# Patient Record
Sex: Female | Born: 1935
Health system: Southern US, Community
[De-identification: ages and names within clinical notes are randomized; demographics above are authoritative.]

## PROBLEM LIST (undated history)

## (undated) DIAGNOSIS — I499 Cardiac arrhythmia, unspecified: Secondary | ICD-10-CM

## (undated) DIAGNOSIS — R142 Eructation: Secondary | ICD-10-CM

## (undated) DIAGNOSIS — M810 Age-related osteoporosis without current pathological fracture: Secondary | ICD-10-CM

## (undated) DIAGNOSIS — J441 Chronic obstructive pulmonary disease with (acute) exacerbation: Secondary | ICD-10-CM

## (undated) DIAGNOSIS — N259 Disorder resulting from impaired renal tubular function, unspecified: Secondary | ICD-10-CM

## (undated) DIAGNOSIS — H409 Unspecified glaucoma: Secondary | ICD-10-CM

## (undated) DIAGNOSIS — Z8711 Personal history of peptic ulcer disease: Secondary | ICD-10-CM

## (undated) DIAGNOSIS — I10 Essential (primary) hypertension: Secondary | ICD-10-CM

## (undated) DIAGNOSIS — J4489 Other specified chronic obstructive pulmonary disease: Secondary | ICD-10-CM

## (undated) DIAGNOSIS — R0600 Dyspnea, unspecified: Secondary | ICD-10-CM

## (undated) DIAGNOSIS — F17200 Nicotine dependence, unspecified, uncomplicated: Secondary | ICD-10-CM

## (undated) DIAGNOSIS — E119 Type 2 diabetes mellitus without complications: Secondary | ICD-10-CM

## (undated) DIAGNOSIS — R0902 Hypoxemia: Secondary | ICD-10-CM

## (undated) DIAGNOSIS — J449 Chronic obstructive pulmonary disease, unspecified: Secondary | ICD-10-CM

## (undated) DIAGNOSIS — H3530 Unspecified macular degeneration: Secondary | ICD-10-CM

## (undated) HISTORY — DX: Chronic obstructive pulmonary disease with (acute) exacerbation: J44.1

## (undated) HISTORY — PX: ELBOW FRACTURE SURGERY: SHX616

## (undated) HISTORY — DX: Other specified chronic obstructive pulmonary disease: J44.89

## (undated) HISTORY — DX: Unspecified macular degeneration: H35.30

## (undated) HISTORY — DX: Unspecified glaucoma: H40.9

## (undated) HISTORY — DX: Type 2 diabetes mellitus without complications: E11.9

## (undated) HISTORY — DX: Essential (primary) hypertension: I10

## (undated) HISTORY — DX: Nicotine dependence, unspecified, uncomplicated: F17.200

## (undated) HISTORY — DX: Personal history of peptic ulcer disease: Z87.11

## (undated) HISTORY — DX: Chronic obstructive pulmonary disease, unspecified: J44.9

## (undated) HISTORY — DX: Age-related osteoporosis without current pathological fracture: M81.0

## (undated) HISTORY — PX: CATARACT EXTRACTION W/ INTRAOCULAR LENS  IMPLANT, BILATERAL: SHX1307

## (undated) HISTORY — DX: Disorder resulting from impaired renal tubular function, unspecified: N25.9

## (undated) SURGERY — LOWER EXTREMITY ANGIOGRAPHY
Anesthesia: Moderate Sedation | Laterality: Left

---

## 2004-06-13 ENCOUNTER — Encounter: Payer: Self-pay | Admitting: Family Medicine

## 2004-06-13 LAB — CONVERTED CEMR LAB: Pap Smear: NORMAL

## 2006-03-12 ENCOUNTER — Ambulatory Visit: Payer: Self-pay | Admitting: Family Medicine

## 2006-03-28 ENCOUNTER — Ambulatory Visit: Payer: Self-pay | Admitting: Family Medicine

## 2006-06-30 ENCOUNTER — Emergency Department (HOSPITAL_COMMUNITY): Admission: EM | Admit: 2006-06-30 | Discharge: 2006-07-01 | Payer: Self-pay | Admitting: Emergency Medicine

## 2006-11-21 ENCOUNTER — Ambulatory Visit: Payer: Self-pay | Admitting: Family Medicine

## 2006-12-04 ENCOUNTER — Ambulatory Visit: Payer: Self-pay | Admitting: Family Medicine

## 2006-12-04 LAB — CONVERTED CEMR LAB
ALT: 30 units/L (ref 0–40)
AST: 27 units/L (ref 0–37)
Albumin: 4.3 g/dL (ref 3.5–5.2)
Alkaline Phosphatase: 50 units/L (ref 39–117)
BUN: 28 mg/dL — ABNORMAL HIGH (ref 6–23)
Bilirubin, Direct: 0.1 mg/dL (ref 0.0–0.3)
CO2: 29 meq/L (ref 19–32)
Calcium: 9.7 mg/dL (ref 8.4–10.5)
Chloride: 107 meq/L (ref 96–112)
Cholesterol: 218 mg/dL (ref 0–200)
Creatinine, Ser: 1.3 mg/dL — ABNORMAL HIGH (ref 0.4–1.2)
Direct LDL: 75.3 mg/dL
GFR calc Af Amer: 52 mL/min
GFR calc non Af Amer: 43 mL/min
Glucose, Bld: 128 mg/dL — ABNORMAL HIGH (ref 70–99)
HDL: 107.1 mg/dL (ref >39.0)
Phosphorus: 4.9 mg/dL — ABNORMAL HIGH (ref 2.3–4.6)
Potassium: 4.7 meq/L (ref 3.5–5.1)
Sodium: 142 meq/L (ref 135–145)
Total Bilirubin: 0.6 mg/dL (ref 0.3–1.2)
Total CHOL/HDL Ratio: 2
Total Protein: 6.6 g/dL (ref 6.0–8.3)
Triglycerides: 54 mg/dL (ref 0–149)
VLDL: 11 mg/dL (ref 0–40)

## 2006-12-10 ENCOUNTER — Encounter: Payer: Self-pay | Admitting: Family Medicine

## 2006-12-10 DIAGNOSIS — H409 Unspecified glaucoma: Secondary | ICD-10-CM | POA: Insufficient documentation

## 2006-12-10 DIAGNOSIS — J432 Centrilobular emphysema: Secondary | ICD-10-CM

## 2006-12-10 DIAGNOSIS — M810 Age-related osteoporosis without current pathological fracture: Secondary | ICD-10-CM

## 2006-12-10 DIAGNOSIS — H3530 Unspecified macular degeneration: Secondary | ICD-10-CM | POA: Insufficient documentation

## 2006-12-10 DIAGNOSIS — I10 Essential (primary) hypertension: Secondary | ICD-10-CM | POA: Insufficient documentation

## 2006-12-11 ENCOUNTER — Ambulatory Visit: Payer: Self-pay | Admitting: Family Medicine

## 2006-12-11 DIAGNOSIS — J441 Chronic obstructive pulmonary disease with (acute) exacerbation: Secondary | ICD-10-CM

## 2006-12-11 DIAGNOSIS — N259 Disorder resulting from impaired renal tubular function, unspecified: Secondary | ICD-10-CM | POA: Insufficient documentation

## 2006-12-11 DIAGNOSIS — R7303 Prediabetes: Secondary | ICD-10-CM | POA: Insufficient documentation

## 2006-12-12 LAB — CONVERTED CEMR LAB
Creatinine,U: 25.1 mg/dL
Hgb A1c MFr Bld: 6 % (ref 4.6–6.0)
Microalb Creat Ratio: 8 mg/g (ref 0.0–30.0)
Microalb, Ur: 0.2 mg/dL (ref 0.0–1.9)

## 2006-12-18 ENCOUNTER — Telehealth: Payer: Self-pay | Admitting: Family Medicine

## 2007-03-24 ENCOUNTER — Ambulatory Visit: Payer: Self-pay | Admitting: Family Medicine

## 2007-03-25 LAB — CONVERTED CEMR LAB
BUN: 22 mg/dL (ref 6–23)
CO2: 28 meq/L (ref 19–32)
Calcium: 9.4 mg/dL (ref 8.4–10.5)
Chloride: 103 meq/L (ref 96–112)
Creatinine, Ser: 1.1 mg/dL (ref 0.4–1.2)
GFR calc Af Amer: 63 mL/min
GFR calc non Af Amer: 52 mL/min
Glucose, Bld: 121 mg/dL — ABNORMAL HIGH (ref 70–99)
Hgb A1c MFr Bld: 5.7 % (ref 4.6–6.0)
Potassium: 5 meq/L (ref 3.5–5.1)
Sodium: 140 meq/L (ref 135–145)

## 2007-03-27 ENCOUNTER — Encounter (INDEPENDENT_AMBULATORY_CARE_PROVIDER_SITE_OTHER): Payer: Self-pay | Admitting: *Deleted

## 2007-04-01 ENCOUNTER — Ambulatory Visit: Payer: Self-pay | Admitting: Family Medicine

## 2007-05-20 ENCOUNTER — Telehealth: Payer: Self-pay | Admitting: Family Medicine

## 2007-05-27 ENCOUNTER — Ambulatory Visit: Payer: Self-pay | Admitting: Family Medicine

## 2007-06-20 ENCOUNTER — Telehealth: Payer: Self-pay | Admitting: Family Medicine

## 2007-06-23 ENCOUNTER — Ambulatory Visit: Payer: Self-pay | Admitting: Family Medicine

## 2007-06-25 ENCOUNTER — Ambulatory Visit: Payer: Self-pay | Admitting: Family Medicine

## 2007-09-24 ENCOUNTER — Telehealth: Payer: Self-pay | Admitting: Family Medicine

## 2007-09-25 ENCOUNTER — Ambulatory Visit: Payer: Self-pay | Admitting: Family Medicine

## 2007-09-25 LAB — CONVERTED CEMR LAB: Hgb A1c MFr Bld: 5.9 % (ref 4.6–6.0)

## 2007-09-26 ENCOUNTER — Telehealth: Payer: Self-pay | Admitting: Family Medicine

## 2007-09-30 ENCOUNTER — Ambulatory Visit: Payer: Self-pay | Admitting: Family Medicine

## 2007-12-15 ENCOUNTER — Telehealth (INDEPENDENT_AMBULATORY_CARE_PROVIDER_SITE_OTHER): Payer: Self-pay | Admitting: *Deleted

## 2008-01-01 ENCOUNTER — Encounter: Payer: Self-pay | Admitting: Family Medicine

## 2008-01-07 ENCOUNTER — Ambulatory Visit: Payer: Self-pay | Admitting: Family Medicine

## 2008-01-07 ENCOUNTER — Telehealth (INDEPENDENT_AMBULATORY_CARE_PROVIDER_SITE_OTHER): Payer: Self-pay | Admitting: *Deleted

## 2008-01-07 DIAGNOSIS — B029 Zoster without complications: Secondary | ICD-10-CM | POA: Insufficient documentation

## 2008-03-24 ENCOUNTER — Ambulatory Visit: Payer: Self-pay | Admitting: Family Medicine

## 2008-03-24 DIAGNOSIS — E7800 Pure hypercholesterolemia, unspecified: Secondary | ICD-10-CM | POA: Insufficient documentation

## 2008-03-24 LAB — CONVERTED CEMR LAB
ALT: 41 units/L — ABNORMAL HIGH (ref 0–35)
AST: 31 units/L (ref 0–37)
Albumin: 4.2 g/dL (ref 3.5–5.2)
Alkaline Phosphatase: 48 units/L (ref 39–117)
BUN: 20 mg/dL (ref 6–23)
Bilirubin, Direct: 0.1 mg/dL (ref 0.0–0.3)
CO2: 28 meq/L (ref 19–32)
Calcium: 9.5 mg/dL (ref 8.4–10.5)
Chloride: 105 meq/L (ref 96–112)
Cholesterol: 226 mg/dL (ref 0–200)
Creatinine, Ser: 0.9 mg/dL (ref 0.4–1.2)
Creatinine,U: 164 mg/dL
Direct LDL: 77.9 mg/dL
GFR calc Af Amer: 79 mL/min
GFR calc non Af Amer: 66 mL/min
Glucose, Bld: 113 mg/dL — ABNORMAL HIGH (ref 70–99)
HDL: 122.3 mg/dL (ref >39.0)
Hgb A1c MFr Bld: 6.1 % — ABNORMAL HIGH (ref 4.6–6.0)
Microalb Creat Ratio: 6.7 mg/g (ref 0.0–30.0)
Microalb, Ur: 1.1 mg/dL (ref 0.0–1.9)
Potassium: 4.7 meq/L (ref 3.5–5.1)
Sodium: 141 meq/L (ref 135–145)
Total Bilirubin: 1 mg/dL (ref 0.3–1.2)
Total CHOL/HDL Ratio: 1.8
Total Protein: 6.2 g/dL (ref 6.0–8.3)
Triglycerides: 31 mg/dL (ref 0–149)
VLDL: 6 mg/dL (ref 0–40)

## 2008-03-30 ENCOUNTER — Ambulatory Visit: Payer: Self-pay | Admitting: Family Medicine

## 2008-04-05 ENCOUNTER — Telehealth: Payer: Self-pay | Admitting: Family Medicine

## 2008-04-06 ENCOUNTER — Telehealth: Payer: Self-pay | Admitting: Family Medicine

## 2008-04-08 ENCOUNTER — Ambulatory Visit: Payer: Self-pay | Admitting: Family Medicine

## 2008-04-08 LAB — CONVERTED CEMR LAB
OCCULT 1: NEGATIVE
OCCULT 2: NEGATIVE
OCCULT 3: NEGATIVE

## 2008-04-15 ENCOUNTER — Encounter: Payer: Self-pay | Admitting: Family Medicine

## 2008-04-28 ENCOUNTER — Encounter: Payer: Self-pay | Admitting: Family Medicine

## 2008-05-11 ENCOUNTER — Encounter (INDEPENDENT_AMBULATORY_CARE_PROVIDER_SITE_OTHER): Payer: Self-pay | Admitting: *Deleted

## 2008-08-17 ENCOUNTER — Telehealth: Payer: Self-pay | Admitting: Family Medicine

## 2008-08-23 ENCOUNTER — Telehealth: Payer: Self-pay | Admitting: Family Medicine

## 2008-09-14 ENCOUNTER — Encounter (INDEPENDENT_AMBULATORY_CARE_PROVIDER_SITE_OTHER): Payer: Self-pay | Admitting: *Deleted

## 2008-09-29 ENCOUNTER — Ambulatory Visit: Payer: Self-pay | Admitting: Family Medicine

## 2008-09-30 LAB — CONVERTED CEMR LAB
ALT: 29 units/L (ref 0–35)
AST: 27 units/L (ref 0–37)
Albumin: 4.4 g/dL (ref 3.5–5.2)
Alkaline Phosphatase: 53 units/L (ref 39–117)
BUN: 23 mg/dL (ref 6–23)
Bilirubin, Direct: 0.1 mg/dL (ref 0.0–0.3)
CO2: 29 meq/L (ref 19–32)
Calcium: 10.1 mg/dL (ref 8.4–10.5)
Chloride: 103 meq/L (ref 96–112)
Cholesterol: 248 mg/dL (ref 0–200)
Creatinine, Ser: 0.9 mg/dL (ref 0.4–1.2)
Direct LDL: 74.7 mg/dL
GFR calc Af Amer: 79 mL/min
GFR calc non Af Amer: 65 mL/min
Glucose, Bld: 98 mg/dL (ref 70–99)
HDL: 139.1 mg/dL (ref >39.0)
Hgb A1c MFr Bld: 6 % (ref 4.6–6.0)
Potassium: 4.8 meq/L (ref 3.5–5.1)
Sodium: 142 meq/L (ref 135–145)
Total Bilirubin: 1.1 mg/dL (ref 0.3–1.2)
Total Protein: 7 g/dL (ref 6.0–8.3)
Triglycerides: 39 mg/dL (ref 0–149)
VLDL: 8 mg/dL (ref 0–40)

## 2008-10-04 ENCOUNTER — Ambulatory Visit: Payer: Self-pay | Admitting: Family Medicine

## 2008-10-07 ENCOUNTER — Telehealth: Payer: Self-pay | Admitting: Family Medicine

## 2008-10-13 ENCOUNTER — Telehealth: Payer: Self-pay | Admitting: Family Medicine

## 2008-11-29 ENCOUNTER — Telehealth: Payer: Self-pay | Admitting: Family Medicine

## 2008-12-21 ENCOUNTER — Telehealth: Payer: Self-pay | Admitting: Family Medicine

## 2009-01-05 ENCOUNTER — Encounter: Payer: Self-pay | Admitting: Family Medicine

## 2009-03-02 ENCOUNTER — Emergency Department: Payer: Self-pay | Admitting: Emergency Medicine

## 2009-03-04 ENCOUNTER — Ambulatory Visit: Payer: Self-pay

## 2009-03-04 ENCOUNTER — Encounter: Payer: Self-pay | Admitting: Family Medicine

## 2009-03-09 ENCOUNTER — Ambulatory Visit: Payer: Self-pay | Admitting: General Practice

## 2009-04-15 ENCOUNTER — Ambulatory Visit: Payer: Self-pay | Admitting: Family Medicine

## 2009-04-18 LAB — CONVERTED CEMR LAB
ALT: 26 units/L (ref 0–35)
AST: 30 units/L (ref 0–37)
Albumin: 4.4 g/dL (ref 3.5–5.2)
Alkaline Phosphatase: 53 units/L (ref 39–117)
BUN: 23 mg/dL (ref 6–23)
Bilirubin, Direct: 0 mg/dL (ref 0.0–0.3)
CO2: 30 meq/L (ref 19–32)
Calcium: 9.7 mg/dL (ref 8.4–10.5)
Chloride: 105 meq/L (ref 96–112)
Cholesterol: 227 mg/dL — ABNORMAL HIGH (ref 0–200)
Creatinine, Ser: 1 mg/dL (ref 0.4–1.2)
Creatinine,U: 91.5 mg/dL
Direct LDL: 75.2 mg/dL
GFR calc non Af Amer: 57.78 mL/min (ref >60)
Glucose, Bld: 110 mg/dL — ABNORMAL HIGH (ref 70–99)
HDL: 126.9 mg/dL (ref >39.00)
Hgb A1c MFr Bld: 6 % (ref 4.6–6.5)
Microalb Creat Ratio: 1.1 mg/g (ref 0.0–30.0)
Microalb, Ur: 0.1 mg/dL (ref 0.0–1.9)
Potassium: 5.4 meq/L — ABNORMAL HIGH (ref 3.5–5.1)
Sodium: 141 meq/L (ref 135–145)
Total Bilirubin: 1 mg/dL (ref 0.3–1.2)
Total CHOL/HDL Ratio: 2
Total Protein: 6.6 g/dL (ref 6.0–8.3)
Triglycerides: 38 mg/dL (ref 0.0–149.0)
VLDL: 7.6 mg/dL (ref 0.0–40.0)

## 2009-05-04 ENCOUNTER — Ambulatory Visit: Payer: Self-pay | Admitting: Family Medicine

## 2009-05-04 DIAGNOSIS — E875 Hyperkalemia: Secondary | ICD-10-CM | POA: Insufficient documentation

## 2009-05-04 LAB — CONVERTED CEMR LAB
Cholesterol, target level: 200 mg/dL
HDL goal, serum: 40 mg/dL
LDL Goal: 100 mg/dL

## 2009-05-05 LAB — CONVERTED CEMR LAB
BUN: 22 mg/dL (ref 6–23)
CO2: 31 meq/L (ref 19–32)
Calcium: 10.1 mg/dL (ref 8.4–10.5)
Chloride: 105 meq/L (ref 96–112)
Creatinine, Ser: 0.9 mg/dL (ref 0.4–1.2)
GFR calc non Af Amer: 65.25 mL/min (ref >60)
Glucose, Bld: 91 mg/dL (ref 70–99)
Potassium: 4.6 meq/L (ref 3.5–5.1)
Sodium: 142 meq/L (ref 135–145)

## 2009-05-20 ENCOUNTER — Ambulatory Visit: Payer: Self-pay | Admitting: Family Medicine

## 2009-05-24 LAB — FECAL OCCULT BLOOD, GUAIAC: Fecal Occult Blood: NEGATIVE

## 2009-05-24 LAB — CONVERTED CEMR LAB: Fecal Occult Bld: NEGATIVE

## 2009-08-27 ENCOUNTER — Ambulatory Visit: Payer: Self-pay | Admitting: Internal Medicine

## 2009-08-27 DIAGNOSIS — L02419 Cutaneous abscess of limb, unspecified: Secondary | ICD-10-CM

## 2009-08-27 DIAGNOSIS — L03119 Cellulitis of unspecified part of limb: Secondary | ICD-10-CM

## 2009-11-07 ENCOUNTER — Ambulatory Visit: Payer: Self-pay

## 2009-11-10 ENCOUNTER — Ambulatory Visit: Payer: Self-pay | Admitting: Family Medicine

## 2009-11-11 ENCOUNTER — Telehealth: Payer: Self-pay | Admitting: Family Medicine

## 2010-02-02 ENCOUNTER — Ambulatory Visit: Payer: Self-pay | Admitting: Ophthalmology

## 2010-02-07 ENCOUNTER — Ambulatory Visit: Payer: Self-pay | Admitting: Ophthalmology

## 2010-04-18 ENCOUNTER — Telehealth: Payer: Self-pay | Admitting: Family Medicine

## 2010-04-21 ENCOUNTER — Ambulatory Visit: Payer: Self-pay | Admitting: Family Medicine

## 2010-04-22 LAB — CONVERTED CEMR LAB
ALT: 36 units/L — ABNORMAL HIGH (ref 0–35)
AST: 34 units/L (ref 0–37)
BUN: 20 mg/dL (ref 6–23)
CO2: 24 meq/L (ref 19–32)
Chloride: 106 meq/L (ref 96–112)
Direct LDL: 84.3 mg/dL
Glucose, Bld: 106 mg/dL — ABNORMAL HIGH (ref 70–99)
Hgb A1c MFr Bld: 6.1 % (ref 4.6–6.5)
Potassium: 4.5 meq/L (ref 3.5–5.1)
Total Bilirubin: 0.9 mg/dL (ref 0.3–1.2)
VLDL: 8.2 mg/dL (ref 0.0–40.0)

## 2010-04-26 ENCOUNTER — Ambulatory Visit: Payer: Self-pay | Admitting: Family Medicine

## 2010-04-26 DIAGNOSIS — R809 Proteinuria, unspecified: Secondary | ICD-10-CM | POA: Insufficient documentation

## 2010-04-28 ENCOUNTER — Telehealth: Payer: Self-pay | Admitting: Family Medicine

## 2010-05-03 ENCOUNTER — Ambulatory Visit: Payer: Self-pay | Admitting: Family Medicine

## 2010-05-04 ENCOUNTER — Ambulatory Visit: Payer: Self-pay | Admitting: Family Medicine

## 2010-05-04 LAB — CONVERTED CEMR LAB
BUN: 23 mg/dL (ref 6–23)
CO2: 28 meq/L (ref 19–32)
Chloride: 104 meq/L (ref 96–112)
Chloride: 105 meq/L (ref 96–112)
Creatinine, Ser: 1 mg/dL (ref 0.4–1.2)
GFR calc non Af Amer: 63.44 mL/min (ref >60)
Glucose, Bld: 97 mg/dL (ref 70–99)
Potassium: 4.9 meq/L (ref 3.5–5.1)

## 2010-06-23 ENCOUNTER — Telehealth: Payer: Self-pay | Admitting: Family Medicine

## 2010-06-23 ENCOUNTER — Ambulatory Visit: Payer: Self-pay | Admitting: Internal Medicine

## 2010-06-29 ENCOUNTER — Telehealth: Payer: Self-pay | Admitting: Family Medicine

## 2010-08-18 ENCOUNTER — Encounter: Payer: Self-pay | Admitting: Family Medicine

## 2010-08-18 ENCOUNTER — Inpatient Hospital Stay (HOSPITAL_COMMUNITY)
Admission: EM | Admit: 2010-08-18 | Discharge: 2010-08-19 | Payer: Self-pay | Source: Home / Self Care | Attending: Internal Medicine | Admitting: Internal Medicine

## 2010-08-18 ENCOUNTER — Ambulatory Visit
Admission: RE | Admit: 2010-08-18 | Discharge: 2010-08-18 | Payer: Self-pay | Source: Home / Self Care | Attending: Family Medicine | Admitting: Family Medicine

## 2010-08-18 LAB — APTT: aPTT: 30 seconds (ref 24–37)

## 2010-08-18 LAB — POCT CARDIAC MARKERS
CKMB, poc: <1 ng/mL — ABNORMAL LOW (ref 1.0–8.0)
Myoglobin, poc: 183 ng/mL (ref 12–200)
Troponin i, poc: 0.28 ng/mL (ref 0.00–0.09)

## 2010-08-18 LAB — BASIC METABOLIC PANEL
BUN: 25 mg/dL — ABNORMAL HIGH (ref 6–23)
CO2: 21 mEq/L (ref 19–32)
Calcium: 9.2 mg/dL (ref 8.4–10.5)
Chloride: 103 mEq/L (ref 96–112)
Creatinine, Ser: 1.8 mg/dL — ABNORMAL HIGH (ref 0.4–1.2)
GFR calc Af Amer: 33 mL/min — ABNORMAL LOW (ref >60)
GFR calc non Af Amer: 28 mL/min — ABNORMAL LOW (ref >60)
Glucose, Bld: 176 mg/dL — ABNORMAL HIGH (ref 70–99)
Potassium: 4 mEq/L (ref 3.5–5.1)
Sodium: 137 mEq/L (ref 135–145)

## 2010-08-18 LAB — TROPONIN I: Troponin I: 0.03 ng/mL (ref 0.00–0.06)

## 2010-08-18 LAB — CK TOTAL AND CKMB (NOT AT ARMC)
CK, MB: 1.3 ng/mL (ref 0.3–4.0)
Relative Index: INVALID (ref 0.0–2.5)
Total CK: 38 U/L (ref 7–177)

## 2010-08-18 LAB — MAGNESIUM: Magnesium: 1.9 mg/dL (ref 1.5–2.5)

## 2010-08-18 LAB — PROTIME-INR
INR: 1.16 (ref 0.00–1.49)
Prothrombin Time: 15 seconds (ref 11.6–15.2)

## 2010-08-18 LAB — BRAIN NATRIURETIC PEPTIDE: Pro B Natriuretic peptide (BNP): 69 pg/mL (ref 0.0–100.0)

## 2010-08-25 ENCOUNTER — Ambulatory Visit: Admit: 2010-08-25 | Payer: Self-pay | Admitting: Family Medicine

## 2010-08-25 ENCOUNTER — Ambulatory Visit
Admission: RE | Admit: 2010-08-25 | Discharge: 2010-08-25 | Payer: Self-pay | Source: Home / Self Care | Attending: Family Medicine | Admitting: Family Medicine

## 2010-08-25 DIAGNOSIS — J189 Pneumonia, unspecified organism: Secondary | ICD-10-CM | POA: Insufficient documentation

## 2010-08-25 DIAGNOSIS — J159 Unspecified bacterial pneumonia: Secondary | ICD-10-CM | POA: Insufficient documentation

## 2010-08-28 ENCOUNTER — Telehealth: Payer: Self-pay | Admitting: Family Medicine

## 2010-08-28 LAB — BASIC METABOLIC PANEL
BUN: 22 mg/dL (ref 6–23)
CO2: 21 mEq/L (ref 19–32)
Calcium: 8.7 mg/dL (ref 8.4–10.5)
Chloride: 108 mEq/L (ref 96–112)
Creatinine, Ser: 1.29 mg/dL — ABNORMAL HIGH (ref 0.4–1.2)
GFR calc Af Amer: 49 mL/min — ABNORMAL LOW (ref >60)
GFR calc non Af Amer: 40 mL/min — ABNORMAL LOW (ref >60)
Glucose, Bld: 189 mg/dL — ABNORMAL HIGH (ref 70–99)
Potassium: 4 mEq/L (ref 3.5–5.1)
Sodium: 140 mEq/L (ref 135–145)

## 2010-08-28 LAB — CBC
HCT: 37.7 % (ref 36.0–46.0)
Hemoglobin: 12.7 g/dL (ref 12.0–15.0)
MCH: 30.8 pg (ref 26.0–34.0)
MCHC: 33.7 g/dL (ref 30.0–36.0)
MCV: 91.5 fL (ref 78.0–100.0)
Platelets: 166 10*3/uL (ref 150–400)
RBC: 4.12 MIL/uL (ref 3.87–5.11)
RDW: 13.4 % (ref 11.5–15.5)
WBC: 5.2 10*3/uL (ref 4.0–10.5)

## 2010-08-28 LAB — LIPID PANEL
Cholesterol: 174 mg/dL (ref 0–200)
HDL: 122 mg/dL (ref >39)
LDL Cholesterol: 43 mg/dL (ref 0–99)
Total CHOL/HDL Ratio: 1.4 RATIO
Triglycerides: 47 mg/dL (ref <150)
VLDL: 9 mg/dL (ref 0–40)

## 2010-08-28 LAB — DIFFERENTIAL
Basophils Absolute: 0 10*3/uL (ref 0.0–0.1)
Basophils Relative: 0 % (ref 0–1)
Eosinophils Absolute: 0 10*3/uL (ref 0.0–0.7)
Eosinophils Relative: 0 % (ref 0–5)
Lymphocytes Relative: 15 % (ref 12–46)
Lymphs Abs: 0.8 10*3/uL (ref 0.7–4.0)
Monocytes Absolute: 0.3 10*3/uL (ref 0.1–1.0)
Monocytes Relative: 6 % (ref 3–12)
Neutro Abs: 4.1 10*3/uL (ref 1.7–7.7)
Neutrophils Relative %: 79 % — ABNORMAL HIGH (ref 43–77)

## 2010-08-28 LAB — CARDIAC PANEL(CRET KIN+CKTOT+MB+TROPI)
CK, MB: 2 ng/mL (ref 0.3–4.0)
CK, MB: 2.3 ng/mL (ref 0.3–4.0)
Relative Index: INVALID (ref 0.0–2.5)
Relative Index: INVALID (ref 0.0–2.5)
Total CK: 36 U/L (ref 7–177)
Total CK: 32 U/L (ref 7–177)
Troponin I: 0.02 ng/mL (ref 0.00–0.06)
Troponin I: 0.04 ng/mL (ref 0.00–0.06)

## 2010-08-28 LAB — URINE CULTURE
Colony Count: 8000
Culture  Setup Time: 201201061742

## 2010-08-28 LAB — URINALYSIS, ROUTINE W REFLEX MICROSCOPIC
Hgb urine dipstick: NEGATIVE
Ketones, ur: NEGATIVE mg/dL
Nitrite: POSITIVE — AB
Protein, ur: NEGATIVE mg/dL
Specific Gravity, Urine: 1.021 (ref 1.005–1.030)
Urine Glucose, Fasting: NEGATIVE mg/dL
Urobilinogen, UA: 0.2 mg/dL (ref 0.0–1.0)
pH: 5 (ref 5.0–8.0)

## 2010-08-28 LAB — URINE MICROSCOPIC-ADD ON

## 2010-09-01 ENCOUNTER — Ambulatory Visit: Admit: 2010-09-01 | Payer: Self-pay | Admitting: Family Medicine

## 2010-09-02 NOTE — Consult Note (Signed)
NAMETALONDA, ARTIST                ACCOUNT NO.:  [id number]  MEDICAL RECORD NO.:  [id number]          PATIENT TYPE:  INP  LOCATION:  4739                         FACILITY:  [facility]  PHYSICIAN:  Luis Abed, MD, FACCDATE OF BIRTH:  21-Mar-1934  DATE OF CONSULTATION:  08/18/2010 DATE OF DISCHARGE:                                CONSULTATION   PRIMARY CARE PHYSICIAN:  Kerby Nora, MD  PRIMARY CARDIOLOGIST:  None.  CHIEF COMPLAINT:  Shortness of breath.  HISTORY OF PRESENT ILLNESS:  Misty Ferguson is a 77 year old female with no history of coronary artery disease.  She reports a recent increase in dyspnea on exertion.  She has not had shortness of breath until the last couple of days.  She has noted an upper respiratory infection with sore throat, nasal congestion, weakness, and a cough with light yellow sputum.  She has not been wheezing.  Last p.m., her shortness of breath became worse and she felt like she was wheezing, so she used a nebulizer with slight improvement.  Today, she went to her primary care physician's office and was initially noted to have low O2 saturation. The patient states that she and her husband were still in the office, waiting for home O2 to be set up when she developed presyncope.  She felt extremely weak.  She was rechecked and her blood pressure was low and her O2 saturation was lower as well.  EMS was called and she was treated with oxygen and IV fluids.  In the office, her O2 saturation was 82% on room air.  Currently, she feels much better after IV fluids and on oxygen.  She has no history of chest pain.  PAST MEDICAL HISTORY: 1. Borderline diabetes. 2. Mild chronic renal insufficiency, stage III. 3. COPD. 4. Remote history of tobacco use. 5. Hypertension. 6. Osteoporosis. 7. Remote history of peptic ulcer disease. 8. Macular degeneration and glaucoma.  SURGICAL HISTORY:  She is status post cataract surgery as well as elbow repair after an injury  and tonsillectomy as a child.  ALLERGIES:  No known drug allergies.  CURRENT MEDICATIONS: 1. Fosamax weekly. 2. Spiriva daily. 3. Advair 250/50 b.i.d. 4. Fish oil 1000 mg a day. 5. Ocuvite 50+ daily. 6. Aspirin 81 mg a day. 7. Calcium plus D b.i.d. 8. Albuterol nebulizers p.r.n. 9. Ocuvite eye drops 6-8 times a day. 10.Preservation two tablets daily. 11.Losartan 50 mg a day.  SOCIAL HISTORY:  She lives in [place] with her husband.  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  She has a greater than 50-pack-year history of tobacco use, but quit approximately 6 years ago. She drinks two vodka drinks a day.  She denies drug use.  FAMILY HISTORY:  Not available because she was adopted and has never gotten information on her biological parents.  REVIEW OF SYSTEMS:  Essentially described above.  She has occasional arthralgias.  She has not had fevers, chills or sweats.  She has been coughing, but not wheezing a great deal more than usual and this is controlled by her normal medications.  She has been weak, but has  not had numbness.  She has occasional arthralgias and joint pains.  She denies melena and is not having ongoing reflux symptoms.  Full 14-point review of systems is otherwise negative except as stated in the HPI.  PHYSICAL EXAMINATION:  VITAL SIGNS:  Temperature 98.0, blood pressure 110/47, pulse 92, respiratory rate 20, O2 saturation 95% on 2 liters. GENERAL:  She is a slender, well-developed white female and in no acute distress on O2. HEENT:  Normal. NECK:  There is no lymphadenopathy, thyromegaly, bruit or JVD noted. CV:  Her heart is regular in rate and rhythm with an S1-S2, and no significant murmur, rub or gallop is noted.  Distal pulses are intact in all four extremities. LUNGS:  She has rales bilaterally, right greater than left especially in the bases. SKIN:  She has some chronic changes, but no rashes or acute lesions are noted. ABDOMEN:   Soft and nontender with active bowel sounds. EXTREMITIES:  There is no cyanosis, clubbing or edema noted. MUSCULOSKELETAL:  There is no joint deformity or effusions and no spine or CVA tenderness. NEUROLOGIC:  She is alert and oriented with cranial nerves II through XII grossly intact.  Chest x-ray is pending.  LABORATORY VALUES:  BNP is 69.  Sodium 137, potassium 4.0, chloride 103, CO2 21, BUN 25, creatinine 1.8, glucose 176, magnesium 1.9.  Point-of- care markers show myoglobin 183, MB less than 1.0, troponin-I 0.28.  CK- MB then performed showed a CK of 38 with an MB of 1.3 and a troponin of 0.03.  INR 1.16.  PTT 30.  ABG and CBC with diff is pending.  EKG, is sinus rhythm with a right bundle-branch block.  There was concern for acute ST abnormalities with this, but upon further review of an EKG obtained from June 2011, the right bundle branch block is not an acute change and the ST segments also have no significant changes noted.  IMPRESSION: 1. Misty Ferguson was seen today by Dr. Myrtis Ser, the patient evaluated and the     data reviewed.  She has a right bundle-branch block of unknown     duration, but it has been present for at least 6 months. 2. Chronic obstructive pulmonary disease. 3. Elevated point-of-care markers with a regular troponin within     normal limits.  We feel that her symptoms are not secondary to an acute myocardial infarction, but she has significant cardiac risk factors.  PLAN: 1. Follow her rhythm carefully. 2. Get an old EKG which has been done and shows a right bundle-branch     block. 3. Continue to cycle enzymes.  We will also check an echocardiogram     with further evaluation and treatment depending on the results of     the above testing.   The ER physician has been advised of all of     this and is in agreement with the plan of care.  We will continue     to follow her closely.     Theodore Demark,  PA-C   ______________________________ Luis Abed, MD, [facility]    RB/MEDQ  D:  08/18/2010  T:  08/19/2010  Job:  [id number]  Electronically Signed by Theodore Demark PA-C on 09/01/2010 06:51:30 AM Electronically Signed by Willa Rough MD FACC on 09/02/2010 09:10:20 AM

## 2010-09-05 ENCOUNTER — Ambulatory Visit
Admission: RE | Admit: 2010-09-05 | Discharge: 2010-09-05 | Payer: Self-pay | Source: Home / Self Care | Attending: Family Medicine | Admitting: Family Medicine

## 2010-09-11 ENCOUNTER — Encounter: Payer: Self-pay | Admitting: Family Medicine

## 2010-09-12 NOTE — Assessment & Plan Note (Signed)
Summary: acute copd excerbation   Vital Signs:  Patient profile:   77 year old female Weight:      109.75 pounds O2 Sat:      92 % on Room air Temp:     98.2 degrees F oral Pulse rate:   104 / minute Pulse rhythm:   regular BP sitting:   122 / 82  (left arm) Cuff size:   regular  Vitals Entered By: Selena Batten Dance CMA (AAMA) (June 23, 2010 2:05 PM)  O2 Flow:  Room air CC: COPD exacerbation?   History of Present Illness: CC: COPD exac?  2d h/o increased cough, increased sputum production, clear sputum, increased dyspnea.  No fevers/chills.  No CP/tightness, abd pain, n/v, rashes, myalgias.  h/o COPD x 14 years, on advair and spiriva.  does have alb neb but doesn't notice much improvement.  Last exac was about 1 year ago.  wishes had oxygen at home just in case.  had o/n pulse ox which showed lowest sat drop to 84%, so would qualify for home O2.  No smokers at home.  Current Medications (verified): 1)  Fosamax 70 Mg Tabs (Alendronate Sodium) .... Take 1 Tablet By Mouth Once A Week 2)  Spiriva Handihaler 18 Mcg Caps (Tiotropium Bromide Monohydrate) .... Inhale 1 Capsule By Mouth Once A Day 3)  Advair Diskus 250-50 Mcg/dose  Misc (Fluticasone-Salmeterol) .Marland Kitchen.. 1 Puff Two Times A Day 4)  Fish Oil Concentrate 1000 Mg  Caps (Omega-3 Fatty Acids) .... Take 1 Tablet By Mouth Once A Day 5)  Ocuvite Adult 50+   Caps (Multiple Vitamins-Minerals) .... Take 1 Tablet By Mouth Once A Day 6)  Aspirin 81 Mg  Tabs (Aspirin) .... Take 1 Tablet By Mouth Once A Day 7)  Calcium 600/vitamin D 600-400 Mg-Unit  Tabs (Calcium Carbonate-Vitamin D) .Marland Kitchen.. 1 Tab By Mouth Two Times A Day 8)  Steroid Eye Drops For Shingles 9)  Albuterol Sulfate 1.25 Mg/50ml Nebu (Albuterol Sulfate) .Marland Kitchen.. 1 Treatment Every 6 Hours As Needed Wheeze 10)  Ocuvite Eye Drops .Marland Kitchen.. 6-8 Times Daily 11)  Presurvision .... 2 Tablets Daily 12)  Losartan Potassium 50 Mg Tabs (Losartan Potassium) .... Take 1 Tablet By Mouth Once A  Day  Allergies (verified): No Known Drug Allergies  Past History:  Past Medical History: Last updated: 06/23/2007 Current Problems:  RENAL INSUFFICIENCY, MILD (ICD-588.9) DM, UNCOMPLICATED, TYPE II (ICD-250.00) CHRONIC OBSTRUCTIVE PULMONARY DISEASE, ACUTE EXACERBATION (ICD-491.21) TOBACCO ABUSE REMOTE (45 PYRHX) (ICD-305.1) GLAUCOMA (ICD-365.9) MACULAR DEGENERATION (ICD-362.50) PUD, HX OF IN 1970'S RESOLVED (ICD-V12.71) HYPERTENSION (ICD-401.9) COPD (ICD-496) OSTEOPOROSIS (ICD-733.00)    Past Surgical History: Last updated: 12/10/2006 08/06 PFT'S - SEVERE COPD 01/06 CARDIOLYTE 2002 DEXA - OSTEOPOROSIS  Social History: Last updated: 12/10/2006 Marital Status: Married X 38 YEARS, PREV. 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 No apparent alopecia or balding.  no sinus tenderness Eyes:  PERRLA, EOMI Ears:  TMs clear Nose:  nares clear Mouth:  MMM, no pharyngeal erythema Neck:  no carotid bruit or thyromegaly no cervical or supraclavicular lymphadenopathy  Lungs:  No increased WOB, sounds diminished, no rales, rhonchi, scattered minimal exp wheezes, some tight  air movement Heart:  Normal rate and regular rhythm. S1 and S2 normal without gallop, murmur, click, rub or other extra sounds. Pulses:  2+ rad pulses Skin:  Intact without suspicious lesions or rashes   Impression & Recommendations:  Problem # 1:  CHRONIC OBSTRUCTIVE PULMONARY DISEASE, ACUTE EXACERBATION (ICD-491.21) mild early.  + increased SOB and sputum.  treat with steroids and abx.  red flags to return discussed.  reviewed pulse ox overnight, low of 84%.  would  qualify for nightly O2 use prn.  will send flag to Aram Beecham to resend request for home O2 to home health.  Complete Medication List: 1)  Fosamax 70 Mg Tabs (Alendronate sodium) .... Take 1 tablet by mouth once a week 2)  Spiriva Handihaler 18 Mcg Caps (Tiotropium bromide monohydrate) .... Inhale 1 capsule by mouth once a day 3)  Advair Diskus 250-50 Mcg/dose Misc (Fluticasone-salmeterol) .Marland Kitchen.. 1 puff two times a day 4)  Fish Oil Concentrate 1000 Mg Caps (Omega-3 fatty acids) .... Take 1 tablet by mouth once a day 5)  Ocuvite Adult 50+ Caps (Multiple vitamins-minerals) .... Take 1 tablet by mouth once a day 6)  Aspirin 81 Mg Tabs (Aspirin) .... Take 1 tablet by mouth once a day 7)  Calcium 600/vitamin D 600-400 Mg-unit Tabs (Calcium carbonate-vitamin d) .Marland Kitchen.. 1 tab by mouth two times a day 8)  Steroid Eye Drops For Shingles  9)  Albuterol Sulfate 1.25 Mg/6ml Nebu (Albuterol sulfate) .Marland Kitchen.. 1 treatment every 6 hours as needed wheeze 10)  Ocuvite Eye Drops  .Marland Kitchen.. 6-8 times daily 11)  Presurvision  .... 2 tablets daily 12)  Losartan Potassium 50 Mg Tabs (Losartan potassium) .... Take 1 tablet by mouth once a day 13)  Doxycycline Hyclate 100 Mg Caps (Doxycycline hyclate) .... Take one by mouth two times a day x 10 days 14)  Prednisone 20 Mg Tabs (Prednisone) .... 2 pills daily x 10 days  Patient Instructions: 1)  This sounds like an early COPD flare.  treat with antibiotics and steroids for 10 days.  Use albuterol neb every 6-8 hours for next 1-2 days. 2)  Push fluids and rest this weekend.  3)  If you start having worsening breathing or fevers, please go to urgent care for evaluation. 4)  Good to meet you today, call clinic with questions. Prescriptions: PREDNISONE 20 MG TABS (PREDNISONE) 2 pills daily x 10 days  #20 x 0   Entered and Authorized by:   Eustaquio Boyden  MD   Signed by:   Eustaquio Boyden  MD on 06/23/2010   Method used:   [id number]       [address]       [place], [place]  [address]       Ph: [phone]       Fax: [phone]   RxID:   [id number] DOXYCYCLINE HYCLATE 100 MG CAPS (DOXYCYCLINE HYCLATE) take one by mouth two times a day x 10 days  #20 x 0   Entered and Authorized by:   Eustaquio Boyden  MD   Signed by:   Eustaquio Boyden  MD on 06/23/2010   Method used:   [facility]        [id number]       [address]       [place], [place]  [address]       Ph: [phone]  Fax: [phone]   RxID:   [id number]    Orders Added: 1)  Est. Patient Level III [30865]    Current Allergies (reviewed today): No known allergies

## 2010-09-12 NOTE — Progress Notes (Signed)
Summary: O2 referral..  Phone Note Other Incoming   Caller: [facility]  Summary of Call: Per [facility].Marland KitchenMarland KitchenO2 will not be approved based on the oxygen levels of 92 % and 89 %..Needs to be below 88%. Says need to order a pulse oxiemtry to get qualifed stats   Sp w/ pt says she  does not want to have any type of test. Says she has problems w. her breathing.Marland KitchenMarland KitchenAram Beecham  Initial call taken by: Daine Gip,  April 28, 2010 3:36 PM  Follow-up for Phone Call        Please let pt know what they want to do is have her wear a pulse ox (like in office )overnight while sleeping to detminine if dropping low...should not tax her at all.. I would recommend.  Follow-up by: Kerby Nora MD,  April 28, 2010 4:06 PM  Additional Follow-up for Phone Call Additional follow up Details #1::        Patient would like to wear  the pulse ox at night Additional Follow-up by: Benny Lennert CMA Duncan Dull),  April 28, 2010 4:11 PM    Additional Follow-up for Phone Call Additional follow up Details #2::    referral complete and in cynthia's hands now Follow-up by: Benny Lennert CMA Duncan Dull),  May 01, 2010 1:05 PM

## 2010-09-12 NOTE — Progress Notes (Signed)
Summary: cough,congestion   Phone Note Call from Patient Call back at [facility] Phone [phone]   Caller: Patient Call For: Misty Nora MD Summary of Call: Patient says that she has congestion, sore throat, a little heaviness in her lungs. She says that with her having COPD she doesn't want it to get any worse. She is asking if you could call her in a zpak and prednisone. I told her that she would need an office visit, I offered her an appt w/ Dr. Reece Agar since your schedule is full, but she wanted me to ask if you couldl just call it in since you know her history. . [facility] [address] Patient also states that she never heard anything back from when she wore the pulse ox during the night. Please advise.  Initial call taken by: Melody Comas,  June 23, 2010 9:03 AM  Follow-up for Phone Call        She likely doies have COPD exacerbation, but we want to make sure she is not worse than she thinks given age and severity of illness. Fo her own safety, I think she needs to have an evaluation with Dr. Reece Agar.  Follow-up by: Misty Nora MD,  June 23, 2010 9:23 AM  Additional Follow-up for Phone Call Additional follow up Details #1::        Appt made with dr Reece Agar at 2 pm today Additional Follow-up by: Benny Lennert CMA Duncan Dull),  June 23, 2010 9:39 AM

## 2010-09-12 NOTE — Progress Notes (Signed)
Summary: Request lab work  Phone Note Call from Patient Call back at [facility] Phone [phone]   Caller: Patient Call For: Kerby Nora MD Summary of Call: Patient is scheduled for an appointment with you on 04/26/10 and she would like to have lab work prior to the visit. Plese advise.  Initial call taken by: Sydell Axon LPN,  April 18, 2010 2:12 PM  Follow-up for Phone Call        Dx 250.00, microalbumin, A1C, CMET, lipids Follow-up by: Kerby Nora MD,  April 18, 2010 2:28 PM  Additional Follow-up for Phone Call Additional follow up Details #1::        Lab appt scheduled for friday.  Additional Follow-up by: Melody Comas,  April 18, 2010 5:27 PM

## 2010-09-12 NOTE — Procedures (Signed)
Summary: Oximetry Report/[facility]  Oximetry Report/[facility]   Imported By: Maryln Gottron 05/12/2010 11:17:23  _____________________________________________________________________  External Attachment:    Type:   Image     Comment:   External Document

## 2010-09-12 NOTE — Assessment & Plan Note (Signed)
Summary: ? BRONCHITIS   Vital Signs:  Patient profile:   77 year old female Height:      61.75 inches Weight:      109.13 pounds BMI:     20.19 O2 Sat:      95 % on Room air Temp:     97.6 degrees F oral Pulse rate:   100 / minute Pulse rhythm:   regular Resp:     16 per minute BP sitting:   122 / 70  (left arm) Cuff size:   regular  Vitals Entered By: Delilah Shan CMA Duncan Dull) (November 10, 2009 2:32 PM)  O2 Flow:  Room air CC: ? bronchitis   History of Present Illness: 77 yo with h/o COPD here with 4 days of worsening productive cough, wheezing, shortness of breath. No fevers or chills. No chest pain.  PMH reviewed- has had multiple COPD exacerbations requiring prednisone tapers and ER visits.  Current Medications (verified): 1)  Fosamax 70 Mg Tabs (Alendronate Sodium) .... Take 1 Tablet By Mouth Once A Week 2)  Spiriva Handihaler 18 Mcg Caps (Tiotropium Bromide Monohydrate) .... Inhale 1 Capsule By Mouth Once A Day 3)  Nifedipine 60 Mg Tb24 (Nifedipine) .... Take 1 Tablet By Mouth Once A Day 4)  Advair Diskus 250-50 Mcg/dose  Misc (Fluticasone-Salmeterol) .Marland Kitchen.. 1 Puff Two Times A Day 5)  Fish Oil Concentrate 1000 Mg  Caps (Omega-3 Fatty Acids) .... Take 1 Tablet By Mouth Once A Day 6)  Ocuvite Adult 50+   Caps (Multiple Vitamins-Minerals) .... Take 1 Tablet By Mouth Once A Day 7)  Aspirin 81 Mg  Tabs (Aspirin) .... Take 1 Tablet By Mouth Once A Day 8)  Calcium 600/vitamin D 600-400 Mg-Unit  Tabs (Calcium Carbonate-Vitamin D) .Marland Kitchen.. 1 Tab By Mouth Two Times A Day 9)  Steroid Eye Drops For Shingles 10)  Albuterol Sulfate 1.25 Mg/62ml Nebu (Albuterol Sulfate) .Marland Kitchen.. 1 Treatment Every 6 Hours As Needed Wheeze 11)  Levaquin 500 Mg Tabs (Levofloxacin) .... Take 1 Tab By Mouth Daily X 7 Days 12)  Prednisone 20 Mg Tabs (Prednisone) .... 3 Tabs By Mouth Daily X 3 Days, 2 Tabs By Mouth X 3 Days, 1 Tab By Mouth X 3 Days,  1/2 By Mouth X 2 Days  Allergies (verified): No Known Drug  Allergies  Review of Systems      See HPI General:  Denies chills and fever. CV:  Denies chest pain or discomfort. Resp:  Complains of shortness of breath, sputum productive, and wheezing.  Physical Exam  General:  alert.  NAD Tachycardic, no increased WOB Mouth:  MMM Lungs:  No increased WOB, sounds diminished, no rales, rhonchi, scattered exp wheezes Heart:  Normal rate and regular rhythm. S1 and S2 normal without gallop, murmur, click, rub or other extra sounds. Psych:  Oriented X3, memory intact for recent and remote, and normally interactive.     Impression & Recommendations:  Problem # 1:  CHRONIC OBSTRUCTIVE PULMONARY DISEASE, ACUTE EXACERBATION (ICD-491.21) Assessment New Given complex medical history and tachycardia, will go ahead and start broad spectrum abx and prednsione taper.  Advised to call tomorrow with update of condition.  Complete Medication List: 1)  Fosamax 70 Mg Tabs (Alendronate sodium) .... Take 1 tablet by mouth once a week 2)  Spiriva Handihaler 18 Mcg Caps (Tiotropium bromide monohydrate) .... Inhale 1 capsule by mouth once a day 3)  Nifedipine 60 Mg Tb24 (Nifedipine) .... Take 1 tablet by mouth once a day 4)  Advair  Diskus 250-50 Mcg/dose Misc (Fluticasone-salmeterol) .Marland Kitchen.. 1 puff two times a day 5)  Fish Oil Concentrate 1000 Mg Caps (Omega-3 fatty acids) .... Take 1 tablet by mouth once a day 6)  Ocuvite Adult 50+ Caps (Multiple vitamins-minerals) .... Take 1 tablet by mouth once a day 7)  Aspirin 81 Mg Tabs (Aspirin) .... Take 1 tablet by mouth once a day 8)  Calcium 600/vitamin D 600-400 Mg-unit Tabs (Calcium carbonate-vitamin d) .Marland Kitchen.. 1 tab by mouth two times a day 9)  Steroid Eye Drops For Shingles  10)  Albuterol Sulfate 1.25 Mg/58ml Nebu (Albuterol sulfate) .Marland Kitchen.. 1 treatment every 6 hours as needed wheeze 11)  Levaquin 500 Mg Tabs (Levofloxacin) .... Take 1 tab by mouth daily x 7 days 12)  Prednisone 20 Mg Tabs (Prednisone) .... 3 tabs by mouth  daily x 3 days, 2 tabs by mouth x 3 days, 1 tab by mouth x 3 days,  1/2 by mouth x 2 days Prescriptions: LEVAQUIN 500 MG TABS (LEVOFLOXACIN) Take 1 tab by mouth daily x 7 days  #7 x 0   Entered and Authorized by:   Ruthe Mannan MD   Signed by:   Ruthe Mannan MD on 11/10/2009   Method used:   [facility]        [id number]       [address]       [place], [place]  [address]       Ph: [phone] or [phone]       Fax: [phone]   RxID:   [id number] PREDNISONE 20 MG TABS (PREDNISONE) 3 tabs by mouth daily x 3 days, 2 tabs by mouth x 3 days, 1 tab by mouth x 3 days,  1/2 by mouth x 2 days  #20 x 0   Entered and Authorized by:   Ruthe Mannan MD   Signed by:   Ruthe Mannan MD on 11/10/2009   Method used:   [facility]        [facility] [address] [facility]       [address]       [place], [place]  [address]       Ph: [phone] or [phone]       Fax: [phone]   RxID:   [id number] LEVAQUIN 500 MG TABS (LEVOFLOXACIN) Take 1 tab by mouth daily x 7 days  #7 x 0   Entered and Authorized by:   Ruthe Mannan MD   Signed by:   Ruthe Mannan MD on 11/10/2009   Method used:   Electronically to        [id number] (retail)       [address] [place], [place]  [address]       Ph: [phone]       Fax: [phone]   RxID:   [id number]   Current Allergies (reviewed today): No known allergies

## 2010-09-12 NOTE — Progress Notes (Signed)
Summary: requests cough medicine  Phone Note Call from Patient   Caller: Patient Call For: Kerby Nora MD Summary of Call: Pt was seen yesterday for bronchitis, she is still coughing- had a rough night last night- and is asking if cough [facility] be [facility]. Initial call taken by: Lowella Petties CMA,  November 11, 2009 4:30 PM  Follow-up for Phone Call        [facility]  Called patient at home got no answer or machine.  Follow-up by: Linde Gillis CMA Duncan Dull),  November 11, 2009 5:26 PM    New/Updated Medications: HYDROCODONE-HOMATROPINE 5-1.5 MG/5ML SYRP (HYDROCODONE-HOMATROPINE) 1 tsp by mouth at bedtime as needed cough Prescriptions: HYDROCODONE-HOMATROPINE 5-1.5 MG/5ML SYRP (HYDROCODONE-HOMATROPINE) 1 tsp by mouth at bedtime as needed cough  #8 oz x 0   Entered and Authorized by:   Kerby Nora MD   Signed by:   Kerby Nora MD on 11/11/2009   Method used:   Telephoned to ...       [facility] [id number]* (retail)       [address] [place], [place]  [address]       Ph: [phone]       Fax: [phone]   RxID:   [id number]

## 2010-09-12 NOTE — Assessment & Plan Note (Signed)
Summary: Misty Ferguson/DIABETIC/INFECTION...AS.   Vital Signs:  Patient profile:   77 year old female Weight:      112 pounds O2 Sat:      92 % on Room air Temp:     97.0 degrees F oral Pulse rate:   70 / minute BP sitting:   126 / 74  (left arm)  Vitals Entered By: Doristine Devoid (August 27, 2009 12:01 PM)  O2 Flow:  Room air CC: R lower leg wound Comments -hit on edge of stove 3 wks ago, now redness and oozing -some swellen around area    History of Present Illness: about 3 weeks ago, she ran into open oven door has had open area since then using peroxide and topical antibiotic  noted increasing swelling around the ankle foot swollen this AM also some pain but not bad  No fever  Hasn't felt sick  Allergies: No Known Drug Allergies  Past History:  Past medical, surgical, family and social histories (including risk factors) reviewed for relevance to current acute and chronic problems.  Past Medical History: Reviewed history from 06/23/2007 and no changes required. Current Problems:  RENAL INSUFFICIENCY, MILD (ICD-588.9) DM, UNCOMPLICATED, TYPE II (ICD-250.00) CHRONIC OBSTRUCTIVE PULMONARY DISEASE, ACUTE EXACERBATION (ICD-491.21) TOBACCO ABUSE REMOTE (45 PYRHX) (ICD-305.1) GLAUCOMA (ICD-365.9) MACULAR DEGENERATION (ICD-362.50) PUD, HX OF IN 1970'S RESOLVED (ICD-V12.71) HYPERTENSION (ICD-401.9) COPD (ICD-496) OSTEOPOROSIS (ICD-733.00)    Past Surgical History: Reviewed history from 12/10/2006 and no changes required. 08/06 PFT'S - SEVERE COPD 01/06 CARDIOLYTE 2002 DEXA - OSTEOPOROSIS  Family History: Reviewed history from 12/11/2006 and no changes required. ADOPTED  Social History: Reviewed history from 12/10/2006 and no changes required. Marital Status: Married X 38 YEARS, PREV. 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  NAD Extremities:  mild edema in foot and ankle on right Skin:  ulcer on anterior lower calf on right green, non healing eschar 3x2 cm size  ~1cm surrounding erythema with mild warmth no sig tenderness   Impression & Recommendations:  Problem # 1:  CELLULITIS, RIGHT LEG (ICD-682.6) Assessment New  with ulcer high suspicion for MRSA  will treat with silvadene and clindamycin  early follow up if not improving  Her updated medication list for this problem includes:    Clindamycin Hcl 300 Mg Caps (Clindamycin hcl) .Marland Kitchen... 1 by mouth three times a day for skin infection  Complete Medication List: 1)  Fosamax 70 Mg Tabs (Alendronate sodium) .... Take 1 tablet by mouth once a week 2)  Spiriva Handihaler 18 Mcg Caps (Tiotropium bromide monohydrate) .... Inhale 1 capsule by mouth once a day 3)  Nifedipine 60 Mg Tb24 (Nifedipine) .... Take 1 tablet by mouth once a day 4)  Advair Diskus 250-50 Mcg/dose Misc (Fluticasone-salmeterol) .Marland Kitchen.. 1 puff two times a day 5)  Fish Oil Concentrate 1000 Mg Caps (Omega-3 fatty acids) .... Take 1 tablet by mouth once a day 6)  Ocuvite Adult 50+ Caps (Multiple vitamins-minerals) .... Take 1 tablet by mouth once a day 7)  Aspirin 81 Mg Tabs (Aspirin) .... Take 1 tablet by mouth once a day 8)  Calcium 600/vitamin D 600-400 Mg-unit Tabs (Calcium carbonate-vitamin d) .Marland KitchenMarland KitchenMarland Kitchen 1  tab by mouth two times a day 9)  Steroid Eye Drops For Shingles  10)  Albuterol Sulfate 1.25 Mg/1ml Nebu (Albuterol sulfate) .Marland Kitchen.. 1 treatment every 6 hours as needed wheeze 11)  Silver Sulfadiazine 1 % Crea (Silver sulfadiazine) .... Apply to leg ulcer daily till healed 12)  Clindamycin Hcl 300 Mg Caps (Clindamycin hcl) .Marland Kitchen.. 1 by mouth three times a day for skin infection  Patient Instructions: 1)  Please  schedule a follow-up appointment as needed .  Prescriptions: CLINDAMYCIN HCL 300 MG CAPS (CLINDAMYCIN HCL) 1 by mouth three times a day for skin infection  #30 x 0   Entered and Authorized by:   Cindee Salt MD   Signed by:   Cindee Salt MD on 08/27/2009   Method used:   [facility] to        [facility] [id number] (retail)       [address]       [place], [place]  [address]       Ph: [phone] or [phone]       Fax: [phone]   RxID:   [id number] SILVER SULFADIAZINE 1 % CREA (SILVER SULFADIAZINE) apply to leg ulcer daily till healed  #111ml x 0   Entered and Authorized by:   Cindee Salt MD   Signed by:   Cindee Salt MD on 08/27/2009   Method used:   [facility]        [facility] (retail)       [address]       [place], [place]  [address]       Ph: [phone] or [phone]       Fax: [phone]   RxID:   [id number]

## 2010-09-12 NOTE — Assessment & Plan Note (Signed)
Summary: F/U,FLU SHOT/CLE   Vital Signs:  Patient profile:   77 year old female Height:      61.75 inches Weight:      106.0 pounds BMI:     19.62 O2 Sat:      92 % on Room air Temp:     97.6 degrees F oral Pulse rate:   96 / minute Pulse rhythm:   regular BP sitting:   110 / 70  (left arm) Cuff size:   regular  Vitals Entered By: Benny Lennert CMA Duncan Dull) (April 26, 2010 11:16 AM)  O2 Flow:  Room air   History of Present Illness: Chief complaint follow up and flu shot  HTN, well controlled..but new microalbumin  High cholesterol..LDL at goal <100 on fish oil and diet  DM, well controlled  COPD mildly progressive on Spiriva and Advair. Over summer in hot weather..increase SOB. Notes increase in SOB when active...requests O2 portable tank. Now can walk only 3 miles a day..but can only do 1 mile at a time.  Had eye surgery this past summer. Shingles in left eye in past 2 years ago...damaged cornea.  Dyspepsia History:      The patient has a prior history of documented ulcer disease.    Problems Prior to Update: 1)  Cellulitis, Right Leg  (ICD-682.6) 2)  Hyperkalemia  (ICD-276.7) 3)  Need Prophylactic Vaccination&inoculation Flu  (ICD-V04.81) 4)  Special Screening Malig Neoplasms Other Sites  (ICD-V76.49) 5)  Other Screening Mammogram  (ICD-V76.12) 6)  Hypercholesterolemia  (ICD-272.0) 7)  Herpes Zoster  (ICD-053.9) 8)  Renal Insufficiency, Mild  (ICD-588.9) 9)  Dm, Uncomplicated, Type II  (ICD-250.00) 10)  Chronic Obstructive Pulmonary Disease, Acute Exacerbation  (ICD-491.21) 11)  Tobacco Abuse Remote (45 PYRHX)  (ICD-305.1) 12)  Glaucoma  (ICD-365.9) 13)  Macular Degeneration  (ICD-362.50) 14)  Pud, Hx of in 1970's Resolved  (ICD-V12.71) 15)  Hypertension  (ICD-401.9) 16)  COPD  (ICD-496) 17)  Osteoporosis  (ICD-733.00)  Current Medications (verified): 1)  Fosamax 70 Mg Tabs (Alendronate Sodium) .... Take 1 Tablet By Mouth Once A Week 2)  Spiriva  Handihaler 18 Mcg Caps (Tiotropium Bromide Monohydrate) .... Inhale 1 Capsule By Mouth Once A Day 3)  Nifedipine 60 Mg Tb24 (Nifedipine) .... Take 1 Tablet By Mouth Once A Day 4)  Advair Diskus 250-50 Mcg/dose  Misc (Fluticasone-Salmeterol) .Marland Kitchen.. 1 Puff Two Times A Day 5)  Fish Oil Concentrate 1000 Mg  Caps (Omega-3 Fatty Acids) .... Take 1 Tablet By Mouth Once A Day 6)  Ocuvite Adult 50+   Caps (Multiple Vitamins-Minerals) .... Take 1 Tablet By Mouth Once A Day 7)  Aspirin 81 Mg  Tabs (Aspirin) .... Take 1 Tablet By Mouth Once A Day 8)  Calcium 600/vitamin D 600-400 Mg-Unit  Tabs (Calcium Carbonate-Vitamin D) .Marland Kitchen.. 1 Tab By Mouth Two Times A Day 9)  Steroid Eye Drops For Shingles 10)  Albuterol Sulfate 1.25 Mg/72ml Nebu (Albuterol Sulfate) .Marland Kitchen.. 1 Treatment Every 6 Hours As Needed Wheeze 11)  Ocuvite Eye Drops .Marland Kitchen.. 6-8 Times Daily 12)  Presurvision .... 2 Tablets Daily  Allergies (verified): No Known Drug Allergies  Past History:  Past medical, surgical, family and social histories (including risk factors) reviewed, and no changes noted (except as noted below).  Past Medical History: Reviewed history from 06/23/2007 and no changes required. Current Problems:  RENAL INSUFFICIENCY, MILD (ICD-588.9) DM, UNCOMPLICATED, TYPE II (ICD-250.00) CHRONIC OBSTRUCTIVE PULMONARY DISEASE, ACUTE EXACERBATION (ICD-491.21) TOBACCO ABUSE REMOTE (45 PYRHX) (ICD-305.1) GLAUCOMA (ICD-365.9) MACULAR  DEGENERATION (ICD-362.50) PUD, HX OF IN 1970'S RESOLVED (ICD-V12.71) HYPERTENSION (ICD-401.9) COPD (ICD-496) OSTEOPOROSIS (ICD-733.00)    Past Surgical History: Reviewed history from 12/10/2006 and no changes required. 08/06 PFT'S - SEVERE COPD 01/06 CARDIOLYTE 2002 DEXA - OSTEOPOROSIS  Family History: Reviewed history from 12/11/2006 and no changes required. ADOPTED  Social History: Reviewed history from 12/10/2006 and no changes required. Marital Status: Married X 38 YEARS, PREV. 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 EOMI. Perrla. Funduscopic exam benign, without hemorrhages, exudates or papilledema. Vision grossly normal. Ears:  External ear exam shows no significant lesions or deformities.  Otoscopic examination reveals clear canals, tympanic membranes are intact bilaterally without bulging, retraction, inflammation or discharge. Hearing is grossly normal bilaterally. Nose:  External nasal examination shows no deformity or inflammation. Nasal mucosa are pink and moist without lesions or exudates. Mouth:  Oral mucosa and oropharynx without lesions or exudates.  Teeth in good repair. Neck:  no carotid bruit or thyromegaly no cervical or supraclavicular lymphadenopathy  Chest Wall:  No deformities, masses, or tenderness noted. Breasts:  No mass, nodules, thickening, tenderness, bulging, retraction, inflamation, nipple discharge or skin changes noted.   Lungs:  No increased WOB, sounds diminished, no rales, rhonchi, scattered exp wheezes Heart:  Normal rate and regular rhythm. S1 and S2 normal without gallop, murmur, click, rub or other extra sounds. Abdomen:  Bowel sounds positive,abdomen soft and non-tender without masses, organomegaly or hernias noted. Genitalia:  not indicated Pulses:  R and L posterior tibial pulses are full and equal bilaterally  Extremities:  mild edema in foot and ankle on right Neurologic:  No cranial nerve deficits noted. Station and gait are normal.Sensory, motor and coordinative functions appear intact. Skin:  Intact without suspicious lesions or rashes Psych:  Oriented X3, memory intact for recent and remote, and normally interactive.     Impression &  Recommendations:  Problem # 1:  DM, UNCOMPLICATED, TYPE II (ICD-250.00) Well controlled with diet. Encouraged exercise, weight maintanance, healthy eating habits.  Her updated medication list for this problem includes:    Aspirin 81 Mg Tabs (Aspirin) .Marland Kitchen... Take 1 tablet by mouth once a day    Losartan Potassium 50 Mg Tabs (Losartan potassium) .Marland Kitchen... Take 1 tablet by mouth once a day  Problem # 2:  MICROALBUMINURIA (ICD-791.0) Change nifedipine to ACEI to protect the kidney's   Problem # 3:  HYPERCHOLESTEROLEMIA (ICD-272.0) Well controlled. Continue current medication.  Labs Reviewed: SGOT: 34 (04/21/2010)   SGPT: 36 (04/21/2010)  Lipid Goals: Chol Goal: 200 (05/04/2009)   HDL Goal: 40 (05/04/2009)   LDL Goal: 100 (05/04/2009)   TG Goal: 150 (05/04/2009)  Prior 10 Yr Risk Heart Disease: Not enough information (12/11/2006)   HDL:118.30 (04/21/2010), 126.90 (04/15/2009)  LDL:DEL (09/29/2008), DEL (03/24/2008)  Chol:236 (04/21/2010), 227 (04/15/2009)  Trig:41.0 (04/21/2010), 38.0 (04/15/2009)  Problem # 4:  HYPERTENSION (ICD-401.9)  Change meds as in # 2 follow closely.  The following medications were removed from the medication list:    Nifedipine 60 Mg Tb24 (Nifedipine) .Marland Kitchen... Take 1 tablet by mouth once a day Her updated medication list for this problem includes:    Losartan Potassium 50 Mg Tabs (Losartan potassium) .Marland Kitchen... Take 1 tablet by  mouth once a day  BP today: 110/70 Prior BP: 122/70 (11/10/2009)  Prior 10 Yr Risk Heart Disease: Not enough information (12/11/2006)  Labs Reviewed: K+: 4.5 (04/21/2010) Creat: : 0.8 (04/21/2010)   Chol: 236 (04/21/2010)   HDL: 118.30 (04/21/2010)   LDL: DEL (09/29/2008)   TG: 41.0 (04/21/2010)  Problem # 5:  COPD (ICD-496) Chronic progressive... SHe feels like frequent low O2s....we will look into whether she meets qualifications for home O2 portable.  Her updated medication list for this problem includes:    Spiriva Handihaler 18 Mcg Caps  (Tiotropium bromide monohydrate) ..... Inhale 1 capsule by mouth once a day    Advair Diskus 250-50 Mcg/dose Misc (Fluticasone-salmeterol) .Marland Kitchen... 1 puff two times a day    Albuterol Sulfate 1.25 Mg/17ml Nebu (Albuterol sulfate) .Marland Kitchen... 1 treatment every 6 hours as needed wheeze  Problem # 6:  Preventive Health Care (ICD-V70.0) Assessment: Comment Only Reviewed preventive care protocols, scheduled due services, and updated immunizations.    Complete Medication List: 1)  Fosamax 70 Mg Tabs (Alendronate sodium) .... Take 1 tablet by mouth once a week 2)  Spiriva Handihaler 18 Mcg Caps (Tiotropium bromide monohydrate) .... Inhale 1 capsule by mouth once a day 3)  Advair Diskus 250-50 Mcg/dose Misc (Fluticasone-salmeterol) .Marland Kitchen.. 1 puff two times a day 4)  Fish Oil Concentrate 1000 Mg Caps (Omega-3 fatty acids) .... Take 1 tablet by mouth once a day 5)  Ocuvite Adult 50+ Caps (Multiple vitamins-minerals) .... Take 1 tablet by mouth once a day 6)  Aspirin 81 Mg Tabs (Aspirin) .... Take 1 tablet by mouth once a day 7)  Calcium 600/vitamin D 600-400 Mg-unit Tabs (Calcium carbonate-vitamin d) .Marland Kitchen.. 1 tab by mouth two times a day 8)  Steroid Eye Drops For Shingles  9)  Albuterol Sulfate 1.25 Mg/64ml Nebu (Albuterol sulfate) .Marland Kitchen.. 1 treatment every 6 hours as needed wheeze 10)  Ocuvite Eye Drops  .Marland Kitchen.. 6-8 times daily 11)  Presurvision  .... 2 tablets daily 12)  Losartan Potassium 50 Mg Tabs (Losartan potassium) .... Take 1 tablet by mouth once a day  Other Orders: Admin 1st Vaccine ([address]) Flu Vaccine 60yrs + (60454)   Patient Instructions: 1)  Call for nurse visit to get shingle vaccine in next 2 weeks.  2)  Stop nifedipine. 3)  Start losartan 50 mg daily. 4)   Follow BP at home. Call  if greater than 140/90. 5)  Return for BMET in 7-10 days..Dx 401.1 6)  Will look into home O2. 7)  Schedule CPX when able. Prescriptions: LOSARTAN POTASSIUM 50 MG TABS (LOSARTAN POTASSIUM) Take 1 tablet by mouth once a  day  #30 x 11   Entered and Authorized by:   Kerby Nora MD   Signed by:   Kerby Nora MD on 04/26/2010   Method used:   [facility]        [facility] [id number]       [address]       [place], [place]  [address]       Ph: [phone] or [phone]       Fax: [phone]   RxID:   [id number]   Current Allergies (reviewed today): No known allergies               Flu Vaccine Consent Questions     Do you have a history of severe allergic reactions to this vaccine? no    Any prior history of allergic reactions to egg and/or gelatin? no  Do you have a sensitivity to the preservative Thimersol? no    Do you have a past hisIMlbflu tory of Guillan-Barre Syndrome? no    Do you currently have an acute febrile illness? no    Have you ever had a severe reaction to latex? no    Vaccine information given and explained to patient? yes    Are you currently pregnant? no    Lot Number:[id number]   Exp Date:02/10/2011   Site Given  Left Deltoid Herpes Zoster Next Due:  Refused Last Hemoccult Result: normal (05/24/2009 11:48:28 AM) Hemoccult Next Due:  Refused Last Mammogram:  normal (04/22/2008 8:07:46 AM) Mammogram Next Due:  Refused Last Bone Density:  abnormal, osteoporosis (04/28/2008 1:08:25 PM) Bone Density Next Due: Refused

## 2010-09-12 NOTE — Progress Notes (Signed)
Summary: Oxygen requirement  requirement for O2 is at least 5 min overnight of desats <89%.  pt only had 3 min of desat. Eustaquio Boyden  MD  June 29, 2010 5:03 PM   ---- Converted from flag ---- ---- 06/29/2010 4:37 PM, Daine Gip wrote:   ---- 06/29/2010 4:37 PM, Daine Gip wrote: Based on the Medicare Requirements Oxygen Education. Per Dr. Sharen Hones, pt does not qualify for oxgyen...[facility] 06-29-2010  ---- 06/23/2010 2:33 PM, Eustaquio Boyden  MD wrote: can you resend request for home O2 to [facility] again?  (see A/P of today's visit)  would qualify based on o/n sleep study.  thanks! find me if questions. ------------------------------

## 2010-09-14 NOTE — Assessment & Plan Note (Signed)
Summary: zostavax/hmw  Nurse Visit   Allergies: No Known Drug Allergies  Immunizations Administered:  Zostavax # 1:    Vaccine Type: Zostavax    Site: left deltoid    Mfr: Merck    Dose: 0.5 ml    Route: IM    Given by: Benny Lennert CMA (AAMA)    Exp. Date: 03/31/2011    Lot #: [id number]    VIS given: 05/25/05 given September 05, 2010.  Orders Added: 1)  Zoster (Shingles) Vaccine Live [90736] 2)  Admin 1st Vaccine [phone]

## 2010-09-14 NOTE — Assessment & Plan Note (Signed)
Summary: Follow-up per Dr. Reece Agar   Vital Signs:  Patient profile:   77 year old female Height:      61.75 inches Weight:      113.75 pounds BMI:     21.05 O2 Sat:      90 % on Room air Temp:     98.7 degrees F oral Pulse rate:   72 / minute Pulse rhythm:   regular Resp:     18 per minute BP sitting:   130 / 80  (left arm) Cuff size:   regular  Vitals Entered By: Linde Gillis CMA Duncan Dull) (August 25, 2010 8:19 AM)  O2 Flow:  Room air CC: follow up per Dr. Sharen Hones   History of Present Illness:  77 year old lady with COPD exacerbation, hypoxia.Misty Ferguson recently admitted to hospital on 1/[birth year] yo 1/7.  Found to have community aquired PNA and mild renal insufficiency.  Treated with levaquin and prednisone taper as well as nebs.  She continues advair and spiriva.   Today she states she is improving significantly.  No fever, conitnue cough, but improved. Nasal congestion.  Using nebs three times a day.. but forgets a lot. No chest pain. On oxygen daily.... this is helping her a lot.  Renal insufficiency returned to nml at D/C... likely due to dehydration.Misty Ferguson wporking on increasing water intake.   Problems Prior to Update: 1)  Sinusitis, Acute  (ICD-461.9) 2)  Microalbuminuria  (ICD-791.0) 3)  Cellulitis, Right Leg  (ICD-682.6) 4)  Hyperkalemia  (ICD-276.7) 5)  Need Prophylactic Vaccination&inoculation Flu  (ICD-V04.81) 6)  Special Screening Malig Neoplasms Other Sites  (ICD-V76.49) 7)  Other Screening Mammogram  (ICD-V76.12) 8)  Hypercholesterolemia  (ICD-272.0) 9)  Herpes Zoster  (ICD-053.9) 10)  Renal Insufficiency, Mild  (ICD-588.9) 11)  Dm, Uncomplicated, Type II  (ICD-250.00) 12)  Chronic Obstructive Pulmonary Disease, Acute Exacerbation  (ICD-491.21) 13)  Tobacco Abuse Remote (45 PYRHX)  (ICD-305.1) 14)  Glaucoma  (ICD-365.9) 15)  Macular Degeneration  (ICD-362.50) 16)  Pud, Hx of in 1970's Resolved  (ICD-V12.71) 17)  Hypertension  (ICD-401.9) 18)  COPD  (ICD-496) 19)   Osteoporosis  (ICD-733.00)  Current Medications (verified): 1)  Fosamax 70 Mg Tabs (Alendronate Sodium) .... Take 1 Tablet By Mouth Once A Week 2)  Spiriva Handihaler 18 Mcg Caps (Tiotropium Bromide Monohydrate) .... Inhale 1 Capsule By Mouth Once A Day 3)  Advair Diskus 250-50 Mcg/dose  Misc (Fluticasone-Salmeterol) .Misty Ferguson.. 1 Puff Two Times A Day 4)  Fish Oil Concentrate 1000 Mg  Caps (Omega-3 Fatty Acids) .... Take 1 Tablet By Mouth Once A Day 5)  Ocuvite Adult 50+   Caps (Multiple Vitamins-Minerals) .... Take 1 Tablet By Mouth Once A Day 6)  Aspirin 81 Mg  Tabs (Aspirin) .... Take 1 Tablet By Mouth Once A Day 7)  Calcium 600/vitamin D 600-400 Mg-Unit  Tabs (Calcium Carbonate-Vitamin D) .Misty Ferguson.. 1 Tab By Mouth Two Times A Day 8)  Albuterol Sulfate 1.25 Mg/77ml Nebu (Albuterol Sulfate) .Misty Ferguson.. 1 Treatment Every 6 Hours As Needed Wheeze 9)  Ocuvite Eye Drops .Misty Ferguson.. 6-8 Times Daily 10)  Presurvision .... 2 Tablets Daily 11)  Losartan Potassium 50 Mg Tabs (Losartan Potassium) .... Take 1 Tablet By Mouth Once A Day 12)  Levofloxacin 500 Mg Tabs (Levofloxacin) .... Take One Daily For 10 Days  Allergies (verified): No Known Drug Allergies  Past History:  Past medical, surgical, family and social histories (including risk factors) reviewed, and no changes noted (except as noted below).  Past Medical History:  Reviewed history from 06/23/2007 and no changes required. Current Problems:  RENAL INSUFFICIENCY, MILD (ICD-588.9) DM, UNCOMPLICATED, TYPE II (ICD-250.00) CHRONIC OBSTRUCTIVE PULMONARY DISEASE, ACUTE EXACERBATION (ICD-491.21) TOBACCO ABUSE REMOTE (45 PYRHX) (ICD-305.1) GLAUCOMA (ICD-365.9) MACULAR DEGENERATION (ICD-362.50) PUD, HX OF IN 1970'S RESOLVED (ICD-V12.71) HYPERTENSION (ICD-401.9) COPD (ICD-496) OSTEOPOROSIS (ICD-733.00)    Past Surgical History: Reviewed history from 12/10/2006 and no changes required. 08/06 PFT'S - SEVERE COPD 01/06 CARDIOLYTE 2002 DEXA - OSTEOPOROSIS  Family  History: Reviewed history from 12/11/2006 and no changes required. ADOPTED  Social History: Reviewed history from 12/10/2006 and no changes required. Marital Status: Married X 38 YEARS, PREV. 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 CV:  Denies chest pain or discomfort. Resp:  Denies shortness of breath. GI:  Denies abdominal pain. GU:  Denies dysuria.  Physical Exam  General:  thin appearing female in NAD Eyes:  No corneal or conjunctival inflammation noted. EOMI. Perrla. Funduscopic exam benign, without hemorrhages, exudates or papilledema. Vision grossly normal. Ears:  External ear exam shows no significant lesions or deformities.  Otoscopic examination reveals clear canals, tympanic membranes are intact bilaterally without bulging, retraction, inflammation or discharge. Hearing is grossly normal bilaterally. Mouth:  Oral mucosa and oropharynx without lesions or exudates.  Teeth in good repair. Neck:  no carotid bruit or thyromegaly no cervical or supraclavicular lymphadenopathy  Lungs:  decreased breath sounds.  improved air movement from last OV notes...rhonchi in B bases, mild Heart:  Normal rate and regular rhythm. S1 and S2 normal without gallop, murmur, click, rub or other extra sounds. Pulses:  2+ rad pulses Extremities:  + clubbing.  no cyanosis or edema Skin:  Intact without suspicious lesions or rashes   Impression & Recommendations:  Problem # 1:  CHRONIC OBSTRUCTIVE PULMONARY DISEASE, ACUTE EXACERBATION (ICD-491.21) Assessment Improved Continue o2 as needed. Complete antibiotics and prednsione taper. Follow up after antibiotics completed to make sure she is doing well.   Problem # 2:  BACTERIAL PNEUMONIA  (ICD-482.9) Assessment: Improved  Her updated medication list for this problem includes:    Levofloxacin 500 Mg Tabs (Levofloxacin) .Misty Ferguson... Take one daily for 10 days  Complete Medication List: 1)  Fosamax 70 Mg Tabs (Alendronate sodium) .... Take 1 tablet by mouth once a week 2)  Spiriva Handihaler 18 Mcg Caps (Tiotropium bromide monohydrate) .... Inhale 1 capsule by mouth once a day 3)  Advair Diskus 250-50 Mcg/dose Misc (Fluticasone-salmeterol) .Misty Ferguson.. 1 puff two times a day 4)  Fish Oil Concentrate 1000 Mg Caps (Omega-3 fatty acids) .... Take 1 tablet by mouth once a day 5)  Ocuvite Adult 50+ Caps (Multiple vitamins-minerals) .... Take 1 tablet by mouth once a day 6)  Aspirin 81 Mg Tabs (Aspirin) .... Take 1 tablet by mouth once a day 7)  Calcium 600/vitamin D 600-400 Mg-unit Tabs (Calcium carbonate-vitamin d) .Misty Ferguson.. 1 tab by mouth two times a day 8)  Albuterol Sulfate 1.25 Mg/53ml Nebu (Albuterol sulfate) .Misty Ferguson.. 1 treatment every 6 hours as needed wheeze 9)  Ocuvite Eye Drops  .Misty Ferguson.. 6-8 times daily 10)  Presurvision  .... 2 tablets daily 11)  Losartan Potassium 50 Mg Tabs (Losartan potassium) .... Take 1 tablet by mouth once a day 12)  Levofloxacin 500 Mg Tabs (Levofloxacin) .... Take one daily for  10 days  Patient Instructions: 1)  Follow up in 1 week... after antibiotics are done. Continue Oxygen as needed.    Orders Added: 1)  Est. Patient Level III [57322]    Current Allergies (reviewed today): No known allergies

## 2010-09-14 NOTE — Progress Notes (Signed)
Summary: shingles vaccine  Phone Note Call from Patient Message from:  Fax from Pharmacy on August 28, 2010 2:38 PM  Caller: Patient Call For: Kerby Nora MD Summary of Call: Patient would like to come in for shingles vaccine on friday. Already checked with insurance. Is it okay to schedule this? Initial call taken by: Melody Comas,  August 28, 2010 2:39 PM  Follow-up for Phone Call        yes, as long as no fever and lungs still improving.  Follow-up by: Kerby Nora MD,  August 28, 2010 3:03 PM  Additional Follow-up for Phone Call Additional follow up Details #1::        patient advised and appt made.Consuello Masse CMA   Additional Follow-up by: Benny Lennert CMA Duncan Dull),  August 28, 2010 3:10 PM

## 2010-09-14 NOTE — Assessment & Plan Note (Signed)
Summary: PAIN IN STOMACH/CONGESTION/COUGH/RBH   Vital Signs:  Patient profile:   77 year old female Weight:      110.25 pounds O2 Sat:      87 % on Room air Temp:     97.5 degrees F oral Pulse rate:   98 / minute Pulse rhythm:   regular BP sitting:   106 / 64  (left arm) Cuff size:   regular  Vitals Entered By: Selena Batten Dance CMA (AAMA) (August 18, 2010 10:21 AM)  O2 Flow:  Room air  Serial Vital Signs/Assessments:  Comments: 11:00 AM O2 Sat on room air at rest =88% O2 Sat on room air with exhertion =82% O2 Sat on 2 LPM of O2 with exhertion =90% O2 Sat on 2 LPM of O2 at rest =95% By: Selena Batten Dance CMA (AAMA)   CC: Cough, congestion, fatigue, shortness of breath with exhertion   History of Present Illness: CC: cough/congestion  Frustrated because feels needs O2 at home, had 24 hour pulse ox done and didn't qualify.  Today O2 sat 87%.  Feels doesn't need O2 at night, but during day when she's more active.  3d h/o cough, congestion, sore in ribs and back.  OTC med delsym has slowed cough down.  + rattly cough.  Mainly nasal congestion.  + chills.  + myalgias/arthralgias in shoulder and down back, + L rib pain with cough.  No fever documented, no abd pain, n/v/d, rashes.    + husband sick like patient but pt worse.  Quit smoking 6 1/2 years ago.  + history of COPD on spiriva and advair daily.  + increased cough.  feels knows body, doesn't think in lungs.  thinks more sinus congestion issue  seen november with dx COPD exac, treated with doxy and prednisone, states neither helped.   Current Medications (verified): 1)  Fosamax 70 Mg Tabs (Alendronate Sodium) .... Take 1 Tablet By Mouth Once A Week 2)  Spiriva Handihaler 18 Mcg Caps (Tiotropium Bromide Monohydrate) .... Inhale 1 Capsule By Mouth Once A Day 3)  Advair Diskus 250-50 Mcg/dose  Misc (Fluticasone-Salmeterol) .Marland Kitchen.. 1 Puff Two Times A Day 4)  Fish Oil Concentrate 1000 Mg  Caps (Omega-3 Fatty Acids) .... Take 1 Tablet By  Mouth Once A Day 5)  Ocuvite Adult 50+   Caps (Multiple Vitamins-Minerals) .... Take 1 Tablet By Mouth Once A Day 6)  Aspirin 81 Mg  Tabs (Aspirin) .... Take 1 Tablet By Mouth Once A Day 7)  Calcium 600/vitamin D 600-400 Mg-Unit  Tabs (Calcium Carbonate-Vitamin D) .Marland Kitchen.. 1 Tab By Mouth Two Times A Day 8)  Albuterol Sulfate 1.25 Mg/18ml Nebu (Albuterol Sulfate) .Marland Kitchen.. 1 Treatment Every 6 Hours As Needed Wheeze 9)  Ocuvite Eye Drops .Marland Kitchen.. 6-8 Times Daily 10)  Presurvision .... 2 Tablets Daily 11)  Losartan Potassium 50 Mg Tabs (Losartan Potassium) .... Take 1 Tablet By Mouth Once A Day  Allergies (verified): No Known Drug Allergies  Past History:  Past Medical History: Last updated: 06/23/2007 Current Problems:  RENAL INSUFFICIENCY, MILD (ICD-588.9) DM, UNCOMPLICATED, TYPE II (ICD-250.00) CHRONIC OBSTRUCTIVE PULMONARY DISEASE, ACUTE EXACERBATION (ICD-491.21) TOBACCO ABUSE REMOTE (45 PYRHX) (ICD-305.1) GLAUCOMA (ICD-365.9) MACULAR DEGENERATION (ICD-362.50) PUD, HX OF IN 1970'S RESOLVED (ICD-V12.71) HYPERTENSION (ICD-401.9) COPD (ICD-496) OSTEOPOROSIS (ICD-733.00)    Past Surgical History: Last updated: 12/10/2006 08/06 PFT'S - SEVERE COPD 01/06 CARDIOLYTE 2002 DEXA - OSTEOPOROSIS  Social History: Last updated: 12/10/2006 Marital Status: Married X 38 YEARS, PREV. 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 No apparent alopecia or balding.  maxillary sinus tenderness Eyes:  PERRLA, EOMI Ears:  TMs clear Nose:  nares clear Mouth:  MMM, no pharyngeal erythema Neck:  no carotid bruit or thyromegaly no cervical or supraclavicular lymphadenopathy   Lungs:  decreased breath sounds.  tight throughout, not wheezy. Heart:  Normal rate and regular rhythm. S1 and S2 normal without gallop, murmur, click, rub or other extra sounds. Msk:  No deformity or scoliosis noted of thoracic or lumbar spine.   Pulses:  2+ rad pulses Extremities:  + clubbing.  no cyanosis or edema Skin:  Intact without suspicious lesions or rashes   Impression & Recommendations:  Problem # 1:  SINUSITIS, ACUTE (ICD-461.9) in COPDer.  pt refuses CXR, says knows her body and feels just sinus issue.    The following medications were removed from the medication list:    Doxycycline Hyclate 100 Mg Caps (Doxycycline hyclate) .Marland Kitchen... Take one by mouth two times a day x 10 days Her updated medication list for this problem includes:    Levofloxacin 500 Mg Tabs (Levofloxacin) .Marland Kitchen... Take one daily for 10 days  Problem # 2:  COPD (ICD-496) set up with home health for O2, qualifies.  Her updated medication list for this problem includes:    Spiriva Handihaler 18 Mcg Caps (Tiotropium bromide monohydrate) ..... Inhale 1 capsule by mouth once a day    Advair Diskus 250-50 Mcg/dose Misc (Fluticasone-salmeterol) .Marland Kitchen... 1 puff two times a day    Albuterol Sulfate 1.25 Mg/57ml Nebu (Albuterol sulfate) .Marland Kitchen... 1 treatment every 6 hours as needed wheeze  Orders: Home Health Referral (Home Health)  Complete Medication List: 1)  Fosamax 70 Mg Tabs (Alendronate sodium) .... Take 1 tablet by mouth once a week 2)  Spiriva Handihaler 18 Mcg Caps (Tiotropium bromide monohydrate) .... Inhale 1 capsule by mouth once a day 3)  Advair Diskus 250-50 Mcg/dose Misc (Fluticasone-salmeterol) .Marland Kitchen.. 1 puff two times a day 4)  Fish Oil Concentrate 1000 Mg Caps (Omega-3 fatty acids) .... Take 1 tablet by mouth once a day 5)  Ocuvite Adult 50+ Caps (Multiple vitamins-minerals) .... Take 1 tablet by mouth once a day 6)  Aspirin 81 Mg Tabs (Aspirin) .... Take 1 tablet by mouth once a day 7)  Calcium  600/vitamin D 600-400 Mg-unit Tabs (Calcium carbonate-vitamin d) .Marland Kitchen.. 1 tab by mouth two times a day 8)  Albuterol Sulfate 1.25 Mg/46ml Nebu (Albuterol sulfate) .Marland Kitchen.. 1 treatment every 6 hours as needed wheeze 9)  Ocuvite Eye Drops  .Marland Kitchen.. 6-8 times daily 10)  Presurvision  .... 2 tablets daily 11)  Losartan Potassium 50 Mg Tabs (Losartan potassium) .... Take 1 tablet by mouth once a day 12)  Levofloxacin 500 Mg Tabs (Levofloxacin) .... Take one daily for 10 days  Patient Instructions: 1)  take levaquin for sinuses. 2)  return next week with Dr. Ermalene Searing for recheck and to see how oxygen is doing and COPD is doing on oxygen. 3)  we will set you up with oxygen for home use. Prescriptions: LEVOFLOXACIN 500 MG TABS (LEVOFLOXACIN) take one daily for 10 days  #10 x 0   Entered and  Authorized by:   Eustaquio Boyden  MD   Signed by:   Eustaquio Boyden  MD on 08/18/2010   Method used:   Electronically to        [id number] (retail)       [address]       [place], [place]  [address]       Ph: [phone]       Fax: [phone]   RxID:   [id number]    Orders Added: 1)  Home Health Referral [Home Health] 2)  Est. Patient Level III [96789]    Current Allergies (reviewed today): No known allergies  ON WAY OUT TODAY, PT FELT FAINT, FEELING DIAPHORETIC AND NAUSEATED, TROUBLE WITH VISION AS WELL.  FEELING DIZZY.  FAINT PULSE.  NO CHEST PAIN/TIGHTNESS OR NAUSEA.  O2 SAT STAYING 80S ON 2L .  STATES I'M JUST NOT FEELING WELL.  EKG - RBBB, no old to compare.  BP dropped to 90s/60s supine.  continues to feel lightheaded. To ER for eval, cardiac and neuro r/o needed.  no fevers. hypoxic, hypotensive, new EKG changes.  ER for eval.  called [facility]. Eustaquio Boyden  MD  August 18, 2010 12:30 PM

## 2010-09-15 ENCOUNTER — Encounter: Payer: Self-pay | Admitting: Family Medicine

## 2010-09-28 NOTE — Letter (Signed)
Summary: Medical Alert Certificate/[employer]  Medical Alert Certificate/[employer]   Imported By: Maryln Gottron 09/21/2010 14:44:11  _____________________________________________________________________  External Attachment:    Type:   Image     Comment:   External Document

## 2010-09-28 NOTE — Miscellaneous (Signed)
Summary: [facility]   Imported By: Kassie Mends 09/20/2010 10:12:12  _____________________________________________________________________  External Attachment:    Type:   Image     Comment:   External Document

## 2010-12-14 ENCOUNTER — Other Ambulatory Visit: Payer: Self-pay | Admitting: Family Medicine

## 2010-12-29 NOTE — Assessment & Plan Note (Signed)
Misty Ferguson                             [facility] OFFICE NOTE   Misty Ferguson, Misty Ferguson                         MRN:          [id number]  DATE:03/12/2006                            DOB:          July 24, 1934    NEW PATIENT H&P:   CHIEF COMPLAINT:  A 77 year old white female here to establish a new doctor.   HISTORY OF PRESENT ILLNESS:  Misty Ferguson recently moved from [place], [place]  after her husband transferred three months ago.  She is here to establish a  new doctor.  She denies any current problems.  She does state that since she  has been less active since being in [place] that she feels her COPD  has worsened slightly.  She finds that she gets short of breath more  frequently.  She states that she is unable to ride bikes and play tennis as  well as she used to, mainly because of the hills in [place] and the  fact that there is no proximity of tennis courts.  She feels that getting  out of shape is what has made her have slight worsening in her breathing  function.  She uses albuterol once a day as well as Spiriva once a day.   PAST MEDICAL HISTORY:  1.  Osteoporosis.  2.  COPD.  3.  Hypertension.  4.  Peptic ulcer disease in 1970s, now resolved.   HOSPITALIZATIONS/SURGERIES/PROCEDURES:  In August, 2006, pulmonary function  tests:  FEV1/FVC ratio 50% predicted, interpreted as severe obstructive lung  defect, minimal improvement with bronchodilator.   MEDICATIONS:  1.  Nifedipine ER/XL 60 mg daily.  2.  Albuterol 0.83 mg/ml 1 vial q.i.d.  3.  Fosamax 70 mg weekly.  4.  Spiriva 18 mcg hand-haler 1 inhalation daily.   HEALTH MAINTENANCE:  She has not had a mammogram or a Pap smear in the last  year and a half.  She has never had a colonoscopy and is unsure as to when  her last cholesterol panel was.  She also is not sure whether she had a  Pneumovax after age 58.  She does get early influenza vaccines.   REVIEW OF SYSTEMS:   No headaches.  She wear glasses and sees an eye doctor  for macular degeneration and glaucoma.  No chest pain.  No cough.  Occasional shortness of breath with exertion.  No nausea, vomiting,  diarrhea, or constipation.  No GI bleeding.  No nocturia or increased  urinary frequency.  No myalgias.  No arthralgias.   FAMILY HISTORY:  She is unsure of any of her family history because she was  adopted, and her birth mother, who she found, refused to be involved with  her.   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  She has been  married for 38 years and had been previously married once.  She has three  children, two daughters and one son.  Her daughters do have some health  issues, including lupus, fibromyalgia, chronic fatigue, and Raynaud's.  As  far as  exercise, [name] walks frequently.  She used to play tennis and  bike ride a lot more but has been unable to get this set up yet in [place].  Diet:  She loves salt but eats lots of vegetables.  Substance  abuse:  She has a remote tobacco abuse history but significant for 45-pack-  years.  She drinks alcohol daily, sometimes 0 drinks, sometimes up to two  per day, liquor or wine.  She denies IV drug use.   PHYSICAL EXAMINATION:  VITAL SIGNS:  Weight 113, blood pressure 102/62,  pulse 72, temperature 97.5.  Height 61-3/4 inches, putting her BMI around  20.  GENERAL:  A thin-appearing elderly female in no apparent distress.  HEENT:  PERRLA.  Extraocular muscles intact.  Nares patent.  Tympanic  membranes clear.  NECK:  No lymphadenopathy.  No thyromegaly.  CARDIOVASCULAR:  Regular rate and rhythm.  No murmurs, rubs or gallops.  Normal PMI.  Pulses 2+, pedal and radial.  PULMONARY:  Clear to auscultation bilaterally.  No wheezes, rales, or  rhonchi.  No cyanosis.  Mild decrease in air movement through lungs.  Positive forced expiratory wheeze.  ABDOMEN:  Soft and nontender.  Normoactive bowel sounds.  No   hepatosplenomegaly.  MUSCULOSKELETAL:  Strength 5/5 in the upper and lower extremities.  Reflexes  are 2+.  Sensation intact.  NEUROLOGIC:  Cranial nerves II-XII grossly intact.  Alert and oriented x3.   ASSESSMENT/PLAN:  Misty Ferguson is a 77 year old female with fairly severe  chronic obstructive pulmonary disease.  She was instructed to continue  Spiriva and albuterol.  Her hypertension is currently well controlled with  nifedipine, although we discuss encouraging a low salt diet and increasing  exercise back to where she used to be.  In addition, she has osteoporosis  and takes Fosamax 70 mg weekly.  I also recommended a multivitamin, calcium,  and vitamin D 600 mg twice daily.  We will obtain her records from [place],  and she will return in 3-4 weeks to review them. At that point in time, we  can get her caught up in preventative care, such as colonoscopy, Pneumovax,  tetanus, mammogram, and possibly pulmonary function tests if needed, and a  cholesterol panel.  As far as Pap smear, she is nearing the age where she  does not need to continue these.  She would like to perform one more in  three years and then stop.                                   Kerby Nora, MD   AB/MedQ  DD:  03/12/2006  DT:  03/12/2006  Job #:  [id number]

## 2011-01-12 ENCOUNTER — Ambulatory Visit (INDEPENDENT_AMBULATORY_CARE_PROVIDER_SITE_OTHER): Payer: BC Managed Care – PPO | Admitting: Family Medicine

## 2011-01-12 ENCOUNTER — Encounter: Payer: Self-pay | Admitting: Family Medicine

## 2011-01-12 VITALS — BP 140/86 | HR 88 | Temp 97.8°F | Ht 61.0 in | Wt 110.0 lb

## 2011-01-12 DIAGNOSIS — J441 Chronic obstructive pulmonary disease with (acute) exacerbation: Secondary | ICD-10-CM

## 2011-01-12 LAB — HM MAMMOGRAPHY

## 2011-01-12 LAB — HM DIABETES FOOT EXAM

## 2011-01-12 MED ORDER — PREDNISONE 20 MG PO TABS: ORAL_TABLET | ORAL | Status: DC

## 2011-01-12 MED ORDER — LEVOFLOXACIN 500 MG PO TABS: 500.0000 mg | ORAL_TABLET | Freq: Every day | ORAL | Status: AC

## 2011-01-12 NOTE — Assessment & Plan Note (Signed)
New. Will give 5 day course of Levaquin, prednisone taper. The patient indicates understanding of these issues and agrees with the plan.

## 2011-01-12 NOTE — Progress Notes (Signed)
77 year old female, pt of Dr. Ermalene Searing,  with COPD.  H/o hospitalization for COPD exacerbation, hypoxia and PNA earlier this year.    Past week, increased wheezing, cough, congestion. Afebrile.   She continues advair and spiriva.  Using nebs three times a day. No chest pain.  On oxygen daily.  Went to UC on Monday, given Zpack does not feel any better.     The PMH, PSH, Social History, Family History, Medications, and allergies have been reviewed in [facility], and have been updated if relevant.  Review of Systems  General: Denies fatigue and fever.  CV: Denies chest pain or discomfort.  Resp: Denies shortness of breath.  GI: Denies abdominal pain.  GU: Denies dysuria.   Physical Exam  BP 140/86  Pulse 88  Temp(Src) 97.8 F (36.6 C) (Oral)  Ht 5\' 1"  (1.549 m)  Wt 110 lb (49.896 kg)  BMI 20.78 kg/m2 General: thin appearing female in NAD  Eyes: No corneal or conjunctival inflammation noted. EOMI. Perrla. Funduscopic exam benign, without hemorrhages, exudates or papilledema. Vision grossly normal.  Ears: External ear exam shows no significant lesions or deformities. Otoscopic examination reveals clear canals, tympanic membranes are intact bilaterally without bulging, retraction, inflammation or discharge. Hearing is grossly normal bilaterally.  Mouth: Oral mucosa and oropharynx without lesions or exudates. Teeth in good repair.  Neck: no carotid bruit or thyromegaly no cervical or supraclavicular lymphadenopathy  Lungs: decreased breath sounds. Diffuse exp wheezes, basilar rhonchi Heart: Normal rate and regular rhythm. S1 and S2 normal without gallop, murmur, click, rub or other extra sounds.  Pulses: 2+ rad pulses  Extremities: + clubbing. no cyanosis or edema  Skin: Intact without suspicious lesions or rashes

## 2011-03-15 ENCOUNTER — Other Ambulatory Visit: Payer: Self-pay | Admitting: Family Medicine

## 2011-04-16 ENCOUNTER — Other Ambulatory Visit: Payer: Self-pay | Admitting: Family Medicine

## 2011-05-31 ENCOUNTER — Other Ambulatory Visit: Payer: Self-pay | Admitting: Family Medicine

## 2011-06-14 ENCOUNTER — Telehealth: Payer: Self-pay | Admitting: Family Medicine

## 2011-06-14 ENCOUNTER — Other Ambulatory Visit (INDEPENDENT_AMBULATORY_CARE_PROVIDER_SITE_OTHER): Payer: BC Managed Care – PPO

## 2011-06-14 DIAGNOSIS — E7800 Pure hypercholesterolemia, unspecified: Secondary | ICD-10-CM

## 2011-06-14 DIAGNOSIS — N259 Disorder resulting from impaired renal tubular function, unspecified: Secondary | ICD-10-CM

## 2011-06-14 DIAGNOSIS — I10 Essential (primary) hypertension: Secondary | ICD-10-CM

## 2011-06-14 DIAGNOSIS — E875 Hyperkalemia: Secondary | ICD-10-CM

## 2011-06-14 DIAGNOSIS — M810 Age-related osteoporosis without current pathological fracture: Secondary | ICD-10-CM

## 2011-06-14 LAB — COMPREHENSIVE METABOLIC PANEL
Albumin: 4.8 g/dL (ref 3.5–5.2)
BUN: 21 mg/dL (ref 6–23)
CO2: 26 mEq/L (ref 19–32)
Calcium: 10.1 mg/dL (ref 8.4–10.5)
GFR: 57.44 mL/min — ABNORMAL LOW (ref >60.00)
Glucose, Bld: 107 mg/dL — ABNORMAL HIGH (ref 70–99)
Potassium: 4.2 mEq/L (ref 3.5–5.1)
Sodium: 140 mEq/L (ref 135–145)
Total Protein: 7.3 g/dL (ref 6.0–8.3)

## 2011-06-14 NOTE — Telephone Encounter (Signed)
Message copied by Excell Seltzer on Thu Jun 14, 2011  1:04 PM ------      Message from: Alvina Chou      Created: Thu Jun 14, 2011  9:57 AM       Patient is scheduled for CPX labs, please order future labs, Thanks , Terri            Patient already came in this am.

## 2011-06-15 LAB — LIPID PANEL: Cholesterol: 254 mg/dL — ABNORMAL HIGH (ref 0–200)

## 2011-06-16 ENCOUNTER — Other Ambulatory Visit: Payer: Self-pay | Admitting: Family Medicine

## 2011-06-21 ENCOUNTER — Encounter: Payer: BC Managed Care – PPO | Admitting: Family Medicine

## 2011-06-28 ENCOUNTER — Ambulatory Visit (INDEPENDENT_AMBULATORY_CARE_PROVIDER_SITE_OTHER): Payer: BC Managed Care – PPO | Admitting: Family Medicine

## 2011-06-28 ENCOUNTER — Encounter: Payer: Self-pay | Admitting: Family Medicine

## 2011-06-28 DIAGNOSIS — Z23 Encounter for immunization: Secondary | ICD-10-CM

## 2011-06-28 DIAGNOSIS — I10 Essential (primary) hypertension: Secondary | ICD-10-CM

## 2011-06-28 DIAGNOSIS — R809 Proteinuria, unspecified: Secondary | ICD-10-CM

## 2011-06-28 DIAGNOSIS — E875 Hyperkalemia: Secondary | ICD-10-CM

## 2011-06-28 DIAGNOSIS — J449 Chronic obstructive pulmonary disease, unspecified: Secondary | ICD-10-CM

## 2011-06-28 DIAGNOSIS — M810 Age-related osteoporosis without current pathological fracture: Secondary | ICD-10-CM

## 2011-06-28 DIAGNOSIS — E7800 Pure hypercholesterolemia, unspecified: Secondary | ICD-10-CM

## 2011-06-28 DIAGNOSIS — E119 Type 2 diabetes mellitus without complications: Secondary | ICD-10-CM

## 2011-06-28 DIAGNOSIS — Z0000 Encounter for general adult medical examination without abnormal findings: Secondary | ICD-10-CM

## 2011-06-28 MED ORDER — ALBUTEROL SULFATE 1.25 MG/3ML IN NEBU: 1.0000 | INHALATION_SOLUTION | Freq: Four times a day (QID) | RESPIRATORY_TRACT | Status: DC | PRN

## 2011-06-28 MED ORDER — AZITHROMYCIN 250 MG PO TABS: ORAL_TABLET | ORAL | Status: AC

## 2011-06-28 NOTE — Assessment & Plan Note (Signed)
Well controlled on no med. Total is high given very high HDL.

## 2011-06-28 NOTE — Assessment & Plan Note (Signed)
On ARB 

## 2011-06-28 NOTE — Patient Instructions (Addendum)
Work on low [employer] as able. Continue work on daily exercise. Stop fosamax we will recheck in 1-2 years. Likely viral infection, but if not turning corner in next 4-5 days.Lenox Ahr prescription for antibiotics.

## 2011-06-28 NOTE — Assessment & Plan Note (Signed)
HAs been on fosamax for many years... Will stop and recheck DXA in 1-2 years.

## 2011-06-28 NOTE — Progress Notes (Signed)
Subjective:    Patient ID: Misty Ferguson, female    DOB: 1933/10/20, 77 y.o.   MRN: [id number]  HPI I have personally reviewed the Medicare Annual Wellness questionnaire and have noted 1. The patient's medical and social history 2. Their use of alcohol, tobacco or illicit drugs 3. Their current medications and supplements 4. The patient's functional ability including ADL's, fall risks, home safety risks and hearing or visual             impairment. 5. Diet and physical activities 6. Evidence for depression or mood disorders  The patients weight, height, BMI and visual acuity have been recorded in the chart I have made referrals, counseling and provided education to the patient based review of the above and I have provided the pt with a written personalized care plan for preventive services.   Diabetes: Well controlled with diet and lifestyle. Lab Results  Component Value Date   HGBA1C 6.1 04/21/2010  Using medications without difficulties: On no meds. Hypoglycemic episodes: ? Hyperglycemic episodes:? Feet problems:None Blood Sugars averaging:not  checking. eye exam within last year: yes  Hypertension:   At goal on losartan  Using medication without problems or lightheadedness:  None Chest pain with exertion:None Edema:None Short of breath:None Average home BPs:not checking Other issues:  Elevated Cholesterol: LDL at goal <100 on fish oil 1000 mg BID. Using medications without problems: On no rx medication. Diet compliance: Good Exercise:Good, Walking 1-2 a day.  COPD, severe: on advair, spiriva  Last hospitalization 08/2010... Since then doing well Using oxygen as need 2-3 times a week. No need for rescue inhalers in past 6 months. Has access to albuterol in [place] available.  IN last 1-2 weeks.. New cough, clear sputum, different for her. No increase wheeze or SOB.  Seeing accupuncturist for dry eyes.   Review of Systems  Constitutional: Negative for fever, fatigue  and unexpected weight change.  HENT: Negative for ear pain, congestion, sore throat, sneezing, trouble swallowing and sinus pressure.   Eyes: Negative for pain and itching.  Respiratory: Positive for cough and shortness of breath.   Cardiovascular: Negative for chest pain, palpitations and leg swelling.  Gastrointestinal: Negative for nausea, abdominal pain, diarrhea, constipation and blood in stool.  Genitourinary: Negative for dysuria, hematuria, vaginal discharge, difficulty urinating and menstrual problem.  Skin: Negative for rash.  Neurological: Negative for syncope, weakness, light-headedness, numbness and headaches.  Psychiatric/Behavioral: Negative for confusion and dysphoric mood. The patient is not nervous/anxious.        Objective:   Physical Exam  Constitutional: Vital signs are normal. She appears well-developed and well-nourished. She is cooperative.  Non-toxic appearance. She does not appear ill. No distress.  HENT:  Head: Normocephalic.  Right Ear: Hearing, tympanic membrane, external ear and ear canal normal.  Left Ear: Hearing, tympanic membrane, external ear and ear canal normal.  Nose: Nose normal.  Eyes: Conjunctivae, EOM and lids are normal. Pupils are equal, round, and reactive to light. No foreign bodies found.  Neck: Trachea normal and normal range of motion. Neck supple. Carotid bruit is not present. No mass and no thyromegaly present.  Cardiovascular: Normal rate, regular rhythm, S1 normal, S2 normal, normal heart sounds and intact distal pulses.  Exam reveals no gallop.   No murmur heard. Pulmonary/Chest: Effort normal. No respiratory distress. She has decreased breath sounds in the right upper field, the right middle field, the right lower field, the left upper field, the left middle field and the left lower field. She has  no wheezes. She has no rhonchi. She has no rales.       Stable exam   Abdominal: Soft. Normal appearance and bowel sounds are normal. She  exhibits no distension, no fluid wave, no abdominal bruit and no mass. There is no hepatosplenomegaly. There is no tenderness. There is no rebound, no guarding and no CVA tenderness. No hernia.  Genitourinary: Pelvic exam was performed with patient prone.       PAP/ DVE not indicated Pt refuses breast exam.  Lymphadenopathy:    She has no cervical adenopathy.    She has no axillary adenopathy.  Neurological: She is alert. She has normal strength. No cranial nerve deficit or sensory deficit.  Skin: Skin is warm, dry and intact. No rash noted.  Psychiatric: Her speech is normal and behavior is normal. Judgment normal. Her mood appears not anxious. Cognition and memory are normal. She does not exhibit a depressed mood.          Assessment & Plan:  Annual Medicare Wellness: The patient's preventative maintenance and recommended screening tests for an annual wellness exam were reviewed in full today. Brought up to date unless services declined.  Counselled on the importance of diet, exercise, and its role in overall health and mortality. The patient's FH and SH was reviewed, including their home life, tobacco status, and drug and alcohol status.   Vaccines:Has gotten flu, Uptodate with Td, shingles, due for PNA q 5 years (this year due).. Given. Mammogram: last in 2009, not interested in continuing to follow. DEXA: on fosamax (many years about 10 years) last checked 2009... Will stop fosamax, then recehck DXA in 1-2 years. Colon: not interested in continuing to follow. Discussed the risks and benefits of cancer screening... Pt voices understanding an chooses to not proceed with prevention as noted above. No indication for pap/DVE.

## 2011-06-28 NOTE — Assessment & Plan Note (Signed)
Well controlled with lifestyle. Encouraged exercise, weight maintanance, healthy eating habits.  

## 2011-06-28 NOTE — Assessment & Plan Note (Signed)
Well controlled. Continue current medication.  

## 2011-08-13 ENCOUNTER — Encounter: Payer: Self-pay | Admitting: Family Medicine

## 2011-08-13 ENCOUNTER — Ambulatory Visit (INDEPENDENT_AMBULATORY_CARE_PROVIDER_SITE_OTHER): Payer: BC Managed Care – PPO | Admitting: Family Medicine

## 2011-08-13 ENCOUNTER — Ambulatory Visit (INDEPENDENT_AMBULATORY_CARE_PROVIDER_SITE_OTHER)
Admission: RE | Admit: 2011-08-13 | Discharge: 2011-08-13 | Disposition: A | Discharge status: Home / Self Care | Payer: BC Managed Care – PPO | Source: Ambulatory Visit | Attending: Family Medicine | Admitting: Family Medicine

## 2011-08-13 VITALS — BP 110/80 | HR 101 | Temp 98.1°F | Wt 111.8 lb

## 2011-08-13 DIAGNOSIS — J189 Pneumonia, unspecified organism: Secondary | ICD-10-CM

## 2011-08-13 DIAGNOSIS — R0602 Shortness of breath: Secondary | ICD-10-CM

## 2011-08-13 DIAGNOSIS — J4489 Other specified chronic obstructive pulmonary disease: Secondary | ICD-10-CM

## 2011-08-13 DIAGNOSIS — J449 Chronic obstructive pulmonary disease, unspecified: Secondary | ICD-10-CM

## 2011-08-13 DIAGNOSIS — R0902 Hypoxemia: Secondary | ICD-10-CM

## 2011-08-13 MED ORDER — LEVOFLOXACIN 500 MG PO TABS: 500.0000 mg | ORAL_TABLET | Freq: Every day | ORAL | Status: AC

## 2011-08-13 NOTE — Assessment & Plan Note (Signed)
No clear exacerbation with wheeze.. Will hold off on prednisone course for now. If wheezing begins, pt to call. Continue current COPD med regimen. Albuterol prn.

## 2011-08-13 NOTE — Patient Instructions (Signed)
Wear oxygen continuously. Start levofloxacin daily immediately.Misty Ferguson to pharmacy. Follow up on Friday, add if needed. Go to ER if increase in shortness of breath, wheezing, or oxygen not staying above 85 % on 2 Liters of oxygen. Call if not turning corner in next 24-48 hours.

## 2011-08-13 NOTE — Progress Notes (Signed)
  Subjective:    Patient ID: Misty Ferguson, female    DOB: [birth year], 77 y.o.   MRN: [id number]  HPI  77 year old female with history of severe COPD on intermittant oxygen.  On advair, spiriva, using   Noted 6 weeks ago felt hot and clammy lasted until going to sleep. 1 week ago oxygen started fluctuating high and low. Could not walk without shortness of breath.   No fever, no cough. No sputum change. No myalgia, no sore thraot , no swollen glands.   Few days of back ache, resolved.  She has been wearing oxygen continuously... o2 dropped to 83 off oxygen on way into office today.  Able to talk in complete sentences today.    Review of Systems  Constitutional: Positive for fatigue. Negative for fever.  HENT: Negative for ear pain.   Eyes: Negative for pain.  Respiratory: Positive for shortness of breath.   Cardiovascular: Negative for chest pain, palpitations and leg swelling.       Objective:   Physical Exam  Constitutional: She appears well-developed and well-nourished. No distress.       On oxygen 2 L, placed by nurse when O2 sat low  HENT:  Head: Normocephalic and atraumatic.  Right Ear: External ear normal.  Left Ear: External ear normal.  Nose: Nose normal.  Mouth/Throat: Oropharynx is clear and moist. No oropharyngeal exudate.  Eyes: Conjunctivae and EOM are normal. Pupils are equal, round, and reactive to light.  Neck: Normal range of motion. Neck supple. No thyromegaly present.  Cardiovascular: Normal rate, regular rhythm, normal heart sounds and intact distal pulses.  Exam reveals no gallop and no friction rub.   No murmur heard. Pulmonary/Chest: She has decreased breath sounds in the left middle field and the left lower field. She has no wheezes. She has no rhonchi. She has no rales.       Decreased breath sounds throughout lungs but more so in left base  Neurological: She is alert. No sensory deficit. She exhibits normal muscle tone.  Skin: She is not  diaphoretic.          Assessment & Plan:

## 2011-08-13 NOTE — Assessment & Plan Note (Signed)
Likely cause of decreased breath sounds in left , shortness of breath and hypoxia. Start levaquin for 10 days ASAP.  Continuous oxygen.  Discussed possible need for hospitalization given pt age and hypoxia.. Pt declined but will go to ER if not improving or increase in SOB, hypoxia. Close follow up in 3 days.

## 2011-08-14 LAB — HM DIABETES EYE EXAM

## 2011-08-17 ENCOUNTER — Ambulatory Visit (INDEPENDENT_AMBULATORY_CARE_PROVIDER_SITE_OTHER): Payer: BC Managed Care – PPO | Admitting: Family Medicine

## 2011-08-17 DIAGNOSIS — J449 Chronic obstructive pulmonary disease, unspecified: Secondary | ICD-10-CM

## 2011-08-17 DIAGNOSIS — R0902 Hypoxemia: Secondary | ICD-10-CM

## 2011-08-17 DIAGNOSIS — J189 Pneumonia, unspecified organism: Secondary | ICD-10-CM

## 2011-08-17 NOTE — Assessment & Plan Note (Signed)
Improved on day 4/10 of levaquin. Complete.

## 2011-08-17 NOTE — Patient Instructions (Signed)
Continue antibiotics, complete. Use oxygen as needed. Follow up if not continuing to improve. Call if portable oxygen tank needed.

## 2011-08-17 NOTE — Progress Notes (Signed)
  Subjective:    Patient ID: Misty Ferguson, female    DOB: 02-08-1934, 78 y.o.   MRN: [id number]  HPI  CAP, hypoxia in setting of severe COPD: Started on levaquin 4 days ago. No SE.  Today off Oxygen in office.. At 91% on RA.  No cough, no fever. She has not been doing to much recently.. So not too much SOB.      Review of Systems  Constitutional: Negative for fever and fatigue.  HENT: Negative for ear pain.   Eyes: Negative for pain.  Respiratory: Positive for shortness of breath. Negative for chest tightness.   Cardiovascular: Negative for chest pain, palpitations and leg swelling.  Gastrointestinal: Negative for abdominal pain.  Genitourinary: Negative for dysuria.       Objective:   Physical Exam  Constitutional: She appears well-developed and well-nourished.  HENT:  Head: Normocephalic and atraumatic.  Right Ear: External ear normal.  Left Ear: External ear normal.  Mouth/Throat: Oropharynx is clear and moist.  Eyes: Conjunctivae and EOM are normal. Pupils are equal, round, and reactive to light. Right eye exhibits no discharge. Left eye exhibits no discharge.  Neck: Normal range of motion. Neck supple.  Cardiovascular: Normal rate, regular rhythm, normal heart sounds and intact distal pulses.  Exam reveals no gallop and no friction rub.   No murmur heard. Pulmonary/Chest: No respiratory distress. She has decreased breath sounds. She has no wheezes. She has no rhonchi. She has no rales.       Decreased breaths sounds B but improved form last OV          Assessment & Plan:

## 2011-08-17 NOTE — Assessment & Plan Note (Signed)
Improved, use oxygen as needed.

## 2011-09-10 ENCOUNTER — Other Ambulatory Visit: Payer: Self-pay | Admitting: Family Medicine

## 2011-09-24 ENCOUNTER — Encounter: Payer: Self-pay | Admitting: Family Medicine

## 2011-09-24 ENCOUNTER — Ambulatory Visit (INDEPENDENT_AMBULATORY_CARE_PROVIDER_SITE_OTHER): Payer: BC Managed Care – PPO | Admitting: Family Medicine

## 2011-09-24 DIAGNOSIS — J441 Chronic obstructive pulmonary disease with (acute) exacerbation: Secondary | ICD-10-CM

## 2011-09-24 DIAGNOSIS — J209 Acute bronchitis, unspecified: Secondary | ICD-10-CM

## 2011-09-24 MED ORDER — PREDNISONE 20 MG PO TABS: ORAL_TABLET | ORAL | Status: DC

## 2011-09-24 MED ORDER — AZITHROMYCIN 250 MG PO TABS: ORAL_TABLET | ORAL | Status: AC

## 2011-09-24 NOTE — Progress Notes (Signed)
  Patient Name: Misty Ferguson Date of Birth: 1934/02/17 Age: 78 y.o. Medical Record Number: [id number] Gender: female Date of Encounter: 09/24/2011  History of Present Illness:  Misty Ferguson is a 78 y.o. very pleasant female patient who presents with the following:  Has had COPD for 15 + years, started on Saturday. Usual first symptoms are with breathing. Able to walk, and when getting sick. At home she will run in the 90's. In Jan was on ABX.  Severe COPD, history of hospitalization last year. She does have baseline use Advair as well as Spiriva. She also has iced at home, but only takes this when she is hypoxic. Currently she is 89% on room air. She has been having some increased shortness of breath, some coughing productive of sputum as well as some runny nose over the last few days.  Past Medical History, Surgical History, Social History, Family History, Problem List, Medications, and Allergies have been reviewed and updated if relevant.  Review of Systems: ROS: GEN: Acute illness details above GI: Tolerating PO intake GU: maintaining adequate hydration and urination Pulm: No SOB Interactive and getting along well at home.  Otherwise, ROS is as per the HPI.   Physical Examination: Filed Vitals:   09/24/11 1231  BP: 120/80  Pulse: 84  Temp: 98.7 F (37.1 C)  TempSrc: Oral  Weight: 111 lb 8 oz (50.576 kg)  SpO2: 89%    There is no height on file to calculate BMI.   GEN: A and O x 3. WDWN. NAD.    ENT: Nose clear, ext NML.  No LAD.  No JVD.  TM's clear. Oropharynx clear.  PULM: Normal WOB, no distress. Decreased lung sounds overall. No crackles, wheezes, rhonchi. CV: RRR, no M/G/R, No rubs, No JVD.   EXT: warm and well-perfused, No c/c/e. PSYCH: Pleasant and conversant.   Assessment and Plan: 1. COPD exacerbation  predniSONE (DELTASONE) 20 MG tablet, azithromycin (ZITHROMAX Z-PAK) 250 MG tablet  2. Acute bronchitis  predniSONE (DELTASONE) 20 MG tablet, azithromycin  (ZITHROMAX Z-PAK) 250 MG tablet    Acute COPD flare. Wear oxygen. Prednisone. Z-Pak.

## 2011-10-03 ENCOUNTER — Encounter: Payer: Self-pay | Admitting: Family Medicine

## 2011-10-03 ENCOUNTER — Ambulatory Visit (INDEPENDENT_AMBULATORY_CARE_PROVIDER_SITE_OTHER)
Admission: RE | Admit: 2011-10-03 | Discharge: 2011-10-03 | Disposition: A | Discharge status: Home / Self Care | Payer: BC Managed Care – PPO | Source: Ambulatory Visit | Attending: Family Medicine | Admitting: Family Medicine

## 2011-10-03 ENCOUNTER — Ambulatory Visit (INDEPENDENT_AMBULATORY_CARE_PROVIDER_SITE_OTHER): Payer: BC Managed Care – PPO | Admitting: Family Medicine

## 2011-10-03 VITALS — BP 120/78 | HR 95 | Temp 97.6°F | Ht 61.0 in | Wt 111.4 lb

## 2011-10-03 DIAGNOSIS — B999 Unspecified infectious disease: Secondary | ICD-10-CM

## 2011-10-03 DIAGNOSIS — R05 Cough: Secondary | ICD-10-CM

## 2011-10-03 DIAGNOSIS — J441 Chronic obstructive pulmonary disease with (acute) exacerbation: Secondary | ICD-10-CM

## 2011-10-03 DIAGNOSIS — R059 Cough, unspecified: Secondary | ICD-10-CM

## 2011-10-03 DIAGNOSIS — J189 Pneumonia, unspecified organism: Secondary | ICD-10-CM

## 2011-10-03 MED ORDER — LEVOFLOXACIN 500 MG PO TABS: 500.0000 mg | ORAL_TABLET | Freq: Every day | ORAL | Status: AC

## 2011-10-03 NOTE — Progress Notes (Signed)
  Patient Name: Misty Ferguson Date of Birth: Jul 01, 1934 Medical Record Number: [id number] Gender: female Date of Encounter: 10/03/2011  History of Present Illness:  Misty Ferguson is a 78 y.o. very pleasant female patient who presents with the following:  Pain in the left side of her chest and phlegm and nasty cough. Feels pretty lousy - feels like she might have PNA. H/o pna a few month ago. Req LVQ.  Pulse oximetry on room air is 91% Using 02 at home  rx with zpak and prednisone last week No improvement   Patient Active Problem List  Diagnoses  . HERPES ZOSTER  . DM, UNCOMPLICATED, TYPE II  . HYPERCHOLESTEROLEMIA  . MACULAR DEGENERATION  . GLAUCOMA  . HYPERTENSION  . COPD  . OSTEOPOROSIS  . MICROALBUMINURIA  . CAP (community acquired pneumonia)  . Hypoxia   Past Medical History  Diagnosis Date  . Unspecified disorder resulting from impaired renal function   . Type II or unspecified type diabetes mellitus without mention of complication, not stated as uncontrolled   . Obstructive chronic bronchitis with exacerbation   . Tobacco use disorder   . Unspecified glaucoma   . Macular degeneration (senile) of retina, unspecified   . Personal history of peptic ulcer disease   . Unspecified essential hypertension   . Chronic airway obstruction, not elsewhere classified   . Osteoporosis, unspecified    No past surgical history on file. History  Substance Use Topics  . Smoking status: Never Smoker   . Smokeless tobacco: Not on file  . Alcohol Use: Yes   No family history on file. No Known Allergies  Medication list has been reviewed and updated.  Review of Systems: ROS: GEN: Acute illness details above GI: Tolerating PO intake GU: maintaining adequate hydration and urination Pulm: SOB with minimal exertion Interactive and getting along well at home.  Otherwise, ROS is as per the HPI.   Physical Examination: Filed Vitals:   10/03/11 0934  BP: 120/78  Pulse: 95    Temp: 97.6 F (36.4 C)  TempSrc: Oral  Height: 5\' 1"  (1.549 m)  Weight: 111 lb 6.4 oz (50.531 kg)  SpO2: 91%    Body mass index is 21.05 kg/(m^2).   GEN: A and O x 3. WDWN. NAD.    ENT: Nose clear, ext NML.  No LAD.  No JVD.  TM's clear. Oropharynx clear.  PULM: Normal WOB, no distress. Rare L lower lung crackles, decreased BS diffusely CV: RRR, no M/G/R, No rubs, No JVD.   EXT: warm and well-perfused, No c/c/e. PSYCH: Pleasant and conversant.   Assessment and Plan: 1. Cough  DG Chest 2 View, levofloxacin (LEVAQUIN) 500 MG tablet  2. Pneumonia due to infectious agent  levofloxacin (LEVAQUIN) 500 MG tablet  3. COPD exacerbation      LVQ, albuterol  Chest XR: appears to have small ? Infiltrate L upper lobe

## 2011-11-12 ENCOUNTER — Other Ambulatory Visit: Payer: Self-pay | Admitting: *Deleted

## 2011-11-12 MED ORDER — FLUTICASONE-SALMETEROL 250-50 MCG/DOSE IN AEPB: 1.0000 | INHALATION_SPRAY | Freq: Two times a day (BID) | RESPIRATORY_TRACT | Status: DC

## 2012-01-10 ENCOUNTER — Encounter: Payer: Self-pay | Admitting: Family Medicine

## 2012-01-10 ENCOUNTER — Ambulatory Visit (INDEPENDENT_AMBULATORY_CARE_PROVIDER_SITE_OTHER): Payer: BC Managed Care – PPO | Admitting: Family Medicine

## 2012-01-10 VITALS — BP 142/84 | HR 96 | Temp 97.8°F | Wt 111.2 lb

## 2012-01-10 DIAGNOSIS — S7010XA Contusion of unspecified thigh, initial encounter: Secondary | ICD-10-CM | POA: Insufficient documentation

## 2012-01-10 NOTE — Patient Instructions (Signed)
You have a quadriceps hematoma. Continue icing leg and keep elevated as much as able. We will wrap your leg with ace bandage today. Continue ibuprofen 2-3 times daily for next few days. Do exercises provided today as able, follow up with Dr. Patsy Lager for recheck in 1-2 weeks.  Quadriceps Contusion  Quadriceps contusion is a bruise of the large muscle in the front of your thigh. It is necessary to follow your caregiver's directions when this muscle is bruised.  CAUSES It comes from an injury, such as getting hit while playing football. DIAGNOSIS  Often the diagnosis can be made by examination. HOME CARE INSTRUCTIONS   While awake, apply ice to the sore area for 15 to 20 minutes 3 to 4 times per day for the first 2 days. Put the ice in a plastic bag. Place a towel between the bag of ice and your skin.   Keep your knee elevated. Lie down with a pillow under your knee when lying flat on your back.   If a compression bandage such as an ace wrap was applied, use it until you are seen for a follow-up examination. You may remove it for sleeping, showers, and baths. If the wrap seems to be too tight and is uncomfortable or your toes or foot is getting cold or blue, remove the wrap and reapply it more loosely. If this is done make sure your thigh is not getting larger in size.   For activity, walk or move around as the pain allows, or as instructed. Resume full activities as suggested by your caregiver. This is often safest when the strength of the injured leg is up to about 90% of your good leg.   Only take over-the-counter or prescription medicines for pain, discomfort, or fever as directed by your caregiver. Do not take aspirin for the first few days as the bleeding into the thigh muscle may be increased. Only use these if your caregiver has not given medications that interfere.  SEEK MEDICAL CARE IF:   You have an increase in bruising, swelling or pain.   You notice coldness or blueness of your  toes or foot.   You do not get pain relief with medications.   You have increasing pain in the area and seem to be getting worse rather than better.   You notice your thigh getting larger in size.  Document Released: 04/24/2001 Document Revised: 07/19/2011 Document Reviewed: 05/15/2011 [facility] Patient Information 2012 [place], [place].

## 2012-01-10 NOTE — Assessment & Plan Note (Signed)
Left sided.  Doubt significant muscle/tendon tear. Recommended elevation of leg, ice, and OTC NSAID (pt states tolerates well). Provided with stretching exercises from [facility] pt advisor. Wrapped distal thigh on left with ace wrap. If not improving as expected, asked her to f/u with Dr. Patsy Lager for further evaluation.

## 2012-01-10 NOTE — Progress Notes (Signed)
  Subjective:    Patient ID: Misty Ferguson, female    DOB: [birth year], 78 y.o.   MRN: [id number]  HPI CC: ?hematoma  H/o DM, COPD.  Takes aspirin daily.  On Sunday hit left lateral thigh distally with desk coming out of church.  No trouble with flexing/extending knee.  Has been using ice and 400mg  ibuprofen bid which has only mildly helped.  Very tender.  Worse pain with prolonged sitting.    Several years ago with similar injury, took long time to heal.  Trouble walking 2/2 pain more than 200 feet, notes COPD also causing difficulty with ambulation.  Requests temporary handicap placard, filled out  Review of Systems Per HPI    Objective:   Physical Exam  Nursing note and vitals reviewed. Constitutional: She appears well-developed and well-nourished. No distress.  Musculoskeletal:       Left knee unable to fully extend at knee. Swelling and mild erythema distal lateral thigh, tender to palpation Knee joint nontender to palpation, no crepitus. No knee effusion or swelling present. Right knee WNL.  Skin: Skin is warm and dry.       Easy bruising, thin skin       Assessment & Plan:

## 2012-01-23 ENCOUNTER — Ambulatory Visit (INDEPENDENT_AMBULATORY_CARE_PROVIDER_SITE_OTHER): Payer: Medicare Other | Admitting: Pulmonary Disease

## 2012-01-23 ENCOUNTER — Encounter: Payer: Self-pay | Admitting: Pulmonary Disease

## 2012-01-23 VITALS — BP 110/66 | HR 82 | Temp 97.8°F | Ht 61.5 in | Wt 110.1 lb

## 2012-01-23 DIAGNOSIS — J449 Chronic obstructive pulmonary disease, unspecified: Secondary | ICD-10-CM

## 2012-01-23 DIAGNOSIS — I10 Essential (primary) hypertension: Secondary | ICD-10-CM

## 2012-01-23 MED ORDER — AZITHROMYCIN 250 MG PO TABS: 250.0000 mg | ORAL_TABLET | Freq: Every day | ORAL | Status: DC

## 2012-01-23 NOTE — Assessment & Plan Note (Signed)
COPD: GOLD Grade B Combined recommendations from the [employer]	[place], [employer], Designer, television/film set, European Respiratory Society (Qaseem A et al, Ann Intern Med. 2011;155(3):179) recommends tobacco cessation, pulmonary rehab (for symptomatic patients with an FEV1 < 50% predicted), supplemental oxygen (for patients with SaO2 <88% or paO2 <55), and appropriate bronchodilator therapy.  In regards to long acting bronchodilators, they recommend monotherapy (FEV1 60-80% with symptoms weak evidence, FEV1 with symptoms <60% strong evidence), or combination therapy (FEV1 <60% with symptoms, strong recommendation, moderate evidence).  One should also provide patients with annual immunizations and consider therapy for prevention of COPD exacerbations (ie. roflumilast or azithromycin) when appopriate.  -O2 therapy: 2 L with exertion, order overnight oximetry test -Immunizations: Up to date -Tobacco use: Quit many years ago -Exercise: Exercises daily (40 minutes daily) I encouraged that she start an upper body weight routine in addition to her cardio routine; no real reason to send her to pulmonary rehab -Bronchodilator therapy: continue spiriva and advair -Exacerbation prevention: Annual exacerbations, discussed starting roflumilast today; she doesn't want this because of cost; I explained to her that an alternative option is azithromycin daily which will require EKG's to monitor QTc prolongation and CMET's twice a year

## 2012-01-23 NOTE — Patient Instructions (Signed)
We will start you on azithromycin 250mg  po daliy We will have you come back one month from now for an EKG and blood work Take your spiriva and advair as you are doing Call [facility] to see if you could get Perforomist and Pulmicort through them for less money than your Advair We will set up an overnight oximetry test for you We will order portable oxygen for you from [facility] We will see you back in three months

## 2012-01-23 NOTE — Progress Notes (Signed)
Subjective:    Patient ID: Misty Ferguson, female    DOB: Jun 25, 1934, 78 y.o.   MRN: [id number]  HPI  Misty Ferguson is a pleasant female with COPD due to a 45 pack year smoking history who comes to our clinic to establish pulmonary care for the same.  She developed COPD 15 years ago and was diagnosed by hier PCP.  She has been evaluated by Dr. Mayo Ao for the same 5 years ago.  Her most significant symptom over the years has been shortness of breath on exertion which has been gradually increasing over the years.  Despite this she is able to walk 2 miles daily without difficulty.  She states taht she gets short of breath with fast walking or heavy exertion.  She typically has bronchitis or a flare of COPD annually, and has developed pneumonia several times.  Her last round of steroids was in January 2012.  She has been hospitalized for pneumonia.  She does not have a chronic cough.  She uses oxygen when exerting herself.  She is compliant with her inhalers.     Past Medical History  Diagnosis Date  . Unspecified disorder resulting from impaired renal function   . Type II or unspecified type diabetes mellitus without mention of complication, not stated as uncontrolled   . Obstructive chronic bronchitis with exacerbation   . Tobacco use disorder   . Unspecified glaucoma   . Macular degeneration (senile) of retina, unspecified   . Personal history of peptic ulcer disease   . Unspecified essential hypertension   . Chronic airway obstruction, not elsewhere classified   . Osteoporosis, unspecified      No family history on file.   History   Social History  . Marital Status: Married    Spouse Name: N/A    Number of Children: 3  . Years of Education: N/A   Occupational History  . RETIRED    Social History Main Topics  . Smoking status: Former Smoker -- 1.3 packs/day for 45 years    Types: Cigarettes    Quit date: 08/14/2003  . Smokeless tobacco: Never Used  . Alcohol Use: Yes   Occasional  . Drug Use: No  . Sexually Active: Not on file   Other Topics Concern  . Not on file   Social History Narrative   Tobacco abuse x 45 yearsHas living will, HCPOA: Bradley Ferris.     No Known Allergies   Outpatient Prescriptions Prior to Visit  Medication Sig Dispense Refill  . albuterol (ACCUNEB) 1.25 MG/3ML nebulizer solution Take 3 mLs (1.25 mg total) by nebulization every 6 (six) hours as needed.  75 mL  11  . aspirin 81 MG tablet Take 81 mg by mouth daily.       . Calcium Carbonate-Vitamin D (CALCIUM 600/VITAMIN D) 600-400 MG-UNIT per tablet Take 1 tablet by mouth 2 (two) times daily.        . Fluticasone-Salmeterol (ADVAIR DISKUS) 250-50 MCG/DOSE AEPB Inhale 1 puff into the lungs 2 (two) times daily.  60 each  2  . losartan (COZAAR) 50 MG tablet TAKE 1 TABLET BY MOUTH ONCE A DAY  30 tablet  10  . Multiple Vitamins-Minerals (OCUVITE ADULT 50+ PO) Take 1 tablet by mouth daily.        . Omega-3 Fatty Acids (GNP FISH OIL CONCENTRATE) 1000 MG CAPS Take 1 capsule by mouth daily.        Marland Kitchen SPIRIVA HANDIHALER 18 MCG inhalation capsule INHALE THE CONTENTS OF 1  CAPSULE EVERY DAY  90 each  2      Review of Systems  Constitutional: Negative for fever, chills and unexpected weight change.  HENT: Negative for ear pain, nosebleeds, congestion, sore throat, rhinorrhea, sneezing, trouble swallowing, dental problem, voice change, postnasal drip and sinus pressure.   Eyes: Negative for visual disturbance.  Respiratory: Positive for shortness of breath. Negative for cough and choking.   Cardiovascular: Negative for chest pain and leg swelling.  Gastrointestinal: Negative for vomiting, abdominal pain and diarrhea.  Genitourinary: Negative for difficulty urinating.  Musculoskeletal: Positive for arthralgias.  Skin: Negative for rash.  Neurological: Negative for tremors, syncope and headaches.  Hematological: Bruises/bleeds easily.       Objective:   Physical Exam  Filed Vitals:    01/23/12 1613  BP: 110/66  Pulse: 82  Temp: 97.8 F (36.6 C)  TempSrc: Oral  Height: 5' 1.5" (1.562 m)  Weight: 49.95 kg (110 lb 1.9 oz)  SpO2: 95%   Gen: well appearing, no acute distress HEENT: NCAT, PERRL, EOMi, OP clear, neck supple without masses PULM: Poor air movement bilaterally, no wheeze CV: RRR, slight systolic murmur, no JVD AB: BS+, soft, nontender, no hsm Ext: warm, no edema, no clubbing, no cyanosis Derm: no rash or skin breakdown Neuro: A&Ox4, CN II-XII intact, strength 5/5 in all 4 extremities  01/2012 Spirometry: Ratio 36% FEV1 0.85L (46% pred)  01/2012 CXR (My read) hyperinflated lungs, patchy airspace disease in the LUL     Assessment & Plan:   COPD COPD: GOLD Grade B Combined recommendations from the [employer], [employer] of [employer], Designer, television/film set, European Respiratory Society (Qaseem A et al, Ann Intern Med. 2011;155(3):179) recommends tobacco cessation, pulmonary rehab (for symptomatic patients with an FEV1 < 50% predicted), supplemental oxygen (for patients with SaO2 <88% or paO2 <55), and appropriate bronchodilator therapy.  In regards to long acting bronchodilators, they recommend monotherapy (FEV1 60-80% with symptoms weak evidence, FEV1 with symptoms <60% strong evidence), or combination therapy (FEV1 <60% with symptoms, strong recommendation, moderate evidence).  One should also provide patients with annual immunizations and consider therapy for prevention of COPD exacerbations (ie. roflumilast or azithromycin) when appopriate.  -O2 therapy: 2 L with exertion, order overnight oximetry test -Immunizations: Up to date -Tobacco use: Quit many years ago -Exercise: Exercises daily (40 minutes daily) I encouraged that she start an upper body weight routine in addition to her cardio routine; no real reason to send her to pulmonary rehab -Bronchodilator therapy: continue spiriva and advair -Exacerbation  prevention: Annual exacerbations, discussed starting roflumilast today; she doesn't want this because of cost; I explained to her that an alternative option is azithromycin daily which will require EKG's to monitor QTc prolongation and CMET's twice a year    Updated Medication List Outpatient Encounter Prescriptions as of 01/23/2012  Medication Sig Dispense Refill  . albuterol (ACCUNEB) 1.25 MG/3ML nebulizer solution Take 3 mLs (1.25 mg total) by nebulization every 6 (six) hours as needed.  75 mL  11  . aspirin 81 MG tablet Take 81 mg by mouth daily.       . Calcium Carbonate-Vitamin D (CALCIUM 600/VITAMIN D) 600-400 MG-UNIT per tablet Take 1 tablet by mouth 2 (two) times daily.        . Fluticasone-Salmeterol (ADVAIR DISKUS) 250-50 MCG/DOSE AEPB Inhale 1 puff into the lungs 2 (two) times daily.  60 each  2  . losartan (COZAAR) 50 MG tablet TAKE 1 TABLET BY MOUTH ONCE A DAY  30 tablet  10  . Multiple Vitamins-Minerals (OCUVITE ADULT 50+ PO) Take 1 tablet by mouth daily.        . Omega-3 Fatty Acids (GNP FISH OIL CONCENTRATE) 1000 MG CAPS Take 1 capsule by mouth daily.        Marland Kitchen SPIRIVA HANDIHALER 18 MCG inhalation capsule INHALE THE CONTENTS OF 1 CAPSULE EVERY DAY  90 each  2  . azithromycin (ZITHROMAX) 250 MG tablet Take 1 tablet (250 mg total) by mouth daily.  30 each  1

## 2012-01-24 ENCOUNTER — Telehealth: Payer: Self-pay | Admitting: Pulmonary Disease

## 2012-01-24 ENCOUNTER — Telehealth: Payer: Self-pay | Admitting: *Deleted

## 2012-01-24 DIAGNOSIS — J189 Pneumonia, unspecified organism: Secondary | ICD-10-CM

## 2012-01-24 NOTE — Telephone Encounter (Signed)
LMTCB- she can come in on 02-20-12 at [facility] clinic for this. 1:30 pm time slot preffered. I also need to inform her that she needs cxr done. See other phone note for details. Will await call back.

## 2012-01-24 NOTE — Telephone Encounter (Signed)
Message copied by Christen Butter on Thu Jan 24, 2012  1:05 PM ------      Message from: Veto Kemps B      Created: Thu Jan 24, 2012  9:13 AM       L,            I forgot to order a CXR for Ms. Oguin.  She had an abnormal CXR from pneumonia in 09/2011 and she needs a repeat to make sure the abnormalities cleared up with antibiotics.            Can you order it and let her know?            Thanks,      B

## 2012-01-24 NOTE — Telephone Encounter (Signed)
LMTCB for pt to see if she can come to [facility] office to have cxr.

## 2012-01-24 NOTE — Telephone Encounter (Signed)
Spoke with pt and explained the need for f/u cxr to be sure that PNA is gone. She is okay with having this done, so scheduled her to have cxr done at [facility] office at 9 am for Monday 01/28/12.

## 2012-01-24 NOTE — Telephone Encounter (Signed)
Spoke with Misty Ferguson-explained again to her as I did at ov yesterday that her visit for labs and ekg will not include her having to see MD unless of course her ekg is abnormal. She verbalized understanding and the appt was scheduled for 02-20-12 at 1:30.

## 2012-01-24 NOTE — Telephone Encounter (Signed)
1:30 time slot on 02/20/12 is  filled all ready-did not know if pt needed to appear on BQ's schedule or not.  Pt stated she thought she would only be seeing Verlon Au, not the doc.  Also, pt is confused about CXR.  Pt stated that she has not had a CXR done recently for BQ.  Pt stated we can check her records from [facility] if CXR is necessary.  Pt stated she will be leaving again shortly.  Antionette Fairy

## 2012-01-28 ENCOUNTER — Ambulatory Visit (INDEPENDENT_AMBULATORY_CARE_PROVIDER_SITE_OTHER)
Admission: RE | Admit: 2012-01-28 | Discharge: 2012-01-28 | Disposition: A | Discharge status: Home / Self Care | Payer: Medicare Other | Source: Ambulatory Visit | Attending: Pulmonary Disease | Admitting: Pulmonary Disease

## 2012-01-28 DIAGNOSIS — J189 Pneumonia, unspecified organism: Secondary | ICD-10-CM

## 2012-01-29 NOTE — Progress Notes (Signed)
I called her this evening to discuss the issue.  Questions addressed.

## 2012-02-15 ENCOUNTER — Other Ambulatory Visit: Payer: Self-pay | Admitting: Family Medicine

## 2012-02-20 ENCOUNTER — Encounter: Payer: Self-pay | Admitting: Pulmonary Disease

## 2012-02-20 ENCOUNTER — Ambulatory Visit (INDEPENDENT_AMBULATORY_CARE_PROVIDER_SITE_OTHER): Payer: Medicare Other | Admitting: Pulmonary Disease

## 2012-02-20 VITALS — BP 130/72 | HR 93 | Temp 97.8°F | Ht 61.0 in | Wt 111.0 lb

## 2012-02-20 DIAGNOSIS — Z5181 Encounter for therapeutic drug level monitoring: Secondary | ICD-10-CM

## 2012-02-20 NOTE — Progress Notes (Deleted)
  Subjective:    Patient ID: Misty Ferguson, female    DOB: 1934-01-12, 78 y.o.   MRN: [id number]  Synopsis: Misty Ferguson first saw the [facility] in the spring of 2013 for GOLD Grade B COPD after 45 years of smoking.  She exercises 40 minutes daily.  She typically has flares of COPD once a year.  HPI    Past Medical History  Diagnosis Date  . Unspecified disorder resulting from impaired renal function   . Type II or unspecified type diabetes mellitus without mention of complication, not stated as uncontrolled   . Obstructive chronic bronchitis with exacerbation   . Tobacco use disorder   . Unspecified glaucoma   . Macular degeneration (senile) of retina, unspecified   . Personal history of peptic ulcer disease   . Unspecified essential hypertension   . Chronic airway obstruction, not elsewhere classified   . Osteoporosis, unspecified       Review of Systems  Constitutional: Negative for fever, chills and unexpected weight change.  HENT: Negative for congestion, rhinorrhea, sneezing, postnasal drip and sinus pressure.   Respiratory: Positive for shortness of breath. Negative for cough and choking.   Cardiovascular: Negative for chest pain and leg swelling.       Objective:   Physical Exam   There were no vitals filed for this visit. Gen: well appearing, no acute distress HEENT: NCAT, PERRL, EOMi, OP clear, neck supple without masses PULM: Poor air movement bilaterally, no wheeze CV: RRR, slight systolic murmur, no JVD AB: BS+, soft, nontender, no hsm Ext: warm, no edema, no clubbing, no cyanosis  01/2012 Spirometry: Ratio 36% FEV1 0.85L (46% pred)  01/2012 CXR (My read) hyperinflated lungs, patchy airspace disease in the LUL     Assessment & Plan:   No problem-specific assessment & plan notes found for this encounter.   Updated Medication List Outpatient Encounter Prescriptions as of 02/20/2012  Medication Sig Dispense Refill  . albuterol (ACCUNEB) 1.25  MG/3ML nebulizer solution Take 3 mLs (1.25 mg total) by nebulization every 6 (six) hours as needed.  75 mL  11  . aspirin 81 MG tablet Take 81 mg by mouth daily.       Marland Kitchen azithromycin (ZITHROMAX) 250 MG tablet Take 1 tablet (250 mg total) by mouth daily.  30 each  1  . Calcium Carbonate-Vitamin D (CALCIUM 600/VITAMIN D) 600-400 MG-UNIT per tablet Take 1 tablet by mouth 2 (two) times daily.        . Fluticasone-Salmeterol (ADVAIR DISKUS) 250-50 MCG/DOSE AEPB Inhale 1 puff into the lungs 2 (two) times daily.  60 each  2  . losartan (COZAAR) 50 MG tablet TAKE 1 TABLET BY MOUTH ONCE A DAY  30 tablet  10  . Multiple Vitamins-Minerals (OCUVITE ADULT 50+ PO) Take 1 tablet by mouth daily.        . Omega-3 Fatty Acids (GNP FISH OIL CONCENTRATE) 1000 MG CAPS Take 1 capsule by mouth daily.        Marland Kitchen SPIRIVA HANDIHALER 18 MCG inhalation capsule INHALE CONTENTS OF ONE CAPSULE EVERY DAY  90 each  3

## 2012-02-21 LAB — COMPREHENSIVE METABOLIC PANEL
ALT: 22 U/L (ref 0–35)
Albumin: 4.5 g/dL (ref 3.5–5.2)
CO2: 23 mEq/L (ref 19–32)
Calcium: 9.7 mg/dL (ref 8.4–10.5)
Chloride: 105 mEq/L (ref 96–112)
Glucose, Bld: 80 mg/dL (ref 70–99)
Potassium: 5.3 mEq/L (ref 3.5–5.3)
Sodium: 141 mEq/L (ref 135–145)
Total Bilirubin: 0.5 mg/dL (ref 0.3–1.2)
Total Protein: 6.4 g/dL (ref 6.0–8.3)

## 2012-02-21 NOTE — Progress Notes (Signed)
Quick Note:  Spoke with pt's spouse and notified of results per BQ and he verbalized understanding. States will inform the pt. ______

## 2012-02-21 NOTE — Progress Notes (Signed)
Nurse only visit for drug monitoring

## 2012-03-02 ENCOUNTER — Other Ambulatory Visit: Payer: Self-pay | Admitting: Family Medicine

## 2012-03-13 ENCOUNTER — Other Ambulatory Visit: Payer: Self-pay | Admitting: Pulmonary Disease

## 2012-03-13 ENCOUNTER — Other Ambulatory Visit: Payer: Self-pay | Admitting: Family Medicine

## 2012-03-13 MED ORDER — AZITHROMYCIN 250 MG PO TABS: 250.0000 mg | ORAL_TABLET | Freq: Every day | ORAL | Status: DC

## 2012-03-19 ENCOUNTER — Telehealth: Payer: Self-pay | Admitting: Pulmonary Disease

## 2012-03-19 NOTE — Telephone Encounter (Signed)
Will route msg to Les to follow up on when form received.

## 2012-03-19 NOTE — Telephone Encounter (Signed)
Called, spoke with pt who states she would like to get portable o2 through [employer].  States she called them today and was advised they would send over info for Dr. Kendrick Fries.  She would like him to be on the look out of this.  Will forward msg to him so he is aware.

## 2012-03-19 NOTE — Telephone Encounter (Signed)
noted 

## 2012-03-28 ENCOUNTER — Telehealth: Payer: Self-pay | Admitting: Pulmonary Disease

## 2012-03-28 NOTE — Telephone Encounter (Signed)
Spoke with the pt and she states [facility] told her they need proof of qualifying sats for medicare.  I called [facility] to verify info needed and they need exertional sats. Flowsheet printed and faxed to Attn: Kristie at [phone]. Nothing further needed. Pt is aware. Carron Curie, CMA

## 2012-04-21 ENCOUNTER — Telehealth: Payer: Self-pay | Admitting: Pulmonary Disease

## 2012-04-21 MED ORDER — AZITHROMYCIN 250 MG PO TABS: 250.0000 mg | ORAL_TABLET | Freq: Every day | ORAL | Status: DC

## 2012-04-21 NOTE — Telephone Encounter (Signed)
I spoke with pt and is aware rx was sent. Nothing further was needed 

## 2012-05-12 ENCOUNTER — Ambulatory Visit (INDEPENDENT_AMBULATORY_CARE_PROVIDER_SITE_OTHER): Payer: Medicare Other

## 2012-05-12 DIAGNOSIS — Z23 Encounter for immunization: Secondary | ICD-10-CM

## 2012-06-05 ENCOUNTER — Ambulatory Visit (INDEPENDENT_AMBULATORY_CARE_PROVIDER_SITE_OTHER): Payer: Medicare Other | Admitting: Family Medicine

## 2012-06-05 ENCOUNTER — Encounter: Payer: Self-pay | Admitting: Family Medicine

## 2012-06-05 VITALS — BP 122/78 | HR 88 | Temp 98.1°F | Ht 61.0 in | Wt 113.0 lb

## 2012-06-05 DIAGNOSIS — Z0000 Encounter for general adult medical examination without abnormal findings: Secondary | ICD-10-CM

## 2012-06-05 DIAGNOSIS — E119 Type 2 diabetes mellitus without complications: Secondary | ICD-10-CM

## 2012-06-05 DIAGNOSIS — E7800 Pure hypercholesterolemia, unspecified: Secondary | ICD-10-CM

## 2012-06-05 DIAGNOSIS — I10 Essential (primary) hypertension: Secondary | ICD-10-CM

## 2012-06-05 DIAGNOSIS — Z1212 Encounter for screening for malignant neoplasm of rectum: Secondary | ICD-10-CM

## 2012-06-05 LAB — HM DIABETES FOOT EXAM

## 2012-06-05 LAB — HEMOGLOBIN A1C: Hgb A1c MFr Bld: 5.9 % (ref 4.6–6.5)

## 2012-06-05 NOTE — Progress Notes (Signed)
I have personally reviewed the Medicare Annual Wellness questionnaire and have noted 1. The patient's medical and social history 2. Their use of alcohol, tobacco or illicit drugs 3. Their current medications and supplements 4. The patient's functional ability including ADL's, fall risks, home safety risks and hearing or visual             impairment. 5. Diet and physical activities 6. Evidence for depression or mood disorders The patients weight, height, BMI and visual acuity have been recorded in the chart I have made referrals, counseling and provided education to the patient based review of the above and I have provided the pt with a written personalized care plan for preventive services.   Diabetes: Due for re-eval.   Lab Results  Component Value Date   HGBA1C 6.1 04/21/2010   Using medications without difficulties: On no meds.  Hypoglycemic episodes: ?  Hyperglycemic episodes:?  Feet problems:None  Blood Sugars averaging:not checking.  eye exam within last year: yes   Hypertension: At goal on losartan  Using medication without problems or lightheadedness: None  Chest pain with exertion:None  Edema:None  Short of breath: stable Average home BPs:not checking  Other issues:   Elevated Cholesterol: Due for check Lab Results  Component Value Date   CHOL 254* 06/14/2011   HDL 157.40 06/14/2011   LDLCALC  Value: 43        Total Cholesterol/HDL:CHD Risk Coronary Heart Disease Risk Table                     Men   Women  1/2 Average Risk   3.4   3.3  Average Risk       5.0   4.4  2 X Average Risk   9.6   7.1  3 X Average Risk  23.4   11.0        Use the calculated Patient Ratio above and the CHD Risk Table to determine the patient's CHD Risk.        ATP III CLASSIFICATION (LDL):  <100     mg/dL   Optimal  161-096  mg/dL   Near or Above                    Optimal  130-159  mg/dL   Borderline  045-409  mg/dL   High  >811     mg/dL   Very High 04/13/4781   LDLDIRECT 75.0 06/14/2011   TRIG  55.0 06/14/2011   CHOLHDL 2 06/14/2011   Using medications without problems: On no rx medication.  Diet compliance: Good  Exercise:Good, Walking 1-2 a day.   COPD, emphesema, severe: on advair, spiriva  Followed by Dr. Kendrick Fries ,last saw in 01/2012 Last hospitalization 08/2010... Since then doing well  Using oxygen as need 2-3 times a week, using at night. No need for rescue inhalers in past 6 months. Has access to albuterol in nebs available.   Left leg pain  (upper thigh)following hematoma 4 years ago. Hit same area on leg few months ago. Had been doing exercises, ice etc. She reports the area remains sensitive. No swelling, no redness.  Review of Systems  Constitutional: Negative for fever, fatigue and unexpected weight change.  HENT: Negative for ear pain, congestion, sore throat, sneezing, trouble swallowing and sinus pressure.  Eyes: Negative for pain and itching.  Respiratory: Positive for cough and shortness of breath.  Cardiovascular: Negative for chest pain, palpitations and leg swelling.  Gastrointestinal: Negative for nausea,  abdominal pain, diarrhea, constipation and blood in stool.  Genitourinary: Negative for dysuria, hematuria, vaginal discharge, difficulty urinating and menstrual problem.  Skin: Negative for rash.  Neurological: Negative for syncope, weakness, light-headedness, numbness and headaches.  Psychiatric/Behavioral: Negative for confusion and dysphoric mood. The patient is not nervous/anxious.    Objective:   Physical Exam  Constitutional: Vital signs are normal. She appears well-developed and well-nourished. She is cooperative. Non-toxic appearance. She does not appear ill. No distress.  HENT:  Head: Normocephalic.  Right Ear: Hearing, tympanic membrane, external ear and ear canal normal.  Left Ear: Hearing, tympanic membrane, external ear and ear canal normal.  Nose: Nose normal.  Eyes: Conjunctivae, EOM and lids are normal. Pupils are equal, round, and  reactive to light. No foreign bodies found.  Neck: Trachea normal and normal range of motion. Neck supple. Carotid bruit is not present. No mass and no thyromegaly present.  Cardiovascular: Normal rate, regular rhythm, S1 normal, S2 normal, normal heart sounds and intact distal pulses. Exam reveals no gallop.  No murmur heard.  Pulmonary/Chest: Effort normal. No respiratory distress. She has decreased breath sounds in the right upper field, the right middle field, the right lower field, the left upper field, the left middle field and the left lower field. She has no wheezes. She has no rhonchi. She has no rales.  Stable exam  Abdominal: Soft. Normal appearance and bowel sounds are normal. She exhibits no distension, no fluid wave, no abdominal bruit and no mass. There is no hepatosplenomegaly. There is no tenderness. There is no rebound, no guarding and no CVA tenderness. No hernia.  Genitourinary: Nml breast exam no masses, no axillary lymphadenopathy  PAP/ DVE not indicated Diffuse atrophy in left quad laterally, tenderness  To palpation. Lymphadenopathy:  She has no cervical adenopathy.  She has no axillary adenopathy.  Neurological: She is alert. She has normal strength. No cranial nerve deficit or sensory deficit.  Skin: Skin is warm, dry and intact. No rash noted.  Psychiatric: Her speech is normal and behavior is normal. Judgment normal. Her mood appears not anxious. Cognition and memory are normal. She does not exhibit a depressed mood.    Assessment & Plan:   Annual Medicare Wellness: The patient's preventative maintenance and recommended screening tests for an annual wellness exam were reviewed in full today.  Brought up to date unless services declined.  Counselled on the importance of diet, exercise, and its role in overall health and mortality.  The patient's FH and SH was reviewed, including their home life, tobacco status, and drug and alcohol status.   Vaccines:Has gotten flu,  Uptodate with Td, shingles,PNA  Mammogram: last in 2009, not interested in continuing to follow.  DEXA: on fosamax (many years about 10 years) last checked 2009... Stopped fosamax , then recheck DXA in 2014. Colon:  iFOB yearly. Discussed the risks and benefits of cancer screening... Pt voices understanding an chooses to not proceed with prevention as noted above.  No indication for pap/DVE.

## 2012-06-05 NOTE — Patient Instructions (Addendum)
Stop at lab on your way out.  Continue work on regular exercise as tolerated and healthy diet.

## 2012-06-05 NOTE — Progress Notes (Deleted)
  Subjective:    Patient ID: Misty Ferguson, female    DOB: 1934-03-06, 78 y.o.   MRN: [id number]  HPI I have personally reviewed the Medicare Annual Wellness questionnaire and have noted 1. The patient's medical and social history 2. Their use of alcohol, tobacco or illicit drugs 3. Their current medications and supplements 4. The patient's functional ability including ADL's, fall risks, home safety risks and hearing or visual             impairment. 5. Diet and physical activities 6. Evidence for depression or mood disorders The patients weight, height, BMI and visual acuity have been recorded in the chart I have made referrals, counseling and provided education to the patient based review of the above and I have provided the pt with a written personalized care plan for preventive services.    Review of Systems     Objective:   Physical Exam        Assessment & Plan:  The patient's preventative maintenance and recommended screening tests for an annual wellness exam were reviewed in full today. Brought up to date unless services declined.  Counselled on the importance of diet, exercise, and its role in overall health and mortality. The patient's FH and SH was reviewed, including their home life, tobacco status, and drug and alcohol status.

## 2012-06-05 NOTE — Addendum Note (Signed)
Addended by: Alvina Chou on: 06/05/2012 12:22 PM   Modules accepted: Orders

## 2012-06-06 LAB — COMPREHENSIVE METABOLIC PANEL
Albumin: 4.2 g/dL (ref 3.5–5.2)
Alkaline Phosphatase: 36 U/L — ABNORMAL LOW (ref 39–117)
BUN: 18 mg/dL (ref 6–23)
CO2: 26 mEq/L (ref 19–32)
GFR: 54.76 mL/min — ABNORMAL LOW (ref >60.00)
Glucose, Bld: 96 mg/dL (ref 70–99)
Potassium: 4.2 mEq/L (ref 3.5–5.1)
Sodium: 140 mEq/L (ref 135–145)
Total Protein: 6.9 g/dL (ref 6.0–8.3)

## 2012-06-06 LAB — LIPID PANEL
HDL: 148.6 mg/dL (ref >39.00)
Triglycerides: 48 mg/dL (ref 0.0–149.0)

## 2012-06-09 LAB — LDL CHOLESTEROL, DIRECT: Direct LDL: 75.5 mg/dL

## 2012-06-10 ENCOUNTER — Encounter: Payer: Self-pay | Admitting: Family Medicine

## 2012-06-15 ENCOUNTER — Other Ambulatory Visit: Payer: Self-pay | Admitting: Family Medicine

## 2012-07-03 ENCOUNTER — Encounter: Payer: Self-pay | Admitting: Family Medicine

## 2012-07-03 ENCOUNTER — Telehealth: Payer: Self-pay

## 2012-07-03 ENCOUNTER — Ambulatory Visit (INDEPENDENT_AMBULATORY_CARE_PROVIDER_SITE_OTHER): Payer: Medicare Other | Admitting: Family Medicine

## 2012-07-03 VITALS — BP 130/80 | HR 83 | Temp 97.4°F | Ht 61.0 in | Wt 112.0 lb

## 2012-07-03 DIAGNOSIS — J441 Chronic obstructive pulmonary disease with (acute) exacerbation: Secondary | ICD-10-CM | POA: Insufficient documentation

## 2012-07-03 MED ORDER — PREDNISONE 20 MG PO TABS: ORAL_TABLET | ORAL | Status: DC

## 2012-07-03 MED ORDER — MOXIFLOXACIN HCL 400 MG PO TABS: 400.0000 mg | ORAL_TABLET | Freq: Every day | ORAL | Status: DC

## 2012-07-03 MED ORDER — AZITHROMYCIN 250 MG PO TABS: ORAL_TABLET | ORAL | Status: DC

## 2012-07-03 NOTE — Assessment & Plan Note (Signed)
Sputum change so will cover with an antibiotics. Mucinex DM . Hold prednione given minimal wheeze at this point but if not improving start.

## 2012-07-03 NOTE — Telephone Encounter (Signed)
Patient advised.  Rx sent to pharmacy.  Patient says that the Azithromax was clearly on her chart and she is unhappy with it having not been noticed.

## 2012-07-03 NOTE — Patient Instructions (Addendum)
Use albuterol as needed for wheeze or chest tightness. Mucinex DM twice daily ( no decongestant) Start antibiotics.  Hold prednsione taper.. No need to start unless  SOB, chest tightness and wheezing increases or if needing albuterol more than every 4 hours.

## 2012-07-03 NOTE — Telephone Encounter (Signed)
[facility] [address] left v/m; Dr Ermalene Searing prescribed Azithromycin today; Dr Kendrick Fries has pt on azithromycin 250 mg daily for several months at least pt on med since 02/2012. Wynona Canes wanted to know if Dr Ermalene Searing wanted pt on different antibiotic?Please advise.

## 2012-07-03 NOTE — Progress Notes (Signed)
  Subjective:    Patient ID: Misty Ferguson, female    DOB: 10-30-1933, 78 y.o.   MRN: [id number]  HPI 78 year old female with moderate severe COPD, former smoker on bedtime oxygen presents with  2-3 days of dry cough, sneeze.  Now today nasal congestion, headache. Now today more wet cough, sputum change.  Some increase in SOB and wheeze. No fever.  She is concerned that if she does not do anything she will get worse.   Not needing albuterol at this point. But forgot abpout it.  On Siriva and Advair for control.   Review of Systems  Constitutional: Positive for fatigue. Negative for fever.  HENT: Negative for ear pain.   Eyes: Negative for pain.  Respiratory: Positive for shortness of breath and wheezing. Negative for chest tightness.   Cardiovascular: Negative for chest pain, palpitations and leg swelling.  Gastrointestinal: Negative for abdominal pain.  Genitourinary: Negative for dysuria.       Objective:   Physical Exam  Constitutional: Vital signs are normal. She appears well-developed and well-nourished. She is cooperative.  Non-toxic appearance. She does not appear ill. No distress.  HENT:  Head: Normocephalic.  Right Ear: Hearing, tympanic membrane, external ear and ear canal normal. Tympanic membrane is not erythematous, not retracted and not bulging.  Left Ear: Hearing, tympanic membrane, external ear and ear canal normal. Tympanic membrane is not erythematous, not retracted and not bulging.  Nose: Mucosal edema and rhinorrhea present. Right sinus exhibits no maxillary sinus tenderness and no frontal sinus tenderness. Left sinus exhibits no maxillary sinus tenderness and no frontal sinus tenderness.  Mouth/Throat: Uvula is midline, oropharynx is clear and moist and mucous membranes are normal.  Eyes: Conjunctivae normal, EOM and lids are normal. Pupils are equal, round, and reactive to light. No foreign bodies found.  Neck: Trachea normal and normal range of motion. Neck  supple. Carotid bruit is not present. No mass and no thyromegaly present.  Cardiovascular: Normal rate, regular rhythm, S1 normal, S2 normal, normal heart sounds, intact distal pulses and normal pulses.  Exam reveals no gallop and no friction rub.   No murmur heard. Pulmonary/Chest: Effort normal. Not tachypneic. No respiratory distress. She has decreased breath sounds. She has no wheezes. She has no rhonchi. She has no rales.       No celar wheeze on exam today  Neurological: She is alert.  Skin: Skin is warm, dry and intact. No rash noted.  Psychiatric: Her speech is normal and behavior is normal. Judgment normal. Her mood appears not anxious. Cognition and memory are normal. She does not exhibit a depressed mood.          Assessment & Plan:

## 2012-07-03 NOTE — Telephone Encounter (Signed)
I was not aware she is already on antibiotics. Lets do 5 day course of avelox.  Please let pot and pharmacy know.

## 2012-07-04 ENCOUNTER — Encounter: Payer: Self-pay | Admitting: Family Medicine

## 2012-07-17 DIAGNOSIS — R002 Palpitations: Secondary | ICD-10-CM

## 2012-07-23 ENCOUNTER — Ambulatory Visit: Payer: Self-pay | Admitting: Ophthalmology

## 2012-07-30 ENCOUNTER — Telehealth: Payer: Self-pay

## 2012-07-30 ENCOUNTER — Telehealth: Payer: Self-pay | Admitting: Pulmonary Disease

## 2012-07-30 DIAGNOSIS — J449 Chronic obstructive pulmonary disease, unspecified: Secondary | ICD-10-CM

## 2012-07-30 MED ORDER — AZITHROMYCIN 250 MG PO TABS: ORAL_TABLET | ORAL | Status: DC

## 2012-07-30 NOTE — Telephone Encounter (Signed)
I spoke with spouse and he is requesting we [facility] [place] for oxygen concentrator. i advised will send order. Phone # [phone] Please advise PCC's thanks.

## 2012-07-30 NOTE — Telephone Encounter (Signed)
pts husband left v/m; [facility] may contact Dr Ermalene Searing about getting prescription for oxygen usage. Pts husband said pt does want to use this company and any questions call pts husband at home #.

## 2012-07-30 NOTE — Telephone Encounter (Signed)
i spoke with pt and is aware i have sent RX into the pharmacy. Nothing further was needed

## 2012-07-30 NOTE — Telephone Encounter (Signed)
Noted. Let me know what else I need to do.

## 2012-07-31 NOTE — Telephone Encounter (Signed)
Order faxed and received conformation Misty Ferguson

## 2012-08-01 ENCOUNTER — Telehealth: Payer: Self-pay | Admitting: Pulmonary Disease

## 2012-08-01 ENCOUNTER — Ambulatory Visit: Payer: Self-pay | Admitting: Ophthalmology

## 2012-08-01 DIAGNOSIS — J449 Chronic obstructive pulmonary disease, unspecified: Secondary | ICD-10-CM

## 2012-08-01 NOTE — Telephone Encounter (Signed)
i spoke with spouse and he needs order to [facility] to cancel there services. Order has been sent. Nothing further was needed

## 2012-08-25 ENCOUNTER — Encounter: Payer: Self-pay | Admitting: Pulmonary Disease

## 2012-08-25 ENCOUNTER — Ambulatory Visit (INDEPENDENT_AMBULATORY_CARE_PROVIDER_SITE_OTHER): Payer: Medicare Other | Admitting: Pulmonary Disease

## 2012-08-25 VITALS — BP 130/80 | HR 74 | Temp 97.7°F | Ht 61.0 in | Wt 114.8 lb

## 2012-08-25 DIAGNOSIS — Z5181 Encounter for therapeutic drug level monitoring: Secondary | ICD-10-CM

## 2012-08-25 DIAGNOSIS — J9610 Chronic respiratory failure, unspecified whether with hypoxia or hypercapnia: Secondary | ICD-10-CM

## 2012-08-25 DIAGNOSIS — J9621 Acute and chronic respiratory failure with hypoxia: Secondary | ICD-10-CM | POA: Insufficient documentation

## 2012-08-25 DIAGNOSIS — J449 Chronic obstructive pulmonary disease, unspecified: Secondary | ICD-10-CM

## 2012-08-25 DIAGNOSIS — J9611 Chronic respiratory failure with hypoxia: Secondary | ICD-10-CM

## 2012-08-25 NOTE — Progress Notes (Deleted)
Subjective:    Patient ID: Misty Ferguson, female    DOB: [birth year], 79 y.o.   MRN: [id number]  HPI   Misty Ferguson is a pleasant female with COPD due to a 45 pack year smoking history who comes to our clinic to establish pulmonary care for the same.  She developed COPD 15 years ago and was diagnosed by hier PCP.  She has been evaluated by Dr. Mayo Ao for the same 5 years ago.  Her most significant symptom over the years has been shortness of breath on exertion which has been gradually increasing over the years.  Despite this she is able to walk 2 miles daily without difficulty.  She states taht she gets short of breath with fast walking or heavy exertion.  She typically has bronchitis or a flare of COPD annually, and has developed pneumonia several times.  Her last round of steroids was in January 2012.  She has been hospitalized for pneumonia.  She does not have a chronic cough.  She uses oxygen when exerting herself.  She is compliant with her inhalers.     Past Medical History  Diagnosis Date  . Unspecified disorder resulting from impaired renal function   . Type II or unspecified type diabetes mellitus without mention of complication, not stated as uncontrolled   . Obstructive chronic bronchitis with exacerbation   . Tobacco use disorder   . Unspecified glaucoma(365.9)   . Macular degeneration (senile) of retina, unspecified   . Personal history of peptic ulcer disease   . Unspecified essential hypertension   . Chronic airway obstruction, not elsewhere classified   . Osteoporosis, unspecified      No family history on file.   History   Social History  . Marital Status: Married    Spouse Name: N/A    Number of Children: 3  . Years of Education: N/A   Occupational History  . RETIRED    Social History Main Topics  . Smoking status: Former Smoker -- 1.3 packs/day for 45 years    Types: Cigarettes    Quit date: 08/14/2003  . Smokeless tobacco: Never Used  . Alcohol Use: Yes    Comment: Occasional  . Drug Use: No  . Sexually Active: Not on file   Other Topics Concern  . Not on file   Social History Narrative   Tobacco abuse x 45 yearsHas living will, HCPOA: Bradley Ferris.  Full code. (Reviewed 2013)     No Known Allergies   Outpatient Prescriptions Prior to Visit  Medication Sig Dispense Refill  . ADVAIR DISKUS 250-50 MCG/DOSE AEPB INHALE ONE DOSE BY MOUTH TWICE DAILY  60 each  5  . albuterol (ACCUNEB) 1.25 MG/3ML nebulizer solution Take 3 mLs (1.25 mg total) by nebulization every 6 (six) hours as needed.  75 mL  11  . aspirin 81 MG tablet Take 81 mg by mouth every other day.       Marland Kitchen azithromycin (ZITHROMAX) 250 MG tablet Once a day  30 tablet  0  . Calcium Carbonate-Vitamin D (CALCIUM 600/VITAMIN D) 600-400 MG-UNIT per tablet Take 1 tablet by mouth 2 (two) times daily.        Marland Kitchen losartan (COZAAR) 50 MG tablet TAKE ONE TABLET BY MOUTH EVERY DAY  30 tablet  6  . Multiple Vitamins-Minerals (PRESERVISION AREDS 2 PO) Take by mouth 2 (two) times daily.      . Omega-3 Fatty Acids (GNP FISH OIL CONCENTRATE) 1000 MG CAPS Take 1 capsule by mouth daily.        Marland Kitchen  SPIRIVA HANDIHALER 18 MCG inhalation capsule INHALE CONTENTS OF ONE CAPSULE EVERY DAY  90 each  3  . [DISCONTINUED] moxifloxacin (AVELOX) 400 MG tablet Take 1 tablet (400 mg total) by mouth daily.  5 tablet  0  . [DISCONTINUED] moxifloxacin (AVELOX) 400 MG tablet Take 1 tablet (400 mg total) by mouth daily.  5 tablet  0  . [DISCONTINUED] predniSONE (DELTASONE) 20 MG tablet 3 tabs by mouth daily x 3 days, then 2 tabs by mouth daily x 2 days then 1 tab by mouth daily x 2 days  15 tablet  0  Last reviewed on 08/25/2012  8:59 AM by Christen Butter, CMA    Review of Systems  Constitutional: Negative for fever, chills and unexpected weight change.  HENT: Negative for ear pain, nosebleeds, congestion, sore throat, rhinorrhea, sneezing, trouble swallowing, dental problem, voice change, postnasal drip and sinus pressure.    Eyes: Negative for visual disturbance.  Respiratory: Positive for shortness of breath. Negative for cough and choking.   Cardiovascular: Negative for chest pain and leg swelling.  Gastrointestinal: Negative for vomiting, abdominal pain and diarrhea.  Genitourinary: Negative for difficulty urinating.  Musculoskeletal: Positive for arthralgias.  Skin: Negative for rash.  Neurological: Negative for tremors, syncope and headaches.  Hematological: Bruises/bleeds easily.       Objective:   Physical Exam   Filed Vitals:   08/25/12 0901  BP: 130/80  Pulse: 74  Temp: 97.7 F (36.5 C)  TempSrc: Oral  Height: 5\' 1"  (1.549 m)  Weight: 114 lb 12.8 oz (52.073 kg)  SpO2: 96%   Gen: well appearing, no acute distress HEENT: NCAT, PERRL, EOMi, OP clear, neck supple without masses PULM: Poor air movement bilaterally, no wheeze CV: RRR, slight systolic murmur, no JVD AB: BS+, soft, nontender, no hsm Ext: warm, no edema, no clubbing, no cyanosis Derm: no rash or skin breakdown Neuro: A&Ox4, CN II-XII intact, strength 5/5 in all 4 extremities  01/2012 Spirometry: Ratio 36% FEV1 0.85L (46% pred)  01/2012 CXR (My read) hyperinflated lungs, patchy airspace disease in the LUL     Assessment & Plan:   No problem-specific assessment & plan notes found for this encounter.   Updated Medication List Outpatient Encounter Prescriptions as of 08/25/2012  Medication Sig Dispense Refill  . ADVAIR DISKUS 250-50 MCG/DOSE AEPB INHALE ONE DOSE BY MOUTH TWICE DAILY  60 each  5  . albuterol (ACCUNEB) 1.25 MG/3ML nebulizer solution Take 3 mLs (1.25 mg total) by nebulization every 6 (six) hours as needed.  75 mL  11  . aspirin 81 MG tablet Take 81 mg by mouth every other day.       Marland Kitchen azithromycin (ZITHROMAX) 250 MG tablet Once a day  30 tablet  0  . Calcium Carbonate-Vitamin D (CALCIUM 600/VITAMIN D) 600-400 MG-UNIT per tablet Take 1 tablet by mouth 2 (two) times daily.        Marland Kitchen losartan (COZAAR) 50 MG  tablet TAKE ONE TABLET BY MOUTH EVERY DAY  30 tablet  6  . Multiple Vitamins-Minerals (PRESERVISION AREDS 2 PO) Take by mouth 2 (two) times daily.      . Omega-3 Fatty Acids (GNP FISH OIL CONCENTRATE) 1000 MG CAPS Take 1 capsule by mouth daily.        . prednisoLONE acetate (PRED FORTE) 1 % ophthalmic suspension Place 1 drop into the right eye 2 (two) times daily.      Marland Kitchen SPIRIVA HANDIHALER 18 MCG inhalation capsule INHALE CONTENTS OF ONE CAPSULE EVERY  DAY  90 each  3  . [DISCONTINUED] moxifloxacin (AVELOX) 400 MG tablet Take 1 tablet (400 mg total) by mouth daily.  5 tablet  0  . [DISCONTINUED] moxifloxacin (AVELOX) 400 MG tablet Take 1 tablet (400 mg total) by mouth daily.  5 tablet  0  . [DISCONTINUED] predniSONE (DELTASONE) 20 MG tablet 3 tabs by mouth daily x 3 days, then 2 tabs by mouth daily x 2 days then 1 tab by mouth daily x 2 days  15 tablet  0

## 2012-08-25 NOTE — Progress Notes (Signed)
Subjective:    Patient ID: Misty Ferguson, female    DOB: 05/25/1934, 79 y.o.   MRN: [id number]  Synopsis: Misty Ferguson was first seen by the [facility] pulmonary clinic in the summer of 2013. She has COPD. Simple spirometry performed in clinic showed an FEV1 of 45% predicted with clear obstruction. She smoked 1-1/3 pack of cigarettes daily through 2005. She is a frequent exacerbation phenotype. She uses 2 L of oxygen continuously with exertion.  HPI  08/25/2012 ROV -- This is been a very stable interval for [name]. She notes that she is continuing to use the Advair and the Spiriva regularly. She continues to use her oxygen on a regular basis. She states that she has not had bronchitis this year like she typically has by this point and the year. She continues to take the azithromycin every day. She is working out either in her garage or at her local gym. She says that she can walk at least 2 miles daily.  Past Medical History  Diagnosis Date  . Unspecified disorder resulting from impaired renal function   . Type II or unspecified type diabetes mellitus without mention of complication, not stated as uncontrolled   . Obstructive chronic bronchitis with exacerbation   . Tobacco use disorder   . Unspecified glaucoma(365.9)   . Macular degeneration (senile) of retina, unspecified   . Personal history of peptic ulcer disease   . Unspecified essential hypertension   . Chronic airway obstruction, not elsewhere classified   . Osteoporosis, unspecified     Review of Systems     Objective:   Physical Exam  Filed Vitals:   08/25/12 0901  BP: 130/80  Pulse: 74  Temp: 97.7 F (36.5 C)  TempSrc: Oral  Height: 5\' 1"  (1.549 m)  Weight: 114 lb 12.8 oz (52.073 kg)  SpO2: 96%   Gen: well appearing, no acute distress HEENT: NCAT, PERRL, EOMi,  PULM: very diminished breath sounds, no wheezing CV: RRR, no mgr, no JVD AB: BS+, soft, nontender, no hsm Ext: warm, no edema, no clubbing, no  cyanosis       Assessment & Plan:   COPD Ms. Mol has severe COPD with frequent exacerbations. Since starting the azithromycin she says that she has not had the typical severe bronchitis flares that she used to have years ago. She has several questions today about the chronic azithromycin use and is concerned about reports in the media regarding cardiac arrhythmias. She is not interested in using the Roflumilast due to cost. In general this is been a very stable interval for her.  At this point her lab work and her EKGs have all suggested no adverse effect from the azithromycin. I am reassured by the fact that there are several exceedingly large observational studies which is shown no increase risk of cardiac abnormalities in patients with azithromycin use.  Plan: -I spent a significant amount of time reassuring her and providing her with literature regarding the appropriate use of azithromycin for patients with frequent exacerbations from COPD. I also provided her with information regarding the safety of azithromycin.-Flu shot is up-to-date -Continue Advair and Spiriva -Continue azithromycin    Chronic hypoxemic respiratory failure Continue 2 L of oxygen with exertion.    Updated Medication List Outpatient Encounter Prescriptions as of 08/25/2012  Medication Sig Dispense Refill  . ADVAIR DISKUS 250-50 MCG/DOSE AEPB INHALE ONE DOSE BY MOUTH TWICE DAILY  60 each  5  . albuterol (ACCUNEB) 1.25 MG/3ML nebulizer solution Take 3 mLs (  1.25 mg total) by nebulization every 6 (six) hours as needed.  75 mL  11  . aspirin 81 MG tablet Take 81 mg by mouth every other day.       Marland Kitchen azithromycin (ZITHROMAX) 250 MG tablet Once a day  30 tablet  0  . Calcium Carbonate-Vitamin D (CALCIUM 600/VITAMIN D) 600-400 MG-UNIT per tablet Take 1 tablet by mouth 2 (two) times daily.        Marland Kitchen losartan (COZAAR) 50 MG tablet TAKE ONE TABLET BY MOUTH EVERY DAY  30 tablet  6  . Multiple Vitamins-Minerals  (PRESERVISION AREDS 2 PO) Take by mouth 2 (two) times daily.      . Omega-3 Fatty Acids (GNP FISH OIL CONCENTRATE) 1000 MG CAPS Take 1 capsule by mouth daily.        . prednisoLONE acetate (PRED FORTE) 1 % ophthalmic suspension Place 1 drop into the right eye 2 (two) times daily.      Marland Kitchen SPIRIVA HANDIHALER 18 MCG inhalation capsule INHALE CONTENTS OF ONE CAPSULE EVERY DAY  90 each  3  . [DISCONTINUED] moxifloxacin (AVELOX) 400 MG tablet Take 1 tablet (400 mg total) by mouth daily.  5 tablet  0  . [DISCONTINUED] moxifloxacin (AVELOX) 400 MG tablet Take 1 tablet (400 mg total) by mouth daily.  5 tablet  0  . [DISCONTINUED] predniSONE (DELTASONE) 20 MG tablet 3 tabs by mouth daily x 3 days, then 2 tabs by mouth daily x 2 days then 1 tab by mouth daily x 2 days  15 tablet  0

## 2012-08-25 NOTE — Assessment & Plan Note (Signed)
Ms. Rickett has severe COPD with frequent exacerbations. Since starting the azithromycin she says that she has not had the typical severe bronchitis flares that she used to have years ago. She has several questions today about the chronic azithromycin use and is concerned about reports in the media regarding cardiac arrhythmias. She is not interested in using the Roflumilast due to cost. In general this is been a very stable interval for her.  At this point her lab work and her EKGs have all suggested no adverse effect from the azithromycin. I am reassured by the fact that there are several exceedingly large observational studies which is shown no increase risk of cardiac abnormalities in patients with azithromycin use.  Plan: -I spent a significant amount of time reassuring her and providing her with literature regarding the appropriate use of azithromycin for patients with frequent exacerbations from COPD. I also provided her with information regarding the safety of azithromycin.-Flu shot is up-to-date -Continue Advair and Spiriva -Continue azithromycin

## 2012-08-25 NOTE — Patient Instructions (Signed)
Keep taking the azithromycin as you are doing Keep taking the Spiriva and Advair as you are doing Use the oxygen with exertion and when you sleep at 2L We will see you back in 3 months or sooner if needed

## 2012-08-25 NOTE — Assessment & Plan Note (Signed)
Continue 2 L of oxygen with exertion.

## 2012-08-26 ENCOUNTER — Other Ambulatory Visit: Payer: Medicare Other

## 2012-08-26 ENCOUNTER — Telehealth: Payer: Self-pay | Admitting: Pulmonary Disease

## 2012-08-26 LAB — COMPREHENSIVE METABOLIC PANEL
AST: 28 U/L (ref 0–37)
Alkaline Phosphatase: 40 U/L (ref 39–117)
BUN: 28 mg/dL — ABNORMAL HIGH (ref 6–23)
Creat: 1.12 mg/dL — ABNORMAL HIGH (ref 0.50–1.10)
Glucose, Bld: 92 mg/dL (ref 70–99)

## 2012-08-26 NOTE — Telephone Encounter (Signed)
Result Note     L,      Please let her know that her labs were OK      B   ---  I spoke with patient about results and she verbalized understanding and had no questions

## 2012-09-02 ENCOUNTER — Encounter: Payer: Medicare Other | Admitting: Family Medicine

## 2012-09-15 ENCOUNTER — Telehealth: Payer: Self-pay | Admitting: Pulmonary Disease

## 2012-09-15 MED ORDER — AZITHROMYCIN 250 MG PO TABS: ORAL_TABLET | ORAL | Status: DC

## 2012-09-15 NOTE — Telephone Encounter (Signed)
Called, spoke with pt. Requesting azithromycin rx to be filed.  States she is taking this qd.  Per last OV with BQ, pt is to cont with azithromycin.  Rx sent to [facility].  Pt aware and voiced no further questions or concerns at this time.

## 2012-10-06 ENCOUNTER — Other Ambulatory Visit: Payer: Self-pay | Admitting: Family Medicine

## 2012-10-15 ENCOUNTER — Telehealth: Payer: Self-pay | Admitting: Pulmonary Disease

## 2012-10-15 ENCOUNTER — Telehealth: Payer: Self-pay | Admitting: Family Medicine

## 2012-10-15 MED ORDER — AZITHROMYCIN 250 MG PO TABS: ORAL_TABLET | ORAL | Status: DC

## 2012-10-15 NOTE — Telephone Encounter (Signed)
She needs oxygen at 2L continously during flight. She is medically fit to travel. Let me know if we need to write a letter stating this.

## 2012-10-15 NOTE — Telephone Encounter (Signed)
Dr. Kendrick Fries, the pt needs letter to fly with o2 She is currently just using o2 with exertion and at hs Will this change on the plane?  Please advise thanks!

## 2012-10-15 NOTE — Telephone Encounter (Signed)
Pt spouse came in today   Stating they were going to [place] in the next month and needs letter stating she need 02 and that she can carry this on the plane.  She will be taking her concentrator Please advise pt when this is ready

## 2012-10-15 NOTE — Telephone Encounter (Signed)
Refill has been sent to the pharmacy for the pt. Per BQ last ov note the pt was to stay on this medication nothing further is needed

## 2012-10-16 ENCOUNTER — Encounter: Payer: Self-pay | Admitting: *Deleted

## 2012-10-16 NOTE — Telephone Encounter (Deleted)
Patient

## 2012-10-16 NOTE — Telephone Encounter (Signed)
Spoke with pt She states that she does need a letter mailed to her I created the letter and mailed to her home address which I have verified with her Nothing further needed per pt

## 2012-10-16 NOTE — Telephone Encounter (Signed)
LMTCB for pt 

## 2012-10-16 NOTE — Telephone Encounter (Signed)
Patient returning call.

## 2012-11-02 ENCOUNTER — Other Ambulatory Visit: Payer: Self-pay | Admitting: Family Medicine

## 2012-11-05 ENCOUNTER — Encounter: Payer: Self-pay | Admitting: Family Medicine

## 2012-11-05 ENCOUNTER — Ambulatory Visit (INDEPENDENT_AMBULATORY_CARE_PROVIDER_SITE_OTHER): Payer: Medicare Other | Admitting: Family Medicine

## 2012-11-05 VITALS — BP 120/64 | HR 90 | Temp 97.4°F | Ht 61.0 in | Wt 112.0 lb

## 2012-11-05 DIAGNOSIS — J441 Chronic obstructive pulmonary disease with (acute) exacerbation: Secondary | ICD-10-CM

## 2012-11-05 DIAGNOSIS — J449 Chronic obstructive pulmonary disease, unspecified: Secondary | ICD-10-CM

## 2012-11-05 NOTE — Patient Instructions (Addendum)
Take Guaifenesin (400mg), take 11/2 tabs by mouth AM and NOON. Get GUAIFENESIN by  [facility] to [facility] or [facility] and getting MUCOUS RELIEF EXPECTORANT/CONGESTION. DO NOT GET MUCINEX (Timed Release Guaifenesin)  

## 2012-11-05 NOTE — Progress Notes (Signed)
Nature conservation officer at [facility] [address] [place] [address] Phone: [phone] Fax: [phone]  Date:  11/05/2012   Name:  Misty Ferguson   DOB:  Aug 23, 1933   MRN:  [id number] Gender: female Age: 79 y.o.  Primary Physician:  Kerby Nora, MD  Evaluating MD: Hannah Beat, MD   Chief Complaint: Cough   History of Present Illness:  Misty Ferguson is a 79 y.o. pleasant patient who presents with the following:  Coughing fits and mucous fits. Travelling to [facility]. Over the last week, she has had worsening cough and sputum production, but she is at 97% o2 right now on room air. AF, and she is on daily azithromycin for COPD regimen.   No n/v/d, no rashes, sore throat, or other sx.  Wears o2 only at night. And prn.  Patient Active Problem List  Diagnosis  . HERPES ZOSTER  . DM, UNCOMPLICATED, TYPE II  . HYPERCHOLESTEROLEMIA  . MACULAR DEGENERATION  . GLAUCOMA  . HYPERTENSION  . COPD  . OSTEOPOROSIS  . MICROALBUMINURIA  . CAP (community acquired pneumonia)  . Hypoxia  . Quadriceps contusion  . COPD exacerbation  . Chronic hypoxemic respiratory failure    Past Medical History  Diagnosis Date  . Unspecified disorder resulting from impaired renal function   . Type II or unspecified type diabetes mellitus without mention of complication, not stated as uncontrolled   . Obstructive chronic bronchitis with exacerbation   . Tobacco use disorder   . Unspecified glaucoma(365.9)   . Macular degeneration (senile) of retina, unspecified   . Personal history of peptic ulcer disease   . Unspecified essential hypertension   . Chronic airway obstruction, not elsewhere classified   . Osteoporosis, unspecified     No past surgical history on file.  History   Social History  . Marital Status: Married    Spouse Name: N/A    Number of Children: 3  . Years of Education: N/A   Occupational History  . RETIRED    Social History Main Topics  . Smoking status:  Former Smoker -- 1.30 packs/day for 45 years    Types: Cigarettes    Quit date: 08/14/2003  . Smokeless tobacco: Never Used  . Alcohol Use: Yes     Comment: Occasional  . Drug Use: No  . Sexually Active: Not on file   Other Topics Concern  . Not on file   Social History Narrative   Tobacco abuse x 45 years   Has living will, HCPOA: Bradley Ferris.  Full code. (Reviewed 2013)    No family history on file.  No Known Allergies  Medication list has been reviewed and updated.  Outpatient Prescriptions Prior to Visit  Medication Sig Dispense Refill  . ADVAIR DISKUS 250-50 MCG/DOSE AEPB INHALE ONE DOSE BY MOUTH TWICE DAILY  60 each  5  . albuterol (ACCUNEB) 1.25 MG/3ML nebulizer solution Take 3 mLs (1.25 mg total) by nebulization every 6 (six) hours as needed.  75 mL  11  . aspirin 81 MG tablet Take 81 mg by mouth every other day.       Marland Kitchen azithromycin (ZITHROMAX) 250 MG tablet Once a day  30 tablet  0  . Calcium Carbonate-Vitamin D (CALCIUM 600/VITAMIN D) 600-400 MG-UNIT per tablet Take 1 tablet by mouth 2 (two) times daily.        . Multiple Vitamins-Minerals (PRESERVISION AREDS 2 PO) Take by mouth 2 (two) times daily.      . Omega-3  Fatty Acids (GNP FISH OIL CONCENTRATE) 1000 MG CAPS Take 1 capsule by mouth daily.        . prednisoLONE acetate (PRED FORTE) 1 % ophthalmic suspension Place 1 drop into the right eye 2 (two) times daily.      Marland Kitchen SPIRIVA HANDIHALER 18 MCG inhalation capsule INHALE CONTENTS OF ONE CAPSULE EVERY DAY  90 each  3  . losartan (COZAAR) 50 MG tablet TAKE ONE TABLET BY MOUTH ONCE DAILY  30 tablet  0   No facility-administered medications prior to visit.    Review of Systems:  ROS: GEN: Acute illness details above GI: Tolerating PO intake GU: maintaining adequate hydration and urination Pulm: sob with exertion. Interactive and getting along well at home.  Otherwise, ROS is as per the HPI.   Physical Examination: BP 120/64  Pulse 90  Temp(Src) 97.4 F  (36.3 C) (Oral)  Ht 5\' 1"  (1.549 m)  Wt 112 lb (50.803 kg)  BMI 21.17 kg/m2  SpO2 97%  Ideal Body Weight: Weight in (lb) to have BMI = 25: 132   GEN: A and O x 3. WDWN. NAD.    ENT: Nose clear, ext NML.  No LAD.  No JVD.  TM's clear. Oropharynx clear.  PULM: Normal WOB, no distress. Distant BS, rare wheeze, but no crackles. CV: RRR, no M/G/R, No rubs, No JVD.   EXT: warm and well-perfused, No c/c/e. PSYCH: Pleasant and conversant.   Assessment and Plan:  COPD exacerbation  COPD  Suspect COPD exacerbation. Albuterol prn. Patient does not think unstable enough for steroids.  Add high dose guaif Rare use of sudafed o2 if needed  Orders Today:  No orders of the defined types were placed in this encounter.    Updated Medication List: (Includes new medications, updates to list, dose adjustments) No orders of the defined types were placed in this encounter.    Medications Discontinued: There are no discontinued medications.    Signed, Elpidio Galea. Keiri Solano, MD 11/05/2012 12:02 PM

## 2012-11-17 ENCOUNTER — Telehealth: Payer: Self-pay | Admitting: Pulmonary Disease

## 2012-11-17 MED ORDER — AZITHROMYCIN 250 MG PO TABS: ORAL_TABLET | ORAL | Status: DC

## 2012-11-17 NOTE — Telephone Encounter (Signed)
Spoke with patient, patient states she is on azithromycin daily (OV note 08/25/12) Patient states she has been having diff filling rx month to month-- seems to be lack of communication between our office and [facility] Patient is requesting for abx to have more refills when sent to pharmacy. I have sent in rx for 30 day supply--- #30 0 refills Dr. Kendrick Fries, may we add more refills next time rx is sent in to patients pharmacy?   Last OV:08/25/12  Patient Instructions    Keep taking the azithromycin as you are doing  Keep taking the Spiriva and Advair as you are doing  Use the oxygen with exertion and when you sleep at 2L  We will see you back in 3 months or sooner if needed

## 2012-11-19 NOTE — Telephone Encounter (Signed)
Yes, this is a chronic med for her.  She needs it daily, so OK to give with 4 refills or even a 90 day supply.

## 2012-11-20 MED ORDER — AZITHROMYCIN 250 MG PO TABS: ORAL_TABLET | ORAL | Status: DC

## 2012-11-20 NOTE — Telephone Encounter (Signed)
Spoke with the pt and she wants 90 day supply, rx sent. Carron Curie, CMA

## 2012-11-20 NOTE — Telephone Encounter (Signed)
[id number] to see if pt wants [place] to send a 90 day supply? Carron Curie, CMA

## 2012-11-26 ENCOUNTER — Other Ambulatory Visit: Payer: Self-pay | Admitting: Family Medicine

## 2012-12-23 ENCOUNTER — Ambulatory Visit (INDEPENDENT_AMBULATORY_CARE_PROVIDER_SITE_OTHER): Payer: Medicare Other | Admitting: Pulmonary Disease

## 2012-12-23 VITALS — BP 114/72 | HR 81 | Temp 98.1°F | Ht 61.0 in | Wt 111.0 lb

## 2012-12-23 DIAGNOSIS — Z5181 Encounter for therapeutic drug level monitoring: Secondary | ICD-10-CM

## 2012-12-23 DIAGNOSIS — J9610 Chronic respiratory failure, unspecified whether with hypoxia or hypercapnia: Secondary | ICD-10-CM

## 2012-12-23 DIAGNOSIS — J449 Chronic obstructive pulmonary disease, unspecified: Secondary | ICD-10-CM

## 2012-12-23 DIAGNOSIS — J309 Allergic rhinitis, unspecified: Secondary | ICD-10-CM

## 2012-12-23 DIAGNOSIS — J9611 Chronic respiratory failure with hypoxia: Secondary | ICD-10-CM

## 2012-12-23 LAB — COMPREHENSIVE METABOLIC PANEL
Alkaline Phosphatase: 39 U/L (ref 39–117)
BUN: 30 mg/dL — ABNORMAL HIGH (ref 6–23)
CO2: 26 mEq/L (ref 19–32)
Creat: 1.26 mg/dL — ABNORMAL HIGH (ref 0.50–1.10)
Glucose, Bld: 91 mg/dL (ref 70–99)
Sodium: 138 mEq/L (ref 135–145)
Total Bilirubin: 0.5 mg/dL (ref 0.3–1.2)
Total Protein: 6.7 g/dL (ref 6.0–8.3)

## 2012-12-23 NOTE — Assessment & Plan Note (Signed)
Continue 2L with exertion 

## 2012-12-23 NOTE — Assessment & Plan Note (Signed)
Start OTC zyrtec daily.  If her symptoms are too bad she can start Nasacort OTC 2 puffs each nostril daily.

## 2012-12-23 NOTE — Progress Notes (Signed)
Subjective:    Patient ID: Misty Ferguson, female    DOB: 01-09-1934, 79 y.o.   MRN: [id number]  Synopsis: Cola Gane was first seen by the [facility] pulmonary clinic in the summer of 2013. She has COPD. Simple spirometry performed in clinic showed an FEV1 of 45% predicted with clear obstruction. She smoked 1-1/3 pack of cigarettes daily through 2005. She is a frequent exacerbation phenotype. She uses 2 L of oxygen continuously with exertion.  HPI   08/25/2012 ROV -- This is been a very stable interval for Misty Ferguson. She notes that she is continuing to use the Advair and the Spiriva regularly. She continues to use her oxygen on a regular basis. She states that she has not had bronchitis this year like she typically has by this point and the year. She continues to take the azithromycin every day. She is working out either in her garage or at her local gym. She says that she can walk at least 2 miles daily.  12/23/12 ROV -- Jenayah has been doing great since her last visit. She survived the winter without having a flare of COPD. She has not had any trouble hearing since being on the azithromycin. For the first time in her life she's noticed allergy symptoms this spring and that for the last several weeks she has had increased runny nose, itchy eyes, and scratchy throat. Her shortness of breath is doing fine. She is frustrated with the cost of Advair and Spiriva. She is asking about one of the newer combined long acting bronchodilator and inhaled corticosteroid agents. She continues to use oxygen with exertion.  Past Medical History  Diagnosis Date  . Unspecified disorder resulting from impaired renal function   . Type II or unspecified type diabetes mellitus without mention of complication, not stated as uncontrolled   . Obstructive chronic bronchitis with exacerbation   . Tobacco use disorder   . Unspecified glaucoma(365.9)   . Macular degeneration (senile) of retina, unspecified   . Personal  history of peptic ulcer disease   . Unspecified essential hypertension   . Chronic airway obstruction, not elsewhere classified   . Osteoporosis, unspecified     Review of Systems  Constitutional: Negative for fever, chills and fatigue.  HENT: Negative for hearing loss, congestion, rhinorrhea and postnasal drip.   Respiratory: Positive for shortness of breath. Negative for cough and wheezing.   Cardiovascular: Negative for chest pain, palpitations and leg swelling.       Objective:   Physical Exam   Filed Vitals:   12/23/12 1405  BP: 114/72  Pulse: 81  Temp: 98.1 F (36.7 C)  TempSrc: Oral  Height: 5\' 1"  (1.549 m)  Weight: 111 lb (50.349 kg)  SpO2: 93%   room air  Gen: well appearing, no acute distress HEENT: NCAT, PERRL, EOMi,  PULM: Poor air flow but clear to auscultation, no wheezing CV: RRR, no mgr, no JVD AB: BS+, soft, nontender, no hsm Ext: warm, no edema, no clubbing, no cyanosis       Assessment & Plan:   COPD This has been a stable interval for Misty Ferguson.  She did not have any exacerbations last year while taking azithromycin, which I consider a victory.  She is frustrated by the cost of her Advair and Spiriva.  Plan: -change Advair to [facility] for on month with samples (she wants to try daily dosing) -apply for [employer] reimbursement for spiriva -continue Azithromycin -CMET today for Azithromycin monitoring -f/u with [place] in 3 months  Chronic hypoxemic respiratory failure Continue 2 L with exertion  Allergic rhinitis Start OTC zyrtec daily.  If her symptoms are too bad she can start Nasacort OTC 2 puffs each nostril daily.    Updated Medication List Outpatient Encounter Prescriptions as of 12/23/2012  Medication Sig Dispense Refill  . ADVAIR DISKUS 250-50 MCG/DOSE AEPB INHALE ONE DOSE BY MOUTH TWICE DAILY  60 each  5  . albuterol (ACCUNEB) 1.25 MG/3ML nebulizer solution Take 3 mLs (1.25 mg total) by nebulization every 6 (six) hours as needed.  75 mL   11  . aspirin 81 MG tablet Take 81 mg by mouth every other day.       Marland Kitchen azithromycin (ZITHROMAX) 250 MG tablet Once a day  90 tablet  1  . Calcium Carbonate-Vitamin D (CALCIUM 600/VITAMIN D) 600-400 MG-UNIT per tablet Take 1 tablet by mouth 2 (two) times daily.        Marland Kitchen losartan (COZAAR) 50 MG tablet TAKE ONE TABLET BY MOUTH ONCE DAILY  30 tablet  0  . Multiple Vitamins-Minerals (PRESERVISION AREDS 2 PO) Take by mouth 2 (two) times daily.      . Omega-3 Fatty Acids (GNP FISH OIL CONCENTRATE) 1000 MG CAPS Take 1 capsule by mouth daily.        . prednisoLONE acetate (PRED FORTE) 1 % ophthalmic suspension Place 1 drop into the right eye 2 (two) times daily.      Marland Kitchen SPIRIVA HANDIHALER 18 MCG inhalation capsule INHALE CONTENTS OF ONE CAPSULE EVERY DAY  90 each  3   No facility-administered encounter medications on file as of 12/23/2012.

## 2012-12-23 NOTE — Patient Instructions (Signed)
Stop the Advair and try Breo one puff daily for a month If you think you are doing well on it, we will send a prescription for it  Keep taking your other medications as you are doing  You can use Nasacort OTC 2 sprays each nostril daily for worsening allergy symptoms  We will see you back in 3 months or sooner if needed

## 2012-12-23 NOTE — Assessment & Plan Note (Signed)
This has been a stable interval for Misty Ferguson.  She did not have any exacerbations last year while taking azithromycin, which I consider a victory.  She is frustrated by the cost of her Advair and Spiriva.  Plan: -change Advair to [facility] for on month with samples (she wants to try daily dosing) -apply for [employer] reimbursement for spiriva -continue Azithromycin -CMET today for Azithromycin monitoring -f/u with [place] in 3 months

## 2012-12-29 ENCOUNTER — Telehealth: Payer: Self-pay | Admitting: Pulmonary Disease

## 2012-12-29 NOTE — Telephone Encounter (Signed)
Called ans spoke with the pt She states that she felt breo made her feel fatigued and more SOB She states that she stopped taking med 2 days ago and feels better She wants to go back to advair  Please advise thanks!

## 2012-12-30 NOTE — Telephone Encounter (Signed)
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

## 2012-12-30 NOTE — Telephone Encounter (Signed)
Called spoke with patient Advised BQ okayed for her to resume the advair Pt verbalized her understanding and stated that she does not need an rx at this time Advised pt to call when she does need a refill and we would be happy to take care of this for her Nothing further needed; will sign off

## 2012-12-31 ENCOUNTER — Other Ambulatory Visit: Payer: Self-pay | Admitting: Family Medicine

## 2013-02-02 ENCOUNTER — Other Ambulatory Visit: Payer: Self-pay | Admitting: Family Medicine

## 2013-02-25 ENCOUNTER — Other Ambulatory Visit: Payer: Self-pay | Admitting: Family Medicine

## 2013-03-02 ENCOUNTER — Other Ambulatory Visit: Payer: Self-pay | Admitting: Family Medicine

## 2013-03-24 ENCOUNTER — Ambulatory Visit (INDEPENDENT_AMBULATORY_CARE_PROVIDER_SITE_OTHER): Payer: Medicare Other | Admitting: Pulmonary Disease

## 2013-03-24 ENCOUNTER — Encounter: Payer: Self-pay | Admitting: Pulmonary Disease

## 2013-03-24 VITALS — BP 140/80 | HR 85 | Temp 97.7°F | Ht 61.0 in | Wt 112.8 lb

## 2013-03-24 DIAGNOSIS — J449 Chronic obstructive pulmonary disease, unspecified: Secondary | ICD-10-CM

## 2013-03-24 DIAGNOSIS — Z5181 Encounter for therapeutic drug level monitoring: Secondary | ICD-10-CM

## 2013-03-24 NOTE — Patient Instructions (Signed)
Keep taking your medicines and using your oxygen as you are doing We will see you back in 6 months

## 2013-03-24 NOTE — Assessment & Plan Note (Signed)
This has been a stable interval for [facility] as she continues to exercise and has not had another flare since starting the daily azithromycin.  In terms of drug monitoring, her recent LFT's were normal and her EKG today shows no change in her QTc (447).  Plan: -continue regular exercise -continue O2 QHS and with exercise -continue spiriva, advair, azithromycin -f/u 6 months  -I have asked that she have LFT's with her next PCP visit/blood work in December 2014

## 2013-03-24 NOTE — Progress Notes (Signed)
Subjective:    Patient ID: Misty Ferguson, female    DOB: 01/29/1934, 79 y.o.   MRN: [id number]  Synopsis: Misty Ferguson was first seen by the [facility] pulmonary clinic in the summer of 2013. She has COPD. Simple spirometry performed in clinic showed an FEV1 of 45% predicted with clear obstruction. She smoked 1-1/3 pack of cigarettes daily through 2005. She is a frequent exacerbation phenotype. She uses 2 L of oxygen continuously with exertion.  HPI   08/25/2012 ROV -- This is been a very stable interval for Misty Ferguson. She notes that she is continuing to use the Advair and the Spiriva regularly. She continues to use her oxygen on a regular basis. She states that she has not had bronchitis this year like she typically has by this point and the year. She continues to take the azithromycin every day. She is working out either in her garage or at her local gym. She says that she can walk at least 2 miles daily.  12/23/12 ROV -- Misty Ferguson has been doing great since her last visit. She survived the winter without having a flare of COPD. She has not had any trouble hearing since being on the azithromycin. For the first time in her life she's noticed allergy symptoms this spring and that for the last several weeks she has had increased runny nose, itchy eyes, and scratchy throat. Her shortness of breath is doing fine. She is frustrated with the cost of Advair and Spiriva. She is asking about one of the newer combined long acting bronchodilator and inhaled corticosteroid agents. She continues to use oxygen with exertion.  03/24/13 ROV >  Misty Ferguson has been doing quite well since the last visit. She remains frustrated by the cost of her medicines. She has minimal cough. She continues to take the azithromycin daily. She's also using the Spiriva and Advair daily. She continues to exercise every day. She uses 2 L of oxygen with exertion on the treadmill. This week she actually walked a fair amount without oxygen and  only noticed that her oxygen saturation dropped to 85% for about 30 seconds at a time. She continues to sleep with the oxygen.   Past Medical History  Diagnosis Date  . Unspecified disorder resulting from impaired renal function   . Type II or unspecified type diabetes mellitus without mention of complication, not stated as uncontrolled   . Obstructive chronic bronchitis with exacerbation   . Tobacco use disorder   . Unspecified glaucoma(365.9)   . Macular degeneration (senile) of retina, unspecified   . Personal history of peptic ulcer disease   . Unspecified essential hypertension   . Chronic airway obstruction, not elsewhere classified   . Osteoporosis, unspecified     Review of Systems  Constitutional: Negative for fever, chills and fatigue.  HENT: Negative for hearing loss, congestion, rhinorrhea and postnasal drip.   Respiratory: Positive for shortness of breath. Negative for cough and wheezing.   Cardiovascular: Negative for chest pain, palpitations and leg swelling.       Objective:   Physical Exam   Filed Vitals:   03/24/13 1146  BP: 140/80  Pulse: 85  Temp: 97.7 F (36.5 C)  TempSrc: Oral  Height: 5\' 1"  (1.549 m)  Weight: 112 lb 12.8 oz (51.166 kg)  SpO2: 93%   room air  Gen: well appearing, no acute distress HEENT: NCAT, EOMi,  PULM: Poor air flow but clear to auscultation, insp wheezing in lower lobes bilaterally CV: RRR, no mgr, no JVD  AB: BS+, soft, nontender, no hsm Ext: warm, no edema, no clubbing, no cyanosis  03/24/2013 EKG normal sinus rhythm, interventricular conduction delay, QTC 447     Assessment & Plan:   COPD This has been a stable interval for Misty Ferguson as she continues to exercise and has not had another flare since starting the daily azithromycin.  In terms of drug monitoring, her recent LFT's were normal and her EKG today shows no change in her QTc (447).  Plan: -continue regular exercise -continue O2 QHS and with  exercise -continue spiriva, advair, azithromycin -f/u 6 months  -I have asked that she have LFT's with her next PCP visit/blood work in December 2014    Updated Medication List Outpatient Encounter Prescriptions as of 03/24/2013  Medication Sig Dispense Refill  . ADVAIR DISKUS 250-50 MCG/DOSE AEPB INHALE ONE DOSE BY MOUTH TWICE DAILY  60 each  0  . albuterol (ACCUNEB) 1.25 MG/3ML nebulizer solution Take 3 mLs (1.25 mg total) by nebulization every 6 (six) hours as needed.  75 mL  11  . aspirin 81 MG tablet Take 81 mg by mouth every other day.       Marland Kitchen azithromycin (ZITHROMAX) 250 MG tablet Once a day  90 tablet  1  . Calcium Carbonate-Vitamin D (CALCIUM 600/VITAMIN D) 600-400 MG-UNIT per tablet Take 1 tablet by mouth 2 (two) times daily.        Marland Kitchen losartan (COZAAR) 50 MG tablet TAKE ONE TABLET BY MOUTH ONCE DAILY  30 tablet  1  . Multiple Vitamins-Minerals (PRESERVISION AREDS 2 PO) Take by mouth 2 (two) times daily.      Marland Kitchen SPIRIVA HANDIHALER 18 MCG inhalation capsule INHALE CONTENTS OF ONE CAPSULE EVERY DAY  90 capsule  0  . [DISCONTINUED] Omega-3 Fatty Acids (GNP FISH OIL CONCENTRATE) 1000 MG CAPS Take 1 capsule by mouth daily.        . [DISCONTINUED] prednisoLONE acetate (PRED FORTE) 1 % ophthalmic suspension Place 1 drop into the right eye 2 (two) times daily.       No facility-administered encounter medications on file as of 03/24/2013.

## 2013-04-23 ENCOUNTER — Other Ambulatory Visit: Payer: Self-pay | Admitting: Family Medicine

## 2013-05-03 ENCOUNTER — Other Ambulatory Visit: Payer: Self-pay | Admitting: Family Medicine

## 2013-05-06 ENCOUNTER — Ambulatory Visit (INDEPENDENT_AMBULATORY_CARE_PROVIDER_SITE_OTHER): Payer: Medicare Other

## 2013-05-06 DIAGNOSIS — Z23 Encounter for immunization: Secondary | ICD-10-CM

## 2013-06-14 ENCOUNTER — Other Ambulatory Visit: Payer: Self-pay | Admitting: Family Medicine

## 2013-06-15 ENCOUNTER — Telehealth: Payer: Self-pay | Admitting: Emergency Medicine

## 2013-06-15 MED ORDER — AZITHROMYCIN 250 MG PO TABS: ORAL_TABLET | ORAL | Status: DC

## 2013-06-15 NOTE — Telephone Encounter (Signed)
I spoke with pt. I advised her will send in RX for her. Nothing further needed

## 2013-06-18 ENCOUNTER — Other Ambulatory Visit: Payer: Self-pay | Admitting: Pulmonary Disease

## 2013-06-18 MED ORDER — AZITHROMYCIN 250 MG PO TABS: ORAL_TABLET | ORAL | Status: DC

## 2013-06-30 ENCOUNTER — Other Ambulatory Visit: Payer: Self-pay | Admitting: *Deleted

## 2013-06-30 MED ORDER — AZITHROMYCIN 250 MG PO TABS: ORAL_TABLET | ORAL | Status: DC

## 2013-07-07 ENCOUNTER — Encounter: Payer: Self-pay | Admitting: Family Medicine

## 2013-07-07 ENCOUNTER — Ambulatory Visit (INDEPENDENT_AMBULATORY_CARE_PROVIDER_SITE_OTHER): Payer: Medicare Other | Admitting: Family Medicine

## 2013-07-07 VITALS — BP 148/78 | HR 96 | Temp 98.1°F | Wt 112.5 lb

## 2013-07-07 DIAGNOSIS — J441 Chronic obstructive pulmonary disease with (acute) exacerbation: Secondary | ICD-10-CM

## 2013-07-07 MED ORDER — PREDNISONE 20 MG PO TABS: ORAL_TABLET | ORAL | Status: DC

## 2013-07-07 MED ORDER — MOXIFLOXACIN HCL 400 MG PO TABS: 400.0000 mg | ORAL_TABLET | Freq: Every day | ORAL | Status: DC

## 2013-07-07 NOTE — Progress Notes (Signed)
Pre-visit discussion using our clinic review tool. No additional management support is needed unless otherwise documented below in the visit note.  H/o COPD on zmax suppression per pulmonary.  Had been feeling well until Sunday night.  Yesterday she was more fatigued, coughing more today.  Chest feels tight and heavy.  Has O2 for qhs use and prn during the day, ie with exercise.  She has needed O2 more than normal the last day or so.  No fevers known. More cough, more sputum recently.  Has some rhinorrhea.  Had a flu shot.  This course is typical for her with a flare of COPD.  H/o PNA noted.    Meds, vitals, and allergies reviewed.   ROS: See HPI.  Otherwise, noncontributory.  GEN: nad, alert and oriented HEENT: mucous membranes moist, tm w/o erythema, nasal exam w/o erythema, clear discharge noted,  OP with cobblestoning NECK: supple w/o LA CV: rrr.   PULM: ctab but global dec in BS.  No wheeze heard on exam EXT: no edema SKIN: no acute rash

## 2013-07-07 NOTE — Assessment & Plan Note (Signed)
She had a pulse ox at home of 85% yesterday.  Given her hx, would treat.  pred with GI caution, quinolone now and hold zmax, d/w pt.  She agrees.  Continue other meds, O2 prn.  To ER if worse.  She agrees.  Nontoxic now.  Okay for outpatient f/u.

## 2013-07-07 NOTE — Patient Instructions (Signed)
Stop the zithromax for now.  Change to avelox.  Take prednisone with food.  Restart the zithromax three days after you have finished the avelox.  Continue your inhalers and oxygen as you normally would.  If you get profoundly short of breath, then go to the ER.  Take care.

## 2013-07-10 ENCOUNTER — Telehealth: Payer: Self-pay | Admitting: Family Medicine

## 2013-07-10 NOTE — Telephone Encounter (Signed)
Message copied by Joaquim Nam on Fri Jul 10, 2013  9:44 AM ------      Message from: Annamarie Major      Created: Fri Jul 10, 2013  9:12 AM       Patient says she is not any worse but not a great deal better but she knows that it always takes time because of her COPD.      ----- Message -----         From: Joaquim Nam, MD         Sent: 07/10/2013   8:57 AM           To: Annamarie Major, CMA            Please call pt and get an update.  If she is not worse, then I would likely continue as is and give this a little more time.  If clearly worse, then we need to know about it.  Thanks.              Clelia Croft             ------

## 2013-07-10 NOTE — Telephone Encounter (Signed)
Noted, thanks!

## 2013-07-16 MED ORDER — LEVOFLOXACIN 750 MG PO TABS: 750.0000 mg | ORAL_TABLET | Freq: Every day | ORAL | Status: DC

## 2013-07-16 NOTE — Telephone Encounter (Addendum)
Pt left v/m pt was seen 07/07/13; pt has COPD for 17 years and is no better than when seen; pt thinks she needs stronger med. Pt wants note sent to Dr Ermalene Searing. Presently pt S/T, runny nose, chest congestion, prod cough with white phlegm, has wheezing and SOB upon exertion. No fever.[facility] [address] Pt request cb.

## 2013-07-16 NOTE — Telephone Encounter (Signed)
I t appears that she has responded better to levaquin in the past. I will send ain a 10 day course of this. Stop avelox if not already completed.

## 2013-07-16 NOTE — Addendum Note (Signed)
Addended by: Kerby Nora E on: 07/16/2013 01:27 PM   Modules accepted: Orders, Medications

## 2013-07-16 NOTE — Telephone Encounter (Signed)
Misty Ferguson notified that  Levaquin has been sent to her pharmacy.  She is to take one tablet daily for 7 days per Dr Ermalene Searing.  To call our office if no improvement after this round of antibiotics.  Patient is very Adult nurse.

## 2013-08-10 ENCOUNTER — Ambulatory Visit (INDEPENDENT_AMBULATORY_CARE_PROVIDER_SITE_OTHER): Payer: Medicare Other | Admitting: Family Medicine

## 2013-08-10 ENCOUNTER — Encounter: Payer: Self-pay | Admitting: Family Medicine

## 2013-08-10 VITALS — BP 150/90 | HR 106 | Temp 97.9°F | Ht 61.0 in | Wt 113.5 lb

## 2013-08-10 DIAGNOSIS — J441 Chronic obstructive pulmonary disease with (acute) exacerbation: Secondary | ICD-10-CM

## 2013-08-10 MED ORDER — ALBUTEROL SULFATE 1.25 MG/3ML IN NEBU: 1.0000 | INHALATION_SOLUTION | Freq: Four times a day (QID) | RESPIRATORY_TRACT | Status: DC | PRN

## 2013-08-10 MED ORDER — LEVOFLOXACIN 750 MG PO TABS: 750.0000 mg | ORAL_TABLET | Freq: Every day | ORAL | Status: DC

## 2013-08-10 NOTE — Progress Notes (Signed)
Pre-visit discussion using our clinic review tool. No additional management support is needed unless otherwise documented below in the visit note.  

## 2013-08-10 NOTE — Patient Instructions (Addendum)
Hold azithromycin while on levaquin. Complete course of levaquin. Start back albuterol nebs as needed.  Rest, fluids. Call if shortness of breath not improving in next 48-72 hours.  If severe SOB go to ER.

## 2013-08-10 NOTE — Addendum Note (Signed)
Addended by: Damita Lack on: 08/10/2013 01:09 PM   Modules accepted: Orders

## 2013-08-10 NOTE — Addendum Note (Signed)
Addended by: Damita Lack on: 08/10/2013 02:03 PM   Modules accepted: Orders

## 2013-08-10 NOTE — Assessment & Plan Note (Signed)
Change in sputum.. Treat with antibiotics ( pt states only levaquin works for her) Use albuterol, no clear indication for prednisone at this point.. She will call if not turning the corner.

## 2013-08-10 NOTE — Progress Notes (Signed)
   Subjective:    Patient ID: Misty Ferguson, female    DOB: 12-31-1933, 79 y.o.   MRN: [id number]  HPI  79 year old female followed by pulmonary for COPD, mod severe on nighttime oxygen and chronic azithromycin suppression presents with  cough   Seen on 11/28 by Dr. Para March for COPD exacerbation... Treated with avelox. She did not improve. We then changed her to Levaquin.  She had almost complete resolution of symptoms for 1 week.  Chest tightness, myalgia, sore throat in last 4-5 days. Increase in mucus and cough in last 2-3 days. She has had a light yellow change in mucus production. She has been using mucinex which has helped slightly. Has not been using nebulizers (forgot about it) Continues on advair and spiriva. No fever.   She has noted increased SOB, she has needed to be on oxygen during the day as well.       Review of Systems  Constitutional: Negative for fever and fatigue.  HENT: Negative for ear pain.   Eyes: Negative for pain.  Respiratory: Negative for chest tightness and shortness of breath.   Cardiovascular: Negative for chest pain, palpitations and leg swelling.  Gastrointestinal: Negative for abdominal pain.  Genitourinary: Negative for dysuria.       Objective:   Physical Exam  Constitutional: Vital signs are normal. She appears well-developed and well-nourished. She is cooperative.  Non-toxic appearance. She does not appear ill. No distress.  HENT:  Head: Normocephalic.  Right Ear: Hearing, tympanic membrane, external ear and ear canal normal. Tympanic membrane is not erythematous, not retracted and not bulging.  Left Ear: Hearing, tympanic membrane, external ear and ear canal normal. Tympanic membrane is not erythematous, not retracted and not bulging.  Nose: Mucosal edema and rhinorrhea present. Right sinus exhibits no maxillary sinus tenderness and no frontal sinus tenderness. Left sinus exhibits no maxillary sinus tenderness and no frontal sinus  tenderness.  Mouth/Throat: Uvula is midline, oropharynx is clear and moist and mucous membranes are normal.  Eyes: Conjunctivae, EOM and lids are normal. Pupils are equal, round, and reactive to light. Lids are everted and swept, no foreign bodies found.  Neck: Trachea normal and normal range of motion. Neck supple. Carotid bruit is not present. No mass and no thyromegaly present.  Cardiovascular: Normal rate, regular rhythm, S1 normal, S2 normal, normal heart sounds, intact distal pulses and normal pulses.  Exam reveals no gallop and no friction rub.   No murmur heard. Pulmonary/Chest: Effort normal. Not tachypneic. No respiratory distress. She has decreased breath sounds. She has no wheezes. She has no rhonchi. She has no rales.  Minimal wheeze  Neurological: She is alert.  Skin: Skin is warm, dry and intact. No rash noted.  Psychiatric: Her speech is normal and behavior is normal. Judgment normal. Her mood appears not anxious. Cognition and memory are normal. She does not exhibit a depressed mood.          Assessment & Plan:

## 2013-09-08 ENCOUNTER — Telehealth: Payer: Self-pay | Admitting: Family Medicine

## 2013-09-08 ENCOUNTER — Other Ambulatory Visit (INDEPENDENT_AMBULATORY_CARE_PROVIDER_SITE_OTHER): Payer: Medicare Other

## 2013-09-08 DIAGNOSIS — E119 Type 2 diabetes mellitus without complications: Secondary | ICD-10-CM

## 2013-09-08 DIAGNOSIS — E7800 Pure hypercholesterolemia, unspecified: Secondary | ICD-10-CM

## 2013-09-08 DIAGNOSIS — I10 Essential (primary) hypertension: Secondary | ICD-10-CM

## 2013-09-08 DIAGNOSIS — M810 Age-related osteoporosis without current pathological fracture: Secondary | ICD-10-CM

## 2013-09-08 LAB — COMPREHENSIVE METABOLIC PANEL
ALT: 32 U/L (ref 0–35)
AST: 30 U/L (ref 0–37)
Albumin: 4.2 g/dL (ref 3.5–5.2)
Alkaline Phosphatase: 69 U/L (ref 39–117)
BUN: 20 mg/dL (ref 6–23)
CALCIUM: 9.8 mg/dL (ref 8.4–10.5)
CHLORIDE: 105 meq/L (ref 96–112)
CO2: 27 meq/L (ref 19–32)
Creatinine, Ser: 1 mg/dL (ref 0.4–1.2)
GFR: 56.45 mL/min — ABNORMAL LOW (ref >60.00)
GLUCOSE: 102 mg/dL — AB (ref 70–99)
POTASSIUM: 4.4 meq/L (ref 3.5–5.1)
Sodium: 141 mEq/L (ref 135–145)
Total Bilirubin: 0.8 mg/dL (ref 0.3–1.2)
Total Protein: 7.1 g/dL (ref 6.0–8.3)

## 2013-09-08 LAB — LIPID PANEL
CHOLESTEROL: 261 mg/dL — AB (ref 0–200)
HDL: 129.3 mg/dL (ref >39.00)
TRIGLYCERIDES: 56 mg/dL (ref 0.0–149.0)
Total CHOL/HDL Ratio: 2
VLDL: 11.2 mg/dL (ref 0.0–40.0)

## 2013-09-08 LAB — LDL CHOLESTEROL, DIRECT: Direct LDL: 99.1 mg/dL

## 2013-09-08 LAB — HEMOGLOBIN A1C: Hgb A1c MFr Bld: 5.9 % (ref 4.6–6.5)

## 2013-09-08 NOTE — Telephone Encounter (Signed)
Message copied by [name] on Tue Sep 08, 2013  7:39 AM ------      Message from: Ellamae Sia      Created: Mon Aug 31, 2013 12:37 PM      Regarding: Lab orders for Tuesday, 1.27.15       Patient is scheduled for CPX labs, please order future labs, Thanks , Terri       ------

## 2013-09-09 ENCOUNTER — Telehealth: Payer: Self-pay | Admitting: Family Medicine

## 2013-09-09 LAB — VITAMIN D 25 HYDROXY (VIT D DEFICIENCY, FRACTURES): Vit D, 25-Hydroxy: 27 ng/mL — ABNORMAL LOW (ref 30–89)

## 2013-09-09 NOTE — Telephone Encounter (Signed)
Relevant patient education assigned to patient using Emmi. ° °

## 2013-09-10 ENCOUNTER — Telehealth: Payer: Self-pay

## 2013-09-10 NOTE — Telephone Encounter (Signed)
Relevant patient education assigned to patient using Emmi. ° °

## 2013-09-15 ENCOUNTER — Encounter: Payer: Medicare Other | Admitting: Family Medicine

## 2013-09-15 ENCOUNTER — Ambulatory Visit (INDEPENDENT_AMBULATORY_CARE_PROVIDER_SITE_OTHER): Payer: Medicare Other | Admitting: Pulmonary Disease

## 2013-09-15 ENCOUNTER — Encounter: Payer: Self-pay | Admitting: Pulmonary Disease

## 2013-09-15 VITALS — BP 114/70 | HR 77 | Temp 97.3°F | Ht 61.0 in | Wt 112.0 lb

## 2013-09-15 DIAGNOSIS — Z5181 Encounter for therapeutic drug level monitoring: Secondary | ICD-10-CM

## 2013-09-15 DIAGNOSIS — J4489 Other specified chronic obstructive pulmonary disease: Secondary | ICD-10-CM

## 2013-09-15 DIAGNOSIS — R0902 Hypoxemia: Secondary | ICD-10-CM

## 2013-09-15 DIAGNOSIS — J449 Chronic obstructive pulmonary disease, unspecified: Secondary | ICD-10-CM

## 2013-09-15 NOTE — Patient Instructions (Signed)
Keep taking your medicines as you are doing Stay active Use your oxygen regularly We will see you back in 3 months or sooner if needed

## 2013-09-15 NOTE — Assessment & Plan Note (Signed)
This has been a rough go of it for [facility] since she had two bad flares in the last few months.  I explained to her that it will take a while to recover from these and she may never regain the degree of lung function she enjoyed before the flares.    However, she is resilient and I think she will do well.  At this point her therapy is maximized with daily azithro, spiriva, and Advair.  We discussed switching to roflumilast but she is not interested in that and I'm not convinced it would improve things.  I explained to her today that the NAC she is using is not FDA approved for COPD so I can't recommend that she continue this.  Plan: -EKG today to monitor QTc on azithro -continue spiriva, advair, azithro daily -continue O2 as written -continue regular exercise

## 2013-09-15 NOTE — Assessment & Plan Note (Signed)
Continue 2 L O2 via [place] continuously

## 2013-09-15 NOTE — Progress Notes (Signed)
Subjective:    Patient ID: Abriana Saltos, female    DOB: 12-11-1933, 80 y.o.   MRN: [id number]  Synopsis: Kaylee Trivett was first seen by the [facility] pulmonary clinic in the summer of 2013. She has COPD. Simple spirometry performed in clinic showed an FEV1 of 45% predicted with clear obstruction. She smoked 1-1/3 pack of cigarettes daily through 2005. She is a frequent exacerbation phenotype. She uses 2 L of oxygen continuously with exertion.  HPI   08/25/2012 ROV -- This is been a very stable interval for Oneal. She notes that she is continuing to use the Advair and the Spiriva regularly. She continues to use her oxygen on a regular basis. She states that she has not had bronchitis this year like she typically has by this point and the year. She continues to take the azithromycin every day. She is working out either in her garage or at her local gym. She says that she can walk at least 2 miles daily.  12/23/12 ROV -- Caral has been doing great since her last visit. She survived the winter without having a flare of COPD. She has not had any trouble hearing since being on the azithromycin. For the first time in her life she's noticed allergy symptoms this spring and that for the last several weeks she has had increased runny nose, itchy eyes, and scratchy throat. Her shortness of breath is doing fine. She is frustrated with the cost of Advair and Spiriva. She is asking about one of the newer combined long acting bronchodilator and inhaled corticosteroid agents. She continues to use oxygen with exertion.  03/24/13 ROV >  Ailish has been doing quite well since the last visit. She remains frustrated by the cost of her medicines. She has minimal cough. She continues to take the azithromycin daily. She's also using the Spiriva and Advair daily. She continues to exercise every day. She uses 2 L of oxygen with exertion on the treadmill. This week she actually walked a fair amount without oxygen and  only noticed that her oxygen saturation dropped to 85% for about 30 seconds at a time. She continues to sleep with the oxygen.  09/15/2013 ROV >> Doll has been doing OK since the last visit but she has had two episodes of a nasty viral illness a couple of times since the last visit.  She has been on O2 continuously since then because she feels like she lost some lung function since then.  She really struggled with shortness of breath around the time of the procedure.  The first episode was around Thanksgiving and then another a month later.  She continues to take the azithromycin daily as well as the Spiriva and Advair.    Past Medical History  Diagnosis Date  . Unspecified disorder resulting from impaired renal function   . Type II or unspecified type diabetes mellitus without mention of complication, not stated as uncontrolled   . Obstructive chronic bronchitis with exacerbation   . Tobacco use disorder   . Unspecified glaucoma   . Macular degeneration (senile) of retina, unspecified   . Personal history of peptic ulcer disease   . Unspecified essential hypertension   . Chronic airway obstruction, not elsewhere classified   . Osteoporosis, unspecified     Review of Systems  Constitutional: Negative for fever, chills and fatigue.  HENT: Negative for congestion, hearing loss, postnasal drip and rhinorrhea.   Respiratory: Positive for shortness of breath. Negative for cough and wheezing.  Cardiovascular: Negative for chest pain, palpitations and leg swelling.       Objective:   Physical Exam   Filed Vitals:   09/15/13 0902  BP: 114/70  Pulse: 77  Temp: 97.3 F (36.3 C)  TempSrc: Oral  Height: 5\' 1"  (1.549 m)  Weight: 112 lb (50.803 kg)  SpO2: 100%   room air  Gen: well appearing, no acute distress HEENT: NCAT, EOMi,  PULM: Poor air flow but clear to auscultation, insp wheezing in lower lobes bilaterally CV: RRR, no mgr, no JVD AB: BS+, soft, nontender, no hsm Ext: warm,  no edema, no clubbing, no cyanosis  03/24/2013 EKG normal sinus rhythm, interventricular conduction delay, QTC 447     Assessment & Plan:   COPD This has been a rough go of it for Clydia since she had two bad flares in the last few months.  I explained to her that it will take a while to recover from these and she may never regain the degree of lung function she enjoyed before the flares.    However, she is resilient and I think she will do well.  At this point her therapy is maximized with daily azithro, spiriva, and Advair.  We discussed switching to roflumilast but she is not interested in that and I'm not convinced it would improve things.  I explained to her today that the NAC she is using is not FDA approved for COPD so I can't recommend that she continue this.  Plan: -EKG today to monitor QTc on azithro -continue spiriva, advair, azithro daily -continue O2 as written -continue regular exercise  Hypoxia Continue 2 L O2 via [place] continuously    Updated Medication List Outpatient Encounter Prescriptions as of 09/15/2013  Medication Sig  . Acetylcysteine (NAC 600 PO) Take 600 mg by mouth daily.  Marland Kitchen ADVAIR DISKUS 250-50 MCG/DOSE AEPB INHALE ONE DOSE BY MOUTH TWICE DAILY  . albuterol (ACCUNEB) 1.25 MG/3ML nebulizer solution Take 3 mLs (1.25 mg total) by nebulization every 6 (six) hours as needed.  Marland Kitchen aspirin 81 MG tablet Take 81 mg by mouth every other day.   Marland Kitchen azithromycin (ZITHROMAX) 250 MG tablet Take 250 mg by mouth daily.  . Calcium Carbonate-Vitamin D (CALCIUM 600/VITAMIN D) 600-400 MG-UNIT per tablet Take 1 tablet by mouth 2 (two) times daily.    Marland Kitchen losartan (COZAAR) 50 MG tablet TAKE ONE TABLET BY MOUTH ONCE DAILY  . SPIRIVA HANDIHALER 18 MCG inhalation capsule INHALE ONE DOSE USING HANDIHALER ONCE DAILY  . [DISCONTINUED] levofloxacin (LEVAQUIN) 750 MG tablet Take 1 tablet (750 mg total) by mouth daily.

## 2013-09-18 ENCOUNTER — Ambulatory Visit (INDEPENDENT_AMBULATORY_CARE_PROVIDER_SITE_OTHER): Payer: Medicare Other | Admitting: Family Medicine

## 2013-09-18 ENCOUNTER — Encounter: Payer: Self-pay | Admitting: Family Medicine

## 2013-09-18 VITALS — BP 110/80 | HR 73 | Temp 97.4°F | Ht 61.25 in | Wt 111.8 lb

## 2013-09-18 DIAGNOSIS — E119 Type 2 diabetes mellitus without complications: Secondary | ICD-10-CM

## 2013-09-18 DIAGNOSIS — Z0000 Encounter for general adult medical examination without abnormal findings: Secondary | ICD-10-CM

## 2013-09-18 DIAGNOSIS — E7800 Pure hypercholesterolemia, unspecified: Secondary | ICD-10-CM

## 2013-09-18 DIAGNOSIS — I10 Essential (primary) hypertension: Secondary | ICD-10-CM

## 2013-09-18 DIAGNOSIS — J449 Chronic obstructive pulmonary disease, unspecified: Secondary | ICD-10-CM

## 2013-09-18 MED ORDER — VITAMIN D (ERGOCALCIFEROL) 1.25 MG (50000 UNIT) PO CAPS: 50000.0000 [IU] | ORAL_CAPSULE | ORAL | Status: DC

## 2013-09-18 NOTE — Patient Instructions (Addendum)
Start vit D supplement weekly for 12 weeks then continue regular ca and vit D.  kee[p working on healthy eating and work on increasing exercise as able.

## 2013-09-18 NOTE — Assessment & Plan Note (Signed)
Well controlleld on no med. 

## 2013-09-18 NOTE — Progress Notes (Signed)
I have personally reviewed the Medicare Annual Wellness questionnaire and have noted  1. The patient's medical and social history  2. Their use of alcohol, tobacco or illicit drugs  3. Their current medications and supplements  4. The patient's functional ability including ADL's, fall risks, home safety risks and hearing or visual  impairment.  5. Diet and physical activities  6. Evidence for depression or mood disorders  The patients weight, height, BMI and visual acuity have been recorded in the chart  I have made referrals, counseling and provided education to the patient based review of the above and I have provided the pt with a written personalized care plan for preventive services.    Reviewed pulmonary MD note. She is on continuous oxygen now for severe COPD. Several exacerbations in last year. On chronic azithromycin.  Diabetes:  Well controlled. Lab Results  Component Value Date   HGBA1C 5.9 09/08/2013  Using medications without difficulties: On no meds.  Hypoglycemic episodes: ?  Hyperglycemic episodes:?  Feet problems:None  Blood Sugars averaging:not checking.  eye exam within last year: yes   Hypertension: At goal on losartan  BP Readings from Last 3 Encounters:  09/18/13 110/80  09/15/13 114/70  08/10/13 150/90  Using medication without problems or lightheadedness: None Occ HR elevations  At home noted on pulse ox 123-134. Usually SOB at that time. Chest pain with exertion:None  Edema:None  Short of breath: stable  Average home BPs: not checking  Other issues:   Hyperpigmentation on B anterior calf and forearms. Has been present for 3 years.  Elevated Cholesterol: Lab Results  Component Value Date   CHOL 261* 09/08/2013   HDL 129.30 09/08/2013   LDLCALC  Value: 43        Total Cholesterol/HDL:CHD Risk Coronary Heart Disease Risk Table                     Men   Women  1/2 Average Risk   3.4   3.3  Average Risk       5.0   4.4  2 X Average Risk   9.6   7.1  3 X  Average Risk  23.4   11.0        Use the calculated Patient Ratio above and the CHD Risk Table to determine the patient's CHD Risk.        ATP III CLASSIFICATION (LDL):  <100     mg/dL   Optimal  100-129  mg/dL   Near or Above                    Optimal  130-159  mg/dL   Borderline  160-189  mg/dL   High  >190     mg/dL   Very High 08/19/2010   LDLDIRECT 99.1 09/08/2013   TRIG 56.0 09/08/2013   CHOLHDL 2 09/08/2013  Using medications without problems: On no rx medication.  Diet compliance: Good  Exercise:Good, On treadmill few times a week. .  Review of Systems  Constitutional: Negative for fever, fatigue and unexpected weight change.  HENT: Negative for ear pain, congestion, sore throat, sneezing, trouble swallowing and sinus pressure.  Eyes: Negative for pain and itching.  Respiratory: Stable  cough and shortness of breath.  Cardiovascular: Negative for chest pain, palpitations and leg swelling.  Gastrointestinal: Negative for nausea, abdominal pain, diarrhea, constipation and blood in stool.  Genitourinary: Negative for dysuria, hematuria, vaginal discharge, difficulty urinating and menstrual problem.  Skin: Negative for  rash.  Neurological: Negative for syncope, weakness, light-headedness, numbness and headaches.  Psychiatric/Behavioral: Negative for confusion and dysphoric mood. The patient is not nervous/anxious.  Objective:   Physical Exam  Constitutional: Vital signs are normal. She appears well-developed and well-nourished. She is cooperative. Non-toxic appearance. She does not appear ill. No distress.  HENT:  Head: Normocephalic.  Right Ear: Hearing, tympanic membrane, external ear and ear canal normal.  Left Ear: Hearing, tympanic membrane, external ear and ear canal normal.  Nose: Nose normal.  Eyes: Conjunctivae, EOM and lids are normal. Pupils are equal, round, and reactive to light. No foreign bodies found.  Neck: Trachea normal and normal range of motion. Neck supple. Carotid  bruit is not present. No mass and no thyromegaly present.  Cardiovascular: Normal rate, regular rhythm, S1 normal, S2 normal, normal heart sounds and intact distal pulses. Exam reveals no gallop.  No murmur heard.  Pulmonary/Chest: Effort normal. No respiratory distress. She has decreased breath sounds in the right upper field, the right middle field, the right lower field, the left upper field, the left middle field and the left lower field. She has no wheezes. She has no rhonchi. She has no rales.  Stable exam  Abdominal: Soft. Normal appearance and bowel sounds are normal. She exhibits no distension, no fluid wave, no abdominal bruit and no mass. There is no hepatosplenomegaly. There is no tenderness. There is no rebound, no guarding and no CVA tenderness. No hernia.  Genitourinary: Nml breast exam no masses, no axillary lymphadenopathy  PAP/ DVE not indicated  Diffuse atrophy in left quad laterally, tenderness To palpation. Lymphadenopathy:  She has no cervical adenopathy.  She has no axillary adenopathy.  Neurological: She is alert. She has normal strength. No cranial nerve deficit or sensory deficit.  Skin: Skin is warm, dry and intact. No rash noted.  Hyperpigmentation of B anterior callve, nml pulses Bilaterall, hair growth on legs. Psychiatric: Her speech is normal and behavior is normal. Judgment normal. Her mood appears not anxious. Cognition and memory are normal. She does not exhibit a depressed mood.  Assessment & Plan:   Annual Medicare Wellness: The patient's preventative maintenance and recommended screening tests for an annual wellness exam were reviewed in full today.  Brought up to date unless services declined.  Counselled on the importance of diet, exercise, and its role in overall health and mortality.  The patient's FH and SH was reviewed, including their home life, tobacco status, and drug and alcohol status.   Vaccines:Has gotten flu, Uptodate with Td, shingles,PNA   Mammogram: last in 2009, not interested in continuing to follow.  DEXA: on fosamax (many years about 10 years) last checked 2009... Stopped fosamax ,   She is not interested in further DEXA.  Low vit D: repleted. Colon: Not interested in colon cancer screening. Discussed the risks and benefits of cancer screening... Pt voices understanding an chooses to not proceed with prevention as noted above.  No indication for pap/DVE.

## 2013-09-18 NOTE — Assessment & Plan Note (Signed)
Well controlled. Continue current medication.  

## 2013-09-18 NOTE — Assessment & Plan Note (Signed)
Well controlled on no med. 

## 2013-09-18 NOTE — Progress Notes (Signed)
Pre-visit discussion using our clinic review tool. No additional management support is needed unless otherwise documented below in the visit note.  

## 2013-10-23 ENCOUNTER — Other Ambulatory Visit: Payer: Self-pay | Admitting: Family Medicine

## 2013-11-07 ENCOUNTER — Other Ambulatory Visit: Payer: Self-pay | Admitting: Family Medicine

## 2014-01-13 ENCOUNTER — Telehealth: Payer: Self-pay | Admitting: Family Medicine

## 2014-01-13 NOTE — Telephone Encounter (Signed)
Pt had dentist appt today and has white lesion on side on tongue that dentist wants her to get checked out. She wants to know if this is something you could look at and take care of or would it be best for her to see and oral surgeon? She would rather come see you bc she does not have dental insurance. Please advise!

## 2014-01-14 NOTE — Telephone Encounter (Signed)
Mrs. Vigorito notified as instructed by telephone.  She would prefer to start with Dr. [name] taking a look at her tongue.  Appointment scheduled for 01/26/2014 @ 9:30am.

## 2014-01-14 NOTE — Telephone Encounter (Signed)
I can take a look at it, but I cannot biopsy this area.  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

## 2014-01-14 NOTE — Telephone Encounter (Signed)
Left message for patient to return my call.

## 2014-01-19 LAB — HM DIABETES EYE EXAM

## 2014-01-26 ENCOUNTER — Ambulatory Visit: Payer: Medicare Other | Admitting: Family Medicine

## 2014-02-22 ENCOUNTER — Other Ambulatory Visit: Payer: Self-pay | Admitting: Family Medicine

## 2014-04-20 ENCOUNTER — Other Ambulatory Visit: Payer: Self-pay | Admitting: Family Medicine

## 2014-04-29 ENCOUNTER — Other Ambulatory Visit: Payer: Self-pay | Admitting: Family Medicine

## 2014-05-06 ENCOUNTER — Ambulatory Visit (INDEPENDENT_AMBULATORY_CARE_PROVIDER_SITE_OTHER): Payer: Medicare Other

## 2014-05-06 DIAGNOSIS — Z23 Encounter for immunization: Secondary | ICD-10-CM

## 2014-07-22 ENCOUNTER — Telehealth: Payer: Self-pay | Admitting: Family Medicine

## 2014-07-22 ENCOUNTER — Telehealth: Payer: Self-pay | Admitting: Pulmonary Disease

## 2014-07-22 MED ORDER — LOSARTAN POTASSIUM 50 MG PO TABS: 50.0000 mg | ORAL_TABLET | Freq: Every day | ORAL | Status: DC

## 2014-07-22 MED ORDER — FLUTICASONE-SALMETEROL 250-50 MCG/DOSE IN AEPB: 1.0000 | INHALATION_SPRAY | Freq: Two times a day (BID) | RESPIRATORY_TRACT | Status: DC

## 2014-07-22 MED ORDER — TIOTROPIUM BROMIDE MONOHYDRATE 18 MCG IN CAPS: ORAL_CAPSULE | RESPIRATORY_TRACT | Status: DC

## 2014-07-22 NOTE — Addendum Note (Signed)
Addended by: Carter Kitten on: 07/22/2014 06:01 PM   Modules accepted: Orders

## 2014-07-22 NOTE — Telephone Encounter (Signed)
Noted  

## 2014-07-22 NOTE — Telephone Encounter (Signed)
Pt called stating [facility] will be faxing a form for dr [name] to sign so they can get there rx from mail order

## 2014-07-22 NOTE — Telephone Encounter (Signed)
[id number] x1 

## 2014-07-23 NOTE — Telephone Encounter (Signed)
I spoke with the pt and she states they are switching to [facility] and they have faxed over a form that needs to be signed by Dr. Lake Bells so they can get their medications. Is this form in BQ look-at? Ziebach Bing, CMA

## 2014-07-27 NOTE — Telephone Encounter (Signed)
Caryl Pina, have you received this form? Thanks.

## 2014-07-27 NOTE — Telephone Encounter (Signed)
I have not received any forms on this patient.  

## 2014-07-28 MED ORDER — AZITHROMYCIN 250 MG PO TABS: 250.0000 mg | ORAL_TABLET | Freq: Every day | ORAL | Status: DC

## 2014-07-28 NOTE — Telephone Encounter (Signed)
I spoke with the pt and advised we have not received any forms. I asked if she has a # for [facility] that she has been speaking with. She gave me [phone]. I spoke with the pharmacy and was advised that the pt is requesting a refill on azithromycin. Per OV notes she takes this daily and is prescribed by Dr. Lake Bells. All [facility] needs is an rx for this to place on file to hold until Jan when her plan takes effect. Refill sent. Pt is aware. Slaton Bing, CMA

## 2014-09-14 ENCOUNTER — Encounter: Payer: Self-pay | Admitting: Family Medicine

## 2014-09-14 ENCOUNTER — Other Ambulatory Visit (INDEPENDENT_AMBULATORY_CARE_PROVIDER_SITE_OTHER): Payer: Commercial Managed Care - HMO

## 2014-09-14 ENCOUNTER — Telehealth: Payer: Self-pay | Admitting: Family Medicine

## 2014-09-14 DIAGNOSIS — E119 Type 2 diabetes mellitus without complications: Secondary | ICD-10-CM | POA: Diagnosis not present

## 2014-09-14 DIAGNOSIS — E7800 Pure hypercholesterolemia, unspecified: Secondary | ICD-10-CM

## 2014-09-14 DIAGNOSIS — M810 Age-related osteoporosis without current pathological fracture: Secondary | ICD-10-CM

## 2014-09-14 DIAGNOSIS — R809 Proteinuria, unspecified: Secondary | ICD-10-CM

## 2014-09-14 LAB — LIPID PANEL
Cholesterol: 229 mg/dL — ABNORMAL HIGH (ref 0–200)
HDL: 121.1 mg/dL (ref >39.00)
LDL CALC: 93 mg/dL (ref 0–99)
NonHDL: 107.9
Total CHOL/HDL Ratio: 2
Triglycerides: 73 mg/dL (ref 0.0–149.0)
VLDL: 14.6 mg/dL (ref 0.0–40.0)

## 2014-09-14 LAB — MICROALBUMIN / CREATININE URINE RATIO
CREATININE, U: 26.3 mg/dL
Microalb Creat Ratio: 2.7 mg/g (ref 0.0–30.0)
Microalb, Ur: <0.7 mg/dL (ref 0.0–1.9)

## 2014-09-14 LAB — COMPREHENSIVE METABOLIC PANEL
ALT: 25 U/L (ref 0–35)
AST: 25 U/L (ref 0–37)
Albumin: 4.6 g/dL (ref 3.5–5.2)
Alkaline Phosphatase: 38 U/L — ABNORMAL LOW (ref 39–117)
BILIRUBIN TOTAL: 0.5 mg/dL (ref 0.2–1.2)
BUN: 26 mg/dL — AB (ref 6–23)
CO2: 29 meq/L (ref 19–32)
Calcium: 10.1 mg/dL (ref 8.4–10.5)
Chloride: 105 mEq/L (ref 96–112)
Creatinine, Ser: 0.99 mg/dL (ref 0.40–1.20)
GFR: 57.61 mL/min — AB (ref >60.00)
Glucose, Bld: 105 mg/dL — ABNORMAL HIGH (ref 70–99)
Potassium: 4.8 mEq/L (ref 3.5–5.1)
Sodium: 140 mEq/L (ref 135–145)
Total Protein: 6.6 g/dL (ref 6.0–8.3)

## 2014-09-14 LAB — HEMOGLOBIN A1C: Hgb A1c MFr Bld: 6 % (ref 4.6–6.5)

## 2014-09-14 LAB — VITAMIN D 25 HYDROXY (VIT D DEFICIENCY, FRACTURES): VITD: 25.8 ng/mL — ABNORMAL LOW (ref 30.00–100.00)

## 2014-09-14 NOTE — Telephone Encounter (Signed)
-----   Message from Ellamae Sia sent at 09/09/2014  2:30 PM EST ----- Regarding: Lab orders for Tuesday, 2.2.16 Patient is scheduled for CPX labs, please order future labs, Thanks , Karna Christmas

## 2014-09-14 NOTE — Addendum Note (Signed)
Addended by: Ellamae Sia on: 09/14/2014 09:08 AM   Modules accepted: Orders

## 2014-09-21 ENCOUNTER — Ambulatory Visit (INDEPENDENT_AMBULATORY_CARE_PROVIDER_SITE_OTHER): Payer: Commercial Managed Care - HMO | Admitting: Family Medicine

## 2014-09-21 ENCOUNTER — Encounter: Payer: Self-pay | Admitting: Family Medicine

## 2014-09-21 VITALS — BP 118/76 | HR 85 | Temp 97.3°F | Ht 61.0 in | Wt 110.5 lb

## 2014-09-21 DIAGNOSIS — Z23 Encounter for immunization: Secondary | ICD-10-CM | POA: Diagnosis not present

## 2014-09-21 DIAGNOSIS — Z7189 Other specified counseling: Secondary | ICD-10-CM | POA: Insufficient documentation

## 2014-09-21 DIAGNOSIS — Z0000 Encounter for general adult medical examination without abnormal findings: Secondary | ICD-10-CM

## 2014-09-21 LAB — HM DIABETES FOOT EXAM
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
## 2014-09-21 NOTE — Progress Notes (Signed)
Pre visit review using our clinic review tool, if applicable. No additional management support is needed unless otherwise documented below in the visit note. 

## 2014-09-21 NOTE — Patient Instructions (Signed)
Vit D 3 400 IU twice daily. Keep up great work on healthy eating.

## 2014-11-30 NOTE — Op Note (Signed)
PATIENT NAME:  Misty Ferguson, Misty Ferguson MR#:  [id number] DATE OF BIRTH:  Dec 23, 1933  DATE OF PROCEDURE:  08/01/2012  LOCATION:  [place]  PREOPERATIVE DIAGNOSIS: Visually significant cataract of the right eye.   POSTOPERATIVE DIAGNOSIS: Visually significant cataract of the right eye.   OPERATIVE PROCEDURE: Cataract extraction by phacoemulsification with implant of intraocular lens to right eye.   SURGEON: Birder Robson, MD.   ANESTHESIA:  1. Managed anesthesia care.  2. Topical tetracaine drops followed by 2% Xylocaine jelly applied in the preoperative holding area.   COMPLICATIONS: None.   TECHNIQUE: Stop and chop.   DESCRIPTION OF PROCEDURE: The patient was examined and consented in the preoperative holding area where the aforementioned topical anesthesia was applied to the right eye and then brought back to the Operating Room where the right eye was prepped and draped in the usual sterile ophthalmic fashion and a lid speculum was placed. A paracentesis was created with the side port blade and the anterior chamber was filled with viscoelastic. A near clear corneal incision was performed with the steel keratome. A continuous curvilinear capsulorrhexis was performed with a cystotome followed by the capsulorrhexis forceps. Hydrodissection and hydrodelineation were carried out with BSS on a blunt cannula. The lens was removed in a stop and chop technique and the remaining cortical material was removed with the irrigation-aspiration handpiece. The capsular bag was inflated with viscoelastic and the Tecnis ZCB00 22.5-diopter lens, serial number [id number] was placed in the capsular bag without complication. The remaining viscoelastic was removed from the eye with the irrigation-aspiration handpiece. The wounds were hydrated. The anterior chamber was flushed with Miostat and the eye was inflated to physiologic pressure. The wounds were found to be water tight. The eye was dressed with Vigamox  and Combigan. The patient was given protective glasses to wear throughout the day and a shield with which to sleep tonight. The patient was also given drops with which to begin a drop regimen today and will follow-up with me in one day.    ____________________________ Livingston Diones. Kery Batzel, MD wlp:jm D: 08/01/2012 10:42:31 ET T: 08/01/2012 15:33:17 ET JOB#: [id number]  cc: Edwin Cherian L. Yaslene Lindamood, MD, <Dictator> Livingston Diones Crystian Frith MD ELECTRONICALLY SIGNED 08/12/2012 13:42

## 2014-12-03 ENCOUNTER — Encounter: Payer: Self-pay | Admitting: Family Medicine

## 2014-12-03 ENCOUNTER — Telehealth: Payer: Self-pay | Admitting: Family Medicine

## 2014-12-03 DIAGNOSIS — J42 Unspecified chronic bronchitis: Secondary | ICD-10-CM

## 2014-12-03 NOTE — Telephone Encounter (Signed)
Order entered

## 2014-12-23 ENCOUNTER — Ambulatory Visit (INDEPENDENT_AMBULATORY_CARE_PROVIDER_SITE_OTHER): Payer: Commercial Managed Care - HMO | Admitting: Pulmonary Disease

## 2014-12-23 ENCOUNTER — Encounter: Payer: Self-pay | Admitting: Pulmonary Disease

## 2014-12-23 VITALS — BP 116/68 | HR 70 | Temp 97.4°F | Ht 61.0 in | Wt 113.0 lb

## 2014-12-23 DIAGNOSIS — J432 Centrilobular emphysema: Secondary | ICD-10-CM

## 2014-12-23 NOTE — Patient Instructions (Signed)
Try taking the Stiolto instead of ADvair and Spiriva, let us know how you are doing with it and we can switch to this if you feel OK Keep taking the azithromycin as you are doing WE will see you back in 6 months or sooner if needed

## 2014-12-23 NOTE — Progress Notes (Signed)
Subjective:    Patient ID: Misty Ferguson, female    DOB: 1933-12-18, 81 y.o.   MRN: [id number]  Synopsis: [name] was first seen by the [facility] pulmonary clinic in the summer of 2013. She has COPD. Simple spirometry performed in clinic showed an FEV1 of 45% predicted with clear obstruction. She smoked 1-1/3 pack of cigarettes daily through 2005. She is a frequent exacerbation phenotype. She uses 2 L of oxygen continuously with exertion.  HPI  Chief Complaint  Patient presents with  . Follow-up    Pt states breathing unchanged; she walks on her treadmill daily, she use 02 at night and on as needed basis. Pt has cough on high pollen days. Pt denies chest tightness but wheezing if she over exerts.   [name] has been doing great. She continues to walk 2 miles daily.  She thinks she could go more, but she gets bored.  She uses oxygen when she walks and does OK.    No COPD exacerbations since the last visit.  No palpitations or weird symptoms.    She contiues to take the medications as detailed below.  She tried some Breo in the last year and said it didn't help, in fact it made her more short of breath.   Past Medical History  Diagnosis Date  . Unspecified disorder resulting from impaired renal function   . Type II or unspecified type diabetes mellitus without mention of complication, not stated as uncontrolled   . Obstructive chronic bronchitis with exacerbation   . Tobacco use disorder   . Unspecified glaucoma   . Macular degeneration (senile) of retina, unspecified   . Personal history of peptic ulcer disease   . Unspecified essential hypertension   . Chronic airway obstruction, not elsewhere classified   . Osteoporosis, unspecified     Review of Systems  Constitutional: Negative for fever, chills and fatigue.  HENT: Negative for congestion, hearing loss, postnasal drip and rhinorrhea.   Respiratory: Positive for shortness of breath. Negative for cough and  wheezing.   Cardiovascular: Negative for chest pain, palpitations and leg swelling.       Objective:   Physical Exam  Filed Vitals:   12/23/14 1136  BP: 116/68  Pulse: 70  Temp: 97.4 F (36.3 C)  TempSrc: Oral  Height: 5\' 1"  (1.549 m)  Weight: 113 lb (51.256 kg)  SpO2: 90%   room air  Gen: well appearing, no acute distress HEENT: NCAT, EOMi,  PULM: Poor air flow but clear to auscultation, insp wheezing in lower lobes bilaterally CV: RRR, no mgr, no JVD AB: BS+, soft, nontender, no hsm Ext: warm, no edema, no clubbing, no cyanosis  03/24/2013 EKG normal sinus rhythm, interventricular conduction delay, QTC 447  LFT's from 09/2014 reviewed> normal      Assessment & Plan:   COPD (chronic obstructive pulmonary disease) This has been a stable interval for [name]. She has not had an exacerbation of her very severe COPD. However, she has trouble with dry mouth and she is frustrated by the high cost of her medications. It would be reasonable to try Stiolto.    She has done well with daily azithromycin as she has not had a significant exacerbation since starting it over 2 years ago. We should continue this indefinitely. She has not had toxicity.  Plan: Sample of Stiolto given to use instead of Advair and Spiriva We will change her prescription if she finds benefit from this Continue daily azithromycin Check EKG to ensure  no cardiotoxicity from azithromycin Follow-up 6 months or sooner if needed     Updated Medication List Outpatient Encounter Prescriptions as of 12/23/2014  Medication Sig  . albuterol (ACCUNEB) 1.25 MG/3ML nebulizer solution Take 3 mLs (1.25 mg total) by nebulization every 6 (six) hours as needed.  . Ascorbic Acid (VITAMIN C PO) Take 1 tablet by mouth daily.  Marland Kitchen aspirin 81 MG tablet Take 81 mg by mouth every other day.   Marland Kitchen azithromycin (ZITHROMAX) 250 MG tablet Take 250 mg by mouth daily.  . Calcium Carbonate-Vitamin D (CALCIUM 600/VITAMIN D) 600-400  MG-UNIT per tablet Take 1 tablet by mouth 2 (two) times daily.    . Fluticasone-Salmeterol (ADVAIR DISKUS) 250-50 MCG/DOSE AEPB Inhale 1 puff into the lungs 2 (two) times daily.  Marland Kitchen losartan (COZAAR) 50 MG tablet Take 1 tablet (50 mg total) by mouth daily.  . Multiple Vitamins-Minerals (PRESERVISION AREDS) TABS Take 1 tablet by mouth 2 (two) times daily.  . Omega-3 Fatty Acids (FISH OIL) 1000 MG CAPS Take 1 capsule by mouth daily.  Marland Kitchen tiotropium (SPIRIVA HANDIHALER) 18 MCG inhalation capsule INHALE ONE DOSE ONCE DAILY  . [DISCONTINUED] Acetylcysteine (NAC 600 PO) Take 600 mg by mouth daily.   No facility-administered encounter medications on file as of 12/23/2014.

## 2014-12-23 NOTE — Assessment & Plan Note (Addendum)
This has been a stable interval for Misty Ferguson. She has not had an exacerbation of her very severe COPD. However, she has trouble with dry mouth and she is frustrated by the high cost of her medications. It would be reasonable to try Stiolto.    She has done well with daily azithromycin as she has not had a significant exacerbation since starting it over 2 years ago. We should continue this indefinitely. She has not had toxicity.  Plan: Sample of Stiolto given to use instead of Advair and Spiriva We will change her prescription if she finds benefit from this Continue daily azithromycin Check EKG to ensure no cardiotoxicity from azithromycin Follow-up 6 months or sooner if needed

## 2014-12-30 ENCOUNTER — Other Ambulatory Visit: Payer: Self-pay | Admitting: Family Medicine

## 2014-12-31 ENCOUNTER — Other Ambulatory Visit: Payer: Self-pay | Admitting: *Deleted

## 2014-12-31 ENCOUNTER — Telehealth: Payer: Self-pay | Admitting: *Deleted

## 2014-12-31 MED ORDER — TIOTROPIUM BROMIDE-OLODATEROL 2.5-2.5 MCG/ACT IN AERS: 2.0000 | INHALATION_SPRAY | Freq: Every day | RESPIRATORY_TRACT | Status: DC

## 2014-12-31 NOTE — Telephone Encounter (Signed)
Pt was given sample of Stiolto in office last visit. [employer] submitted PA request for medication EOC ID: [id number] Marked expedite request due to medication change. We don't want sample to run out. PA completed and faxed back to [facility].

## 2015-01-02 ENCOUNTER — Encounter: Payer: Self-pay | Admitting: Pulmonary Disease

## 2015-01-03 NOTE — Telephone Encounter (Addendum)
5.22.16 mychart email from patient: Message     Hi Dr. Lake Bells.....checking in, as you asked me to do, after taking the [place] for a week. It seems to be working well. I called [employer] and it isn't on their pharmacy list so they said they would send you a form to fill out, once that is returned to them, they will decide if they will provide it and what the cost will be. I guess I need to know if you've made contact and what the price is before having you turn in a prescription....let me know what I should do, please? Also, what were the results of the EKG, they aren't posted on My Chart....thank you very much....Lavell Luster (Kellianne) [employer] like Stiolto will require a PA Per pt's chart, Davy Pique has initiated this process on 5.20.16 and faxed the completed form back to [facility] - will check with her to see if she's received an approval/denial Unfortunately we will not know the pt's price of this medication until/if the PA is approved and the rx is run through Email sent to pt informing her of the above  Dr Lake Bells: pt is asking for an interpretation of her EKG from the 5.12.16 Sonya: any word on the PA for the [place]?  I did not see anything in triage.  Thanks!

## 2015-01-05 NOTE — Telephone Encounter (Signed)
Per BQ: Message     The EKG is OK    If we have the Stiolto forms we can complete so she can get the medication. Please let her know and offer a sample if we have them    MyChart message sent to patient informing her of her EKG results per BQ and offering her a Stiolto sample.  Pt does live in [place] - asked if she would be able to pick up sample or if she has a friend/family member that would be able to pick this up for her.

## 2015-01-06 ENCOUNTER — Encounter: Payer: Self-pay | Admitting: Pulmonary Disease

## 2015-01-11 NOTE — Telephone Encounter (Signed)
5.26.16 mychart message from pt: Message     Hi...Marland KitchenMarland KitchenI appreciate your help and my husband has to go to [place] on Wednesday morning and would be happy to stop by and pick up a sample.....could you please share your address? Thank you.   Sample left up front for pt Email sent to pt with office address  Nothing further needed; will sign off.

## 2015-01-12 ENCOUNTER — Telehealth: Payer: Self-pay | Admitting: Pulmonary Disease

## 2015-01-12 ENCOUNTER — Encounter: Payer: Self-pay | Admitting: Pulmonary Disease

## 2015-01-12 MED ORDER — TIOTROPIUM BROMIDE-OLODATEROL 2.5-2.5 MCG/ACT IN AERS: 2.0000 | INHALATION_SPRAY | Freq: Every day | RESPIRATORY_TRACT | Status: DC

## 2015-01-12 NOTE — Telephone Encounter (Signed)
I have no paperwork on this patient, unless it is in my box on B side.  I have not checked it since yesterday as I am not in the [place] office.

## 2015-01-12 NOTE — Telephone Encounter (Signed)
Checked [place]'s box and do not see forms there.  Attempted to call August from [employer] back to have them send the forms, left message on voicemail to call back.

## 2015-01-12 NOTE — Telephone Encounter (Signed)
RE:EKG and [employer] [id number]   From  [place], CMA   Sent  01/12/2015 11:06 AM     All is well, my husband picked up the sample in [place] this morning...thank you!!    Nothing further needed Will sign off

## 2015-01-12 NOTE — Telephone Encounter (Signed)
Caryl Pina - have you received any documentation from [employer]?  Please advise.

## 2015-01-12 NOTE — Telephone Encounter (Signed)
RE:EKG and [employer] [id number]   From  [facility]   To  Rinaldo Ratel, CMA   Sent  01/11/2015 6:20 PM     I wrote that we would pick up the sample in [place] on Wednesday....is that okay   Checked with Sonya in [place], luckily they do have sample of Stiolto and will hold it for pt Email sent to pt informing her of the above Asked pt if she still needs the address to that location as she was just seen at that location on 5.12.16 by BQ

## 2015-01-13 NOTE — Telephone Encounter (Signed)
Called and spoke to rep at [place]. Form is to be refaxed to [place] office. Will forward to [place] to look out for form.

## 2015-01-14 NOTE — Telephone Encounter (Signed)
I still have not received these forms as of this morning.

## 2015-01-14 NOTE — Telephone Encounter (Signed)
Did not see forms in faxed documents in triage.  Caryl Pina, have you received forms?

## 2015-01-14 NOTE — Telephone Encounter (Signed)
Stiolto is one of the preferred drugs. No PA was required though a form was received from [facility] for completion. Pt requested a sampled but her medication was shipped to the home on 01/04/15. She says she received meds.  Now patient says after 8 days of using Stiolto she felt slightly winded. She is wanting to use Stiolto, Spiriva and Advair. I told her we would send a message to BQ for suggestion.. She say she doesn't think Stiolto is strong enough by itself though its cheaper. Please advise.

## 2015-01-14 NOTE — Telephone Encounter (Signed)
Attempted to call [employer]. I could not get anyone on the phone. Will need to try back.

## 2015-01-17 NOTE — Telephone Encounter (Signed)
She should be on Stiolto and an inhaled steroid like Pulmicort, QVar, or Asmanex.  This would be cheapest and probably best.  She should not be taking Stiolto with Spiriva or Advair.  Would Rx Stiolto 2 puffs daily and ask her to figure out which inhaled steroid is covered by her insurance so we can prescribe that too.  This combination would be better than just taking Stiolto alone.

## 2015-01-17 NOTE — Telephone Encounter (Signed)
Spoke with [employer]-they will re-fax forms to front fax machine for BQ.

## 2015-01-17 NOTE — Telephone Encounter (Signed)
Pt is not wanting to follow rec's given below per Dr Lake Bells.  Pt states that she has been using all 3 meds - Spiriva, Advair and Stiolto on an alternating regimen.  Pt states that she takes Stiolto, Spiriva and Advair on a rotation - states that she never "mixes all 3" States that she takes Advair and Spiriva x 3 days then stops and takes Stiolto x 2 days then switches back to Advair and Spiriva x 3 days and keeps alternating.  Pt states that this regimen has been working fine and really does not want to stop it. Pt states that it is better on her wallet too because it is not as costly as some of these other medications.  Pt states also that she does not want to be taking a bunch of steroid inhalers because of the long term effects those can have on her organs. Pt states that she has done a lot of Internet research about steroid inhalers and does not like what she has read.  Please advise Dr Lake Bells. Thanks.

## 2015-01-18 NOTE — Telephone Encounter (Signed)
See my phone note from today in Epic

## 2015-01-18 NOTE — Telephone Encounter (Signed)
I had a tediously long and difficult conversation on the phone explaining the nature of her medications, the side effects, treatment efficacy.  Eventually she voiced understanding of why I won't prescribe Stiolto, Spiriva and Advair.    The issue here is really cost, she has several months supply of three long acting inhalers at home: Spiriva, Advair and Stiolto. She wants to try to use them as best as possible without hurting herself.  By the end of our conversation she decided that she is going to use up the rest of the Spiriva and Advair she has on hand (about three months worth) and will then switch to [facility] and an inhaled steroid (we will decide on which one when she sees [place] in clinic next).

## 2015-01-18 NOTE — Telephone Encounter (Signed)
Dr Lake Bells, patient is not scheduled to see you until November 2016 - are you wanting her to come in and see you sooner or continue what she is doing until Nov appt? Patient is not changing her regimen- states that what she is doing is working for her and has not harmed her. Pt states that the way she takes her meds "may not be scientifically proven to work or be safe mixed together but that could change in a few months with the way the FDA works." Patient wants to know what side effects (mild, moderate, severe) could come of her using these meds together - states that she is not interested in changing unless Dr Lake Bells can present her with some firm severe side effects and reasons why it is not scientifically supported.  Pt states that she is not very interested in coming back for an OV earlier than Nov because it costs her so much each visit.   Please advise Dr Lake Bells. Thanks.

## 2015-01-18 NOTE — Telephone Encounter (Signed)
She is asking me to prescribe a medication regimen that is very confusing and not supported by scientific evidence.  Just tell her we will talk about this on her next visit and tell her to bring her internet research sources to the next clinic visit.

## 2015-01-28 ENCOUNTER — Telehealth: Payer: Self-pay | Admitting: *Deleted

## 2015-01-28 NOTE — Telephone Encounter (Signed)
Pt had requested sample of Stiolto. Sampled up front for pick-up on 01/13/15. Called patient and let her know sample ready for p/u. Next day spoke with patient's mail order pharmacy who confirmed delivery of patient meds. Called patient and she says she did receive meds. Sample has been placed by into inventory.

## 2015-03-01 ENCOUNTER — Other Ambulatory Visit: Payer: Self-pay | Admitting: Family Medicine

## 2015-03-03 ENCOUNTER — Other Ambulatory Visit: Payer: Self-pay

## 2015-03-03 MED ORDER — AZITHROMYCIN 250 MG PO TABS
250.0000 mg | ORAL_TABLET | Freq: Every day | ORAL | Status: DC
Start: 2015-03-03 — End: 2015-08-27

## 2015-03-17 ENCOUNTER — Encounter: Payer: Self-pay | Admitting: Family Medicine

## 2015-03-17 DIAGNOSIS — H409 Unspecified glaucoma: Secondary | ICD-10-CM

## 2015-03-31 DIAGNOSIS — S2232XA Fracture of one rib, left side, initial encounter for closed fracture: Secondary | ICD-10-CM | POA: Diagnosis not present

## 2015-04-01 ENCOUNTER — Telehealth: Payer: Self-pay | Admitting: Family Medicine

## 2015-04-01 ENCOUNTER — Encounter: Payer: Self-pay | Admitting: Family Medicine

## 2015-04-01 ENCOUNTER — Ambulatory Visit
Admission: RE | Admit: 2015-04-01 | Discharge: 2015-04-01 | Disposition: A | Discharge status: Home / Self Care | Payer: Commercial Managed Care - HMO | Source: Ambulatory Visit | Attending: Family Medicine | Admitting: Family Medicine

## 2015-04-01 ENCOUNTER — Ambulatory Visit: Payer: Commercial Managed Care - HMO | Admitting: Family Medicine

## 2015-04-01 ENCOUNTER — Ambulatory Visit (INDEPENDENT_AMBULATORY_CARE_PROVIDER_SITE_OTHER): Payer: Commercial Managed Care - HMO | Admitting: Family Medicine

## 2015-04-01 VITALS — BP 142/70 | HR 65 | Temp 97.4°F | Ht 61.0 in | Wt 116.5 lb

## 2015-04-01 DIAGNOSIS — I2510 Atherosclerotic heart disease of native coronary artery without angina pectoris: Secondary | ICD-10-CM | POA: Insufficient documentation

## 2015-04-01 DIAGNOSIS — S2242XA Multiple fractures of ribs, left side, initial encounter for closed fracture: Secondary | ICD-10-CM | POA: Diagnosis not present

## 2015-04-01 DIAGNOSIS — W19XXXA Unspecified fall, initial encounter: Secondary | ICD-10-CM | POA: Insufficient documentation

## 2015-04-01 DIAGNOSIS — K861 Other chronic pancreatitis: Secondary | ICD-10-CM | POA: Insufficient documentation

## 2015-04-01 DIAGNOSIS — J439 Emphysema, unspecified: Secondary | ICD-10-CM | POA: Insufficient documentation

## 2015-04-01 NOTE — Progress Notes (Signed)
   Subjective:    Patient ID: Misty Ferguson, female    DOB: 1934/03/04, 81 y.o.   MRN: [id number]  HPI  81 year old female presents following a fall on 03/31/2015 In [place] resulting in 3 rib fractures in  Left chest wall. She has a history of severe COPD on oxygen at night.  [facility]  On 8/18/ CXR showed fractures of ribs 7-10. No pneumothorax.  Fall occurred after dogs hit her. Heard snap when she fell. Pain left chest wall. No proceeding symptoms, no SOB, no CP, no fever.  Given tramadol and oxycodone/acteominophen for pain.  She has tried percocet for pain.Marland Kitchen Helps some but not much.   She has been doing deep breathing, using incentive spirometry. No change in breathing.  Reviewed notes and X-ray.    Review of Systems  Constitutional: Negative for fever and fatigue.  HENT: Negative for ear pain.   Eyes: Negative for pain.  Respiratory: Negative for chest tightness and shortness of breath.   Cardiovascular: Negative for chest pain, palpitations and leg swelling.  Gastrointestinal: Negative for abdominal pain.  Genitourinary: Negative for dysuria.       Objective:   Physical Exam  Constitutional: Vital signs are normal. She appears well-developed and well-nourished. She is cooperative.  Non-toxic appearance. She does not appear ill. No distress.  HENT:  Head: Normocephalic.  Right Ear: Hearing, tympanic membrane, external ear and ear canal normal. Tympanic membrane is not erythematous, not retracted and not bulging.  Left Ear: Hearing, tympanic membrane, external ear and ear canal normal. Tympanic membrane is not erythematous, not retracted and not bulging.  Nose: No mucosal edema or rhinorrhea. Right sinus exhibits no maxillary sinus tenderness and no frontal sinus tenderness. Left sinus exhibits no maxillary sinus tenderness and no frontal sinus tenderness.  Mouth/Throat: Uvula is midline, oropharynx is clear and moist and mucous membranes are normal.   Eyes: Conjunctivae, EOM and lids are normal. Pupils are equal, round, and reactive to light. Lids are everted and swept, no foreign bodies found.  Neck: Trachea normal and normal range of motion. Neck supple. Carotid bruit is not present. No thyroid mass and no thyromegaly present.  Cardiovascular: Normal rate, regular rhythm, S1 normal, S2 normal, normal heart sounds, intact distal pulses and normal pulses.  Exam reveals no gallop and no friction rub.   No murmur heard. Pulmonary/Chest: Effort normal and breath sounds normal. No tachypnea. No respiratory distress. She has no decreased breath sounds. She has no wheezes. She has no rhonchi. She has no rales. She exhibits tenderness and bony tenderness. She exhibits no laceration.  Contusion in left mid back, ttp over left mid back at rib 8  Abdominal: Soft. Normal appearance and bowel sounds are normal. There is no tenderness.  Neurological: She is alert.  Skin: Skin is warm, dry and intact. No rash noted.  Psychiatric: Her speech is normal and behavior is normal. Judgment and thought content normal. Her mood appears not anxious. Cognition and memory are normal. She does not exhibit a depressed mood.          Assessment & Plan:

## 2015-04-01 NOTE — Telephone Encounter (Signed)
Pt's spouse called and would like to set up an appointment for a CT scan for today for pt who was seen for a fall 03/31/15 at an urgent care in [place].  Pt has fractured ribs.  Currently driving home, they do have the CD of xrays with them.  Scheduled appointment for today @ 4:00 pm.  Dr. [name] would like additional clinical information.  Best number to call spouse is [phone] / lt

## 2015-04-01 NOTE — Telephone Encounter (Signed)
Spoke with Misty Ferguson; Misty Ferguson fell on 03/31/15 against a wooden wall on her lt side at shoulder blade and fx ribs 7-10. Misty Ferguson has back pain in the area where ribs are fx and when takes deep breath experiences more pain in rib area . No shoulder pain and no pain anywhere in body except in back where ribs are fx,no more difficulty breathing than usual;Misty Ferguson has COPD. Misty Ferguson said doctor in [place] told Misty Ferguson might need to get CT scan done. Misty Ferguson is bringing paperwork and CD of xrays with her.Misty Ferguson has appt with Dr [name] 04/01/15 at 2:30 PM.

## 2015-04-01 NOTE — Progress Notes (Signed)
Pre visit review using our clinic review tool, if applicable. No additional management support is needed unless otherwise documented below in the visit note. 

## 2015-04-01 NOTE — Assessment & Plan Note (Signed)
Deep breaths. Incentive spirometry. Pain control with ibuprofen tramadol or percocet. Eval with CT for alignment given multiple fractures.

## 2015-04-01 NOTE — Patient Instructions (Signed)
Deep breaths. Incentive spirometry. Pain control with ibuprofen tramadol or percocet.  Call if  Increase in shortness of breath, fever, new cough. Stop at front desk to set up CT.

## 2015-04-05 ENCOUNTER — Encounter: Payer: Self-pay | Admitting: Family Medicine

## 2015-04-07 ENCOUNTER — Encounter: Payer: Self-pay | Admitting: Family Medicine

## 2015-04-07 MED ORDER — TRAMADOL HCL 50 MG PO TABS: 50.0000 mg | ORAL_TABLET | Freq: Four times a day (QID) | ORAL | Status: DC | PRN

## 2015-04-07 NOTE — Telephone Encounter (Signed)
Tramadol called into [facility] [address]

## 2015-04-08 ENCOUNTER — Encounter: Payer: Self-pay | Admitting: Family Medicine

## 2015-04-08 DIAGNOSIS — K861 Other chronic pancreatitis: Secondary | ICD-10-CM

## 2015-04-08 MED ORDER — TRAMADOL HCL 50 MG PO TABS: 50.0000 mg | ORAL_TABLET | Freq: Four times a day (QID) | ORAL | Status: DC | PRN

## 2015-04-08 NOTE — Telephone Encounter (Signed)
[facility] Prescription for Tramadol that was called in yesterday had not been picked up yet. Tramadol prescription with new directions and quantity called into [facility] [address] as instructed by Dr. [name].

## 2015-04-08 NOTE — Telephone Encounter (Signed)
Please call pharmacy. Change tramadol prescription to 2 tab po every 6 hours for pain  #60 ) RF as long as first prescription has not been picked up by patient. If it has, change quantity to 30. Let pt know this has been done.

## 2015-04-13 ENCOUNTER — Other Ambulatory Visit (INDEPENDENT_AMBULATORY_CARE_PROVIDER_SITE_OTHER): Payer: Commercial Managed Care - HMO

## 2015-04-13 DIAGNOSIS — K861 Other chronic pancreatitis: Secondary | ICD-10-CM

## 2015-04-13 LAB — COMPREHENSIVE METABOLIC PANEL
ALK PHOS: 80 U/L (ref 39–117)
ALT: 31 U/L (ref 0–35)
AST: 29 U/L (ref 0–37)
Albumin: 3.9 g/dL (ref 3.5–5.2)
BUN: 15 mg/dL (ref 6–23)
CHLORIDE: 101 meq/L (ref 96–112)
CO2: 30 meq/L (ref 19–32)
Calcium: 9.5 mg/dL (ref 8.4–10.5)
Creatinine, Ser: 1.07 mg/dL (ref 0.40–1.20)
GFR: 52.59 mL/min — AB (ref >60.00)
GLUCOSE: 96 mg/dL (ref 70–99)
POTASSIUM: 4.3 meq/L (ref 3.5–5.1)
SODIUM: 138 meq/L (ref 135–145)
Total Bilirubin: 0.3 mg/dL (ref 0.2–1.2)
Total Protein: 6.3 g/dL (ref 6.0–8.3)

## 2015-04-13 LAB — LIPASE: Lipase: 8 U/L — ABNORMAL LOW (ref 11.0–59.0)

## 2015-04-13 LAB — AMYLASE: Amylase: 24 U/L — ABNORMAL LOW (ref 27–131)

## 2015-04-15 ENCOUNTER — Other Ambulatory Visit: Payer: Commercial Managed Care - HMO

## 2015-04-15 DIAGNOSIS — H3531 Nonexudative age-related macular degeneration: Secondary | ICD-10-CM | POA: Diagnosis not present

## 2015-04-15 LAB — HM DIABETES EYE EXAM

## 2015-04-21 ENCOUNTER — Ambulatory Visit (INDEPENDENT_AMBULATORY_CARE_PROVIDER_SITE_OTHER): Payer: Commercial Managed Care - HMO

## 2015-04-21 ENCOUNTER — Other Ambulatory Visit: Payer: Self-pay | Admitting: Family Medicine

## 2015-04-21 DIAGNOSIS — Z23 Encounter for immunization: Secondary | ICD-10-CM

## 2015-04-21 MED ORDER — TRAMADOL HCL 50 MG PO TABS: 50.0000 mg | ORAL_TABLET | Freq: Three times a day (TID) | ORAL | Status: DC | PRN

## 2015-04-21 NOTE — Telephone Encounter (Signed)
Called into [facility] [address] 

## 2015-04-21 NOTE — Telephone Encounter (Signed)
Last office visit 04/01/2015.  Last refilled 04/08/2015 for #60 with no refills.  Ok to refill?

## 2015-06-09 ENCOUNTER — Encounter: Payer: Self-pay | Admitting: Family Medicine

## 2015-06-09 DIAGNOSIS — J449 Chronic obstructive pulmonary disease, unspecified: Secondary | ICD-10-CM

## 2015-06-22 ENCOUNTER — Encounter: Payer: Self-pay | Admitting: *Deleted

## 2015-06-23 ENCOUNTER — Encounter: Payer: Self-pay | Admitting: Internal Medicine

## 2015-06-23 ENCOUNTER — Ambulatory Visit (INDEPENDENT_AMBULATORY_CARE_PROVIDER_SITE_OTHER): Payer: Commercial Managed Care - HMO | Admitting: Internal Medicine

## 2015-06-23 VITALS — BP 128/82 | HR 74 | Ht 61.0 in | Wt 111.0 lb

## 2015-06-23 DIAGNOSIS — J439 Emphysema, unspecified: Secondary | ICD-10-CM | POA: Diagnosis not present

## 2015-06-23 MED ORDER — UMECLIDINIUM BROMIDE 62.5 MCG/INH IN AEPB: 1.0000 | INHALATION_SPRAY | Freq: Every day | RESPIRATORY_TRACT | Status: AC

## 2015-06-23 MED ORDER — FLUTICASONE FUROATE-VILANTEROL 100-25 MCG/INH IN AEPB: 1.0000 | INHALATION_SPRAY | Freq: Every day | RESPIRATORY_TRACT | Status: DC

## 2015-06-23 MED ORDER — FLUTICASONE FUROATE-VILANTEROL 100-25 MCG/INH IN AEPB: 1.0000 | INHALATION_SPRAY | Freq: Every day | RESPIRATORY_TRACT | Status: AC

## 2015-06-23 MED ORDER — UMECLIDINIUM BROMIDE 62.5 MCG/INH IN AEPB: 1.0000 | INHALATION_SPRAY | Freq: Every day | RESPIRATORY_TRACT | Status: DC

## 2015-06-23 NOTE — Patient Instructions (Signed)
Chronic Obstructive Pulmonary Disease Chronic obstructive pulmonary disease (COPD) is a common lung condition in which airflow from the lungs is limited. COPD is a general term that can be used to describe many different lung problems that limit airflow, including both chronic bronchitis and emphysema. If you have COPD, your lung function will probably never return to normal, but there are measures you can take to improve lung function and make yourself feel better. CAUSES   Smoking (common).  Exposure to secondhand smoke.  Genetic problems.  Chronic inflammatory lung diseases or recurrent infections. SYMPTOMS  Shortness of breath, especially with physical activity.  Deep, persistent (chronic) cough with a large amount of thick mucus.  Wheezing.  Rapid breaths (tachypnea).  Gray or bluish discoloration (cyanosis) of the skin, especially in your fingers, toes, or lips.  Fatigue.  Weight loss.  Frequent infections or episodes when breathing symptoms become much worse (exacerbations).  Chest tightness. DIAGNOSIS Your health care provider will take a medical history and perform a physical examination to diagnose COPD. Additional tests for COPD may include:  Lung (pulmonary) function tests.  Chest X-ray.  CT scan.  Blood tests. TREATMENT  Treatment for COPD may include:  Inhaler and nebulizer medicines. These help manage the symptoms of COPD and make your breathing more comfortable.  Supplemental oxygen. Supplemental oxygen is only helpful if you have a low oxygen level in your blood.  Exercise and physical activity. These are beneficial for nearly all people with COPD.  Lung surgery or transplant.  Nutrition therapy to gain weight, if you are underweight.  Pulmonary rehabilitation. This may involve working with a team of health care providers and specialists, such as respiratory, occupational, and physical therapists. HOME CARE INSTRUCTIONS  Take all medicines  (inhaled or pills) as directed by your health care provider.  Avoid over-the-counter medicines or cough syrups that dry up your airway (such as antihistamines) and slow down the elimination of secretions unless instructed otherwise by your health care provider.  If you are a smoker, the most important thing that you can do is stop smoking. Continuing to smoke will cause further lung damage and breathing trouble. Ask your health care provider for help with quitting smoking. He or she can direct you to community resources or hospitals that provide support.  Avoid exposure to irritants such as smoke, chemicals, and fumes that aggravate your breathing.  Use oxygen therapy and pulmonary rehabilitation if directed by your health care provider. If you require home oxygen therapy, ask your health care provider whether you should purchase a pulse oximeter to measure your oxygen level at home.  Avoid contact with individuals who have a contagious illness.  Avoid extreme temperature and humidity changes.  Eat healthy foods. Eating smaller, more frequent meals and resting before meals may help you maintain your strength.  Stay active, but balance activity with periods of rest. Exercise and physical activity will help you maintain your ability to do things you want to do.  Preventing infection and hospitalization is very important when you have COPD. Make sure to receive all the vaccines your health care provider recommends, especially the pneumococcal and influenza vaccines. Ask your health care provider whether you need a pneumonia vaccine.  Learn and use relaxation techniques to manage stress.  Learn and use controlled breathing techniques as directed by your health care provider. Controlled breathing techniques include:  Pursed lip breathing. Start by breathing in (inhaling) through your nose for 1 second. Then, purse your lips as if you were   going to whistle and breathe out (exhale) through the  pursed lips for 2 seconds.  Diaphragmatic breathing. Start by putting one hand on your abdomen just above your waist. Inhale slowly through your nose. The hand on your abdomen should move out. Then purse your lips and exhale slowly. You should be able to feel the hand on your abdomen moving in as you exhale.  Learn and use controlled coughing to clear mucus from your lungs. Controlled coughing is a series of short, progressive coughs. The steps of controlled coughing are: 1. Lean your head slightly forward. 2. Breathe in deeply using diaphragmatic breathing. 3. Try to hold your breath for 3 seconds. 4. Keep your mouth slightly open while coughing twice. 5. Spit any mucus out into a tissue. 6. Rest and repeat the steps once or twice as needed. SEEK MEDICAL CARE IF:  You are coughing up more mucus than usual.  There is a change in the color or thickness of your mucus.  Your breathing is more labored than usual.  Your breathing is faster than usual. SEEK IMMEDIATE MEDICAL CARE IF:  You have shortness of breath while you are resting.  You have shortness of breath that prevents you from:  Being able to talk.  Performing your usual physical activities.  You have chest pain lasting longer than 5 minutes.  Your skin color is more cyanotic than usual.  You measure low oxygen saturations for longer than 5 minutes with a pulse oximeter. MAKE SURE YOU:  Understand these instructions.  Will watch your condition.  Will get help right away if you are not doing well or get worse.   This information is not intended to replace advice given to you by your health care provider. Make sure you discuss any questions you have with your health care provider.   Document Released: 05/09/2005 Document Revised: 08/20/2014 Document Reviewed: 03/26/2013 Elsevier Interactive Patient Education 2016 Elsevier Inc.  

## 2015-06-23 NOTE — Addendum Note (Signed)
Addended by: Oscar La R on: 06/23/2015 09:55 AM   Modules accepted: Orders

## 2015-06-23 NOTE — Progress Notes (Signed)
[place] [facility] Consultation     Date: 06/23/2015,   MRN# [id number] Misty Ferguson [birth year] Code Status:  Hosp day:@LENGTHOFSTAYDAYS @ Referring MD: @ATDPROV @     PCP:      AdmissionWeight: 111 lb (50.349 kg)                 CurrentWeight: 111 lb (50.349 kg)  Synopsis: Misty Ferguson was first seen by the [facility] pulmonary clinic in the summer of 2013. She has COPD. Simple spirometry performed in clinic showed an FEV1 of 45% predicted with clear obstruction.  She smoked 2 pack/day x 40 years, quit  17 years ago according to patient. She is a frequent exacerbation phenotype. She uses 2 L of oxygen continuously with exertion and at night On chronic azithromycin therapy: therapy for prevention of COPD exacerbations (ie. roflumilast or azithromycin) when appopriate. FLU SHOT up to date 2016      CC follow up COPD HPI Patient had fall in 03/2015 broke 8 ribs, no PTX,  Ct chest 03/2015 images reveiwed with patient 06/23/2015 Severe emphysema with bronchiectasis with R plueral based nodule NO infection at this time, no acute SOB or Chest pain Uses oxygen accordingly, no wheezing or cough at this time     MEDICATIONS     Current Medication:   Current outpatient prescriptions:  .  ADVAIR DISKUS 250-50 MCG/DOSE AEPB, INHALE 1 PUFF INTO THE LUNGS 2 TIMES DAILY., Disp: 180 each, Rfl: 1 .  albuterol (ACCUNEB) 1.25 MG/3ML nebulizer solution, Take 3 mLs (1.25 mg total) by nebulization every 6 (six) hours as needed., Disp: 75 mL, Rfl: 5 .  Ascorbic Acid (VITAMIN C PO), Take 1 tablet by mouth daily., Disp: , Rfl:  .  aspirin 81 MG tablet, Take 81 mg by mouth every other day. , Disp: , Rfl:  .  azithromycin (ZITHROMAX) 250 MG tablet, Take 1 tablet (250 mg total) by mouth daily., Disp: 90 each, Rfl: 1 .  Calcium Carbonate-Vitamin D (CALCIUM 600/VITAMIN D) 600-400 MG-UNIT per tablet, Take 1 tablet by mouth 2 (two) times daily.  , Disp: , Rfl:  .  losartan  (COZAAR) 50 MG tablet, TAKE 1 TABLET (50 MG TOTAL) BY MOUTH DAILY., Disp: 90 tablet, Rfl: 1 .  Multiple Vitamins-Minerals (PRESERVISION AREDS) TABS, Take 1 tablet by mouth 2 (two) times daily., Disp: , Rfl:  .  Omega-3 Fatty Acids (FISH OIL) 1000 MG CAPS, Take 1 capsule by mouth daily., Disp: , Rfl:      ALLERGIES   Review of patient's allergies indicates no known allergies.     REVIEW OF SYSTEMS   Review of Systems  Constitutional: Negative for fever, chills, weight loss and malaise/fatigue.  Respiratory: Positive for shortness of breath. Negative for cough, hemoptysis and wheezing.   Cardiovascular: Negative for chest pain, palpitations and leg swelling.  Endo/Heme/Allergies: Bruises/bleeds easily.     VS: BP 128/82 mmHg  Pulse 74  Ht 5\' 1"  (1.549 m)  Wt 111 lb (50.349 kg)  BMI 20.98 kg/m2  SpO2 99%     PHYSICAL EXAM   Physical Exam  Constitutional: She is oriented to person, place, and time. No distress.  Eyes: Pupils are equal, round, and reactive to light.  Cardiovascular: Normal rate, regular rhythm and normal heart sounds.   No murmur heard. Pulmonary/Chest: Effort normal and breath sounds normal. No respiratory distress. She has no wheezes. She has no rales.  Neurological: She is alert and oriented to person, place, and time.  Skin: Skin is  warm. She is not diaphoretic.         ASSESSMENT/PLAN   81 yo white female with moderate/severe COPD on chronic oxygen therapy without any acute issues at this time, CT chest shows severe emphysema and Bronchiectasis and RLL pleural based nodule  1.continue inhaled steroids/LABA and AC(change Breo and Incruse) 2.continue oxygen as needed 3.continue azithromycin for COPD exacerbation prevention 4.Exercises daily (40 minutes daily) I encouraged that she start an upper body weight routine in addition to her cardio routine; no real reason to send her to pulmonary rehab  Patient DOES NOT WANT repeat CT chest to assess  interval changes. SHE DOES NOT want to pursue any further diagnostic testing. She states that "She is happy with her life and happy with the [name] and if its her time to go, she will go happily"   I have personally obtained a history, examined the patient, evaluated laboratory and independently reviewed imaging results, formulated the assessment and plan and placed orders.  The Patient requires high complexity decision making for assessment and support, frequent evaluation and titration of therapies, application of advanced monitoring technologies and extensive interpretation of multiple databases.  Patient satisfied with Plan of action and management. All questions answered   Corrin Parker, M.D.  [facility]  Medical Director [place] Director [facility] Cardio-Pulmonary Department

## 2015-07-19 ENCOUNTER — Encounter: Payer: Self-pay | Admitting: Family Medicine

## 2015-07-20 ENCOUNTER — Other Ambulatory Visit: Payer: Self-pay | Admitting: Internal Medicine

## 2015-07-28 ENCOUNTER — Other Ambulatory Visit: Payer: Self-pay | Admitting: Family Medicine

## 2015-08-27 ENCOUNTER — Other Ambulatory Visit: Payer: Self-pay | Admitting: Pulmonary Disease

## 2015-09-19 ENCOUNTER — Telehealth: Payer: Self-pay | Admitting: Family Medicine

## 2015-09-19 DIAGNOSIS — E119 Type 2 diabetes mellitus without complications: Secondary | ICD-10-CM

## 2015-09-19 DIAGNOSIS — M810 Age-related osteoporosis without current pathological fracture: Secondary | ICD-10-CM

## 2015-09-19 DIAGNOSIS — R809 Proteinuria, unspecified: Secondary | ICD-10-CM

## 2015-09-19 DIAGNOSIS — E7800 Pure hypercholesterolemia, unspecified: Secondary | ICD-10-CM

## 2015-09-19 NOTE — Telephone Encounter (Signed)
-----   Message from Ellamae Sia sent at 09/12/2015 10:25 AM EST ----- Regarding: Lab orders for Tuesday, 2.7.17 Patient is scheduled for CPX labs, please order future labs, Thanks , Karna Christmas

## 2015-09-20 ENCOUNTER — Other Ambulatory Visit: Payer: Commercial Managed Care - HMO

## 2015-09-20 ENCOUNTER — Other Ambulatory Visit (INDEPENDENT_AMBULATORY_CARE_PROVIDER_SITE_OTHER): Payer: Commercial Managed Care - HMO

## 2015-09-20 ENCOUNTER — Telehealth: Payer: Self-pay | Admitting: Family Medicine

## 2015-09-20 DIAGNOSIS — E119 Type 2 diabetes mellitus without complications: Secondary | ICD-10-CM

## 2015-09-20 DIAGNOSIS — M810 Age-related osteoporosis without current pathological fracture: Secondary | ICD-10-CM | POA: Diagnosis not present

## 2015-09-20 DIAGNOSIS — E7800 Pure hypercholesterolemia, unspecified: Secondary | ICD-10-CM | POA: Diagnosis not present

## 2015-09-20 LAB — COMPREHENSIVE METABOLIC PANEL
ALBUMIN: 4.5 g/dL (ref 3.5–5.2)
ALT: 25 U/L (ref 0–35)
AST: 26 U/L (ref 0–37)
Alkaline Phosphatase: 49 U/L (ref 39–117)
BUN: 23 mg/dL (ref 6–23)
CHLORIDE: 103 meq/L (ref 96–112)
CO2: 31 meq/L (ref 19–32)
Calcium: 10.1 mg/dL (ref 8.4–10.5)
Creatinine, Ser: 0.89 mg/dL (ref 0.40–1.20)
GFR: 64.98 mL/min (ref >60.00)
GLUCOSE: 117 mg/dL — AB (ref 70–99)
POTASSIUM: 4.3 meq/L (ref 3.5–5.1)
SODIUM: 140 meq/L (ref 135–145)
TOTAL PROTEIN: 6.8 g/dL (ref 6.0–8.3)
Total Bilirubin: 0.6 mg/dL (ref 0.2–1.2)

## 2015-09-20 LAB — LIPID PANEL
Cholesterol: 242 mg/dL — ABNORMAL HIGH (ref 0–200)
HDL: 133 mg/dL (ref >39.00)
LDL Cholesterol: 98 mg/dL (ref 0–99)
NONHDL: 109.48
TRIGLYCERIDES: 56 mg/dL (ref 0.0–149.0)
Total CHOL/HDL Ratio: 2
VLDL: 11.2 mg/dL (ref 0.0–40.0)

## 2015-09-20 LAB — VITAMIN D 25 HYDROXY (VIT D DEFICIENCY, FRACTURES): VITD: 45.95 ng/mL (ref 30.00–100.00)

## 2015-09-20 LAB — HEMOGLOBIN A1C: HEMOGLOBIN A1C: 6 % (ref 4.6–6.5)

## 2015-09-20 NOTE — Telephone Encounter (Signed)
-----   Message from Marchia Bond sent at 09/20/2015  7:39 AM EST ----- Regarding: Cpx labs today, need orders. Thanks! :-) Please order  future cpx labs for pt's upcoming lab appt. Thanks Aniceto Boss

## 2015-09-22 ENCOUNTER — Encounter: Payer: Self-pay | Admitting: Family Medicine

## 2015-09-22 ENCOUNTER — Ambulatory Visit (INDEPENDENT_AMBULATORY_CARE_PROVIDER_SITE_OTHER): Payer: Commercial Managed Care - HMO | Admitting: Family Medicine

## 2015-09-22 VITALS — BP 130/74 | HR 94 | Temp 97.6°F | Ht 61.0 in | Wt 108.8 lb

## 2015-09-22 DIAGNOSIS — J432 Centrilobular emphysema: Secondary | ICD-10-CM | POA: Diagnosis not present

## 2015-09-22 DIAGNOSIS — E7800 Pure hypercholesterolemia, unspecified: Secondary | ICD-10-CM

## 2015-09-22 DIAGNOSIS — Z0000 Encounter for general adult medical examination without abnormal findings: Secondary | ICD-10-CM | POA: Diagnosis not present

## 2015-09-22 DIAGNOSIS — E119 Type 2 diabetes mellitus without complications: Secondary | ICD-10-CM | POA: Diagnosis not present

## 2015-09-22 DIAGNOSIS — I10 Essential (primary) hypertension: Secondary | ICD-10-CM

## 2015-09-22 DIAGNOSIS — R809 Proteinuria, unspecified: Secondary | ICD-10-CM

## 2015-09-22 DIAGNOSIS — Z7189 Other specified counseling: Secondary | ICD-10-CM

## 2015-09-22 DIAGNOSIS — J9611 Chronic respiratory failure with hypoxia: Secondary | ICD-10-CM

## 2015-09-22 LAB — HM DIABETES FOOT EXAM

## 2015-09-22 NOTE — Assessment & Plan Note (Signed)
On 2 L Oxygen

## 2015-09-22 NOTE — Assessment & Plan Note (Signed)
Well controlled. Continue current medication.  

## 2015-09-22 NOTE — Patient Instructions (Signed)
Keep up great work with healthy eating and pulmonary rehab! Try to decrease down to 1 glass of wine a night.

## 2015-09-22 NOTE — Progress Notes (Signed)
Pre visit review using our clinic review tool, if applicable. No additional management support is needed unless otherwise documented below in the visit note. 

## 2015-09-22 NOTE — Assessment & Plan Note (Signed)
Has living will, HCPOA: Rosine Abe.  Full code. She is strongly considering becoming DNR. (Reviewed 2017)

## 2015-09-22 NOTE — Assessment & Plan Note (Signed)
ON losartan.

## 2015-09-22 NOTE — Progress Notes (Signed)
I have personally reviewed the Medicare Annual Wellness questionnaire and have noted 1. The patient's medical and social history 2. Their use of alcohol, tobacco or illicit drugs 3. Their current medications and supplements 4. The patient's functional ability including ADL's, fall risks, home safety risks and hearing or visual             impairment. 5. Diet and physical activities 6. Evidence for depression or mood disorders 7.         Updated provider list Cognitive evaluation was performed and recorded on pt medicare questionnaire form. The patients weight, height, BMI and visual acuity have been recorded in the chart  I have made referrals, counseling and provided education to the patient based review of the above and I have provided the pt with a written personalized care plan for preventive services.   Severe COPD, improved:She is on oxygen at night and with exertion as needed during the day for severe COPD. On chronic azithromycin. Followed  By [facility], Last OV Dr. Mortimer Fries 06/2015 No changes made. No exacerbation in last year.   Fell in 03/2015. Tripped on leash of dogs.  Fractures healed.  Diabetes: Well controlled. Diet. Lab Results  Component Value Date   HGBA1C 6.0 09/20/2015  Using medications without difficulties: On no meds.  Hypoglycemic episodes: ?  Hyperglycemic episodes:?  Feet problems:None  Blood Sugars averaging:not checking.  eye exam within last year: yes   Hypertension: At goal on losartan  BP Readings from Last 3 Encounters:  09/22/15 130/74  06/23/15 128/82  04/01/15 142/70  Using medication without problems or lightheadedness: None Occ HR elevations At home noted on pulse ox 123-134. Usually SOB at that time. Chest pain with exertion:None  Edema:None  Short of breath: stable  Average home BPs: not checking  Other issues:   Elevated Cholesterol: LDL at goal  < 100 on no med. Lab Results  Component Value Date   CHOL 242* 09/20/2015   HDL  133.00 09/20/2015   LDLCALC 98 09/20/2015   LDLDIRECT 99.1 09/08/2013   TRIG 56.0 09/20/2015   CHOLHDL 2 09/20/2015  Using medications without problems: On no rx medication.  Diet compliance: excellent Exercise:Good, On treadmill 6-7 times a week. 2 miles.  Social History /Family History/Past Medical History reviewed and updated if needed.  Review of Systems  Constitutional: Negative for fever, fatigue and unexpected weight change.  HENT: Negative for ear pain, congestion, sore throat, sneezing, trouble swallowing and sinus pressure.  Eyes: Negative for pain and itching.  Respiratory: Stable cough and shortness of breath.  Cardiovascular: Negative for chest pain, palpitations and leg swelling.  Gastrointestinal: Negative for nausea, abdominal pain, diarrhea,  HAS constipation uses stool softner twice daily and blood in stool.  Genitourinary: Negative for dysuria, hematuria, vaginal discharge, difficulty urinating and menstrual problem.  Skin: Negative for rash.  Neurological: Negative for syncope, weakness, light-headedness, numbness and headaches.  Psychiatric/Behavioral: Negative for confusion and dysphoric mood. The patient is not nervous/anxious.  Objective:   Physical Exam  Constitutional: Vital signs are normal. She appears well-developed and well-nourished. She is cooperative. Non-toxic appearance. She does not appear ill. No distress.  HENT:  Head: Normocephalic.  Right Ear: Hearing, tympanic membrane, external ear and ear canal normal.  Left Ear: Hearing, tympanic membrane, external ear and ear canal normal.  Nose: Nose normal.  Eyes: Conjunctivae, EOM and lids are normal. Pupils are equal, round, and reactive to light. No foreign bodies found.  Neck: Trachea normal and normal range of motion.  Neck supple. Carotid bruit is not present. No mass and no thyromegaly present.  Cardiovascular: Normal rate, regular rhythm, S1 normal, S2 normal, normal heart  sounds and intact distal pulses. Exam reveals no gallop.  No murmur heard.  Pulmonary/Chest: Effort normal. No respiratory distress. She has decreased breath sounds in throughout She has no wheezes. She has no rhonchi. She has no rales.  St.able exam  Abdominal: Soft. Normal appearance and bowel sounds are normal. She exhibits no distension, no fluid wave, no abdominal bruit and no mass. There is no hepatosplenomegaly. There is no tenderness. There is no rebound, no guarding and no CVA tenderness. No hernia.  Genitourinary: Nml breast exam no masses, no axillary lymphadenopathy  PAP/ DVE not indicated  Diffuse atrophy in left quad laterally, tenderness To palpation. Lymphadenopathy:  She has no cervical adenopathy.  She has no axillary adenopathy.  Neurological: She is alert. She has normal strength. No cranial nerve deficit or sensory deficit.  Skin: Skin is warm, dry and intact. No rash noted. Hyperpigmentation of B anterior callve, nml pulses Bilaterall, hair growth on legs. Psychiatric: Her speech is normal and behavior is normal. Judgment normal. Her mood appears not anxious. Cognition and memory are normal. She does not exhibit a depressed mood.   MSK: area of swelling in left lower thigh, not at knee.. She calls it her hematoma ( where she was injured multiple times) mildly ttp, no redness, no warmth.  Diabetic foot exam: Normal inspection No skin breakdown No calluses  Normal DP pulses Normal sensation to light touch and monofilament Nails normal  Assessment & Plan:   Annual Medicare Wellness: The patient's preventative maintenance and recommended screening tests for an annual wellness exam were reviewed in full today.  Brought up to date unless services declined.  Counselled on the importance of diet, exercise, and its role in overall health and mortality.  The patient's FH and SH was reviewed, including their home life, tobacco status, and drug and alcohol status.    Vaccines:Uptodate Mammogram: last in 2009, not interested in continuing to follow.  DEXA: on fosamax (many years about 10 years) last checked 2009... Stopped fosamax,  She is not interested in further DEXA. Vit D in nml range Colon: Not interested in colon cancer screening. Discussed the risks and benefits of cancer screening... Pt voices understanding an chooses to not proceed with prevention as noted above.  " I am ready to go if the lord takes me" No indication for pap/DVE.

## 2015-09-22 NOTE — Assessment & Plan Note (Signed)
Stable control. Followed by Pulm.

## 2015-09-22 NOTE — Assessment & Plan Note (Signed)
At goal on no medication. 

## 2015-09-22 NOTE — Assessment & Plan Note (Signed)
Stable control with diet.  

## 2015-09-23 ENCOUNTER — Other Ambulatory Visit: Payer: Self-pay | Admitting: Family Medicine

## 2015-10-12 ENCOUNTER — Encounter: Payer: Self-pay | Admitting: Pulmonary Disease

## 2015-10-12 MED ORDER — TIOTROPIUM BROMIDE-OLODATEROL 2.5-2.5 MCG/ACT IN AERS: 2.0000 | INHALATION_SPRAY | Freq: Every day | RESPIRATORY_TRACT | Status: DC

## 2015-10-12 NOTE — Telephone Encounter (Signed)
Spoke with pt via telephone. Rx has been sent in. Nothing further was needed.

## 2015-11-07 ENCOUNTER — Encounter: Payer: Self-pay | Admitting: Family Medicine

## 2015-11-10 NOTE — Telephone Encounter (Signed)
Marion.. Can you give me a list of a few general surgeons in [place] and [place] for this pt. She is considering having a hematoma removed from her leg.

## 2016-01-10 ENCOUNTER — Ambulatory Visit (INDEPENDENT_AMBULATORY_CARE_PROVIDER_SITE_OTHER): Payer: Commercial Managed Care - HMO | Admitting: Internal Medicine

## 2016-01-10 ENCOUNTER — Encounter: Payer: Self-pay | Admitting: Internal Medicine

## 2016-01-10 VITALS — BP 126/78 | HR 72 | Wt 109.0 lb

## 2016-01-10 DIAGNOSIS — J441 Chronic obstructive pulmonary disease with (acute) exacerbation: Secondary | ICD-10-CM

## 2016-01-10 MED ORDER — PREDNISONE 20 MG PO TABS: ORAL_TABLET | ORAL | Status: DC

## 2016-01-10 MED ORDER — PREDNISONE 20 MG PO TABS: 40.0000 mg | ORAL_TABLET | Freq: Every day | ORAL | Status: DC

## 2016-01-10 NOTE — Progress Notes (Signed)
[place] [facility] Consultation     Date: 01/10/2016,   MRN# [id number] DANEKA MCNEAR 03/04/1934 Code Status:  Hosp day:@LENGTHOFSTAYDAYS @ Referring MD: @ATDPROV @     PCP:      AdmissionWeight: 109 lb (49.442 kg)                 CurrentWeight: 109 lb (49.442 kg)  Synopsis: Calesha Ciraolo was first seen by the [facility] pulmonary clinic in the summer of 2013. She has COPD. Simple spirometry performed in clinic showed an FEV1 of 45% predicted with clear obstruction.  She smoked 2 pack/day x 40 years, quit  17 years ago according to patient. She is a frequent exacerbation phenotype. She uses 2 L of oxygen continuously with exertion and at night On chronic azithromycin therapy: therapy for prevention of COPD exacerbations (ie. roflumilast or azithromycin) when appopriate. FLU SHOT up to date 2016      CC follow up COPD HPI  Patient had fall in 03/2015 broke 8 ribs, no PTX,  severe emphysema with bronchiectasis with R plueral based nodule +cough, chest congestion, intermittent wheezing +sick contact 1 week ago Uses oxygen accordingly, no fevers, no chills     MEDICATIONS     Current Medication:   Current outpatient prescriptions:  .  albuterol (ACCUNEB) 1.25 MG/3ML nebulizer solution, Take 3 mLs (1.25 mg total) by nebulization every 6 (six) hours as needed., Disp: 75 mL, Rfl: 5 .  Ascorbic Acid (VITAMIN C PO), Take 1 tablet by mouth daily., Disp: , Rfl:  .  aspirin 81 MG tablet, Take 81 mg by mouth every other day. , Disp: , Rfl:  .  azithromycin (ZITHROMAX) 250 MG tablet, TAKE 1 TABLET EVERY DAY, Disp: 90 tablet, Rfl: 1 .  Calcium Carbonate-Vitamin D (CALCIUM 600/VITAMIN D) 600-400 MG-UNIT per tablet, Take 1 tablet by mouth 2 (two) times daily.  , Disp: , Rfl:  .  losartan (COZAAR) 50 MG tablet, TAKE 1 TABLET EVERY DAY, Disp: 90 tablet, Rfl: 3 .  Multiple Vitamins-Minerals (PRESERVISION AREDS) TABS, Take 1 tablet by mouth 2 (two) times daily., Disp: ,  Rfl:  .  Omega-3 Fatty Acids (FISH OIL) 1000 MG CAPS, Take 1 capsule by mouth daily., Disp: , Rfl:  .  Tiotropium Bromide-Olodaterol (STIOLTO RESPIMAT) 2.5-2.5 MCG/ACT AERS, Inhale 2 puffs into the lungs daily., Disp: 3 Inhaler, Rfl: 1     ALLERGIES   Review of patient's allergies indicates no known allergies.     REVIEW OF SYSTEMS   Review of Systems  Constitutional: Positive for malaise/fatigue. Negative for fever, chills and weight loss.  HENT: Positive for congestion.   Respiratory: Positive for cough, shortness of breath and wheezing. Negative for hemoptysis.   Cardiovascular: Negative for chest pain, palpitations and leg swelling.  Endo/Heme/Allergies: Does not bruise/bleed easily.  All other systems reviewed and are negative.    VS: BP 126/78 mmHg  Pulse 72  Wt 109 lb (49.442 kg)  SpO2 93%     PHYSICAL EXAM   Physical Exam  Constitutional: She is oriented to person, place, and time. No distress.  Eyes: Pupils are equal, round, and reactive to light.  Cardiovascular: Normal rate, regular rhythm and normal heart sounds.   No murmur heard. Pulmonary/Chest: Effort normal and breath sounds normal. No respiratory distress. She has no wheezes. She has no rales.  Neurological: She is alert and oriented to person, place, and time.  Skin: Skin is warm. She is not diaphoretic.  ASSESSMENT/PLAN   82 yo white female with moderate/severe COPD on chronic oxygen therapy with acute viral bronchitis with acute mild COPD exacerbation previous  CT chest shows severe emphysema and Bronchiectasis and RLL pleural based nodule  1.continue stiolto(AC and LABA) 2.continue oxygen as needed-patient states that pulse therapy is NOT sufficient, wants to continuous flow of oxygen therapy -will refer to oxygen compainies for assistance 3.continue azithromycin for COPD exacerbation prevention 4.Exercises daily (40 minutes daily) 5.will prescribe prednisone 40 mg daily for 7  days, advised to use albuterol neb every 4 hrs as needed  Patient DOES NOT WANT repeat CT chest to assess interval changes. SHE DOES NOT want to pursue any further diagnostic testing. She states that "She is happy with her life and happy with the [name] and if its her time to go, she will go happily"   I have personally obtained a history, examined the patient, evaluated laboratory and independently reviewed imaging results, formulated the assessment and plan and placed orders.  The Patient requires high complexity decision making for assessment and support, frequent evaluation and titration of therapies, application of advanced monitoring technologies and extensive interpretation of multiple databases.  Patient satisfied with Plan of action and management. All questions answered  Follow up 1 month   Lakecia Deschamps Patricia Pesa, M.D.  [facility]  Medical Director [place] Director [facility] Cardio-Pulmonary Department

## 2016-01-10 NOTE — Patient Instructions (Signed)
Chronic Obstructive Pulmonary Disease Chronic obstructive pulmonary disease (COPD) is a common lung condition in which airflow from the lungs is limited. COPD is a general term that can be used to describe many different lung problems that limit airflow, including both chronic bronchitis and emphysema. If you have COPD, your lung function will probably never return to normal, but there are measures you can take to improve lung function and make yourself feel better. CAUSES   Smoking (common).  Exposure to secondhand smoke.  Genetic problems.  Chronic inflammatory lung diseases or recurrent infections. SYMPTOMS  Shortness of breath, especially with physical activity.  Deep, persistent (chronic) cough with a large amount of thick mucus.  Wheezing.  Rapid breaths (tachypnea).  Gray or bluish discoloration (cyanosis) of the skin, especially in your fingers, toes, or lips.  Fatigue.  Weight loss.  Frequent infections or episodes when breathing symptoms become much worse (exacerbations).  Chest tightness. DIAGNOSIS Your health care provider will take a medical history and perform a physical examination to diagnose COPD. Additional tests for COPD may include:  Lung (pulmonary) function tests.  Chest X-ray.  CT scan.  Blood tests. TREATMENT  Treatment for COPD may include:  Inhaler and nebulizer medicines. These help manage the symptoms of COPD and make your breathing more comfortable.  Supplemental oxygen. Supplemental oxygen is only helpful if you have a low oxygen level in your blood.  Exercise and physical activity. These are beneficial for nearly all people with COPD.  Lung surgery or transplant.  Nutrition therapy to gain weight, if you are underweight.  Pulmonary rehabilitation. This may involve working with a team of health care providers and specialists, such as respiratory, occupational, and physical therapists. HOME CARE INSTRUCTIONS  Take all medicines  (inhaled or pills) as directed by your health care provider.  Avoid over-the-counter medicines or cough syrups that dry up your airway (such as antihistamines) and slow down the elimination of secretions unless instructed otherwise by your health care provider.  If you are a smoker, the most important thing that you can do is stop smoking. Continuing to smoke will cause further lung damage and breathing trouble. Ask your health care provider for help with quitting smoking. He or she can direct you to community resources or hospitals that provide support.  Avoid exposure to irritants such as smoke, chemicals, and fumes that aggravate your breathing.  Use oxygen therapy and pulmonary rehabilitation if directed by your health care provider. If you require home oxygen therapy, ask your health care provider whether you should purchase a pulse oximeter to measure your oxygen level at home.  Avoid contact with individuals who have a contagious illness.  Avoid extreme temperature and humidity changes.  Eat healthy foods. Eating smaller, more frequent meals and resting before meals may help you maintain your strength.  Stay active, but balance activity with periods of rest. Exercise and physical activity will help you maintain your ability to do things you want to do.  Preventing infection and hospitalization is very important when you have COPD. Make sure to receive all the vaccines your health care provider recommends, especially the pneumococcal and influenza vaccines. Ask your health care provider whether you need a pneumonia vaccine.  Learn and use relaxation techniques to manage stress.  Learn and use controlled breathing techniques as directed by your health care provider. Controlled breathing techniques include:  Pursed lip breathing. Start by breathing in (inhaling) through your nose for 1 second. Then, purse your lips as if you were   going to whistle and breathe out (exhale) through the  pursed lips for 2 seconds.  Diaphragmatic breathing. Start by putting one hand on your abdomen just above your waist. Inhale slowly through your nose. The hand on your abdomen should move out. Then purse your lips and exhale slowly. You should be able to feel the hand on your abdomen moving in as you exhale.  Learn and use controlled coughing to clear mucus from your lungs. Controlled coughing is a series of short, progressive coughs. The steps of controlled coughing are: 1. Lean your head slightly forward. 2. Breathe in deeply using diaphragmatic breathing. 3. Try to hold your breath for 3 seconds. 4. Keep your mouth slightly open while coughing twice. 5. Spit any mucus out into a tissue. 6. Rest and repeat the steps once or twice as needed. SEEK MEDICAL CARE IF:  You are coughing up more mucus than usual.  There is a change in the color or thickness of your mucus.  Your breathing is more labored than usual.  Your breathing is faster than usual. SEEK IMMEDIATE MEDICAL CARE IF:  You have shortness of breath while you are resting.  You have shortness of breath that prevents you from:  Being able to talk.  Performing your usual physical activities.  You have chest pain lasting longer than 5 minutes.  Your skin color is more cyanotic than usual.  You measure low oxygen saturations for longer than 5 minutes with a pulse oximeter. MAKE SURE YOU:  Understand these instructions.  Will watch your condition.  Will get help right away if you are not doing well or get worse.   This information is not intended to replace advice given to you by your health care provider. Make sure you discuss any questions you have with your health care provider.   Document Released: 05/09/2005 Document Revised: 08/20/2014 Document Reviewed: 03/26/2013 Elsevier Interactive Patient Education 2016 Elsevier Inc.  

## 2016-01-23 ENCOUNTER — Ambulatory Visit: Payer: Commercial Managed Care - HMO

## 2016-02-10 ENCOUNTER — Ambulatory Visit (INDEPENDENT_AMBULATORY_CARE_PROVIDER_SITE_OTHER): Payer: Commercial Managed Care - HMO | Admitting: *Deleted

## 2016-02-10 DIAGNOSIS — J449 Chronic obstructive pulmonary disease, unspecified: Secondary | ICD-10-CM

## 2016-02-10 DIAGNOSIS — R0600 Dyspnea, unspecified: Secondary | ICD-10-CM | POA: Diagnosis not present

## 2016-02-10 NOTE — Progress Notes (Signed)
SMW performed today. 

## 2016-02-18 ENCOUNTER — Other Ambulatory Visit: Payer: Self-pay | Admitting: Pulmonary Disease

## 2016-02-20 ENCOUNTER — Telehealth: Payer: Self-pay | Admitting: Internal Medicine

## 2016-02-20 DIAGNOSIS — R0602 Shortness of breath: Secondary | ICD-10-CM | POA: Diagnosis not present

## 2016-02-20 DIAGNOSIS — J449 Chronic obstructive pulmonary disease, unspecified: Secondary | ICD-10-CM | POA: Diagnosis not present

## 2016-02-20 NOTE — Telephone Encounter (Signed)
Order placed

## 2016-02-20 NOTE — Telephone Encounter (Signed)
Sounds good please order

## 2016-02-20 NOTE — Telephone Encounter (Signed)
Pt called and stated that [facility] delivered o2 system today, however, did not include a humidifier bottle.  Pt is requesting an order to be sent to [facility] for a humidifier bottle for her o2. Will forward to Dr. Mortimer Fries to approve. Rhonda J Cobb

## 2016-02-20 NOTE — Telephone Encounter (Signed)
Forward phone note to [facility] to place referral to [facility]. Rhonda J Cobb

## 2016-04-09 ENCOUNTER — Other Ambulatory Visit: Payer: Self-pay | Admitting: Internal Medicine

## 2016-04-18 ENCOUNTER — Encounter: Payer: Self-pay | Admitting: Family Medicine

## 2016-04-18 NOTE — Telephone Encounter (Signed)
Will forward note to Rosaria Ferries and Ebony Hail to do referral for eye exam. [facility])

## 2016-05-04 ENCOUNTER — Other Ambulatory Visit: Payer: Self-pay | Admitting: Pharmacist

## 2016-05-04 NOTE — Patient Outreach (Signed)
Outreach call to [employer] regarding her request for follow up from the [facility] Medication Adherence Campaign. HIPAA identifiers verified and verbal consent received.   Patient reports that she has been taking her losartan as directed for years. Denies any missed doses or any barriers to taking her medications such as cost or side effects.   Patient reports that she has no medication questions or concerns at this time.  Harlow Asa, PharmD Clinical Pharmacist [facility] [phone]

## 2016-05-08 ENCOUNTER — Encounter: Payer: Self-pay | Admitting: Family Medicine

## 2016-05-08 ENCOUNTER — Encounter: Payer: Self-pay | Admitting: *Deleted

## 2016-05-08 NOTE — Telephone Encounter (Signed)
Called pt. Spoke with pt about all the [employer].  She has such well controlled DM.. I am going to change her Diagnosis to  Prediabetes.  So no q 6 month A1C or appt needed. I had thought she was requesting this. Okay to hold off on Tdap as not covered by her insurance.  Please cancel the appt and labs prior.  Keep appt for her flu shot please. She would prefer this to the flu clinic.  Cc: Meghan as Juluis Rainier

## 2016-05-11 ENCOUNTER — Other Ambulatory Visit: Payer: Commercial Managed Care - HMO

## 2016-05-11 ENCOUNTER — Telehealth: Payer: Self-pay | Admitting: Family Medicine

## 2016-05-11 ENCOUNTER — Ambulatory Visit (INDEPENDENT_AMBULATORY_CARE_PROVIDER_SITE_OTHER): Payer: Commercial Managed Care - HMO

## 2016-05-11 DIAGNOSIS — Z23 Encounter for immunization: Secondary | ICD-10-CM | POA: Diagnosis not present

## 2016-05-15 ENCOUNTER — Ambulatory Visit: Payer: Commercial Managed Care - HMO | Admitting: Family Medicine

## 2016-05-17 ENCOUNTER — Ambulatory Visit: Payer: Commercial Managed Care - HMO | Admitting: Family Medicine

## 2016-05-18 NOTE — Telephone Encounter (Signed)
Noted  

## 2016-06-01 DIAGNOSIS — H353221 Exudative age-related macular degeneration, left eye, with active choroidal neovascularization: Secondary | ICD-10-CM | POA: Diagnosis not present

## 2016-06-01 LAB — HM DIABETES EYE EXAM

## 2016-06-07 DIAGNOSIS — H353222 Exudative age-related macular degeneration, left eye, with inactive choroidal neovascularization: Secondary | ICD-10-CM | POA: Diagnosis not present

## 2016-06-25 ENCOUNTER — Encounter: Payer: Self-pay | Admitting: Internal Medicine

## 2016-06-25 ENCOUNTER — Ambulatory Visit (INDEPENDENT_AMBULATORY_CARE_PROVIDER_SITE_OTHER): Payer: Commercial Managed Care - HMO | Admitting: Internal Medicine

## 2016-06-25 VITALS — BP 138/68 | HR 113 | Wt 112.0 lb

## 2016-06-25 DIAGNOSIS — J9611 Chronic respiratory failure with hypoxia: Secondary | ICD-10-CM | POA: Diagnosis not present

## 2016-06-25 NOTE — Patient Instructions (Signed)
Continue inhalers as prescribed Continue azithromycin-advised to watch for cardiac symptoms  Chronic Obstructive Pulmonary Disease Chronic obstructive pulmonary disease (COPD) is a common lung condition in which airflow from the lungs is limited. COPD is a general term that can be used to describe many different lung problems that limit airflow, including both chronic bronchitis and emphysema. If you have COPD, your lung function will probably never return to normal, but there are measures you can take to improve lung function and make yourself feel better. CAUSES   Smoking (common).  Exposure to secondhand smoke.  Genetic problems.  Chronic inflammatory lung diseases or recurrent infections. SYMPTOMS  Shortness of breath, especially with physical activity.  Deep, persistent (chronic) cough with a large amount of thick mucus.  Wheezing.  Rapid breaths (tachypnea).  Gray or bluish discoloration (cyanosis) of the skin, especially in your fingers, toes, or lips.  Fatigue.  Weight loss.  Frequent infections or episodes when breathing symptoms become much worse (exacerbations).  Chest tightness. DIAGNOSIS Your health care provider will take a medical history and perform a physical examination to diagnose COPD. Additional tests for COPD may include:  Lung (pulmonary) function tests.  Chest X-ray.  CT scan.  Blood tests. TREATMENT  Treatment for COPD may include:  Inhaler and nebulizer medicines. These help manage the symptoms of COPD and make your breathing more comfortable.  Supplemental oxygen. Supplemental oxygen is only helpful if you have a low oxygen level in your blood.  Exercise and physical activity. These are beneficial for nearly all people with COPD.  Lung surgery or transplant.  Nutrition therapy to gain weight, if you are underweight.  Pulmonary rehabilitation. This may involve working with a team of health care providers and specialists, such as  respiratory, occupational, and physical therapists. HOME CARE INSTRUCTIONS  Take all medicines (inhaled or pills) as directed by your health care provider.  Avoid over-the-counter medicines or cough syrups that dry up your airway (such as antihistamines) and slow down the elimination of secretions unless instructed otherwise by your health care provider.  If you are a smoker, the most important thing that you can do is stop smoking. Continuing to smoke will cause further lung damage and breathing trouble. Ask your health care provider for help with quitting smoking. He or she can direct you to community resources or hospitals that provide support.  Avoid exposure to irritants such as smoke, chemicals, and fumes that aggravate your breathing.  Use oxygen therapy and pulmonary rehabilitation if directed by your health care provider. If you require home oxygen therapy, ask your health care provider whether you should purchase a pulse oximeter to measure your oxygen level at home.  Avoid contact with individuals who have a contagious illness.  Avoid extreme temperature and humidity changes.  Eat healthy foods. Eating smaller, more frequent meals and resting before meals may help you maintain your strength.  Stay active, but balance activity with periods of rest. Exercise and physical activity will help you maintain your ability to do things you want to do.  Preventing infection and hospitalization is very important when you have COPD. Make sure to receive all the vaccines your health care provider recommends, especially the pneumococcal and influenza vaccines. Ask your health care provider whether you need a pneumonia vaccine.  Learn and use relaxation techniques to manage stress.  Learn and use controlled breathing techniques as directed by your health care provider. Controlled breathing techniques include:  Pursed lip breathing. Start by breathing in (inhaling) through your  nose for 1  second. Then, purse your lips as if you were going to whistle and breathe out (exhale) through the pursed lips for 2 seconds.  Diaphragmatic breathing. Start by putting one hand on your abdomen just above your waist. Inhale slowly through your nose. The hand on your abdomen should move out. Then purse your lips and exhale slowly. You should be able to feel the hand on your abdomen moving in as you exhale.  Learn and use controlled coughing to clear mucus from your lungs. Controlled coughing is a series of short, progressive coughs. The steps of controlled coughing are: 1. Lean your head slightly forward. 2. Breathe in deeply using diaphragmatic breathing. 3. Try to hold your breath for 3 seconds. 4. Keep your mouth slightly open while coughing twice. 5. Spit any mucus out into a tissue. 6. Rest and repeat the steps once or twice as needed. SEEK MEDICAL CARE IF:  You are coughing up more mucus than usual.  There is a change in the color or thickness of your mucus.  Your breathing is more labored than usual.  Your breathing is faster than usual. SEEK IMMEDIATE MEDICAL CARE IF:  You have shortness of breath while you are resting.  You have shortness of breath that prevents you from:  Being able to talk.  Performing your usual physical activities.  You have chest pain lasting longer than 5 minutes.  Your skin color is more cyanotic than usual.  You measure low oxygen saturations for longer than 5 minutes with a pulse oximeter. MAKE SURE YOU:  Understand these instructions.  Will watch your condition.  Will get help right away if you are not doing well or get worse.   This information is not intended to replace advice given to you by your health care provider. Make sure you discuss any questions you have with your health care provider.   Document Released: 05/09/2005 Document Revised: 08/20/2014 Document Reviewed: 03/26/2013 Elsevier Interactive Patient Education [employer]

## 2016-06-25 NOTE — Progress Notes (Signed)
Misty Ferguson     Date: 06/25/2016,   MRN# [id number] Misty Ferguson [birth year] Code Status:  Hosp day:@LENGTHOFSTAYDAYS @ Referring MD: @ATDPROV @     PCP:      Admission                  Current   Synopsis: Misty Ferguson was first seen by the [facility] pulmonary clinic in the summer of 2013. She has COPD. Simple spirometry performed in clinic showed an FEV1 of 45% predicted with clear obstruction.  She smoked 2 pack/day x 40 years, quit  17 years ago according to patient. She is a frequent exacerbation phenotype. She uses 2 L of oxygen continuously with exertion and at night On chronic azithromycin therapy: therapy for prevention of COPD exacerbations (ie. roflumilast or azithromycin) when appopriate. FLU SHOT up to date 2016      CC follow up COPD HPI  No acute issues, has chronic SOB and DOE Uses oxygen severe emphysema with bronchiectasis with R pleural based nodule Uses oxygen accordingly, no fevers, no chills No signs of infection at this time Different note-RT leg injury-scabbed and bruised No pain, no swelling, mild erythema     MEDICATIONS     Current Medication:   Current Outpatient Prescriptions:  .  albuterol (ACCUNEB) 1.25 MG/3ML nebulizer solution, Take 3 mLs (1.25 mg total) by nebulization every 6 (six) hours as needed., Disp: 75 mL, Rfl: 5 .  Ascorbic Acid (VITAMIN C PO), Take 1 tablet by mouth daily., Disp: , Rfl:  .  aspirin 81 MG tablet, Take 81 mg by mouth every other day. , Disp: , Rfl:  .  azithromycin (ZITHROMAX) 250 MG tablet, TAKE 1 TABLET EVERY DAY, Disp: 90 tablet, Rfl: 1 .  Calcium Carbonate-Vitamin D (CALCIUM 600/VITAMIN D) 600-400 MG-UNIT per tablet, Take 1 tablet by mouth 2 (two) times daily.  , Disp: , Rfl:  .  losartan (COZAAR) 50 MG tablet, TAKE 1 TABLET EVERY DAY, Disp: 90 tablet, Rfl: 3 .  Multiple Vitamins-Minerals (PRESERVISION AREDS) TABS, Take 1 tablet by mouth 2 (two) times daily., Disp:  , Rfl:  .  Omega-3 Fatty Acids (FISH OIL) 1000 MG CAPS, Take 1 capsule by mouth daily., Disp: , Rfl:  .  predniSONE (DELTASONE) 20 MG tablet, Take 2 tablets (40 mg total) by mouth daily with breakfast. For 7 days, Disp: 14 tablet, Rfl: 0 .  STIOLTO RESPIMAT 2.5-2.5 MCG/ACT AERS, INHALE 2 PUFFS INTO THE LUNGS EVERY DAY, Disp: 12 g, Rfl: 1     ALLERGIES   Patient has no known allergies.     REVIEW OF SYSTEMS   Review of Systems  Constitutional: Positive for malaise/fatigue. Negative for chills, fever and weight loss.  HENT: Positive for congestion.   Respiratory: Positive for cough, shortness of breath and wheezing. Negative for hemoptysis.   Cardiovascular: Negative for chest pain, palpitations and leg swelling.  Endo/Heme/Allergies: Does not bruise/bleed easily.  All other systems reviewed and are negative.    BP 138/68 (BP Location: Left Arm, Cuff Size: Normal)   Pulse (!) 113   Wt 112 lb (50.8 kg)   SpO2 92%   BMI 21.16 kg/m    PHYSICAL EXAM   Physical Exam  Constitutional: She is oriented to person, place, and time. No distress.  Eyes: Pupils are equal, round, and reactive to light.  Cardiovascular: Normal rate, regular rhythm and normal heart sounds.   No murmur heard. Pulmonary/Chest: Effort normal and breath sounds normal. No respiratory  distress. She has no wheezes. She has no rales.  Musculoskeletal:  RT leg scabbing from injury, mild swellling and erythema bruising  Neurological: She is alert and oriented to person, place, and time.  Skin: Skin is warm. She is not diaphoretic.         ASSESSMENT/PLAN   82 yo white female with Gold Stage D moderate/severe COPD on chronic oxygen therapy with chronic hypoxic resp failure  previous  CT chest shows severe emphysema and Bronchiectasis and RLL pleural based nodule  1.continue stiolto(AC and LABA) 2.continue oxygen as needed 3.continue azithromycin for COPD exacerbation prevention 4.Exercises daily (40  minutes daily) 5.RT leg injury-use OTC ointment, see PCP if worsens   Patient DOES NOT WANT repeat CT chest to assess interval changes. SHE DOES NOT want to pursue any further diagnostic testing. She states that "She is happy with her life and happy with the [name] and if its her time to go, she will go happily"   I have personally obtained a history, examined the patient, evaluated laboratory and independently reviewed imaging results, formulated the assessment and plan and placed orders.  The Patient requires high complexity decision making for assessment and support, frequent evaluation and titration of therapies, application of advanced monitoring technologies and extensive interpretation of multiple databases.  Patient satisfied with Plan of action and management. All questions answered  Follow up 6 months   Misty Ferguson, M.D.  [facility]  Medical Director [place] Director [facility] Cardio-Pulmonary Department

## 2016-07-04 DIAGNOSIS — H353221 Exudative age-related macular degeneration, left eye, with active choroidal neovascularization: Secondary | ICD-10-CM | POA: Diagnosis not present

## 2016-07-11 ENCOUNTER — Other Ambulatory Visit: Payer: Self-pay | Admitting: Internal Medicine

## 2016-07-24 ENCOUNTER — Encounter: Payer: Self-pay | Admitting: Family Medicine

## 2016-07-24 ENCOUNTER — Ambulatory Visit (INDEPENDENT_AMBULATORY_CARE_PROVIDER_SITE_OTHER): Payer: Commercial Managed Care - HMO | Admitting: Family Medicine

## 2016-07-24 DIAGNOSIS — T148XXA Other injury of unspecified body region, initial encounter: Secondary | ICD-10-CM | POA: Diagnosis not present

## 2016-07-24 DIAGNOSIS — G609 Hereditary and idiopathic neuropathy, unspecified: Secondary | ICD-10-CM | POA: Diagnosis not present

## 2016-07-24 DIAGNOSIS — G629 Polyneuropathy, unspecified: Secondary | ICD-10-CM | POA: Insufficient documentation

## 2016-07-24 MED ORDER — GABAPENTIN 100 MG PO CAPS: 100.0000 mg | ORAL_CAPSULE | Freq: Three times a day (TID) | ORAL | 3 refills | Status: DC

## 2016-07-24 MED ORDER — SILVER SULFADIAZINE 1 % EX CREA: 1.0000 "application " | TOPICAL_CREAM | Freq: Every day | CUTANEOUS | 0 refills | Status: DC

## 2016-07-24 NOTE — Assessment & Plan Note (Addendum)
She does not want to decrease  ETOH or check B12 today.  Will start trial of gabapentin at night. Given daytime symptom she will consider  titrating up to TID for best control.

## 2016-07-24 NOTE — Progress Notes (Signed)
   Subjective:    Patient ID: Misty Ferguson, female    DOB: 07/21/1934, 82 y.o.   MRN: [id number]  HPI    82 year old female presents with new onset   insufficiencyon legs after injury right lower leg with TV tray 9 weeks ago.  She has been treating with Aquaphor, keeping dry. Occ using biotine, hydrogen peroxide.  She has been trying to dry out, keep uncovered.  Followed by Pulm Dr. Mortimer Fries No changes made. 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   She has venous  Insufficiency.    She has burning pain in feet, numb, cold. Wakes her up at night. She is interested in treating peripheral neuropathy.  Drinks 2 glasses daily ETOH.  Prediabetes.  Review of Systems  Constitutional: Negative for fatigue and fever.  HENT: Negative for ear pain.   Eyes: Negative for pain.  Respiratory: Negative for chest tightness and shortness of breath.   Cardiovascular: Negative for chest pain, palpitations and leg swelling.  Gastrointestinal: Negative for abdominal pain.  Genitourinary: Negative for dysuria.       Objective:   Physical Exam  Constitutional: Vital signs are normal. She appears well-developed and well-nourished. She is cooperative.  Non-toxic appearance. She does not appear ill. No distress.  HENT:  Head: Normocephalic.  Right Ear: Hearing, tympanic membrane, external ear and ear canal normal. Tympanic membrane is not erythematous, not retracted and not bulging.  Left Ear: Hearing, tympanic membrane, external ear and ear canal normal. Tympanic membrane is not erythematous, not retracted and not bulging.  Nose: No mucosal edema or rhinorrhea. Right sinus exhibits no maxillary sinus tenderness and no frontal sinus tenderness. Left sinus exhibits no maxillary sinus tenderness and no frontal sinus tenderness.  Mouth/Throat: Uvula is midline, oropharynx is clear and moist and mucous membranes are normal.  Eyes: Conjunctivae, EOM and lids are normal. Pupils are equal, round, and reactive  to light. Lids are everted and swept, no foreign bodies found.  Neck: Trachea normal and normal range of motion. Neck supple. Carotid bruit is not present. No thyroid mass and no thyromegaly present.  Cardiovascular: Normal rate, regular rhythm, S1 normal, S2 normal, normal heart sounds, intact distal pulses and normal pulses.  Exam reveals no gallop and no friction rub.   No murmur heard. Pulmonary/Chest: Effort normal and breath sounds normal. No tachypnea. No respiratory distress. She has no decreased breath sounds. She has no wheezes. She has no rhonchi. She has no rales.  Abdominal: Soft. Normal appearance and bowel sounds are normal. There is no tenderness.  Musculoskeletal:       Lumbar back: Normal. She exhibits normal range of motion, no tenderness and no bony tenderness.  Neurological: She is alert.   Decrease sensation to touch in feet  Skin: Skin is warm, dry and intact.      See description of wounds below  Psychiatric: Her speech is normal and behavior is normal. Judgment and thought content normal. Her mood appears not anxious. Cognition and memory are normal. She does not exhibit a depressed mood.      3 cm x 3 cm lesion on right anterior lower leg ( depth 2-3 mm), 2 cm x 3 cm lesion above larger lesion.     Assessment & Plan:

## 2016-07-24 NOTE — Patient Instructions (Addendum)
Elevated leg when sitting.  Apply cream and  Change bandage daily.  Stop any other topicals to avoid slowing healing.  Call if redness is spreading, fever, pain increasing, smelly discharge.  Start trial of gabapentin.. 100 mg at bedtime.. If tolerate gradually increase weekly by 100 mg to a total of 300 mg per day.

## 2016-07-24 NOTE — Assessment & Plan Note (Signed)
Treat with silvadene cream and bandage. Stop hydrogen peroxide and other treatments that may slow healing.  No clear ongoing wound infection.  Slight swelling.. Elevate foot to decrease.

## 2016-07-24 NOTE — Progress Notes (Signed)
Pre visit review using our clinic review tool, if applicable. No additional management support is needed unless otherwise documented below in the visit note. 

## 2016-08-01 DIAGNOSIS — H353221 Exudative age-related macular degeneration, left eye, with active choroidal neovascularization: Secondary | ICD-10-CM | POA: Diagnosis not present

## 2016-08-09 ENCOUNTER — Encounter: Payer: Self-pay | Admitting: Family Medicine

## 2016-08-13 ENCOUNTER — Other Ambulatory Visit: Payer: Self-pay | Admitting: Internal Medicine

## 2016-09-05 DIAGNOSIS — H353221 Exudative age-related macular degeneration, left eye, with active choroidal neovascularization: Secondary | ICD-10-CM | POA: Diagnosis not present

## 2016-09-21 ENCOUNTER — Telehealth: Payer: Self-pay | Admitting: Family Medicine

## 2016-09-21 DIAGNOSIS — E7800 Pure hypercholesterolemia, unspecified: Secondary | ICD-10-CM

## 2016-09-21 DIAGNOSIS — R7303 Prediabetes: Secondary | ICD-10-CM

## 2016-09-21 DIAGNOSIS — M810 Age-related osteoporosis without current pathological fracture: Secondary | ICD-10-CM

## 2016-09-21 NOTE — Telephone Encounter (Signed)
-----   Message from Ellamae Sia sent at 09/20/2016  5:04 PM EST ----- Regarding: Lab orders for 2.15.18  AWV lab orders, please.

## 2016-09-27 ENCOUNTER — Other Ambulatory Visit (INDEPENDENT_AMBULATORY_CARE_PROVIDER_SITE_OTHER): Payer: Medicare HMO

## 2016-09-27 DIAGNOSIS — R7303 Prediabetes: Secondary | ICD-10-CM

## 2016-09-27 DIAGNOSIS — E7800 Pure hypercholesterolemia, unspecified: Secondary | ICD-10-CM | POA: Diagnosis not present

## 2016-09-27 DIAGNOSIS — M810 Age-related osteoporosis without current pathological fracture: Secondary | ICD-10-CM

## 2016-09-27 LAB — LIPID PANEL
CHOLESTEROL: 282 mg/dL — AB (ref 0–200)
HDL: 165.4 mg/dL (ref >39.00)
LDL CALC: 107 mg/dL — AB (ref 0–99)
NonHDL: 116.54
Total CHOL/HDL Ratio: 2
Triglycerides: 48 mg/dL (ref 0.0–149.0)
VLDL: 9.6 mg/dL (ref 0.0–40.0)

## 2016-09-27 LAB — COMPREHENSIVE METABOLIC PANEL
ALT: 19 U/L (ref 0–35)
AST: 25 U/L (ref 0–37)
Albumin: 4.4 g/dL (ref 3.5–5.2)
Alkaline Phosphatase: 54 U/L (ref 39–117)
BUN: 22 mg/dL (ref 6–23)
CALCIUM: 9.6 mg/dL (ref 8.4–10.5)
CHLORIDE: 106 meq/L (ref 96–112)
CO2: 28 meq/L (ref 19–32)
CREATININE: 1.01 mg/dL (ref 0.40–1.20)
GFR: 56.01 mL/min — ABNORMAL LOW (ref >60.00)
Glucose, Bld: 99 mg/dL (ref 70–99)
Potassium: 4.6 mEq/L (ref 3.5–5.1)
SODIUM: 141 meq/L (ref 135–145)
Total Bilirubin: 0.4 mg/dL (ref 0.2–1.2)
Total Protein: 6.6 g/dL (ref 6.0–8.3)

## 2016-09-27 LAB — HEMOGLOBIN A1C: Hgb A1c MFr Bld: 5.9 % (ref 4.6–6.5)

## 2016-09-27 LAB — VITAMIN D 25 HYDROXY (VIT D DEFICIENCY, FRACTURES): VITD: 35.98 ng/mL (ref 30.00–100.00)

## 2016-09-30 ENCOUNTER — Other Ambulatory Visit: Payer: Self-pay | Admitting: Family Medicine

## 2016-10-04 ENCOUNTER — Ambulatory Visit (INDEPENDENT_AMBULATORY_CARE_PROVIDER_SITE_OTHER): Payer: Medicare HMO

## 2016-10-04 ENCOUNTER — Encounter: Payer: Self-pay | Admitting: Family Medicine

## 2016-10-04 ENCOUNTER — Ambulatory Visit (INDEPENDENT_AMBULATORY_CARE_PROVIDER_SITE_OTHER): Payer: Medicare HMO | Admitting: Family Medicine

## 2016-10-04 VITALS — BP 130/88 | HR 89 | Temp 97.7°F | Ht 61.0 in | Wt 110.2 lb

## 2016-10-04 DIAGNOSIS — M810 Age-related osteoporosis without current pathological fracture: Secondary | ICD-10-CM

## 2016-10-04 DIAGNOSIS — J9611 Chronic respiratory failure with hypoxia: Secondary | ICD-10-CM | POA: Diagnosis not present

## 2016-10-04 DIAGNOSIS — E7800 Pure hypercholesterolemia, unspecified: Secondary | ICD-10-CM | POA: Diagnosis not present

## 2016-10-04 DIAGNOSIS — G609 Hereditary and idiopathic neuropathy, unspecified: Secondary | ICD-10-CM

## 2016-10-04 DIAGNOSIS — R7303 Prediabetes: Secondary | ICD-10-CM | POA: Diagnosis not present

## 2016-10-04 DIAGNOSIS — I10 Essential (primary) hypertension: Secondary | ICD-10-CM | POA: Diagnosis not present

## 2016-10-04 DIAGNOSIS — Z0000 Encounter for general adult medical examination without abnormal findings: Secondary | ICD-10-CM | POA: Diagnosis not present

## 2016-10-04 DIAGNOSIS — Z23 Encounter for immunization: Secondary | ICD-10-CM

## 2016-10-04 DIAGNOSIS — D692 Other nonthrombocytopenic purpura: Secondary | ICD-10-CM | POA: Diagnosis not present

## 2016-10-04 DIAGNOSIS — J432 Centrilobular emphysema: Secondary | ICD-10-CM

## 2016-10-04 NOTE — Progress Notes (Signed)
PCP notes:   Health maintenance:  Bone density - per pt, completed in Oct 2017 Tetanus - addressed Foot exam - will be completed by PCP at next appt  Abnormal screenings:   Hearing - failed Fall risk - hx of single fall without injury  Patient concerns:   Patient is concerned about cardiac health.   Nurse concerns:  None  Next PCP appt:   10/04/16 @ 1400

## 2016-10-04 NOTE — Assessment & Plan Note (Signed)
Followed by Dr. Mortimer Fries for pulmonary. Last OV 06/2016  Reviewed. On daily antibiotics, stiolto and prn and nighttime oxygen.

## 2016-10-04 NOTE — Assessment & Plan Note (Signed)
On 2L [place] with exertion.

## 2016-10-04 NOTE — Patient Instructions (Signed)
Misty Ferguson , Thank you for taking time to come for your Medicare Wellness Visit. I appreciate your ongoing commitment to your health goals. Please review the following plan we discussed and let me know if I can assist you in the future.   These are the goals we discussed: Goals    . Increase physical activity          Starting 10/04/2016, I will continue to exercise at least 40 min daily.        This is a list of the screening recommended for you and due dates:  Health Maintenance  Topic Date Due  . Complete foot exam   10/04/2016*  . Tetanus Vaccine  05/08/2017*  . Hemoglobin A1C  03/27/2017  . Eye exam for diabetics  06/01/2017  . Flu Shot  Completed  . DEXA scan (bone density measurement)  Addressed  . Pneumonia vaccines  Completed  *Topic was postponed. The date shown is not the original due date.   Preventive Care for Adults  A healthy lifestyle and preventive care can promote health and wellness. Preventive health guidelines for adults include the following key practices.  . A routine yearly physical is a good way to check with your health care provider about your health and preventive screening. It is a chance to share any concerns and updates on your health and to receive a thorough exam.  . Visit your dentist for a routine exam and preventive care every 6 months. Brush your teeth twice a day and floss once a day. Good oral hygiene prevents tooth decay and gum disease.  . The frequency of eye exams is based on your age, health, family medical history, use  of contact lenses, and other factors. Follow your health care provider's ecommendations for frequency of eye exams.  . Eat a healthy diet. Foods like vegetables, fruits, whole grains, low-fat dairy products, and lean protein foods contain the nutrients you need without too many calories. Decrease your intake of foods high in solid fats, added sugars, and salt. Eat the right amount of calories for you. Get information about  a proper diet from your health care provider, if necessary.  . Regular physical exercise is one of the most important things you can do for your health. Most adults should get at least 150 minutes of moderate-intensity exercise (any activity that increases your heart rate and causes you to sweat) each week. In addition, most adults need muscle-strengthening exercises on 2 or more days a week.  Silver Sneakers may be a benefit available to you. To determine eligibility, you may visit the website: www.silversneakers.com or contact program at 331-842-9323 Mon-Fri between 8AM-8PM.   . Maintain a healthy weight. The body mass index (BMI) is a screening tool to identify possible weight problems. It provides an estimate of body fat based on height and weight. Your health care provider can find your BMI and can help you achieve or maintain a healthy weight.   For adults 20 years and older: ? A BMI below 18.5 is considered underweight. ? A BMI of 18.5 to 24.9 is normal. ? A BMI of 25 to 29.9 is considered overweight. ? A BMI of 30 and above is considered obese.   . Maintain normal blood lipids and cholesterol levels by exercising and minimizing your intake of saturated fat. Eat a balanced diet with plenty of fruit and vegetables. Blood tests for lipids and cholesterol should begin at age 3 and be repeated every 5 years. If your  lipid or cholesterol levels are high, you are over 50, or you are at high risk for heart disease, you may need your cholesterol levels checked more frequently. Ongoing high lipid and cholesterol levels should be treated with medicines if diet and exercise are not working.  . If you smoke, find out from your health care provider how to quit. If you do not use tobacco, please do not start.  . If you choose to drink alcohol, please do not consume more than 2 drinks per day. One drink is considered to be 12 ounces (355 mL) of beer, 5 ounces (148 mL) of wine, or 1.5 ounces (44 mL) of  liquor.  . If you are 73-87 years old, ask your health care provider if you should take aspirin to prevent strokes.  . Use sunscreen. Apply sunscreen liberally and repeatedly throughout the day. You should seek shade when your shadow is shorter than you. Protect yourself by wearing long sleeves, pants, a wide-brimmed hat, and sunglasses year round, whenever you are outdoors.  . Once a month, do a whole body skin exam, using a mirror to look at the skin on your back. Tell your health care provider of new moles, moles that have irregular borders, moles that are larger than a pencil eraser, or moles that have changed in shape or color.

## 2016-10-04 NOTE — Assessment & Plan Note (Signed)
Well controlled. Continue current medication.  

## 2016-10-04 NOTE — Assessment & Plan Note (Signed)
Stable control on gabapentin.  

## 2016-10-04 NOTE — Progress Notes (Signed)
Subjective:    Patient ID: Misty Ferguson, female    DOB: May 19, 1934, 83 y.o.   MRN: [id number]  HPI  The patient saw Candis Musa, LPN for medicare wellness earlier today. Note reviewed in detail.   COPD with emphysema: Followed by pulmonary ( Dr.  Mortimer Fries) on daily antibiotics, stiolto and prn and nighttime oxygen.  Hypertension:   good control on losartan BP Readings from Last 3 Encounters:  10/04/16 130/88  10/04/16 130/88  07/24/16 (!) 150/82  Using medication without problems or lightheadedness: none Chest pain with exertion: none Edema: none Short of breath: yes Average home BPs: stable at home. occ palpitations. Other issues:   Elevated Cholesterol:  She is high risk for VCVD in next 10 years but is interested in statin. Lab Results  Component Value Date   CHOL 282 (H) 09/27/2016   HDL 165.40 09/27/2016   LDLCALC 107 (H) 09/27/2016   LDLDIRECT 99.1 09/08/2013   TRIG 48.0 09/27/2016   CHOLHDL 2 09/27/2016  Using medications without problems: Muscle aches:  Diet compliance: Good  Exercise: treadmill  Daily. Other complaints:  Prediabetes:  Lab Results  Component Value Date   HGBA1C 5.9 09/27/2016     peripheral neuropathy: stable.  Social History /Family History/Past Medical History reviewed and updated if needed. Blood pressure 130/88, pulse 89, temperature 97.7 F (36.5 C), temperature source Oral, height 5\' 1"  (1.549 m), weight 110 lb 4 oz (50 kg), SpO2 96 %.   Review of Systems  Constitutional: Negative for fatigue and fever.  HENT: Negative for congestion.   Eyes: Negative for pain.  Respiratory: Positive for shortness of breath. Negative for cough.   Cardiovascular: Negative for chest pain, palpitations and leg swelling.  Gastrointestinal: Negative for abdominal pain.  Genitourinary: Negative for dysuria and vaginal bleeding.  Musculoskeletal: Positive for back pain.  Neurological: Negative for syncope, light-headedness and headaches.    Psychiatric/Behavioral: Negative for dysphoric mood.       Objective:   Physical Exam  Constitutional: Vital signs are normal. She appears well-developed and well-nourished. She is cooperative.  Non-toxic appearance. She does not appear ill. No distress.  HENT:  Head: Normocephalic.  Right Ear: Hearing, tympanic membrane, external ear and ear canal normal. Tympanic membrane is not erythematous, not retracted and not bulging.  Left Ear: Hearing, tympanic membrane, external ear and ear canal normal. Tympanic membrane is not erythematous, not retracted and not bulging.  Nose: Nose normal. No mucosal edema or rhinorrhea. Right sinus exhibits no maxillary sinus tenderness and no frontal sinus tenderness. Left sinus exhibits no maxillary sinus tenderness and no frontal sinus tenderness.  Mouth/Throat: Uvula is midline, oropharynx is clear and moist and mucous membranes are normal.  Eyes: Conjunctivae, EOM and lids are normal. Pupils are equal, round, and reactive to light. Lids are everted and swept, no foreign bodies found.  Neck: Trachea normal and normal range of motion. Neck supple. Carotid bruit is not present. No thyroid mass and no thyromegaly present.  Cardiovascular: Normal rate, regular rhythm, S1 normal, S2 normal, normal heart sounds, intact distal pulses and normal pulses.  Exam reveals no gallop and no friction rub.   No murmur heard. Pulmonary/Chest: Effort normal. No tachypnea. No respiratory distress. She has decreased breath sounds. She has no wheezes. She has no rhonchi. She has no rales.  Abdominal: Soft. Normal appearance and bowel sounds are normal. She exhibits no distension, no fluid wave, no abdominal bruit and no mass. There is no hepatosplenomegaly. There is no tenderness.  There is no rebound, no guarding and no CVA tenderness. No hernia.  Musculoskeletal:       Lumbar back: Normal. She exhibits normal range of motion, no tenderness and no bony tenderness.   Lymphadenopathy:    She has no cervical adenopathy.    She has no axillary adenopathy.  Neurological: She is alert. She has normal strength. No cranial nerve deficit or sensory deficit.   Decrease sensation to touch in feet  Skin: Skin is warm, dry and intact. No rash noted.     Senile purpura  Psychiatric: Her speech is normal and behavior is normal. Judgment and thought content normal. Her mood appears not anxious. Cognition and memory are normal. She does not exhibit a depressed mood.          Assessment & Plan:  The patient's preventative maintenance and recommended screening tests for an annual wellness exam were reviewed in full today. Brought up to date unless services declined.  Counselled on the importance of diet, exercise, and its role in overall health and mortality. The patient's FH and SH was reviewed, including their home life, tobacco status, and drug and alcohol status.    The below information was copied from 09/22/2015, but was reviewed again today 10/04/2016 wiuth the patient and she has not changed her mind on any of the issues except as updated below. Vaccines:Uptodate Mammogram: last in 2009, not interested in continuing to follow.  DEXA: on fosamax (many years about 10 years) last checked 2009 t -3.11.Marland Kitchen Stopped fosamax 2012,  05/2016 femoral neck  -2.7  Plan recheck in 2 years.. If worsening will restart fosamax. Colon: Not interested in colon cancer screening. Discussed the risks and benefits of cancer screening... Pt voices understanding an chooses to not proceed with prevention as noted above.  " I am ready to go if the lord takes me" No indication for pap/DVE.

## 2016-10-04 NOTE — Progress Notes (Signed)
Subjective:   Misty Ferguson is a 83 y.o. female who presents for Medicare Annual (Subsequent) preventive examination.  Review of Systems:  N/A Cardiac Risk Factors include: advanced age (>56men, >53 women);diabetes mellitus;dyslipidemia;hypertension     Objective:     Vitals: BP 130/88 (BP Location: Right Arm, Patient Position: Sitting, Cuff Size: Normal)   Pulse 89   Temp 97.7 F (36.5 C) (Oral)   Ht 5\' 1"  (1.549 m) Comment: no shoes  Wt 110 lb 4 oz (50 kg)   SpO2 96% Comment: room air  BMI 20.83 kg/m   Body mass index is 20.83 kg/m.   Tobacco History  Smoking Status  . Former Smoker  . Packs/day: 1.30  . Years: 45.00  . Types: Cigarettes  . Quit date: 08/14/2003  Smokeless Tobacco  . Never Used     Counseling given: No   Past Medical History:  Diagnosis Date  . Chronic airway obstruction, not elsewhere classified   . Macular degeneration (senile) of retina, unspecified   . Obstructive chronic bronchitis with exacerbation ([place])   . Osteoporosis, unspecified   . Personal history of peptic ulcer disease   . Tobacco use disorder   . Type II or unspecified type diabetes mellitus without mention of complication, not stated as uncontrolled   . Unspecified disorder resulting from impaired renal function   . Unspecified essential hypertension   . Unspecified glaucoma(365.9)    History reviewed. No pertinent surgical history. History reviewed. No pertinent family history. History  Sexual Activity  . Sexual activity: Yes    Outpatient Encounter Prescriptions as of 10/04/2016  Medication Sig  . albuterol (ACCUNEB) 1.25 MG/3ML nebulizer solution Take 3 mLs (1.25 mg total) by nebulization every 6 (six) hours as needed.  . Ascorbic Acid (VITAMIN C PO) Take 1 tablet by mouth daily.  Marland Kitchen aspirin 81 MG tablet Take 81 mg by mouth every other day.   Marland Kitchen azithromycin (ZITHROMAX) 250 MG tablet TAKE 1 TABLET EVERY DAY  . Calcium Carbonate-Vitamin D (CALCIUM 600/VITAMIN D)  600-400 MG-UNIT per tablet Take 1 tablet by mouth 2 (two) times daily.    Marland Kitchen gabapentin (NEURONTIN) 100 MG capsule Take 1 capsule (100 mg total) by mouth 3 (three) times daily. Start 100 mg daily and titrate up weekly.  Marland Kitchen losartan (COZAAR) 50 MG tablet TAKE 1 TABLET EVERY DAY  . Multiple Vitamins-Minerals (PRESERVISION AREDS) TABS Take 1 tablet by mouth 2 (two) times daily.  . Omega-3 Fatty Acids (FISH OIL) 1000 MG CAPS Take 1 capsule by mouth daily.  Marland Kitchen STIOLTO RESPIMAT 2.5-2.5 MCG/ACT AERS INHALE 2 PUFFS INTO THE LUNGS EVERY DAY  . [DISCONTINUED] silver sulfADIAZINE (SILVADENE) 1 % cream Apply 1 application topically daily.   No facility-administered encounter medications on file as of 10/04/2016.     Activities of Daily Living In your present state of health, do you have any difficulty performing the following activities: 10/04/2016  Hearing? Y  Vision? Y  Difficulty concentrating or making decisions? Y  Walking or climbing stairs? Y  Dressing or bathing? N  Doing errands, shopping? N  Preparing Food and eating ? N  Using the Toilet? N  In the past six months, have you accidently leaked urine? N  Do you have problems with loss of bowel control? N  Managing your Medications? N  Managing your Finances? N  Housekeeping or managing your Housekeeping? N  Some recent data might be hidden    Patient Care Team: [name], MD as PCP -  General Flora Lipps, MD as Consulting Physician (Pulmonary Disease) Birder Robson, MD as Referring Physician (Ophthalmology)    Assessment:     Hearing Screening   125Hz  250Hz  500Hz  1000Hz  2000Hz  3000Hz  4000Hz  6000Hz  8000Hz   Right ear:   40 40 40  0    Left ear:   40 40 40  0    Vision Screening Comments: Last vision exam in Jan 2018 with Dr.Porfolio   Exercise Activities and Dietary recommendations Current Exercise Habits: Home exercise routine, Type of exercise: treadmill, Time (Minutes): 40, Frequency (Times/Week): 6, Weekly Exercise  (Minutes/Week): 240, Intensity: Mild, Exercise limited by: None identified  Goals    . Increase physical activity          Starting 10/04/2016, I will continue to exercise at least 40 min daily.       Fall Risk Fall Risk  10/04/2016 09/22/2015 09/21/2014 09/18/2013  Falls in the past year? Yes Yes No No  Number falls in past yr: 1 1 - -  Injury with Fall? No Yes - -   Depression Screen PHQ 2/9 Scores 10/04/2016 09/22/2015 09/21/2014 09/18/2013  PHQ - 2 Score 0 0 0 0     Cognitive Function MMSE - Mini Mental State Exam 10/04/2016  Orientation to time 5  Orientation to Place 5  Registration 3  Attention/ Calculation 0  Recall 3  Language- name 2 objects 0  Language- repeat 1  Language- follow 3 step command 3  Language- read & follow direction 0  Write a sentence 0  Copy design 0  Total score 20     PLEASE NOTE: A Mini-Cog screen was completed. Maximum score is 20. A value of 0 denotes this part of Folstein MMSE was not completed or the patient failed this part of the Mini-Cog screening.   Mini-Cog Screening Orientation to Time - Max 5 pts Orientation to Place - Max 5 pts Registration - Max 3 pts Recall - Max 3 pts Language Repeat - Max 1 pts Language Follow 3 Step Command - Max 3 pts     Immunization History  Administered Date(s) Administered  . H1N1 10/04/2008  . Influenza Split 04/21/2011, 05/12/2012  . Influenza Whole 05/27/2007, 05/08/2008, 05/04/2009, 04/26/2010  . Influenza,inj,Quad PF,36+ Mos 05/06/2013, 05/06/2014, 04/21/2015, 05/11/2016  . Pneumococcal Conjugate-13 09/21/2014  . Pneumococcal Polysaccharide-23 03/13/2006, 06/28/2011  . Td 03/13/2006  . Zoster 09/05/2010   Screening Tests Health Maintenance  Topic Date Due  . FOOT EXAM  10/04/2016 (Originally 09/21/2016)  . TETANUS/TDAP  05/08/2017 (Originally 03/13/2016)  . HEMOGLOBIN A1C  03/27/2017  . OPHTHALMOLOGY EXAM  06/01/2017  . INFLUENZA VACCINE  Completed  . DEXA SCAN  Addressed  . PNA vac Low Risk  Adult  Completed      Plan:     I have personally reviewed and addressed the Medicare Annual Wellness questionnaire and have noted the following in the patient's chart:  A. Medical and social history B. Use of alcohol, tobacco or illicit drugs  C. Current medications and supplements D. Functional ability and status E.  Nutritional status F.  Physical activity G. Advance directives H. List of other physicians I.  Hospitalizations, surgeries, and ER visits in previous 12 months J.  [place] to include hearing, vision, cognitive, depression L. Referrals and appointments - none  In addition, I have reviewed and discussed with patient certain preventive protocols, quality metrics, and best practice recommendations. A written personalized care plan for preventive services as well as general preventive health recommendations  were provided to patient.  See attached scanned questionnaire for additional information.   Signed,   Lindell Noe, MHA, BS, LPN Health Coach

## 2016-10-04 NOTE — Addendum Note (Signed)
Addended by: Carter Kitten on: 10/04/2016 03:36 PM   Modules accepted: Orders

## 2016-10-04 NOTE — Assessment & Plan Note (Signed)
Was on fosamax (many years about 10 years) last checked 2009 t -3.11.Marland Kitchen Stopped fosamax 2012,  05/2016 femoral neck  -2.7  Plan recehckin 2 years.. If worsening will restart fosamax.

## 2016-10-04 NOTE — Progress Notes (Signed)
Pre visit review using our clinic review tool, if applicable. No additional management support is needed unless otherwise documented below in the visit note. 

## 2016-10-04 NOTE — Progress Notes (Signed)
I reviewed health advisor's note, was available for consultation, and agree with documentation and plan.  

## 2016-10-04 NOTE — Assessment & Plan Note (Signed)
Pt very easily bruised.

## 2016-10-04 NOTE — Assessment & Plan Note (Signed)
Pt at high risk for CAD.Marland Kitchen Not interested in statin at this time. Encouraged exercise, weight loss, healthy eating habits.

## 2016-10-17 DIAGNOSIS — H353221 Exudative age-related macular degeneration, left eye, with active choroidal neovascularization: Secondary | ICD-10-CM | POA: Diagnosis not present

## 2016-10-30 ENCOUNTER — Encounter: Payer: Self-pay | Admitting: Family Medicine

## 2016-11-02 MED ORDER — GABAPENTIN 300 MG PO CAPS: 300.0000 mg | ORAL_CAPSULE | Freq: Every day | ORAL | 1 refills | Status: DC

## 2016-11-08 ENCOUNTER — Other Ambulatory Visit: Payer: Self-pay | Admitting: Internal Medicine

## 2016-11-29 DIAGNOSIS — H353221 Exudative age-related macular degeneration, left eye, with active choroidal neovascularization: Secondary | ICD-10-CM | POA: Diagnosis not present

## 2017-01-09 ENCOUNTER — Other Ambulatory Visit: Payer: Self-pay | Admitting: Internal Medicine

## 2017-01-13 ENCOUNTER — Other Ambulatory Visit: Payer: Self-pay | Admitting: Family Medicine

## 2017-01-23 ENCOUNTER — Encounter: Payer: Self-pay | Admitting: Internal Medicine

## 2017-01-23 ENCOUNTER — Ambulatory Visit (INDEPENDENT_AMBULATORY_CARE_PROVIDER_SITE_OTHER): Payer: Medicare HMO | Admitting: Internal Medicine

## 2017-01-23 VITALS — BP 128/76 | HR 85 | Resp 16 | Ht 61.0 in | Wt 112.6 lb

## 2017-01-23 DIAGNOSIS — J9611 Chronic respiratory failure with hypoxia: Secondary | ICD-10-CM

## 2017-01-23 MED ORDER — TIOTROPIUM BROMIDE-OLODATEROL 2.5-2.5 MCG/ACT IN AERS: 1.0000 | INHALATION_SPRAY | Freq: Every day | RESPIRATORY_TRACT | 0 refills | Status: DC

## 2017-01-23 NOTE — Progress Notes (Signed)
Misty Ferguson     Date: 01/23/2017,   MRN# [id number] Misty Ferguson May 09, 1934 Code Status:  Hosp day:@LENGTHOFSTAYDAYS @ Referring MD: @ATDPROV @     PCP:      AdmissionWeight: 112 lb 9.6 oz (51.1 kg)                 CurrentWeight: 112 lb 9.6 oz (51.1 kg)  Synopsis: Misty Ferguson was first seen by the [facility] pulmonary clinic in the summer of 2013. She has COPD. Simple spirometry performed in clinic showed an FEV1 of 45% predicted with clear obstruction.  She smoked 2 pack/day x 40 years, quit  17 years ago according to patient. She is a frequent exacerbation phenotype. She uses 2 L of oxygen continuously with exertion and at night On chronic azithromycin therapy: therapy for prevention of COPD exacerbations (ie. roflumilast or azithromycin) when appopriate.   lling, mild erythema   CC follow up COPD HPI No acute issues, has chronic SOB and DOE Uses oxygen severe emphysema with bronchiectasis with R pleural based nodule Uses oxygen accordingly, no fevers, no chills Different note-RT leg injury-scabbed and bruised No pain, no swelling, mild erythema No signs of infection at this time Patient is very activ,e  exercises daily    No acute issues, has chCurrent Medication:   Current Outpatient Prescriptions:  .  albuterol (ACCUNEB) 1.25 MG/3ML nebulizer solution, Take 3 mLs (1.25 mg total) by nebulization every 6 (six) hours as needed., Disp: 75 mL, Rfl: 5 .  Ascorbic Acid (VITAMIN C PO), Take 1 tablet by mouth daily., Disp: , Rfl:  .  aspirin 81 MG tablet, Take 81 mg by mouth every other day. , Disp: , Rfl:  .  azithromycin (ZITHROMAX) 250 MG tablet, TAKE 1 TABLET EVERY DAY, Disp: 90 tablet, Rfl: 1 .  Calcium Carbonate-Vitamin D (CALCIUM 600/VITAMIN D) 600-400 MG-UNIT per tablet, Take 1 tablet by mouth 2 (two) times daily.  , Disp: , Rfl:  .  cyanocobalamin 1000 MCG tablet, Take 1,000 mcg by mouth daily., Disp: , Rfl:  .  gabapentin  (NEURONTIN) 300 MG capsule, Take 1 capsule (300 mg total) by mouth at bedtime., Disp: 90 capsule, Rfl: 1 .  losartan (COZAAR) 50 MG tablet, TAKE 1 TABLET EVERY DAY, Disp: 90 tablet, Rfl: 1 .  Multiple Vitamins-Minerals (PRESERVISION AREDS) TABS, Take 1 tablet by mouth 2 (two) times daily., Disp: , Rfl:  .  Omega-3 Fatty Acids (FISH OIL) 1000 MG CAPS, Take 1 capsule by mouth daily., Disp: , Rfl:  .  STIOLTO RESPIMAT 2.5-2.5 MCG/ACT AERS, INHALE 2 PUFFS INTO THE LUNGS EVERY DAY, Disp: 12 g, Rfl: 1     ALLERGIES   Patient has no known allergies.     REVIEW OF SYSTEMS   Review of Systems  Constitutional: Negative for chills, fever, malaise/fatigue and weight loss.  HENT: Negative for congestion.   Respiratory: Negative for cough, hemoptysis, shortness of breath and wheezing.   Cardiovascular: Negative for chest pain, palpitations and leg swelling.  Endo/Heme/Allergies: Does not bruise/bleed easily.  All other systems reviewed and are negative.    BP 128/76 (BP Location: Right Arm, Cuff Size: Normal)   Pulse 85   Resp 16   Ht 5\' 1"  (1.549 m)   Wt 112 lb 9.6 oz (51.1 kg)   SpO2 91%   BMI 21.28 kg/m    PHYSICAL EXAM   Physical Exam  Constitutional: She is oriented to person, place, and time. No distress.  Eyes: Pupils are  equal, round, and reactive to light.  Cardiovascular: Normal rate, regular rhythm and normal heart sounds.   No murmur heard. Pulmonary/Chest: Effort normal and breath sounds normal. No respiratory distress. She has no wheezes. She has no rales.  Neurological: She is alert and oriented to person, place, and time.  Skin: Skin is warm. She is not diaphoretic.         ASSESSMENT/PLAN   83 yo white female with Gold Stage D moderate/severe COPD on chronic oxygen therapy with chronic hypoxic resp failure previous  CT chest shows severe emphysema and Bronchiectasis and RLL pleural based nodule   #1 shortness of breath secondary to moderate to severe  COPD with hypoxic respiratory failure and severe emphysema  #2 severe COPD -continue stiolto(AC and LABA) -Albuterol as needed -continue azithromycin for COPD exacerbation prevention  #3 chronic hypoxic respiratory failure -continue oxygen as needed  #4.Exercises daily (40 minutes daily)   #5 right pleural based nodule Patient DOES NOT WANT repeat CT chest to assess interval changes. SHE DOES NOT want to pursue any further diagnostic testing. She states that "She is happy with her life and happy with the [name] and if its her time to go, she will go happily"    Patient satisfied with Plan of action and management. All questions answered  Follow up 1 year.  Misty Ferguson, M.D.  [facility]  Medical Director [place] Director [facility] Cardio-Pulmonary Department

## 2017-01-23 NOTE — Patient Instructions (Signed)
Continue oxygen as needed continue inhalers as prescribed

## 2017-01-23 NOTE — Addendum Note (Signed)
Addended by: Devona Konig on: 01/23/2017 11:58 AM   Modules accepted: Orders

## 2017-02-18 ENCOUNTER — Encounter: Payer: Self-pay | Admitting: Internal Medicine

## 2017-02-18 ENCOUNTER — Ambulatory Visit (INDEPENDENT_AMBULATORY_CARE_PROVIDER_SITE_OTHER): Payer: Medicare HMO | Admitting: Internal Medicine

## 2017-02-18 VITALS — BP 136/94 | HR 87 | Temp 97.5°F | Wt 111.8 lb

## 2017-02-18 DIAGNOSIS — R21 Rash and other nonspecific skin eruption: Secondary | ICD-10-CM

## 2017-02-18 MED ORDER — METHYLPREDNISOLONE ACETATE 80 MG/ML IJ SUSP
80.0000 mg | Freq: Once | INTRAMUSCULAR | Status: AC
  Administered 2017-02-18: 80 mg via INTRAMUSCULAR

## 2017-02-18 NOTE — Addendum Note (Signed)
Addended by: Lurlean Nanny on: 02/18/2017 11:02 AM   Modules accepted: Orders

## 2017-02-18 NOTE — Progress Notes (Signed)
Subjective:    Patient ID: Misty Ferguson, female    DOB: 1934-08-10, 83 y.o.   MRN: [id number]  HPI  Pt presents to the clinic today with c/o a rash. This started 10 days ago. The rash is located all over her body except where her bathing suit was. The rash itches and burns. She has been going to water aerobics. She denies changes in soaps, lotions or detergents. She denies any new food or medications. She has tried Calamine lotion with minimal relief.  Review of Systems      Past Medical History:  Diagnosis Date  . Chronic airway obstruction, not elsewhere classified   . Macular degeneration (senile) of retina, unspecified   . Obstructive chronic bronchitis with exacerbation ([place])   . Osteoporosis, unspecified   . Personal history of peptic ulcer disease   . Tobacco use disorder   . Type II or unspecified type diabetes mellitus without mention of complication, not stated as uncontrolled   . Unspecified disorder resulting from impaired renal function   . Unspecified essential hypertension   . Unspecified glaucoma(365.9)     Current Outpatient Prescriptions  Medication Sig Dispense Refill  . albuterol (ACCUNEB) 1.25 MG/3ML nebulizer solution Take 3 mLs (1.25 mg total) by nebulization every 6 (six) hours as needed. 75 mL 5  . Ascorbic Acid (VITAMIN C PO) Take 1 tablet by mouth daily.    Marland Kitchen aspirin 81 MG tablet Take 81 mg by mouth every other day.     Marland Kitchen azithromycin (ZITHROMAX) 250 MG tablet TAKE 1 TABLET EVERY DAY 90 tablet 1  . Calcium Carbonate-Vitamin D (CALCIUM 600/VITAMIN D) 600-400 MG-UNIT per tablet Take 1 tablet by mouth 2 (two) times daily.      . cyanocobalamin 1000 MCG tablet Take 1,000 mcg by mouth daily.    Marland Kitchen gabapentin (NEURONTIN) 300 MG capsule Take 1 capsule (300 mg total) by mouth at bedtime. 90 capsule 1  . losartan (COZAAR) 50 MG tablet TAKE 1 TABLET EVERY DAY 90 tablet 1  . Multiple Vitamins-Minerals (PRESERVISION AREDS) TABS Take 1 tablet by mouth 2 (two)  times daily.    . Omega-3 Fatty Acids (FISH OIL) 1000 MG CAPS Take 1 capsule by mouth daily.    . Tiotropium Bromide-Olodaterol (STIOLTO RESPIMAT) 2.5-2.5 MCG/ACT AERS Inhale 1 puff into the lungs daily. 12 g 0   No current facility-administered medications for this visit.     No Known Allergies  No family history on file.  Social History   Social History  . Marital status: Married    Spouse name: N/A  . Number of children: 3  . Years of education: N/A   Occupational History  . RETIRED Retired   Social History Main Topics  . Smoking status: Former Smoker    Packs/day: 1.30    Years: 45.00    Types: Cigarettes    Quit date: 08/14/2003  . Smokeless tobacco: Never Used  . Alcohol use Yes     Comment: Occasional  . Drug use: No  . Sexual activity: Yes   Other Topics Concern  . Not on file   Social History Narrative   Tobacco abuse x 45 years        Constitutional: Denies fever, malaise, fatigue, headache or abrupt weight changes.  Skin: Pt reports rash. Denies lesions or ulcercations.    No other specific complaints in a complete review of systems (except as listed in HPI above).  Objective:   Physical Exam  BP (!) 136/94  Pulse 87   Temp (!) 97.5 F (36.4 C) (Oral)   Wt 111 lb 12 oz (50.7 kg)   SpO2 96%   BMI 21.11 kg/m  Wt Readings from Last 3 Encounters:  02/18/17 111 lb 12 oz (50.7 kg)  01/23/17 112 lb 9.6 oz (51.1 kg)  10/04/16 110 lb 4 oz (50 kg)    General: Appears her stated age, chronically ill appearing, in NAD. Skin: Scattered maculopapular rash noted on lower extremities, upper extremities and chest. She also has multiple bruises and skin tears on arms and legs bilaterally.   BMET    Component Value Date/Time   NA 141 09/27/2016 0810   K 4.6 09/27/2016 0810   K 4.1 07/23/2012 1536   CL 106 09/27/2016 0810   CO2 28 09/27/2016 0810   GLUCOSE 99 09/27/2016 0810   BUN 22 09/27/2016 0810   CREATININE 1.01 09/27/2016 0810   CREATININE  1.26 (H) 12/23/2012 1440   CALCIUM 9.6 09/27/2016 0810   GFRNONAA 40 (L) 08/19/2010 0530   GFRAA (L) 08/19/2010 0530    49        The eGFR has been calculated using the MDRD equation. This calculation has not been validated in all clinical situations. eGFR's persistently <60 mL/min signify possible Chronic Kidney Disease.    Lipid Panel     Component Value Date/Time   CHOL 282 (H) 09/27/2016 0810   TRIG 48.0 09/27/2016 0810   HDL 165.40 09/27/2016 0810   CHOLHDL 2 09/27/2016 0810   VLDL 9.6 09/27/2016 0810   LDLCALC 107 (H) 09/27/2016 0810    CBC    Component Value Date/Time   WBC 5.2 08/18/2010 1532   RBC 4.12 08/18/2010 1532   HGB 12.7 08/18/2010 1532   HCT 37.7 08/18/2010 1532   PLT 166 08/18/2010 1532   MCV 91.5 08/18/2010 1532   MCH 30.8 08/18/2010 1532   MCHC 33.7 08/18/2010 1532   RDW 13.4 08/18/2010 1532   LYMPHSABS 0.8 08/18/2010 1532   MONOABS 0.3 08/18/2010 1532   EOSABS 0.0 08/18/2010 1532   BASOSABS 0.0 08/18/2010 1532    Hgb A1C Lab Results  Component Value Date   HGBA1C 5.9 09/27/2016           Assessment & Plan:   Rash:  80 mg Depo IM today Start Zyrtec 1 tab daily x 2 weeks   Return precautions discussed Webb Silversmith, NP

## 2017-02-18 NOTE — Patient Instructions (Signed)
Rash A rash is a change in the color of the skin. A rash can also change the way your skin feels. There are many different conditions and factors that can cause a rash. Follow these instructions at home: Pay attention to any changes in your symptoms. Follow these instructions to help with your condition: Medicine Take or apply over-the-counter and prescription medicines only as told by your health care provider. These may include:  Corticosteroid cream.  Anti-itch lotions.  Oral antihistamines.  Skin Care  Apply cool compresses to the affected areas.  Try taking a bath with: ? Epsom salts. Follow the instructions on the packaging. You can get these at your local pharmacy or grocery store. ? Baking soda. Pour a small amount into the bath as told by your health care provider. ? Colloidal oatmeal. Follow the instructions on the packaging. You can get this at your local pharmacy or grocery store.  Try applying baking soda paste to your skin. Stir water into baking soda until it reaches a paste-like consistency.  Do not scratch or rub your skin.  Avoid covering the rash. Make sure the rash is exposed to air as much as possible. General instructions  Avoid hot showers or baths, which can make itching worse. A cold shower may help.  Avoid scented soaps, detergents, and perfumes. Use gentle soaps, detergents, perfumes, and other cosmetic products.  Avoid any substance that causes your rash. Keep a journal to help track what causes your rash. Write down: ? What you eat. ? What cosmetic products you use. ? What you drink. ? What you wear. This includes jewelry.  Keep all follow-up visits as told by your health care provider. This is important. Contact a health care provider if:  You sweat at night.  You lose weight.  You urinate more than normal.  You feel weak.  You vomit.  Your skin or the whites of your eyes look yellow (jaundice).  Your skin: ? Tingles. ? Is  numb.  Your rash: ? Does not go away after several days. ? Gets worse.  You are: ? Unusually thirsty. ? More tired than normal.  You have: ? New symptoms. ? Pain in your abdomen. ? A fever. ? Diarrhea. Get help right away if:  You develop a rash that covers all or most of your body. The rash may or may not be painful.  You develop blisters that: ? Are on top of the rash. ? Grow larger or grow together. ? Are painful. ? Are inside your nose or mouth.  You develop a rash that: ? Looks like purple pinprick-sized spots all over your body. ? Has a "bull's eye" or looks like a target. ? Is not related to sun exposure, is red and painful, and causes your skin to peel. This information is not intended to replace advice given to you by your health care provider. Make sure you discuss any questions you have with your health care provider. Document Released: 07/20/2002 Document Revised: 01/03/2016 Document Reviewed: 12/15/2014 Elsevier Interactive Patient Education  2017 [employer]  

## 2017-02-21 DIAGNOSIS — H353221 Exudative age-related macular degeneration, left eye, with active choroidal neovascularization: Secondary | ICD-10-CM | POA: Diagnosis not present

## 2017-03-17 ENCOUNTER — Encounter: Payer: Self-pay | Admitting: Family Medicine

## 2017-03-17 DIAGNOSIS — L97901 Non-pressure chronic ulcer of unspecified part of unspecified lower leg limited to breakdown of skin: Secondary | ICD-10-CM

## 2017-03-19 ENCOUNTER — Ambulatory Visit: Payer: Medicare HMO

## 2017-03-19 ENCOUNTER — Other Ambulatory Visit: Payer: Self-pay | Admitting: Family Medicine

## 2017-03-19 DIAGNOSIS — M79605 Pain in left leg: Secondary | ICD-10-CM

## 2017-03-19 DIAGNOSIS — L97901 Non-pressure chronic ulcer of unspecified part of unspecified lower leg limited to breakdown of skin: Secondary | ICD-10-CM

## 2017-03-19 DIAGNOSIS — M79604 Pain in right leg: Secondary | ICD-10-CM

## 2017-03-21 ENCOUNTER — Encounter: Payer: Self-pay | Admitting: Family Medicine

## 2017-03-22 ENCOUNTER — Encounter: Payer: Self-pay | Admitting: Family Medicine

## 2017-03-22 ENCOUNTER — Ambulatory Visit (INDEPENDENT_AMBULATORY_CARE_PROVIDER_SITE_OTHER): Payer: Medicare HMO | Admitting: Family Medicine

## 2017-03-22 DIAGNOSIS — R21 Rash and other nonspecific skin eruption: Secondary | ICD-10-CM | POA: Diagnosis not present

## 2017-03-22 MED ORDER — BETAMETHASONE DIPROPIONATE 0.05 % EX CREA: TOPICAL_CREAM | Freq: Two times a day (BID) | CUTANEOUS | 1 refills | Status: DC

## 2017-03-22 NOTE — Patient Instructions (Addendum)
Follow BP at home.. Goal < 140/90.  Apply topical steroid cream twice daily.  If not improving in next 1-2 weeks, call for referral to dermatology.  Apply once daily cetaphil cream. Stop all other creams or treatments.

## 2017-03-22 NOTE — Progress Notes (Signed)
   Subjective:    Patient ID: Misty Ferguson, female    DOB: July 06, 1934, 83 y.o.   MRN: [id number]  HPI   83 year old female presents with chronic pain in bilateral legs as well as intermittent sores and darkening of skin on legs.  This has been ongoing for years, but pain in legs has worsened in last month. Pain is knee down bilaterally.  Legs are slightly swollen, red irritated skin, excoriations.  She does remember in 02/18/2017 Reaction to pool .Marland Kitchen In last month.. Go some better  On rest of body.. But continue rash on legs.   She has been using moisturizing lotion. Elevates legs, trys to exercise on treadmill.  Doppers were negative for reflux or DVTs.   Review of Systems  Constitutional: Negative for fatigue and fever.  HENT: Negative for ear pain.   Eyes: Negative for pain.  Respiratory: Negative for chest tightness and shortness of breath.   Cardiovascular: Positive for leg swelling. Negative for chest pain and palpitations.  Gastrointestinal: Negative for abdominal pain.  Genitourinary: Negative for dysuria.       Objective:   Physical Exam  Constitutional: Vital signs are normal. She appears well-developed and well-nourished. She is cooperative.  Non-toxic appearance. She does not appear ill. No distress.  HENT:  Head: Normocephalic.  Right Ear: Hearing, tympanic membrane, external ear and ear canal normal. Tympanic membrane is not erythematous, not retracted and not bulging.  Left Ear: Hearing, tympanic membrane, external ear and ear canal normal. Tympanic membrane is not erythematous, not retracted and not bulging.  Nose: No mucosal edema or rhinorrhea. Right sinus exhibits no maxillary sinus tenderness and no frontal sinus tenderness. Left sinus exhibits no maxillary sinus tenderness and no frontal sinus tenderness.  Mouth/Throat: Uvula is midline, oropharynx is clear and moist and mucous membranes are normal.  Eyes: Pupils are equal, round, and reactive to light.  Conjunctivae, EOM and lids are normal. Lids are everted and swept, no foreign bodies found.  Neck: Trachea normal and normal range of motion. Neck supple. Carotid bruit is not present. No thyroid mass and no thyromegaly present.  Cardiovascular: Normal rate, regular rhythm, S1 normal, S2 normal, normal heart sounds, intact distal pulses and normal pulses.  Exam reveals no gallop and no friction rub.   No murmur heard. Pulmonary/Chest: Effort normal. No tachypnea. No respiratory distress. She has decreased breath sounds. She has no wheezes. She has no rhonchi. She has no rales.  Stable lung exam  Abdominal: Soft. Normal appearance and bowel sounds are normal. There is no tenderness.  Neurological: She is alert.  Skin: Skin is warm, dry and intact. No rash noted.  Hyperpigmented skin bilateral lower legs  scabs, excoriations and erythematou skin lower leg and upper leg, indurated   edematous skin, but no pedal edema  minimal varicosities.  Psychiatric: Her speech is normal and behavior is normal. Judgment and thought content normal. Her mood appears not anxious. Cognition and memory are normal. She does not exhibit a depressed mood.          Assessment & Plan:

## 2017-03-22 NOTE — Assessment & Plan Note (Signed)
Dopplers not consistent with venous insufficiency.  Rash not consistent with venous ulcer on exam ( not at lateral malleolus ) and minimal edema/varicosities. Rash is puritis.Marland Kitchen Appears inflammatory.  ? Is related to recent skin rash from pool.  Treat with topical steroid, but if not improving.Marland Kitchen Refer to derm.

## 2017-03-29 ENCOUNTER — Encounter: Payer: Self-pay | Admitting: Family Medicine

## 2017-04-17 ENCOUNTER — Ambulatory Visit (INDEPENDENT_AMBULATORY_CARE_PROVIDER_SITE_OTHER): Payer: Medicare HMO

## 2017-04-17 DIAGNOSIS — Z23 Encounter for immunization: Secondary | ICD-10-CM

## 2017-04-18 ENCOUNTER — Other Ambulatory Visit: Payer: Self-pay | Admitting: Family Medicine

## 2017-04-18 NOTE — Telephone Encounter (Signed)
Last office visit 03/22/2017.  Last refilled 11/02/2016 for #90 with 1 refill.  Ok to refill?

## 2017-05-16 DIAGNOSIS — H353131 Nonexudative age-related macular degeneration, bilateral, early dry stage: Secondary | ICD-10-CM | POA: Diagnosis not present

## 2017-05-28 ENCOUNTER — Other Ambulatory Visit: Payer: Self-pay | Admitting: Internal Medicine

## 2017-06-04 ENCOUNTER — Encounter: Payer: Self-pay | Admitting: Family Medicine

## 2017-06-21 ENCOUNTER — Encounter: Payer: Self-pay | Admitting: Family Medicine

## 2017-06-21 DIAGNOSIS — R21 Rash and other nonspecific skin eruption: Secondary | ICD-10-CM

## 2017-06-26 ENCOUNTER — Encounter: Payer: Self-pay | Admitting: Family Medicine

## 2017-07-01 ENCOUNTER — Encounter: Payer: Self-pay | Admitting: Family Medicine

## 2017-07-01 DIAGNOSIS — L209 Atopic dermatitis, unspecified: Secondary | ICD-10-CM | POA: Diagnosis not present

## 2017-07-01 DIAGNOSIS — R21 Rash and other nonspecific skin eruption: Secondary | ICD-10-CM | POA: Diagnosis not present

## 2017-07-01 NOTE — Telephone Encounter (Signed)
Marion.. Will you help this lady for me?  I put in a referral but  I think she did not want to wait  For you to cal back so she called derm herself. Can you call her? Have you made another referral to [facility] as it says in chart? She wants a derm referral ASAP because she is really bothered by the rash on her legs.

## 2017-07-15 DIAGNOSIS — H353221 Exudative age-related macular degeneration, left eye, with active choroidal neovascularization: Secondary | ICD-10-CM | POA: Diagnosis not present

## 2017-07-16 DIAGNOSIS — L209 Atopic dermatitis, unspecified: Secondary | ICD-10-CM | POA: Diagnosis not present

## 2017-08-23 ENCOUNTER — Other Ambulatory Visit: Payer: Self-pay | Admitting: Internal Medicine

## 2017-08-23 ENCOUNTER — Ambulatory Visit: Payer: Medicare HMO | Admitting: Internal Medicine

## 2017-08-23 ENCOUNTER — Encounter: Payer: Self-pay | Admitting: Internal Medicine

## 2017-08-23 ENCOUNTER — Other Ambulatory Visit: Payer: Self-pay | Admitting: Family Medicine

## 2017-08-23 VITALS — BP 124/70 | HR 99 | Ht 61.0 in | Wt 115.8 lb

## 2017-08-23 DIAGNOSIS — J9611 Chronic respiratory failure with hypoxia: Secondary | ICD-10-CM | POA: Diagnosis not present

## 2017-08-23 DIAGNOSIS — J449 Chronic obstructive pulmonary disease, unspecified: Secondary | ICD-10-CM

## 2017-08-23 MED ORDER — ALBUTEROL SULFATE HFA 108 (90 BASE) MCG/ACT IN AERS: 2.0000 | INHALATION_SPRAY | Freq: Four times a day (QID) | RESPIRATORY_TRACT | 6 refills | Status: DC | PRN

## 2017-08-23 NOTE — Progress Notes (Signed)
* [facility]     Assessment and Plan:  Emphysema/COPD with chronic exertional dyspnea.  -We discussed that her chronic exertional dyspnea is secondary to her severe COPD, and hyperinflation of lungs, provided reassurance that this can happen even when her oxygen levels are normal - I encouraged her to use a rescue inhaler when she is feeling winded which she is not doing currently. -I also discussed that pulmonary rehab may be helpful in helping her manage her symptoms of dyspnea, therefore a referral was provided today. -She also has a nebulizer with medications, I encouraged her that she can use this in addition when she feels winded.  Patient had concerns about "elevated carbon dioxide levels".  We reviewed her most recent chemistry panel which showed normal carbon dioxide levels, she is going to have another drawn by her PCP in the next few weeks. -Flu up-to-date  Tachycardia. -Patient had concerns about high heart rate when she is active and winded, and sometimes also occurring of 100-110 at rest. - Provided reassurance that this is normal and a compensatory for her respiratory disease.  In addition this can also be caused by her Stiolto inhaler.  We discussed a possible referral to cardiology, but she appeared to be reassured and did not request that at this time.  Meds ordered this encounter  Medications  . albuterol (PROVENTIL HFA;VENTOLIN HFA) 108 (90 Base) MCG/ACT inhaler    Sig: Inhale 2 puffs into the lungs every 6 (six) hours as needed for wheezing or shortness of breath.    Dispense:  1 Inhaler    Refill:  6   Orders Placed This Encounter  Procedures  . AMB referral to pulmonary rehabilitation    Referral Priority:   Routine    Referral Type:   Consultation    Number of Visits Requested:   1   Return in about 6 months (around 02/20/2018).   Date: 08/23/2017  MRN# [id number] Misty Ferguson 07-03-1934   Misty Ferguson is a 84 y.o. old female seen  in follow up for chief complaint of  Chief Complaint  Patient presents with  . Acute Visit    pt reports of increased sob & wheezing x7mo     HPI:   She has severe COPD, and normally follows with Dr. Mortimer Fries. She comes in early today because she feels that her breathing is worse, she can not catch her breath, and she notices that her oxygen is normal. She has continued on supplemental oxygen.  She has been speaking with other patients and she is concerned that "I have excess carbon dioxide in my lungs", and other concerns that she has developed after doing Internet research. She has never been diagnosed OSA or been on PAP.  She has continued stiolto once daily, she does not use rescue inhalers. She has never gone through pulmonary rehab.   Medication:    Current Outpatient Medications:  .  albuterol (ACCUNEB) 1.25 MG/3ML nebulizer solution, Take 3 mLs (1.25 mg total) by nebulization every 6 (six) hours as needed., Disp: 75 mL, Rfl: 5 .  Ascorbic Acid (VITAMIN C PO), Take 1 tablet by mouth daily., Disp: , Rfl:  .  aspirin 81 MG tablet, Take 81 mg by mouth every other day. , Disp: , Rfl:  .  azithromycin (ZITHROMAX) 250 MG tablet, TAKE 1 TABLET EVERY DAY, Disp: 90 tablet, Rfl: 1 .  betamethasone dipropionate (DIPROLENE) 0.05 % cream, Apply topically 2 (two) times daily., Disp: 30 g, Rfl:  1 .  Calcium Carbonate-Vitamin D (CALCIUM 600/VITAMIN D) 600-400 MG-UNIT per tablet, Take 1 tablet by mouth 2 (two) times daily.  , Disp: , Rfl:  .  cyanocobalamin 1000 MCG tablet, Take 1,000 mcg by mouth daily., Disp: , Rfl:  .  gabapentin (NEURONTIN) 300 MG capsule, TAKE 1 CAPSULE AT BEDTIME, Disp: 90 capsule, Rfl: 1 .  losartan (COZAAR) 50 MG tablet, TAKE 1 TABLET EVERY DAY, Disp: 90 tablet, Rfl: 0 .  Multiple Vitamins-Minerals (PRESERVISION AREDS) TABS, Take 1 tablet by mouth 2 (two) times daily., Disp: , Rfl:  .  Omega-3 Fatty Acids (FISH OIL) 1000 MG CAPS, Take 1 capsule by mouth daily., Disp: , Rfl:  .   STIOLTO RESPIMAT 2.5-2.5 MCG/ACT AERS, INHALE 2 PUFFS INTO THE LUNGS EVERY DAY, Disp: 12 g, Rfl: 1   Allergies:  Patient has no known allergies.  Review of Systems: Gen:  Denies  fever, sweats. HEENT: Denies blurred vision. Cvc:  No dizziness, chest pain or heaviness Resp:   Denies cough or sputum porduction. Gi: Denies swallowing difficulty, stomach pain. constipation, bowel incontinence Gu:  Denies bladder incontinence, burning urine Ext:   No Joint pain, stiffness. Skin: No skin rash, easy bruising. Endoc:  No polyuria, polydipsia. Psych: No depression, insomnia. Other:  All other systems were reviewed and found to be negative other than what is mentioned in the HPI.   Physical Examination:   VS: BP 124/70 (BP Location: Left Arm, Cuff Size: Normal)   Pulse 99   Ht 5\' 1"  (1.549 m)   Wt 115 lb 12.8 oz (52.5 kg)   SpO2 97%   BMI 21.88 kg/m    General Appearance: No distress  Neuro:without focal findings,  speech normal,  HEENT: PERRLA, EOM intact. Pulmonary: normal breath sounds, No wheezing.  Decreased air entry bilaterally. CardiovascularNormal S1,S2.  No m/r/g.   Abdomen: Benign, Soft, non-tender. Renal:  No costovertebral tenderness  GU:  Not performed at this time. Endoc: No evident thyromegaly, no signs of acromegaly. Skin:   warm, no rash. Extremities: normal, no cyanosis, clubbing.   LABORATORY PANEL:   CBC No results for input(s): WBC, HGB, HCT, PLT in the last 168 hours. ------------------------------------------------------------------------------------------------------------------  Chemistries  No results for input(s): NA, K, CL, CO2, GLUCOSE, BUN, CREATININE, CALCIUM, MG, AST, ALT, ALKPHOS, BILITOT in the last 168 hours.  Invalid input(s): GFRCGP ------------------------------------------------------------------------------------------------------------------  Cardiac Enzymes No results for input(s): TROPONINI in the last 168  hours. ------------------------------------------------------------  RADIOLOGY:   No results found for this or any previous visit. Results for orders placed during the hospital encounter of 01/28/12  DG Chest 2 View   Narrative *RADIOLOGY REPORT*  Clinical Data: Follow up pneumonia, COPD  CHEST - 2 VIEW  Comparison: 10/03/2011  Findings: Normal heart size, mediastinal contours, and pulmonary vascularity. Atherosclerotic calcification aorta. Emphysematous changes consistent with COPD. Nodular foci projecting over the inferior lungs bilaterally likely nipple shadows, unchanged since 08/18/2010. Bones appear diffusely demineralized. Dextroconvex thoracolumbar lumbar scoliosis. Multiple radiopacities in upper abdomen consistent with chronic calcific pancreatitis.  IMPRESSION: Changes of COPD. Chronic calcific pancreatitis. No acute abnormalities.  Original Report Authenticated By: Burnetta Sabin, M.D.   ------------------------------------------------------------------------------------------------------------------  Thank  you for allowing [facility] to assist in the care of your patient. Our recommendations are noted above.  Please contact us if we can be of further service.   Marda Stalker, MD.  [facility] Office Number: [phone]  Patricia Pesa, M.D.  Merton Border, M.D  08/23/2017  

## 2017-08-23 NOTE — Patient Instructions (Addendum)
Use rescue inhaler 2 puffs before activity or during activity which makes you winded.   Will refer to pulmonary rehab.

## 2017-08-28 ENCOUNTER — Encounter (INDEPENDENT_AMBULATORY_CARE_PROVIDER_SITE_OTHER): Payer: Self-pay

## 2017-09-08 ENCOUNTER — Other Ambulatory Visit: Payer: Self-pay

## 2017-09-08 ENCOUNTER — Emergency Department: Payer: Medicare HMO

## 2017-09-08 ENCOUNTER — Emergency Department
Admission: EM | Admit: 2017-09-08 | Discharge: 2017-09-08 | Disposition: A | Discharge status: Home / Self Care | Payer: Medicare HMO | Attending: Emergency Medicine | Admitting: Emergency Medicine

## 2017-09-08 DIAGNOSIS — J441 Chronic obstructive pulmonary disease with (acute) exacerbation: Secondary | ICD-10-CM

## 2017-09-08 DIAGNOSIS — Z87891 Personal history of nicotine dependence: Secondary | ICD-10-CM | POA: Insufficient documentation

## 2017-09-08 DIAGNOSIS — R000 Tachycardia, unspecified: Secondary | ICD-10-CM | POA: Diagnosis not present

## 2017-09-08 DIAGNOSIS — I10 Essential (primary) hypertension: Secondary | ICD-10-CM | POA: Insufficient documentation

## 2017-09-08 DIAGNOSIS — I4510 Unspecified right bundle-branch block: Secondary | ICD-10-CM | POA: Diagnosis not present

## 2017-09-08 DIAGNOSIS — Z79899 Other long term (current) drug therapy: Secondary | ICD-10-CM | POA: Insufficient documentation

## 2017-09-08 DIAGNOSIS — E1140 Type 2 diabetes mellitus with diabetic neuropathy, unspecified: Secondary | ICD-10-CM | POA: Diagnosis not present

## 2017-09-08 DIAGNOSIS — Z7982 Long term (current) use of aspirin: Secondary | ICD-10-CM | POA: Diagnosis not present

## 2017-09-08 DIAGNOSIS — R0602 Shortness of breath: Secondary | ICD-10-CM

## 2017-09-08 LAB — BASIC METABOLIC PANEL
Anion gap: 12 (ref 5–15)
BUN: 24 mg/dL — ABNORMAL HIGH (ref 6–20)
CO2: 25 mmol/L (ref 22–32)
Calcium: 9.8 mg/dL (ref 8.9–10.3)
Chloride: 100 mmol/L — ABNORMAL LOW (ref 101–111)
Creatinine, Ser: 1.01 mg/dL — ABNORMAL HIGH (ref 0.44–1.00)
GFR, EST AFRICAN AMERICAN: 59 mL/min — AB (ref >60)
GFR, EST NON AFRICAN AMERICAN: 51 mL/min — AB (ref >60)
GLUCOSE: 141 mg/dL — AB (ref 65–99)
POTASSIUM: 4 mmol/L (ref 3.5–5.1)
Sodium: 137 mmol/L (ref 135–145)

## 2017-09-08 LAB — CBC WITH DIFFERENTIAL/PLATELET
Basophils Absolute: 0.1 10*3/uL (ref 0–0.1)
Basophils Relative: 1 %
EOS PCT: 2 %
Eosinophils Absolute: 0.1 10*3/uL (ref 0–0.7)
HEMATOCRIT: 41.5 % (ref 35.0–47.0)
Hemoglobin: 14.2 g/dL (ref 12.0–16.0)
LYMPHS PCT: 27 %
Lymphs Abs: 1.9 10*3/uL (ref 1.0–3.6)
MCH: 32.2 pg (ref 26.0–34.0)
MCHC: 34.1 g/dL (ref 32.0–36.0)
MCV: 94.3 fL (ref 80.0–100.0)
MONO ABS: 0.6 10*3/uL (ref 0.2–0.9)
Monocytes Relative: 9 %
Neutro Abs: 4.5 10*3/uL (ref 1.4–6.5)
Neutrophils Relative %: 61 %
PLATELETS: 232 10*3/uL (ref 150–440)
RBC: 4.4 MIL/uL (ref 3.80–5.20)
RDW: 12.7 % (ref 11.5–14.5)
WBC: 7.3 10*3/uL (ref 3.6–11.0)

## 2017-09-08 LAB — PROTIME-INR
INR: 0.88
Prothrombin Time: 11.9 seconds (ref 11.4–15.2)

## 2017-09-08 LAB — BRAIN NATRIURETIC PEPTIDE: B NATRIURETIC PEPTIDE 5: 35 pg/mL (ref 0.0–100.0)

## 2017-09-08 LAB — TROPONIN I: Troponin I: <0.03 ng/mL (ref <0.03)

## 2017-09-08 MED ORDER — PREDNISONE 20 MG PO TABS: 60.0000 mg | ORAL_TABLET | Freq: Every day | ORAL | 0 refills | Status: DC

## 2017-09-08 MED ORDER — ALBUTEROL SULFATE (2.5 MG/3ML) 0.083% IN NEBU: 5.0000 mg | INHALATION_SOLUTION | Freq: Once | RESPIRATORY_TRACT | Status: DC

## 2017-09-08 MED ORDER — ALBUTEROL SULFATE 1.25 MG/3ML IN NEBU: 1.0000 | INHALATION_SOLUTION | Freq: Four times a day (QID) | RESPIRATORY_TRACT | 5 refills | Status: DC | PRN

## 2017-09-08 MED ORDER — SODIUM CHLORIDE 0.9 % IV BOLUS (SEPSIS)
500.0000 mL | Freq: Once | INTRAVENOUS | Status: AC
  Administered 2017-09-08: 500 mL via INTRAVENOUS

## 2017-09-08 NOTE — Discharge Instructions (Signed)
Return to the emergency room for any new or worrisome symptoms.  We discussed extensively admitting him to the hospital you very much would prefer to go home.  This certainly is your choice but does limit her ability to take care of you.  Take it very easy over the next couple days, if you have high fever shortness of breath, weakness or you feel worse in any way of course return to the emergency room.  Use your  oxygen at all times as we know you do.  We suggest using a stool in the shower as this may help reduce the amount of energy expenditure required to bathe yourself and help limit these exacerbations.  See doctor and primary care doctor as soon as possible.  If you feel worse in any way, return to the emergency department

## 2017-09-08 NOTE — ED Provider Notes (Addendum)
[facility] Emergency Department Provider Note  ____________________________________________   I have reviewed the triage vital signs and the nursing notes. Where available I have reviewed prior notes and, if possible and indicated, outside hospital notes.    HISTORY  Chief Complaint Shortness of Breath    HPI Misty Ferguson is a 84 y.o. female a history of COPD on 2 L of oxygen shower, that degree of activity is usually enough to get her winded however she rapidly recovers however today, she was unable to completely recover and to call 911.  She was on her oxygen, 2 L, the whole time.  She denies any chest pain or leg swelling, nausea, or URI/fever times.  She states that this time, as sometimes happens, she simply was not able to recover from her activities of daily life.  EMS did find her with some degree of hypoxia on her oxygen and she was in some degree of respiratory distress with very tight lungs, they gave her Solu-Medrol and breathing treatments and at this time, she is doing better with increased air motion she subjectively feels better.      Past Medical History:  Diagnosis Date  . Chronic airway obstruction, not elsewhere classified   . Macular degeneration (senile) of retina, unspecified   . Obstructive chronic bronchitis with exacerbation ([place])   . Osteoporosis, unspecified   . Personal history of peptic ulcer disease   . Tobacco use disorder   . Type II or unspecified type diabetes mellitus without mention of complication, not stated as uncontrolled   . Unspecified disorder resulting from impaired renal function   . Unspecified essential hypertension   . Unspecified glaucoma(365.9)     Patient Active Problem List   Diagnosis Date Noted  . Rash 03/22/2017  . Senile purpura ([place]) 10/04/2016  . Peripheral neuropathy 07/24/2016  . Counseling regarding end of life decision making 09/21/2014  . Allergic rhinitis 12/23/2012  . Chronic  hypoxemic respiratory failure ([place]) 08/25/2012  . MICROALBUMINURIA 04/26/2010  . HYPERCHOLESTEROLEMIA 03/24/2008  . Prediabetes 12/11/2006  . MACULAR DEGENERATION 12/10/2006  . GLAUCOMA 12/10/2006  . Essential hypertension, benign 12/10/2006  . COPD (chronic obstructive pulmonary disease) with emphysema ([place]) 12/10/2006  . Osteoporosis 12/10/2006    History reviewed. No pertinent surgical history.  Prior to Admission medications   Medication Sig Start Date End Date Taking? Authorizing Provider  albuterol (ACCUNEB) 1.25 MG/3ML nebulizer solution Take 3 mLs (1.25 mg total) by nebulization every 6 (six) hours as needed. 08/10/13  Yes [name], Amy E, MD  albuterol (PROVENTIL HFA;VENTOLIN HFA) 108 (90 Base) MCG/ACT inhaler Inhale 2 puffs into the lungs every 6 (six) hours as needed for wheezing or shortness of breath. 08/23/17  Yes Laverle Hobby, MD  Ascorbic Acid (VITAMIN C PO) Take 1 tablet by mouth daily.   Yes [provider]  aspirin 81 MG tablet Take 81 mg by mouth at bedtime.    Yes [provider]  azithromycin (ZITHROMAX) 250 MG tablet TAKE 1 TABLET EVERY DAY 05/28/17  Yes Flora Lipps, MD  Calcium Carbonate-Vitamin D (CALCIUM 600/VITAMIN D) 600-400 MG-UNIT per tablet Take 1 tablet by mouth 2 (two) times daily.     Yes [provider]  losartan (COZAAR) 50 MG tablet TAKE 1 TABLET EVERY DAY 08/23/17  Yes [name], Amy E, MD  Multiple Vitamins-Minerals (PRESERVISION AREDS) TABS Take 1 tablet by mouth 2 (two) times daily.   Yes [provider]  Omega-3 Fatty Acids (FISH OIL) 1000 MG CAPS Take  1 capsule by mouth 2 (two) times daily.    Yes [provider]  STIOLTO RESPIMAT 2.5-2.5 MCG/ACT AERS INHALE 2 PUFFS INTO THE LUNGS EVERY DAY 08/23/17  Yes Flora Lipps, MD  triamcinolone cream (KENALOG) 0.1 % Apply 1 application topically 2 (two) times daily. Use up to 5 days per week. Avoid face, groin and underarms. 07/16/17  Yes [provider]  betamethasone dipropionate (DIPROLENE) 0.05 % cream Apply topically 2 (two) times daily. Patient not taking: Reported on 09/08/2017 03/22/17   [name], MD  gabapentin (NEURONTIN) 300 MG capsule TAKE 1 CAPSULE AT BEDTIME Patient not taking: Reported on 09/08/2017 04/18/17   [name], MD    Allergies Patient has no known allergies.  No family history on file.  Social History Social History   Tobacco Use  . Smoking status: Former Smoker    Packs/day: 1.30    Years: 45.00    Pack years: 58.50    Types: Cigarettes    Last attempt to quit: 08/14/2003    Years since quitting: 14.0  . Smokeless tobacco: Never Used  Substance Use Topics  . Alcohol use: Yes    Comment: Occasional  . Drug use: No    Review of Systems Constitutional: No fever/chills Eyes: No visual changes. ENT: No sore throat. No stiff neck no neck pain Cardiovascular: Denies chest pain. Respiratory: + shortness of breath. Gastrointestinal:   no vomiting.  No diarrhea.  No constipation. Genitourinary: Negative for dysuria. Musculoskeletal: Negative lower extremity swelling Skin: Negative for rash. Neurological: Negative for severe headaches, focal weakness or numbness.   ____________________________________________   PHYSICAL EXAM:  VITAL SIGNS: ED Triage Vitals  Enc Vitals Group     BP 09/08/17 1118 (!) 196/101     Pulse Rate 09/08/17 1118 (!) 121     Resp 09/08/17 1118 (!) 26     Temp 09/08/17 1118 97.8 F (36.6 C)     Temp Source 09/08/17 1118 Oral     SpO2 09/08/17 1118 100 %     Weight 09/08/17 1120 112 lb (50.8 kg)     Height 09/08/17 1120 5\' 1"  (1.549 m)     Head Circumference --      Peak Flow --      Pain Score --      Pain Loc --      Pain Edu? --      Excl. in [place]? --     Constitutional: Alert and oriented. Well appearing and in no acute distress. Eyes: Conjunctivae are normal Head: Atraumatic HEENT: No congestion/rhinnorhea. Mucous membranes are moist.  Oropharynx  non-erythematous Neck:   Nontender with no meningismus, no masses, no stridor Cardiovascular: Tachycardia noted,, regular rhythm. Grossly normal heart sounds.  Good peripheral circulation. Respiratory: Increased respiratory effort, though speak in full sentences, diffuse slight wheeze noted at this time Abdominal: Soft and nontender. No distention. No guarding no rebound Back:  There is no focal tenderness or step off.  there is no midline tenderness there are no lesions noted. there is no CVA tenderness Musculoskeletal: No lower extremity tenderness, no upper extremity tenderness. No joint effusions, no DVT signs strong distal pulses no edema Neurologic:  Normal speech and language. No gross focal neurologic deficits are appreciated.  Skin:  Skin is warm, dry and intact. No rash noted. Psychiatric: Mood and affect are normal. Speech and behavior are normal.  ____________________________________________   LABS (all labs ordered are listed, but only abnormal results are displayed)  Labs  Reviewed  BASIC METABOLIC PANEL - Abnormal; Notable for the following components:      Result Value   Chloride 100 (*)    Glucose, Bld 141 (*)    BUN 24 (*)    Creatinine, Ser 1.01 (*)    GFR calc non Af Amer 51 (*)    GFR calc Af Amer 59 (*)    All other components within normal limits  CBC WITH DIFFERENTIAL/PLATELET  PROTIME-INR  TROPONIN I  BRAIN NATRIURETIC PEPTIDE    Pertinent labs  results that were available during my care of the patient were reviewed by me and considered in my medical decision making (see chart for details). ____________________________________________  EKG  I personally interpreted any EKGs ordered by me or triage Sinus rhythm, right bundle branch block LAFB noted, there is diffuse ST changes likely consistent with right bundle branch block, there is some degree of artifact as well, no significant necessary change noted from prior however somewhat difficult  interpretation ____________________________________________  RADIOLOGY  Pertinent labs & imaging results that were available during my care of the patient were reviewed by me and considered in my medical decision making (see chart for details). If possible, patient and/or family made aware of any abnormal findings.  Dg Chest Port 1 View  Result Date: 09/08/2017 CLINICAL DATA:  Onset of shortness of breath today in a patient with a history of COPD. EXAM: PORTABLE CHEST 1 VIEW COMPARISON:  CT chest 04/01/2015.  PA and lateral chest 01/28/2012. FINDINGS: The lungs are severely emphysematous but clear. Heart size is normal. No pneumothorax or pleural effusion. Aortic atherosclerosis is noted. No acute bony abnormality. IMPRESSION: No acute disease. Severe emphysema. Atherosclerosis. Electronically Signed   By: Inge Rise M.D.   On: 09/08/2017 11:38   ____________________________________________    PROCEDURES  Procedure(s) performed: None  Procedures  Critical Care performed: None  ____________________________________________   INITIAL IMPRESSION / ASSESSMENT AND PLAN / ED COURSE  Pertinent labs & imaging results that were available during my care of the patient were reviewed by me and considered in my medical decision making (see chart for details).  Patient here with COPD flare, after nebulizers from EMS and steroids, she is feeling better she is somewhat tachycardia after these interventions.  We are giving, x-ray and blood work is reassuring, we are going to give her another nebulizer I am watching her here heart rate is still somewhat slightly elevated after the nebulizers en route, I may give her a small amount of IV fluid as she is not known to be in heart failure, there BNP is reassuring, there is no evidence or stigmata of heart failure.  Nor is there any evidence of acute infection, patient has very fragile COPD and sounds like she "overdid it today".  We will continue to  observe her closely and see if she can go safely home or whether she will need admission at age 73  ----------------------------------------- 1:55 PM on 09/08/2017 -----------------------------------------  Further intervention from [place] patient is at her baseline, heart rates a little bit high we are giving her fluids she states her heart rate is always high, she states there is no change in that.  She is adamant that she does not wish to be admitted to the hospital.  She states this is a normal day for her just got a little bit worse than normal.  She would like me to replace her albuterol nebulizers as they seem to have expired last year and she is  otherwise without complaint.  Certainly at this time, no evidence of endocarditis myocarditis pericarditis acute coronary syndrome pulmonary embolus pneumothorax pneumonia or other intrathoracic pathology of any significance, aside from her baseline and known COPD and she feels adamant that she is at her baseline.  We did have an extended discussion about admission to the hospital which given her age I did advise but she feels very strongly that she would like to go home.  I am giving her IV fluid heart rate this time is 102 which she states is good for her.  Her BUN/creatinine are slightly elevated over baseline and I suspect she has some degree of dehydration.  She also received a lot of nebulizers.  It is a sinus rhythm.  Given that the patient declines further workup, and is in no acute distress at this time, and the fact that this is a common recurrent event for her which she states happens every time she "overdoes it" at home we will send her home.  I will send her home with steroids, and return precautions and follow-up instructions.  Patient states she has end-stage COPD she knows her disease she knows her body and she does not want to be admitted to the hospital.  We will honor that obviously.  We are at bedside and agree with this.     ____________________________________________   FINAL CLINICAL IMPRESSION(S) / ED DIAGNOSES  Final diagnoses:  SOB (shortness of breath)      This chart was dictated using voice recognition software.  Despite best efforts to proofread,  errors can occur which can change meaning.      Schuyler Amor, MD 09/08/17 1250    Schuyler Amor, MD 09/08/17 1356    Schuyler Amor, MD 09/08/17 1420

## 2017-09-08 NOTE — ED Triage Notes (Signed)
Pt comes via [facility] with c/o SHOB. EMS states pt took shower and has been unable to catch her breath since. Pt wears 2.5L O2 at home. EMS gave 10 albuterol, .5 atrovent, and 125 solumedrol. Pt was tachycardic. Pt is A&OX4

## 2017-09-16 DIAGNOSIS — L209 Atopic dermatitis, unspecified: Secondary | ICD-10-CM | POA: Diagnosis not present

## 2017-09-16 DIAGNOSIS — L578 Other skin changes due to chronic exposure to nonionizing radiation: Secondary | ICD-10-CM | POA: Diagnosis not present

## 2017-09-16 DIAGNOSIS — I8311 Varicose veins of right lower extremity with inflammation: Secondary | ICD-10-CM | POA: Diagnosis not present

## 2017-09-16 DIAGNOSIS — D692 Other nonthrombocytopenic purpura: Secondary | ICD-10-CM | POA: Diagnosis not present

## 2017-09-16 DIAGNOSIS — I8312 Varicose veins of left lower extremity with inflammation: Secondary | ICD-10-CM | POA: Diagnosis not present

## 2017-09-17 ENCOUNTER — Encounter: Payer: Self-pay | Admitting: Family Medicine

## 2017-09-23 ENCOUNTER — Telehealth: Payer: Self-pay | Admitting: Internal Medicine

## 2017-09-23 NOTE — Telephone Encounter (Signed)
Per Heath Lark with Pulmonary Rehab, "pt feels like she can not afford the $10 copay per visit". Pt declined to schedule pulmonary rehab at this time.  Just FYI for physician.  Misty Ferguson

## 2017-10-03 DIAGNOSIS — R000 Tachycardia, unspecified: Secondary | ICD-10-CM | POA: Diagnosis not present

## 2017-10-04 ENCOUNTER — Telehealth: Payer: Self-pay | Admitting: Family Medicine

## 2017-10-04 ENCOUNTER — Other Ambulatory Visit (INDEPENDENT_AMBULATORY_CARE_PROVIDER_SITE_OTHER): Payer: Medicare HMO

## 2017-10-04 DIAGNOSIS — E7800 Pure hypercholesterolemia, unspecified: Secondary | ICD-10-CM

## 2017-10-04 DIAGNOSIS — M810 Age-related osteoporosis without current pathological fracture: Secondary | ICD-10-CM

## 2017-10-04 DIAGNOSIS — R809 Proteinuria, unspecified: Secondary | ICD-10-CM

## 2017-10-04 DIAGNOSIS — R7303 Prediabetes: Secondary | ICD-10-CM

## 2017-10-04 LAB — COMPREHENSIVE METABOLIC PANEL
ALBUMIN: 4.6 g/dL (ref 3.5–5.2)
ALK PHOS: 47 U/L (ref 39–117)
ALT: 16 U/L (ref 0–35)
AST: 23 U/L (ref 0–37)
BILIRUBIN TOTAL: 0.6 mg/dL (ref 0.2–1.2)
BUN: 19 mg/dL (ref 6–23)
CALCIUM: 10.7 mg/dL — AB (ref 8.4–10.5)
CO2: 31 meq/L (ref 19–32)
CREATININE: 0.94 mg/dL (ref 0.40–1.20)
Chloride: 99 mEq/L (ref 96–112)
GFR: 60.69 mL/min (ref >60.00)
Glucose, Bld: 103 mg/dL — ABNORMAL HIGH (ref 70–99)
Potassium: 5 mEq/L (ref 3.5–5.1)
Sodium: 139 mEq/L (ref 135–145)
TOTAL PROTEIN: 6.7 g/dL (ref 6.0–8.3)

## 2017-10-04 LAB — LIPID PANEL
CHOL/HDL RATIO: 2
CHOLESTEROL: 298 mg/dL — AB (ref 0–200)
HDL: 165.2 mg/dL (ref >39.00)
LDL Cholesterol: 118 mg/dL — ABNORMAL HIGH (ref 0–99)
NonHDL: 132.92
TRIGLYCERIDES: 75 mg/dL (ref 0.0–149.0)
VLDL: 15 mg/dL (ref 0.0–40.0)

## 2017-10-04 LAB — VITAMIN D 25 HYDROXY (VIT D DEFICIENCY, FRACTURES): VITD: 41.63 ng/mL (ref 30.00–100.00)

## 2017-10-04 LAB — HEMOGLOBIN A1C: Hgb A1c MFr Bld: 5.9 % (ref 4.6–6.5)

## 2017-10-04 NOTE — Telephone Encounter (Signed)
-----   Message from Ellamae Sia sent at 09/24/2017  4:21 PM EST ----- Regarding: Lab orders for Friday, 2.22.19 Patient is scheduled for CPX labs, please order future labs, Thanks , Karna Christmas

## 2017-10-09 DIAGNOSIS — R0602 Shortness of breath: Secondary | ICD-10-CM | POA: Diagnosis not present

## 2017-10-09 DIAGNOSIS — Z87898 Personal history of other specified conditions: Secondary | ICD-10-CM | POA: Diagnosis not present

## 2017-10-11 ENCOUNTER — Encounter: Payer: Self-pay | Admitting: Family Medicine

## 2017-10-11 ENCOUNTER — Ambulatory Visit (INDEPENDENT_AMBULATORY_CARE_PROVIDER_SITE_OTHER): Payer: Medicare HMO | Admitting: Family Medicine

## 2017-10-11 ENCOUNTER — Ambulatory Visit: Payer: Medicare HMO

## 2017-10-11 VITALS — BP 122/84 | HR 91 | Temp 98.0°F | Ht 61.0 in | Wt 115.5 lb

## 2017-10-11 DIAGNOSIS — I10 Essential (primary) hypertension: Secondary | ICD-10-CM | POA: Diagnosis not present

## 2017-10-11 DIAGNOSIS — Z0000 Encounter for general adult medical examination without abnormal findings: Secondary | ICD-10-CM | POA: Diagnosis not present

## 2017-10-11 DIAGNOSIS — J432 Centrilobular emphysema: Secondary | ICD-10-CM

## 2017-10-11 DIAGNOSIS — E7800 Pure hypercholesterolemia, unspecified: Secondary | ICD-10-CM | POA: Diagnosis not present

## 2017-10-11 DIAGNOSIS — J9611 Chronic respiratory failure with hypoxia: Secondary | ICD-10-CM | POA: Diagnosis not present

## 2017-10-11 NOTE — Progress Notes (Signed)
PCP notes:   Health maintenance:  Foot exam - PCP please address at next appt Eye exam - per pt, exam approx 3 mths ago  Abnormal screenings:   Hearing - failed  Hearing Screening   125Hz  250Hz  500Hz  1000Hz  2000Hz  3000Hz  4000Hz  6000Hz  8000Hz   Right ear:   40 40 40  0    Left ear:   40 40 40  0      Patient concerns:   None  Nurse concerns:  None  Next PCP appt:   10/11/2017 @ 1400

## 2017-10-11 NOTE — Assessment & Plan Note (Signed)
Not at goal.. Pt interested in statin at this time. Will discuss with her cardiologist.

## 2017-10-11 NOTE — Assessment & Plan Note (Signed)
2L [place] continuous.

## 2017-10-11 NOTE — Progress Notes (Signed)
Subjective:    Patient ID: Misty Ferguson, female    DOB: 10/17/1933, 84 y.o.   MRN: [id number]  HPI  The patient presents for  complete physical and review of chronic health problems.   The patient saw Candis Musa, LPN for medicare wellness. Note reviewed in detail and important notes copied below. No concerns.   Neg PHQ9, fall screen, nml hearing screen.  Severe COPD, on continuous oxygen: Followed by Pulm. Dr. Humphrey Rolls  recent ER visit 09/08/2017 Dx with COPD exacerbation.  Had follow up 10/09/2017 for tachycardia  With cardiologist, Dr. Ubaldo Glassing. Placed holter monitor 48 hours. She has been notin HR 115-125... Even with slight movement. Increased shortness of breath in last few month. Has ECHO scheduled.  Hypertension:   At goal on losartan BP Readings from Last 3 Encounters:  10/11/17 122/84  10/11/17 122/84  09/08/17 (!) 145/64  Using medication without problems or lightheadedness: none Chest pain with exertion: none Edema:none Short of breath: YES Average home BPs: Other issues:  Elevated Cholesterol: She is high risk for CVD, but is not interested in a statin. Lab Results  Component Value Date   CHOL 298 (H) 10/04/2017   HDL 165.20 10/04/2017   LDLCALC 118 (H) 10/04/2017   LDLDIRECT 99.1 09/08/2013   TRIG 75.0 10/04/2017   CHOLHDL 2 10/04/2017  Using medications without problems: Muscle aches:  Diet compliance: good Exercise:none given recent issues. No longer able to do treadmill. Other complaints:   Social History /Family History/Past Medical History reviewed in detail and updated in EMR if needed. Blood pressure 122/84, pulse 91, temperature 98 F (36.7 C), temperature source Oral, height 5\' 1"  (1.549 m), weight 115 lb 8 oz (52.4 kg), SpO2 94 %.  Review of Systems  Constitutional: Positive for fatigue. Negative for fever.  HENT: Negative for congestion.   Eyes: Negative for pain.  Respiratory: Positive for shortness of breath and wheezing. Negative for  cough.   Cardiovascular: Positive for palpitations. Negative for chest pain and leg swelling.  Gastrointestinal: Negative for abdominal pain.  Genitourinary: Negative for dysuria and vaginal bleeding.  Musculoskeletal: Negative for back pain.  Neurological: Negative for syncope, light-headedness and headaches.  Psychiatric/Behavioral: Negative for dysphoric mood.       Objective:   Physical Exam  Constitutional: Vital signs are normal. She appears well-developed and well-nourished. She is cooperative.  Non-toxic appearance. She does not appear ill. No distress.  HENT:  Head: Normocephalic.  Right Ear: Hearing, tympanic membrane, external ear and ear canal normal.  Left Ear: Hearing, tympanic membrane, external ear and ear canal normal.  Nose: Nose normal.  Eyes: Conjunctivae, EOM and lids are normal. Pupils are equal, round, and reactive to light. Lids are everted and swept, no foreign bodies found.  Neck: Trachea normal and normal range of motion. Neck supple. Carotid bruit is not present. No thyroid mass and no thyromegaly present.  Cardiovascular: Normal rate, regular rhythm, S1 normal, S2 normal, normal heart sounds and intact distal pulses. Exam reveals no gallop.  No murmur heard. Pulmonary/Chest: Effort normal. No respiratory distress. She has decreased breath sounds. She has no wheezes. She has no rhonchi. She has no rales.  Tympanic sound to lungs, decreased air movement.  Abdominal: Soft. Normal appearance and bowel sounds are normal. She exhibits no distension, no fluid wave, no abdominal bruit and no mass. There is no hepatosplenomegaly. There is no tenderness. There is no rebound, no guarding and no CVA tenderness. No hernia.  Lymphadenopathy:    She  has no cervical adenopathy.    She has no axillary adenopathy.  Neurological: She is alert. She has normal strength. No cranial nerve deficit or sensory deficit.  Skin: Skin is warm, dry and intact. No rash noted.  Psychiatric:  Her speech is normal and behavior is normal. Judgment normal. Her mood appears not anxious. Cognition and memory are normal. She does not exhibit a depressed mood.          Assessment & Plan:  The patient's preventative maintenance and recommended screening tests for an annual wellness exam were reviewed in full today. Brought up to date unless services declined.  Counselled on the importance of diet, exercise, and its role in overall health and mortality. The patient's FH and SH was reviewed, including their home life, tobacco status, and drug and alcohol status.   Vaccines:Uptodate Mammogram: last in 2009, not interested in continuing to follow.  DEXA: on fosamax (many years about 10 years) last checked 2009 t -3.11.Marland Kitchen Stopped fosamax 2012,  05/2016 femoral neck  -2.7  Plan recheck in 2 years.. If worsening will restart fosamax. Colon: Not interested in colon cancer screening. Discussed the risks and benefits of cancer screening... Pt voices understanding an chooses to not proceed with prevention as noted above.  " I am ready to go if the lord takes me" No indication for pap/DVE.

## 2017-10-11 NOTE — Assessment & Plan Note (Signed)
Tolerable control per pt. Followed by pulmonary.

## 2017-10-11 NOTE — Patient Instructions (Signed)
Misty Ferguson , Thank you for taking time to come for your Medicare Wellness Visit. I appreciate your ongoing commitment to your health goals. Please review the following plan we discussed and let me know if I can assist you in the future.   These are the goals we discussed: Goals    . Increase physical activity     Starting 10/11/2017, I will continue to walk on treadmill for 15-20 minutes 5 days per week.        This is a list of the screening recommended for you and due dates:  Health Maintenance  Topic Date Due  . Hemoglobin A1C  04/03/2018  . Eye exam for diabetics  07/13/2018  . Complete foot exam   10/12/2018  . Tetanus Vaccine  10/04/2026  . Flu Shot  Completed  . DEXA scan (bone density measurement)  Completed  . Pneumonia vaccines  Completed   Preventive Care for Adults  A healthy lifestyle and preventive care can promote health and wellness. Preventive health guidelines for adults include the following key practices.  . A routine yearly physical is a good way to check with your health care provider about your health and preventive screening. It is a chance to share any concerns and updates on your health and to receive a thorough exam.  . Visit your dentist for a routine exam and preventive care every 6 months. Brush your teeth twice a day and floss once a day. Good oral hygiene prevents tooth decay and gum disease.  . The frequency of eye exams is based on your age, health, family medical history, use  of contact lenses, and other factors. Follow your health care provider's recommendations for frequency of eye exams.  . Eat a healthy diet. Foods like vegetables, fruits, whole grains, low-fat dairy products, and lean protein foods contain the nutrients you need without too many calories. Decrease your intake of foods high in solid fats, added sugars, and salt. Eat the right amount of calories for you. Get information about a proper diet from your health care provider, if  necessary.  . Regular physical exercise is one of the most important things you can do for your health. Most adults should get at least 150 minutes of moderate-intensity exercise (any activity that increases your heart rate and causes you to sweat) each week. In addition, most adults need muscle-strengthening exercises on 2 or more days a week.  Silver Sneakers may be a benefit available to you. To determine eligibility, you may visit the website: www.silversneakers.com or contact program at 410-113-0195 Mon-Fri between 8AM-8PM.   . Maintain a healthy weight. The body mass index (BMI) is a screening tool to identify possible weight problems. It provides an estimate of body fat based on height and weight. Your health care provider can find your BMI and can help you achieve or maintain a healthy weight.   For adults 20 years and older: ? A BMI below 18.5 is considered underweight. ? A BMI of 18.5 to 24.9 is normal. ? A BMI of 25 to 29.9 is considered overweight. ? A BMI of 30 and above is considered obese.   . Maintain normal blood lipids and cholesterol levels by exercising and minimizing your intake of saturated fat. Eat a balanced diet with plenty of fruit and vegetables. Blood tests for lipids and cholesterol should begin at age 13 and be repeated every 5 years. If your lipid or cholesterol levels are high, you are over 50, or you are at  high risk for heart disease, you may need your cholesterol levels checked more frequently. Ongoing high lipid and cholesterol levels should be treated with medicines if diet and exercise are not working.  . If you smoke, find out from your health care provider how to quit. If you do not use tobacco, please do not start.  . If you choose to drink alcohol, please do not consume more than 2 drinks per day. One drink is considered to be 12 ounces (355 mL) of beer, 5 ounces (148 mL) of wine, or 1.5 ounces (44 mL) of liquor.  . If you are 37-25 years old, ask your  health care provider if you should take aspirin to prevent strokes.  . Use sunscreen. Apply sunscreen liberally and repeatedly throughout the day. You should seek shade when your shadow is shorter than you. Protect yourself by wearing long sleeves, pants, a wide-brimmed hat, and sunglasses year round, whenever you are outdoors.  . Once a month, do a whole body skin exam, using a mirror to look at the skin on your back. Tell your health care provider of new moles, moles that have irregular borders, moles that are larger than a pencil eraser, or moles that have changed in shape or color.

## 2017-10-11 NOTE — Patient Instructions (Addendum)
Stop calcium supplement. Switch to vit D3 400 IU 1-2 times daily.  Exercise as much as tolerated.

## 2017-10-11 NOTE — Progress Notes (Signed)
I reviewed health advisor's note, was available for consultation, and agree with documentation and plan.  

## 2017-10-11 NOTE — Progress Notes (Signed)
Subjective:   Misty Ferguson is a 84 y.o. female who presents for Medicare Annual (Subsequent) preventive examination.  Review of Systems: N/A Cardiac Risk Factors include: advanced age (>20men, >78 women);dyslipidemia;hypertension     Objective:     Vitals: BP 122/84 (BP Location: Left Arm, Patient Position: Sitting, Cuff Size: Normal)   Pulse 91   Temp 98 F (36.7 C) (Oral)   Ht 5\' 1"  (1.549 m) Comment: no shoes  Wt 115 lb 8 oz (52.4 kg)   SpO2 94%   BMI 21.82 kg/m   Body mass index is 21.82 kg/m.  Advanced Directives 10/11/2017 09/08/2017 10/04/2016 10/04/2016  Does Patient Have a Medical Advance Directive? Yes Yes (No Data) Yes  Type of Scientist, forensic Power of [place];Living will Out of facility DNR (pink MOST or yellow form) - [place];Living will  Copy of [place] in Chart? No - copy requested - - No - copy requested    Tobacco Social History   Tobacco Use  Smoking Status Former Smoker  . Packs/day: 1.30  . Years: 45.00  . Pack years: 58.50  . Types: Cigarettes  . Last attempt to quit: 08/14/2003  . Years since quitting: 14.1  Smokeless Tobacco Never Used     Counseling given: No   Clinical Intake:  Pre-visit preparation completed: Yes  Pain : No/denies pain Pain Score: 0-No pain     Nutritional Status: BMI of 19-24  Normal Nutritional Risks: None Diabetes: No  How often do you need to have someone help you when you read instructions, pamphlets, or other written materials from your doctor or pharmacy?: 1 - Never What is the last grade level you completed in school?: 12th grade + 2 yrs college  Interpreter Needed?: No  Comments: pt lives with spouse Information entered by :: LPinson, LPN  Past Medical History:  Diagnosis Date  . Chronic airway obstruction, not elsewhere classified   . Macular degeneration (senile) of retina, unspecified   . Obstructive chronic bronchitis with exacerbation  ([place])   . Osteoporosis, unspecified   . Personal history of peptic ulcer disease   . Tobacco use disorder   . Type II or unspecified type diabetes mellitus without mention of complication, not stated as uncontrolled   . Unspecified disorder resulting from impaired renal function   . Unspecified essential hypertension   . Unspecified glaucoma(365.9)    History reviewed. No pertinent surgical history. History reviewed. No pertinent family history. Social History   Socioeconomic History  . Marital status: Married    Spouse name: None  . Number of children: 3  . Years of education: None  . Highest education level: None  Social Needs  . Financial resource strain: None  . Food insecurity - worry: None  . Food insecurity - inability: None  . Transportation needs - medical: None  . Transportation needs - non-medical: None  Occupational History  . Occupation: RETIRED    Employer: RETIRED  Tobacco Use  . Smoking status: Former Smoker    Packs/day: 1.30    Years: 45.00    Pack years: 58.50    Types: Cigarettes    Last attempt to quit: 08/14/2003    Years since quitting: 14.1  . Smokeless tobacco: Never Used  Substance and Sexual Activity  . Alcohol use: Yes    Comment: Occasional  . Drug use: No  . Sexual activity: Yes  Other Topics Concern  . None  Social History Narrative   Tobacco  abuse x 45 years    Outpatient Encounter Medications as of 10/11/2017  Medication Sig  . albuterol (ACCUNEB) 1.25 MG/3ML nebulizer solution Take 3 mLs (1.25 mg total) by nebulization every 6 (six) hours as needed.  Marland Kitchen albuterol (PROVENTIL HFA;VENTOLIN HFA) 108 (90 Base) MCG/ACT inhaler Inhale 2 puffs into the lungs every 6 (six) hours as needed for wheezing or shortness of breath.  . Ascorbic Acid (VITAMIN C PO) Take 1 tablet by mouth daily.  Marland Kitchen aspirin 81 MG tablet Take 81 mg by mouth at bedtime.   Marland Kitchen azithromycin (ZITHROMAX) 250 MG tablet TAKE 1 TABLET EVERY DAY  . Calcium Carbonate-Vitamin D  (CALCIUM 600/VITAMIN D) 600-400 MG-UNIT per tablet Take 1 tablet by mouth 2 (two) times daily.    Marland Kitchen losartan (COZAAR) 50 MG tablet TAKE 1 TABLET EVERY DAY  . Multiple Vitamins-Minerals (PRESERVISION AREDS) TABS Take 1 tablet by mouth 2 (two) times daily.  . Omega-3 Fatty Acids (FISH OIL) 1000 MG CAPS Take 1 capsule by mouth 2 (two) times daily.   Marland Kitchen STIOLTO RESPIMAT 2.5-2.5 MCG/ACT AERS INHALE 2 PUFFS INTO THE LUNGS EVERY DAY  . [DISCONTINUED] betamethasone dipropionate (DIPROLENE) 0.05 % cream Apply topically 2 (two) times daily. (Patient not taking: Reported on 09/08/2017)  . [DISCONTINUED] gabapentin (NEURONTIN) 300 MG capsule TAKE 1 CAPSULE AT BEDTIME (Patient not taking: Reported on 09/08/2017)  . [DISCONTINUED] predniSONE (DELTASONE) 20 MG tablet Take 3 tablets (60 mg total) by mouth daily.  . [DISCONTINUED] triamcinolone cream (KENALOG) 0.1 % Apply 1 application topically 2 (two) times daily. Use up to 5 days per week. Avoid face, groin and underarms.   No facility-administered encounter medications on file as of 10/11/2017.     Activities of Daily Living In your present state of health, do you have any difficulty performing the following activities: 10/11/2017  Hearing? N  Vision? Y  Comment macular degeneration  Difficulty concentrating or making decisions? N  Walking or climbing stairs? Y  Dressing or bathing? N  Doing errands, shopping? Y  Preparing Food and eating ? N  Using the Toilet? N  In the past six months, have you accidently leaked urine? N  Do you have problems with loss of bowel control? N  Managing your Medications? N  Managing your Finances? N  Housekeeping or managing your Housekeeping? N  Some recent data might be hidden    Patient Care Team: [name], MD as PCP - General Flora Lipps, MD as Consulting Physician (Pulmonary Disease) Birder Robson, MD as Referring Physician (Ophthalmology) Teodoro Spray, MD as Consulting Physician (Cardiology)      Assessment:   This is a routine wellness examination for Misty Ferguson.  Exercise Activities and Dietary recommendations Current Exercise Habits: Home exercise routine, Type of exercise: treadmill, Time (Minutes): 20, Frequency (Times/Week): 5, Weekly Exercise (Minutes/Week): 100, Intensity: Mild, Exercise limited by: respiratory conditions(s)  Goals    . Increase physical activity     Starting 10/11/2017, I will continue to walk on treadmill for 15-20 minutes 5 days per week.        Fall Risk Fall Risk  10/11/2017 10/04/2016 09/22/2015 09/21/2014 09/18/2013  Falls in the past year? No Yes Yes No No  Comment - pt reported fall in bedroom; no injury - - -  Number falls in past yr: - 1 1 - -  Injury with Fall? - No Yes - -    Depression Screen PHQ 2/9 Scores 10/11/2017 10/04/2016 09/22/2015 09/21/2014  PHQ - 2 Score 0 0  0 0  PHQ- 9 Score 0 - - -     Cognitive Function MMSE - Mini Mental State Exam 10/11/2017 10/04/2016  Orientation to time 5 5  Orientation to Place 5 5  Registration 3 3  Attention/ Calculation 0 0  Recall 3 3  Language- name 2 objects 0 0  Language- repeat 1 1  Language- follow 3 step command 3 3  Language- read & follow direction 0 0  Write a sentence 0 0  Copy design 0 0  Total score 20 20       PLEASE NOTE: A Mini-Cog screen was completed. Maximum score is 20. A value of 0 denotes this part of [place] MMSE was not completed or the patient failed this part of the Mini-Cog screening.   Mini-Cog Screening Orientation to Time - Max 5 pts Orientation to Place - Max 5 pts Registration - Max 3 pts Recall - Max 3 pts Language Repeat - Max 1 pts Language Follow 3 Step Command - Max 3 pts   Immunization History  Administered Date(s) Administered  . H1N1 10/04/2008  . Influenza Split 04/21/2011, 05/12/2012  . Influenza Whole 05/27/2007, 05/08/2008, 05/04/2009, 04/26/2010  . Influenza,inj,Quad PF,6+ Mos 05/06/2013, 05/06/2014, 04/21/2015, 05/11/2016, 04/17/2017  .  Pneumococcal Conjugate-13 09/21/2014  . Pneumococcal Polysaccharide-23 03/13/2006, 06/28/2011  . Td 03/13/2006  . Tdap 10/04/2016  . Zoster 09/05/2010    Screening Tests Health Maintenance  Topic Date Due  . HEMOGLOBIN A1C  04/03/2018  . OPHTHALMOLOGY EXAM  07/13/2018  . FOOT EXAM  10/12/2018  . TETANUS/TDAP  10/04/2026  . INFLUENZA VACCINE  Completed  . DEXA SCAN  Completed  . PNA vac Low Risk Adult  Completed       Plan:     I have personally reviewed, addressed, and noted the following in the patient's chart:  A. Medical and social history B. Use of alcohol, tobacco or illicit drugs  C. Current medications and supplements D. Functional ability and status E.  Nutritional status F.  Physical activity G. Advance directives H. List of other physicians I.  Hospitalizations, surgeries, and ER visits in previous 12 months J.  [place] to include hearing, vision, cognitive, depression L. Referrals and appointments - none  In addition, I have reviewed and discussed with patient certain preventive protocols, quality metrics, and best practice recommendations. A written personalized care plan for preventive services as well as general preventive health recommendations were provided to patient.  See attached scanned questionnaire for additional information.   Signed,   Lindell Noe, MHA, BS, LPN Health Coach

## 2017-10-11 NOTE — Assessment & Plan Note (Signed)
Well controlled. Continue current medication.  

## 2017-10-16 DIAGNOSIS — R0602 Shortness of breath: Secondary | ICD-10-CM | POA: Diagnosis not present

## 2017-10-17 ENCOUNTER — Encounter: Payer: Self-pay | Admitting: Family Medicine

## 2017-10-22 ENCOUNTER — Emergency Department: Payer: Medicare HMO

## 2017-10-22 ENCOUNTER — Other Ambulatory Visit: Payer: Self-pay

## 2017-10-22 ENCOUNTER — Encounter: Payer: Self-pay | Admitting: Emergency Medicine

## 2017-10-22 ENCOUNTER — Observation Stay
Admission: EM | Admit: 2017-10-22 | Discharge: 2017-10-25 | Disposition: A | Discharge status: Skilled Nursing Facility | Payer: Medicare HMO | Attending: Specialist | Admitting: Specialist

## 2017-10-22 DIAGNOSIS — R0789 Other chest pain: Secondary | ICD-10-CM | POA: Diagnosis not present

## 2017-10-22 DIAGNOSIS — S76112A Strain of left quadriceps muscle, fascia and tendon, initial encounter: Secondary | ICD-10-CM | POA: Insufficient documentation

## 2017-10-22 DIAGNOSIS — R55 Syncope and collapse: Principal | ICD-10-CM | POA: Insufficient documentation

## 2017-10-22 DIAGNOSIS — Z8711 Personal history of peptic ulcer disease: Secondary | ICD-10-CM | POA: Diagnosis not present

## 2017-10-22 DIAGNOSIS — Z7982 Long term (current) use of aspirin: Secondary | ICD-10-CM | POA: Insufficient documentation

## 2017-10-22 DIAGNOSIS — R079 Chest pain, unspecified: Secondary | ICD-10-CM

## 2017-10-22 DIAGNOSIS — R262 Difficulty in walking, not elsewhere classified: Secondary | ICD-10-CM | POA: Insufficient documentation

## 2017-10-22 DIAGNOSIS — S032XXA Dislocation of tooth, initial encounter: Secondary | ICD-10-CM | POA: Insufficient documentation

## 2017-10-22 DIAGNOSIS — E119 Type 2 diabetes mellitus without complications: Secondary | ICD-10-CM | POA: Insufficient documentation

## 2017-10-22 DIAGNOSIS — W19XXXA Unspecified fall, initial encounter: Secondary | ICD-10-CM | POA: Diagnosis not present

## 2017-10-22 DIAGNOSIS — I10 Essential (primary) hypertension: Secondary | ICD-10-CM | POA: Diagnosis not present

## 2017-10-22 DIAGNOSIS — Z79899 Other long term (current) drug therapy: Secondary | ICD-10-CM | POA: Diagnosis not present

## 2017-10-22 DIAGNOSIS — Z66 Do not resuscitate: Secondary | ICD-10-CM | POA: Diagnosis not present

## 2017-10-22 DIAGNOSIS — S0990XA Unspecified injury of head, initial encounter: Secondary | ICD-10-CM | POA: Diagnosis not present

## 2017-10-22 DIAGNOSIS — S8992XA Unspecified injury of left lower leg, initial encounter: Secondary | ICD-10-CM | POA: Diagnosis not present

## 2017-10-22 DIAGNOSIS — Z87891 Personal history of nicotine dependence: Secondary | ICD-10-CM | POA: Diagnosis not present

## 2017-10-22 DIAGNOSIS — J449 Chronic obstructive pulmonary disease, unspecified: Secondary | ICD-10-CM | POA: Insufficient documentation

## 2017-10-22 DIAGNOSIS — M25552 Pain in left hip: Secondary | ICD-10-CM | POA: Diagnosis not present

## 2017-10-22 DIAGNOSIS — M79605 Pain in left leg: Secondary | ICD-10-CM | POA: Diagnosis not present

## 2017-10-22 DIAGNOSIS — H409 Unspecified glaucoma: Secondary | ICD-10-CM | POA: Diagnosis not present

## 2017-10-22 LAB — CBC
HCT: 37.5 % (ref 35.0–47.0)
HEMOGLOBIN: 12.5 g/dL (ref 12.0–16.0)
MCH: 31.7 pg (ref 26.0–34.0)
MCHC: 33.4 g/dL (ref 32.0–36.0)
MCV: 95.1 fL (ref 80.0–100.0)
PLATELETS: 214 10*3/uL (ref 150–440)
RBC: 3.95 MIL/uL (ref 3.80–5.20)
RDW: 13.3 % (ref 11.5–14.5)
WBC: 6.2 10*3/uL (ref 3.6–11.0)

## 2017-10-22 LAB — TROPONIN I

## 2017-10-22 LAB — COMPREHENSIVE METABOLIC PANEL
ALK PHOS: 48 U/L (ref 38–126)
ALT: 21 U/L (ref 14–54)
ANION GAP: 12 (ref 5–15)
AST: 29 U/L (ref 15–41)
Albumin: 4.3 g/dL (ref 3.5–5.0)
BUN: 18 mg/dL (ref 6–20)
CALCIUM: 9.4 mg/dL (ref 8.9–10.3)
CO2: 24 mmol/L (ref 22–32)
CREATININE: 0.9 mg/dL (ref 0.44–1.00)
Chloride: 103 mmol/L (ref 101–111)
GFR, EST NON AFRICAN AMERICAN: 58 mL/min — AB (ref >60)
Glucose, Bld: 109 mg/dL — ABNORMAL HIGH (ref 65–99)
Potassium: 4.2 mmol/L (ref 3.5–5.1)
SODIUM: 139 mmol/L (ref 135–145)
TOTAL PROTEIN: 6.8 g/dL (ref 6.5–8.1)
Total Bilirubin: 0.8 mg/dL (ref 0.3–1.2)

## 2017-10-22 NOTE — ED Provider Notes (Addendum)
[facility] Emergency Department Provider Note ____________________________________________   First MD Initiated Contact with Patient 10/22/17 2215     (approximate)  I have reviewed the triage vital signs and the nursing notes.   HISTORY  Chief Complaint Fall; Hip Pain; and Facial Injury    HPI AWA BACHICHA is a 84 y.o. female with past medical history as noted below who presents with left hip pain, acute onset after a fall several hours ago, and associated with difficulty bearing weight.  The patient states that she was in the kitchen, when she suddenly lost her balance and thinks she may have lost consciousness for very brief moment.  She also hit her face on the ground which caused her to lose her right front tooth.  She denies other injuries besides the facial injury and the left hip and femur pain.  She denies feeling dizzy, lightheaded, or weak prior to the fall.   Past Medical History:  Diagnosis Date  . Chronic airway obstruction, not elsewhere classified   . Macular degeneration (senile) of retina, unspecified   . Obstructive chronic bronchitis with exacerbation ([place])   . Osteoporosis, unspecified   . Personal history of peptic ulcer disease   . Tobacco use disorder   . Type II or unspecified type diabetes mellitus without mention of complication, not stated as uncontrolled   . Unspecified disorder resulting from impaired renal function   . Unspecified essential hypertension   . Unspecified glaucoma(365.9)     Patient Active Problem List   Diagnosis Date Noted  . Rash 03/22/2017  . Senile purpura ([place]) 10/04/2016  . Peripheral neuropathy 07/24/2016  . Counseling regarding end of life decision making 09/21/2014  . Allergic rhinitis 12/23/2012  . Chronic hypoxemic respiratory failure ([place]) 08/25/2012  . MICROALBUMINURIA 04/26/2010  . HYPERCHOLESTEROLEMIA 03/24/2008  . Prediabetes 12/11/2006  . MACULAR DEGENERATION 12/10/2006  .  GLAUCOMA 12/10/2006  . Essential hypertension, benign 12/10/2006  . COPD (chronic obstructive pulmonary disease) with emphysema ([place]) 12/10/2006  . Osteoporosis 12/10/2006    History reviewed. No pertinent surgical history.  Prior to Admission medications   Medication Sig Start Date End Date Taking? Authorizing Provider  albuterol (ACCUNEB) 1.25 MG/3ML nebulizer solution Take 3 mLs (1.25 mg total) by nebulization every 6 (six) hours as needed. 09/08/17   Schuyler Amor, MD  albuterol (PROVENTIL HFA;VENTOLIN HFA) 108 (90 Base) MCG/ACT inhaler Inhale 2 puffs into the lungs every 6 (six) hours as needed for wheezing or shortness of breath. 08/23/17   Laverle Hobby, MD  Ascorbic Acid (VITAMIN C PO) Take 1 tablet by mouth daily.    [provider]  aspirin 81 MG tablet Take 81 mg by mouth at bedtime.     [provider]  azithromycin (ZITHROMAX) 250 MG tablet TAKE 1 TABLET EVERY DAY 05/28/17   Flora Lipps, MD  Calcium Carbonate-Vitamin D (CALCIUM 600/VITAMIN D) 600-400 MG-UNIT per tablet Take 1 tablet by mouth 2 (two) times daily.      [provider]  losartan (COZAAR) 50 MG tablet TAKE 1 TABLET EVERY DAY 08/23/17   [name], Amy E, MD  Multiple Vitamins-Minerals (PRESERVISION AREDS) TABS Take 1 tablet by mouth 2 (two) times daily.    [provider]  Omega-3 Fatty Acids (FISH OIL) 1000 MG CAPS Take 1 capsule by mouth 2 (two) times daily.     [provider]  STIOLTO RESPIMAT 2.5-2.5 MCG/ACT AERS INHALE 2 PUFFS INTO THE LUNGS EVERY DAY 08/23/17   Flora Lipps,  MD    Allergies Patient has no known allergies.  History reviewed. No pertinent family history.  Social History Social History   Tobacco Use  . Smoking status: Former Smoker    Packs/day: 1.30    Years: 45.00    Pack years: 58.50    Types: Cigarettes    Last attempt to quit: 08/14/2003    Years since quitting: 14.2  . Smokeless tobacco: Never Used  Substance Use Topics  .  Alcohol use: Yes    Comment: Occasional  . Drug use: No    Review of Systems  Constitutional: No fever.  No weakness. Eyes: No eye injury. ENT: No neck pain. Cardiovascular: Denies chest pain. Respiratory: Denies shortness of breath. Gastrointestinal: No abdominal pain.  Genitourinary: Negative for flank pain.  Musculoskeletal: Negative for back pain.  Positive for left hip pain. Skin: Negative for rash. Neurological: Negative for headache.   ____________________________________________   PHYSICAL EXAM:  VITAL SIGNS: ED Triage Vitals  Enc Vitals Group     BP 10/22/17 2114 (!) 154/105     Pulse Rate 10/22/17 2114 72     Resp 10/22/17 2114 16     Temp 10/22/17 2114 97.7 F (36.5 C)     Temp Source 10/22/17 2114 Oral     SpO2 10/22/17 2114 96 %     Weight 10/22/17 2115 112 lb (50.8 kg)     Height 10/22/17 2115 5\' 1"  (1.549 m)     Head Circumference --      Peak Flow --      Pain Score 10/22/17 2115 3     Pain Loc --      Pain Edu? --      Excl. in [place]? --     Constitutional: Alert and oriented.  Relatively well appearing and in no acute distress. Eyes: Conjunctivae are normal.  EOMI.  PERRLA. Head: Right upper front tooth avulsed.  Small amount of dried blood around the lips.  No other head or facial trauma. Nose: No congestion/rhinnorhea. Mouth/Throat: Mucous membranes are moist.   Neck: Normal range of motion.  No midline cervical spinal tenderness. Cardiovascular: Good peripheral circulation. Respiratory: Normal respiratory effort.   Gastrointestinal: Soft and nontender.  Genitourinary: No flank tenderness. Musculoskeletal: No lower extremity edema.  Extremities warm and well perfused.  Pain on range of motion of left hip or palpation of left lateral hip and lateral thigh.  No obvious deformity.  No pain on R OM of left knee or ankle. Neurologic: Motor and sensory intact in all extremities.  Normal coordination. Skin:  Skin is warm and dry. No rash  noted. Psychiatric: Mood and affect are normal. Speech and behavior are normal.  ____________________________________________   LABS (all labs ordered are listed, but only abnormal results are displayed)  Labs Reviewed  COMPREHENSIVE METABOLIC PANEL - Abnormal; Notable for the following components:      Result Value   Glucose, Bld 109 (*)    GFR calc non Af Amer 58 (*)    All other components within normal limits  CBC  TROPONIN I   ____________________________________________  EKG   ____________________________________________  RADIOLOGY  CT head: No ICH CT maxillofacial: Avulsion of tooth 8 with no fractures XR left femur: No acute fracture XR left hip: No acute fracture XR pelvis: No acute fracture MR left hip: Pending  ____________________________________________   PROCEDURES  Procedure(s) performed: No  Procedures  Critical Care performed: No ____________________________________________   INITIAL IMPRESSION / ASSESSMENT AND PLAN / ED  COURSE  Pertinent labs & imaging results that were available during my care of the patient were reviewed by me and considered in my medical decision making (see chart for details).  84 year old female presents after a fall with facial and left hip injuries.  She denies any prodrome of weakness or lightheadedness, but feels that she likely lost her balance.  She states that she may have briefly lost consciousness during or immediately leading into the fall, but she denies any palpitations, difficulty breathing, or other syncopal type symptoms.  On exam, vital signs are normal except for hypertension, and the remainder the exam is as described above.  She has avulsed her right upper front tooth and has pain on range of motion of the left hip, with no other apparent injuries.  1.  Dental avulsion: The patient presented over 2 hours after the fall.  The tooth was preserved in saline when she arrived, but given the duration elapsed  since the injury, the chances of salvaging the tooth by trying to replace it are very low.  I do not think that the patient would benefit from attempted replacement of the tooth at this time and it would likely only cause further potential complications including infection.  The patient agrees with this plan.  She can follow-up with dental as an outpatient for possible bridge or implant.  2.  Left hip injury: The patient has significant pain on range of motion.  There is no apparent deformity, but she is also unable to bear weight.  X-ray is negative.  Will obtain an MRI to rule out occult fracture.  The patient has expressed a strong desire to be discharged home if there is no fracture, even if she is having difficulty ambulating.  She states she has important appointments tomorrow which she must attend to.  She agrees to stay if she ends up having a fracture.  If the MRI is negative, we will attempt a trial of ambulation, and then make a disposition based on the patient's wishes at that time.  I am signing the patient out to the oncoming physician Dr. Owens Shark with plan to follow up on the MRI results and reassess the patient.      ____________________________________________   FINAL CLINICAL IMPRESSION(S) / ED DIAGNOSES  Final diagnoses:  Left hip pain  Tooth avulsion, initial encounter      NEW MEDICATIONS STARTED DURING THIS VISIT:  New Prescriptions   No medications on file     Note:  This document was prepared using Dragon voice recognition software and may include unintentional dictation errors.    Arta Silence, MD 10/23/17 Phillip Heal    Arta Silence, MD 11/04/17 1329

## 2017-10-22 NOTE — ED Notes (Signed)
Pt went to CT and X-Ray  

## 2017-10-22 NOTE — ED Triage Notes (Signed)
Pt arrived to the ED via [facility] from home for a fall. Pt states the she was in the kitchen when she lost her balance and fell. Pt reports hitting her face which made her loose a tooth and hurting her hip. Pt is AOx4 in no apparent distress with left hip pain rated at a 3 after receiving 100 of [place] and her left hip appears shorted and internally rotated.

## 2017-10-23 ENCOUNTER — Encounter: Payer: Self-pay | Admitting: *Deleted

## 2017-10-23 ENCOUNTER — Other Ambulatory Visit: Payer: Self-pay

## 2017-10-23 DIAGNOSIS — J449 Chronic obstructive pulmonary disease, unspecified: Secondary | ICD-10-CM | POA: Diagnosis not present

## 2017-10-23 DIAGNOSIS — H409 Unspecified glaucoma: Secondary | ICD-10-CM | POA: Diagnosis not present

## 2017-10-23 DIAGNOSIS — I10 Essential (primary) hypertension: Secondary | ICD-10-CM | POA: Diagnosis not present

## 2017-10-23 DIAGNOSIS — R55 Syncope and collapse: Secondary | ICD-10-CM | POA: Diagnosis not present

## 2017-10-23 LAB — TSH: TSH: 3.109 u[IU]/mL (ref 0.350–4.500)

## 2017-10-23 MED ORDER — HYDROCODONE-ACETAMINOPHEN 5-325 MG PO TABS
1.0000 | ORAL_TABLET | ORAL | Status: DC | PRN
  Administered 2017-10-23: 1 via ORAL
  Filled 2017-10-23: qty 1

## 2017-10-23 MED ORDER — ENOXAPARIN SODIUM 40 MG/0.4ML ~~LOC~~ SOLN
40.0000 mg | SUBCUTANEOUS | Status: DC
  Administered 2017-10-23 – 2017-10-25 (×2): 40 mg via SUBCUTANEOUS
  Filled 2017-10-23 (×3): qty 0.4

## 2017-10-23 MED ORDER — ALBUTEROL SULFATE (2.5 MG/3ML) 0.083% IN NEBU: 2.5000 mg | INHALATION_SOLUTION | Freq: Four times a day (QID) | RESPIRATORY_TRACT | Status: DC | PRN

## 2017-10-23 MED ORDER — ACETAMINOPHEN 325 MG PO TABS
650.0000 mg | ORAL_TABLET | Freq: Four times a day (QID) | ORAL | Status: DC | PRN
  Administered 2017-10-23: 650 mg via ORAL
  Filled 2017-10-23: qty 2

## 2017-10-23 MED ORDER — ACETAMINOPHEN 650 MG RE SUPP: 650.0000 mg | Freq: Four times a day (QID) | RECTAL | Status: DC | PRN

## 2017-10-23 MED ORDER — ARFORMOTEROL TARTRATE 15 MCG/2ML IN NEBU
15.0000 ug | INHALATION_SOLUTION | Freq: Two times a day (BID) | RESPIRATORY_TRACT | Status: DC
  Administered 2017-10-23: 15 ug via RESPIRATORY_TRACT
  Filled 2017-10-23 (×4): qty 2

## 2017-10-23 MED ORDER — ONDANSETRON HCL 4 MG/2ML IJ SOLN: 4.0000 mg | Freq: Four times a day (QID) | INTRAMUSCULAR | Status: DC | PRN

## 2017-10-23 MED ORDER — LOSARTAN POTASSIUM 50 MG PO TABS
50.0000 mg | ORAL_TABLET | Freq: Every day | ORAL | Status: DC
  Administered 2017-10-24: 50 mg via ORAL
  Filled 2017-10-23 (×2): qty 1

## 2017-10-23 MED ORDER — DOCUSATE SODIUM 100 MG PO CAPS
100.0000 mg | ORAL_CAPSULE | Freq: Two times a day (BID) | ORAL | Status: DC
  Filled 2017-10-23 (×4): qty 1

## 2017-10-23 MED ORDER — AZITHROMYCIN 250 MG PO TABS
250.0000 mg | ORAL_TABLET | Freq: Every day | ORAL | Status: DC
  Filled 2017-10-23: qty 1

## 2017-10-23 MED ORDER — CALCIUM CARBONATE-VITAMIN D 500-200 MG-UNIT PO TABS
1.0000 | ORAL_TABLET | Freq: Two times a day (BID) | ORAL | Status: DC
  Filled 2017-10-23 (×4): qty 1

## 2017-10-23 MED ORDER — ASPIRIN EC 81 MG PO TBEC
81.0000 mg | DELAYED_RELEASE_TABLET | Freq: Every day | ORAL | Status: DC
  Administered 2017-10-23: 81 mg via ORAL
  Filled 2017-10-23 (×2): qty 1

## 2017-10-23 MED ORDER — ONDANSETRON HCL 4 MG PO TABS: 4.0000 mg | ORAL_TABLET | Freq: Four times a day (QID) | ORAL | Status: DC | PRN

## 2017-10-23 MED ORDER — MECLIZINE HCL 12.5 MG PO TABS
12.5000 mg | ORAL_TABLET | Freq: Two times a day (BID) | ORAL | Status: DC
  Filled 2017-10-23 (×2): qty 1

## 2017-10-23 MED ORDER — SODIUM CHLORIDE 0.9 % IV SOLN
INTRAVENOUS | Status: DC
  Administered 2017-10-23: 05:00:00 via INTRAVENOUS

## 2017-10-23 MED ORDER — OMEGA-3-ACID ETHYL ESTERS 1 G PO CAPS
1.0000 g | ORAL_CAPSULE | Freq: Two times a day (BID) | ORAL | Status: DC
  Administered 2017-10-24: 1 g via ORAL
  Filled 2017-10-23 (×4): qty 1

## 2017-10-23 MED ORDER — HYDROCODONE-ACETAMINOPHEN 5-325 MG PO TABS
1.0000 | ORAL_TABLET | ORAL | Status: DC | PRN
  Administered 2017-10-23 – 2017-10-24 (×5): 1 via ORAL
  Filled 2017-10-23 (×5): qty 1

## 2017-10-23 MED ORDER — TIOTROPIUM BROMIDE MONOHYDRATE 18 MCG IN CAPS
18.0000 ug | ORAL_CAPSULE | Freq: Every day | RESPIRATORY_TRACT | Status: DC
  Filled 2017-10-23: qty 5

## 2017-10-23 MED ORDER — OCUVITE-LUTEIN PO CAPS
1.0000 | ORAL_CAPSULE | Freq: Two times a day (BID) | ORAL | Status: DC
  Filled 2017-10-23 (×6): qty 1

## 2017-10-23 NOTE — ED Provider Notes (Signed)
I assumed care of the patient from Dr. Cherylann Banas at 11:00 PM.  MRI was pending at the time.  I discussed the MRI with radiologist on call who stated that the patient had an injury consistent with a left quadricep tear.  At approximately 1:30 a.m. I was notified by the nursing staff Mallie Mussel that patient became abruptly unresponsive.  I responded to the room and found the patient to be minimally responsive.  Patient had returned to bed from going to the restroom when the event occurred.  Within a matter of 3 minutes the patient regained full consciousness and was alert and oriented x4.  Given multiple syncopal episodes concern for possible cardiac etiology namely cardiac arrhythmia and as such patient discussed with Dr. Marcille Blanco for hospital admission further evaluation and management.  Regarding the patient's left quadricep injury left knee immobilizer applied.   Gregor Hams, MD 10/23/17 [phone]

## 2017-10-23 NOTE — Care Management Obs Status (Signed)
[place] NOTIFICATION   Patient Details  Name: ESME DURKIN MRN: [id number] Date of Birth: 10-21-1933   Medicare Observation Status Notification Given:  Yes Epic down- paper form  Notice signed, one given to patient and the other to HIM for scanning    Katrina Stack, RN 10/23/2017, 4:53 PM

## 2017-10-23 NOTE — Clinical Social Work Note (Signed)
CSW received referral for SNF placement, CSW has began bed search in [facility], patient is agreeable to SNF, formal assessment to follow.  Jones Broom. [place], MSW, [place]  10/23/2017 7:25 PM

## 2017-10-23 NOTE — Progress Notes (Signed)
[facility] NAME: Misty Ferguson    MR#:  [id number]  DATE OF BIRTH:  1933/09/22  SUBJECTIVE:   Patient here after a fall/syncopal episode in complaining of left leg pain. Patient apparently has had recurrent syncope in the past but etiology remains unclear. Patient had no prodromal symptoms prior to her syncopal episode. Patient other than complaining of some left thigh pain denies any other symptoms.  REVIEW OF SYSTEMS:    Review of Systems  Constitutional: Negative for chills and fever.  HENT: Negative for congestion and tinnitus.   Eyes: Negative for blurred vision and double vision.  Respiratory: Negative for cough, shortness of breath and wheezing.   Cardiovascular: Negative for chest pain, orthopnea and PND.  Gastrointestinal: Negative for abdominal pain, diarrhea, nausea and vomiting.  Genitourinary: Negative for dysuria and hematuria.  Musculoskeletal: Positive for falls and joint pain (Left Leg).  Neurological: Negative for dizziness, sensory change and focal weakness.  All other systems reviewed and are negative.   Nutrition: Heart Healthy Tolerating Diet: Yes Tolerating PT: Eval noted.   DRUG ALLERGIES:  No Known Allergies  VITALS:  Blood pressure 126/73, pulse (!) 102, temperature 98.3 F (36.8 C), temperature source Oral, resp. rate 18, height 5\' 1"  (1.549 m), weight 51.2 kg (112 lb 12.8 oz), SpO2 96 %.  PHYSICAL EXAMINATION:   Physical Exam  GENERAL:  84 y.o.-year-old patient lying in bed in no acute distress.  EYES: Pupils equal, round, reactive to light and accommodation. No scleral icterus. Extraocular muscles intact.  HEENT: Head atraumatic, normocephalic. Oropharynx and nasopharynx clear.  NECK:  Supple, no jugular venous distention. No thyroid enlargement, no tenderness.  LUNGS: Normal breath sounds bilaterally, no wheezing, rales, rhonchi. No use of accessory muscles of respiration.  CARDIOVASCULAR:  S1, S2 normal. No murmurs, rubs, or gallops.  ABDOMEN: Soft, nontender, nondistended. Bowel sounds present. No organomegaly or mass.  EXTREMITIES: No cyanosis, clubbing, left leg edema 1+ greater than right.    NEUROLOGIC: Cranial nerves II through XII are intact. No focal Motor or sensory deficits b/l.   PSYCHIATRIC: The patient is alert and oriented x 3.  SKIN: No obvious rash, lesion, or ulcer.    LABORATORY PANEL:   CBC Recent Labs  Lab 10/22/17 2119  WBC 6.2  HGB 12.5  HCT 37.5  PLT 214   ------------------------------------------------------------------------------------------------------------------  Chemistries  Recent Labs  Lab 10/22/17 2119  NA 139  K 4.2  CL 103  CO2 24  GLUCOSE 109*  BUN 18  CREATININE 0.90  CALCIUM 9.4  AST 29  ALT 21  ALKPHOS 48  BILITOT 0.8   ------------------------------------------------------------------------------------------------------------------  Cardiac Enzymes Recent Labs  Lab 10/22/17 2119  TROPONINI <0.03   ------------------------------------------------------------------------------------------------------------------  RADIOLOGY:  Ct Head Wo Contrast  Result Date: 10/22/2017 CLINICAL DATA:  Lost balance in kitchen and fell, struck a tooth and hurt her hip. EXAM: CT HEAD WITHOUT CONTRAST CT MAXILLOFACIAL WITHOUT CONTRAST TECHNIQUE: Multidetector CT imaging of the head and maxillofacial structures were performed using the standard protocol without intravenous contrast. Multiplanar CT image reconstructions of the maxillofacial structures were also generated. COMPARISON:  None. FINDINGS: CT HEAD FINDINGS BRAIN: No intraparenchymal hemorrhage, mass effect nor midline shift. The ventricles and sulci are normal for age. Minimal supratentorial white matter hypodensities less than expected for patient's age, though non-specific are most compatible with chronic small vessel ischemic disease. No acute large vascular territory  infarcts. No abnormal extra-axial fluid collections. Basal cisterns are patent. VASCULAR:  Trace calcific atherosclerosis of the carotid siphons. SKULL: No skull fracture. Osteopenia. No significant scalp soft tissue swelling. OTHER: None. CT MAXILLOFACIAL FINDINGS OSSEOUS: The mandible is intact, the condyles are located. No acute facial fracture. Acute avulsion tooth 8 without alveolar ridge fracture. No destructive bony lesions. Grade 1 C3-4 anterolisthesis, moderate to severe C4-5 and moderate C3-4 degenerative discs. Severe LEFT cervical facet arthropathy. Moderate to severe C3-4 neural foraminal narrowing. ORBITS: Status post bilateral ocular lens implants.  Nonacute. SINUSES: Chronic LEFT sphenoid sinusitis with atresia and bony remodeling. Trace maxillary sinus mucosal thickening. Nasal septum is midline. Bilateral mastoid effusions with RIGHT air cell coalescence. SOFT TISSUES: Mild LEFT premalar and perioral soft tissue swelling without subcutaneous gas or radiopaque foreign bodies. Mild calcific atherosclerosis of the included carotid siphons. IMPRESSION: CT HEAD: 1. Normal noncontrast CT HEAD for age. CT MAXILLOFACIAL: 1. Acute avulsion tooth 8 without facial fracture. 2. Bilateral mastoid effusions and RIGHT coalescent chronic mastoiditis. Electronically Signed   By: Elon Alas M.D.   On: 10/22/2017 22:03   Mr Hip Left Wo Contrast  Result Date: 10/23/2017 CLINICAL DATA:  Left hip pain after fall.  Negative x-rays. EXAM: MR OF THE LEFT HIP WITHOUT CONTRAST TECHNIQUE: Multiplanar, multisequence MR imaging was performed. No intravenous contrast was administered. COMPARISON:  Left hip x-rays from same day. FINDINGS: Bones: There is no evidence of acute fracture, dislocation or avascular necrosis. The visualized bony pelvis appears normal. The visualized sacroiliac joints and symphysis pubis appear normal. Articular cartilage and labrum Articular cartilage: No focal chondral defect or subchondral  signal abnormality identified. Labrum: There is no gross labral tear or paralabral abnormality. Joint or bursal effusion Joint effusion: No significant hip joint effusion. Bursae: No focal periarticular fluid collection. Muscles and tendons Muscles and tendons: There is prominent edema within the visualized proximal vastus lateralis, vastus intermedius, and vastus medialis muscles, which are slightly atrophic when compared to the right. There is low-grade partial tearing of the right hamstring tendon origin. The visualized gluteus, iliopsoas, and left hamstring tendons appear normal. The piriformis muscles appear symmetric. Other findings Miscellaneous: The visualized internal pelvic contents appear unremarkable. Small amount of subcutaneous edema over the lateral upper thigh. IMPRESSION: 1.  No acute osseous abnormality. 2. Prominent edema within the visualized proximal left vastus lateralis, vastus intermedius, and vastus medialis muscles. While this may reflect contusion from direct trauma or severe muscle strain, given that these muscles are slightly atrophic when compared to the right, denervation should also be considered. Electronically Signed   By: Titus Dubin M.D.   On: 10/23/2017 08:13   Dg Hip Unilat With Pelvis 2-3 Views Left  Result Date: 10/22/2017 CLINICAL DATA:  Status post fall, with left hip pain. Initial encounter. EXAM: DG HIP (WITH OR WITHOUT PELVIS) 2-3V LEFT COMPARISON:  None. FINDINGS: There is no evidence of fracture or dislocation. Both femoral heads are seated normally within their respective acetabula. The proximal left femur appears intact. No significant degenerative change is appreciated. The sacroiliac joints are unremarkable in appearance. The visualized bowel gas pattern is grossly unremarkable in appearance. IMPRESSION: No evidence of fracture or dislocation. Electronically Signed   By: Garald Balding M.D.   On: 10/22/2017 22:02   Dg Femur Min 2 Views Left  Result Date:  10/22/2017 CLINICAL DATA:  Left leg pain after fall EXAM: LEFT FEMUR 2 VIEWS COMPARISON:  None. FINDINGS: There is no evidence of fracture or other focal bone lesions. Soft tissues are unremarkable. IMPRESSION: Negative. Electronically Signed   By:  Ashley Royalty M.D.   On: 10/22/2017 23:26   Ct Maxillofacial Wo Contrast  Result Date: 10/22/2017 CLINICAL DATA:  Lost balance in kitchen and fell, struck a tooth and hurt her hip. EXAM: CT HEAD WITHOUT CONTRAST CT MAXILLOFACIAL WITHOUT CONTRAST TECHNIQUE: Multidetector CT imaging of the head and maxillofacial structures were performed using the standard protocol without intravenous contrast. Multiplanar CT image reconstructions of the maxillofacial structures were also generated. COMPARISON:  None. FINDINGS: CT HEAD FINDINGS BRAIN: No intraparenchymal hemorrhage, mass effect nor midline shift. The ventricles and sulci are normal for age. Minimal supratentorial white matter hypodensities less than expected for patient's age, though non-specific are most compatible with chronic small vessel ischemic disease. No acute large vascular territory infarcts. No abnormal extra-axial fluid collections. Basal cisterns are patent. VASCULAR: Trace calcific atherosclerosis of the carotid siphons. SKULL: No skull fracture. Osteopenia. No significant scalp soft tissue swelling. OTHER: None. CT MAXILLOFACIAL FINDINGS OSSEOUS: The mandible is intact, the condyles are located. No acute facial fracture. Acute avulsion tooth 8 without alveolar ridge fracture. No destructive bony lesions. Grade 1 C3-4 anterolisthesis, moderate to severe C4-5 and moderate C3-4 degenerative discs. Severe LEFT cervical facet arthropathy. Moderate to severe C3-4 neural foraminal narrowing. ORBITS: Status post bilateral ocular lens implants.  Nonacute. SINUSES: Chronic LEFT sphenoid sinusitis with atresia and bony remodeling. Trace maxillary sinus mucosal thickening. Nasal septum is midline. Bilateral mastoid  effusions with RIGHT air cell coalescence. SOFT TISSUES: Mild LEFT premalar and perioral soft tissue swelling without subcutaneous gas or radiopaque foreign bodies. Mild calcific atherosclerosis of the included carotid siphons. IMPRESSION: CT HEAD: 1. Normal noncontrast CT HEAD for age. CT MAXILLOFACIAL: 1. Acute avulsion tooth 8 without facial fracture. 2. Bilateral mastoid effusions and RIGHT coalescent chronic mastoiditis. Electronically Signed   By: Elon Alas M.D.   On: 10/22/2017 22:03     ASSESSMENT AND PLAN:   84 year old female with past medical history of hypertension, diabetes, macular degeneration, osteoporosis, COPD who presents to the hospital after a syncopal episode and complaining of left leg pain.  1. Syncope-patient has had recurrent syncope since yesterday. She denies any prodromal symptoms prior to her episode. She has been worked up in the past with a 48-hour Holter monitor which was negative. -Her neurologic workup Has been negative. Seen by cardiology and the plan on doing a 30 day event monitor. - no alarms on tele overnight and had recent Echo which was normal.   2. Left leg pain-secondary to left quadriceps muscle tear/contusion from her recent fall. -Supportive care with pain control with oral Percocet, cont. PT  3. History of COPD-no acute exacerbation. Continue Spiriva,albuterol nebs, Brovana  4. HTN - cont. Losartan  5. Hx of Vertigo - cont. Meclizine  All the records are reviewed and case discussed with Care Management/Social Worker. Management plans discussed with the patient, family and they are in agreement.  CODE STATUS: DNR  DVT Prophylaxis: Lovenox  TOTAL TIME TAKING CARE OF THIS PATIENT: 30 minutes.   POSSIBLE D/C IN 1-2 DAYS, DEPENDING ON CLINICAL CONDITION.   Henreitta Leber M.D on 10/23/2017 at 2:19 PM  Between 7am to 6pm - Pager - [phone]  After 6pm go to [email or web address] - Proofreader  [facility]ospital[facility]  Office  [phone]  CC: Primary care physician; [name], MD

## 2017-10-23 NOTE — ED Notes (Signed)
Alyzza Andringa RN helped the Pt to the bedside commode. Pt had a syncopal episode as she was complaining of severe pain while sitting back on the bed. Pt regained consciousness about 10 seconds later.

## 2017-10-23 NOTE — NC FL2 (Signed)
[place] LEVEL OF CARE SCREENING TOOL     IDENTIFICATION  Patient Name: Misty Ferguson Birthdate: 12/31/1933 Sex: female Admission Date (Current Location): 10/22/2017  [place] and [place] Number:  Engineering geologist and Address:  [facility] [address] [place], [place] [address]      Provider Number: [id number]  Attending Physician Name and Address:  Henreitta Leber, MD  Relative Name and Phone Number:  Schnake,Davonta Stroot Spouse [phone]     Current Level of Care: Hospital Recommended Level of Care: [place] Prior Approval Number:    Date Approved/Denied:   PASRR Number: [id number]  Discharge Plan: SNF    Current Diagnoses: Patient Active Problem List   Diagnosis Date Noted  . Syncope 10/23/2017  . Rash 03/22/2017  . Senile purpura ([place]) 10/04/2016  . Peripheral neuropathy 07/24/2016  . Counseling regarding end of life decision making 09/21/2014  . Allergic rhinitis 12/23/2012  . Chronic hypoxemic respiratory failure ([place]) 08/25/2012  . MICROALBUMINURIA 04/26/2010  . HYPERCHOLESTEROLEMIA 03/24/2008  . Prediabetes 12/11/2006  . MACULAR DEGENERATION 12/10/2006  . GLAUCOMA 12/10/2006  . Essential hypertension, benign 12/10/2006  . COPD (chronic obstructive pulmonary disease) with emphysema ([place]) 12/10/2006  . Osteoporosis 12/10/2006    Orientation RESPIRATION BLADDER Height & Weight     Time, Place, Self, Situation  O2(3L) Continent Weight: 112 lb 12.8 oz (51.2 kg) Height:  5\' 1"  (154.9 cm)  BEHAVIORAL SYMPTOMS/MOOD NEUROLOGICAL BOWEL NUTRITION STATUS      Continent Diet(Cardiac diet)  AMBULATORY STATUS COMMUNICATION OF NEEDS Skin   Limited Assist Verbally Normal                       Personal Care Assistance Level of Assistance  Bathing, Feeding, Dressing Bathing Assistance: Limited assistance Feeding assistance: Independent Dressing Assistance: Limited assistance      Functional Limitations Info  Sight, Hearing, Speech Sight Info: Adequate Hearing Info: Adequate Speech Info: Adequate    SPECIAL CARE FACTORS FREQUENCY  PT (By licensed PT), OT (By licensed OT)     PT Frequency: 5x a week OT Frequency: 5x a week            Contractures Contractures Info: Not present    Additional Factors Info  Code Status, Allergies Code Status Info: DNR Allergies Info: NKA           Current Medications (10/23/2017):  This is the current hospital active medication list Current Facility-Administered Medications  Medication Dose Route Frequency Provider Last Rate Last Dose  . acetaminophen (TYLENOL) tablet 650 mg  650 mg Oral Q6H PRN Harrie Foreman, MD   650 mg at 10/23/17 1820   Or  . acetaminophen (TYLENOL) suppository 650 mg  650 mg Rectal Q6H PRN Harrie Foreman, MD      . albuterol (PROVENTIL) (2.5 MG/3ML) 0.083% nebulizer solution 2.5 mg  2.5 mg Nebulization Q6H PRN Harrie Foreman, MD      . arformoterol [facility]) nebulizer solution 15 mcg  15 mcg Nebulization BID Harrie Foreman, MD      . aspirin EC tablet 81 mg  81 mg Oral QHS Harrie Foreman, MD      . calcium-vitamin D (OSCAL WITH D) 500-200 MG-UNIT per tablet 1 tablet  1 tablet Oral BID Harrie Foreman, MD      . docusate sodium (COLACE) capsule 100 mg  100 mg Oral BID Harrie Foreman, MD      .  enoxaparin (LOVENOX) injection 40 mg  40 mg Subcutaneous Q24H Harrie Foreman, MD   40 mg at 10/23/17 0502  . HYDROcodone-acetaminophen (NORCO/VICODIN) 5-325 MG per tablet 1 tablet  1 tablet Oral Q4H PRN Henreitta Leber, MD   1 tablet at 10/23/17 1741  . losartan (COZAAR) tablet 50 mg  50 mg Oral Daily Harrie Foreman, MD      . multivitamin-lutein (OCUVITE-LUTEIN) capsule 1 capsule  1 capsule Oral BID Harrie Foreman, MD      . omega-3 acid ethyl esters (LOVAZA) capsule 1 g  1 g Oral BID Harrie Foreman, MD      . ondansetron [facility]) tablet 4 mg  4 mg Oral Q6H PRN  Harrie Foreman, MD       Or  . ondansetron [facility]) injection 4 mg  4 mg Intravenous Q6H PRN Harrie Foreman, MD      . tiotropium [facility]) inhalation capsule 18 mcg  18 mcg Inhalation Daily Harrie Foreman, MD         Discharge Medications: Please see discharge summary for a list of discharge medications.  Relevant Imaging Results:  Relevant Lab Results:   Additional Information SSN [id number]  Ross Ludwig, [place]

## 2017-10-23 NOTE — Consult Note (Signed)
[facility] Cardiology  CARDIOLOGY CONSULT NOTE  Patient ID: TATTIANA FAKHOURI MRN: [id number] DOB/AGE: 1934-07-17 84 y.o.  Admit date: 10/22/2017 Referring Physician [facility] Primary Physician  Primary Cardiologist Fath Reason for Consultation Syncope  HPI: 84 year old female referred for evaluation of syncope.  The patient was you state of health until last evening, while in the kitchen, he experienced a syncopal episode, fell, with resultant left quadricep tear.  In the emergency room, patient had a second syncopal episode but was not on a telemetry monitor at that time.  Admission labs notable for negative troponin less than 0.03.  EKG shows normal sinus rhythm with right bundle branch block.  Telemetry reveals sinus tachycardia rate 100 bpm.  Patient underwent recent cardiovascular evaluation for shortness of breath.  48-hour Holter monitor 10/17/2017 revealed normal sinus rhythm at a rate of 89 bpm with premature atrial contractions and premature ventricular contractions.  2D echocardiogram 10/16/2017 revealed normal left ventricular function, with LVEF greater than 55% with mild mitral and tricuspid regurgitation.  Review of systems complete and found to be negative unless listed above     Past Medical History:  Diagnosis Date  . Chronic airway obstruction, not elsewhere classified   . Macular degeneration (senile) of retina, unspecified   . Obstructive chronic bronchitis with exacerbation ([place])   . Osteoporosis, unspecified   . Personal history of peptic ulcer disease   . Tobacco use disorder   . Type II or unspecified type diabetes mellitus without mention of complication, not stated as uncontrolled   . Unspecified disorder resulting from impaired renal function   . Unspecified essential hypertension   . Unspecified glaucoma(365.9)     History reviewed. No pertinent surgical history.  Medications Prior to Admission  Medication Sig Dispense Refill Last Dose  . albuterol (ACCUNEB) 1.25 MG/3ML  nebulizer solution Take 3 mLs (1.25 mg total) by nebulization every 6 (six) hours as needed. 75 mL 5 prn at prn  . albuterol (PROVENTIL HFA;VENTOLIN HFA) 108 (90 Base) MCG/ACT inhaler Inhale 2 puffs into the lungs every 6 (six) hours as needed for wheezing or shortness of breath. 1 Inhaler 6 prn at prn  . Ascorbic Acid (VITAMIN C PO) Take 1 tablet by mouth daily.   10/22/2017 at Unknown time  . aspirin 81 MG tablet Take 81 mg by mouth at bedtime.    Past Week at Unknown time  . azithromycin (ZITHROMAX) 250 MG tablet TAKE 1 TABLET EVERY DAY 90 tablet 1 10/22/2017 at Unknown time  . Calcium Carbonate-Vitamin D (CALCIUM 600/VITAMIN D) 600-400 MG-UNIT per tablet Take 1 tablet by mouth 2 (two) times daily.     10/22/2017 at Unknown time  . losartan (COZAAR) 50 MG tablet TAKE 1 TABLET EVERY DAY 90 tablet 0 10/22/2017 at Unknown time  . Multiple Vitamins-Minerals (PRESERVISION AREDS) TABS Take 1 tablet by mouth 2 (two) times daily.   10/22/2017 at Unknown time  . Omega-3 Fatty Acids (FISH OIL) 1000 MG CAPS Take 1 capsule by mouth 2 (two) times daily.    10/22/2017 at Unknown time  . STIOLTO RESPIMAT 2.5-2.5 MCG/ACT AERS INHALE 2 PUFFS INTO THE LUNGS EVERY DAY 12 g 1 10/22/2017 at Unknown time   Social History   Socioeconomic History  . Marital status: Married    Spouse name: Not on file  . Number of children: 3  . Years of education: Not on file  . Highest education level: Not on file  Social Needs  . Financial resource strain: Not on file  .  Food insecurity - worry: Not on file  . Food insecurity - inability: Not on file  . Transportation needs - medical: Not on file  . Transportation needs - non-medical: Not on file  Occupational History  . Occupation: RETIRED    Employer: RETIRED  Tobacco Use  . Smoking status: Former Smoker    Packs/day: 1.30    Years: 45.00    Pack years: 58.50    Types: Cigarettes    Last attempt to quit: 08/14/2003    Years since quitting: 14.2  . Smokeless tobacco: Never  Used  Substance and Sexual Activity  . Alcohol use: Yes    Comment: Occasional  . Drug use: No  . Sexual activity: Yes  Other Topics Concern  . Not on file  Social History Narrative   Tobacco abuse x 45 years    History reviewed. No pertinent family history.    Review of systems complete and found to be negative unless listed above      PHYSICAL EXAM  General: Well developed, well nourished, in no acute distress HEENT:  Normocephalic and atramatic Neck:  No JVD.  Lungs: Clear bilaterally to auscultation and percussion. Heart: HRRR . Normal S1 and S2 without gallops or murmurs.  Abdomen: Bowel sounds are positive, abdomen soft and non-tender  Msk:  Back normal, normal gait. Normal strength and tone for age. Extremities: No clubbing, cyanosis or edema.   Neuro: Alert and oriented X 3. Psych:  Good affect, responds appropriately  Labs:   Lab Results  Component Value Date   WBC 6.2 10/22/2017   HGB 12.5 10/22/2017   HCT 37.5 10/22/2017   MCV 95.1 10/22/2017   PLT 214 10/22/2017    Recent Labs  Lab 10/22/17 2119  NA 139  K 4.2  CL 103  CO2 24  BUN 18  CREATININE 0.90  CALCIUM 9.4  PROT 6.8  BILITOT 0.8  ALKPHOS 48  ALT 21  AST 29  GLUCOSE 109*   Lab Results  Component Value Date   CKTOTAL 32 08/19/2010   CKMB 2.3 08/19/2010   TROPONINI <0.03 10/22/2017    Lab Results  Component Value Date   CHOL 298 (H) 10/04/2017   CHOL 282 (H) 09/27/2016   CHOL 242 (H) 09/20/2015   Lab Results  Component Value Date   HDL 165.20 10/04/2017   HDL 165.40 09/27/2016   HDL 133.00 09/20/2015   Lab Results  Component Value Date   LDLCALC 118 (H) 10/04/2017   LDLCALC 107 (H) 09/27/2016   LDLCALC 98 09/20/2015   Lab Results  Component Value Date   TRIG 75.0 10/04/2017   TRIG 48.0 09/27/2016   TRIG 56.0 09/20/2015   Lab Results  Component Value Date   CHOLHDL 2 10/04/2017   CHOLHDL 2 09/27/2016   CHOLHDL 2 09/20/2015   Lab Results  Component Value  Date   LDLDIRECT 99.1 09/08/2013   LDLDIRECT 75.5 06/05/2012   LDLDIRECT 75.0 06/14/2011      Radiology: Ct Head Wo Contrast  Result Date: 10/22/2017 CLINICAL DATA:  Lost balance in kitchen and fell, struck a tooth and hurt her hip. EXAM: CT HEAD WITHOUT CONTRAST CT MAXILLOFACIAL WITHOUT CONTRAST TECHNIQUE: Multidetector CT imaging of the head and maxillofacial structures were performed using the standard protocol without intravenous contrast. Multiplanar CT image reconstructions of the maxillofacial structures were also generated. COMPARISON:  None. FINDINGS: CT HEAD FINDINGS BRAIN: No intraparenchymal hemorrhage, mass effect nor midline shift. The ventricles and sulci are normal for age. Minimal  supratentorial white matter hypodensities less than expected for patient's age, though non-specific are most compatible with chronic small vessel ischemic disease. No acute large vascular territory infarcts. No abnormal extra-axial fluid collections. Basal cisterns are patent. VASCULAR: Trace calcific atherosclerosis of the carotid siphons. SKULL: No skull fracture. Osteopenia. No significant scalp soft tissue swelling. OTHER: None. CT MAXILLOFACIAL FINDINGS OSSEOUS: The mandible is intact, the condyles are located. No acute facial fracture. Acute avulsion tooth 8 without alveolar ridge fracture. No destructive bony lesions. Grade 1 C3-4 anterolisthesis, moderate to severe C4-5 and moderate C3-4 degenerative discs. Severe LEFT cervical facet arthropathy. Moderate to severe C3-4 neural foraminal narrowing. ORBITS: Status post bilateral ocular lens implants.  Nonacute. SINUSES: Chronic LEFT sphenoid sinusitis with atresia and bony remodeling. Trace maxillary sinus mucosal thickening. Nasal septum is midline. Bilateral mastoid effusions with RIGHT air cell coalescence. SOFT TISSUES: Mild LEFT premalar and perioral soft tissue swelling without subcutaneous gas or radiopaque foreign bodies. Mild calcific atherosclerosis  of the included carotid siphons. IMPRESSION: CT HEAD: 1. Normal noncontrast CT HEAD for age. CT MAXILLOFACIAL: 1. Acute avulsion tooth 8 without facial fracture. 2. Bilateral mastoid effusions and RIGHT coalescent chronic mastoiditis. Electronically Signed   By: Elon Alas M.D.   On: 10/22/2017 22:03   Mr Hip Left Wo Contrast  Result Date: 10/23/2017 CLINICAL DATA:  Left hip pain after fall.  Negative x-rays. EXAM: MR OF THE LEFT HIP WITHOUT CONTRAST TECHNIQUE: Multiplanar, multisequence MR imaging was performed. No intravenous contrast was administered. COMPARISON:  Left hip x-rays from same day. FINDINGS: Bones: There is no evidence of acute fracture, dislocation or avascular necrosis. The visualized bony pelvis appears normal. The visualized sacroiliac joints and symphysis pubis appear normal. Articular cartilage and labrum Articular cartilage: No focal chondral defect or subchondral signal abnormality identified. Labrum: There is no gross labral tear or paralabral abnormality. Joint or bursal effusion Joint effusion: No significant hip joint effusion. Bursae: No focal periarticular fluid collection. Muscles and tendons Muscles and tendons: There is prominent edema within the visualized proximal vastus lateralis, vastus intermedius, and vastus medialis muscles, which are slightly atrophic when compared to the right. There is low-grade partial tearing of the right hamstring tendon origin. The visualized gluteus, iliopsoas, and left hamstring tendons appear normal. The piriformis muscles appear symmetric. Other findings Miscellaneous: The visualized internal pelvic contents appear unremarkable. Small amount of subcutaneous edema over the lateral upper thigh. IMPRESSION: 1.  No acute osseous abnormality. 2. Prominent edema within the visualized proximal left vastus lateralis, vastus intermedius, and vastus medialis muscles. While this may reflect contusion from direct trauma or severe muscle strain, given  that these muscles are slightly atrophic when compared to the right, denervation should also be considered. Electronically Signed   By: Titus Dubin M.D.   On: 10/23/2017 08:13   Dg Hip Unilat With Pelvis 2-3 Views Left  Result Date: 10/22/2017 CLINICAL DATA:  Status post fall, with left hip pain. Initial encounter. EXAM: DG HIP (WITH OR WITHOUT PELVIS) 2-3V LEFT COMPARISON:  None. FINDINGS: There is no evidence of fracture or dislocation. Both femoral heads are seated normally within their respective acetabula. The proximal left femur appears intact. No significant degenerative change is appreciated. The sacroiliac joints are unremarkable in appearance. The visualized bowel gas pattern is grossly unremarkable in appearance. IMPRESSION: No evidence of fracture or dislocation. Electronically Signed   By: Garald Balding M.D.   On: 10/22/2017 22:02   Dg Femur Min 2 Views Left  Result Date: 10/22/2017 CLINICAL DATA:  Left leg pain after fall EXAM: LEFT FEMUR 2 VIEWS COMPARISON:  None. FINDINGS: There is no evidence of fracture or other focal bone lesions. Soft tissues are unremarkable. IMPRESSION: Negative. Electronically Signed   By: Ashley Royalty M.D.   On: 10/22/2017 23:26   Ct Maxillofacial Wo Contrast  Result Date: 10/22/2017 CLINICAL DATA:  Lost balance in kitchen and fell, struck a tooth and hurt her hip. EXAM: CT HEAD WITHOUT CONTRAST CT MAXILLOFACIAL WITHOUT CONTRAST TECHNIQUE: Multidetector CT imaging of the head and maxillofacial structures were performed using the standard protocol without intravenous contrast. Multiplanar CT image reconstructions of the maxillofacial structures were also generated. COMPARISON:  None. FINDINGS: CT HEAD FINDINGS BRAIN: No intraparenchymal hemorrhage, mass effect nor midline shift. The ventricles and sulci are normal for age. Minimal supratentorial white matter hypodensities less than expected for patient's age, though non-specific are most compatible with chronic  small vessel ischemic disease. No acute large vascular territory infarcts. No abnormal extra-axial fluid collections. Basal cisterns are patent. VASCULAR: Trace calcific atherosclerosis of the carotid siphons. SKULL: No skull fracture. Osteopenia. No significant scalp soft tissue swelling. OTHER: None. CT MAXILLOFACIAL FINDINGS OSSEOUS: The mandible is intact, the condyles are located. No acute facial fracture. Acute avulsion tooth 8 without alveolar ridge fracture. No destructive bony lesions. Grade 1 C3-4 anterolisthesis, moderate to severe C4-5 and moderate C3-4 degenerative discs. Severe LEFT cervical facet arthropathy. Moderate to severe C3-4 neural foraminal narrowing. ORBITS: Status post bilateral ocular lens implants.  Nonacute. SINUSES: Chronic LEFT sphenoid sinusitis with atresia and bony remodeling. Trace maxillary sinus mucosal thickening. Nasal septum is midline. Bilateral mastoid effusions with RIGHT air cell coalescence. SOFT TISSUES: Mild LEFT premalar and perioral soft tissue swelling without subcutaneous gas or radiopaque foreign bodies. Mild calcific atherosclerosis of the included carotid siphons. IMPRESSION: CT HEAD: 1. Normal noncontrast CT HEAD for age. CT MAXILLOFACIAL: 1. Acute avulsion tooth 8 without facial fracture. 2. Bilateral mastoid effusions and RIGHT coalescent chronic mastoiditis. Electronically Signed   By: Elon Alas M.D.   On: 10/22/2017 22:03    EKG: Sinus rhythm with right bundle branch block  ASSESSMENT AND PLAN:   1.  Syncope of uncertain etiology.  Recent 48-hour Holter monitor was unremarkable.  Recent 2D echocardiogram revealed normal left ventricular function.  Patient has remained in sinus rhythm with underlying right bundle branch block.  Recommendations  1.  Agree with current therapy 2.  Continue to monitor on telemetry 3.  30 Day Loop monitor on discharge 4.  Follow-up with Dr. Ubaldo Glassing as outpatient   Signed: Isaias Cowman MD,PhD,  [facility] 10/23/2017, 8:49 AM

## 2017-10-23 NOTE — Care Management (Addendum)
Placed in observation from home.  Patient says without warning she just fell to the floor.Has chronic oxygen. Thus far work up is negative.  Cardiology is recommending 30 day loop monitor at discharge. Lives with her husband. MRI of left hip is negative for fx but interfering with patient's ability to ambulate. Patient lost one of her teeth from the impact of the fall and sustained a quadriceps tear. Physical therapy at present is recommending skilled nursing facility placement.  Patient who prior to this relays she was independent in her adls and ambulation was only able to ambulate 2 feet with therapist. She is taking oral pain meds

## 2017-10-23 NOTE — Evaluation (Signed)
Occupational Therapy Evaluation Patient Details Name: Misty Ferguson MRN: [id number] DOB: [birth year] Today's Date: 10/23/2017    History of Present Illness The patient is an 84 yo F with past medical history of COPD and glaucoma presents the emergency department after a fall. The patient stated that she fell and subsequently injured her left leg and hip but does not recall the fall. MRI of her lower extremity revealed edema and inflammation of the quadriceps consistent with tear. While in the emergency department the patient had a witnessed episode of syncope that occurred after a trip to the commode as she she sat on the bed. She did not fall nor did she hit her head. This is the second time the patient has had an episode of fainting. She admits to not having any recollection of loss of consciousness. She also denies dizziness or any associated prodrome to losing consciousness. Outpatient workup after her first episode of syncope was unremarkable.  Assessment includes:  Syncope, COPD, HTN, Glaucoma, mastoid effusions, L quadriceps tear.   Clinical Impression   Pt seen for OT evaluation this date. Prior to hospital admission, pt was independent in all aspects of mobility and ADL, independent with home O2 (3L), no longer drives due to vision issues, and staying physically active and socially engaged.  Pt lives with her spouse in a 1 story home with walk in shower. Pt reports no other falls aside from recent falls related to unexplained syncope. Currently pt is primarily limited by significant L quad pain with any movement but even at rest. OT noted that external urinary catheter had become dislodged/misplaced and bedding and hospital gown were wet with urine. With assist from the nurse tech, pt participated in rolling side to side with mod assist to support the LLE while OT and nurse tech assisted with changing the bed linens, cleansing the skin to prevent skin breakdown, and helped pt don a clean  gown. Pt noted significant increase in L quad pain with any movement. Pt instructed in pursed lip breathing during mobility efforts to support pain mgt and minimize SOB. Pt able to return demo. Pt would benefit from skilled OT to address noted impairments and functional limitations (see below for any additional details).  Upon hospital discharge, recommend pt discharge to [place].    Follow Up Recommendations  SNF    Equipment Recommendations  3 in 1 bedside commode    Recommendations for Other Services       Precautions / Restrictions Precautions Precautions: Fall Restrictions Weight Bearing Restrictions: No Other Position/Activity Restrictions: No WB restrictions seen in chart      Mobility Bed Mobility Overal bed mobility: Needs Assistance Bed Mobility: Rolling Rolling: Mod assist         General bed mobility comments: mod assist to support LLE during rolling back and forth during hygiene/toileting/changing bed linens  Transfers Overall transfer level: Needs assistance               General transfer comment: pt in too much pain to attempt    Balance                                           ADL either performed or assessed with clinical judgement   ADL Overall ADL's : Needs assistance/impaired Eating/Feeding: Bed level;Independent   Grooming: Bed level;Independent   Upper Body Bathing: Bed level;Minimal assistance   Lower  Body Bathing: Bed level;Maximal assistance   Upper Body Dressing : Bed level;Modified independent   Lower Body Dressing: Bed level;Maximal assistance   Toilet Transfer: Moderate assistance;Stand-pivot;BSC;RW Toilet Transfer Details (indicate cue type and reason): during session, performed rolling in bed due to L quad pain with nurse tech assisting with changing bed linens Toileting- Clothing Manipulation and Hygiene: Maximal assistance;Bed level       Functional mobility during ADLs: Minimal assistance;Rolling  walker(very limited distances) General ADL Comments: pt primarily limited by L quad pain     Vision Baseline Vision/History: Wears glasses;Macular Degeneration;Legally blind;Glaucoma(blind in L eye) Wears Glasses: At all times Patient Visual Report: No change from baseline       Perception     Praxis      Pertinent Vitals/Pain Pain Assessment: 0-10 Pain Score: 8  Pain Location: L quad with no movement, increasing to 10/10 with bed mobility (rolling side to side) Pain Descriptors / Indicators: Aching;Sore;Sharp;Moaning Pain Intervention(s): Limited activity within patient's tolerance;Monitored during session;Premedicated before session;Repositioned     Hand Dominance Right   Extremity/Trunk Assessment Upper Extremity Assessment Upper Extremity Assessment: Overall WFL for tasks assessed(5/5 bilaterally)   Lower Extremity Assessment Lower Extremity Assessment: LLE deficits/detail;Defer to PT evaluation LLE: Unable to fully assess due to pain   Cervical / Trunk Assessment Cervical / Trunk Assessment: Normal   Communication Communication Communication: No difficulties   Cognition Arousal/Alertness: Awake/alert Behavior During Therapy: WFL for tasks assessed/performed Overall Cognitive Status: Within Functional Limits for tasks assessed                                     General Comments       Exercises Other Exercises Other Exercises: pt instructed in log roll technique for bed mobility to minimize L quad pain during toileting tasks with verbal cues for pursed lip breathing after initial instruction to support pain mgt and O2 sats   Shoulder Instructions      Home Living Family/patient expects to be discharged to:: Private residence Living Arrangements: Spouse/significant other Available Help at Discharge: Family;Available 24 hours/day Type of Home: House Home Access: Level entry     Home Layout: One level     Bathroom Shower/Tub: Emergency planning/management officer: Handicapped height     Home Equipment: [place] - single point;Crutches;Shower seat - built in;Hand held shower head          Prior Functioning/Environment Level of Independence: Independent        Comments: Ind amb without AD community distances, Ind with ADLs, no other falls other than recent fall secodnary to syncope, on 3L O2 at home, stays active physically and participates in social activities        OT Problem List: Decreased strength;Decreased knowledge of use of DME or AE;Impaired balance (sitting and/or standing);Pain      OT Treatment/Interventions: Self-care/ADL training;Balance training;Therapeutic exercise;Therapeutic activities;DME and/or AE instruction;Patient/family education    OT Goals(Current goals can be found in the care plan section) Acute Rehab OT Goals Patient Stated Goal: have less pain and return to PLOF OT Goal Formulation: With patient/family Time For Goal Achievement: 11/06/17 Potential to Achieve Goals: Good ADL Goals Pt Will Perform Lower Body Dressing: with min assist;sit to/from stand;with adaptive equipment Pt Will Transfer to Toilet: with min guard assist;ambulating(LRAD for ambulation, comfort height toilet) Additional ADL Goal #1: Pt will verbalize </= 5/10 pain in LLE/quadriceps during mobility and ADL  tasks.  OT Frequency: Min 1X/week   Barriers to D/C:            Co-evaluation              AM-PAC PT "6 Clicks" Daily Activity     Outcome Measure Help from another person eating meals?: None Help from another person taking care of personal grooming?: None Help from another person toileting, which includes using toliet, bedpan, or urinal?: A Lot Help from another person bathing (including washing, rinsing, drying)?: A Lot Help from another person to put on and taking off regular upper body clothing?: A Little Help from another person to put on and taking off regular lower body clothing?: A Lot 6 Click  Score: 17   End of Session    Activity Tolerance: Patient limited by pain Patient left: in bed;with call bell/phone within reach;with bed alarm set;Other (comment)(nurse tech reapplied external urinary catheter)  OT Visit Diagnosis: Other abnormalities of gait and mobility (R26.89);Pain;History of falling (Z91.81) Pain - Right/Left: Left Pain - part of body: Leg                Time: 8588-5027 OT Time Calculation (min): 44 min Charges:  OT General Charges $OT Visit: 1 Visit OT Evaluation $OT Eval Low Complexity: 1 Low OT Treatments $Self Care/Home Management : 23-37 mins  Jeni Salles, MPH, MS, OTR/L ascom [phone] 10/23/17, 3:49 PM

## 2017-10-23 NOTE — Plan of Care (Signed)
  Activity: Risk for activity intolerance will decrease 10/23/2017 1743 - Not Progressing by Daron Offer, RN Note Patient's L.L.E. pain far too severe to meet this goal today. Will continue to medicate for pain control. Wenda Low [facility]

## 2017-10-23 NOTE — H&P (Signed)
Misty Ferguson is an 84 y.o. female.   Chief Complaint: Fall HPI: The patient with past medical history of COPD and glaucoma presents the emergency department after a fall.  The patient states that her legs gave way and that she subsequently injured her left leg and hip.  MRI of her lower extremity revealed edema and inflammation of the quadriceps consistent with tear.  While in the emergency department the patient had a witnessed episode of syncope that occurred after a trip to the commode as she she sat on the bed.  She did not fall nor did she hit her head.  This is the second time the patient has had an episode of fainting.  She admits to not having any recollection of loss of consciousness.  She also denies dizziness or any associated prodrome to losing consciousness.  Outpatient workup after her first episode of syncope was unremarkable.  The emergency department staff called the hospitalist service for further evaluation due to recurrence of symptoms as well as for pain management.  Past Medical History:  Diagnosis Date  . Chronic airway obstruction, not elsewhere classified   . Macular degeneration (senile) of retina, unspecified   . Obstructive chronic bronchitis with exacerbation ([place])   . Osteoporosis, unspecified   . Personal history of peptic ulcer disease   . Tobacco use disorder   . Type II or unspecified type diabetes mellitus without mention of complication, not stated as uncontrolled   . Unspecified disorder resulting from impaired renal function   . Unspecified essential hypertension   . Unspecified glaucoma(365.9)     History reviewed. No pertinent surgical history. None  History reviewed. No pertinent family history. Vision problems  Social History:  reports that she quit smoking about 14 years ago. Her smoking use included cigarettes. She has a 58.50 pack-year smoking history. she has never used smokeless tobacco. She reports that she drinks alcohol. She reports that she  does not use drugs.  Allergies: No Known Allergies  Medications Prior to Admission  Medication Sig Dispense Refill  . albuterol (ACCUNEB) 1.25 MG/3ML nebulizer solution Take 3 mLs (1.25 mg total) by nebulization every 6 (six) hours as needed. 75 mL 5  . albuterol (PROVENTIL HFA;VENTOLIN HFA) 108 (90 Base) MCG/ACT inhaler Inhale 2 puffs into the lungs every 6 (six) hours as needed for wheezing or shortness of breath. 1 Inhaler 6  . Ascorbic Acid (VITAMIN C PO) Take 1 tablet by mouth daily.    Marland Kitchen aspirin 81 MG tablet Take 81 mg by mouth at bedtime.     Marland Kitchen azithromycin (ZITHROMAX) 250 MG tablet TAKE 1 TABLET EVERY DAY 90 tablet 1  . Calcium Carbonate-Vitamin D (CALCIUM 600/VITAMIN D) 600-400 MG-UNIT per tablet Take 1 tablet by mouth 2 (two) times daily.      Marland Kitchen losartan (COZAAR) 50 MG tablet TAKE 1 TABLET EVERY DAY 90 tablet 0  . Multiple Vitamins-Minerals (PRESERVISION AREDS) TABS Take 1 tablet by mouth 2 (two) times daily.    . Omega-3 Fatty Acids (FISH OIL) 1000 MG CAPS Take 1 capsule by mouth 2 (two) times daily.     Marland Kitchen STIOLTO RESPIMAT 2.5-2.5 MCG/ACT AERS INHALE 2 PUFFS INTO THE LUNGS EVERY DAY 12 g 1    Results for orders placed or performed during the hospital encounter of 10/22/17 (from the past 48 hour(s))  Comprehensive metabolic panel     Status: Abnormal   Collection Time: 10/22/17  9:19 PM  Result Value Ref Range   Sodium 139 135 -  145 mmol/L   Potassium 4.2 3.5 - 5.1 mmol/L   Chloride 103 101 - 111 mmol/L   CO2 24 22 - 32 mmol/L   Glucose, Bld 109 (H) 65 - 99 mg/dL   BUN 18 6 - 20 mg/dL   Creatinine, Ser 0.90 0.44 - 1.00 mg/dL   Calcium 9.4 8.9 - 10.3 mg/dL   Total Protein 6.8 6.5 - 8.1 g/dL   Albumin 4.3 3.5 - 5.0 g/dL   AST 29 15 - 41 U/L   ALT 21 14 - 54 U/L   Alkaline Phosphatase 48 38 - 126 U/L   Total Bilirubin 0.8 0.3 - 1.2 mg/dL   GFR calc non Af Amer 58 (L) >60 mL/min   GFR calc Af Amer >60 >60 mL/min    Comment: (NOTE) The eGFR has been calculated using the  CKD EPI equation. This calculation has not been validated in all clinical situations. eGFR's persistently <60 mL/min signify possible Chronic Kidney Disease.    Anion gap 12 5 - 15    Comment: Performed at [facility], [address] [place], [place] [address]  CBC     Status: None   Collection Time: 10/22/17  9:19 PM  Result Value Ref Range   WBC 6.2 3.6 - 11.0 K/uL   RBC 3.95 3.80 - 5.20 MIL/uL   Hemoglobin 12.5 12.0 - 16.0 g/dL   HCT 37.5 35.0 - 47.0 %   MCV 95.1 80.0 - 100.0 fL   MCH 31.7 26.0 - 34.0 pg   MCHC 33.4 32.0 - 36.0 g/dL   RDW 13.3 11.5 - 14.5 %   Platelets 214 150 - 440 K/uL    Comment: Performed at [facility], [address] [place], [place] [address]  Troponin I     Status: None   Collection Time: 10/22/17  9:19 PM  Result Value Ref Range   Troponin I <0.03 <0.03 ng/mL    Comment: Performed at [facility], [address] [place], [place] [address]  TSH     Status: None   Collection Time: 10/23/17  5:11 AM  Result Value Ref Range   TSH 3.109 0.350 - 4.500 uIU/mL    Comment: Performed by a 3rd Generation assay with a functional sensitivity of <=0.01 uIU/mL. Performed at [facility], [place], [place] [address]    Ct Head Wo Contrast  Result Date: 10/22/2017 CLINICAL DATA:  Lost balance in kitchen and fell, struck a tooth and hurt her hip. EXAM: CT HEAD WITHOUT CONTRAST CT MAXILLOFACIAL WITHOUT CONTRAST TECHNIQUE: Multidetector CT imaging of the head and maxillofacial structures were performed using the standard protocol without intravenous contrast. Multiplanar CT image reconstructions of the maxillofacial structures were also generated. COMPARISON:  None. FINDINGS: CT HEAD FINDINGS BRAIN: No intraparenchymal hemorrhage, mass effect nor midline shift. The ventricles and sulci are normal for age. Minimal supratentorial white matter hypodensities less than expected for patient's age, though non-specific  are most compatible with chronic small vessel ischemic disease. No acute large vascular territory infarcts. No abnormal extra-axial fluid collections. Basal cisterns are patent. VASCULAR: Trace calcific atherosclerosis of the carotid siphons. SKULL: No skull fracture. Osteopenia. No significant scalp soft tissue swelling. OTHER: None. CT MAXILLOFACIAL FINDINGS OSSEOUS: The mandible is intact, the condyles are located. No acute facial fracture. Acute avulsion tooth 8 without alveolar ridge fracture. No destructive bony lesions. Grade 1 C3-4 anterolisthesis, moderate to severe C4-5 and moderate C3-4 degenerative discs. Severe LEFT cervical facet arthropathy. Moderate  to severe C3-4 neural foraminal narrowing. ORBITS: Status post bilateral ocular lens implants.  Nonacute. SINUSES: Chronic LEFT sphenoid sinusitis with atresia and bony remodeling. Trace maxillary sinus mucosal thickening. Nasal septum is midline. Bilateral mastoid effusions with RIGHT air cell coalescence. SOFT TISSUES: Mild LEFT premalar and perioral soft tissue swelling without subcutaneous gas or radiopaque foreign bodies. Mild calcific atherosclerosis of the included carotid siphons. IMPRESSION: CT HEAD: 1. Normal noncontrast CT HEAD for age. CT MAXILLOFACIAL: 1. Acute avulsion tooth 8 without facial fracture. 2. Bilateral mastoid effusions and RIGHT coalescent chronic mastoiditis. Electronically Signed   By: Elon Alas M.D.   On: 10/22/2017 22:03   Dg Hip Unilat With Pelvis 2-3 Views Left  Result Date: 10/22/2017 CLINICAL DATA:  Status post fall, with left hip pain. Initial encounter. EXAM: DG HIP (WITH OR WITHOUT PELVIS) 2-3V LEFT COMPARISON:  None. FINDINGS: There is no evidence of fracture or dislocation. Both femoral heads are seated normally within their respective acetabula. The proximal left femur appears intact. No significant degenerative change is appreciated. The sacroiliac joints are unremarkable in appearance. The visualized  bowel gas pattern is grossly unremarkable in appearance. IMPRESSION: No evidence of fracture or dislocation. Electronically Signed   By: Garald Balding M.D.   On: 10/22/2017 22:02   Dg Femur Min 2 Views Left  Result Date: 10/22/2017 CLINICAL DATA:  Left leg pain after fall EXAM: LEFT FEMUR 2 VIEWS COMPARISON:  None. FINDINGS: There is no evidence of fracture or other focal bone lesions. Soft tissues are unremarkable. IMPRESSION: Negative. Electronically Signed   By: Ashley Royalty M.D.   On: 10/22/2017 23:26   Ct Maxillofacial Wo Contrast  Result Date: 10/22/2017 CLINICAL DATA:  Lost balance in kitchen and fell, struck a tooth and hurt her hip. EXAM: CT HEAD WITHOUT CONTRAST CT MAXILLOFACIAL WITHOUT CONTRAST TECHNIQUE: Multidetector CT imaging of the head and maxillofacial structures were performed using the standard protocol without intravenous contrast. Multiplanar CT image reconstructions of the maxillofacial structures were also generated. COMPARISON:  None. FINDINGS: CT HEAD FINDINGS BRAIN: No intraparenchymal hemorrhage, mass effect nor midline shift. The ventricles and sulci are normal for age. Minimal supratentorial white matter hypodensities less than expected for patient's age, though non-specific are most compatible with chronic small vessel ischemic disease. No acute large vascular territory infarcts. No abnormal extra-axial fluid collections. Basal cisterns are patent. VASCULAR: Trace calcific atherosclerosis of the carotid siphons. SKULL: No skull fracture. Osteopenia. No significant scalp soft tissue swelling. OTHER: None. CT MAXILLOFACIAL FINDINGS OSSEOUS: The mandible is intact, the condyles are located. No acute facial fracture. Acute avulsion tooth 8 without alveolar ridge fracture. No destructive bony lesions. Grade 1 C3-4 anterolisthesis, moderate to severe C4-5 and moderate C3-4 degenerative discs. Severe LEFT cervical facet arthropathy. Moderate to severe C3-4 neural foraminal narrowing.  ORBITS: Status post bilateral ocular lens implants.  Nonacute. SINUSES: Chronic LEFT sphenoid sinusitis with atresia and bony remodeling. Trace maxillary sinus mucosal thickening. Nasal septum is midline. Bilateral mastoid effusions with RIGHT air cell coalescence. SOFT TISSUES: Mild LEFT premalar and perioral soft tissue swelling without subcutaneous gas or radiopaque foreign bodies. Mild calcific atherosclerosis of the included carotid siphons. IMPRESSION: CT HEAD: 1. Normal noncontrast CT HEAD for age. CT MAXILLOFACIAL: 1. Acute avulsion tooth 8 without facial fracture. 2. Bilateral mastoid effusions and RIGHT coalescent chronic mastoiditis. Electronically Signed   By: Elon Alas M.D.   On: 10/22/2017 22:03    Review of Systems  Constitutional: Negative for chills and fever.  HENT: Negative for  sore throat and tinnitus.   Eyes: Negative for blurred vision and redness.  Respiratory: Positive for shortness of breath. Negative for cough.   Cardiovascular: Negative for chest pain, palpitations, orthopnea and PND.  Gastrointestinal: Negative for abdominal pain, diarrhea, nausea and vomiting.  Genitourinary: Negative for dysuria, frequency and urgency.  Musculoskeletal: Negative for joint pain and myalgias.  Skin: Negative for rash.       No lesions  Neurological: Positive for loss of consciousness. Negative for dizziness, speech change, focal weakness and weakness.  Endo/Heme/Allergies: Does not bruise/bleed easily.       No temperature intolerance  Psychiatric/Behavioral: Negative for depression and suicidal ideas.    Blood pressure 137/78, pulse 92, temperature 98.3 F (36.8 C), temperature source Oral, resp. rate 18, height '5\' 1"'  (1.549 m), weight 51.2 kg (112 lb 12.8 oz), SpO2 99 %. Physical Exam  Vitals reviewed. Constitutional: She is oriented to person, place, and time. She appears well-developed and well-nourished. No distress.  HENT:  Head: Normocephalic and atraumatic.   Mouth/Throat: Oropharynx is clear and moist.  Eyes: Conjunctivae and EOM are normal. Pupils are equal, round, and reactive to light. No scleral icterus.  Neck: Normal range of motion. Neck supple. No JVD present. No tracheal deviation present. No thyromegaly present.  Cardiovascular: Normal rate, regular rhythm and normal heart sounds. Exam reveals no gallop and no friction rub.  No murmur heard. Respiratory: Effort normal and breath sounds normal.  GI: Soft. Bowel sounds are normal. She exhibits no distension. There is no tenderness.  Genitourinary:  Genitourinary Comments: Deferred  Musculoskeletal: Normal range of motion. She exhibits no edema.  Lymphadenopathy:    She has no cervical adenopathy.  Neurological: She is alert and oriented to person, place, and time. No cranial nerve deficit. She exhibits normal muscle tone.  Skin: Skin is warm and dry. No rash noted. No erythema.  Psychiatric: She has a normal mood and affect. Her behavior is normal. Judgment and thought content normal.     Assessment/Plan This is an 84 year old female admitted for syncope. 1.  Syncope: The patient has had only 2 episodes of syncope in the past, both of which occurred recently.  Echocardiogram performed last week shows only trivial mitral and tricuspid regurgitation with no stenotic valves and normal heart function.  Holter monitor was unremarkable.  Reviewed telemetry alarms and continue to monitor rhythm.  Cardiology consultation. 2.  COPD: Subjectively worse per patient.  Hypoxia may be the underlying etiology of her syncope as she reports decline in her functional status over the last 6 weeks.  Continue long-acting bronchial agonist, and albuterol as needed.  Consider addition of inhaled corticosteroid.  I have added Spiriva.  Continue azithromycin 3.  Hypertension: Controlled; continue losartan 4.  Glaucoma:  Open-angle; restart eyedrops 5.  Mastoid effusions: No indication of infection although fluid  levels may cause sinus congestion/disequilibrium that may contribute to falls.  Trial of Antivert. 6.  Quadriceps tear: Pain management.  Morphine as needed.  PT/OT eval 7.  DVT prophylaxis: Lovenox 8.  GI prophylaxis: None The patient is a DNR.  Time spent on admission orders and patient care approximately 45 minutes  Harrie Foreman, MD 10/23/2017, 7:41 AM

## 2017-10-23 NOTE — Evaluation (Signed)
Physical Therapy Evaluation Patient Details Name: Misty Ferguson MRN: [id number] DOB: September 06, 1933 Today's Date: 10/23/2017   History of Present Illness  The patient is an 84 yo F with past medical history of COPD and glaucoma presents the emergency department after a fall. The patient stated that she fell and subsequently injured her left leg and hip but does not recall the fall. MRI of her lower extremity revealed edema and inflammation of the quadriceps consistent with tear. While in the emergency department the patient had a witnessed episode of syncope that occurred after a trip to the commode as she she sat on the bed. She did not fall nor did she hit her head. This is the second time the patient has had an episode of fainting. She admits to not having any recollection of loss of consciousness. She also denies dizziness or any associated prodrome to losing consciousness. Outpatient workup after her first episode of syncope was unremarkable.  Assessment includes:  Syncope, COPD, HTN, Glaucoma, mastoid effusions, L quadriceps tear.    Clinical Impression  Pt presents with deficits in strength, transfers, mobility, gait, balance, and activity tolerance.   Patient suffers from torn left quadriceps muscles which impairs her ability to perform daily activities like toileting, feeding, dressing, grooming, and bathing in the home. A walker will not resolve the patient's issue with performing activities of daily living. A wheelchair is required/recommended and will allow patient to safely perform daily activities.  Patient can safely propel the wheelchair in the home or has a caregiver who can provide assistance.  Pt required extensive assistance with bed mobility and transfers and was only able to take several very small steps with a RW.  Pt ambulated with step-to pattern with cues provided for TTWB sequencing to the LLE to assist with pain control with ambulation.  Pt very anxious during session with  frequent cues for relaxed breathing technique along with encouragement provided.  Pt's baseline vitals in supine include: SpO2 96%, HR 102 bpm, and BP 126/73 mmHg.  Upon sitting pt's BP was 147/81 mmHg with no adverse symptoms other than LLE pain with movement.  After limited amb pt's BP taken again in sitting at 132/77 mmHg with SpO2 95% and HR 120 bpm.  Pt will benefit from PT services in a SNF setting upon discharge to safely address above deficits for decreased caregiver assistance and eventual return to PLOF.       Follow Up Recommendations SNF    Equipment Recommendations  Other (comment);Rolling walker with 5" wheels;3in1 (PT);Wheelchair (measurements PT)(TBD at next venue of care if pt discharges to SNF)    Recommendations for Other Services       Precautions / Restrictions Precautions Precautions: Fall Restrictions Weight Bearing Restrictions: No Other Position/Activity Restrictions: No WB restrictions seen in chart      Mobility  Bed Mobility Overal bed mobility: Needs Assistance Bed Mobility: Supine to Sit;Sit to Supine;Rolling Rolling: Mod assist   Supine to sit: Mod assist Sit to supine: Mod assist   General bed mobility comments: Mod A for LLE in and out of bed  Transfers Overall transfer level: Needs assistance Equipment used: Rolling walker (2 wheeled) Transfers: Sit to/from Stand Sit to Stand: Mod assist         General transfer comment: Mod verbal and tactile cues for sequencing  Ambulation/Gait Ambulation/Gait assistance: Min assist Ambulation Distance (Feet): 2 Feet Assistive device: Rolling walker (2 wheeled) Gait Pattern/deviations: Step-to pattern   Gait velocity interpretation: Below normal speed for  age/gender General Gait Details: Pt able to take 3-4 very small steps at EOB with step-to pattern with cues for sequencing to limit LLE WB for pain control   Stairs            Wheelchair Mobility    Modified Rankin (Stroke Patients  Only)       Balance Overall balance assessment: Needs assistance Sitting-balance support: Feet unsupported;Feet supported Sitting balance-Leahy Scale: Good     Standing balance support: Bilateral upper extremity supported Standing balance-Leahy Scale: Fair                               Pertinent Vitals/Pain Pain Assessment: 0-10 Pain Score: 2  Pain Location: L quad Pain Descriptors / Indicators: Aching;Sore Pain Intervention(s): Monitored during session;Limited activity within patient's tolerance    Home Living Family/patient expects to be discharged to:: Private residence Living Arrangements: Spouse/significant other Available Help at Discharge: Family;Available 24 hours/day Type of Home: House Home Access: Level entry     Home Layout: One level Home Equipment: Cane - single point;Crutches      Prior Function Level of Independence: Independent         Comments: Ind amb without AD community distances, Ind with ADLs, no other falls other than recent fall secodnary to syncope     Hand Dominance        Extremity/Trunk Assessment   Upper Extremity Assessment Upper Extremity Assessment: Overall WFL for tasks assessed    Lower Extremity Assessment Lower Extremity Assessment: Generalized weakness;LLE deficits/detail LLE: Unable to fully assess due to pain       Communication   Communication: No difficulties  Cognition Arousal/Alertness: Awake/alert Behavior During Therapy: WFL for tasks assessed/performed Overall Cognitive Status: Within Functional Limits for tasks assessed                                        General Comments      Exercises Total Joint Exercises Ankle Circles/Pumps: AROM;Both;5 reps;10 reps Quad Sets: Strengthening;Both;5 reps;10 reps(Gentle on the LLE ) Gluteal Sets: Strengthening;Both;5 reps;10 reps Heel Slides: AROM;Right;10 reps Hip ABduction/ADduction: AROM;Right;10 reps Straight Leg Raises:  AROM;Right;10 reps;Other (comment)(Gentle isometric contraction on the LLE x 10) Long Arc Quad: AROM;Both;5 reps;10 reps(Gentle, low amplitude outside of the range of pain on the LLE) Knee Flexion: AROM;Both;5 reps;10 reps(Gentle, low amplitude outside of the range of pain on the LLE) Other Exercises Other Exercises: Log roll technique training for bed mobility Other Exercises: HEP education and review including BLE APs, QS gentle on the LLE, and GS Other Exercises: Relaxation breathing techniques during session   Assessment/Plan    PT Assessment Patient needs continued PT services  PT Problem List Decreased strength;Decreased activity tolerance;Decreased balance;Decreased mobility;Decreased knowledge of use of DME       PT Treatment Interventions DME instruction;Gait training;Functional mobility training;Balance training;Therapeutic exercise;Therapeutic activities;Patient/family education    PT Goals (Current goals can be found in the Care Plan section)  Acute Rehab PT Goals Patient Stated Goal: To walk better PT Goal Formulation: With patient Time For Goal Achievement: 11/05/17 Potential to Achieve Goals: Good    Frequency Min 2X/week   Barriers to discharge        Co-evaluation               AM-PAC PT "6 Clicks" Daily Activity  Outcome Measure Difficulty turning  over in bed (including adjusting bedclothes, sheets and blankets)?: Unable Difficulty moving from lying on back to sitting on the side of the bed? : Unable Difficulty sitting down on and standing up from a chair with arms (e.g., wheelchair, bedside commode, etc,.)?: Unable Help needed moving to and from a bed to chair (including a wheelchair)?: A Lot Help needed walking in hospital room?: Total Help needed climbing 3-5 steps with a railing? : Total 6 Click Score: 7    End of Session Equipment Utilized During Treatment: Gait belt;Oxygen Activity Tolerance: Patient limited by pain Patient left: in bed;with  call bell/phone within reach;with bed alarm set;with family/visitor present Nurse Communication: Mobility status PT Visit Diagnosis: Difficulty in walking, not elsewhere classified (R26.2);Muscle weakness (generalized) (M62.81);Pain Pain - Right/Left: Left Pain - part of body: Leg    Time: 3612-2449 PT Time Calculation (min) (ACUTE ONLY): 53 min   Charges:   PT Evaluation $PT Eval Low Complexity: 1 Low PT Treatments $Therapeutic Exercise: 8-22 mins $Therapeutic Activity: 8-22 mins   PT G Codes:        D. Royetta Asal PT, DPT 10/23/17, 10:30 AM

## 2017-10-24 ENCOUNTER — Observation Stay: Payer: Medicare HMO

## 2017-10-24 DIAGNOSIS — R55 Syncope and collapse: Secondary | ICD-10-CM | POA: Diagnosis not present

## 2017-10-24 DIAGNOSIS — I10 Essential (primary) hypertension: Secondary | ICD-10-CM | POA: Diagnosis not present

## 2017-10-24 DIAGNOSIS — H409 Unspecified glaucoma: Secondary | ICD-10-CM | POA: Diagnosis not present

## 2017-10-24 DIAGNOSIS — R079 Chest pain, unspecified: Secondary | ICD-10-CM | POA: Diagnosis not present

## 2017-10-24 DIAGNOSIS — J449 Chronic obstructive pulmonary disease, unspecified: Secondary | ICD-10-CM | POA: Diagnosis not present

## 2017-10-24 MED ORDER — HYDROCODONE-ACETAMINOPHEN 5-325 MG PO TABS
1.0000 | ORAL_TABLET | ORAL | Status: DC | PRN
  Administered 2017-10-24: 1 via ORAL
  Administered 2017-10-24 – 2017-10-25 (×2): 2 via ORAL
  Administered 2017-10-25: 1 via ORAL
  Filled 2017-10-24: qty 2
  Filled 2017-10-24: qty 1
  Filled 2017-10-24 (×2): qty 2

## 2017-10-24 MED ORDER — TIOTROPIUM BROMIDE-OLODATEROL 2.5-2.5 MCG/ACT IN AERS
2.0000 | INHALATION_SPRAY | Freq: Every day | RESPIRATORY_TRACT | Status: DC
  Administered 2017-10-24: 2 via RESPIRATORY_TRACT
  Filled 2017-10-24: qty 4

## 2017-10-24 MED ORDER — NON FORMULARY: 2.0000 | Freq: Every day | Status: DC

## 2017-10-24 MED ORDER — SODIUM CHLORIDE 0.9% FLUSH
3.0000 mL | Freq: Two times a day (BID) | INTRAVENOUS | Status: DC
  Administered 2017-10-24 – 2017-10-25 (×2): 3 mL via INTRAVENOUS

## 2017-10-24 NOTE — Progress Notes (Signed)
[facility] [place] NAME: Misty Ferguson    MR#:  [id number]  DATE OF BIRTH:  1933/11/18  SUBJECTIVE:   No further episodes of syncope overnight, still complaining of left leg pain. Seen by physical therapy and recommended short-term rehabilitation. Patient is in agreement and social work working on placement.  REVIEW OF SYSTEMS:    Review of Systems  Constitutional: Negative for chills and fever.  HENT: Negative for congestion and tinnitus.   Eyes: Negative for blurred vision and double vision.  Respiratory: Negative for cough, shortness of breath and wheezing.   Cardiovascular: Negative for chest pain, orthopnea and PND.  Gastrointestinal: Negative for abdominal pain, diarrhea, nausea and vomiting.  Genitourinary: Negative for dysuria and hematuria.  Musculoskeletal: Positive for falls and joint pain (Left Leg).  Neurological: Negative for dizziness, sensory change and focal weakness.  All other systems reviewed and are negative.   Nutrition: Heart Healthy Tolerating Diet: Yes Tolerating PT: Eval noted.   DRUG ALLERGIES:  No Known Allergies  VITALS:  Blood pressure (!) 160/81, pulse 90, temperature 97.9 F (36.6 C), temperature source Oral, resp. rate 18, height 5\' 1"  (1.549 m), weight 52.9 kg (116 lb 9.6 oz), SpO2 100 %.  PHYSICAL EXAMINATION:   Physical Exam  GENERAL:  84 y.o.-year-old patient lying in bed in no acute distress.  EYES: Pupils equal, round, reactive to light and accommodation. No scleral icterus. Extraocular muscles intact.  HEENT: Head atraumatic, normocephalic. Oropharynx and nasopharynx clear.  NECK:  Supple, no jugular venous distention. No thyroid enlargement, no tenderness.  LUNGS: Normal breath sounds bilaterally, no wheezing, rales, rhonchi. No use of accessory muscles of respiration.  CARDIOVASCULAR: S1, S2 normal. No murmurs, rubs, or gallops.  ABDOMEN: Soft, nontender, nondistended. Bowel sounds  present. No organomegaly or mass.  EXTREMITIES: No cyanosis, clubbing, left leg edema 1+ greater than right.    NEUROLOGIC: Cranial nerves II through XII are intact. No focal Motor or sensory deficits b/l.   PSYCHIATRIC: The patient is alert and oriented x 3.  SKIN: No obvious rash, lesion, or ulcer.     LABORATORY PANEL:   CBC Recent Labs  Lab 10/22/17 2119  WBC 6.2  HGB 12.5  HCT 37.5  PLT 214   ------------------------------------------------------------------------------------------------------------------  Chemistries  Recent Labs  Lab 10/22/17 2119  NA 139  K 4.2  CL 103  CO2 24  GLUCOSE 109*  BUN 18  CREATININE 0.90  CALCIUM 9.4  AST 29  ALT 21  ALKPHOS 48  BILITOT 0.8   ------------------------------------------------------------------------------------------------------------------  Cardiac Enzymes Recent Labs  Lab 10/22/17 2119  TROPONINI <0.03   ------------------------------------------------------------------------------------------------------------------  RADIOLOGY:  Ct Head Wo Contrast  Result Date: 10/22/2017 CLINICAL DATA:  Lost balance in kitchen and fell, struck a tooth and hurt her hip. EXAM: CT HEAD WITHOUT CONTRAST CT MAXILLOFACIAL WITHOUT CONTRAST TECHNIQUE: Multidetector CT imaging of the head and maxillofacial structures were performed using the standard protocol without intravenous contrast. Multiplanar CT image reconstructions of the maxillofacial structures were also generated. COMPARISON:  None. FINDINGS: CT HEAD FINDINGS BRAIN: No intraparenchymal hemorrhage, mass effect nor midline shift. The ventricles and sulci are normal for age. Minimal supratentorial white matter hypodensities less than expected for patient's age, though non-specific are most compatible with chronic small vessel ischemic disease. No acute large vascular territory infarcts. No abnormal extra-axial fluid collections. Basal cisterns are patent. VASCULAR: Trace  calcific atherosclerosis of the carotid siphons. SKULL: No skull fracture. Osteopenia. No significant scalp soft tissue  swelling. OTHER: None. CT MAXILLOFACIAL FINDINGS OSSEOUS: The mandible is intact, the condyles are located. No acute facial fracture. Acute avulsion tooth 8 without alveolar ridge fracture. No destructive bony lesions. Grade 1 C3-4 anterolisthesis, moderate to severe C4-5 and moderate C3-4 degenerative discs. Severe LEFT cervical facet arthropathy. Moderate to severe C3-4 neural foraminal narrowing. ORBITS: Status post bilateral ocular lens implants.  Nonacute. SINUSES: Chronic LEFT sphenoid sinusitis with atresia and bony remodeling. Trace maxillary sinus mucosal thickening. Nasal septum is midline. Bilateral mastoid effusions with RIGHT air cell coalescence. SOFT TISSUES: Mild LEFT premalar and perioral soft tissue swelling without subcutaneous gas or radiopaque foreign bodies. Mild calcific atherosclerosis of the included carotid siphons. IMPRESSION: CT HEAD: 1. Normal noncontrast CT HEAD for age. CT MAXILLOFACIAL: 1. Acute avulsion tooth 8 without facial fracture. 2. Bilateral mastoid effusions and RIGHT coalescent chronic mastoiditis. Electronically Signed   By: Elon Alas M.D.   On: 10/22/2017 22:03   Mr Hip Left Wo Contrast  Result Date: 10/23/2017 CLINICAL DATA:  Left hip pain after fall.  Negative x-rays. EXAM: MR OF THE LEFT HIP WITHOUT CONTRAST TECHNIQUE: Multiplanar, multisequence MR imaging was performed. No intravenous contrast was administered. COMPARISON:  Left hip x-rays from same day. FINDINGS: Bones: There is no evidence of acute fracture, dislocation or avascular necrosis. The visualized bony pelvis appears normal. The visualized sacroiliac joints and symphysis pubis appear normal. Articular cartilage and labrum Articular cartilage: No focal chondral defect or subchondral signal abnormality identified. Labrum: There is no gross labral tear or paralabral abnormality.  Joint or bursal effusion Joint effusion: No significant hip joint effusion. Bursae: No focal periarticular fluid collection. Muscles and tendons Muscles and tendons: There is prominent edema within the visualized proximal vastus lateralis, vastus intermedius, and vastus medialis muscles, which are slightly atrophic when compared to the right. There is low-grade partial tearing of the right hamstring tendon origin. The visualized gluteus, iliopsoas, and left hamstring tendons appear normal. The piriformis muscles appear symmetric. Other findings Miscellaneous: The visualized internal pelvic contents appear unremarkable. Small amount of subcutaneous edema over the lateral upper thigh. IMPRESSION: 1.  No acute osseous abnormality. 2. Prominent edema within the visualized proximal left vastus lateralis, vastus intermedius, and vastus medialis muscles. While this may reflect contusion from direct trauma or severe muscle strain, given that these muscles are slightly atrophic when compared to the right, denervation should also be considered. Electronically Signed   By: Titus Dubin M.D.   On: 10/23/2017 08:13   Dg Chest Port 1 View  Result Date: 10/24/2017 CLINICAL DATA:  Chest pain EXAM: PORTABLE CHEST 1 VIEW COMPARISON:  09/08/2017 FINDINGS: Cardiac shadow is within normal limits. Aortic calcifications are again noted. The lungs are well aerated. Very minimal platelike atelectasis is noted in the lateral left lung base. No bony abnormality is seen. IMPRESSION: Minimal left basilar platelike atelectasis Electronically Signed   By: Inez Catalina M.D.   On: 10/24/2017 14:28   Dg Hip Unilat With Pelvis 2-3 Views Left  Result Date: 10/22/2017 CLINICAL DATA:  Status post fall, with left hip pain. Initial encounter. EXAM: DG HIP (WITH OR WITHOUT PELVIS) 2-3V LEFT COMPARISON:  None. FINDINGS: There is no evidence of fracture or dislocation. Both femoral heads are seated normally within their respective acetabula. The  proximal left femur appears intact. No significant degenerative change is appreciated. The sacroiliac joints are unremarkable in appearance. The visualized bowel gas pattern is grossly unremarkable in appearance. IMPRESSION: No evidence of fracture or dislocation. Electronically Signed  By: Garald Balding M.D.   On: 10/22/2017 22:02   Dg Femur Min 2 Views Left  Result Date: 10/22/2017 CLINICAL DATA:  Left leg pain after fall EXAM: LEFT FEMUR 2 VIEWS COMPARISON:  None. FINDINGS: There is no evidence of fracture or other focal bone lesions. Soft tissues are unremarkable. IMPRESSION: Negative. Electronically Signed   By: Ashley Royalty M.D.   On: 10/22/2017 23:26   Ct Maxillofacial Wo Contrast  Result Date: 10/22/2017 CLINICAL DATA:  Lost balance in kitchen and fell, struck a tooth and hurt her hip. EXAM: CT HEAD WITHOUT CONTRAST CT MAXILLOFACIAL WITHOUT CONTRAST TECHNIQUE: Multidetector CT imaging of the head and maxillofacial structures were performed using the standard protocol without intravenous contrast. Multiplanar CT image reconstructions of the maxillofacial structures were also generated. COMPARISON:  None. FINDINGS: CT HEAD FINDINGS BRAIN: No intraparenchymal hemorrhage, mass effect nor midline shift. The ventricles and sulci are normal for age. Minimal supratentorial white matter hypodensities less than expected for patient's age, though non-specific are most compatible with chronic small vessel ischemic disease. No acute large vascular territory infarcts. No abnormal extra-axial fluid collections. Basal cisterns are patent. VASCULAR: Trace calcific atherosclerosis of the carotid siphons. SKULL: No skull fracture. Osteopenia. No significant scalp soft tissue swelling. OTHER: None. CT MAXILLOFACIAL FINDINGS OSSEOUS: The mandible is intact, the condyles are located. No acute facial fracture. Acute avulsion tooth 8 without alveolar ridge fracture. No destructive bony lesions. Grade 1 C3-4  anterolisthesis, moderate to severe C4-5 and moderate C3-4 degenerative discs. Severe LEFT cervical facet arthropathy. Moderate to severe C3-4 neural foraminal narrowing. ORBITS: Status post bilateral ocular lens implants.  Nonacute. SINUSES: Chronic LEFT sphenoid sinusitis with atresia and bony remodeling. Trace maxillary sinus mucosal thickening. Nasal septum is midline. Bilateral mastoid effusions with RIGHT air cell coalescence. SOFT TISSUES: Mild LEFT premalar and perioral soft tissue swelling without subcutaneous gas or radiopaque foreign bodies. Mild calcific atherosclerosis of the included carotid siphons. IMPRESSION: CT HEAD: 1. Normal noncontrast CT HEAD for age. CT MAXILLOFACIAL: 1. Acute avulsion tooth 8 without facial fracture. 2. Bilateral mastoid effusions and RIGHT coalescent chronic mastoiditis. Electronically Signed   By: Elon Alas M.D.   On: 10/22/2017 22:03     ASSESSMENT AND PLAN:   84 year old female with past medical history of hypertension, diabetes, macular degeneration, osteoporosis, COPD who presents to the hospital after a syncopal episode and complaining of left leg pain.  1. Syncope-patient has had recurrent syncope since yesterday. She denies any prodromal symptoms prior to her episode. She has been worked up in the past with a 48-hour Holter monitor which was negative. -Her neurologic workup Has been negative. Seen by cardiology and the plan on doing a 30 day event monitor. - no alarms on tele overnight and had recent Echo which was normal.   2. Left leg pain-secondary to left quadriceps muscle tear/contusion from her recent fall. -Supportive care with pain control with oral Percocet -Seen by physical therapy and recommended short-term rehabilitation and social work working on placement.  3. History of COPD-no acute exacerbation. Patient does not want to Spiriva which he discontinued, and she wants to use her home inhaler which I have notified nurse to let  pharmacy labels so she can use it. -Continue albuterol nebulizers.  4. HTN - cont. Losartan  5. Hx of Vertigo - cont. Meclizine  6. Atypical right-sided chest pain-chest x-ray obtained which shows no evidence of rib fractures. Continue supportive care with pain control.   All the records are reviewed and  case discussed with Care Management/Social Worker. Management plans discussed with the patient, family and they are in agreement.  CODE STATUS: DNR  DVT Prophylaxis: Lovenox  TOTAL TIME TAKING CARE OF THIS PATIENT: 30 minutes.   POSSIBLE D/C IN 1-2 DAYS, DEPENDING ON CLINICAL CONDITION.   Henreitta Leber M.D on 10/24/2017 at 2:37 PM  Between 7am to 6pm - Pager - [phone]  After 6pm go to [email or web address] - Proofreader  [facility]  Office  [phone]  CC: Primary care physician; [name], MD

## 2017-10-24 NOTE — Plan of Care (Signed)
Refusing scheduled vitamin, calcium, aspirin and docusate tabs

## 2017-10-24 NOTE — Care Management (Signed)
Patient is now pursuing skilled nursing facility placement and CSW has initiated bed search.  her medicare [facility] will require auth

## 2017-10-24 NOTE — Clinical Social Work Note (Signed)
CSW has contacted Medicare [employer] which is what insurance the patient has. Patient's coverage for Home Health is $0 copay and for STR coverage it is a $0 copay for days 1-20. CSW informed patient of this and she gave permission to begin auth with [facility] for going to [facility]. She states that depending on PT re-assessment she may decide to return home with home health but is in agreement to begin the SNF auth process due to it potentially taking a long time. She is aware that this process can be discontinued and cancelled if she chooses to return home. Shela Leff MSW,LCSW [phone]

## 2017-10-24 NOTE — Progress Notes (Signed)
Physical Therapy Treatment Patient Details Name: Misty Ferguson MRN: [id number] DOB: Jan 07, 1934 Today's Date: 10/24/2017    History of Present Illness The patient is an 84 yo F with past medical history of COPD and glaucoma presents the emergency department after a fall. The patient stated that she fell and subsequently injured her left leg and hip but does not recall the fall. MRI of her lower extremity revealed edema and inflammation of the quadriceps consistent with tear. While in the emergency department the patient had a witnessed episode of syncope that occurred after a trip to the commode as she she sat on the bed. She did not fall nor did she hit her head. This is the second time the patient has had an episode of fainting. She admits to not having any recollection of loss of consciousness. She also denies dizziness or any associated prodrome to losing consciousness. Outpatient workup after her first episode of syncope was unremarkable.  Assessment includes:  Syncope, COPD, HTN, Glaucoma, mastoid effusions, L quadriceps tear.    PT Comments    Pt eager to see if she can do better and be safe at home.  She reports she has been very good and consistent with doing HEP (quad sets, ankle pumps, glute squeezes) and wants to walk further today. She did do better with most aspects of PT treatment, but generally was pain limited and despite being able to walk ~20 ft with walker was very slow, guarded (especially with L LE WBing - though still improved with increased distance) and she became very fatigued (with O2 drop and HR increase) with the limited distance and does realize that she would not be safe at home even with assistance.  Pt will need STR on discharge.   Follow Up Recommendations  SNF     Equipment Recommendations       Recommendations for Other Services       Precautions / Restrictions Precautions Precautions: Fall Restrictions Weight Bearing Restrictions: No     Mobility  Bed Mobility Overal bed mobility: Needs Assistance Bed Mobility: Rolling Rolling: Min assist   Supine to sit: Min assist     General bed mobility comments: Pt able to get to sitting EOB w/o heavy assit  Transfers Overall transfer level: Needs assistance Equipment used: Rolling walker (2 wheeled) Transfers: Sit to/from Stand Sit to Stand: Mod assist         General transfer comment: Pt struggles to get to standing, heavily reliant on L LE with limited R WBing intially  Ambulation/Gait Ambulation/Gait assistance: Min assist Ambulation Distance (Feet): 20 Feet Assistive device: Rolling walker (2 wheeled)       General Gait Details: Pt able to do much more than yesterday but is still very functionally limited.  She was on 3 liters during the effort and sats dropped to low 80s, pt was sweating, pale and generally looked poorly after the effort. Highly reliant on walker, but able to put increased amount of weight though L with increased standing time   Stairs            Wheelchair Mobility    Modified Rankin (Stroke Patients Only)       Balance Overall balance assessment: Needs assistance   Sitting balance-Leahy Scale: Good     Standing balance support: Bilateral upper extremity supported Standing balance-Leahy Scale: Fair Standing balance comment: highly reliant on walker, inconsistent confidence with L WBing  Cognition Arousal/Alertness: Awake/alert Behavior During Therapy: WFL for tasks assessed/performed Overall Cognitive Status: Within Functional Limits for tasks assessed                                        Exercises General Exercises - Lower Extremity Ankle Circles/Pumps: AROM Quad Sets: 15 reps Gluteal Sets: Strengthening;10 reps Short Arc Quad: AAROM;AROM;10 reps Heel Slides: AAROM;5 reps Hip ABduction/ADduction: Strengthening;10 reps    General Comments         Pertinent Vitals/Pain Pain Assessment: 0-10 Pain Score: 6  Pain Location: L thigh (more laterally) pain increases with any activity    Home Living                      Prior Function            PT Goals (current goals can now be found in the care plan section) Progress towards PT goals: Progressing toward goals    Frequency    Min 2X/week      PT Plan Current plan remains appropriate    Co-evaluation              AM-PAC PT "6 Clicks" Daily Activity  Outcome Measure  Difficulty turning over in bed (including adjusting bedclothes, sheets and blankets)?: A Little Difficulty moving from lying on back to sitting on the side of the bed? : Unable Difficulty sitting down on and standing up from a chair with arms (e.g., wheelchair, bedside commode, etc,.)?: Unable Help needed moving to and from a bed to chair (including a wheelchair)?: A Lot Help needed walking in hospital room?: Total Help needed climbing 3-5 steps with a railing? : Total 6 Click Score: 9    End of Session Equipment Utilized During Treatment: Gait belt;Oxygen Activity Tolerance: Patient limited by pain;Patient limited by fatigue Patient left: with chair alarm set;with call bell/phone within reach;with family/visitor present Nurse Communication: Mobility status PT Visit Diagnosis: Difficulty in walking, not elsewhere classified (R26.2);Muscle weakness (generalized) (M62.81);Pain Pain - Right/Left: Left Pain - part of body: Leg     Time: 7353-2992 PT Time Calculation (min) (ACUTE ONLY): 41 min  Charges:  $Gait Training: 8-22 mins $Therapeutic Exercise: 8-22 mins $Therapeutic Activity: 8-22 mins                    G Codes:       Kreg Shropshire, DPT 10/24/2017, 1:22 PM

## 2017-10-24 NOTE — Progress Notes (Signed)
[facility] Cardiology  SUBJECTIVE: Patient laying in bed, denies chest pain, shortness of breath or recurrent presyncope/syncope   Vitals:   10/23/17 1914 10/23/17 2117 10/24/17 0355 10/24/17 0735  BP: 140/72  (!) 141/73 (!) 160/81  Pulse: 84  90 90  Resp:    18  Temp: (!) 97.4 F (36.3 C)  98.1 F (36.7 C) 97.9 F (36.6 C)  TempSrc: Oral  Oral Oral  SpO2: 100% 97% 99% 100%  Weight:   52.9 kg (116 lb 9.6 oz)   Height:         Intake/Output Summary (Last 24 hours) at 10/24/2017 1761 Last data filed at 10/24/2017 0124 Gross per 24 hour  Intake 480 ml  Output 150 ml  Net 330 ml      PHYSICAL EXAM  General: Well developed, well nourished, in no acute distress HEENT:  Normocephalic and atramatic Neck:  No JVD.  Lungs: Clear bilaterally to auscultation and percussion. Heart: HRRR . Normal S1 and S2 without gallops or murmurs.  Abdomen: Bowel sounds are positive, abdomen soft and non-tender  Msk:  Back normal, normal gait. Normal strength and tone for age. Extremities: No clubbing, cyanosis or edema.   Neuro: Alert and oriented X 3. Psych:  Good affect, responds appropriately   LABS: Basic Metabolic Panel: Recent Labs    10/22/17 2119  NA 139  K 4.2  CL 103  CO2 24  GLUCOSE 109*  BUN 18  CREATININE 0.90  CALCIUM 9.4   Liver Function Tests: Recent Labs    10/22/17 2119  AST 29  ALT 21  ALKPHOS 48  BILITOT 0.8  PROT 6.8  ALBUMIN 4.3   No results for input(s): LIPASE, AMYLASE in the last 72 hours. CBC: Recent Labs    10/22/17 2119  WBC 6.2  HGB 12.5  HCT 37.5  MCV 95.1  PLT 214   Cardiac Enzymes: Recent Labs    10/22/17 2119  TROPONINI <0.03   BNP: Invalid input(s): POCBNP D-Dimer: No results for input(s): DDIMER in the last 72 hours. Hemoglobin A1C: No results for input(s): HGBA1C in the last 72 hours. Fasting Lipid Panel: No results for input(s): CHOL, HDL, LDLCALC, TRIG, CHOLHDL, LDLDIRECT in the last 72 hours. Thyroid Function  Tests: Recent Labs    10/23/17 0511  TSH 3.109   Anemia Panel: No results for input(s): VITAMINB12, FOLATE, FERRITIN, TIBC, IRON, RETICCTPCT in the last 72 hours.  Ct Head Wo Contrast  Result Date: 10/22/2017 CLINICAL DATA:  Lost balance in kitchen and fell, struck a tooth and hurt her hip. EXAM: CT HEAD WITHOUT CONTRAST CT MAXILLOFACIAL WITHOUT CONTRAST TECHNIQUE: Multidetector CT imaging of the head and maxillofacial structures were performed using the standard protocol without intravenous contrast. Multiplanar CT image reconstructions of the maxillofacial structures were also generated. COMPARISON:  None. FINDINGS: CT HEAD FINDINGS BRAIN: No intraparenchymal hemorrhage, mass effect nor midline shift. The ventricles and sulci are normal for age. Minimal supratentorial white matter hypodensities less than expected for patient's age, though non-specific are most compatible with chronic small vessel ischemic disease. No acute large vascular territory infarcts. No abnormal extra-axial fluid collections. Basal cisterns are patent. VASCULAR: Trace calcific atherosclerosis of the carotid siphons. SKULL: No skull fracture. Osteopenia. No significant scalp soft tissue swelling. OTHER: None. CT MAXILLOFACIAL FINDINGS OSSEOUS: The mandible is intact, the condyles are located. No acute facial fracture. Acute avulsion tooth 8 without alveolar ridge fracture. No destructive bony lesions. Grade 1 C3-4 anterolisthesis, moderate to severe C4-5 and moderate C3-4 degenerative  discs. Severe LEFT cervical facet arthropathy. Moderate to severe C3-4 neural foraminal narrowing. ORBITS: Status post bilateral ocular lens implants.  Nonacute. SINUSES: Chronic LEFT sphenoid sinusitis with atresia and bony remodeling. Trace maxillary sinus mucosal thickening. Nasal septum is midline. Bilateral mastoid effusions with RIGHT air cell coalescence. SOFT TISSUES: Mild LEFT premalar and perioral soft tissue swelling without subcutaneous  gas or radiopaque foreign bodies. Mild calcific atherosclerosis of the included carotid siphons. IMPRESSION: CT HEAD: 1. Normal noncontrast CT HEAD for age. CT MAXILLOFACIAL: 1. Acute avulsion tooth 8 without facial fracture. 2. Bilateral mastoid effusions and RIGHT coalescent chronic mastoiditis. Electronically Signed   By: Elon Alas M.D.   On: 10/22/2017 22:03   Mr Hip Left Wo Contrast  Result Date: 10/23/2017 CLINICAL DATA:  Left hip pain after fall.  Negative x-rays. EXAM: MR OF THE LEFT HIP WITHOUT CONTRAST TECHNIQUE: Multiplanar, multisequence MR imaging was performed. No intravenous contrast was administered. COMPARISON:  Left hip x-rays from same day. FINDINGS: Bones: There is no evidence of acute fracture, dislocation or avascular necrosis. The visualized bony pelvis appears normal. The visualized sacroiliac joints and symphysis pubis appear normal. Articular cartilage and labrum Articular cartilage: No focal chondral defect or subchondral signal abnormality identified. Labrum: There is no gross labral tear or paralabral abnormality. Joint or bursal effusion Joint effusion: No significant hip joint effusion. Bursae: No focal periarticular fluid collection. Muscles and tendons Muscles and tendons: There is prominent edema within the visualized proximal vastus lateralis, vastus intermedius, and vastus medialis muscles, which are slightly atrophic when compared to the right. There is low-grade partial tearing of the right hamstring tendon origin. The visualized gluteus, iliopsoas, and left hamstring tendons appear normal. The piriformis muscles appear symmetric. Other findings Miscellaneous: The visualized internal pelvic contents appear unremarkable. Small amount of subcutaneous edema over the lateral upper thigh. IMPRESSION: 1.  No acute osseous abnormality. 2. Prominent edema within the visualized proximal left vastus lateralis, vastus intermedius, and vastus medialis muscles. While this may  reflect contusion from direct trauma or severe muscle strain, given that these muscles are slightly atrophic when compared to the right, denervation should also be considered. Electronically Signed   By: Titus Dubin M.D.   On: 10/23/2017 08:13   Dg Hip Unilat With Pelvis 2-3 Views Left  Result Date: 10/22/2017 CLINICAL DATA:  Status post fall, with left hip pain. Initial encounter. EXAM: DG HIP (WITH OR WITHOUT PELVIS) 2-3V LEFT COMPARISON:  None. FINDINGS: There is no evidence of fracture or dislocation. Both femoral heads are seated normally within their respective acetabula. The proximal left femur appears intact. No significant degenerative change is appreciated. The sacroiliac joints are unremarkable in appearance. The visualized bowel gas pattern is grossly unremarkable in appearance. IMPRESSION: No evidence of fracture or dislocation. Electronically Signed   By: Garald Balding M.D.   On: 10/22/2017 22:02   Dg Femur Min 2 Views Left  Result Date: 10/22/2017 CLINICAL DATA:  Left leg pain after fall EXAM: LEFT FEMUR 2 VIEWS COMPARISON:  None. FINDINGS: There is no evidence of fracture or other focal bone lesions. Soft tissues are unremarkable. IMPRESSION: Negative. Electronically Signed   By: Ashley Royalty M.D.   On: 10/22/2017 23:26   Ct Maxillofacial Wo Contrast  Result Date: 10/22/2017 CLINICAL DATA:  Lost balance in kitchen and fell, struck a tooth and hurt her hip. EXAM: CT HEAD WITHOUT CONTRAST CT MAXILLOFACIAL WITHOUT CONTRAST TECHNIQUE: Multidetector CT imaging of the head and maxillofacial structures were performed using the standard protocol  without intravenous contrast. Multiplanar CT image reconstructions of the maxillofacial structures were also generated. COMPARISON:  None. FINDINGS: CT HEAD FINDINGS BRAIN: No intraparenchymal hemorrhage, mass effect nor midline shift. The ventricles and sulci are normal for age. Minimal supratentorial white matter hypodensities less than expected for  patient's age, though non-specific are most compatible with chronic small vessel ischemic disease. No acute large vascular territory infarcts. No abnormal extra-axial fluid collections. Basal cisterns are patent. VASCULAR: Trace calcific atherosclerosis of the carotid siphons. SKULL: No skull fracture. Osteopenia. No significant scalp soft tissue swelling. OTHER: None. CT MAXILLOFACIAL FINDINGS OSSEOUS: The mandible is intact, the condyles are located. No acute facial fracture. Acute avulsion tooth 8 without alveolar ridge fracture. No destructive bony lesions. Grade 1 C3-4 anterolisthesis, moderate to severe C4-5 and moderate C3-4 degenerative discs. Severe LEFT cervical facet arthropathy. Moderate to severe C3-4 neural foraminal narrowing. ORBITS: Status post bilateral ocular lens implants.  Nonacute. SINUSES: Chronic LEFT sphenoid sinusitis with atresia and bony remodeling. Trace maxillary sinus mucosal thickening. Nasal septum is midline. Bilateral mastoid effusions with RIGHT air cell coalescence. SOFT TISSUES: Mild LEFT premalar and perioral soft tissue swelling without subcutaneous gas or radiopaque foreign bodies. Mild calcific atherosclerosis of the included carotid siphons. IMPRESSION: CT HEAD: 1. Normal noncontrast CT HEAD for age. CT MAXILLOFACIAL: 1. Acute avulsion tooth 8 without facial fracture. 2. Bilateral mastoid effusions and RIGHT coalescent chronic mastoiditis. Electronically Signed   By: Elon Alas M.D.   On: 10/22/2017 22:03     Echo   TELEMETRY: Sinus rhythm:  ASSESSMENT AND PLAN:  Active Problems:   Syncope    1.  Syncope of uncertain etiology, with recent unremarkable 48-hour Holter monitor, and normal left ventricular function on 2D echocardiogram with negative neurological workup  Recommendations  1.  30 Day Loop monitor 2.  Follow-up with Dr. Ubaldo Glassing as outpatient   Isaias Cowman, MD, PhD, [facility] 10/24/2017 8:06 AM

## 2017-10-24 NOTE — Clinical Social Work Note (Signed)
CSW watched patient as she worked with PT and husband arrived at end of session. Patient was much improved from yesterday and she was able to slowly ambulate to doorway with walker with one rest break. By time she got to her doorway, she was pale, sweaty, and her O2 sats on 3 liters of oxygen had fallen to 83. CSW updated MD. Shela Leff MSW,LCSW [phone]

## 2017-10-24 NOTE — Clinical Social Work Note (Signed)
Clinical Social Work Assessment  Patient Details  Name: Misty Ferguson MRN: [id number] Date of Birth: 1934/05/15  Date of referral:  10/24/17               Reason for consult:  Discharge Planning                Permission sought to share information with:  Chartered certified accountant granted to share information::  Yes, Verbal Permission Granted  Name::        Agency::     Relationship::     Contact Information:     Housing/Transportation Living arrangements for the past 2 months:  Single Family Home Source of Information:  Patient Patient Interpreter Needed:  None Criminal Activity/Legal Involvement Pertinent to Current Situation/Hospitalization:  No - Comment as needed Significant Relationships:  Spouse Lives with:  Spouse Do you feel safe going back to the place where you live?  Yes Need for family participation in patient care:  Yes (Comment)  Care giving concerns:  Patient resides with her spouse.   Social Worker assessment / plan:  CSW was informed by physician that patient could possibly be ready for discharge today. PT is recommending STR. Patient is alert and oriented X4. Previous CSW yesterday documented patient was in agreement and began a bed search. CSW met with patient this morning, introduced self and explained role and purpose of visit. Patient stated that she wanted to let me know that if she had a copay, she would not go to SNF, she would return home with home health. She does agree that a "few" days in a STR facility would benefit her, but is very focused on finances. CSW will contact insurance to see what her benefits might be for STR and will let patient know. Patient was made aware that she has medicare [employer] and that a determination on auth may not be received today so she may incur more hospital charges. Patient is concerned already about being observation status and having to pay for her medications in the hospital.   Employment status:   Retired Nurse, adult PT Recommendations:  [place] / Referral to community resources:     Patient/Family's Response to care:  Patient expressed appreciation for CSW assistance.  Patient/Family's Understanding of and Emotional Response to Diagnosis, Current Treatment, and Prognosis:  Patient is concerned about the financial implications of any of her decisions.  Emotional Assessment Appearance:  Appears younger than stated age Attitude/Demeanor/Rapport:  (pleasant and cooperative) Affect (typically observed):  Accepting Orientation:  Oriented to Self, Oriented to Place, Oriented to  Time, Oriented to Situation Alcohol / Substance use:  Not Applicable Psych involvement (Current and /or in the community):  No (Comment)  Discharge Needs  Concerns to be addressed:  Care Coordination Readmission within the last 30 days:  No Current discharge risk:  None Barriers to Discharge:  No Barriers Identified   Shela Leff, LCSW 10/24/2017, 8:17 AM

## 2017-10-25 ENCOUNTER — Other Ambulatory Visit: Payer: Self-pay

## 2017-10-25 DIAGNOSIS — S76192A Other specified injury of left quadriceps muscle, fascia and tendon, initial encounter: Secondary | ICD-10-CM | POA: Diagnosis not present

## 2017-10-25 DIAGNOSIS — I2510 Atherosclerotic heart disease of native coronary artery without angina pectoris: Secondary | ICD-10-CM | POA: Diagnosis not present

## 2017-10-25 DIAGNOSIS — R55 Syncope and collapse: Secondary | ICD-10-CM | POA: Diagnosis not present

## 2017-10-25 DIAGNOSIS — M6281 Muscle weakness (generalized): Secondary | ICD-10-CM | POA: Diagnosis not present

## 2017-10-25 DIAGNOSIS — E1165 Type 2 diabetes mellitus with hyperglycemia: Secondary | ICD-10-CM | POA: Diagnosis not present

## 2017-10-25 DIAGNOSIS — S76112A Strain of left quadriceps muscle, fascia and tendon, initial encounter: Secondary | ICD-10-CM | POA: Diagnosis not present

## 2017-10-25 DIAGNOSIS — M25559 Pain in unspecified hip: Secondary | ICD-10-CM | POA: Diagnosis not present

## 2017-10-25 DIAGNOSIS — R0789 Other chest pain: Secondary | ICD-10-CM | POA: Diagnosis not present

## 2017-10-25 DIAGNOSIS — G8921 Chronic pain due to trauma: Secondary | ICD-10-CM | POA: Diagnosis not present

## 2017-10-25 DIAGNOSIS — D649 Anemia, unspecified: Secondary | ICD-10-CM | POA: Diagnosis not present

## 2017-10-25 DIAGNOSIS — J449 Chronic obstructive pulmonary disease, unspecified: Secondary | ICD-10-CM | POA: Diagnosis not present

## 2017-10-25 DIAGNOSIS — I10 Essential (primary) hypertension: Secondary | ICD-10-CM | POA: Diagnosis not present

## 2017-10-25 DIAGNOSIS — E569 Vitamin deficiency, unspecified: Secondary | ICD-10-CM | POA: Diagnosis not present

## 2017-10-25 DIAGNOSIS — H409 Unspecified glaucoma: Secondary | ICD-10-CM | POA: Diagnosis not present

## 2017-10-25 DIAGNOSIS — J441 Chronic obstructive pulmonary disease with (acute) exacerbation: Secondary | ICD-10-CM | POA: Diagnosis not present

## 2017-10-25 DIAGNOSIS — Z7401 Bed confinement status: Secondary | ICD-10-CM | POA: Diagnosis not present

## 2017-10-25 DIAGNOSIS — E7849 Other hyperlipidemia: Secondary | ICD-10-CM | POA: Diagnosis not present

## 2017-10-25 DIAGNOSIS — Z9911 Dependence on respirator [ventilator] status: Secondary | ICD-10-CM | POA: Diagnosis not present

## 2017-10-25 DIAGNOSIS — Z5189 Encounter for other specified aftercare: Secondary | ICD-10-CM | POA: Diagnosis not present

## 2017-10-25 DIAGNOSIS — M25552 Pain in left hip: Secondary | ICD-10-CM | POA: Diagnosis not present

## 2017-10-25 DIAGNOSIS — E119 Type 2 diabetes mellitus without complications: Secondary | ICD-10-CM | POA: Diagnosis not present

## 2017-10-25 DIAGNOSIS — R531 Weakness: Secondary | ICD-10-CM | POA: Diagnosis not present

## 2017-10-25 DIAGNOSIS — R262 Difficulty in walking, not elsewhere classified: Secondary | ICD-10-CM | POA: Diagnosis not present

## 2017-10-25 DIAGNOSIS — S032XXA Dislocation of tooth, initial encounter: Secondary | ICD-10-CM | POA: Diagnosis not present

## 2017-10-25 MED ORDER — LOSARTAN POTASSIUM 50 MG PO TABS: 50.0000 mg | ORAL_TABLET | Freq: Every day | ORAL | 3 refills | Status: DC

## 2017-10-25 MED ORDER — HYDROCODONE-ACETAMINOPHEN 5-325 MG PO TABS: 1.0000 | ORAL_TABLET | ORAL | 0 refills | Status: DC | PRN

## 2017-10-25 NOTE — Clinical Social Work Note (Signed)
Auth from insurance is still pending. MD is aware. Patient is aware that if no auth obtained today, she will discharge home with home health. Shela Leff MSW,LCSW [phone]

## 2017-10-25 NOTE — Telephone Encounter (Signed)
Rx sent electronically.  

## 2017-10-25 NOTE — Clinical Social Work Note (Signed)
After battling with insurance company regarding need to obtain a disposition, [employer] finally made decision and has approved for patient to go to [facility]. [facility] received [place]. Discharge summary to be sent. Nurse to call report. Patient to transport via EMS.  Patient is aware and is very happy. Shela Leff MSW,LCSW [phone]

## 2017-10-25 NOTE — Discharge Summary (Signed)
Misty Ferguson at [place] NAME: Misty Ferguson    MR#:  [id number]  DATE OF BIRTH:  February 22, 1934  DATE OF ADMISSION:  10/22/2017 ADMITTING PHYSICIAN: Harrie Foreman, MD  DATE OF DISCHARGE: No discharge date for patient encounter.  PRIMARY CARE PHYSICIAN: [name], MD    ADMISSION DIAGNOSIS:  Fall [W19.XXXA] Left hip pain [M25.552] Tooth avulsion, initial encounter [S03.2XXA] Rupture of left quadriceps muscle, initial encounter [S76.112A] Syncope, unspecified syncope type [R55]  DISCHARGE DIAGNOSIS:  Active Problems:   Syncope   SECONDARY DIAGNOSIS:   Past Medical History:  Diagnosis Date  . Chronic airway obstruction, not elsewhere classified   . Macular degeneration (senile) of retina, unspecified   . Obstructive chronic bronchitis with exacerbation ([place])   . Osteoporosis, unspecified   . Personal history of peptic ulcer disease   . Tobacco use disorder   . Type II or unspecified type diabetes mellitus without mention of complication, not stated as uncontrolled   . Unspecified disorder resulting from impaired renal function   . Unspecified essential hypertension   . Unspecified glaucoma(365.9)     HOSPITAL COURSE:   84 year old female with past medical history of hypertension, diabetes, macular degeneration, osteoporosis, COPD who presents to the hospital after a syncopal episode and complaining of left leg pain.  1. Syncope-patient has had recurrent syncope prior to coming to hospital.  She denied any prodromal symptoms prior to her episode. She had been recently worked up in the past with a 48-hour Holter monitor which was negative. -Her neurologic workup Has been negative while in the hospital. She was Seen by cardiology and they plan on doing a 30 day event monitor as outpatient. - no alarms on tele while in the hospital and had recent Echo which was normal.  -patient had no syncopal episodes while in the hospital and  will follow-up with cardiology as an outpatient.  2. Left leg pain-secondary to left quadriceps muscle tear/contusion from her recent fall. -patient was seen by physical therapy and recommended short-term rehabilitation which is where patient is presently being discharged. She will continue oral Percocet for pain control. If her pain continues or persists she would benefit from a outpatient orthopedic evaluation.  3. History of COPD-no acute exacerbation.  - she will cont. Her albuterol nebs, Stiolto, and albuterol nebs as needed.   4. HTN - she will cont. Losartan  5. Atypical right-sided chest pain-chest x-ray obtained which shows no evidence of rib fractures from recent fall.  Continue supportive care with pain control. Improved.   D/c to SNF today.   DISCHARGE CONDITIONS:   Stable.   CONSULTS OBTAINED:  Treatment Team:  Isaias Cowman, MD  DRUG ALLERGIES:  No Known Allergies  DISCHARGE MEDICATIONS:   Allergies as of 10/25/2017   No Known Allergies     Medication List    STOP taking these medications   azithromycin 250 MG tablet Commonly known as:  ZITHROMAX     TAKE these medications   albuterol 108 (90 Base) MCG/ACT inhaler Commonly known as:  PROVENTIL HFA;VENTOLIN HFA Inhale 2 puffs into the lungs every 6 (six) hours as needed for wheezing or shortness of breath.   albuterol 1.25 MG/3ML nebulizer solution Commonly known as:  ACCUNEB Take 3 mLs (1.25 mg total) by nebulization every 6 (six) hours as needed.   aspirin 81 MG tablet Take 81 mg by mouth at bedtime.   CALCIUM 600/VITAMIN D 600-400 MG-UNIT tablet Generic drug:  Calcium Carbonate-Vitamin  D Take 1 tablet by mouth 2 (two) times daily.   Fish Oil 1000 MG Caps Take 1 capsule by mouth 2 (two) times daily.   HYDROcodone-acetaminophen 5-325 MG tablet Commonly known as:  NORCO/VICODIN Take 1-2 tablets by mouth every 4 (four) hours as needed for moderate pain or severe pain.   losartan 50 MG  tablet Commonly known as:  COZAAR TAKE 1 TABLET EVERY DAY   PRESERVISION AREDS Tabs Take 1 tablet by mouth 2 (two) times daily.   STIOLTO RESPIMAT 2.5-2.5 MCG/ACT Aers Generic drug:  Tiotropium Bromide-Olodaterol INHALE 2 PUFFS INTO THE LUNGS EVERY DAY   VITAMIN C PO Take 1 tablet by mouth daily.         DISCHARGE INSTRUCTIONS:   DIET:  Cardiac diet  DISCHARGE CONDITION:  Stable  ACTIVITY:  Activity as tolerated  OXYGEN:  Home Oxygen: No.   Oxygen Delivery: room air  DISCHARGE LOCATION:  nursing home   If you experience worsening of your admission symptoms, develop shortness of breath, life threatening emergency, suicidal or homicidal thoughts you must seek medical attention immediately by calling 911 or calling your MD immediately  if symptoms less severe.  You Must read complete instructions/literature along with all the possible adverse reactions/side effects for all the Medicines you take and that have been prescribed to you. Take any new Medicines after you have completely understood and accpet all the possible adverse reactions/side effects.   Please note  You were cared for by a hospitalist during your hospital stay. If you have any questions about your discharge medications or the care you received while you were in the hospital after you are discharged, you can call the unit and asked to speak with the hospitalist on call if the hospitalist that took care of you is not available. Once you are discharged, your primary care physician will handle any further medical issues. Please note that NO REFILLS for any discharge medications will be authorized once you are discharged, as it is imperative that you return to your primary care physician (or establish a relationship with a primary care physician if you do not have one) for your aftercare needs so that they can reassess your need for medications and monitor your lab values.     Today   No acute events  overnight, no chest pain. Likely discharge to short-term rehabilitation today.  VITAL SIGNS:  Blood pressure 134/67, pulse 85, temperature 98.1 F (36.7 C), temperature source Oral, resp. rate 20, height 5\' 1"  (1.549 m), weight 52.3 kg (115 lb 4.8 oz), SpO2 99 %.  I/O:    Intake/Output Summary (Last 24 hours) at 10/25/2017 1338 Last data filed at 10/25/2017 0946 Gross per 24 hour  Intake 243 ml  Output 1550 ml  Net -1307 ml    PHYSICAL EXAMINATION:   GENERAL:  84 y.o.-year-old patient lying in bed in no acute distress.  EYES: Pupils equal, round, reactive to light and accommodation. No scleral icterus. Extraocular muscles intact.  HEENT: Head atraumatic, normocephalic. Oropharynx and nasopharynx clear.  NECK:  Supple, no jugular venous distention. No thyroid enlargement, no tenderness.  LUNGS: Normal breath sounds bilaterally, no wheezing, rales, rhonchi. No use of accessory muscles of respiration.  CARDIOVASCULAR: S1, S2 normal. No murmurs, rubs, or gallops.  ABDOMEN: Soft, nontender, nondistended. Bowel sounds present. No organomegaly or mass.  EXTREMITIES: No cyanosis, clubbing, left leg edema 1+ greater than right.    NEUROLOGIC: Cranial nerves II through XII are intact. No focal Motor or sensory  deficits b/l.   PSYCHIATRIC: The patient is alert and oriented x 3.  SKIN: No obvious rash, lesion, or ulcer.     DATA REVIEW:   CBC Recent Labs  Lab 10/22/17 2119  WBC 6.2  HGB 12.5  HCT 37.5  PLT 214    Chemistries  Recent Labs  Lab 10/22/17 2119  NA 139  K 4.2  CL 103  CO2 24  GLUCOSE 109*  BUN 18  CREATININE 0.90  CALCIUM 9.4  AST 29  ALT 21  ALKPHOS 48  BILITOT 0.8    Cardiac Enzymes Recent Labs  Lab 10/22/17 2119  TROPONINI <0.03    Microbiology Results  Results for orders placed or performed during the hospital encounter of 08/18/10  Urine culture     Status: None   Collection Time: 08/18/10  4:26 PM  Result Value Ref Range Status   Specimen  Description URINE, RANDOM  Final   Special Requests CX ADDED ON 08/18/10 @ 1721  Final   Culture  Setup Time [id number]  Final   Colony Count 8,000 COLONIES/ML  Final   Culture INSIGNIFICANT GROWTH  Final   Report Status 08/20/2010 FINAL  Final    RADIOLOGY:  Dg Chest Port 1 View  Result Date: 10/24/2017 CLINICAL DATA:  Chest pain EXAM: PORTABLE CHEST 1 VIEW COMPARISON:  09/08/2017 FINDINGS: Cardiac shadow is within normal limits. Aortic calcifications are again noted. The lungs are well aerated. Very minimal platelike atelectasis is noted in the lateral left lung base. No bony abnormality is seen. IMPRESSION: Minimal left basilar platelike atelectasis Electronically Signed   By: Inez Catalina M.D.   On: 10/24/2017 14:28      Management plans discussed with the patient, family and they are in agreement.  CODE STATUS:     Code Status Orders  (From admission, onward)        Start     Ordered   10/23/17 0551  Do not attempt resuscitation (DNR)  Continuous    Question Answer Comment  In the event of cardiac or respiratory ARREST Do not call a "code blue"   In the event of cardiac or respiratory ARREST Do not perform Intubation, CPR, defibrillation or ACLS   In the event of cardiac or respiratory ARREST Use medication by any route, position, wound care, and other measures to relive pain and suffering. May use oxygen, suction and manual treatment of airway obstruction as needed for comfort.      10/23/17 0550    Code Status History    Date Active Date Inactive Code Status Order ID Comments User Context   10/23/2017 04:35 10/23/2017 05:50 Full Code [id number]  Harrie Foreman, MD Inpatient    Advance Directive Documentation     Most Recent Value  Type of Advance Directive  Out of facility DNR (pink MOST or yellow form)  Pre-existing out of facility DNR order (yellow form or pink MOST form)  No data  "MOST" Form in Place?  No data      TOTAL TIME TAKING CARE OF THIS PATIENT: 40  minutes.    Henreitta Leber M.D on 10/25/2017 at 1:38 PM  Between 7am to 6pm - Pager - [phone]  After 6pm go to [email or web address] - Proofreader  [facility]  Office  [phone]  CC: Primary care physician; [name], MD

## 2017-10-25 NOTE — Plan of Care (Signed)
  Adequate for Discharge Health Behavior/Discharge Planning: Ability to manage health-related needs will improve 10/25/2017 1458 - Adequate for Discharge by Feliberto Gottron, RN Clinical Measurements: Ability to maintain clinical measurements within normal limits will improve 10/25/2017 1458 - Adequate for Discharge by Chriss Czar, Illene Bolus, RN Will remain free from infection 10/25/2017 1458 - Adequate for Discharge by Feliberto Gottron, RN Diagnostic test results will improve 10/25/2017 1458 - Adequate for Discharge by Feliberto Gottron, RN Respiratory complications will improve 10/25/2017 1458 - Adequate for Discharge by Feliberto Gottron, RN Cardiovascular complication will be avoided 10/25/2017 1458 - Adequate for Discharge by Feliberto Gottron, RN Activity: Risk for activity intolerance will decrease 10/25/2017 1458 - Adequate for Discharge by Feliberto Gottron, RN Pain Managment: General experience of comfort will improve 10/25/2017 1458 - Adequate for Discharge by Feliberto Gottron, RN Safety: Ability to remain free from injury will improve 10/25/2017 1458 - Adequate for Discharge by Feliberto Gottron, RN Skin Integrity: Risk for impaired skin integrity will decrease 10/25/2017 1458 - Adequate for Discharge by Feliberto Gottron, RN

## 2017-10-25 NOTE — Progress Notes (Addendum)
Patient given discharge with instructions with husband at bedside. IV taken out and tele monitor off. Patient and husband verbalized understanding with no further questions. Report called and given to [place] at [employer]. EMS called for transport. Vital signs stable.  Update 1545: patient transported via [facility] to [facility], husband took patients belongings.

## 2017-10-25 NOTE — Clinical Social Work Placement (Signed)
   CLINICAL SOCIAL WORK PLACEMENT  NOTE  Date:  10/25/2017  Patient Details  Name: Misty Ferguson MRN: [id number] Date of Birth: 1934-01-13  Clinical Social Work is seeking post-discharge placement for this patient at the [place] level of care (*CSW will initial, date and re-position this form in  chart as items are completed):  Yes   Patient/family provided with [place] Work Department's list of facilities offering this level of care within the geographic area requested by the patient (or if unable, by the patient's family).  Yes   Patient/family informed of their freedom to choose among providers that offer the needed level of care, that participate in Medicare, Medicaid or managed care program needed by the patient, have an available bed and are willing to accept the patient.  Yes   Patient/family informed of [place]'s ownership interest in [facility] and [facility], as well as of the fact that they are under no obligation to receive care at these facilities.  PASRR submitted to EDS on 10/24/17     PASRR number received on 10/24/17     Existing PASRR number confirmed on       FL2 transmitted to all facilities in geographic area requested by pt/family on 10/24/17     FL2 transmitted to all facilities within larger geographic area on       Patient informed that his/her managed care company has contracts with or will negotiate with certain facilities, including the following:        Yes   Patient/family informed of bed offers received.  Patient chooses bed at [facility])     Physician recommends and patient chooses bed at [facility])    Patient to be transferred to ([facility]) on 10/25/17.  Patient to be transferred to facility by (EMS)     Patient family notified on 10/25/17 of transfer.  Name of family member notified:  ([name])     PHYSICIAN       Additional  Comment:    _______________________________________________ Shela Leff, LCSW 10/25/2017, 5:43 PM

## 2017-10-29 DIAGNOSIS — R55 Syncope and collapse: Secondary | ICD-10-CM | POA: Diagnosis not present

## 2017-10-29 DIAGNOSIS — J441 Chronic obstructive pulmonary disease with (acute) exacerbation: Secondary | ICD-10-CM | POA: Diagnosis not present

## 2017-10-29 DIAGNOSIS — S76192A Other specified injury of left quadriceps muscle, fascia and tendon, initial encounter: Secondary | ICD-10-CM | POA: Diagnosis not present

## 2017-10-29 DIAGNOSIS — I10 Essential (primary) hypertension: Secondary | ICD-10-CM | POA: Diagnosis not present

## 2017-11-02 DIAGNOSIS — E119 Type 2 diabetes mellitus without complications: Secondary | ICD-10-CM | POA: Diagnosis not present

## 2017-11-02 DIAGNOSIS — J441 Chronic obstructive pulmonary disease with (acute) exacerbation: Secondary | ICD-10-CM | POA: Diagnosis not present

## 2017-11-02 DIAGNOSIS — M810 Age-related osteoporosis without current pathological fracture: Secondary | ICD-10-CM | POA: Diagnosis not present

## 2017-11-02 DIAGNOSIS — S76112D Strain of left quadriceps muscle, fascia and tendon, subsequent encounter: Secondary | ICD-10-CM | POA: Diagnosis not present

## 2017-11-02 DIAGNOSIS — I2510 Atherosclerotic heart disease of native coronary artery without angina pectoris: Secondary | ICD-10-CM | POA: Diagnosis not present

## 2017-11-02 DIAGNOSIS — G8921 Chronic pain due to trauma: Secondary | ICD-10-CM | POA: Diagnosis not present

## 2017-11-02 DIAGNOSIS — Z9181 History of falling: Secondary | ICD-10-CM | POA: Diagnosis not present

## 2017-11-02 DIAGNOSIS — W19XXXD Unspecified fall, subsequent encounter: Secondary | ICD-10-CM | POA: Diagnosis not present

## 2017-11-02 DIAGNOSIS — I10 Essential (primary) hypertension: Secondary | ICD-10-CM | POA: Diagnosis not present

## 2017-11-02 DIAGNOSIS — J449 Chronic obstructive pulmonary disease, unspecified: Secondary | ICD-10-CM | POA: Diagnosis not present

## 2017-11-02 DIAGNOSIS — H3530 Unspecified macular degeneration: Secondary | ICD-10-CM | POA: Diagnosis not present

## 2017-11-02 DIAGNOSIS — H4010X0 Unspecified open-angle glaucoma, stage unspecified: Secondary | ICD-10-CM | POA: Diagnosis not present

## 2017-11-04 ENCOUNTER — Telehealth: Payer: Self-pay

## 2017-11-04 ENCOUNTER — Telehealth: Payer: Self-pay | Admitting: Family Medicine

## 2017-11-04 DIAGNOSIS — I10 Essential (primary) hypertension: Secondary | ICD-10-CM | POA: Diagnosis not present

## 2017-11-04 DIAGNOSIS — J441 Chronic obstructive pulmonary disease with (acute) exacerbation: Secondary | ICD-10-CM | POA: Diagnosis not present

## 2017-11-04 DIAGNOSIS — H3530 Unspecified macular degeneration: Secondary | ICD-10-CM | POA: Diagnosis not present

## 2017-11-04 DIAGNOSIS — E119 Type 2 diabetes mellitus without complications: Secondary | ICD-10-CM | POA: Diagnosis not present

## 2017-11-04 DIAGNOSIS — H4010X0 Unspecified open-angle glaucoma, stage unspecified: Secondary | ICD-10-CM | POA: Diagnosis not present

## 2017-11-04 DIAGNOSIS — Z9181 History of falling: Secondary | ICD-10-CM | POA: Diagnosis not present

## 2017-11-04 DIAGNOSIS — W19XXXD Unspecified fall, subsequent encounter: Secondary | ICD-10-CM | POA: Diagnosis not present

## 2017-11-04 DIAGNOSIS — M810 Age-related osteoporosis without current pathological fracture: Secondary | ICD-10-CM | POA: Diagnosis not present

## 2017-11-04 DIAGNOSIS — S76112D Strain of left quadriceps muscle, fascia and tendon, subsequent encounter: Secondary | ICD-10-CM | POA: Diagnosis not present

## 2017-11-04 NOTE — Telephone Encounter (Signed)
Copied from [place] [id number]. Topic: Inquiry >> Nov 04, 2017  1:35 PM Scherrie Gerlach wrote: Reason for CRM: Izora Gala with [facility] calling to advise pt declined OT evaluation today

## 2017-11-04 NOTE — Telephone Encounter (Signed)
Copied from [place] [id number]. Topic: General - Other >> Nov 04, 2017 11:53 AM Carolyn Stare wrote:  Butch Penny with [facility] call to ask for plan of care orders to see pt 2 times a week for COPD  COPD RESCUE PROTOCOL IF NEEDED  [phone]

## 2017-11-04 NOTE — Telephone Encounter (Signed)
Verbal orders given to Butch Penny with [facility] to see patient 2 x week for COPD/COPD Rescue Protocol if needed.

## 2017-11-05 ENCOUNTER — Ambulatory Visit: Payer: Medicare HMO | Admitting: Family Medicine

## 2017-11-05 ENCOUNTER — Encounter: Payer: Self-pay | Admitting: *Deleted

## 2017-11-05 ENCOUNTER — Telehealth: Payer: Self-pay | Admitting: Family Medicine

## 2017-11-05 ENCOUNTER — Other Ambulatory Visit: Payer: Self-pay

## 2017-11-05 ENCOUNTER — Other Ambulatory Visit: Payer: Self-pay | Admitting: *Deleted

## 2017-11-05 ENCOUNTER — Encounter: Payer: Self-pay | Admitting: Family Medicine

## 2017-11-05 DIAGNOSIS — S76109A Unspecified injury of unspecified quadriceps muscle, fascia and tendon, initial encounter: Secondary | ICD-10-CM | POA: Diagnosis not present

## 2017-11-05 DIAGNOSIS — R55 Syncope and collapse: Secondary | ICD-10-CM

## 2017-11-05 DIAGNOSIS — J9611 Chronic respiratory failure with hypoxia: Secondary | ICD-10-CM

## 2017-11-05 DIAGNOSIS — J432 Centrilobular emphysema: Secondary | ICD-10-CM | POA: Diagnosis not present

## 2017-11-05 MED ORDER — HYDROCODONE-ACETAMINOPHEN 5-325 MG PO TABS: 1.0000 | ORAL_TABLET | ORAL | 0 refills | Status: DC | PRN

## 2017-11-05 NOTE — Progress Notes (Signed)
Subjective:    Patient ID: Misty Ferguson, female    DOB: 1934-06-04, 84 y.o.   MRN: [id number]  HPI    84 year old female presents for hospital follow up.  Admmited:  10/22/2017 Discharged:10/25/2017 to SNF  discharged from SNF:   Hospital Course summarized/copied below: 11/01/2017   Syncope- She denied any prodromal symptoms prior to her episodes. She had been recently worked up in the past with a 48-hour Holter monitor which was negative. -Her neurologic workup Has been negative while in the hospital. She was Seen by cardiology and they plan on doing a 30 day event monitor as outpatient. - no alarms on tele while in the hospital and had recent Echo which was normal.  -patient had no syncopal episodes while in the hospital and will follow-up with cardiology as an outpatient.  2. Left leg pain-secondary to left quadriceps muscle tear/contusion from her recent fall. -patient was seen by physical therapy and recommended short-term rehabilitation which is where patient is presently being discharged. She will continue oral Percocet for pain control. If her pain continues or persists she would benefit from a outpatient orthopedic evaluation.  3. History of COPD-no acute exacerbation. - she will cont. Her albuterol nebs, Stiolto, and albuterol nebs as needed.   4. HTN - she will cont. Losartan  5. Atypical right-sided chest pain-chest x-ray obtained which shows no evidence of rib fractures from recent fall.  Continue supportive carewith pain control. Improved.     Today: 11/05/17    Since initial episode of syncope  .Marland Kitchen No dizziness, no syncope.  She continues to have fast heart with ambulation and shortness of breath but this is stable for her.  She has HH PT, safety eval.  Sitting she has minimal pain in left leg.. If she gets hit or with PT pain is 7/10 on pain scale. She has had significant improvement in leg pain over time. She has decreased use of hydrocodone 5/325 mg down  to 1 tablet every 4-6 hours.  Using walker. There is prominent edema within the visualized proximal vastus lateralis, vastus intermedius, and vastus medialis muscles, which are slightly atrophic when compared to the right. There is low-grade partial tearing of the right hamstring tendon origin. The visualized gluteus, iliopsoas, and left hamstring tendons appear normal.    Review of Systems  Constitutional: Negative for fatigue and fever.  HENT: Positive for congestion.   Eyes: Negative for pain.  Respiratory: Positive for shortness of breath. Negative for cough.   Cardiovascular: Positive for palpitations. Negative for chest pain and leg swelling.  Gastrointestinal: Negative for abdominal pain.  Genitourinary: Negative for dysuria and vaginal bleeding.  Musculoskeletal: Negative for back pain.  Neurological: Negative for syncope, light-headedness and headaches.  Psychiatric/Behavioral: Negative for dysphoric mood.       Objective:   Physical Exam  Constitutional: Vital signs are normal. She appears well-developed and well-nourished. She is cooperative.  Non-toxic appearance. She does not appear ill. No distress.  HENT:  Head: Normocephalic.  Right Ear: Hearing, tympanic membrane, external ear and ear canal normal. Tympanic membrane is not erythematous, not retracted and not bulging.  Left Ear: Hearing, tympanic membrane, external ear and ear canal normal. Tympanic membrane is not erythematous, not retracted and not bulging.  Nose: No mucosal edema or rhinorrhea. Right sinus exhibits no maxillary sinus tenderness and no frontal sinus tenderness. Left sinus exhibits no maxillary sinus tenderness and no frontal sinus tenderness.  Mouth/Throat: Uvula is midline, oropharynx is clear and  moist and mucous membranes are normal.  Eyes: Pupils are equal, round, and reactive to light. Conjunctivae, EOM and lids are normal. Lids are everted and swept, no foreign bodies found.  Neck: Trachea  normal and normal range of motion. Neck supple. Carotid bruit is not present. No thyroid mass and no thyromegaly present.  Cardiovascular: Normal rate, regular rhythm, S1 normal, S2 normal, normal heart sounds, intact distal pulses and normal pulses. Exam reveals no gallop and no friction rub.  No murmur heard. Pulmonary/Chest: Effort normal and breath sounds normal. No tachypnea. No respiratory distress. She has no decreased breath sounds. She has no wheezes. She has no rhonchi. She has no rales.  Abdominal: Soft. Normal appearance and bowel sounds are normal. There is no tenderness.  Musculoskeletal:  Cannot bend left knee without pain, needs assistance to stand with support.  Very ttp, yells with pain on left quad, especially laterally.  Contusion on thigh  In wheelchair  Neurological: She is alert.  Skin: Skin is warm, dry and intact. No rash noted.  Psychiatric: Her speech is normal and behavior is normal. Judgment and thought content normal. Her mood appears not anxious. Cognition and memory are normal. She does not exhibit a depressed mood.          Assessment & Plan:

## 2017-11-05 NOTE — Telephone Encounter (Signed)
Copied from [place] [id number]. Topic: Quick Communication - See Telephone Encounter >> Nov 05, 2017  1:20 PM Hewitt Shorts wrote: CRM for notification. See Telephone encounter for: 11/05/17.lynn collins with [facility] would like to get verbal orders for PT 2x a week for 4 weeks   Best number for lynn collins is [phone]

## 2017-11-05 NOTE — Patient Outreach (Signed)
[facility] Care Management  11/05/2017  Misty Ferguson Mar 20, 1934 [id number]  Transition of care   Referral date: 11/04/17 Referral source: [employer] member Referral reason : DC from [facility] 3/22, determine transition of care needs.   Chart Reviewed Admitted to [facility] 3/12- 3/15, Dx: Fall, syncope, left hip pain, quadriceps tear. PMHX; includes but not limited to COPD, HTN, Glaucoma.   Successful outreach call to patient , explained purpose of the call , HIPAA verified.  Patient discussed she just returned from PCP visit. Discussed recent stay hospital admission, states she just lost her balance and maybe  blacked out but  so glad to be back at home from rehab.    Social  Patient lives at home with her husband that is able to assist as needed in home and with ADL's. Patient states [facility] physical [facility] has visited, but she declined need for OT services.   Conditions  Patient discussed her history of COPD, she wears oxygen at 3 liters. Patient denies recent need for use of nebulizer, or inhaler. .  Patient discussed condition of Macular degeneration.   Medications No cost concerns, Patient was recently discharged from hospital and all medications have been reviewed.  Appointments  Has attended post discharge  office follow up with PCP   Consent Explained and offered [facility] care management services and patient agrees. Explained transition of care follow up.    Plan  Provided [facility] care management contact number, main office, 24 nurse line and RNCM  Will follow patient for weekly transition of care program, next call in a week and then discuss initial home visit, home safety evaluation .   THN CM Care Plan Problem One     Most Recent Value  Care Plan Problem One  Risk for readmission duet to recent hospitalization related to synope fall,quadricep tear.     Role Documenting the Problem One  Care Management [place] for Problem One   Active  THN Long Term Goal   Patient will experience an hospital admission in the next 31 days   THN Long Term Goal Start Date  11/06/17  Interventions for Problem One Long Term Goal  RNCM advised patient regarding taking medications as prescribed, following discharge instruction   THN CM Short Term Goal #1   Patient will report improved pain control in the next 14 days   THN CM Short Term Goal #1 Start Date  11/06/17  Interventions for Short Term Goal #1  RN discussed with patient pain  control measures, rest , use of prn medications and notifying MD of worsening pain with activity.     THN CM Short Term Goal #2   Patient will report having no falls in the next 30 days   THN CM Short Term Goal #2 Start Date  11/06/17  Interventions for Short Term Goal #2  RNCM advised patient regarding always using her walker, for fall prevention and participation in [place] therapy sessions .       Joylene Draft, RN, [place] Management Coordinator  [phone]- Mobile [phone]- Toll Free Main Office

## 2017-11-05 NOTE — Telephone Encounter (Signed)
Left message for Misty Ferguson with [facility] giving verbal orders for PT 2x a week for 4 weeks.

## 2017-11-05 NOTE — Patient Instructions (Addendum)
Use hydrocodone APAP every 4-6 hours for pain. Can use tylenol for breakthrough pain. Make sure not using more than 4000 mg each day including what is in the hydrocodone/APAP.  Continue PT as planned.

## 2017-11-06 ENCOUNTER — Telehealth: Payer: Self-pay | Admitting: Family Medicine

## 2017-11-06 NOTE — Telephone Encounter (Signed)
Copied from [place]. Topic: Quick Communication - See Telephone Encounter >> Nov 06, 2017 11:28 AM Burnis Medin, NT wrote: CRM for notification. See Telephone encounter for: 11/06/17. Rip Harbour is calling to let the doctor know that the patient is refusing nurse visits. Patient said she does not need anymore nurse visits. Also she asked if there are any changes with patient or medication and would like a call back if there are changes.

## 2017-11-08 DIAGNOSIS — S76112D Strain of left quadriceps muscle, fascia and tendon, subsequent encounter: Secondary | ICD-10-CM | POA: Diagnosis not present

## 2017-11-08 DIAGNOSIS — H3530 Unspecified macular degeneration: Secondary | ICD-10-CM | POA: Diagnosis not present

## 2017-11-08 DIAGNOSIS — H4010X0 Unspecified open-angle glaucoma, stage unspecified: Secondary | ICD-10-CM | POA: Diagnosis not present

## 2017-11-08 DIAGNOSIS — W19XXXD Unspecified fall, subsequent encounter: Secondary | ICD-10-CM | POA: Diagnosis not present

## 2017-11-08 DIAGNOSIS — Z9181 History of falling: Secondary | ICD-10-CM | POA: Diagnosis not present

## 2017-11-08 DIAGNOSIS — E119 Type 2 diabetes mellitus without complications: Secondary | ICD-10-CM | POA: Diagnosis not present

## 2017-11-08 DIAGNOSIS — M810 Age-related osteoporosis without current pathological fracture: Secondary | ICD-10-CM | POA: Diagnosis not present

## 2017-11-08 DIAGNOSIS — I10 Essential (primary) hypertension: Secondary | ICD-10-CM | POA: Diagnosis not present

## 2017-11-08 DIAGNOSIS — J441 Chronic obstructive pulmonary disease with (acute) exacerbation: Secondary | ICD-10-CM | POA: Diagnosis not present

## 2017-11-08 NOTE — Telephone Encounter (Signed)
Melinda notified as instructed by telephone.

## 2017-11-08 NOTE — Telephone Encounter (Signed)
Okay to stop nurse visits.. Make sure to continue PT.  No new changes.

## 2017-11-12 ENCOUNTER — Other Ambulatory Visit: Payer: Self-pay | Admitting: *Deleted

## 2017-11-12 NOTE — Patient Outreach (Signed)
[facility]) Care Management  11/12/2017  VALISHA HESLIN 1934/01/13 [id number]   Transition of care call   Referral date: 11/04/17 Referral source: [employer] member Referral reason : DC from [facility] 3/22, determine transition of care needs.   Chart Reviewed Admitted to [facility] 3/12- 3/15, Dx: Fall, syncope, left hip pain, quadriceps tear. PMHX; includes but not limited to COPD, HTN, Glaucoma.    Placed call to patient, her husband, Randall Hiss answered phone, explained purpose of the call, HIPAA verified, he reports patient is currently in the dentist chair, and states it is best to return call in the next day.   Plan Will plan return weekly transition of care call to patient in the next day.    Joylene Draft, RN, [place] Management Coordinator  [phone]- Mobile [phone]- Toll Free Main Office

## 2017-11-13 ENCOUNTER — Other Ambulatory Visit: Payer: Self-pay | Admitting: *Deleted

## 2017-11-13 DIAGNOSIS — Z9181 History of falling: Secondary | ICD-10-CM | POA: Diagnosis not present

## 2017-11-13 DIAGNOSIS — I10 Essential (primary) hypertension: Secondary | ICD-10-CM | POA: Diagnosis not present

## 2017-11-13 DIAGNOSIS — E119 Type 2 diabetes mellitus without complications: Secondary | ICD-10-CM | POA: Diagnosis not present

## 2017-11-13 DIAGNOSIS — H3530 Unspecified macular degeneration: Secondary | ICD-10-CM | POA: Diagnosis not present

## 2017-11-13 DIAGNOSIS — H4010X0 Unspecified open-angle glaucoma, stage unspecified: Secondary | ICD-10-CM | POA: Diagnosis not present

## 2017-11-13 DIAGNOSIS — S76112D Strain of left quadriceps muscle, fascia and tendon, subsequent encounter: Secondary | ICD-10-CM | POA: Diagnosis not present

## 2017-11-13 DIAGNOSIS — M810 Age-related osteoporosis without current pathological fracture: Secondary | ICD-10-CM | POA: Diagnosis not present

## 2017-11-13 DIAGNOSIS — J441 Chronic obstructive pulmonary disease with (acute) exacerbation: Secondary | ICD-10-CM | POA: Diagnosis not present

## 2017-11-13 DIAGNOSIS — W19XXXD Unspecified fall, subsequent encounter: Secondary | ICD-10-CM | POA: Diagnosis not present

## 2017-11-13 NOTE — Patient Outreach (Signed)
[facility]) Care Management  11/13/2017  JAMILYN PIGEON [birth year] [id number]   Transition of care call   Successful outreach call to patient , HIPAA information verified. Patient states she believes that she is getting along well with her home physical therapy. Patient denies any symptoms of dizziness.  Patient discussed going to Dentist appointment on yesterday, to get repair to broken tooth that occurred when she had the fall.   Patient denies any new concerns at this time.  Discussed scheduling home visit at next call patient is agreeable.   Plan Will continue weekly transition of care outreach calls, next call in a week with scheduling home visit.    Joylene Draft, RN, [place] Management Coordinator  [phone]- Mobile [phone]- Toll Free Main Office

## 2017-11-14 DIAGNOSIS — S76112D Strain of left quadriceps muscle, fascia and tendon, subsequent encounter: Secondary | ICD-10-CM | POA: Diagnosis not present

## 2017-11-14 DIAGNOSIS — J441 Chronic obstructive pulmonary disease with (acute) exacerbation: Secondary | ICD-10-CM | POA: Diagnosis not present

## 2017-11-14 DIAGNOSIS — H3530 Unspecified macular degeneration: Secondary | ICD-10-CM | POA: Diagnosis not present

## 2017-11-14 DIAGNOSIS — H4010X0 Unspecified open-angle glaucoma, stage unspecified: Secondary | ICD-10-CM | POA: Diagnosis not present

## 2017-11-15 DIAGNOSIS — M810 Age-related osteoporosis without current pathological fracture: Secondary | ICD-10-CM | POA: Diagnosis not present

## 2017-11-15 DIAGNOSIS — W19XXXD Unspecified fall, subsequent encounter: Secondary | ICD-10-CM | POA: Diagnosis not present

## 2017-11-15 DIAGNOSIS — H3530 Unspecified macular degeneration: Secondary | ICD-10-CM | POA: Diagnosis not present

## 2017-11-15 DIAGNOSIS — H4010X0 Unspecified open-angle glaucoma, stage unspecified: Secondary | ICD-10-CM | POA: Diagnosis not present

## 2017-11-15 DIAGNOSIS — Z9181 History of falling: Secondary | ICD-10-CM | POA: Diagnosis not present

## 2017-11-15 DIAGNOSIS — I10 Essential (primary) hypertension: Secondary | ICD-10-CM | POA: Diagnosis not present

## 2017-11-15 DIAGNOSIS — S76112D Strain of left quadriceps muscle, fascia and tendon, subsequent encounter: Secondary | ICD-10-CM | POA: Diagnosis not present

## 2017-11-15 DIAGNOSIS — E119 Type 2 diabetes mellitus without complications: Secondary | ICD-10-CM | POA: Diagnosis not present

## 2017-11-15 DIAGNOSIS — J441 Chronic obstructive pulmonary disease with (acute) exacerbation: Secondary | ICD-10-CM | POA: Diagnosis not present

## 2017-11-18 DIAGNOSIS — S76112D Strain of left quadriceps muscle, fascia and tendon, subsequent encounter: Secondary | ICD-10-CM | POA: Diagnosis not present

## 2017-11-18 DIAGNOSIS — H4010X0 Unspecified open-angle glaucoma, stage unspecified: Secondary | ICD-10-CM | POA: Diagnosis not present

## 2017-11-18 DIAGNOSIS — M810 Age-related osteoporosis without current pathological fracture: Secondary | ICD-10-CM | POA: Diagnosis not present

## 2017-11-18 DIAGNOSIS — W19XXXD Unspecified fall, subsequent encounter: Secondary | ICD-10-CM | POA: Diagnosis not present

## 2017-11-18 DIAGNOSIS — Z9181 History of falling: Secondary | ICD-10-CM | POA: Diagnosis not present

## 2017-11-18 DIAGNOSIS — I10 Essential (primary) hypertension: Secondary | ICD-10-CM | POA: Diagnosis not present

## 2017-11-18 DIAGNOSIS — J441 Chronic obstructive pulmonary disease with (acute) exacerbation: Secondary | ICD-10-CM | POA: Diagnosis not present

## 2017-11-18 DIAGNOSIS — H3530 Unspecified macular degeneration: Secondary | ICD-10-CM | POA: Diagnosis not present

## 2017-11-18 DIAGNOSIS — E119 Type 2 diabetes mellitus without complications: Secondary | ICD-10-CM | POA: Diagnosis not present

## 2017-11-19 ENCOUNTER — Encounter: Payer: Self-pay | Admitting: Family Medicine

## 2017-11-19 ENCOUNTER — Ambulatory Visit (INDEPENDENT_AMBULATORY_CARE_PROVIDER_SITE_OTHER): Payer: Medicare HMO | Admitting: Family Medicine

## 2017-11-19 ENCOUNTER — Ambulatory Visit: Payer: Medicare HMO | Admitting: Family Medicine

## 2017-11-19 VITALS — BP 140/72 | HR 97 | Temp 97.2°F | Ht 61.0 in | Wt 110.0 lb

## 2017-11-19 DIAGNOSIS — R55 Syncope and collapse: Secondary | ICD-10-CM | POA: Diagnosis not present

## 2017-11-19 DIAGNOSIS — J9611 Chronic respiratory failure with hypoxia: Secondary | ICD-10-CM

## 2017-11-19 DIAGNOSIS — J432 Centrilobular emphysema: Secondary | ICD-10-CM

## 2017-11-19 DIAGNOSIS — S76109A Unspecified injury of unspecified quadriceps muscle, fascia and tendon, initial encounter: Secondary | ICD-10-CM | POA: Insufficient documentation

## 2017-11-19 NOTE — Assessment & Plan Note (Signed)
[place] on 2.5 L oxygen

## 2017-11-19 NOTE — Patient Instructions (Signed)
Keep up the great work with physical therapy.  Call if any further passing put spells or any irregular heart rate.

## 2017-11-19 NOTE — Assessment & Plan Note (Signed)
No further episode and no clear neuro or cardiac source.  Likely multifactorial from chronic hypoxia and possible orthostasis.. Now resolved.  Pt does not wish to proceed with further cardiology eval with 30 day monitor, and I think this is reasonable at this time.

## 2017-11-19 NOTE — Assessment & Plan Note (Signed)
Continue inhaler and chronic antibitoics.  Likely in part related to tachy and syncope.

## 2017-11-19 NOTE — Assessment & Plan Note (Signed)
Stable on current regimen   

## 2017-11-19 NOTE — Assessment & Plan Note (Signed)
Improving significantly. Able to wean off narcotic and only using tylenol for pain.  Continued PT.

## 2017-11-19 NOTE — Assessment & Plan Note (Signed)
No further episodes. Neg neuro eval in hospital and pout pt initial cardiology work up neg. Pt refuses further cardiology workup such as 30 day monitor at this time.

## 2017-11-19 NOTE — Assessment & Plan Note (Signed)
Stabel O2 on oxygen

## 2017-11-19 NOTE — Assessment & Plan Note (Signed)
Severe pain to palpation and limited motion. Continue hydrocodone.. Refilled to use as needed. Wean as able.  Continue PT, restrictions given.  no clear need for ortho referral.

## 2017-11-19 NOTE — Progress Notes (Signed)
Subjective:    Patient ID: Misty Ferguson, female    DOB: 01-17-1934, 84 y.o.   MRN: [id number]  HPI   84 year old female presents for 2 week follow up on left  Leg pain secondary to left quadriceps muscle tear/contusion.   At last OV Misty Ferguson was using hydrocodone every 4-6 hours for pain.  Currently in PT for rehab.   During hospitalization.. No clear source of syncope found. Neg neuro work up. No alarms on telewhile in the hospitaland had recent Echo and 48 hour holter monitor which was normal per outpatient cards. Patient had no syncopal episodes while in the hospital   Misty Ferguson reports today Misty Ferguson has  Had improvement in her pain.  Misty Ferguson is using hydrocodone rarely for pain ( has not used in last 3 days).. Mainly controlled with tylenol. ( Has 23 tabs left)  Misty Ferguson is  Twice a week PT.. Going well.   Doing PT on own as well. Using walker.  No further syncopal spell and  no change in SOB or HR.  No chest pain.  Blood pressure 140/72, pulse 97, temperature (!) 97.2 F (36.2 C), temperature source Oral, height 5\' 1"  (1.549 m), weight 110 lb (49.9 kg), SpO2 92 %. On 2.5 L .   Review of Systems  Constitutional: Negative for fatigue and fever.  HENT: Negative for congestion.   Eyes: Negative for pain.  Respiratory: Negative for cough and shortness of breath.   Cardiovascular: Negative for chest pain, palpitations and leg swelling.  Gastrointestinal: Negative for abdominal pain.  Genitourinary: Negative for dysuria and vaginal bleeding.  Musculoskeletal: Negative for back pain.  Neurological: Negative for syncope, light-headedness and headaches.  Psychiatric/Behavioral: Negative for dysphoric mood.       Objective:   Physical Exam  Constitutional: Vital signs are normal. Misty Ferguson appears well-developed and well-nourished. Misty Ferguson is cooperative.  Non-toxic appearance. Misty Ferguson does not appear ill. No distress.  HENT:  Head: Normocephalic.  Right Ear: Hearing, tympanic membrane, external ear and  ear canal normal. Tympanic membrane is not erythematous, not retracted and not bulging.  Left Ear: Hearing, tympanic membrane, external ear and ear canal normal. Tympanic membrane is not erythematous, not retracted and not bulging.  Nose: No mucosal edema or rhinorrhea. Right sinus exhibits no maxillary sinus tenderness and no frontal sinus tenderness. Left sinus exhibits no maxillary sinus tenderness and no frontal sinus tenderness.  Mouth/Throat: Uvula is midline, oropharynx is clear and moist and mucous membranes are normal.  Eyes: Pupils are equal, round, and reactive to light. Conjunctivae, EOM and lids are normal. Lids are everted and swept, no foreign bodies found.  Neck: Trachea normal and normal range of motion. Neck supple. Carotid bruit is not present. No thyroid mass and no thyromegaly present.  Cardiovascular: Normal rate, regular rhythm, S1 normal, S2 normal, normal heart sounds, intact distal pulses and normal pulses. Exam reveals no gallop and no friction rub.  No murmur heard. Pulmonary/Chest: Effort normal. No tachypnea. No respiratory distress. Misty Ferguson has decreased breath sounds. Misty Ferguson has no wheezes. Misty Ferguson has no rhonchi. Misty Ferguson has no rales.   stable breath sounds  Abdominal: Soft. Normal appearance and bowel sounds are normal. There is no tenderness.  Musculoskeletal:   Now able to ambulate with walker, decreased pain with ambulation,  Now able to bend knee 90degrees, still ttp over quad especially over lateral quad/insertion  Neurological: Misty Ferguson is alert.  Skin: Skin is warm, dry and intact. No rash noted.  Psychiatric: Her speech is normal  and behavior is normal. Judgment and thought content normal. Her mood appears not anxious. Cognition and memory are normal. Misty Ferguson does not exhibit a depressed mood.          Assessment & Plan:

## 2017-11-20 ENCOUNTER — Other Ambulatory Visit: Payer: Self-pay | Admitting: *Deleted

## 2017-11-20 DIAGNOSIS — S76112D Strain of left quadriceps muscle, fascia and tendon, subsequent encounter: Secondary | ICD-10-CM | POA: Diagnosis not present

## 2017-11-20 DIAGNOSIS — W19XXXD Unspecified fall, subsequent encounter: Secondary | ICD-10-CM | POA: Diagnosis not present

## 2017-11-20 DIAGNOSIS — I10 Essential (primary) hypertension: Secondary | ICD-10-CM | POA: Diagnosis not present

## 2017-11-20 DIAGNOSIS — E119 Type 2 diabetes mellitus without complications: Secondary | ICD-10-CM | POA: Diagnosis not present

## 2017-11-20 DIAGNOSIS — Z9181 History of falling: Secondary | ICD-10-CM | POA: Diagnosis not present

## 2017-11-20 DIAGNOSIS — M810 Age-related osteoporosis without current pathological fracture: Secondary | ICD-10-CM | POA: Diagnosis not present

## 2017-11-20 DIAGNOSIS — J441 Chronic obstructive pulmonary disease with (acute) exacerbation: Secondary | ICD-10-CM | POA: Diagnosis not present

## 2017-11-20 DIAGNOSIS — H4010X0 Unspecified open-angle glaucoma, stage unspecified: Secondary | ICD-10-CM | POA: Diagnosis not present

## 2017-11-20 DIAGNOSIS — H3530 Unspecified macular degeneration: Secondary | ICD-10-CM | POA: Diagnosis not present

## 2017-11-20 NOTE — Patient Outreach (Signed)
[facility]) Care Management  11/20/2017  Misty Ferguson 07/11/1934 [id number]   Transition of care call   Successful outreach call to patient, HIPAA verified.  Patient discussed she making good progress, coming from not being able to do anything on her own,now she is able to dress self , get out of bed, still just needs help with showering.   Patient reports having home health physical therapy at least twice weekly. Patient denies increase in shortness of breath or need for use of rescue inhaler.   Patient denies any new concerns at this time, patient is agreeable to home visit in the next week.   Plan  Will plan initial transition of care home visit in the next week.    Joylene Draft, RN, [place] Management Coordinator  [phone]- Mobile [phone]- Toll Free Main Office

## 2017-11-27 DIAGNOSIS — I10 Essential (primary) hypertension: Secondary | ICD-10-CM | POA: Diagnosis not present

## 2017-11-27 DIAGNOSIS — H4010X0 Unspecified open-angle glaucoma, stage unspecified: Secondary | ICD-10-CM | POA: Diagnosis not present

## 2017-11-27 DIAGNOSIS — E119 Type 2 diabetes mellitus without complications: Secondary | ICD-10-CM | POA: Diagnosis not present

## 2017-11-27 DIAGNOSIS — M810 Age-related osteoporosis without current pathological fracture: Secondary | ICD-10-CM | POA: Diagnosis not present

## 2017-11-27 DIAGNOSIS — H3530 Unspecified macular degeneration: Secondary | ICD-10-CM | POA: Diagnosis not present

## 2017-11-27 DIAGNOSIS — W19XXXD Unspecified fall, subsequent encounter: Secondary | ICD-10-CM | POA: Diagnosis not present

## 2017-11-27 DIAGNOSIS — J441 Chronic obstructive pulmonary disease with (acute) exacerbation: Secondary | ICD-10-CM | POA: Diagnosis not present

## 2017-11-27 DIAGNOSIS — Z9181 History of falling: Secondary | ICD-10-CM | POA: Diagnosis not present

## 2017-11-27 DIAGNOSIS — S76112D Strain of left quadriceps muscle, fascia and tendon, subsequent encounter: Secondary | ICD-10-CM | POA: Diagnosis not present

## 2017-11-28 ENCOUNTER — Encounter: Payer: Self-pay | Admitting: *Deleted

## 2017-11-28 ENCOUNTER — Other Ambulatory Visit: Payer: Self-pay | Admitting: *Deleted

## 2017-11-28 NOTE — Patient Outreach (Signed)
[facility]) Care Management   11/28/2017  Misty Ferguson 05-10-1934 [id number]  Misty Ferguson is an 84 y.o. female   Referral date: 11/04/17 Referral source: [employer] member Referral reason : DC from [facility] 3/22, determine transition of care needs.   Chart Reviewed Admitted to [facility] 3/12- 3/15, Dx: Fall, syncope, left hip pain, quadriceps tear. PMHX; includes but not limited to COPD, HTN, Glaucoma.    Subjective:  Patient discussed she is getting stronger and has graduated from home health therapy.    Objective:  BP 128/80 (BP Location: Left Arm, Patient Position: Sitting, Cuff Size: Normal)   Pulse 98   Resp 18   Ht 1.549 m ('5\' 1"' )   Wt 108 lb (49 kg)   SpO2 96%   BMI 20.41 kg/m  Review of Systems  Constitutional: Negative.   HENT: Negative.   Eyes: Negative.   Respiratory: Negative.   Cardiovascular: Negative.   Gastrointestinal: Negative.   Genitourinary: Negative.   Musculoskeletal: Positive for joint pain.  Skin: Negative.   Neurological: Negative.   Endo/Heme/Allergies: Bruises/bleeds easily.       Bruise easily, arms, legs hands , discolored  Psychiatric/Behavioral: Negative.     Physical Exam  Constitutional: She is oriented to person, place, and time. She appears well-developed and well-nourished.  Cardiovascular: Normal rate and normal heart sounds.  Respiratory: Effort normal and breath sounds normal.  GI: Soft. Bowel sounds are normal.  Neurological: She is alert and oriented to person, place, and time.  Skin: Skin is warm and dry.  Psychiatric: She has a normal mood and affect. Her behavior is normal. Judgment and thought content normal.    Encounter Medications:   Outpatient Encounter Medications as of 11/28/2017  Medication Sig  . albuterol (ACCUNEB) 1.25 MG/3ML nebulizer solution Take 3 mLs (1.25 mg total) by nebulization every 6 (six) hours as needed.  Marland Kitchen albuterol (PROVENTIL HFA;VENTOLIN HFA) 108 (90 Base) MCG/ACT  inhaler Inhale 2 puffs into the lungs every 6 (six) hours as needed for wheezing or shortness of breath.  . Ascorbic Acid (VITAMIN C PO) Take 1 tablet by mouth daily.  Marland Kitchen aspirin 81 MG tablet Take 81 mg by mouth at bedtime.   Marland Kitchen losartan (COZAAR) 50 MG tablet Take 1 tablet (50 mg total) by mouth daily.  . Multiple Vitamins-Minerals (PRESERVISION AREDS) TABS Take 1 tablet by mouth 2 (two) times daily.  . Omega-3 Fatty Acids (FISH OIL) 1000 MG CAPS Take 1 capsule by mouth 2 (two) times daily.   Marland Kitchen STIOLTO RESPIMAT 2.5-2.5 MCG/ACT AERS INHALE 2 PUFFS INTO THE LUNGS EVERY DAY  . HYDROcodone-acetaminophen (NORCO/VICODIN) 5-325 MG tablet Take 1 tablet by mouth every 4 (four) hours as needed for moderate pain or severe pain. (Patient not taking: Reported on 11/28/2017)   No facility-administered encounter medications on file as of 11/28/2017.     Functional Status:   In your present state of health, do you have any difficulty performing the following activities: 11/13/2017 10/23/2017  Hearing? N N  Vision? N N  Comment - -  Difficulty concentrating or making decisions? N N  Walking or climbing stairs? Y Y  Comment recent fall, followed by HHPT -  Dressing or bathing? N N  Comment husband helps if needed -  Doing errands, shopping? Y Y  Comment husband assist  -  Conservation officer, nature and eating ? Y -  Comment husband helps -  Using the Toilet? N -  In the past six months, have you  accidently leaked urine? N -  Do you have problems with loss of bowel control? N -  Managing your Medications? N -  Managing your Finances? N -  Housekeeping or managing your Housekeeping? Y -  Comment husband helps -  Some recent data might be hidden    Fall/Depression Screening:    Fall Risk  11/28/2017 11/05/2017 10/11/2017  Falls in the past year? Yes Yes No  Comment - - -  Number falls in past yr: 1 1 -  Injury with Fall? Yes Yes -  Risk Factor Category  High Fall Risk High Fall Risk -  Risk for fall due to : History  of fall(s) History of fall(s);Impaired balance/gait -  Follow up Falls evaluation completed;Education provided Falls prevention discussed -   PHQ 2/9 Scores 11/05/2017 10/11/2017 10/04/2016 09/22/2015 09/21/2014 09/18/2013  PHQ - 2 Score 1 0 0 0 0 0  PHQ- 9 Score - 0 - - - -    Assessment:  Initial home visit , patient husband   Recent syncope - patient denies recent episode or symptoms   Left leg pain/Quadricept tear - improving mobility, using cane during the day and walker at night. Pain improved not requiring narcotic, occasional tylenol  History of COPD- usual state of breathing, continuing daily stiolto and not requiring prn albuterol in the last 3 weeks. Continues wearing oxygen at 3 liters.   Plan:  Welcome packet provided and reviewed.  Will plan final transition of care call in the next week.  Provided [facility] calendar and review of COPD zones.  Will send PCP visit note.    THN CM Care Plan Problem One     Most Recent Value  Care Plan Problem One  Risk for readmission duet to recent hospitalization related to synope fall,quadricep tear.     Role Documenting the Problem One  Care Management [place] for Problem One  Active  THN Long Term Goal   Patient will experience an hospital admission in the next 31 days   THN Long Term Goal Start Date  11/06/17  Interventions for Problem One Long Term Goal  Again reviewed with patient to notify MD of any new symptoms of dizzines, weakness, shortness of breath. or pain   THN CM Short Term Goal #1   Patient will report improved pain control in the next 14 days   THN CM Short Term Goal #1 Start Date  11/06/17  [facility] CM Short Term Goal #1 Met Date  11/28/17  THN CM Short Term Goal #2   Patient will report having no falls in the next 30 days   THN CM Short Term Goal #2 Start Date  11/06/17  Interventions for Short Term Goal #2  Reviewed again fall precautions and handout in [facility] welcome book, reinforced standing at least 15 seconds when  initally rising  from sitting or lying position before starting to walk     [facility] CM Short Term Goal #3  Patient will report continuing daily Phyiscal therapy exercise at least once daily for 5 days a weeks,   THN CM Short Term Goal #3 Start Date  11/28/17  Interventions for Short Tern Goal #3  Discussed with patient exercise plan outlined by home health physical therapy and discussed benefit of continued exercies to improve strenght and balance.       Joylene Draft, RN, [place] Management Coordinator  [phone]- Mobile [phone]- Toll Free Main Office

## 2017-11-29 DIAGNOSIS — W19XXXD Unspecified fall, subsequent encounter: Secondary | ICD-10-CM | POA: Diagnosis not present

## 2017-11-29 DIAGNOSIS — H4010X0 Unspecified open-angle glaucoma, stage unspecified: Secondary | ICD-10-CM | POA: Diagnosis not present

## 2017-11-29 DIAGNOSIS — I10 Essential (primary) hypertension: Secondary | ICD-10-CM | POA: Diagnosis not present

## 2017-11-29 DIAGNOSIS — J441 Chronic obstructive pulmonary disease with (acute) exacerbation: Secondary | ICD-10-CM | POA: Diagnosis not present

## 2017-11-29 DIAGNOSIS — M810 Age-related osteoporosis without current pathological fracture: Secondary | ICD-10-CM | POA: Diagnosis not present

## 2017-11-29 DIAGNOSIS — E119 Type 2 diabetes mellitus without complications: Secondary | ICD-10-CM | POA: Diagnosis not present

## 2017-11-29 DIAGNOSIS — Z9181 History of falling: Secondary | ICD-10-CM | POA: Diagnosis not present

## 2017-11-29 DIAGNOSIS — S76112D Strain of left quadriceps muscle, fascia and tendon, subsequent encounter: Secondary | ICD-10-CM | POA: Diagnosis not present

## 2017-11-29 DIAGNOSIS — H3530 Unspecified macular degeneration: Secondary | ICD-10-CM | POA: Diagnosis not present

## 2017-12-06 ENCOUNTER — Other Ambulatory Visit: Payer: Self-pay | Admitting: *Deleted

## 2017-12-06 NOTE — Patient Outreach (Signed)
[place] [facility]) Care Management  12/06/2017  KRYSTYN PICKING 1934-02-18 [id number]   Final transition of care call  Placed call to patient, HIPAA verified. Patient discussed she continues to do well, getting stronger.  Patient reports she has graduated for home physical therapy on last week, and continues to do daily exercises.  Patient reports her breathing is at usual state. Patient reports being able to get back to some of her usual activities, grocery shopping, attending class at college and prayer service and this has been very encouraging for her.   Patient denies any new concerns at this time.  Patient has completed transition of care program, will continue to follow in complex care management program.   Plan Will plan follow up call in the next 2 weeks.    Joylene Draft, RN, [place] Management Coordinator  [phone]- Mobile [phone]- Toll Free Main Office

## 2017-12-10 ENCOUNTER — Other Ambulatory Visit: Payer: Self-pay | Admitting: Internal Medicine

## 2017-12-20 ENCOUNTER — Other Ambulatory Visit: Payer: Self-pay | Admitting: *Deleted

## 2017-12-20 NOTE — Patient Outreach (Signed)
[place] [facility]) Care Management  12/20/2017  Misty Ferguson 12-Sep-1933 [id number]  Telephone follow up   Successful call to patient, HIPAA verified. Patient reports she continues to improve day by day.  Patient reports tolerating increasing activity, she denies any dizziness episodes. Patient reports her breathing is in usual state, green zone today.   Patient denies any new concerns .    Plan Will plan final call in the next 3 weeks for case closure if no new concerns.   Joylene Draft, RN, [place] Management Coordinator  [phone]- Mobile [phone]- Toll Free Main Office

## 2018-01-03 DIAGNOSIS — H353131 Nonexudative age-related macular degeneration, bilateral, early dry stage: Secondary | ICD-10-CM | POA: Diagnosis not present

## 2018-01-07 ENCOUNTER — Encounter: Payer: Self-pay | Admitting: *Deleted

## 2018-01-07 ENCOUNTER — Other Ambulatory Visit: Payer: Self-pay | Admitting: *Deleted

## 2018-01-07 NOTE — Patient Outreach (Signed)
[place] [facility]) Care Management  01/07/2018  Misty Ferguson [birth year] [id number]   Case Closure call   Referral date: 11/04/17 Referral source: [employer] member Referral reason : DC from [facility] 3/22, determine transition of care needs.   Chart Reviewed Admitted to [facility] 3/12- 3/15, Dx: Fall, syncope, left hip pain, quadriceps tear. PMHX; includes but not limited to COPD, HTN, Glaucoma.    Successful outreach call to patient , HIPAA verified. Patient reports that she is getting along great making continued progress.  Patient discussed that she continues to complete daily exercises, including walking on the treadmill, pain controlled. Patient discussed tolerating getting back into some social outing that she used to attend.  Patient reports her COPD symptoms are in the green usual her usual state, and she continues daily with her routine medications.  Patient denies any new concerns, case closure discussed goals met patient in agreement.  Verified patient has [facility] contact information for future needs'  Plan Will close case, goal have been met Will send PCP case closure letter.    Joylene Draft, RN, [place] Management Coordinator  [phone]- Mobile [phone]- Toll Free Main Office

## 2018-03-04 ENCOUNTER — Encounter: Payer: Self-pay | Admitting: Internal Medicine

## 2018-03-04 ENCOUNTER — Ambulatory Visit: Payer: Medicare HMO | Admitting: Internal Medicine

## 2018-03-04 VITALS — BP 140/88 | HR 97 | Ht 61.0 in | Wt 109.0 lb

## 2018-03-04 DIAGNOSIS — J9611 Chronic respiratory failure with hypoxia: Secondary | ICD-10-CM | POA: Diagnosis not present

## 2018-03-04 DIAGNOSIS — J449 Chronic obstructive pulmonary disease, unspecified: Secondary | ICD-10-CM | POA: Diagnosis not present

## 2018-03-04 MED ORDER — ALBUTEROL SULFATE (2.5 MG/3ML) 0.083% IN NEBU: 2.5000 mg | INHALATION_SOLUTION | Freq: Four times a day (QID) | RESPIRATORY_TRACT | 12 refills | Status: DC | PRN

## 2018-03-04 NOTE — Progress Notes (Signed)
[place] [facility] Consultation     Date: 03/04/2018,   MRN# [id number] Misty Ferguson 01-Jun-1934    AdmissionWeight: 109 lb (49.4 kg)                 CurrentWeight: 109 lb (49.4 kg)  Synopsis: Misty Ferguson was first seen by the [facility] [facility] pulmonary clinic in the summer of 2013. She has COPD. Simple spirometry performed in clinic showed an FEV1 of 45% predicted with clear obstruction.  She smoked 2 pack/day x 40 years, quit  17 years ago according to patient. She is a frequent exacerbation phenotype. She uses 2 L of oxygen continuously with exertion and at night On chronic azithromycin therapy: therapy for prevention of COPD exacerbations (ie. roflumilast or azithromycin) when appopriate.   lling, mild erythema   CC follow up COPD HPI No acute issues, has chronic SOB and DOE Uses oxygen severe emphysema with bronchiectasis with R pleural based nodule Uses oxygen accordingly, no fevers, no chills Different note-RT leg injury-scabbed and bruised No pain, no swelling, mild erythema No signs of infection at this time Patient is very activ,e  exercises daily She has increased HR with exertion I have dicussed wit patient that this is most likely normal response to her exertion and she has seen Dr Ubaldo Glassing for elevated HR and his work up according to patient is WNL  She has no signs of CHF at this time She is very active and doing well She has been dx with COPD for last 22 years  She feels her albuterol dose in not enough    No acute issues, has chCurrent Medication:   Current Outpatient Medications:  .  albuterol (ACCUNEB) 1.25 MG/3ML nebulizer solution, Take 3 mLs (1.25 mg total) by nebulization every 6 (six) hours as needed., Disp: 75 mL, Rfl: 5 .  albuterol (PROVENTIL HFA;VENTOLIN HFA) 108 (90 Base) MCG/ACT inhaler, Inhale 2 puffs into the lungs every 6 (six) hours as needed for wheezing or shortness of breath., Disp: 1 Inhaler, Rfl: 6 .  Ascorbic Acid  (VITAMIN C PO), Take 1 tablet by mouth daily., Disp: , Rfl:  .  aspirin 81 MG tablet, Take 81 mg by mouth at bedtime. , Disp: , Rfl:  .  azithromycin (ZITHROMAX) 250 MG tablet, TAKE 1 TABLET EVERY DAY, Disp: 90 tablet, Rfl: 0 .  HYDROcodone-acetaminophen (NORCO/VICODIN) 5-325 MG tablet, Take 1 tablet by mouth every 4 (four) hours as needed for moderate pain or severe pain., Disp: 60 tablet, Rfl: 0 .  losartan (COZAAR) 50 MG tablet, Take 1 tablet (50 mg total) by mouth daily., Disp: 90 tablet, Rfl: 3 .  Multiple Vitamins-Minerals (PRESERVISION AREDS) TABS, Take 1 tablet by mouth 2 (two) times daily., Disp: , Rfl:  .  Omega-3 Fatty Acids (FISH OIL) 1000 MG CAPS, Take 1 capsule by mouth 2 (two) times daily. , Disp: , Rfl:  .  STIOLTO RESPIMAT 2.5-2.5 MCG/ACT AERS, INHALE 2 PUFFS INTO THE LUNGS EVERY DAY, Disp: 12 g, Rfl: 1     ALLERGIES   Patient has no known allergies.     REVIEW OF SYSTEMS   Review of Systems  Constitutional: Negative for chills, fever, malaise/fatigue and weight loss.  HENT: Negative for congestion.   Respiratory: Negative for cough, hemoptysis, shortness of breath and wheezing.        Chronic DOE  Cardiovascular: Negative for chest pain, palpitations and leg swelling.  Endo/Heme/Allergies: Does not bruise/bleed easily.  All other systems reviewed and are negative.  BP 140/88 (BP Location: Left Arm, Cuff Size: Normal)   Pulse 97   Ht 5\' 1"  (1.549 m)   Wt 109 lb (49.4 kg)   SpO2 92%   BMI 20.60 kg/m    PHYSICAL EXAM   Physical Exam  Constitutional: She is oriented to person, place, and time. No distress.  Eyes: Pupils are equal, round, and reactive to light.  Cardiovascular: Normal rate, regular rhythm and normal heart sounds.  No murmur heard. Pulmonary/Chest: Effort normal and breath sounds normal. No respiratory distress. She has no wheezes. She has no rales.  Neurological: She is alert and oriented to person, place, and time.  Skin: Skin is  warm. She is not diaphoretic.         ASSESSMENT/PLAN   84 yo white female with Gold Stage D moderate/severe COPD on chronic oxygen therapy with chronic hypoxic resp failure previous  CT chest shows severe emphysema and Bronchiectasis and RLL pleural based nodule   #1 shortness of breath secondary to moderate to severe COPD with hypoxic respiratory failure and severe emphysema Continue inhalers and oxygen as prescribed  #2 severe COPD -continue stiolto(AC and LABA) -Albuterol as needed-increased to 2.5mg  dosing At this time, and in discussion with patient regarding elevated HR, we will stop Azithromycin and assess HR in the following months  #3 chronic hypoxic respiratory failure -continue oxygen as needed She uses and benefits from oxygen therapy   #4.Exercises daily (40 minutes daily)   #5 right pleural based nodule Patient DOES NOT WANT repeat CT chest to assess interval changes. SHE DOES NOT want to pursue any further diagnostic testing. She states that "She is happy with her life and happy with the Misty Ferguson and if its her time to go, she will go happily"     Patient satisfied with Plan of action and management. All questions answered  Follow up 6 months  Kamiya Acord Patricia Pesa, M.D.  [facility]  Medical Director [place] Director [facility] Cardio-Pulmonary Department

## 2018-03-04 NOTE — Patient Instructions (Addendum)
STOP AZITHROMYCIN AND ASSESS HR CONTINUE INHALERS AS PRESCRIBED  CONTINUE OXYGEN WITH EXERTION AND AT NIGHT  Increase the dose of Albuterol NEB

## 2018-03-13 ENCOUNTER — Other Ambulatory Visit: Payer: Self-pay | Admitting: Internal Medicine

## 2018-04-03 ENCOUNTER — Encounter: Payer: Self-pay | Admitting: Family Medicine

## 2018-04-03 ENCOUNTER — Ambulatory Visit (INDEPENDENT_AMBULATORY_CARE_PROVIDER_SITE_OTHER): Payer: Medicare HMO | Admitting: Family Medicine

## 2018-04-03 ENCOUNTER — Encounter: Payer: Self-pay | Admitting: Lab

## 2018-04-03 VITALS — BP 160/98 | HR 111 | Temp 97.9°F | Ht 61.0 in | Wt 108.4 lb

## 2018-04-03 DIAGNOSIS — J441 Chronic obstructive pulmonary disease with (acute) exacerbation: Secondary | ICD-10-CM | POA: Diagnosis not present

## 2018-04-03 DIAGNOSIS — H353221 Exudative age-related macular degeneration, left eye, with active choroidal neovascularization: Secondary | ICD-10-CM | POA: Diagnosis not present

## 2018-04-03 MED ORDER — PREDNISONE 20 MG PO TABS: ORAL_TABLET | ORAL | 0 refills | Status: DC

## 2018-04-03 NOTE — Patient Instructions (Signed)
Take the prednisone as directed - 60 mg taper  Continue azithromycin Continue oxygen and regular medicines   Update if not starting to improve in a week or if worsening    Also -if fever or other new symptoms

## 2018-04-03 NOTE — Progress Notes (Signed)
Subjective:    Patient ID: Misty Ferguson, female    DOB: 03/21/1934, 84 y.o.   MRN: [id number]  HPI Here for c/o cough   84 yo pt of Dr [name] with hx of HTN and emphysema (02 dependent) Gold Stage D mod/sevee copd -sees pulmonary (also bronchiectasis and RLL nodule)  Former smoker   Albuterol stiolto (AC and LABA)  Pulse ox 91% on 82 L of 02  Temp: 97.9 F (36.6 C)   Was recently on zpak from pulmonary (was on it long term for years)  She had to re start it /on it now   bp and pulse are elevated today  Deep cough with phlegm production - about 5 days  Does not want to get pneumonia  Wheezing - more than usual  A little sob today  Nasal congestion - nasal d/c is light yellow Same for lungs   No fever  No sinus headache Ears and throat are ok  No allergies   Patient Active Problem List   Diagnosis Date Noted  . Injury of quadriceps muscle 11/19/2017  . Syncope 10/23/2017  . Senile purpura ([place]) 10/04/2016  . Peripheral neuropathy 07/24/2016  . Allergic rhinitis 12/23/2012  . Chronic hypoxemic respiratory failure ([place]) 08/25/2012  . COPD exacerbation ([place]) 07/03/2012  . MICROALBUMINURIA 04/26/2010  . HYPERCHOLESTEROLEMIA 03/24/2008  . Prediabetes 12/11/2006  . MACULAR DEGENERATION 12/10/2006  . GLAUCOMA 12/10/2006  . Essential hypertension, benign 12/10/2006  . Centrilobular emphysema ([place]) 12/10/2006  . Osteoporosis 12/10/2006   Past Medical History:  Diagnosis Date  . Chronic airway obstruction, not elsewhere classified   . Macular degeneration (senile) of retina, unspecified   . Obstructive chronic bronchitis with exacerbation ([place])   . Osteoporosis, unspecified   . Personal history of peptic ulcer disease   . Tobacco use disorder   . Type II or unspecified type diabetes mellitus without mention of complication, not stated as uncontrolled   . Unspecified disorder resulting from impaired renal function   . Unspecified essential hypertension   .  Unspecified glaucoma(365.9)    No past surgical history on file. Social History   Tobacco Use  . Smoking status: Former Smoker    Packs/day: 1.30    Years: 45.00    Pack years: 58.50    Types: Cigarettes    Last attempt to quit: 08/14/2003    Years since quitting: 14.6  . Smokeless tobacco: Never Used  Substance Use Topics  . Alcohol use: Yes    Comment: Occasional  . Drug use: No   No family history on file. No Known Allergies Current Outpatient Medications on File Prior to Visit  Medication Sig Dispense Refill  . albuterol (PROVENTIL HFA;VENTOLIN HFA) 108 (90 Base) MCG/ACT inhaler Inhale 2 puffs into the lungs every 6 (six) hours as needed for wheezing or shortness of breath. 1 Inhaler 6  . albuterol (PROVENTIL) (2.5 MG/3ML) 0.083% nebulizer solution Take 3 mLs (2.5 mg total) by nebulization every 6 (six) hours as needed for wheezing or shortness of breath. 75 mL 12  . Ascorbic Acid (VITAMIN C PO) Take 1 tablet by mouth daily.    Marland Kitchen aspirin 81 MG tablet Take 81 mg by mouth at bedtime.     Marland Kitchen azithromycin (ZITHROMAX) 250 MG tablet TAKE 1 TABLET EVERY DAY 90 tablet 0  . losartan (COZAAR) 50 MG tablet Take 1 tablet (50 mg total) by mouth daily. 90 tablet 3  . Multiple Vitamins-Minerals (PRESERVISION AREDS) TABS Take 1 tablet by mouth 2 (  two) times daily.    . Omega-3 Fatty Acids (FISH OIL) 1000 MG CAPS Take 1 capsule by mouth 2 (two) times daily.     Marland Kitchen STIOLTO RESPIMAT 2.5-2.5 MCG/ACT AERS INHALE 2 PUFFS INTO THE LUNGS EVERY DAY 12 g 1   No current facility-administered medications on file prior to visit.     Review of Systems  Constitutional: Negative for activity change, appetite change, fatigue, fever and unexpected weight change.  HENT: Positive for congestion and postnasal drip. Negative for ear discharge, ear pain, rhinorrhea, sinus pressure, sinus pain, sneezing, sore throat and voice change.   Eyes: Negative for pain, redness and visual disturbance.  Respiratory: Positive  for cough, chest tightness, shortness of breath and wheezing. Negative for stridor.   Cardiovascular: Negative for chest pain and palpitations.  Gastrointestinal: Negative for abdominal pain, blood in stool, constipation and diarrhea.  Endocrine: Negative for polydipsia and polyuria.  Genitourinary: Negative for dysuria, frequency and urgency.  Musculoskeletal: Negative for arthralgias, back pain and myalgias.  Skin: Negative for pallor and rash.  Allergic/Immunologic: Negative for environmental allergies.  Neurological: Negative for dizziness, syncope and headaches.  Hematological: Negative for adenopathy. Does not bruise/bleed easily.  Psychiatric/Behavioral: Negative for decreased concentration and dysphoric mood. The patient is not nervous/anxious.        Objective:   Physical Exam  Constitutional: She appears well-developed and well-nourished. No distress.  Slim and well appearing  Wearing [place] 02 at 3L  HENT:  Head: Normocephalic and atraumatic.  Right Ear: External ear normal.  Left Ear: External ear normal.  Mouth/Throat: Oropharynx is clear and moist.  Nares are injected and congested  No sinus tenderness Clear rhinorrhea and post nasal drip   Eyes: Pupils are equal, round, and reactive to light. Conjunctivae and EOM are normal. Right eye exhibits no discharge. Left eye exhibits no discharge.  Neck: Normal range of motion. Neck supple.  Cardiovascular: Normal rate and normal heart sounds.  Pulmonary/Chest: Effort normal. No stridor. No respiratory distress. She has wheezes. She has no rales. She exhibits no tenderness.  Diffusely distant bs No rales or rhonchi  Diffuse wheeze with prolonged exp phase Wheeze worse on forced exp    Lymphadenopathy:    She has no cervical adenopathy.  Neurological: She is alert.  Skin: Skin is warm and dry. No rash noted. No erythema.  Psychiatric: She has a normal mood and affect.          Assessment & Plan:   Problem List Items  Addressed This Visit      Respiratory   COPD exacerbation ([place]) - Primary    With increase in cough and wheezing  Continues 02 and recently started back on daily azithromycin from pulmonary  Not using albuterol (per pt-not very effective) Will tx with course of prednisone 60 mg taper  Enc hydration/rest/avoid heat and other triggers Update if not starting to improve in a week or if worsening  (esp if fever or inc phlegm or sob)        Relevant Medications   predniSONE (DELTASONE) 20 MG tablet

## 2018-04-03 NOTE — Assessment & Plan Note (Signed)
With increase in cough and wheezing  Continues 02 and recently started back on daily azithromycin from pulmonary  Not using albuterol (per pt-not very effective) Will tx with course of prednisone 60 mg taper  Enc hydration/rest/avoid heat and other triggers Update if not starting to improve in a week or if worsening  (esp if fever or inc phlegm or sob)

## 2018-04-28 IMAGING — CR DG HIP (WITH OR WITHOUT PELVIS) 2-3V*L*
1 series · 3 of 3 positions shown · non-contrast
Comparison: None.

CLINICAL DATA: Status post fall, with left hip pain. Initial
encounter.

EXAM:
DG HIP (WITH OR WITHOUT PELVIS) 2-3V LEFT

[Series 1: dg hip unilat w or w/o pelvis 2-3 views  · non-contrast · 0.14mm/px · 3 of 3 slices shown]
[im 1/3]
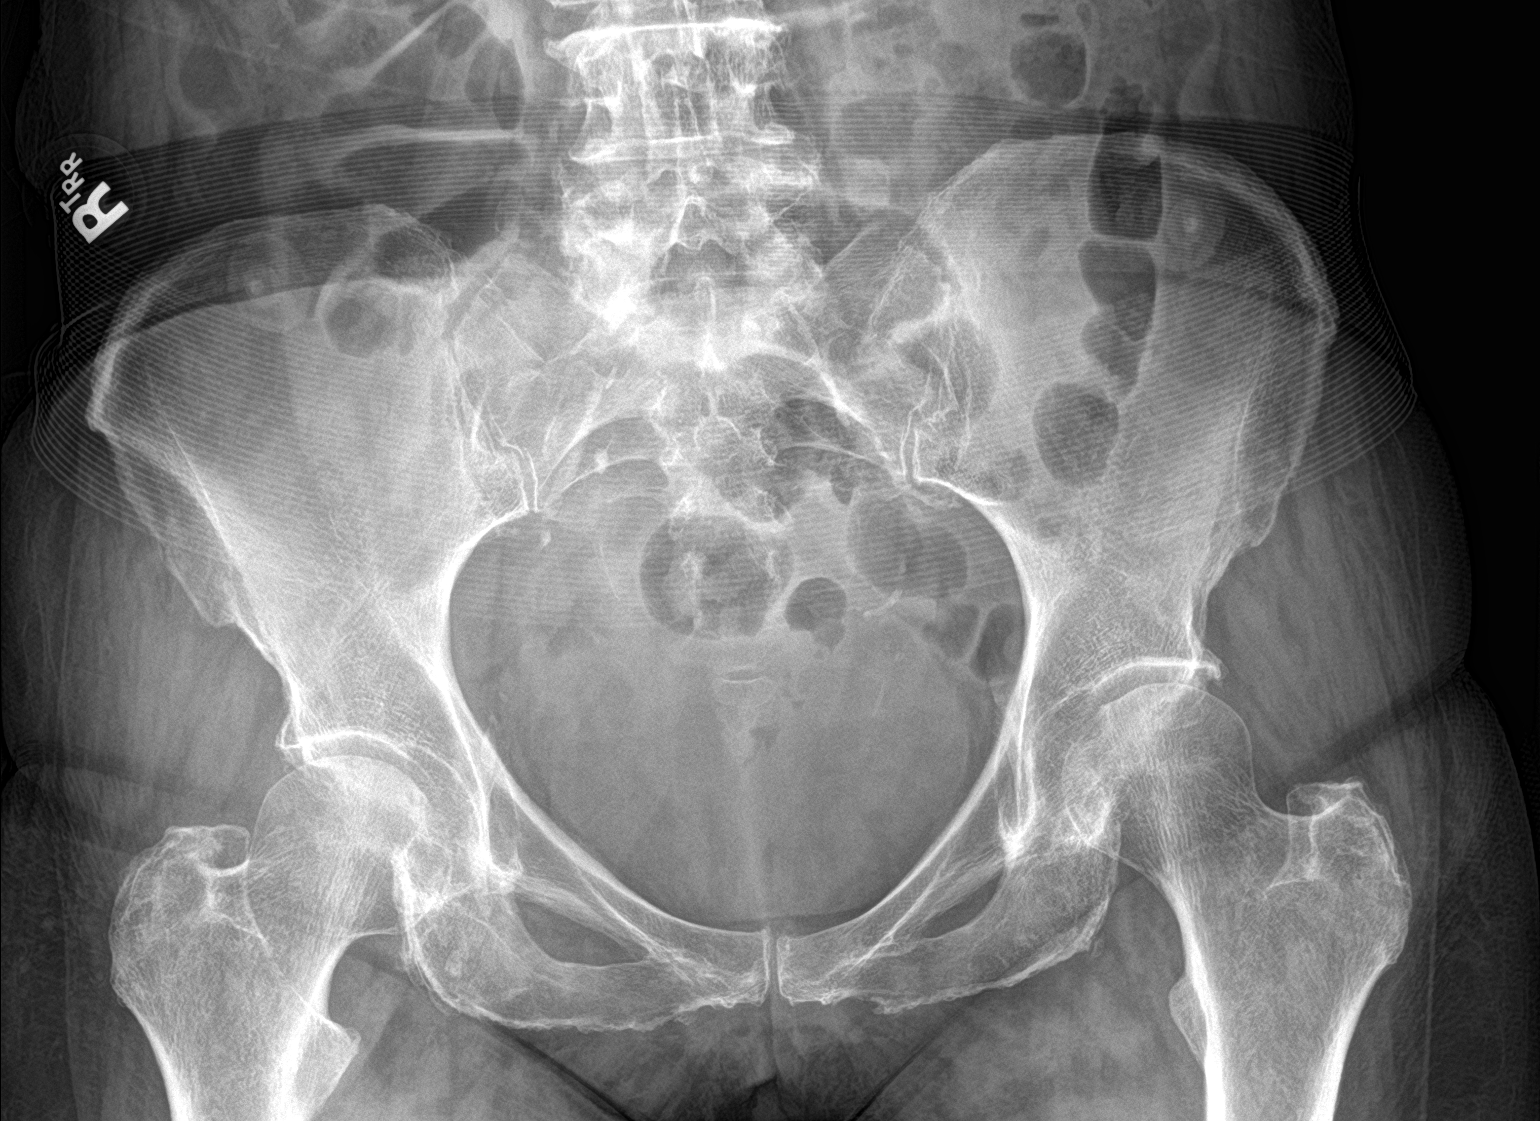
[im 2/3]
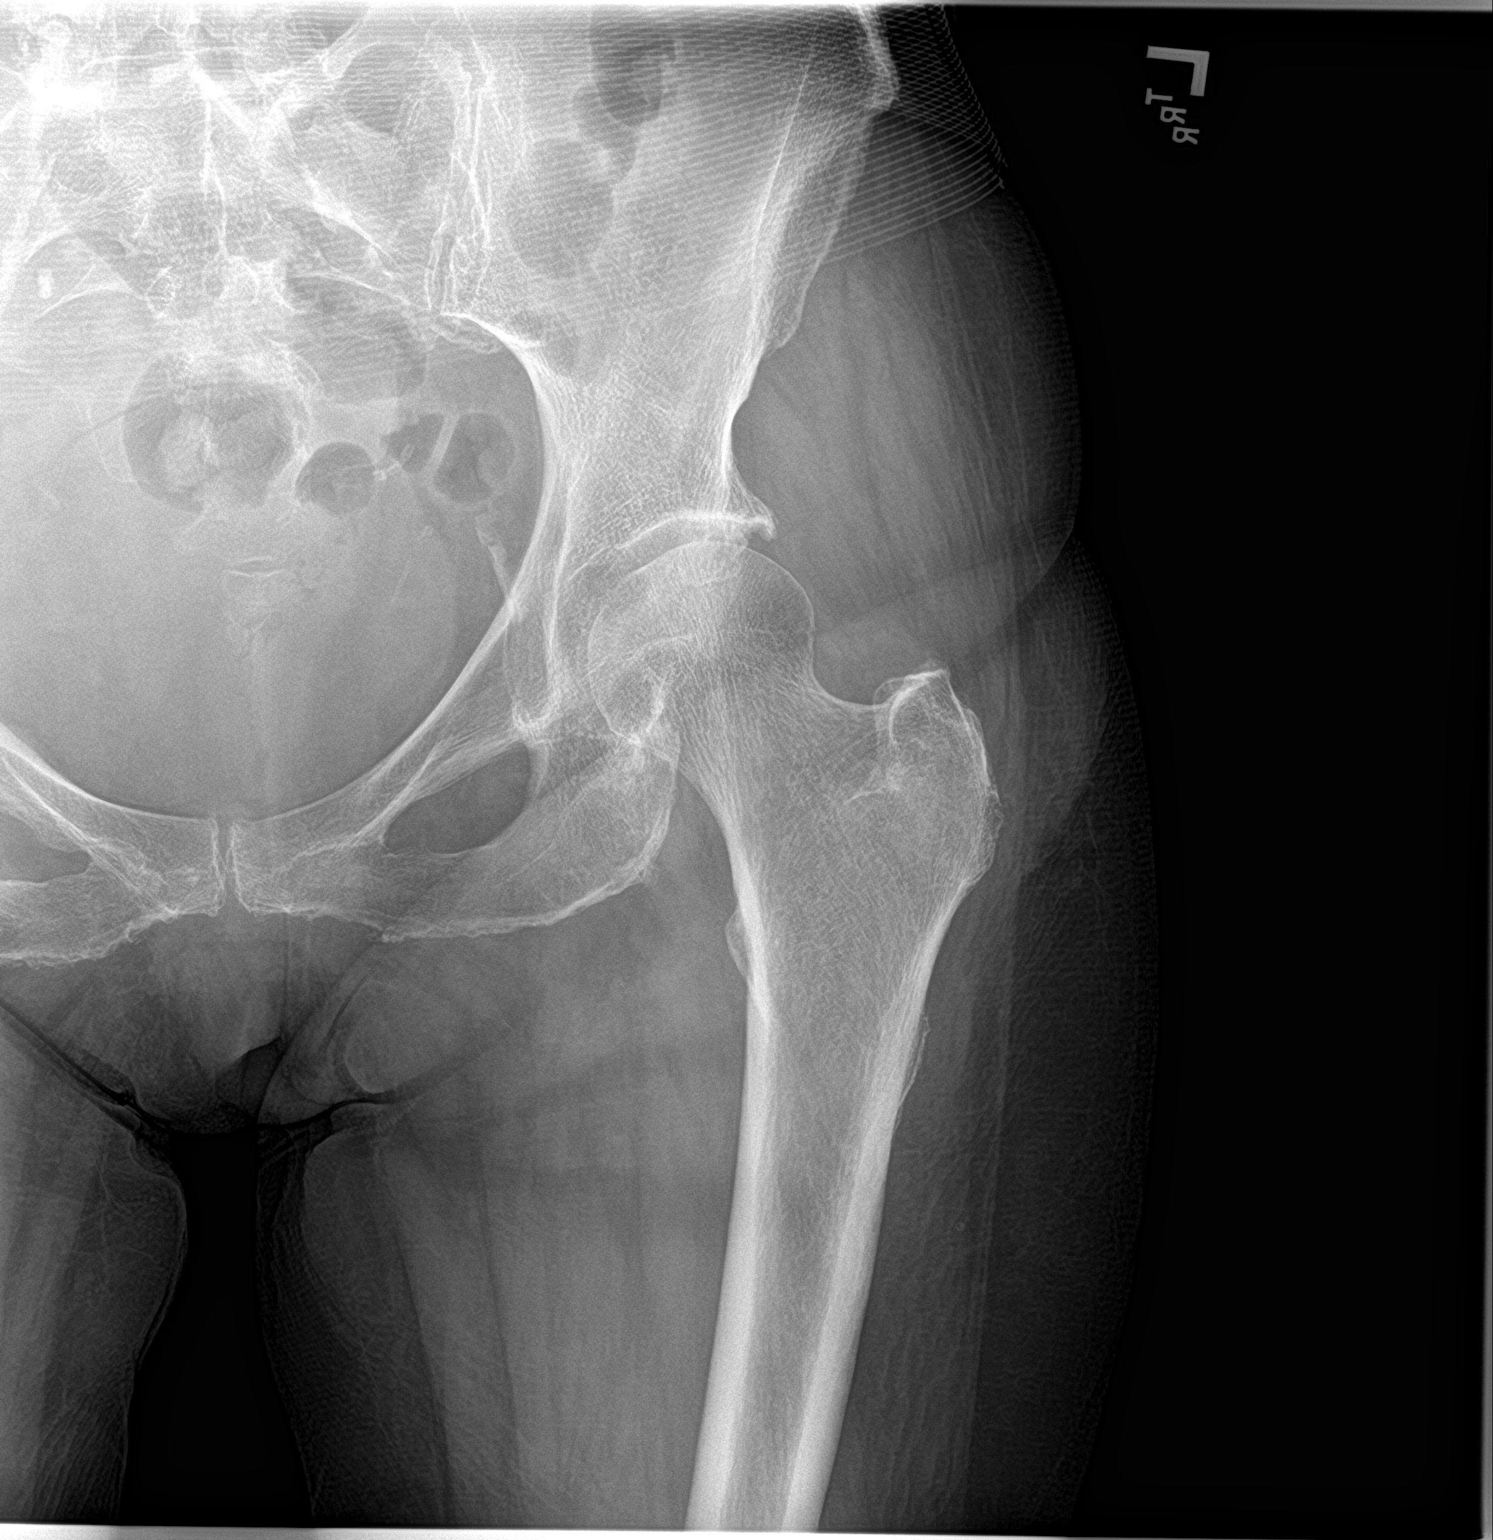
[im 3/3]
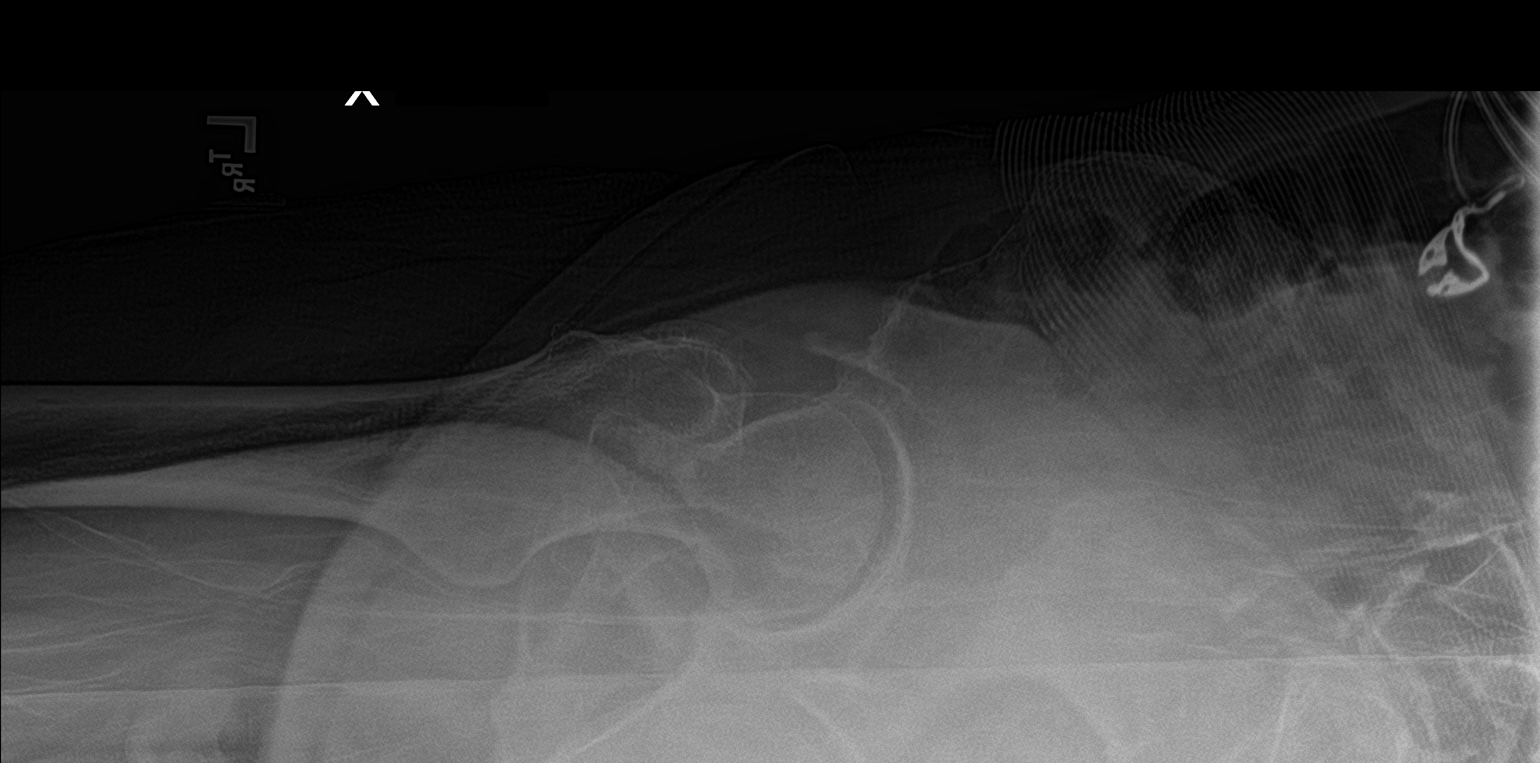

[3 of 3 positions shown; findings below may reference images not displayed]

FINDINGS: There is no evidence of fracture or dislocation. Both femoral heads
are seated normally within their respective acetabula. The proximal
left femur appears intact. No significant degenerative change is
appreciated. The sacroiliac joints are unremarkable in appearance.

The visualized bowel gas pattern is grossly unremarkable in
appearance.
IMPRESSION: No evidence of fracture or dislocation.

## 2018-05-01 ENCOUNTER — Ambulatory Visit (INDEPENDENT_AMBULATORY_CARE_PROVIDER_SITE_OTHER): Payer: Medicare HMO

## 2018-05-01 DIAGNOSIS — Z23 Encounter for immunization: Secondary | ICD-10-CM

## 2018-05-20 ENCOUNTER — Ambulatory Visit (INDEPENDENT_AMBULATORY_CARE_PROVIDER_SITE_OTHER): Payer: Medicare HMO | Admitting: Family Medicine

## 2018-05-20 ENCOUNTER — Encounter: Payer: Self-pay | Admitting: Family Medicine

## 2018-05-20 ENCOUNTER — Encounter: Payer: Self-pay | Admitting: *Deleted

## 2018-05-20 VITALS — BP 122/86 | HR 106 | Temp 97.5°F | Ht 61.0 in | Wt 107.8 lb

## 2018-05-20 DIAGNOSIS — J432 Centrilobular emphysema: Secondary | ICD-10-CM

## 2018-05-20 DIAGNOSIS — G609 Hereditary and idiopathic neuropathy, unspecified: Secondary | ICD-10-CM

## 2018-05-20 DIAGNOSIS — I10 Essential (primary) hypertension: Secondary | ICD-10-CM | POA: Diagnosis not present

## 2018-05-20 NOTE — Assessment & Plan Note (Signed)
Moderate control.. Not currently interested in trail of lyrica or higher dose gabapentin.

## 2018-05-20 NOTE — Patient Instructions (Signed)
Keep up healthy lifestyle changes!

## 2018-05-20 NOTE — Assessment & Plan Note (Signed)
Stable control, back at baseline following August exacerbation.

## 2018-05-20 NOTE — Progress Notes (Signed)
Subjective:    Patient ID: Misty Ferguson, female    DOB: 05-24-1934, 84 y.o.   MRN: [id number]  HPI 84 year old female with severe COPD on oxygen presents for 6 month follow up.   She is back to baseline. She is doing better with weather cooling down.  Maintaing on 3 L [place] using  Mainly when  Up walking around. Followed by pulmonary. On chronic azithromycin therapy: therapy for prevention of COPD exacerbations (ie. roflumilast or azithromycin) when appopriate.    Hypertension:  Good control on losartan. BP Readings from Last 3 Encounters:  05/20/18 122/86  04/03/18 (!) 160/98  03/04/18 140/88  Using medication without problems or lightheadedness: none Chest pain with exertion:none Edema:none Short of breath: back at baseline Average home BPs: Other issues:  She continues to have issues with neuropathy. Feels unsteady on feet.  no recent falls... Gabapentin did not help much but never went high dose. No  Further syncopal episodes. Left quadraceps tendon injury,  Back to baseline gait and leg. Still doing PT.  Social History /Family History/Past Medical History reviewed in detail and updated in EMR if needed. Blood pressure 122/86, pulse (!) 106, temperature (!) 97.5 F (36.4 C), temperature source Oral, height 5\' 1"  (1.549 m), weight 107 lb 12 oz (48.9 kg), SpO2 90 %.  Review of Systems  Constitutional: Negative for fatigue and fever.  HENT: Negative for congestion.   Eyes: Negative for pain.  Respiratory: Positive for shortness of breath. Negative for cough.   Cardiovascular: Negative for chest pain, palpitations and leg swelling.  Gastrointestinal: Positive for constipation. Negative for abdominal pain.  Genitourinary: Negative for dysuria and vaginal bleeding.  Musculoskeletal: Negative for back pain.  Neurological: Negative for syncope, light-headedness and headaches.  Psychiatric/Behavioral: Negative for dysphoric mood.       Objective:   Physical Exam    Constitutional: Vital signs are normal. She appears well-developed and well-nourished. She is cooperative.  Non-toxic appearance. She does not appear ill. No distress.  HENT:  Head: Normocephalic.  Right Ear: Hearing, tympanic membrane, external ear and ear canal normal.  Left Ear: Hearing, tympanic membrane, external ear and ear canal normal.  Nose: Nose normal.  Eyes: Pupils are equal, round, and reactive to light. Conjunctivae, EOM and lids are normal. Lids are everted and swept, no foreign bodies found.  Neck: Trachea normal and normal range of motion. Neck supple. Carotid bruit is not present. No thyroid mass and no thyromegaly present.  Cardiovascular: Normal rate, regular rhythm, S1 normal, S2 normal, normal heart sounds and intact distal pulses. Exam reveals no gallop.  No murmur heard. Pulmonary/Chest: Effort normal. No respiratory distress. She has decreased breath sounds. She has no wheezes. She has no rhonchi. She has no rales.  Tympanic sound to lungs, decreased air movement.  Abdominal: Soft. Normal appearance and bowel sounds are normal. She exhibits no distension, no fluid wave, no abdominal bruit and no mass. There is no hepatosplenomegaly. There is no tenderness. There is no rebound, no guarding and no CVA tenderness. No hernia.  Lymphadenopathy:    She has no cervical adenopathy.    She has no axillary adenopathy.  Neurological: She is alert. She has normal strength. No cranial nerve deficit or sensory deficit.  Skin: Skin is warm, dry and intact. No rash noted.  Psychiatric: Her speech is normal and behavior is normal. Judgment normal. Her mood appears not anxious. Cognition and memory are normal. She does not exhibit a depressed mood.  Assessment & Plan:

## 2018-05-20 NOTE — Assessment & Plan Note (Signed)
Well controlled. Continue current medication.  

## 2018-06-12 ENCOUNTER — Other Ambulatory Visit: Payer: Self-pay | Admitting: Internal Medicine

## 2018-06-12 NOTE — Telephone Encounter (Signed)
Please refill for 30 days and set up OV for re-assessment

## 2018-08-11 ENCOUNTER — Telehealth: Payer: Self-pay | Admitting: Family Medicine

## 2018-08-11 NOTE — Telephone Encounter (Signed)
Form completed and placed in Dr. [name]'s in box for signature. 

## 2018-08-11 NOTE — Telephone Encounter (Signed)
Pt spouse dropped off DM[employer] disability placard ppw to be filled out. I placed in Rx tower. Misty Ferguson requests a call when ready for pick up.

## 2018-08-14 ENCOUNTER — Other Ambulatory Visit: Payer: Self-pay | Admitting: Internal Medicine

## 2018-08-14 NOTE — Telephone Encounter (Signed)
Refill submitted for Stiolto.

## 2018-08-25 ENCOUNTER — Other Ambulatory Visit: Payer: Self-pay | Admitting: Internal Medicine

## 2018-08-25 MED ORDER — TIOTROPIUM BROMIDE-OLODATEROL 2.5-2.5 MCG/ACT IN AERS: 2.0000 | INHALATION_SPRAY | Freq: Every day | RESPIRATORY_TRACT | 1 refills | Status: DC

## 2018-08-25 NOTE — Telephone Encounter (Signed)
Paper refill request from [facility] for Stiolto inhaler. Patient has appt 09/04/18.

## 2018-09-04 ENCOUNTER — Encounter: Payer: Self-pay | Admitting: Internal Medicine

## 2018-09-04 ENCOUNTER — Ambulatory Visit: Payer: Medicare HMO | Admitting: Internal Medicine

## 2018-09-04 VITALS — BP 154/80 | HR 111 | Resp 16 | Ht 61.0 in | Wt 106.0 lb

## 2018-09-04 DIAGNOSIS — J9611 Chronic respiratory failure with hypoxia: Secondary | ICD-10-CM | POA: Diagnosis not present

## 2018-09-04 DIAGNOSIS — J449 Chronic obstructive pulmonary disease, unspecified: Secondary | ICD-10-CM | POA: Diagnosis not present

## 2018-09-04 NOTE — Patient Instructions (Signed)
Continue Oxygen as prescribed  Continue inhalers as prescribed   STOP Azithromycin now and watch for any changes in breathing

## 2018-09-04 NOTE — Progress Notes (Signed)
[place] Pulmonary Medicine Consultation     Date: 09/04/2018,   MRN# [id number] Misty Ferguson 01/21/1934    Admission                  Current   Synopsis: Misty Ferguson was first seen by the [facility] pulmonary clinic in the summer of 2013. She has COPD. Simple spirometry performed in clinic showed an FEV1 of 45% predicted with clear obstruction.  She smoked 2 pack/day x 40 years, quit  17 years ago according to patient. She is a frequent exacerbation phenotype. She uses 2 L of oxygen continuously with exertion and at night On chronic azithromycin therapy: therapy for prevention of COPD exacerbations (ie. roflumilast or azithromycin) when appopriate.   lling, mild erythema CC follow up COPD  HPI +chronic SOB +chronic DOE Uses oxygen -benefits from therapy 24/7   No signs of infection at this time No signs of exacerbation at this time  She is active exercises daily No signs of CHF No signs of lower ext swelling  She has been re-started back on chronic Azithromycin We will stop and assess resp status    No signs of CHF or lower ext swelling at t  ssues, has chCurrent Medication:   Current Outpatient Medications:  .  albuterol (PROVENTIL HFA;VENTOLIN HFA) 108 (90 Base) MCG/ACT inhaler, Inhale 2 puffs into the lungs every 6 (six) hours as needed for wheezing or shortness of breath., Disp: 1 Inhaler, Rfl: 6 .  albuterol (PROVENTIL) (2.5 MG/3ML) 0.083% nebulizer solution, Take 3 mLs (2.5 mg total) by nebulization every 6 (six) hours as needed for wheezing or shortness of breath., Disp: 75 mL, Rfl: 12 .  Ascorbic Acid (VITAMIN C PO), Take 1 tablet by mouth daily., Disp: , Rfl:  .  aspirin 81 MG tablet, Take 81 mg by mouth at bedtime. , Disp: , Rfl:  .  azithromycin (ZITHROMAX) 250 MG tablet, TAKE 1 TABLET EVERY DAY, Disp: 90 tablet, Rfl: 0 .  losartan (COZAAR) 50 MG tablet, Take 1 tablet (50 mg total) by mouth daily., Disp: 90 tablet, Rfl: 3 .  Multiple  Vitamins-Minerals (PRESERVISION AREDS) TABS, Take 1 tablet by mouth 2 (two) times daily., Disp: , Rfl:  .  Omega-3 Fatty Acids (FISH OIL) 1000 MG CAPS, Take 1 capsule by mouth 2 (two) times daily. , Disp: , Rfl:  .  Tiotropium Bromide-Olodaterol (STIOLTO RESPIMAT) 2.5-2.5 MCG/ACT AERS, Inhale 2 puffs into the lungs daily., Disp: 12 g, Rfl: 1     ALLERGIES   Patient has no known allergies.    Review of Systems:  Gen:  Denies  fever, sweats, chills weigh loss  HEENT: Denies blurred vision, double vision, ear pain, eye pain, hearing loss, nose bleeds, sore throat Cardiac:  No dizziness, chest pain or heaviness, chest tightness,edema, No JVD Resp:   No cough, -sputum production, +shortness of breath,-wheezing, -hemoptysis,  Gi: Denies swallowing difficulty, stomach pain, nausea or vomiting, diarrhea, constipation, bowel incontinence Gu:  Denies bladder incontinence, burning urine Ext:   Denies Joint pain, stiffness or swelling Skin: Denies  skin rash, easy bruising or bleeding or hives Endoc:  Denies polyuria, polydipsia , polyphagia or weight change Psych:   Denies depression, insomnia or hallucinations  Other:  All other systems negative  BP (!) 154/80 (BP Location: Left Arm, Cuff Size: Normal)   Pulse (!) 111   Resp 16   Ht 5\' 1"  (1.549 m)   Wt 106 lb (48.1 kg)   SpO2 94%  BMI 20.03 kg/m    Physical Examination:   GENERAL:NAD, no fevers, chills, no weakness no fatigue HEAD: Normocephalic, atraumatic.  EYES: PERLA, EOMI No scleral icterus.  MOUTH: Moist mucosal membrane.  EAR, NOSE, THROAT: Clear without exudates. No external lesions.  NECK: Supple. No thyromegaly.  No JVD.  PULMONARY: CTA B/L no wheezing, rhonchi, crackles CARDIOVASCULAR: S1 and S2. Regular rate and rhythm. No murmurs GASTROINTESTINAL: Soft, nontender, nondistended. Positive bowel sounds.  MUSCULOSKELETAL: No swelling, clubbing, or edema.  NEUROLOGIC: No gross focal neurological deficits. 5/5 strength  all extremities SKIN: No ulceration, lesions, rashes, or cyanosis.  PSYCHIATRIC: Insight, judgment intact. -depression -anxiety ALL OTHER ROS ARE NEGATIVE           ASSESSMENT/PLAN   85 yo pleasant white female with GOLD STAGE D SEVERE COPD with chronic hypoxic resp failure with previous abnormal CT chest with severe emphysema and Bronchiectasis with RLL pleural based noduled  SOB and DOE seems to be stable at this time Related to COPD and hypoxia Continue inhalers and oxygen as prescribed   Severe COPD based on PFT -continue Stiolto(AC and LABA) Albuterol as needed-last OV it was increased to 2.5mg  Azithromycin was stopped at last OV but was re-started as reqeusted by patient Will plan for STOP AZITHROMYCIN AND ASSESS RESP STATUS for 1 month   Chronic Hypoxic resp failure from COPD uses 24/7 at 3.5L [place] Continue oxygen as prescribed She uses and benefits from oxygen therapy She needs his to survive   EXERCISE as tolerated Daily for 40 mins  RT pleural based nodule Patient does NOT want repeat CT chest to assess interval changes. She does NOT want to pursue any further diagnostic testing She is satisfied with her plan of action    Patient/Family are satisfied with Plan of action and management. All questions answered Follow up in 6 months   Melvin Whiteford Patricia Pesa, M.D.  [facility]  Medical Director [place] Director [facility] Cardio-Pulmonary Department

## 2018-09-10 DIAGNOSIS — H353221 Exudative age-related macular degeneration, left eye, with active choroidal neovascularization: Secondary | ICD-10-CM | POA: Diagnosis not present

## 2018-10-06 ENCOUNTER — Telehealth: Payer: Self-pay

## 2018-10-06 DIAGNOSIS — R000 Tachycardia, unspecified: Secondary | ICD-10-CM

## 2018-10-06 NOTE — Telephone Encounter (Signed)
I spoke to patient and let her know of Dr. Zoila Shutter recommendations to NOT start the azithromycin back at this time. I also let her know he would like her to follow-up with cardiology. Referral placed for Dr. Ubaldo Glassing. Patient aware and knows to call us if she has any other questions.

## 2018-10-06 NOTE — Telephone Encounter (Signed)
I do NOT recommend starting at this time Recommend follow up Cardiology

## 2018-10-08 DIAGNOSIS — R0602 Shortness of breath: Secondary | ICD-10-CM | POA: Diagnosis not present

## 2018-10-08 DIAGNOSIS — R000 Tachycardia, unspecified: Secondary | ICD-10-CM | POA: Diagnosis not present

## 2018-10-08 DIAGNOSIS — J441 Chronic obstructive pulmonary disease with (acute) exacerbation: Secondary | ICD-10-CM | POA: Diagnosis not present

## 2018-10-08 DIAGNOSIS — J9611 Chronic respiratory failure with hypoxia: Secondary | ICD-10-CM | POA: Diagnosis not present

## 2018-10-08 DIAGNOSIS — E7800 Pure hypercholesterolemia, unspecified: Secondary | ICD-10-CM | POA: Diagnosis not present

## 2018-10-08 DIAGNOSIS — J432 Centrilobular emphysema: Secondary | ICD-10-CM | POA: Diagnosis not present

## 2018-10-16 ENCOUNTER — Telehealth: Payer: Self-pay | Admitting: Family Medicine

## 2018-10-16 DIAGNOSIS — E7800 Pure hypercholesterolemia, unspecified: Secondary | ICD-10-CM

## 2018-10-16 DIAGNOSIS — R7303 Prediabetes: Secondary | ICD-10-CM

## 2018-10-16 DIAGNOSIS — M810 Age-related osteoporosis without current pathological fracture: Secondary | ICD-10-CM

## 2018-10-16 NOTE — Telephone Encounter (Signed)
-----   Message from Ellamae Sia sent at 10/06/2018  3:23 PM EST ----- Regarding: Lab orders for Fridday, 3.6.20 Patient is scheduled for CPX labs, please order future labs, Thanks , Karna Christmas

## 2018-10-17 ENCOUNTER — Other Ambulatory Visit (INDEPENDENT_AMBULATORY_CARE_PROVIDER_SITE_OTHER): Payer: Medicare HMO

## 2018-10-17 DIAGNOSIS — M810 Age-related osteoporosis without current pathological fracture: Secondary | ICD-10-CM

## 2018-10-17 DIAGNOSIS — R7303 Prediabetes: Secondary | ICD-10-CM

## 2018-10-17 DIAGNOSIS — E7800 Pure hypercholesterolemia, unspecified: Secondary | ICD-10-CM

## 2018-10-17 LAB — LIPID PANEL
CHOL/HDL RATIO: 2
Cholesterol: 281 mg/dL — ABNORMAL HIGH (ref 0–200)
HDL: 165.6 mg/dL (ref >39.00)
LDL Cholesterol: 103 mg/dL — ABNORMAL HIGH (ref 0–99)
NonHDL: 115.53
Triglycerides: 62 mg/dL (ref 0.0–149.0)
VLDL: 12.4 mg/dL (ref 0.0–40.0)

## 2018-10-17 LAB — VITAMIN D 25 HYDROXY (VIT D DEFICIENCY, FRACTURES): VITD: 33.32 ng/mL (ref 30.00–100.00)

## 2018-10-17 LAB — HEMOGLOBIN A1C: Hgb A1c MFr Bld: 5.7 % (ref 4.6–6.5)

## 2018-10-17 LAB — COMPREHENSIVE METABOLIC PANEL
ALK PHOS: 57 U/L (ref 39–117)
ALT: 19 U/L (ref 0–35)
AST: 22 U/L (ref 0–37)
Albumin: 5.1 g/dL (ref 3.5–5.2)
BUN: 22 mg/dL (ref 6–23)
CHLORIDE: 102 meq/L (ref 96–112)
CO2: 27 meq/L (ref 19–32)
Calcium: 10.3 mg/dL (ref 8.4–10.5)
Creatinine, Ser: 0.94 mg/dL (ref 0.40–1.20)
GFR: 56.96 mL/min — AB (ref >60.00)
GLUCOSE: 96 mg/dL (ref 70–99)
POTASSIUM: 4.5 meq/L (ref 3.5–5.1)
SODIUM: 139 meq/L (ref 135–145)
TOTAL PROTEIN: 7.2 g/dL (ref 6.0–8.3)
Total Bilirubin: 0.6 mg/dL (ref 0.2–1.2)

## 2018-10-24 ENCOUNTER — Ambulatory Visit (INDEPENDENT_AMBULATORY_CARE_PROVIDER_SITE_OTHER): Payer: Medicare HMO | Admitting: Family Medicine

## 2018-10-24 ENCOUNTER — Encounter: Payer: Self-pay | Admitting: Family Medicine

## 2018-10-24 ENCOUNTER — Other Ambulatory Visit: Payer: Self-pay

## 2018-10-24 ENCOUNTER — Ambulatory Visit: Payer: Medicare HMO

## 2018-10-24 VITALS — BP 132/74 | HR 86 | Temp 97.6°F | Resp 18 | Ht 61.75 in | Wt 108.2 lb

## 2018-10-24 DIAGNOSIS — D692 Other nonthrombocytopenic purpura: Secondary | ICD-10-CM

## 2018-10-24 DIAGNOSIS — J432 Centrilobular emphysema: Secondary | ICD-10-CM

## 2018-10-24 DIAGNOSIS — R000 Tachycardia, unspecified: Secondary | ICD-10-CM | POA: Insufficient documentation

## 2018-10-24 DIAGNOSIS — Z0000 Encounter for general adult medical examination without abnormal findings: Secondary | ICD-10-CM | POA: Diagnosis not present

## 2018-10-24 DIAGNOSIS — I10 Essential (primary) hypertension: Secondary | ICD-10-CM | POA: Diagnosis not present

## 2018-10-24 DIAGNOSIS — R7303 Prediabetes: Secondary | ICD-10-CM

## 2018-10-24 DIAGNOSIS — J9611 Chronic respiratory failure with hypoxia: Secondary | ICD-10-CM

## 2018-10-24 DIAGNOSIS — E7800 Pure hypercholesterolemia, unspecified: Secondary | ICD-10-CM | POA: Diagnosis not present

## 2018-10-24 NOTE — Progress Notes (Signed)
Subjective:    Patient ID: Misty Ferguson, female    DOB: August 10, 1934, 85 y.o.   MRN: [id number]  HPI  The patient presents for annual medicare wellness, complete physical and review of chronic health problems. He/She also has the following acute concerns today:  Severe COPD, on continuous oxygen: Followed by Pulm. Dr. Mortimer Fries.  Tachycardia: Evaluated in 09/2018 with Dr. Ubaldo Glassing.. started on verapamil.  Jumping up to 100-125 with simple walking.  Hypertension:    Good control on losartan BP Readings from Last 3 Encounters:  10/24/18 132/74  09/04/18 (!) 154/80  05/20/18 122/86  Using medication without problems or lightheadedness: none Chest pain with exertion:none Edema:none Short of breath: stable Average home BPs: Other issues:  Elevated Cholesterol:  Improved control from last year. She is not interested in statin medication. Lab Results  Component Value Date   CHOL 281 (H) 10/17/2018   HDL 165.60 10/17/2018   LDLCALC 103 (H) 10/17/2018   LDLDIRECT 99.1 09/08/2013   TRIG 62.0 10/17/2018   CHOLHDL 2 10/17/2018  Using medications without problems: Muscle aches:  Diet compliance: good Exercise: walking Other complaints:   prediabetes: A1C improving   Advance directives and end of life planning reviewed in detail with patient and documented in EMR. Patient given handout on advance care directives if needed. HCPOA and living will updated if needed.   Hearing Screening   125Hz  250Hz  500Hz  1000Hz  2000Hz  3000Hz  4000Hz  6000Hz  8000Hz   Right ear:   20 20 20   0    Left ear:   20 20 20   0    Vision Screening Comments: Last eye exam in January 2020 by opthalmologist  Fall Risk  11/28/2017 11/05/2017 10/11/2017 10/04/2016 09/22/2015  Falls in the past year? Yes Yes No Yes Yes  Comment - - - pt reported fall in bedroom; no injury -  Number falls in past yr: 1 1 - 1 1  Injury with Fall? Yes Yes - No Yes  Risk Factor Category  High Fall Risk High Fall Risk - - -  Risk for fall due to :  History of fall(s) History of fall(s);Impaired balance/gait - - -  Follow up Falls evaluation completed;Education provided Falls prevention discussed - - -   PHQ2 today 0 Depression screen [facility] 2/9 11/05/2017 10/11/2017 10/04/2016  Decreased Interest 0 0 0  Down, Depressed, Hopeless 1 0 0  PHQ - 2 Score 1 0 0  Altered sleeping - 0 -  Tired, decreased energy - 0 -  Change in appetite - 0 -  Feeling bad or failure about yourself  - 0 -  Trouble concentrating - 0 -  Moving slowly or fidgety/restless - 0 -  Suicidal thoughts - 0 -  PHQ-9 Score - 0 -  Difficult doing work/chores - Not difficult at all -    Blood pressure 132/74, pulse 86, temperature 97.6 F (36.4 C), resp. rate 18, height 5' 1.75" (1.568 m), weight 108 lb 4 oz (49.1 kg), SpO2 95 %.   Review of Systems  Constitutional: Negative for fatigue and fever.  HENT: Negative for congestion.   Eyes: Negative for pain.  Respiratory: Positive for shortness of breath. Negative for cough.   Cardiovascular: Negative for chest pain, palpitations and leg swelling.  Gastrointestinal: Negative for abdominal pain.  Genitourinary: Negative for dysuria and vaginal bleeding.  Musculoskeletal: Negative for back pain.  Neurological: Negative for syncope, light-headedness and headaches.  Psychiatric/Behavioral: Negative for dysphoric mood.       Objective:  Physical Exam Constitutional:      General: She is not in acute distress.    Appearance: She is well-developed.     Comments: Slim and well appearing  Wearing [place] 02 at 3L  HENT:     Head: Normocephalic and atraumatic.     Right Ear: External ear normal.     Left Ear: External ear normal.  Eyes:     General:        Right eye: No discharge.        Left eye: No discharge.     Conjunctiva/sclera: Conjunctivae normal.     Pupils: Pupils are equal, round, and reactive to light.  Neck:     Musculoskeletal: Normal range of motion and neck supple.  Cardiovascular:     Rate and  Rhythm: Normal rate.     Heart sounds: Normal heart sounds.  Pulmonary:     Effort: Pulmonary effort is normal. No respiratory distress.     Breath sounds: Decreased air movement present. No stridor. No wheezing or rales.  Chest:     Chest wall: No tenderness.  Lymphadenopathy:     Cervical: No cervical adenopathy.  Skin:    General: Skin is warm and dry.     Findings: No erythema or rash.  Neurological:     Mental Status: She is alert.           Assessment & Plan:  The patient's preventative maintenance and recommended screening tests for an annual wellness exam were reviewed in full today. Brought up to date unless services declined.  Counselled on the importance of diet, exercise, and its role in overall health and mortality. The patient's FH and SH was reviewed, including their home life, tobacco status, and drug and alcohol status.   Vaccines:Uptodate Mammogram: last in 2009, not interested in continuing to follow.  DEXA: on fosamax (many years about 10 years) last checked 2009t -3.11.Marland Kitchen Stopped fosamax2012,  05/2016 femoral neck -2.7 After discussion.. she has chosen not to continue bone densities as she would not use a med to treat. Colon: Not interested in colon cancer screening. Discussed the risks and benefits of cancer screening... Pt voices understanding an chooses to not proceed with prevention as noted above.  " I am ready to go if the lord takes me" No indication for pap/DVE.

## 2018-10-24 NOTE — Assessment & Plan Note (Signed)
Stable followed by pulm.

## 2018-10-24 NOTE — Assessment & Plan Note (Signed)
Improved

## 2018-10-24 NOTE — Assessment & Plan Note (Signed)
On verapamil. Followed by Dr. Ubaldo Glassing.

## 2018-10-24 NOTE — Assessment & Plan Note (Signed)
Stable unchanged

## 2018-10-24 NOTE — Patient Instructions (Signed)
Keep up healthy eating and exercise as tolerated.

## 2018-10-24 NOTE — Assessment & Plan Note (Addendum)
Improved control  Now on losartan and verapamil. Continue current medication.

## 2018-11-24 ENCOUNTER — Other Ambulatory Visit: Payer: Self-pay | Admitting: Family Medicine

## 2018-12-04 ENCOUNTER — Other Ambulatory Visit: Payer: Self-pay | Admitting: Family Medicine

## 2018-12-04 MED ORDER — SILVER SULFADIAZINE 1 % EX CREA: 1.0000 "application " | TOPICAL_CREAM | Freq: Every day | CUTANEOUS | 0 refills | Status: DC

## 2019-03-14 DIAGNOSIS — C679 Malignant neoplasm of bladder, unspecified: Secondary | ICD-10-CM

## 2019-03-14 HISTORY — DX: Malignant neoplasm of bladder, unspecified: C67.9

## 2019-04-03 ENCOUNTER — Other Ambulatory Visit: Payer: Self-pay | Admitting: Internal Medicine

## 2019-04-09 ENCOUNTER — Ambulatory Visit (INDEPENDENT_AMBULATORY_CARE_PROVIDER_SITE_OTHER): Payer: Medicare HMO

## 2019-04-09 DIAGNOSIS — Z23 Encounter for immunization: Secondary | ICD-10-CM

## 2019-04-13 DIAGNOSIS — L309 Dermatitis, unspecified: Secondary | ICD-10-CM | POA: Diagnosis not present

## 2019-04-13 DIAGNOSIS — I872 Venous insufficiency (chronic) (peripheral): Secondary | ICD-10-CM | POA: Diagnosis not present

## 2019-04-13 DIAGNOSIS — L0102 Bockhart's impetigo: Secondary | ICD-10-CM | POA: Diagnosis not present

## 2019-04-13 DIAGNOSIS — L011 Impetiginization of other dermatoses: Secondary | ICD-10-CM | POA: Diagnosis not present

## 2019-04-13 DIAGNOSIS — D692 Other nonthrombocytopenic purpura: Secondary | ICD-10-CM | POA: Diagnosis not present

## 2019-04-23 ENCOUNTER — Other Ambulatory Visit: Payer: Self-pay

## 2019-04-23 ENCOUNTER — Ambulatory Visit (INDEPENDENT_AMBULATORY_CARE_PROVIDER_SITE_OTHER): Payer: Medicare HMO | Admitting: Internal Medicine

## 2019-04-23 ENCOUNTER — Encounter: Payer: Self-pay | Admitting: Internal Medicine

## 2019-04-23 VITALS — BP 138/88 | HR 113 | Temp 98.4°F | Ht 61.0 in | Wt 103.2 lb

## 2019-04-23 DIAGNOSIS — J449 Chronic obstructive pulmonary disease, unspecified: Secondary | ICD-10-CM

## 2019-04-23 DIAGNOSIS — J9611 Chronic respiratory failure with hypoxia: Secondary | ICD-10-CM | POA: Diagnosis not present

## 2019-04-23 NOTE — Patient Instructions (Addendum)
Continue Inhalers as prescribed Continue oxygen as prescribed Consider using NEBULIZER prior to physical exertion  Follow up with Dr Ubaldo Glassing [facility] Cardiology

## 2019-04-23 NOTE — Progress Notes (Signed)
[place] Pulmonary Medicine Consultation     Date: 04/23/2019,   MRN# [id number] Misty Ferguson 02/15/1934    AdmissionWeight: 103 lb 3.2 oz (46.8 kg)                 CurrentWeight: 103 lb 3.2 oz (46.8 kg)  Synopsis: Misty Ferguson was first seen by the [facility] pulmonary clinic in the summer of 2013. She has COPD. Simple spirometry performed in clinic showed an FEV1 of 45% predicted with clear obstruction.  She smoked 2 pack/day x 40 years, quit  17 years ago according to patient. She is a frequent exacerbation phenotype. She uses 2 L of oxygen continuously with exertion and at night On chronic azithromycin therapy: therapy for prevention of COPD exacerbations (ie. roflumilast or azithromycin) when appopriate.   lling, mild erythema CC  Follow up COPD  HPI Chronic shortness of breath Chronic dyspnea on exertion Patient uses and benefits from oxygen therapy 24/7  Patient has increased heart rate with exertion Patient does not want pulmonary rehab referral at this time Patient sees Dr. Ubaldo Glassing and has been prescribed verapamil for heart rate  No signs of infection at this time  no signs of exacerbation at this time  No evidence of CHF No lower extremity edema  Patient has been taken off chronic azithromycin therapy COPD seems to be stable at this time       No signs of CHF or lower ext swelling at t  ssues, has chCurrent Medication:   Current Outpatient Medications:  .  albuterol (PROVENTIL HFA;VENTOLIN HFA) 108 (90 Base) MCG/ACT inhaler, Inhale 2 puffs into the lungs every 6 (six) hours as needed for wheezing or shortness of breath., Disp: 1 Inhaler, Rfl: 6 .  albuterol (PROVENTIL) (2.5 MG/3ML) 0.083% nebulizer solution, Take 3 mLs (2.5 mg total) by nebulization every 6 (six) hours as needed for wheezing or shortness of breath., Disp: 75 mL, Rfl: 12 .  Ascorbic Acid (VITAMIN C PO), Take 1 tablet by mouth daily., Disp: , Rfl:  .  aspirin 81 MG tablet, Take 81  mg by mouth at bedtime. , Disp: , Rfl:  .  losartan (COZAAR) 50 MG tablet, TAKE 1 TABLET EVERY DAY, Disp: 90 tablet, Rfl: 3 .  Multiple Vitamins-Minerals (PRESERVISION AREDS) TABS, Take 1 tablet by mouth 2 (two) times daily., Disp: , Rfl:  .  Omega-3 Fatty Acids (FISH OIL) 1000 MG CAPS, Take 1 capsule by mouth 2 (two) times daily. , Disp: , Rfl:  .  silver sulfADIAZINE (SILVADENE) 1 % cream, Apply 1 application topically daily., Disp: 50 g, Rfl: 0 .  STIOLTO RESPIMAT 2.5-2.5 MCG/ACT AERS, INHALE 2 PUFFS INTO THE LUNGS DAILY., Disp: 12 g, Rfl: 1 .  verapamil (CALAN-SR) 120 MG CR tablet, Take 1 tablet by mouth daily., Disp: , Rfl:  .  mupirocin ointment (BACTROBAN) 2 %, , Disp: , Rfl:      ALLERGIES   Patient has no known allergies.    Review of Systems:  Gen:  Denies  fever, sweats, chills weight loss  HEENT: Denies blurred vision, double vision, ear pain, eye pain, hearing loss, nose bleeds, sore throat Cardiac:  No dizziness, chest pain or heaviness, chest tightness,edema, No JVD Resp:   No cough, -sputum production, -shortness of breath,-wheezing, -hemoptysis,  Gi: Denies swallowing difficulty, stomach pain, nausea or vomiting, diarrhea, constipation, bowel incontinence Gu:  Denies bladder incontinence, burning urine Ext:   Denies Joint pain, stiffness or swelling Skin: Denies  skin rash, easy  bruising or bleeding or hives Endoc:  Denies polyuria, polydipsia , polyphagia or weight change Psych:   Denies depression, insomnia or hallucinations  Other:  All other systems negative    BP 138/88 (BP Location: Left Arm, Cuff Size: Normal)   Pulse (!) 113   Temp 98.4 F (36.9 C) (Temporal)   Ht 5\' 1"  (1.549 m)   Wt 103 lb 3.2 oz (46.8 kg)   SpO2 90%   BMI 19.50 kg/m     Physical Examination:   GENERAL:NAD, no fevers, chills, no weakness no fatigue Patient is thin and frail HEAD: Normocephalic, atraumatic.  EYES: PERLA, EOMI No scleral icterus.  MOUTH: Moist mucosal  membrane.  EAR, NOSE, THROAT: Clear without exudates. No external lesions.  NECK: Supple. No thyromegaly.  No JVD.  PULMONARY: CTA B/L no wheezing, rhonchi, crackles CARDIOVASCULAR: S1 and S2. Regular rate and rhythm. No murmurs GASTROINTESTINAL: Soft, nontender, nondistended. Positive bowel sounds.  MUSCULOSKELETAL: No swelling, clubbing, or edema.  NEUROLOGIC: No gross focal neurological deficits. 5/5 strength all extremities SKIN: No ulceration, lesions, rashes, or cyanosis.  PSYCHIATRIC: Insight, judgment intact. -depression -anxiety ALL OTHER ROS ARE NEGATIVE                  ASSESSMENT/PLAN   85 year old pleasant white female with gold stage D severe COPD with chronic hypoxic respiratory failure with previous abnormal CT chest with severe emphysema and bronchiectasis with a right lower lobe pleural-based nodule  Shortness of breath and dyspnea exertion seems to be stable at this time however increased heart rate affects her breathing significantly Her shortness of breath and dyspnea on exertion is related to her COPD and hypoxia Continue inhalers and oxygen as prescribed  Severe COPD based on pulmonary function testing Continue Stiolto which is anticholinergic and long-acting beta agonist Patient uses albuterol nebs as needed I have advised to increase neb usage prior to physical exertion Chronic azithromycin therapy has been stopped And her COPD exacerbations have resolved   Chronic hypoxic respiratory failure from COPD She uses 24/7 oxygen therapy at 3.5 L nasal cannula Continue oxygen as prescribed She uses and benefits from oxygen therapy She definitely needs this to survive  Right pleural-based nodule based on CT scan findings Patient does not want repeat CT chest to assess interval changes She does not want to pursue any further diagnostic testing She is happy with her life and does not want any type of work-up for this nodule   COVID-19 EDUCATION: The  signs and symptoms of COVID-19 were discussed with the patient and how to seek care for testing.  The importance of social distancing was discussed today. Hand Washing Techniques and avoid touching face was advised.  MEDICATION ADJUSTMENTS/LABS AND TESTS ORDERED: Continue current inhalers and oxygen as prescribed Patient is to follow-up with Dr. Ubaldo Glassing for verapamil dosage adjustment   CURRENT MEDICATIONS REVIEWED AT [place]   Patient satisfied with Plan of action and management. All questions answered  Follow up in 6 months   Lilyrose Tanney Patricia Pesa, M.D.  [facility]  Medical Director [place] Director [facility] Cardio-Pulmonary Department

## 2019-04-24 ENCOUNTER — Ambulatory Visit (INDEPENDENT_AMBULATORY_CARE_PROVIDER_SITE_OTHER): Payer: Medicare HMO | Admitting: Family Medicine

## 2019-04-24 ENCOUNTER — Encounter: Payer: Self-pay | Admitting: Family Medicine

## 2019-04-24 VITALS — BP 138/78 | HR 97 | Temp 97.7°F | Ht 61.0 in | Wt 103.4 lb

## 2019-04-24 DIAGNOSIS — R319 Hematuria, unspecified: Secondary | ICD-10-CM | POA: Insufficient documentation

## 2019-04-24 DIAGNOSIS — R310 Gross hematuria: Secondary | ICD-10-CM | POA: Diagnosis not present

## 2019-04-24 DIAGNOSIS — K5900 Constipation, unspecified: Secondary | ICD-10-CM | POA: Diagnosis not present

## 2019-04-24 LAB — POC URINALSYSI DIPSTICK (AUTOMATED)
Bilirubin, UA: NEGATIVE
Blood, UA: 200
Glucose, UA: NEGATIVE
Ketones, UA: NEGATIVE
Nitrite, UA: NEGATIVE
Protein, UA: POSITIVE — AB
Spec Grav, UA: 1.025 (ref 1.010–1.025)
Urobilinogen, UA: 0.2 E.U./dL
pH, UA: 6 (ref 5.0–8.0)

## 2019-04-24 MED ORDER — SULFAMETHOXAZOLE-TRIMETHOPRIM 800-160 MG PO TABS: 1.0000 | ORAL_TABLET | Freq: Two times a day (BID) | ORAL | 0 refills | Status: DC

## 2019-04-24 NOTE — Patient Instructions (Addendum)
Try miralax for constipation - mix with fluid as directed  Start with twice daily and then once improved you can figure out how much you need  Store brand   Keep up water intake  Stool softeners are ok also  Take the bactrim ds for a suspected uti  If symptoms worsen let us know  We will call you when urine culture returns-this will confirm the diagnosis   If needed- we will send you to a urologist in the future

## 2019-04-24 NOTE — Progress Notes (Signed)
Subjective:    Patient ID: Misty Ferguson, female    DOB: 1934-03-26, 85 y.o.   MRN: [id number]  HPI 85 yo pt of Dr [name] presents with urinary symptoms   2-3 weeks of symptoms  Sometimes she has blood in her urine and sometimes not occ bright red to brown  Other times it is clear   Some constipation  No pelvic pain  Frequency- has urge to go all the time with little volume  Once in a while she has tingling to urinate (not pain but odd sensation)  Gets a momentary chilled feeling to urinate for a few seconds No odor   No fever  A little nauseated  A little washed out/lousy   Has not had uti for many years  Does not remember having blood   No hx of kidney problems  No kidney stones  She drinks a fair amt of water-at least 6 glasses per day     UA: Large blood and protein  Results for orders placed or performed in visit on 04/24/19  POCT Urinalysis Dipstick (Automated)  Result Value Ref Range   Color, UA Yellow/Brown    Clarity, UA Cloudy    Glucose, UA Negative Negative   Bilirubin, UA Negative    Ketones, UA Negative    Spec Grav, UA 1.025 1.010 - 1.025   Blood, UA 200 Ery/uL    pH, UA 6.0 5.0 - 8.0   Protein, UA Positive (A) Negative   Urobilinogen, UA 0.2 0.2 or 1.0 E.U./dL   Nitrite, UA Negative    Leukocytes, UA Small (1+) (A) Negative     Patient Active Problem List   Diagnosis Date Noted  . Hematuria 04/24/2019  . Sinus tachycardia 10/24/2018  . Senile purpura ([place]) 10/04/2016  . Peripheral neuropathy 07/24/2016  . Chronic hypoxemic respiratory failure ([place]) 08/25/2012  . HYPERCHOLESTEROLEMIA 03/24/2008  . Prediabetes 12/11/2006  . MACULAR DEGENERATION 12/10/2006  . Essential hypertension, benign 12/10/2006  . Centrilobular emphysema ([place]) 12/10/2006  . Osteoporosis 12/10/2006   Past Medical History:  Diagnosis Date  . Chronic airway obstruction, not elsewhere classified   . Macular degeneration (senile) of retina, unspecified   .  Obstructive chronic bronchitis with exacerbation ([place])   . Osteoporosis, unspecified   . Personal history of peptic ulcer disease   . Tobacco use disorder   . Type II or unspecified type diabetes mellitus without mention of complication, not stated as uncontrolled   . Unspecified disorder resulting from impaired renal function   . Unspecified essential hypertension   . Unspecified glaucoma(365.9)    History reviewed. No pertinent surgical history. Social History   Tobacco Use  . Smoking status: Former Smoker    Packs/day: 1.30    Years: 45.00    Pack years: 58.50    Types: Cigarettes    Quit date: 08/14/2003    Years since quitting: 15.7  . Smokeless tobacco: Never Used  Substance Use Topics  . Alcohol use: Yes    Comment: Occasional  . Drug use: No   History reviewed. No pertinent family history. No Known Allergies Current Outpatient Medications on File Prior to Visit  Medication Sig Dispense Refill  . albuterol (PROVENTIL HFA;VENTOLIN HFA) 108 (90 Base) MCG/ACT inhaler Inhale 2 puffs into the lungs every 6 (six) hours as needed for wheezing or shortness of breath. 1 Inhaler 6  . albuterol (PROVENTIL) (2.5 MG/3ML) 0.083% nebulizer solution Take 3 mLs (2.5 mg total) by nebulization every 6 (six) hours as needed  for wheezing or shortness of breath. 75 mL 12  . Ascorbic Acid (VITAMIN C PO) Take 1 tablet by mouth daily.    Marland Kitchen aspirin 81 MG tablet Take 81 mg by mouth at bedtime.     Marland Kitchen losartan (COZAAR) 50 MG tablet TAKE 1 TABLET EVERY DAY 90 tablet 3  . Multiple Vitamins-Minerals (PRESERVISION AREDS) TABS Take 1 tablet by mouth 2 (two) times daily.    . mupirocin ointment (BACTROBAN) 2 %     . Omega-3 Fatty Acids (FISH OIL) 1000 MG CAPS Take 1 capsule by mouth 2 (two) times daily.     . silver sulfADIAZINE (SILVADENE) 1 % cream Apply 1 application topically daily. 50 g 0  . STIOLTO RESPIMAT 2.5-2.5 MCG/ACT AERS INHALE 2 PUFFS INTO THE LUNGS DAILY. 12 g 1  . verapamil (CALAN-SR) 120  MG CR tablet Take 1 tablet by mouth daily.     No current facility-administered medications on file prior to visit.     Review of Systems  Constitutional: Negative for activity change, appetite change, fatigue, fever and unexpected weight change.  HENT: Negative for congestion, ear pain, rhinorrhea, sinus pressure and sore throat.   Eyes: Negative for pain, redness and visual disturbance.  Respiratory: Negative for cough, shortness of breath and wheezing.   Cardiovascular: Negative for chest pain and palpitations.  Gastrointestinal: Positive for constipation. Negative for abdominal pain, blood in stool and diarrhea.  Endocrine: Negative for polydipsia and polyuria.  Genitourinary: Positive for dysuria, frequency and urgency. Negative for decreased urine volume, flank pain and vaginal discharge.  Musculoskeletal: Negative for arthralgias, back pain and myalgias.  Skin: Negative for pallor and rash.  Allergic/Immunologic: Negative for environmental allergies.  Neurological: Negative for dizziness, syncope and headaches.  Hematological: Negative for adenopathy. Does not bruise/bleed easily.  Psychiatric/Behavioral: Negative for decreased concentration and dysphoric mood. The patient is not nervous/anxious.        Objective:   Physical Exam Constitutional:      General: She is not in acute distress.    Appearance: She is well-developed and normal weight.  HENT:     Head: Normocephalic and atraumatic.     Nose:     Comments: Wearing 02 via [place]  Eyes:     Conjunctiva/sclera: Conjunctivae normal.     Pupils: Pupils are equal, round, and reactive to light.  Neck:     Musculoskeletal: Normal range of motion and neck supple.  Cardiovascular:     Rate and Rhythm: Normal rate and regular rhythm.     Heart sounds: Normal heart sounds.  Pulmonary:     Effort: Pulmonary effort is normal.     Breath sounds: Normal breath sounds.  Abdominal:     General: Bowel sounds are normal. There is no  distension.     Palpations: Abdomen is soft.     Tenderness: There is abdominal tenderness. There is no right CVA tenderness, left CVA tenderness or rebound.     Comments: No cva tenderness  Mild suprapubic tenderness  Lymphadenopathy:     Cervical: No cervical adenopathy.  Skin:    Findings: No rash.  Neurological:     Mental Status: She is alert.  Psychiatric:        Mood and Affect: Mood normal.           Assessment & Plan:   Problem List Items Addressed This Visit      Other   Hematuria - Primary    Rev ua  Suspect uti given symptoms  tx with bactrim DS cx pending  If neg or no resolution will need ref to urology  Enc water intake Update if not starting to improve in a week or if worsening        Relevant Orders   Urine Culture   POCT Urinalysis Dipstick (Automated) (Completed)   Constipation    Recommend trial of miralax

## 2019-04-25 DIAGNOSIS — K5900 Constipation, unspecified: Secondary | ICD-10-CM | POA: Insufficient documentation

## 2019-04-25 LAB — URINE CULTURE
MICRO NUMBER:: 871715
Result:: NO GROWTH
SPECIMEN QUALITY:: ADEQUATE

## 2019-04-25 NOTE — Assessment & Plan Note (Signed)
Rev ua  Suspect uti given symptoms  tx with bactrim DS cx pending  If neg or no resolution will need ref to urology  Enc water intake Update if not starting to improve in a week or if worsening

## 2019-04-25 NOTE — Assessment & Plan Note (Signed)
Recommend trial of miralax

## 2019-04-27 ENCOUNTER — Encounter: Payer: Self-pay | Admitting: Family Medicine

## 2019-04-27 DIAGNOSIS — R310 Gross hematuria: Secondary | ICD-10-CM

## 2019-04-27 DIAGNOSIS — L309 Dermatitis, unspecified: Secondary | ICD-10-CM | POA: Diagnosis not present

## 2019-04-27 DIAGNOSIS — I872 Venous insufficiency (chronic) (peripheral): Secondary | ICD-10-CM | POA: Diagnosis not present

## 2019-04-27 DIAGNOSIS — D692 Other nonthrombocytopenic purpura: Secondary | ICD-10-CM | POA: Diagnosis not present

## 2019-04-27 DIAGNOSIS — L011 Impetiginization of other dermatoses: Secondary | ICD-10-CM | POA: Diagnosis not present

## 2019-04-27 NOTE — Telephone Encounter (Signed)
FYI PCP

## 2019-04-30 ENCOUNTER — Other Ambulatory Visit: Payer: Self-pay

## 2019-04-30 ENCOUNTER — Encounter: Payer: Self-pay | Admitting: Urology

## 2019-04-30 ENCOUNTER — Ambulatory Visit (INDEPENDENT_AMBULATORY_CARE_PROVIDER_SITE_OTHER): Payer: Medicare HMO | Admitting: Urology

## 2019-04-30 VITALS — BP 156/86 | HR 101 | Ht 61.0 in | Wt 100.0 lb

## 2019-04-30 DIAGNOSIS — R310 Gross hematuria: Secondary | ICD-10-CM

## 2019-04-30 LAB — URINALYSIS, COMPLETE
Bilirubin, UA: NEGATIVE
Glucose, UA: NEGATIVE
Ketones, UA: NEGATIVE
Nitrite, UA: NEGATIVE
Specific Gravity, UA: >1.03 — ABNORMAL HIGH (ref 1.005–1.030)
Urobilinogen, Ur: 0.2 mg/dL (ref 0.2–1.0)
pH, UA: 5.5 (ref 5.0–7.5)

## 2019-04-30 LAB — MICROSCOPIC EXAMINATION: RBC, Urine: >30 /hpf — AB (ref 0–2)

## 2019-04-30 NOTE — Progress Notes (Signed)
04/30/19 3:23 PM   Misty Ferguson 12-02-1933 [id number]  Referring provider: [name], MD [place],  [place] [address]  CC: Gross hematuria  HPI: I saw Misty Ferguson in urology clinic today in consultation for gross hematuria from Dr. [name].  She is an 85 year old female with COPD on oxygen who reports multiple episodes of red urine consistent with gross hematuria over the last few weeks.  She denies any dysuria, urgency, or frequency, and urine culture on 04/24/2019 showed no growth.  He was treated with a 2-week course of Bactrim that did not resolve the blood in the urine.  Urinalysis today shows 11-30 WBCs, greater than 30 RBCs, few bacteria, nitrite negative.  She has a 60-pack-year smoking history and quit 20 years ago.  She denies any other carcinogenic exposures.  She denies any recurrent urinary tract infections.  She also reports significant decreased energy and fatigue with physical activity.  There are no aggravating or alleviating factors.  Severity is moderate.  There is no recent cross-sectional imaging to review.     PMH: Past Medical History:  Diagnosis Date  . Chronic airway obstruction, not elsewhere classified   . Macular degeneration (senile) of retina, unspecified   . Obstructive chronic bronchitis with exacerbation ([place])   . Osteoporosis, unspecified   . Personal history of peptic ulcer disease   . Tobacco use disorder   . Type II or unspecified type diabetes mellitus without mention of complication, not stated as uncontrolled   . Unspecified disorder resulting from impaired renal function   . Unspecified essential hypertension   . Unspecified glaucoma(365.9)     Allergies: No Known Allergies  Family History: Denies family history of urologic malignancies  Social History:  reports that she quit smoking about 15 years ago. Her smoking use included cigarettes. She has a 58.50 pack-year smoking history. She has never used smokeless  tobacco. She reports current alcohol use. She reports that she does not use drugs.  ROS: Please see flowsheet from today's date for complete review of systems.  Physical Exam: BP (!) 156/86   Pulse (!) 101   Ht 5\' 1"  (1.549 m)   Wt 100 lb (45.4 kg)   BMI 18.89 kg/m    Constitutional:  Alert and oriented, No acute distress.  Elderly, frail-appearing, on oxygen Cardiovascular: No clubbing, cyanosis, or edema. Respiratory: On oxygen GI: Abdomen is soft, nontender, nondistended, no abdominal masses GU: No CVA tenderness Lymph: No cervical or inguinal lymphadenopathy. Skin: No rashes, bruises or suspicious lesions. Neurologic: Grossly intact, no focal deficits, moving all 4 extremities. Psychiatric: Normal mood and affect.  Laboratory Data: See HPI  Pertinent Imaging: None to review  Assessment & Plan:   In summary, the patient is an 85 year old female with severe COPD on oxygen and multiple episodes of gross hematuria over the last month and a negative urine culture.  Her history is very concerning for possible urothelial malignancy.  Additionally, she has significant fatigue and decreased energy worrisome for possible anemia.  We discussed common possible etiologies of hematuria including malignancy, urolithiasis, medical renal disease, and idiopathic. Standard workup recommended by the AUA includes imaging with CT urogram to assess the upper tracts, and cystoscopy. Cytology is performed on patient's with gross hematuria to look for malignant cells in the urine.  CT urogram and cystoscopy BMP/CBC today, evaluate for anemia  Billey Co, MD  [facility] [address], [place], [place] [address] [phone]

## 2019-04-30 NOTE — Patient Instructions (Signed)
Cystoscopy Cystoscopy is a procedure that is used to help diagnose and sometimes treat conditions that affect the lower urinary tract. The lower urinary tract includes the bladder and the urethra. The urethra is the tube that drains urine from the bladder. Cystoscopy is done using a thin, tube-shaped instrument with a light and camera at the end (cystoscope). The cystoscope may be hard or flexible, depending on the goal of the procedure. The cystoscope is inserted through the urethra, into the bladder. Cystoscopy may be recommended if you have:  Urinary tract infections that keep coming back.  Blood in the urine (hematuria).  An inability to control when you urinate (urinary incontinence) or an overactive bladder.  Unusual cells found in a urine sample.  A blockage in the urethra, such as a urinary stone.  Painful urination.  An abnormality in the bladder found during an intravenous pyelogram (IVP) or CT scan. Cystoscopy may also be done to remove a sample of tissue to be examined under a microscope (biopsy). Tell a health care provider about:  Any allergies you have.  All medicines you are taking, including vitamins, herbs, eye drops, creams, and over-the-counter medicines.  Any problems you or family members have had with anesthetic medicines.  Any blood disorders you have.  Any surgeries you have had.  Any medical conditions you have.  Whether you are pregnant or may be pregnant. What are the risks? Generally, this is a safe procedure. However, problems may occur, including:  Infection.  Bleeding.  Allergic reactions to medicines.  Damage to other structures or organs. What happens before the procedure?  Ask your health care provider about: ? Changing or stopping your regular medicines. This is especially important if you are taking diabetes medicines or blood thinners. ? Taking medicines such as aspirin and ibuprofen. These medicines can thin your blood. Do not take  these medicines unless your health care provider tells you to take them. ? Taking over-the-counter medicines, vitamins, herbs, and supplements.  Follow instructions from your health care provider about eating or drinking restrictions.  Ask your health care provider what steps will be taken to help prevent infection. These may include: ? Washing skin with a germ-killing soap. ? Taking antibiotic medicine.  You may have an exam or testing, such as: ? X-rays of the bladder, urethra, or kidneys. ? Urine tests to check for signs of infection.  Plan to have someone take you home from the hospital or clinic. What happens during the procedure?   You will be given one or more of the following: ? A medicine to help you relax (sedative). ? A medicine to numb the area (local anesthetic).  The area around the opening of your urethra will be cleaned.  The cystoscope will be passed through your urethra into your bladder.  Germ-free (sterile) fluid will flow through the cystoscope to fill your bladder. The fluid will stretch your bladder so that your health care provider can clearly examine your bladder walls.  Your doctor will look at the urethra and bladder. Your doctor may take a biopsy or remove stones.  The cystoscope will be removed, and your bladder will be emptied. The procedure may vary among health care providers and hospitals. What can I expect after the procedure? After the procedure, it is common to have:  Some soreness or pain in your abdomen and urethra.  Urinary symptoms. These include: ? Mild pain or burning when you urinate. Pain should stop within a few minutes after you urinate. This   may last for up to 1 week. ? A small amount of blood in your urine for several days. ? Feeling like you need to urinate but producing only a small amount of urine. Follow these instructions at home: Medicines  Take over-the-counter and prescription medicines only as told by your health care  provider.  If you were prescribed an antibiotic medicine, take it as told by your health care provider. Do not stop taking the antibiotic even if you start to feel better. General instructions  Return to your normal activities as told by your health care provider. Ask your health care provider what activities are safe for you.  Do not drive for 24 hours if you were given a sedative during your procedure.  Watch for any blood in your urine. If the amount of blood in your urine increases, call your health care provider.  Follow instructions from your health care provider about eating or drinking restrictions.  If a tissue sample was removed for testing (biopsy) during your procedure, it is up to you to get your test results. Ask your health care provider, or the department that is doing the test, when your results will be ready.  Drink enough fluid to keep your urine pale yellow.  Keep all follow-up visits as told by your health care provider. This is important. Contact a health care provider if you:  Have pain that gets worse or does not get better with medicine, especially pain when you urinate.  Have trouble urinating.  Have more blood in your urine. Get help right away if you:  Have blood clots in your urine.  Have abdominal pain.  Have a fever or chills.  Are unable to urinate. Summary  Cystoscopy is a procedure that is used to help diagnose and sometimes treat conditions that affect the lower urinary tract.  Cystoscopy is done using a thin, tube-shaped instrument with a light and camera at the end.  After the procedure, it is common to have some soreness or pain in your abdomen and urethra.  Watch for any blood in your urine. If the amount of blood in your urine increases, call your health care provider.  If you were prescribed an antibiotic medicine, take it as told by your health care provider. Do not stop taking the antibiotic even if you start to feel better. This  information is not intended to replace advice given to you by your health care provider. Make sure you discuss any questions you have with your health care provider. Document Released: 07/27/2000 Document Revised: 07/22/2018 Document Reviewed: 07/22/2018 Elsevier Patient Education  2020 [employer]  

## 2019-05-01 DIAGNOSIS — J432 Centrilobular emphysema: Secondary | ICD-10-CM | POA: Diagnosis not present

## 2019-05-01 LAB — BASIC METABOLIC PANEL
BUN/Creatinine Ratio: 14 (ref 12–28)
BUN: 21 mg/dL (ref 8–27)
CO2: 20 mmol/L (ref 20–29)
Calcium: 10 mg/dL (ref 8.7–10.3)
Chloride: 102 mmol/L (ref 96–106)
Creatinine, Ser: 1.46 mg/dL — ABNORMAL HIGH (ref 0.57–1.00)
GFR calc Af Amer: 38 mL/min/{1.73_m2} — ABNORMAL LOW (ref >59)
GFR calc non Af Amer: 33 mL/min/{1.73_m2} — ABNORMAL LOW (ref >59)
Glucose: 105 mg/dL — ABNORMAL HIGH (ref 65–99)
Potassium: 4.9 mmol/L (ref 3.5–5.2)
Sodium: 139 mmol/L (ref 134–144)

## 2019-05-01 LAB — CBC WITH DIFFERENTIAL/PLATELET
Basophils Absolute: 0.1 10*3/uL (ref 0.0–0.2)
Basos: 1 %
EOS (ABSOLUTE): 0.2 10*3/uL (ref 0.0–0.4)
Eos: 3 %
Hematocrit: 37.1 % (ref 34.0–46.6)
Hemoglobin: 12.4 g/dL (ref 11.1–15.9)
Immature Grans (Abs): 0 10*3/uL (ref 0.0–0.1)
Immature Granulocytes: 1 %
Lymphocytes Absolute: 1.4 10*3/uL (ref 0.7–3.1)
Lymphs: 22 %
MCH: 30.6 pg (ref 26.6–33.0)
MCHC: 33.4 g/dL (ref 31.5–35.7)
MCV: 92 fL (ref 79–97)
Monocytes Absolute: 0.5 10*3/uL (ref 0.1–0.9)
Monocytes: 8 %
Neutrophils Absolute: 4.3 10*3/uL (ref 1.4–7.0)
Neutrophils: 65 %
Platelets: 295 10*3/uL (ref 150–450)
RBC: 4.05 x10E6/uL (ref 3.77–5.28)
RDW: 11.5 % — ABNORMAL LOW (ref 11.7–15.4)
WBC: 6.5 10*3/uL (ref 3.4–10.8)

## 2019-05-03 LAB — CULTURE, URINE COMPREHENSIVE

## 2019-05-07 DIAGNOSIS — H353221 Exudative age-related macular degeneration, left eye, with active choroidal neovascularization: Secondary | ICD-10-CM | POA: Diagnosis not present

## 2019-05-12 ENCOUNTER — Other Ambulatory Visit: Payer: Self-pay

## 2019-05-12 ENCOUNTER — Ambulatory Visit
Admission: RE | Admit: 2019-05-12 | Discharge: 2019-05-12 | Disposition: A | Discharge status: Home / Self Care | Payer: Medicare HMO | Source: Ambulatory Visit | Attending: Urology | Admitting: Urology

## 2019-05-12 ENCOUNTER — Telehealth: Payer: Self-pay | Admitting: Urology

## 2019-05-12 DIAGNOSIS — N132 Hydronephrosis with renal and ureteral calculous obstruction: Secondary | ICD-10-CM | POA: Diagnosis not present

## 2019-05-12 DIAGNOSIS — R310 Gross hematuria: Secondary | ICD-10-CM | POA: Diagnosis not present

## 2019-05-12 MED ORDER — IOHEXOL 300 MG/ML  SOLN
75.0000 mL | Freq: Once | INTRAMUSCULAR | Status: AC | PRN
  Administered 2019-05-12: 75 mL via INTRAVENOUS

## 2019-05-12 NOTE — Telephone Encounter (Signed)
DONE

## 2019-05-12 NOTE — Telephone Encounter (Signed)
-----   Message from Billey Co, MD sent at 05/12/2019  9:49 AM EDT ----- Regarding: change follow up Please change her 10/1 8:30 visit to just an office visit, not cysto. Do not need to call her to tell her, just change the visit type, thanks  Nickolas Madrid, MD 05/12/2019

## 2019-05-14 ENCOUNTER — Encounter: Payer: Self-pay | Admitting: Urology

## 2019-05-14 ENCOUNTER — Other Ambulatory Visit: Payer: Self-pay | Admitting: Radiology

## 2019-05-14 ENCOUNTER — Other Ambulatory Visit: Payer: Self-pay

## 2019-05-14 ENCOUNTER — Ambulatory Visit
Admission: RE | Admit: 2019-05-14 | Discharge: 2019-05-14 | Disposition: A | Discharge status: Home / Self Care | Payer: Medicare HMO | Source: Ambulatory Visit | Attending: Urology | Admitting: Urology

## 2019-05-14 ENCOUNTER — Ambulatory Visit (INDEPENDENT_AMBULATORY_CARE_PROVIDER_SITE_OTHER): Payer: Medicare HMO | Admitting: Urology

## 2019-05-14 VITALS — BP 167/77 | HR 81 | Ht 61.0 in | Wt 102.0 lb

## 2019-05-14 DIAGNOSIS — D494 Neoplasm of unspecified behavior of bladder: Secondary | ICD-10-CM | POA: Insufficient documentation

## 2019-05-14 DIAGNOSIS — J984 Other disorders of lung: Secondary | ICD-10-CM | POA: Diagnosis not present

## 2019-05-14 NOTE — Progress Notes (Signed)
   05/14/2019 2:50 PM   [name] 19-Nov-1933 [id number]  Reason for visit: Discuss CT results  HPI: I saw Misty Ferguson in urology clinic today to discuss her CT results for work-up of gross hematuria.   I had a long conversation with the patient and her husband about the CT findings today.  This shows a 3 cm left-sided bladder tumor highly concerning for bladder cancer with obstruction of the left collecting system and upstream hydronephrosis likely secondary to muscle invasion.  There is no evidence of metastatic disease on the CT urogram.  Chest x-ray today shows a nodular density over the left upper lung in the perihilar region that is new, and a CT chest was recommended for further evaluation.  We will plan to proceed with surgery next week for TURBT and attempted stent placement versus left nephrostomy tube, and obtain CT chest with contrast after she is unobstructed and her renal function improves.  Labs: Urine culture 04/30/2019 shows no growth Serum creatinine 1.46, eGFR 33  We discussed transurethral resection of bladder tumor (TURBT) and risks and benefits at length. This is typically a 1 to 2-hour procedure done under general anesthesia in the operating room.  A scope is inserted through the urethra and used to resect abnormal tissue within the bladder, which is then sent to the pathologist to determine grade and stage of the tumor.  Risks include bleeding, infection, need for temporary Foley placement, and bladder perforation.  Treatment strategies are based on the type of tumor and depth of invasion.  We discussed the different treatment pathways for non-muscle invasive and muscle invasive bladder cancer.  Is very frank with the patient that typically when patients have upstream obstruction from a bladder tumor, this is as strong indicator for muscle invasive disease.  We discussed possible treatments for muscle invasive bladder cancer including gold standard of cystectomy,  lymphadenectomy, and urinary diversion, plus or minus neoadjuvant chemotherapy.  We also discussed alternative of tri-modal therapy that may be a better option for her with her age, frailty, and co-morbidities.  Schedule TURBT and attempted left ureteral stent placement next week.  If unable to place a ureteral stent she will require nephrostomy tube to maximize renal function for CT chest with contrast and possible chemo/immunotherapy  A total of 25 minutes were spent face-to-face with the patient, greater than 50% was spent in patient education, counseling, and coordination of care regarding bladder tumor and plan for TURBT.  Billey Co, [facility] [address], [place], [place] [address] [phone]

## 2019-05-14 NOTE — Patient Instructions (Signed)
Transurethral Resection of Bladder Tumor  Transurethral resection of a bladder tumor is the removal (resection) of a cancerous growth (tumor) on the inside wall of the bladder. The bladder is the organ that holds urine. The tumor is removed through the tube that carries urine out of the body (urethra). In a transurethral resection, a thin telescope with a light, a tiny camera, and an electric cutting edge (resectoscope) is passed through the urethra. In men, the opening of the urethra is at the end of the penis. In women, it is just above the opening of the vagina. Tell a health care provider about:  Any allergies you have.  All medicines you are taking, including vitamins, herbs, eye drops, creams, and over-the-counter medicines.  Any problems you or family members have had with anesthetic medicines.  Any blood disorders you have.  Any surgeries you have had.  Any medical conditions you have.  Any recent urinary tract infections you have had.  Whether you are pregnant or may be pregnant. What are the risks? Generally, this is a safe procedure. However, problems may occur, including:  Infection.  Bleeding.  Allergic reactions to medicines.  Damage to nearby structures or organs, such as: ? The urethra. ? The tubes that drain urine from the kidneys into the bladder (ureters).  Pain and burning during urination.  Difficulty urinating due to partial blockage of the urethra.  Inability to urinate (urinary retention). What happens before the procedure? Staying hydrated Follow instructions from your health care provider about hydration, which may include:  Up to 2 hours before the procedure - you may continue to drink clear liquids, such as water, clear fruit juice, black coffee, and plain tea.  Eating and drinking restrictions Follow instructions from your health care provider about eating and drinking, which may include:  8 hours before the procedure - stop eating heavy  meals or foods, such as meat, fried foods, or fatty foods.  6 hours before the procedure - stop eating light meals or foods, such as toast or cereal.  6 hours before the procedure - stop drinking milk or drinks that contain milk.  2 hours before the procedure - stop drinking clear liquids. Medicines Ask your health care provider about:  Changing or stopping your regular medicines. This is especially important if you are taking diabetes medicines or blood thinners.  Taking medicines such as aspirin and ibuprofen. These medicines can thin your blood. Do not take these medicines unless your health care provider tells you to take them.  Taking over-the-counter medicines, vitamins, herbs, and supplements. Tests You may have exams or tests, including:  Physical exam.  Blood tests.  Urine tests.  Electrocardiogram (ECG). This test measures the electrical activity of the heart. General instructions  Plan to have someone take you home from the hospital or clinic.  Ask your health care provider how your surgical site will be marked or identified.  Ask your health care provider what steps will be taken to help prevent infection. These may include: ? Washing skin with a germ-killing soap. ? Taking antibiotic medicine. What happens during the procedure?  An IV will be inserted into one of your veins.  You will be given one or more of the following: ? A medicine to help you relax (sedative). ? A medicine to make you fall asleep (general anesthetic). ? A medicine that is injected into your spine to numb the area below and slightly above the injection site (spinal anesthetic).  Your legs will be   placed in foot rests (stirrups) so that your legs are apart and your knees are bent.  The resectoscope will be passed through your urethra and into your bladder.  The part of your bladder that is affected by the tumor will be resected using the cutting edge of the resectoscope.  The  resectoscope will be removed.  A thin, flexible tube (catheter) will be passed through your urethra and into your bladder. The catheter will drain urine into a bag outside of your body. ? Fluid may be passed through the catheter to keep the catheter open. The procedure may vary among health care providers and hospitals. What happens after the procedure?  Your blood pressure, heart rate, breathing rate, and blood oxygen level will be monitored until you leave the hospital or clinic.  You may continue to receive fluids and medicines through an IV.  You will have some pain. You will be given pain medicine to relieve pain.  You will have a catheter to drain your urine. ? You will have blood in your urine. Your catheter may be kept in until your urine is clear. ? The amount of urine will be monitored. If necessary, your bladder may be rinsed out (irrigated) by passing fluid through your catheter.  You will be encouraged to walk around as soon as possible.  You may have to wear compression stockings. These stockings help to prevent blood clots and reduce swelling in your legs.  Do not drive for 24 hours if you were given a sedative during your procedure. Summary  Transurethral resection of a bladder tumor is the removal (resection) of a cancerous growth (tumor) on the inside wall of the bladder.  To do this procedure, your health care provider uses a thin telescope with a light, a tiny camera, and an electric cutting edge (resectoscope).  Follow your health care provider's instructions. You may need to stop or change certain medicines, and you may be told to stop eating and drinking several hours before the procedure.  Your blood pressure, heart rate, breathing rate, and blood oxygen level will be monitored until you leave the hospital or clinic.  You may have to wear compression stockings. These stockings help to prevent blood clots and reduce swelling in your legs. This information is  not intended to replace advice given to you by your health care provider. Make sure you discuss any questions you have with your health care provider. Document Released: 05/26/2009 Document Revised: 02/28/2018 Document Reviewed: 02/28/2018 Elsevier Patient Education  [place].   Bladder Cancer  Bladder cancer is an abnormal growth of tissue in the bladder. The bladder is the balloon-like sac in the pelvis. It collects and stores urine that comes from the kidneys through the ureters. The bladder wall is made of layers. If cancer spreads into these layers and through the wall of the bladder, it becomes more difficult to treat. What are the causes? The cause of this condition is not known. What increases the risk? The following factors may make you more likely to develop this condition:  Smoking.  Workplace risks (occupational exposures), such as rubber, leather, textile, dyes, chemicals, and paint.  Being white.  Your age. Most people with bladder cancer are over the age of 92.  Being female.  Having chronic bladder inflammation.  Having a personal history of bladder cancer.  Having a family history of bladder cancer (heredity).  Having had chemotherapy or radiation therapy to the pelvis.  Having been exposed to arsenic. What  are the signs or symptoms? Initial symptoms of this condition include:  Blood in the urine.  Painful urination.  Frequent bladder or urine infections.  Increase in urgency and frequency of urination. Advanced symptoms of this condition include:  Not being able to urinate.  Low back pain on one side.  Loss of appetite.  Weight loss.  Fatigue.  Swelling in the feet.  Bone pain. How is this diagnosed? This condition is diagnosed based on your medical history, a physical exam, urine tests, lab tests, imaging tests, and your symptoms. You may also have other tests or procedures done, such as:  A narrow tube being inserted into your  bladder through your urethra (cystoscopy) in order to view the lining of your bladder for tumors.  A biopsy to sample the tumor to see if cancer is present. If cancer is present, it will then be staged to determine its severity and extent. Staging is an assessment of:  The size of the tumor.  Whether the cancer has spread.  Where the cancer has spread. It is important to know how deeply into the bladder wall cancer has grown and whether cancer has spread to any other parts of your body. Staging may require blood tests or imaging tests, such as a CT scan, MRI, bone scan, or chest X-ray. How is this treated? Based on the stage of cancer, one treatment or a combination of treatments may be recommended. The most common forms of treatment are:  Surgery to remove the cancer. Procedures that may be done include transurethral resection and cystectomy.  Radiation therapy. This is high-energy X-rays or other particles. This is often used in combination with chemotherapy.  Chemotherapy. During this treatment, medicines are used to kill cancer cells.  Immunotherapy. This uses medicines to help your own immune system destroy cancer cells. Follow these instructions at home:  Take over-the-counter and prescription medicines only as told by your health care provider.  Maintain a healthy diet. Some of your treatments might affect your appetite.  Consider joining a support group. This may help you learn to cope with the stress of having bladder cancer.  Tell your cancer care team if you develop side effects. They may be able to recommend ways to relieve them.  Keep all follow-up visits as told by your health care provider. This is important. Where to find more information  American Cancer Society: www.cancer.[place] www.cancer.gov Contact a health care provider if:  You have symptoms of a urinary tract infection. These include: ? Fever. ? Chills. ? Weakness. ?  Muscle aches. ? Abdominal pain. ? Frequent and intense urge to urinate. ? Burning feeling in the bladder or urethra during urination. Get help right away if:  There is blood in your urine.  You cannot urinate.  You have severe pain or other symptoms that do not go away. Summary  Bladder cancer is an abnormal growth of tissue in the bladder.  This condition is diagnosed based on your medical history, a physical exam, urine tests, lab tests, imaging tests, and your symptoms.  Based on the stage of cancer, surgery, chemotherapy, or a combination of treatments may be recommended.  Consider joining a support group. This may help you learn to cope with the stress of having bladder cancer. This information is not intended to replace advice given to you by your health care provider. Make sure you discuss any questions you have with your health care provider. Document Released: 08/02/2003 Document Revised: 07/12/2017  Document Reviewed: 07/03/2016 Elsevier Patient Education  [employer].

## 2019-05-18 ENCOUNTER — Other Ambulatory Visit: Payer: Self-pay

## 2019-05-18 ENCOUNTER — Encounter
Admission: RE | Admit: 2019-05-18 | Discharge: 2019-05-18 | Disposition: A | Discharge status: Home / Self Care | Payer: Medicare HMO | Source: Ambulatory Visit | Attending: Urology | Admitting: Urology

## 2019-05-18 DIAGNOSIS — R000 Tachycardia, unspecified: Secondary | ICD-10-CM | POA: Insufficient documentation

## 2019-05-18 DIAGNOSIS — I452 Bifascicular block: Secondary | ICD-10-CM | POA: Insufficient documentation

## 2019-05-18 DIAGNOSIS — I444 Left anterior fascicular block: Secondary | ICD-10-CM | POA: Insufficient documentation

## 2019-05-18 DIAGNOSIS — I4510 Unspecified right bundle-branch block: Secondary | ICD-10-CM | POA: Diagnosis not present

## 2019-05-18 DIAGNOSIS — Z01810 Encounter for preprocedural cardiovascular examination: Secondary | ICD-10-CM | POA: Diagnosis not present

## 2019-05-18 HISTORY — DX: Eructation: R14.2

## 2019-05-18 HISTORY — DX: Hypoxemia: R09.02

## 2019-05-18 HISTORY — DX: Cardiac arrhythmia, unspecified: I49.9

## 2019-05-18 HISTORY — DX: Dyspnea, unspecified: R06.00

## 2019-05-18 NOTE — Patient Instructions (Addendum)
Your procedure is scheduled on: 05/22/2019 Fri  Report to Same Day Surgery 2nd floor medical mall [facility] Entrance-take elevator on left to 2nd floor.  Check in with surgery information desk.) To find out your arrival time please call [phone] between 1PM - 3PM on 05/21/2019 Thurs  Remember: Instructions that are not followed completely may result in serious medical risk, up to and including death, or upon the discretion of your surgeon and anesthesiologist your surgery may need to be rescheduled.    _x___ 1. Do not eat food after midnight the night before your procedure. You may drink clear liquids up to 2 hours before you are scheduled to arrive at the hospital for your procedure.  Do not drink clear liquids within 2 hours of your scheduled arrival to the hospital.  Clear liquids include  --Water or Apple juice without pulp  --Clear carbohydrate beverage such as ClearFast or Gatorade  --Black Coffee or Clear Tea (No milk, no creamers, do not add anything to                  the coffee or Tea Type 1 and type 2 diabetics should only drink water.   ____Ensure clear carbohydrate drink on the way to the hospital for bariatric patients  ____Ensure clear carbohydrate drink 3 hours before surgery.   No gum chewing or hard candies.     __x__ 2. No Alcohol for 24 hours before or after surgery.   __x__3. No Smoking or e-cigarettes for 24 prior to surgery.  Do not use any chewable tobacco products for at least 6 hour prior to surgery   ____  4. Bring all medications with you on the day of surgery if instructed.    __x__ 5. Notify your doctor if there is any change in your medical condition     (cold, fever, infections).    x___6. On the morning of surgery brush your teeth with toothpaste and water.  You may rinse your mouth with mouth wash if you wish.  Do not swallow any toothpaste or mouthwash.   Do not wear jewelry, make-up, hairpins, clips or nail polish.  Do not wear lotions,  powders, or perfumes. You may wear deodorant.  Do not shave 48 hours prior to surgery. Men may shave face and neck.  Do not bring valuables to the hospital.    [facility] is not responsible for any belongings or valuables.               Contacts, dentures or bridgework may not be worn into surgery.  Leave your suitcase in the car. After surgery it may be brought to your room.  For patients admitted to the hospital, discharge time is determined by your                       treatment team.  _  Patients discharged the day of surgery will not be allowed to drive home.  You will need someone to drive you home and stay with you the night of your procedure.    Please read over the following fact sheets that you were given:   [facility] Preparing for Surgery and or MRSA Information   _x___ Take anti-hypertensive listed below, cardiac, seizure, asthma,     anti-reflux and psychiatric medicines. These include:  1. albuterol (PROVENTIL HFA;VENTOLIN HFA) 108 (90 Base) MCG/ACT inhaler  2.STIOLTO RESPIMAT 2.5-2.5 MCG/ACT AERS  3.verapamil (VERELAN PM) 360 MG 24 hr capsule  4.  5.  6.  ____Fleets enema or Magnesium Citrate as directed.   _x___ Use CHG Soap or sage wipes as directed on instruction sheet   ____ Use inhalers on the day of surgery and bring to hospital day of surgery  ____ Stop Metformin and Janumet 2 days prior to surgery.    ____ Take 1/2 of usual insulin dose the night before surgery and none on the morning     surgery.   _x___ Follow recommendations from Cardiologist, Pulmonologist or PCP regarding          stopping Aspirin, Coumadin, Plavix ,Eliquis, Effient, or Pradaxa, and Pletal.  X____Stop Anti-inflammatories such as Advil, Aleve, Ibuprofen, Motrin, Naproxen, Naprosyn, Goodies powders or aspirin products. OK to take Tylenol and                          Celebrex.   _x___ Stop supplements until after surgery.  But may continue Vitamin D, Vitamin B,       and multivitamin.   Stop vitamin E today.   ____ Bring C-Pap to the hospital.

## 2019-05-18 NOTE — Pre-Procedure Instructions (Signed)
Called anesthesia regarding patient's medical history.  Pulmonary clearance requested. Dr Doristine Counter office and Dr. Zoila Shutter office notified by phone and fax.

## 2019-05-19 ENCOUNTER — Other Ambulatory Visit
Admission: RE | Admit: 2019-05-19 | Discharge: 2019-05-19 | Disposition: A | Discharge status: Home / Self Care | Payer: Medicare HMO | Source: Ambulatory Visit | Attending: Urology | Admitting: Urology

## 2019-05-19 ENCOUNTER — Other Ambulatory Visit: Payer: Self-pay

## 2019-05-19 DIAGNOSIS — Z20828 Contact with and (suspected) exposure to other viral communicable diseases: Secondary | ICD-10-CM | POA: Insufficient documentation

## 2019-05-19 DIAGNOSIS — Z01812 Encounter for preprocedural laboratory examination: Secondary | ICD-10-CM | POA: Insufficient documentation

## 2019-05-19 LAB — SARS CORONAVIRUS 2 (TAT 6-24 HRS): SARS Coronavirus 2: NEGATIVE

## 2019-05-20 ENCOUNTER — Telehealth: Payer: Self-pay | Admitting: Internal Medicine

## 2019-05-20 NOTE — Telephone Encounter (Signed)
Surgical clearance has been completed and faxed back to PAT.

## 2019-05-20 NOTE — Telephone Encounter (Signed)
Surgical clearance form is currently in DK's folder for review.  Left message for [place].

## 2019-05-20 NOTE — Pre-Procedure Instructions (Signed)
Phone call to Dr. Mortimer Fries office to find out about surgery clearance. Message put in for the nurse to address and return my call.

## 2019-05-21 MED ORDER — CEFAZOLIN SODIUM-DEXTROSE 2-4 GM/100ML-% IV SOLN
2.0000 g | INTRAVENOUS | Status: AC
  Administered 2019-05-22: 2 g via INTRAVENOUS

## 2019-05-22 ENCOUNTER — Encounter: Payer: Self-pay | Admitting: *Deleted

## 2019-05-22 ENCOUNTER — Other Ambulatory Visit: Payer: Self-pay

## 2019-05-22 ENCOUNTER — Ambulatory Visit
Admission: RE | Admit: 2019-05-22 | Discharge: 2019-05-22 | Disposition: A | Discharge status: Home / Self Care | Payer: Medicare HMO | Attending: Urology | Admitting: Urology

## 2019-05-22 ENCOUNTER — Encounter
Admission: RE | Disposition: A | Discharge status: Home / Self Care | Payer: Self-pay | Source: Home / Self Care | Attending: Urology

## 2019-05-22 ENCOUNTER — Ambulatory Visit: Payer: Medicare HMO | Admitting: Certified Registered"

## 2019-05-22 DIAGNOSIS — M810 Age-related osteoporosis without current pathological fracture: Secondary | ICD-10-CM | POA: Insufficient documentation

## 2019-05-22 DIAGNOSIS — H3530 Unspecified macular degeneration: Secondary | ICD-10-CM | POA: Insufficient documentation

## 2019-05-22 DIAGNOSIS — R310 Gross hematuria: Secondary | ICD-10-CM | POA: Insufficient documentation

## 2019-05-22 DIAGNOSIS — K279 Peptic ulcer, site unspecified, unspecified as acute or chronic, without hemorrhage or perforation: Secondary | ICD-10-CM | POA: Diagnosis not present

## 2019-05-22 DIAGNOSIS — N1330 Unspecified hydronephrosis: Secondary | ICD-10-CM | POA: Diagnosis not present

## 2019-05-22 DIAGNOSIS — I10 Essential (primary) hypertension: Secondary | ICD-10-CM | POA: Insufficient documentation

## 2019-05-22 DIAGNOSIS — E7800 Pure hypercholesterolemia, unspecified: Secondary | ICD-10-CM | POA: Diagnosis not present

## 2019-05-22 DIAGNOSIS — C670 Malignant neoplasm of trigone of bladder: Secondary | ICD-10-CM | POA: Diagnosis not present

## 2019-05-22 DIAGNOSIS — G709 Myoneural disorder, unspecified: Secondary | ICD-10-CM | POA: Insufficient documentation

## 2019-05-22 DIAGNOSIS — D494 Neoplasm of unspecified behavior of bladder: Secondary | ICD-10-CM

## 2019-05-22 DIAGNOSIS — I739 Peripheral vascular disease, unspecified: Secondary | ICD-10-CM | POA: Insufficient documentation

## 2019-05-22 DIAGNOSIS — J449 Chronic obstructive pulmonary disease, unspecified: Secondary | ICD-10-CM | POA: Diagnosis not present

## 2019-05-22 DIAGNOSIS — Z9981 Dependence on supplemental oxygen: Secondary | ICD-10-CM | POA: Diagnosis not present

## 2019-05-22 DIAGNOSIS — Z87891 Personal history of nicotine dependence: Secondary | ICD-10-CM | POA: Diagnosis not present

## 2019-05-22 DIAGNOSIS — H409 Unspecified glaucoma: Secondary | ICD-10-CM | POA: Diagnosis not present

## 2019-05-22 DIAGNOSIS — C678 Malignant neoplasm of overlapping sites of bladder: Secondary | ICD-10-CM | POA: Insufficient documentation

## 2019-05-22 DIAGNOSIS — C679 Malignant neoplasm of bladder, unspecified: Secondary | ICD-10-CM | POA: Diagnosis present

## 2019-05-22 HISTORY — PX: TRANSURETHRAL RESECTION OF BLADDER TUMOR: SHX2575

## 2019-05-22 HISTORY — PX: CYSTOSCOPY WITH STENT PLACEMENT: SHX5790

## 2019-05-22 SURGERY — TURBT (TRANSURETHRAL RESECTION OF BLADDER TUMOR)
Anesthesia: General | Site: Ureter

## 2019-05-22 MED ORDER — FENTANYL CITRATE (PF) 100 MCG/2ML IJ SOLN: 25.0000 ug | INTRAMUSCULAR | Status: DC | PRN

## 2019-05-22 MED ORDER — FENTANYL CITRATE (PF) 100 MCG/2ML IJ SOLN
INTRAMUSCULAR | Status: AC
  Filled 2019-05-22: qty 2

## 2019-05-22 MED ORDER — ONDANSETRON HCL 4 MG/2ML IJ SOLN: 4.0000 mg | Freq: Once | INTRAMUSCULAR | Status: DC | PRN

## 2019-05-22 MED ORDER — ROCURONIUM BROMIDE 100 MG/10ML IV SOLN
INTRAVENOUS | Status: DC | PRN
  Administered 2019-05-22 (×2): 20 mg via INTRAVENOUS
  Administered 2019-05-22: 10 mg via INTRAVENOUS

## 2019-05-22 MED ORDER — SUGAMMADEX SODIUM 200 MG/2ML IV SOLN
INTRAVENOUS | Status: DC | PRN
  Administered 2019-05-22: 100 mg via INTRAVENOUS

## 2019-05-22 MED ORDER — FAMOTIDINE 20 MG PO TABS
ORAL_TABLET | ORAL | Status: AC
  Administered 2019-05-22: 20 mg via ORAL
  Filled 2019-05-22: qty 1

## 2019-05-22 MED ORDER — BELLADONNA ALKALOIDS-OPIUM 16.2-60 MG RE SUPP
RECTAL | Status: AC
  Filled 2019-05-22: qty 1

## 2019-05-22 MED ORDER — LABETALOL HCL 5 MG/ML IV SOLN
INTRAVENOUS | Status: DC | PRN
  Administered 2019-05-22: 5 mg via INTRAVENOUS

## 2019-05-22 MED ORDER — ONDANSETRON HCL 4 MG/2ML IJ SOLN
INTRAMUSCULAR | Status: DC | PRN
  Administered 2019-05-22: 4 mg via INTRAVENOUS

## 2019-05-22 MED ORDER — METHYLPREDNISOLONE SODIUM SUCC 125 MG IJ SOLR
INTRAMUSCULAR | Status: DC | PRN
  Administered 2019-05-22: 125 mg via INTRAVENOUS

## 2019-05-22 MED ORDER — PROPOFOL 10 MG/ML IV BOLUS
INTRAVENOUS | Status: AC
  Filled 2019-05-22: qty 20

## 2019-05-22 MED ORDER — LIDOCAINE HCL (CARDIAC) PF 100 MG/5ML IV SOSY
PREFILLED_SYRINGE | INTRAVENOUS | Status: DC | PRN
  Administered 2019-05-22: 50 mg via INTRAVENOUS

## 2019-05-22 MED ORDER — BELLADONNA ALKALOIDS-OPIUM 16.2-60 MG RE SUPP
RECTAL | Status: DC | PRN
  Administered 2019-05-22: 1 via RECTAL

## 2019-05-22 MED ORDER — PROPOFOL 10 MG/ML IV BOLUS
INTRAVENOUS | Status: DC | PRN
  Administered 2019-05-22: 70 mg via INTRAVENOUS

## 2019-05-22 MED ORDER — IOHEXOL 180 MG/ML  SOLN
INTRAMUSCULAR | Status: DC | PRN
  Administered 2019-05-22: 19:00:00 10 mL

## 2019-05-22 MED ORDER — TRAMADOL HCL 50 MG PO TABS: 50.0000 mg | ORAL_TABLET | Freq: Four times a day (QID) | ORAL | 0 refills | Status: AC | PRN

## 2019-05-22 MED ORDER — FAMOTIDINE 20 MG PO TABS
20.0000 mg | ORAL_TABLET | Freq: Once | ORAL | Status: AC
  Administered 2019-05-22: 15:00:00 20 mg via ORAL

## 2019-05-22 MED ORDER — CEFAZOLIN SODIUM-DEXTROSE 2-4 GM/100ML-% IV SOLN
INTRAVENOUS | Status: AC
  Filled 2019-05-22: qty 100

## 2019-05-22 MED ORDER — FENTANYL CITRATE (PF) 100 MCG/2ML IJ SOLN
INTRAMUSCULAR | Status: DC | PRN
  Administered 2019-05-22 (×4): 50 ug via INTRAVENOUS

## 2019-05-22 MED ORDER — LACTATED RINGERS IV SOLN
INTRAVENOUS | Status: DC
  Administered 2019-05-22 (×2): via INTRAVENOUS

## 2019-05-22 MED ORDER — EPHEDRINE SULFATE 50 MG/ML IJ SOLN
INTRAMUSCULAR | Status: DC | PRN
  Administered 2019-05-22: 10 mg via INTRAVENOUS

## 2019-05-22 MED ORDER — SULFAMETHOXAZOLE-TRIMETHOPRIM 800-160 MG PO TABS: 1.0000 | ORAL_TABLET | Freq: Every day | ORAL | 0 refills | Status: DC

## 2019-05-22 SURGICAL SUPPLY — 42 items
ADAPTER IRRIG TUBE 2 SPIKE SOL (ADAPTER) ×4 IMPLANT
BAG DRAIN CYSTO-URO LG1000N (MISCELLANEOUS) ×4 IMPLANT
BAG URINE DRAINAGE (UROLOGICAL SUPPLIES) ×8 IMPLANT
BRUSH SCRUB EZ  4% CHG (MISCELLANEOUS) ×2
BRUSH SCRUB EZ 4% CHG (MISCELLANEOUS) ×2 IMPLANT
CATH FOL 2WAY LX 18X30 (CATHETERS) ×4 IMPLANT
CATH FOLEY 2WAY SIL 20X30 (CATHETERS) ×4 IMPLANT
CATH URETL 5X70 OPEN END (CATHETERS) ×4 IMPLANT
CONRAY 43 FOR UROLOGY 50M (MISCELLANEOUS) IMPLANT
CRADLE LAMINECT ARM (MISCELLANEOUS) ×4 IMPLANT
DRAPE UTILITY 15X26 TOWEL STRL (DRAPES) ×4 IMPLANT
DRSG TELFA 4X3 1S NADH ST (GAUZE/BANDAGES/DRESSINGS) IMPLANT
ELECT BIVAP BIPO 22/24 DONUT (ELECTROSURGICAL) ×4
ELECT LOOP 22F BIPOLAR SML (ELECTROSURGICAL)
ELECT REM PT RETURN 9FT ADLT (ELECTROSURGICAL)
ELECTRD BIVAP BIPO 22/24 DONUT (ELECTROSURGICAL) ×2 IMPLANT
ELECTRODE LOOP 22F BIPOLAR SML (ELECTROSURGICAL) IMPLANT
ELECTRODE REM PT RTRN 9FT ADLT (ELECTROSURGICAL) IMPLANT
GLOVE BIOGEL PI IND STRL 7.5 (GLOVE) ×2 IMPLANT
GLOVE BIOGEL PI INDICATOR 7.5 (GLOVE) ×2
GLOVE INDICATOR 7.5 STRL GRN (GLOVE) ×4 IMPLANT
GOWN STRL REUS W/ TWL LRG LVL3 (GOWN DISPOSABLE) ×2 IMPLANT
GOWN STRL REUS W/ TWL XL LVL3 (GOWN DISPOSABLE) ×2 IMPLANT
GOWN STRL REUS W/TWL LRG LVL3 (GOWN DISPOSABLE) ×2
GOWN STRL REUS W/TWL XL LVL3 (GOWN DISPOSABLE) ×2
GUIDEWIRE GREEN .038 145CM (MISCELLANEOUS) ×4 IMPLANT
GUIDEWIRE STR DUAL SENSOR (WIRE) ×4 IMPLANT
KIT TURNOVER CYSTO (KITS) ×4 IMPLANT
LOOP CUT BIPOLAR 24F LRG (ELECTROSURGICAL) ×4 IMPLANT
NDL SAFETY ECLIPSE 18X1.5 (NEEDLE) ×2 IMPLANT
NEEDLE HYPO 18GX1.5 SHARP (NEEDLE) ×2
PACK CYSTO AR (MISCELLANEOUS) ×4 IMPLANT
SET CYSTO W/LG BORE CLAMP LF (SET/KITS/TRAYS/PACK) ×4 IMPLANT
SET IRRIG Y TYPE TUR BLADDER L (SET/KITS/TRAYS/PACK) ×4 IMPLANT
SOL .9 NS 3000ML IRR  AL (IV SOLUTION) ×22
SOL .9 NS 3000ML IRR UROMATIC (IV SOLUTION) ×22 IMPLANT
STENT URET 6FRX24 CONTOUR (STENTS) IMPLANT
STENT URET 6FRX26 CONTOUR (STENTS) IMPLANT
STENT URO INLAY 6FRX24CM (STENTS) ×4 IMPLANT
SURGILUBE 2OZ TUBE FLIPTOP (MISCELLANEOUS) ×4 IMPLANT
SYRINGE IRR TOOMEY STRL 70CC (SYRINGE) ×8 IMPLANT
WATER STERILE IRR 1000ML POUR (IV SOLUTION) ×4 IMPLANT

## 2019-05-22 NOTE — Anesthesia Preprocedure Evaluation (Signed)
Anesthesia Evaluation  Patient identified by MRN, date of birth, ID band Patient awake    Reviewed: Allergy & Precautions, NPO status , Patient's Chart, lab work & pertinent test results  Airway Mallampati: III       Dental  (+) Partial Lower, Partial Upper, Chipped   Pulmonary shortness of breath, COPD,  COPD inhaler and oxygen dependent, former smoker,    Pulmonary exam normal        Cardiovascular hypertension, + Peripheral Vascular Disease  Normal cardiovascular exam     Neuro/Psych  Neuromuscular disease negative psych ROS   GI/Hepatic Neg liver ROS, PUD,   Endo/Other  negative endocrine ROS  Renal/GU      Musculoskeletal   Abdominal Normal abdominal exam  (+)   Peds  Hematology negative hematology ROS (+)   Anesthesia Other Findings Past Medical History: No date: Burping No date: Chronic airway obstruction, not elsewhere classified No date: Dyspnea No date: Dysrhythmia No date: Macular degeneration (senile) of retina, unspecified No date: Obstructive chronic bronchitis with exacerbation (HCC) No date: Osteoporosis, unspecified No date: Oxygen deficiency No date: Personal history of peptic ulcer disease No date: Tobacco use disorder No date: Unspecified essential hypertension No date: Unspecified glaucoma(365.9)  Reproductive/Obstetrics                             Anesthesia Physical Anesthesia Plan  ASA: IV  Anesthesia Plan: General   Post-op Pain Management:    Induction: Intravenous  PONV Risk Score and Plan:   Airway Management Planned: Oral ETT  Additional Equipment:   Intra-op Plan:   Post-operative Plan: Extubation in OR and Possible Post-op intubation/ventilation  Informed Consent: I have reviewed the patients History and Physical, chart, labs and discussed the procedure including the risks, benefits and alternatives for the proposed anesthesia with the  patient or authorized representative who has indicated his/her understanding and acceptance.     Dental advisory given  Plan Discussed with: CRNA and Surgeon  Anesthesia Plan Comments:         Anesthesia Quick Evaluation

## 2019-05-22 NOTE — Transfer of Care (Signed)
Immediate Anesthesia Transfer of Care Note  Patient: Misty Ferguson  Procedure(s) Performed: TRANSURETHRAL RESECTION OF BLADDER TUMOR (TURBT) (N/A Ureter) CYSTOSCOPY WITH STENT PLACEMENT (Left Ureter)  Patient Location: PACU  Anesthesia Type:General  Level of Consciousness: awake, alert  and oriented  Airway & Oxygen Therapy: Patient Spontanous Breathing and Patient connected to nasal cannula oxygen  Post-op Assessment: Report given to RN and Post -op Vital signs reviewed and stable  Post vital signs: Reviewed and stable  Last Vitals:  Vitals Value Taken Time  BP    Temp    Pulse    Resp    SpO2      Last Pain:  Vitals:   05/22/19 1414  TempSrc: Tympanic  PainSc: 0-No pain         Complications: No apparent anesthesia complications

## 2019-05-22 NOTE — Anesthesia Procedure Notes (Signed)
Procedure Name: Intubation Date/Time: 05/22/2019 4:45 PM Performed by: Chanetta Marshall, CRNA Pre-anesthesia Checklist: Patient identified, Emergency Drugs available, Suction available and Patient being monitored Patient Re-evaluated:Patient Re-evaluated prior to induction Oxygen Delivery Method: Circle system utilized Preoxygenation: Pre-oxygenation with 100% oxygen Induction Type: IV induction Ventilation: Mask ventilation without difficulty Laryngoscope Size: McGraph and 3 Grade View: Grade II Tube type: Oral Tube size: 7.0 mm Number of attempts: 1 Airway Equipment and Method: Stylet,  Oral airway and Video-laryngoscopy Placement Confirmation: ETT inserted through vocal cords under direct vision,  positive ETCO2,  breath sounds checked- equal and bilateral and CO2 detector Secured at: 21 cm Tube secured with: Tape Dental Injury: Teeth and Oropharynx as per pre-operative assessment

## 2019-05-22 NOTE — Op Note (Signed)
Date of procedure: 05/22/19  Preoperative diagnosis:  1. Bladder tumor, left hydronephrosis  Postoperative diagnosis:  1. Same  Procedure: 1. TURBT medium ~4cm 2. Left retrograde pyelogram with intraoperative interpretation, left ureteral stent placement  Surgeon: Nickolas Madrid, MD  Anesthesia: General  Complications: None  Intraoperative findings:  1.  4 cm bladder tumor over the left ureteral orifice and left trigone, resected down to muscle 2.  Able to identify left ureteral orifice and retrograde pyelogram showed dilated ureter and collecting system 3.  Left 6 French by 24 cm Bard Optima stent placed  EBL: 20 cc  Specimens: Bladder tumor for permanent  Drains: Left 6 French by 24 cm Bard Optima stent  Indication: SHRONDA OLDFATHER is a 85 y.o. patient with gross hematuria who was found to have a 4 cm bladder tumor at the left trigone on CT urogram with left hydronephrosis and obstruction.  After reviewing the management options for treatment, they elected to proceed with the above surgical procedure(s). We have discussed the potential benefits and risks of the procedure, side effects of the proposed treatment, the likelihood of the patient achieving the goals of the procedure, and any potential problems that might occur during the procedure or recuperation. Informed consent has been obtained.  Description of procedure:  The patient was taken to the operating room and general anesthesia was induced. SCDs were placed for DVT prophylaxis.The patient was placed in the dorsal lithotomy position, prepped and draped in the usual sterile fashion, and preoperative antibiotics(Ancef) were administered. A preoperative time-out was performed.   A 21 French rigid cystoscope was used to intubate the urethra.  Thorough cystoscopy was performed and showed a 4 cm irregular appearing bladder tumor at the left trigone overlying the left ureteral orifice.  There were no other tumors in the bladder.   The resecting loop was used to methodically resect the tumor down to muscle.  At no point was there concern for bladder perforation.  The tumor appeared muscle invasive.  Fortunately, halfway through the resection I was able to identify what appeared to be the left ureter, and I was able to cannulate this with a sensor wire and 5 [place] access catheter.  Retrograde pyelogram showed a dilated left ureter and collecting system with a kink proximally.  With moderate difficulty I was able to advance a sensor wire into the upper pole.  A 6 French by 24 cm Bard Optima stent was uneventfully placed over the wire with an excellent curl in the upper pole, as well as under direct vision in the bladder.  The loop was then used to resect all remaining abnormal appearing tissue down to the bladder wall.  The bipolar button was used to achieve meticulous hemostasis, and there is no bleeding noted in a decompressed state.  A 20 [place] two-way catheter was placed with return of clear fluid.  10 cc were placed in the balloon.  The catheter irrigated easily and remained clear to faint pink.  A belladonna suppository was placed.  Disposition: Stable to PACU  Plan: Follow-up pathology Remove Foley in 5 days Patient would like to pursue tri-modal therapy if muscle invasive disease  Nickolas Madrid, MD

## 2019-05-22 NOTE — Anesthesia Post-op Follow-up Note (Signed)
Anesthesia QCDR form completed.        

## 2019-05-22 NOTE — Discharge Instructions (Signed)
Indwelling Urinary Catheter Care, Adult °An indwelling urinary catheter is a thin tube that is put into your bladder. The tube helps to drain pee (urine) out of your body. The tube goes in through your urethra. Your urethra is where pee comes out of your body. Your pee will come out through the catheter, then it will go into a bag (drainage bag). °Take good care of your catheter so it will work well. °How to wear your catheter and bag °Supplies needed °· Sticky tape (adhesive tape) or a leg strap. °· Alcohol wipe or soap and water (if you use tape). °· A clean towel (if you use tape). °· Large overnight bag. °· Smaller bag (leg bag). °Wearing your catheter °Attach your catheter to your leg with tape or a leg strap. °· Make sure the catheter is not pulled tight. °· If a leg strap gets wet, take it off and put on a dry strap. °· If you use tape to hold the bag on your leg: °1. Use an alcohol wipe or soap and water to wash your skin where the tape made it sticky before. °2. Use a clean towel to pat-dry that skin. °3. Use new tape to make the bag stay on your leg. °Wearing your bags °You should have been given a large overnight bag. °· You may wear the overnight bag in the day or night. °· Always have the overnight bag lower than your bladder.  Do not let the bag touch the floor. °· Before you go to sleep, put a clean plastic bag in a wastebasket. Then hang the overnight bag inside the wastebasket. °You should also have a smaller leg bag that fits under your clothes. °· Always wear the leg bag below your knee. °· Do not wear your leg bag at night. °How to care for your skin and catheter °Supplies needed °· A clean washcloth. °· Water and mild soap. °· A clean towel. °Caring for your skin and catheter ° °  ° °· Clean the skin around your catheter every day: °? Wash your hands with soap and water. °? Wet a clean washcloth in warm water and mild soap. °? Clean the skin around your urethra. °? If you are female: °? Gently  spread the folds of skin around your vagina (labia). °? With the washcloth in your other hand, wipe the inner side of your labia on each side. Wipe from front to back. °? If you are female: °? Pull back any skin that covers the end of your penis (foreskin). °? With the washcloth in your other hand, wipe your penis in small circles. Start wiping at the tip of your penis, then move away from the catheter. °? Move the foreskin back in place, if needed. °? With your free hand, hold the catheter close to where it goes into your body. °? Keep holding the catheter during cleaning so it does not get pulled out. °? With the washcloth in your other hand, clean the catheter. °? Only wipe downward on the catheter. °? Do not wipe upward toward your body. Doing this may push germs into your urethra and cause infection. °? Use a clean towel to pat-dry the catheter and the skin around it. Make sure to wipe off all soap. °? Wash your hands with soap and water. °· Shower every day. Do not take baths. °· Do not use cream, ointment, or lotion on the area where the catheter goes into your body, unless your doctor tells you   to. °· Do not use powders, sprays, or lotions on your genital area. °· Check your skin around the catheter every day for signs of infection. Check for: °? Redness, swelling, or pain. °? Fluid or blood. °? Warmth. °? Pus or a bad smell. °How to empty the bag °Supplies needed °· Rubbing alcohol. °· Gauze pad or cotton ball. °· Tape or a leg strap. °Emptying the bag °Pour the pee out of your bag when it is ?-½ full, or at least 2-3 times a day. Do this for your overnight bag and your leg bag. °1. Wash your hands with soap and water. °2. Separate (detach) the bag from your leg. °3. Hold the bag over the toilet or a clean pail. Keep the bag lower than your hips and bladder. This is so the pee (urine) does not go back into the tube. °4. Open the pour spout. It is at the bottom of the bag. °5. Empty the pee into the toilet or  pail. Do not let the pour spout touch any surface. °6. Put rubbing alcohol on a gauze pad or cotton ball. °7. Use the gauze pad or cotton ball to clean the pour spout. °8. Close the pour spout. °9. Attach the bag to your leg with tape or a leg strap. °10. Wash your hands with soap and water. °Follow instructions for cleaning the drainage bag: °· From the product maker. °· As told by your doctor. °How to change the bag °Supplies needed °· Alcohol wipes. °· A clean bag. °· Tape or a leg strap. °Changing the bag °Replace your bag when it starts to leak, smell bad, or look dirty. °1. Wash your hands with soap and water. °2. Separate the dirty bag from your leg. °3. Pinch the catheter with your fingers so that pee does not spill out. °4. Separate the catheter tube from the bag tube where these tubes connect (at the connection valve). Do not let the tubes touch any surface. °5. Clean the end of the catheter tube with an alcohol wipe. Use a different alcohol wipe to clean the end of the bag tube. °6. Connect the catheter tube to the tube of the clean bag. °7. Attach the clean bag to your leg with tape or a leg strap. Do not make the bag tight on your leg. °8. Wash your hands with soap and water. °General rules ° °· Never pull on your catheter. Never try to take it out. Doing that can hurt you. °· Always wash your hands before and after you touch your catheter or bag. Use a mild, fragrance-free soap. If you do not have soap and water, use hand sanitizer. °· Always make sure there are no twists or bends (kinks) in the catheter tube. °· Always make sure there are no leaks in the catheter or bag. °· Drink enough fluid to keep your pee pale yellow. °· Do not take baths, swim, or use a hot tub. °· If you are female, wipe from front to back after you poop (have a bowel movement). °Contact a doctor if: °· Your pee is cloudy. °· Your pee smells worse than usual. °· Your catheter gets clogged. °· Your catheter leaks. °· Your bladder  feels full. °Get help right away if: °· You have redness, swelling, or pain where the catheter goes into your body. °· You have fluid, blood, pus, or a bad smell coming from the area where the catheter goes into your body. °· Your skin feels warm where   the catheter goes into your body. °· You have a fever. °· You have pain in your: °? Belly (abdomen). °? Legs. °? Lower back. °? Bladder. °· You see blood in the catheter. °· Your pee is pink or red. °· You feel sick to your stomach (nauseous). °· You throw up (vomit). °· You have chills. °· Your pee is not draining into the bag. °· Your catheter gets pulled out. °Summary °· An indwelling urinary catheter is a thin tube that is placed into the bladder to help drain pee (urine) out of the body. °· The catheter is placed into the part of the body that drains pee from the bladder (urethra). °· Taking good care of your catheter will keep it working properly and help prevent problems. °· Always wash your hands before and after touching your catheter or bag. °· Never pull on your catheter or try to take it out. °This information is not intended to replace advice given to you by your health care provider. Make sure you discuss any questions you have with your health care provider. °Document Released: 11/24/2012 Document Revised: 11/21/2018 Document Reviewed: 03/15/2017 °Elsevier Patient Education © 2020 [employer] ° °AMBULATORY SURGERY  °DISCHARGE INSTRUCTIONS ° ° °1) The drugs that you were given will stay in your system until tomorrow so for the next 24 hours you should not: ° °A) Drive an automobile °B) Make any legal decisions °C) Drink any alcoholic beverage ° ° °2) You may resume regular meals tomorrow.  Today it is better to start with liquids and gradually work up to solid foods. ° °You may eat anything you prefer, but it is better to start with liquids, then soup and crackers, and gradually work up to solid foods. ° ° °3) Please notify your doctor immediately if  you have any unusual bleeding, trouble breathing, redness and pain at the surgery site, drainage, fever, or pain not relieved by medication. ° ° ° °4) Additional Instructions: ° ° ° ° ° ° ° °Please contact your physician with any problems or Same Day Surgery at [phone], Monday through Friday 6 am to 4 pm, or [facility] [facility] Main number at [phone]. ° °

## 2019-05-22 NOTE — H&P (Signed)
UROLOGY H&P UPDATE  Agree with prior H&P dated 04/30/19.  Misty Ferguson is a an 85 year old female who presented with gross hematuria and on CT urogram was found to have a large 3 cm left posterior wall bladder tumor with obstruction and left hydronephrosis/  Cardiac: RRR Lungs: CTA bilaterally  Laterality: Left Procedure: TURBT, attempted left ureteral stent placement  Urine: 9/17 urine culture no growth  We discussed transurethral resection of bladder tumor (TURBT) and risks and benefits at length. This is typically a 1 to 2-hour procedure done under general anesthesia in the operating room.  A scope is inserted through the urethra and used to resect abnormal tissue within the bladder, which is then sent to the pathologist to determine grade and stage of the tumor.  We discussed attempt for left ureteral stent placement and possible need for left nephrostomy tube in the future if unable to identify the left ureteral orifice today.  Risks include bleeding, infection, need for temporary Foley placement, and bladder perforation.  Treatment strategies are based on the type of tumor and depth of invasion.  We discussed the high risk for muscle invasive disease with the obstruction of the left kidney.   Misty Co, MD 05/22/2019

## 2019-05-25 ENCOUNTER — Encounter: Payer: Self-pay | Admitting: Urology

## 2019-05-25 NOTE — Anesthesia Postprocedure Evaluation (Signed)
Anesthesia Post Note  Patient: Misty Ferguson  Procedure(s) Performed: TRANSURETHRAL RESECTION OF BLADDER TUMOR (TURBT) (N/A Ureter) CYSTOSCOPY WITH STENT PLACEMENT (Left Ureter)  Patient location during evaluation: PACU Anesthesia Type: General Level of consciousness: awake and alert and oriented Pain management: pain level controlled Vital Signs Assessment: post-procedure vital signs reviewed and stable Respiratory status: spontaneous breathing Cardiovascular status: blood pressure returned to baseline Anesthetic complications: no     Last Vitals:  Vitals:   05/22/19 2015 05/22/19 2020  BP: (!) 156/73   Pulse: 85 (!) 116  Resp: (!) 22 (!) 22  Temp:    SpO2: 98% 92%    Last Pain:  Vitals:   05/22/19 1955  TempSrc: Temporal  PainSc: 0-No pain                 Noemi Ishmael

## 2019-05-26 ENCOUNTER — Other Ambulatory Visit: Payer: Self-pay | Admitting: Urology

## 2019-05-26 ENCOUNTER — Other Ambulatory Visit: Payer: Self-pay | Admitting: Anatomic Pathology & Clinical Pathology

## 2019-05-26 ENCOUNTER — Telehealth: Payer: Self-pay | Admitting: Urology

## 2019-05-26 LAB — SURGICAL PATHOLOGY

## 2019-05-26 NOTE — Telephone Encounter (Signed)
Done

## 2019-05-26 NOTE — Telephone Encounter (Signed)
-----   Message from Billey Co, MD sent at 05/26/2019  2:19 PM EDT ----- Regarding: FOLEY REMOVAL Please set her up for nurse or PA visit this Wednesday or Thursday for foley removal, ok to overbook on my schedule if needed  Thanks Nickolas Madrid, MD 05/26/2019

## 2019-05-27 ENCOUNTER — Other Ambulatory Visit: Payer: Self-pay

## 2019-05-27 ENCOUNTER — Encounter: Payer: Self-pay | Admitting: Urology

## 2019-05-27 ENCOUNTER — Ambulatory Visit (INDEPENDENT_AMBULATORY_CARE_PROVIDER_SITE_OTHER): Payer: Medicare HMO | Admitting: Urology

## 2019-05-27 VITALS — BP 142/82 | HR 97 | Ht 61.0 in | Wt 102.0 lb

## 2019-05-27 DIAGNOSIS — R310 Gross hematuria: Secondary | ICD-10-CM

## 2019-05-27 DIAGNOSIS — C672 Malignant neoplasm of lateral wall of bladder: Secondary | ICD-10-CM

## 2019-05-27 NOTE — Progress Notes (Signed)
   05/27/2019 4:15 PM   Violett Primitivo Gauze 1933/12/22 [id number]  Reason for visit: Discuss TURBT pathology  HPI: I saw Ms. Seltzer and her husband back in urology clinic for follow-up today and Foley removal.  She is a very nice 85 year old female on oxygen at baseline for COPD, but otherwise healthy who presented in mid September with gross hematuria.  CT urogram showed a 3 cm left sided bladder mass with upstream hydroureteronephrosis.  She underwent  TURBT and left ureteral stent placement on 05/22/2019 with complete resection of all visible tumor.  She has been doing well since surgery and her Foley catheter is draining clear yellow urine.  Foley was removed in clinic today.  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  We had a very long conversation about her pathology today.  We discussed standard of care would be neoadjuvant chemotherapy and cystectomy with lymph node dissection and urinary diversion.  She is adamant that she does not want to undergo a big surgery or have a urinary diversion.  I also offered her referral to see a urologic oncologist at [facility] or in [place] to discuss cystectomy in even more detail, but she deferred.  We discussed the alternative would be tri-modal therapy with complete TURBT(already completed), radiation, and chemotherapy.  We discussed the need for close surveillance after treatment and high risk of recurrence.  Regarding staging, there was a nodular density over the left upper lobe on chest x-ray, and I checked a BMP today to see if her renal function is normalized with the stent in place in order to undergo CT chest with contrast to complete staging.  We are planning to discuss her in multidisciplinary tumor board on 06/04/2019, and I will see her the next week to further discuss treatment.  -We will discuss her case at tumor board 10/22 -Anticipate proceeding with tri-modal therapy with  chemotherapy and radiation for muscle invasive bladder cancer -Referral placed to radiation oncology and oncology -CT chest ordered to complete staging  A total of 25 minutes were spent face-to-face with the patient, greater than 50% was spent in patient education, counseling, and coordination of care regarding new diagnosis of muscle invasive bladder cancer and treatment strategies.  Billey Co, [facility] [address] [place], [place] [address] [phone]

## 2019-05-28 ENCOUNTER — Other Ambulatory Visit: Payer: Self-pay | Admitting: Pathology

## 2019-05-28 LAB — BASIC METABOLIC PANEL
BUN/Creatinine Ratio: 16 (ref 12–28)
BUN: 21 mg/dL (ref 8–27)
CO2: 21 mmol/L (ref 20–29)
Calcium: 9.9 mg/dL (ref 8.7–10.3)
Chloride: 99 mmol/L (ref 96–106)
Creatinine, Ser: 1.29 mg/dL — ABNORMAL HIGH (ref 0.57–1.00)
GFR calc Af Amer: 45 mL/min/{1.73_m2} — ABNORMAL LOW (ref >59)
GFR calc non Af Amer: 39 mL/min/{1.73_m2} — ABNORMAL LOW (ref >59)
Glucose: 119 mg/dL — ABNORMAL HIGH (ref 65–99)
Potassium: 4.4 mmol/L (ref 3.5–5.2)
Sodium: 138 mmol/L (ref 134–144)

## 2019-06-04 ENCOUNTER — Ambulatory Visit: Payer: Medicare HMO | Admitting: Urology

## 2019-06-04 ENCOUNTER — Other Ambulatory Visit: Payer: Self-pay | Admitting: Urology

## 2019-06-04 ENCOUNTER — Inpatient Hospital Stay: Payer: Medicare HMO | Attending: Urology

## 2019-06-04 DIAGNOSIS — Z7189 Other specified counseling: Secondary | ICD-10-CM | POA: Insufficient documentation

## 2019-06-04 DIAGNOSIS — N1832 Chronic kidney disease, stage 3b: Secondary | ICD-10-CM | POA: Insufficient documentation

## 2019-06-04 DIAGNOSIS — Z87891 Personal history of nicotine dependence: Secondary | ICD-10-CM | POA: Insufficient documentation

## 2019-06-04 DIAGNOSIS — C674 Malignant neoplasm of posterior wall of bladder: Secondary | ICD-10-CM | POA: Insufficient documentation

## 2019-06-04 DIAGNOSIS — J449 Chronic obstructive pulmonary disease, unspecified: Secondary | ICD-10-CM | POA: Insufficient documentation

## 2019-06-04 DIAGNOSIS — N1330 Unspecified hydronephrosis: Secondary | ICD-10-CM | POA: Insufficient documentation

## 2019-06-04 DIAGNOSIS — C679 Malignant neoplasm of bladder, unspecified: Secondary | ICD-10-CM

## 2019-06-04 DIAGNOSIS — J9610 Chronic respiratory failure, unspecified whether with hypoxia or hypercapnia: Secondary | ICD-10-CM | POA: Insufficient documentation

## 2019-06-04 NOTE — Progress Notes (Signed)
Tumor Board Documentation  GENOVEVA PU was presented by Dr Grayland Ormond at our Tumor Board on 06/04/2019, which included representatives from medical oncology, radiation oncology, pathology, radiology, surgical, navigation, internal medicine, pharmacy, pulmonology, research.  Latasha currently presents as an external consult, for [place], for new positive pathology with history of the following treatments: surgical intervention(s).  Additionally, we reviewed previous medical and familial history, history of present illness, and recent lab results along with all available histopathologic and imaging studies. The tumor board considered available treatment options and made the following recommendations: Concurrent chemo-radiation therapy, Additional screening Needs CT Chest, referral to Radiation Oncology and Medical Oncology  The following procedures/referrals were also placed: No orders of the defined types were placed in this encounter.   Clinical Trial Status: not discussed   Staging used: To be determined  AJCC Staging:       Group: At least a Stage 2 Urthelial Carcinoma of bladder   National site-specific guidelines   were discussed with respect to the case.  Tumor board is a meeting of clinicians from various specialty areas who evaluate and discuss patients for whom a multidisciplinary approach is being considered. Final determinations in the plan of care are those of the provider(s). The responsibility for follow up of recommendations given during tumor board is that of the provider.   Today's extended care, comprehensive team conference, [place] was not present for the discussion and was not examined.   Multidisciplinary Tumor Board is a multidisciplinary case peer review process.  Decisions discussed in the Multidisciplinary Tumor Board reflect the opinions of the specialists present at the conference without having examined the patient.  Ultimately, treatment and diagnostic decisions  rest with the primary provider(s) and the patient.

## 2019-06-05 ENCOUNTER — Other Ambulatory Visit: Payer: Self-pay

## 2019-06-08 ENCOUNTER — Other Ambulatory Visit: Payer: Self-pay

## 2019-06-08 ENCOUNTER — Encounter: Payer: Self-pay | Admitting: Radiation Oncology

## 2019-06-08 ENCOUNTER — Ambulatory Visit
Admission: RE | Admit: 2019-06-08 | Discharge: 2019-06-08 | Disposition: A | Discharge status: Home / Self Care | Payer: Medicare HMO | Source: Ambulatory Visit | Attending: Radiation Oncology | Admitting: Radiation Oncology

## 2019-06-08 ENCOUNTER — Encounter: Payer: Self-pay | Admitting: Oncology

## 2019-06-08 ENCOUNTER — Inpatient Hospital Stay (HOSPITAL_BASED_OUTPATIENT_CLINIC_OR_DEPARTMENT_OTHER): Payer: Medicare HMO | Admitting: Oncology

## 2019-06-08 VITALS — BP 149/72 | HR 87 | Temp 96.9°F | Resp 18

## 2019-06-08 VITALS — BP 149/72 | HR 87 | Temp 96.7°F | Resp 18 | Ht 62.21 in | Wt 101.2 lb

## 2019-06-08 DIAGNOSIS — M810 Age-related osteoporosis without current pathological fracture: Secondary | ICD-10-CM | POA: Diagnosis not present

## 2019-06-08 DIAGNOSIS — Z8711 Personal history of peptic ulcer disease: Secondary | ICD-10-CM | POA: Diagnosis not present

## 2019-06-08 DIAGNOSIS — N1832 Chronic kidney disease, stage 3b: Secondary | ICD-10-CM | POA: Diagnosis not present

## 2019-06-08 DIAGNOSIS — N1330 Unspecified hydronephrosis: Secondary | ICD-10-CM | POA: Diagnosis not present

## 2019-06-08 DIAGNOSIS — C679 Malignant neoplasm of bladder, unspecified: Secondary | ICD-10-CM | POA: Diagnosis not present

## 2019-06-08 DIAGNOSIS — Z7189 Other specified counseling: Secondary | ICD-10-CM | POA: Diagnosis not present

## 2019-06-08 DIAGNOSIS — Z79899 Other long term (current) drug therapy: Secondary | ICD-10-CM | POA: Insufficient documentation

## 2019-06-08 DIAGNOSIS — J9610 Chronic respiratory failure, unspecified whether with hypoxia or hypercapnia: Secondary | ICD-10-CM | POA: Diagnosis not present

## 2019-06-08 DIAGNOSIS — C674 Malignant neoplasm of posterior wall of bladder: Secondary | ICD-10-CM

## 2019-06-08 DIAGNOSIS — H409 Unspecified glaucoma: Secondary | ICD-10-CM | POA: Diagnosis not present

## 2019-06-08 DIAGNOSIS — J449 Chronic obstructive pulmonary disease, unspecified: Secondary | ICD-10-CM | POA: Diagnosis not present

## 2019-06-08 DIAGNOSIS — Z7982 Long term (current) use of aspirin: Secondary | ICD-10-CM | POA: Diagnosis not present

## 2019-06-08 DIAGNOSIS — Z8551 Personal history of malignant neoplasm of bladder: Secondary | ICD-10-CM | POA: Insufficient documentation

## 2019-06-08 DIAGNOSIS — Z87891 Personal history of nicotine dependence: Secondary | ICD-10-CM

## 2019-06-08 DIAGNOSIS — I10 Essential (primary) hypertension: Secondary | ICD-10-CM | POA: Diagnosis not present

## 2019-06-08 NOTE — Progress Notes (Signed)
START ON PATHWAY REGIMEN - Bladder   Gemcitabine 27 mg/m2 IV Twice Weekly + RT x 4 Weeks (Induction):   One induction cycle is 4 weeks:     Gemcitabine   **Always confirm dose/schedule in your pharmacy ordering system**  Gemcitabine 27 mg/m2 IV Twice Weekly + RT x 2.5 Weeks (Consolidation):   One consolidation cycle is 2.5 weeks:     Gemcitabine   **Always confirm dose/schedule in your pharmacy ordering system**  Patient Characteristics: Pre-Cystectomy or Nonsurgical Candidate (Clinical Staging), cT2-4a, cN0-1, M0, Bladder-Sparing Approach Therapeutic Status: Pre-Cystectomy or Nonsurgical Candidate (Clinical Staging) AJCC M Category: cM0 AJCC 8 Stage Grouping: II AJCC T Category: cT2 AJCC N Category: cN0 Intent of Therapy: Curative Intent, Discussed with Patient

## 2019-06-08 NOTE — Progress Notes (Signed)
New patient assessment today.

## 2019-06-08 NOTE — Consult Note (Signed)
NEW PATIENT EVALUATION  Name: Misty Ferguson  MRN: [id number]  Date:   06/08/2019     DOB: 10-Jun-1934   This 85 y.o. female patient presents to the clinic for initial evaluation of at least stage II (T2 N0 M0).  Urothelial carcinoma high-grade with squamous differentiation invading into the muscularis propria  REFERRING PHYSICIAN: [name], MD  CHIEF COMPLAINT:  Chief Complaint  Patient presents with  . Bladder Cancer    Initial consultation    DIAGNOSIS: The encounter diagnosis was Malignant neoplasm of urinary bladder, unspecified site [facility]).   PREVIOUS INVESTIGATIONS:  Pathology report reviewed Clinical notes reviewed CT scan abdomen and pelvis reviewed Case presented at weekly tumor board  HPI: Patient is an 85 year old female who presented with gross hematuria back in September.  CT urogram showed a 3 cm left-sided bladder base mass causing hydronephrosis of her left kidney.  She underwent a TURBT with a left ureteral stent placed on October 9 with complete resection of all viable tumor.  Hologic report showed high-grade urothelial carcinoma with muscularis propria invasion.  CT scan showed moderate left hydronephrosis and hydroureter with a intraluminal bladder mass at the left posterior superior aspect of the urinary bladder measuring approximately 2.8 x 2.8 cm.  Tumor extended apparently through the bladder wall and involving the distal ureter by direct extension.  No evidence of abdominal pelvic lymph nodes were seen.  Patient is adamantly refused radical cystectomy and was present at our weekly tumor conference where concurrent chemoradiation therapy was recommended.  She is seen today for radiation oncology opinion she is doing well she states she still having some slight bleeding I have asked her to cut back on her daily baby aspirin use.  She is currently on nasal oxygen for significant COPD.  PLANNED TREATMENT REGIMEN: Concurrent chemoradiation  PAST MEDICAL  HISTORY:  has a past medical history of Burping, Chronic airway obstruction, not elsewhere classified, Dyspnea, Dysrhythmia, Macular degeneration (senile) of retina, unspecified, Obstructive chronic bronchitis with exacerbation ([place]), Osteoporosis, unspecified, Oxygen deficiency, Personal history of peptic ulcer disease, Tobacco use disorder, Unspecified essential hypertension, and Unspecified glaucoma(365.9).    PAST SURGICAL HISTORY:  Past Surgical History:  Procedure Laterality Date  . CATARACT EXTRACTION W/ INTRAOCULAR LENS  IMPLANT, BILATERAL    . CYSTOSCOPY WITH STENT PLACEMENT Left 05/22/2019   Procedure: CYSTOSCOPY WITH STENT PLACEMENT;  Surgeon: Billey Co, MD;  Location: [facility] ORS;  Service: Urology;  Laterality: Left;  . ELBOW FRACTURE SURGERY Left   . TRANSURETHRAL RESECTION OF BLADDER TUMOR N/A 05/22/2019   Procedure: TRANSURETHRAL RESECTION OF BLADDER TUMOR (TURBT);  Surgeon: Billey Co, MD;  Location: [facility] ORS;  Service: Urology;  Laterality: N/A;    FAMILY HISTORY: family history is not on file. She was adopted.  SOCIAL HISTORY:  reports that she quit smoking about 15 years ago. Her smoking use included cigarettes. She has a 58.50 pack-year smoking history. She has never used smokeless tobacco. She reports current alcohol use of about 14.0 standard drinks of alcohol per week. She reports that she does not use drugs.  ALLERGIES: Patient has no known allergies.  MEDICATIONS:  Current Outpatient Medications  Medication Sig Dispense Refill  . acidophilus (RISAQUAD) CAPS capsule Take 1 capsule by mouth daily.    Marland Kitchen albuterol (PROVENTIL HFA;VENTOLIN HFA) 108 (90 Base) MCG/ACT inhaler Inhale 2 puffs into the lungs every 6 (six) hours as needed for wheezing or shortness of breath. 1 Inhaler 6  . albuterol (PROVENTIL) (2.5 MG/3ML) 0.083%  nebulizer solution Take 3 mLs (2.5 mg total) by nebulization every 6 (six) hours as needed for wheezing or shortness of breath. 75 mL 12  .  Ascorbic Acid (VITAMIN C PO) Take 1,000 mg by mouth daily.     Marland Kitchen aspirin 81 MG tablet Take 81 mg by mouth at bedtime.     . Cholecalciferol (VITAMIN D) 125 MCG (5000 UT) CAPS Take 5,000 Units by mouth daily.    Marland Kitchen docusate sodium (COLACE) 100 MG capsule Take 100 mg by mouth 2 (two) times daily.    . hydroxypropyl methylcellulose / hypromellose (ISOPTO TEARS / GONIOVISC) 2.5 % ophthalmic solution Place 1 drop into both eyes 3 (three) times daily as needed for dry eyes.    . Lactobacillus-Inulin (PROBIOTIC DIGESTIVE SUPPORT PO) Take 1 capsule by mouth daily.    Marland Kitchen losartan (COZAAR) 50 MG tablet TAKE 1 TABLET EVERY DAY (Patient taking differently: Take 50 mg by mouth daily. ) 90 tablet 3  . Multiple Vitamins-Minerals (PRESERVISION AREDS) TABS Take 1 tablet by mouth 2 (two) times daily.    . mupirocin ointment (BACTROBAN) 2 % Apply 1 application topically daily.     . Omega-3 Fatty Acids (FISH OIL) 1000 MG CAPS Take 1,000 mg by mouth 2 (two) times daily.     Marland Kitchen OVER THE COUNTER MEDICATION Take 1 tablet by mouth daily. Super Cleanser    . OXYGEN Inhale 3.5 L into the lungs 3 (three) times daily as needed (shortness of breath).    . silver sulfADIAZINE (SILVADENE) 1 % cream Apply 1 application topically daily. 50 g 0  . STIOLTO RESPIMAT 2.5-2.5 MCG/ACT AERS INHALE 2 PUFFS INTO THE LUNGS DAILY. 12 g 1  . triamcinolone cream (KENALOG) 0.1 % Apply 1 application topically 3 (three) times a week.    . verapamil (VERELAN PM) 360 MG 24 hr capsule Take 360 mg by mouth daily.     . Vitamin E 180 MG CAPS Take 180 mg by mouth daily.    Marland Kitchen zinc gluconate 50 MG tablet Take 50 mg by mouth daily.     No current facility-administered medications for this encounter.     ECOG PERFORMANCE STATUS:  1 - Symptomatic but completely ambulatory  REVIEW OF SYSTEMS: Except for the gross hematuria and nasal dependent oxygen for her COPD Patient denies any weight loss, fatigue, weakness, fever, chills or night sweats. Patient  denies any loss of vision, blurred vision. Patient denies any ringing  of the ears or hearing loss. No irregular heartbeat. Patient denies heart murmur or history of fainting. Patient denies any chest pain or pain radiating to her upper extremities. Patient denies any shortness of breath, difficulty breathing at night, cough or hemoptysis. Patient denies any swelling in the lower legs. Patient denies any nausea vomiting, vomiting of blood, or coffee ground material in the vomitus. Patient denies any stomach pain. Patient states has had normal bowel movements no significant constipation or diarrhea. Patient denies any dysuria, hematuria or significant nocturia. Patient denies any problems walking, swelling in the joints or loss of balance. Patient denies any skin changes, loss of hair or loss of weight. Patient denies any excessive worrying or anxiety or significant depression. Patient denies any problems with insomnia. Patient denies excessive thirst, polyuria, polydipsia. Patient denies any swollen glands, patient denies easy bruising or easy bleeding. Patient denies any recent infections, allergies or URI. Patient "s visual fields have not changed significantly in recent time.   PHYSICAL EXAM: BP (!) 149/72   Pulse  87   Temp (!) 96.9 F (36.1 C)   Resp 18  Well-developed well-nourished patient in NAD. HEENT reveals PERLA, EOMI, discs not visualized.  Oral cavity is clear. No oral mucosal lesions are identified. Neck is clear without evidence of cervical or supraclavicular adenopathy. Lungs are clear to A&P. Cardiac examination is essentially unremarkable with regular rate and rhythm without murmur rub or thrill. Abdomen is benign with no organomegaly or masses noted. Motor sensory and DTR levels are equal and symmetric in the upper and lower extremities. Cranial nerves II through XII are grossly intact. Proprioception is intact. No peripheral adenopathy or edema is identified. No motor or sensory levels  are noted. Crude visual fields are within normal range.  LABORATORY DATA: Pathology report reviewed compatible with above-stated findings    RADIOLOGY RESULTS: CT scan reviewed chest CT scan pending   IMPRESSION: High-grade urothelial carcinoma least a T2 lesion with muscularis propria invasion in 85 year old female on nasal oxygen who refuses surgical intervention for concurrent chemoradiation  PLAN: At this time I have recommended concurrent chemoradiation.  I would plan on treating a large field pelvis including her bladder and local regional lymph nodes up to 4500 cGy over 5 weeks.  I would also boost the left lower bladder mass approximately 2000 cGy in 11 fractions.  Risks and benefits of treatment including increased lower urinary tract symptoms fatigue possible diarrhea possible skin reaction possible alteration of blood counts all were described in detail to the patient.  Her husband was accompanying her during my consultation.  They both seem to comprehend my treatment plan well.  There will be extra effort by both professional staff as well as technical staff to coordinate and manage concurrent chemoradiation and ensuing side effects during her treatments.  I have personally ordered and scheduled CT simulation for later this week.  Both her husband and patient comprehends her treatment plan well.  She will be seeing Dr. Dennis Bast after my appointment for consideration of chemotherapy.  I would like to take this opportunity to thank you for allowing me to participate in the care of your patient.Noreene Filbert, MD

## 2019-06-09 ENCOUNTER — Other Ambulatory Visit: Payer: Self-pay

## 2019-06-09 NOTE — Progress Notes (Addendum)
Hematology/Oncology Consult note [facility] Telephone:([phone] Fax:([phone]   Patient Care Team: [name], MD as PCP - General Flora Lipps, MD as Consulting Physician (Pulmonary Disease) Birder Robson, MD as Referring Physician (Ophthalmology) Ubaldo Glassing Javier Docker, MD as Consulting Physician (Cardiology)  REFERRING PROVIDER: Billey Co, MD  CHIEF COMPLAINTS/REASON FOR VISIT:  Evaluation of muscle invasive bladder cancer   HISTORY OF PRESENTING ILLNESS:   Misty Ferguson is a  85 y.o.  female with PMH listed below was seen in consultation at the request of  Billey Co, MD  for evaluation of bladder cancer. Patient establish care with urology Dr. Diamantina Providence due to gross hematuria. 05/12/2019 CT hematuria work-up showed moderate left hydronephrosis with hydroureter with a intraluminal mass at the left posterior superior aspect of the urinary obstructing distal left ureter No lymphadenopathy in abdomen or pelvis.  No evidence of metastatic disease in the abdomen or pelvis. Nonspecific small left pleural effusion. Patient underwent TURBT on 05/22/2019.  And left ureteral stent placement. Pathology showed high-grade urothelial carcinoma, with squamous differentiation.  Invasive to muscular propria Patient had a discussion with urology.  Standard of care including neoadjuvant chemotherapy followed by cystectomy with lymph node dissection and urinary diversion was discussed between patient and urology.  Is adamant that he does not want to undergo any surgery at this point. Trimodality therapy with surgery, radiation and chemotherapy were discussed.  And the patient is interested. Patient's case was discussed on multidisciplinary tumor board on 06/04/2019 and consensus reached on Tri modality therapy.  Patient was referred to oncology for further discussion.  Patient has history of COPD, chronic respiratory failure on home oxygen as needed.  No  additional hematuria episodes.  Denies any pain today Former smoker, 58-pack-year smoking history, quit in 2005. Patient expresses her wishes of not willing to proceed with any aggressive treatment.  She says" I am not afraid of dying". She is not interested in cystectomy.  She has establish care with radiation oncology for radiation and will start CT simulation on 06/10/2019. Accompanied by husband Mr. Cristea.  Review of Systems  Constitutional: Positive for fatigue. Negative for appetite change, chills and fever.  HENT:   Negative for hearing loss and voice change.   Eyes: Negative for eye problems.  Respiratory: Positive for shortness of breath. Negative for chest tightness and cough.   Cardiovascular: Negative for chest pain.  Gastrointestinal: Negative for abdominal distention, abdominal pain and blood in stool.  Endocrine: Negative for hot flashes.  Genitourinary: Positive for hematuria. Negative for difficulty urinating and frequency.   Musculoskeletal: Negative for arthralgias.  Skin: Negative for itching and rash.  Neurological: Negative for extremity weakness.  Hematological: Negative for adenopathy.  Psychiatric/Behavioral: Negative for confusion.    MEDICAL HISTORY:  Past Medical History:  Diagnosis Date   Burping    Chronic airway obstruction, not elsewhere classified    Dyspnea    Dysrhythmia    Macular degeneration (senile) of retina, unspecified    Obstructive chronic bronchitis with exacerbation (HCC)    Osteoporosis, unspecified    Oxygen deficiency    Personal history of peptic ulcer disease    Tobacco use disorder    Unspecified essential hypertension    Unspecified glaucoma(365.9)     SURGICAL HISTORY: Past Surgical History:  Procedure Laterality Date   CATARACT EXTRACTION W/ INTRAOCULAR LENS  IMPLANT, BILATERAL     CYSTOSCOPY WITH STENT PLACEMENT Left 05/22/2019   Procedure: CYSTOSCOPY WITH STENT PLACEMENT;  Surgeon: Diamantina Providence,  Herbert Seta, MD;   Location: [facility];  Service: Urology;  Laterality: Left;   ELBOW FRACTURE SURGERY Left    TRANSURETHRAL RESECTION OF BLADDER TUMOR N/A 05/22/2019   Procedure: TRANSURETHRAL RESECTION OF BLADDER TUMOR (TURBT);  Surgeon: Billey Co, MD;  Location: [facility];  Service: Urology;  Laterality: N/A;    SOCIAL HISTORY: Social History   Socioeconomic History   Marital status: Married    Spouse name: Not on file   Number of children: 3   Years of education: Not on file   Highest education level: Not on file  Occupational History   Occupation: RETIRED    Employer: RETIRED  Social Designer, fashion/clothing strain: Not on file   Food insecurity    Worry: Not on file    Inability: Not on file   Transportation needs    Medical: Not on file    Non-medical: Not on file  Tobacco Use   Smoking status: Former Smoker    Packs/day: 1.30    Years: 45.00    Pack years: 58.50    Types: Cigarettes    Quit date: 08/14/2003    Years since quitting: 15.8   Smokeless tobacco: Never Used  Substance and Sexual Activity   Alcohol use: Yes    Alcohol/week: 14.0 standard drinks    Types: 14 Glasses of wine per week   Drug use: No   Sexual activity: Yes  Lifestyle   Physical activity    Days per week: Not on file    Minutes per session: Not on file   Stress: Not on file  Relationships   Social connections    Talks on phone: Not on file    Gets together: Not on file    Attends religious service: Not on file    Active member of club or organization: Not on file    Attends meetings of clubs or organizations: Not on file    Relationship status: Not on file   Intimate partner violence    Fear of current or ex partner: Not on file    Emotionally abused: Not on file    Physically abused: Not on file    Forced sexual activity: Not on file  Other Topics Concern   Not on file  Social History Narrative   Tobacco abuse x 45 years    FAMILY HISTORY: Family History  Adopted:  Yes    ALLERGIES:  has No Known Allergies.  MEDICATIONS:  Current Outpatient Medications  Medication Sig Dispense Refill   acidophilus (RISAQUAD) CAPS capsule Take 1 capsule by mouth daily.     albuterol (PROVENTIL HFA;VENTOLIN HFA) 108 (90 Base) MCG/ACT inhaler Inhale 2 puffs into the lungs every 6 (six) hours as needed for wheezing or shortness of breath. 1 Inhaler 6   albuterol (PROVENTIL) (2.5 MG/3ML) 0.083% nebulizer solution Take 3 mLs (2.5 mg total) by nebulization every 6 (six) hours as needed for wheezing or shortness of breath. 75 mL 12   Ascorbic Acid (VITAMIN C PO) Take 1,000 mg by mouth daily.      Cholecalciferol (VITAMIN D) 125 MCG (5000 UT) CAPS Take 5,000 Units by mouth daily.     docusate sodium (COLACE) 100 MG capsule Take 100 mg by mouth 2 (two) times daily.     hydroxypropyl methylcellulose / hypromellose (ISOPTO TEARS / GONIOVISC) 2.5 % ophthalmic solution Place 1 drop into both eyes 3 (three) times daily as needed for dry eyes.     Lactobacillus-Inulin (PROBIOTIC DIGESTIVE  SUPPORT PO) Take 1 capsule by mouth daily.     losartan (COZAAR) 50 MG tablet TAKE 1 TABLET EVERY DAY (Patient taking differently: Take 50 mg by mouth daily. ) 90 tablet 3   Multiple Vitamins-Minerals (PRESERVISION AREDS) TABS Take 1 tablet by mouth 2 (two) times daily.     mupirocin ointment (BACTROBAN) 2 % Apply 1 application topically daily.      Omega-3 Fatty Acids (FISH OIL) 1000 MG CAPS Take 1,000 mg by mouth 2 (two) times daily.      OXYGEN Inhale 3.5 L into the lungs 3 (three) times daily as needed (shortness of breath).     silver sulfADIAZINE (SILVADENE) 1 % cream Apply 1 application topically daily. 50 g 0   STIOLTO RESPIMAT 2.5-2.5 MCG/ACT AERS INHALE 2 PUFFS INTO THE LUNGS DAILY. 12 g 1   triamcinolone cream (KENALOG) 0.1 % Apply 1 application topically 3 (three) times a week.     verapamil (VERELAN PM) 360 MG 24 hr capsule Take 360 mg by mouth daily.      Vitamin E 180  MG CAPS Take 180 mg by mouth daily.     zinc gluconate 50 MG tablet Take 50 mg by mouth daily.     aspirin 81 MG tablet Take 81 mg by mouth at bedtime.      OVER THE COUNTER MEDICATION Take 1 tablet by mouth daily. Super Cleanser     No current facility-administered medications for this visit.      PHYSICAL EXAMINATION: ECOG PERFORMANCE STATUS: 2 - Symptomatic, <50% confined to bed Vitals:   06/08/19 1102  BP: (!) 149/72  Pulse: 87  Resp: 18  Temp: (!) 96.7 F (35.9 C)   Filed Weights   06/08/19 1102  Weight: 101 lb 3.2 oz (45.9 kg)    Physical Exam Constitutional:      General: She is not in acute distress.    Comments: Sitting in the wheelchair.  Thin Built  HENT:     Head: Normocephalic and atraumatic.  Eyes:     General: No scleral icterus.    Pupils: Pupils are equal, round, and reactive to light.  Neck:     Musculoskeletal: Normal range of motion and neck supple.  Cardiovascular:     Rate and Rhythm: Normal rate and regular rhythm.     Heart sounds: Normal heart sounds.  Pulmonary:     Effort: Pulmonary effort is normal. No respiratory distress.     Breath sounds: No wheezing.     Comments: Severely diminished bilateral breathing sound. Abdominal:     General: Bowel sounds are normal. There is no distension.     Palpations: Abdomen is soft. There is no mass.     Tenderness: There is no abdominal tenderness.  Musculoskeletal: Normal range of motion.        General: No deformity.  Skin:    General: Skin is warm and dry.     Findings: No erythema or rash.  Neurological:     Mental Status: She is alert and oriented to person, place, and time.     Cranial Nerves: No cranial nerve deficit.     Coordination: Coordination normal.  Psychiatric:        Behavior: Behavior normal.        Thought Content: Thought content normal.     LABORATORY DATA:  I have reviewed the data as listed Lab Results  Component Value Date   WBC 6.5 04/30/2019   HGB 12.4  04/30/2019  HCT 37.1 04/30/2019   MCV 92 04/30/2019   PLT 295 04/30/2019   Recent Labs    10/17/18 0813 04/30/19 1613 05/27/19 1101  NA 139 139 138  K 4.5 4.9 4.4  CL 102 102 99  CO2 27 20 21   GLUCOSE 96 105* 119*  BUN 22 21 21   CREATININE 0.94 1.46* 1.29*  CALCIUM 10.3 10.0 9.9  GFRNONAA  --  33* 39*  GFRAA  --  38* 45*  PROT 7.2  --   --   ALBUMIN 5.1  --   --   AST 22  --   --   ALT 19  --   --   ALKPHOS 57  --   --   BILITOT 0.6  --   --    Iron/TIBC/Ferritin/ %Sat No results found for: IRON, TIBC, FERRITIN, IRONPCTSAT    RADIOGRAPHIC STUDIES: I have personally reviewed the radiological images as listed and agreed with the findings in the report.  Chest 2 View  Result Date: 05/14/2019 CLINICAL DATA:  Preop bladder tumor. EXAM: CHEST - 2 VIEW COMPARISON:  10/24/2017 FINDINGS: Heart is normal size. Increased markings in the lungs bilaterally, felt represent scarring. No effusions. Nodular density projects over the left upper lung in the perihilar region, new since prior study. No confluent opacity on the right. No acute bony abnormality. Sclerotic focus in the left humeral head is stable since prior study, likely bone infarct or enchondroma. IMPRESSION: Increased markings throughout the lungs, likely chronic lung disease/scarring. Nodular density projects over the left upper lobe. This could reflect overlapping shadows, but recommend further evaluation with CT. Electronically Signed   By: Rolm Baptise M.D.   On: 05/14/2019 10:54   Ct Hematuria Workup  Result Date: 05/12/2019 CLINICAL DATA:  Gross hematuria EXAM: CT ABDOMEN AND PELVIS WITHOUT AND WITH CONTRAST TECHNIQUE: Multidetector CT imaging of the abdomen and pelvis was performed following the standard protocol before and following the bolus administration of intravenous contrast. CONTRAST:  43mL OMNIPAQUE IOHEXOL 300 MG/ML  SOLN COMPARISON:  None. FINDINGS: Lower chest: Emphysema. Bibasilar atelectasis or scarring and  small left pleural effusion. Hepatobiliary: No solid liver abnormality is seen. No gallstones, gallbladder wall thickening, or biliary dilatation. Pancreas: Coarse pancreatic parenchymal calcifications and parenchymal atrophy. No pancreatic ductal dilatation or surrounding inflammatory changes. Spleen: Normal in size without significant abnormality. Adrenals/Urinary Tract: Adrenal glands are unremarkable. Moderate left hydronephrosis and hydroureter with an intraluminal mass at the left posterosuperior aspect of the urinary bladder obstructing the distal left ureter, measuring 2.8 x 2.8 x 2.6 cm (series 9, image 58, series 12, image 61). This mass appears to extend through the bladder wall and involve the distal ureter by direct extension (series 12, image 68, series 14, image 95). Stomach/Bowel: Stomach is within normal limits. The appendix is enlarged and retrocecal, measuring 1.0 cm in caliber without adjacent fat stranding. No evidence of bowel wall thickening, distention, or inflammatory changes. Vascular/Lymphatic: Aortic atherosclerosis. No enlarged abdominal or pelvic lymph nodes. Reproductive: No mass or other significant abnormality. Other: No abdominal wall hernia or abnormality. No abdominopelvic ascites. Musculoskeletal: No acute or significant osseous findings. IMPRESSION: 1. Moderate left hydronephrosis and hydroureter with an intraluminal mass at the left posterosuperior aspect of the urinary bladder obstructing the distal left ureter, measuring 2.8 x 2.8 x 2.6 cm (series 9, image 58, series 12, image 61). This mass appears to extend through the bladder wall and involve the distal ureter by direct extension (series 12, image 68, series 14,  image 95). Findings are highly concerning for bladder malignancy. 2. No evidence of abdominal or pelvic lymphadenopathy and no evidence of metastatic disease in the abdomen or pelvis. 3.  Nonspecific small left pleural effusion. 4.  Stigmata of chronic  pancreatitis. 5. The appendix is enlarged and retrocecal, measuring 1.0 cm in caliber without adjacent fat stranding. This is likely a chronic, patulous appendix without particular findings to suggest acute inflammation or acute appendicitis. 6. Aortic Atherosclerosis (ICD10-I70.0) and Emphysema (ICD10-J43.9). Electronically Signed   By: Eddie Candle M.D.   On: 05/12/2019 08:59      ASSESSMENT & PLAN:  1. Malignant neoplasm of posterior wall of urinary bladder (HCC)   2. Stage 3b chronic kidney disease   3. Hydronephrosis, left   4. Goals of care, counseling/discussion    #Images were independently reviewed by me and discussed.  Pathology was reviewed and discussed with both patient and husband. Muscle invasive high-grade urothelial carcinoma with squamous differentiation.  cT2N0 M0 Recommend patient to proceed with CT chest to complete staging.  #I had a lengthy discussion with patient regarding treatment plan. Standard of care including neoadjuvant chemotherapy followed by cystectomy with lymph node dissection were discussed again with patient and patient adamantly declines.  She is concerned that her COPD/chronic respiratory failure will not allow her to tolerate back surgeries. We also discussed about trimodality treatments including bladder tumor resection-already completed, chemotherapy and radiation.  Goal of care is to cure Patient is not a candidate for cisplatin due to chronic kidney failure. Discussed about concurrent chemotherapy 5-FU/mitomycin which serves a radiosensitizer.  Discussed about phase 3 data comparing concurrent chemo/radiation to radiation alone.  Concurrent chemoradiation improved local regional disease free survival and a trend towards increase 5-year overall survival. This regimen will need Mediport placement which patient is not interested.  She prefers chemotherapy regimen that is less aggressive. We discussed about single agent Taxol or low-dose gemcitabine to be  used concurrently with radiation. Patient reports pre-existing neuropathy which may be further exacerbated by Taxol. I recommend patient to consider low-dose gemcitabine 27 mg/m twice per week concurrently with radiation.  I explained to the patient the risks and benefits of chemotherapy including all but not limited to infusion reaction, hair loss,  mouth sore, nausea, vomiting, low blood counts, bleeding, risk of life threatening infection and even death, secondary malignancy etc.  Patient voices understanding and willing to proceed chemotherapy.   # Chemotherapy education.  #Patient wants a few days to consider her options she would like to also discuss further with urology and radiology. I asked patient to give me an update after she decides.  Orders Placed This Encounter  Procedures   CT Chest W Contrast    Standing Status:   Future    Standing Expiration Date:   06/07/2020    Order Specific Question:   ** REASON FOR EXAM (FREE TEXT)    Answer:   Bladder cancer staging    Order Specific Question:   If indicated for the ordered procedure, I authorize the administration of contrast media per Radiology protocol    Answer:   Yes    Order Specific Question:   Preferred imaging location?    Answer:   [facility]    Order Specific Question:   Radiology Contrast Protocol - do NOT remove file path    Answer:   \[email or web address]    All questions were answered. The patient knows to call the clinic with any problems questions or concerns.  cc Nickolas Madrid  C, MD   #Patient had a follow-up visit with urology today.discussed with Dr.Snisky.  She had another repeat chemistry lab done to see if her kidney function would improve or not for her to be a cisplatin candidate. Patient called me this afternoon and we had a lengthy phone discussion. Currently kidney function is pending.  We discussed about the possibility of cisplatin treatments concurrently with  radiation if her kidney function improves.  I discussed with her about potential side effects including but not limited to neuropathy and hearing loss. Patient raises her concerns about her baseline hearing loss, also the fact that she has poor vision, she does not want to take any risk of potential hearing loss. She also expresses her wishes of not being overtly aggressive as she has multiple other medical conditions and she knows she will not have a very long life expectancy anyhow.  She values every day life quality  over life expectancy. She also does not want to proceed with CT chest to complete staging.  She fully understands that a potential distant metastasis can be missed without full staging, she is willing to take that risk.  We discussed her most recent chest x-ray results.  She adamantly declines CT chest. She informs me that she is interested in proceeding with low-dose gemcitabine concurrently with radiation.  She will go to chemotherapy class next week. Plan discussed with neurology.  Return of visit: 1 week Thank you for this kind referral and the opportunity to participate in the care of this patient. A copy of today's note is routed to referring provider  Total face to face encounter time for this patient visit was 60 min. >50% of the time was  spent in counseling and coordination of care.    Earlie Server, MD, PhD Hematology Oncology [facility] at [facility] Pager- [phone] 06/09/2019

## 2019-06-10 ENCOUNTER — Ambulatory Visit
Admission: RE | Admit: 2019-06-10 | Discharge: 2019-06-10 | Disposition: A | Discharge status: Home / Self Care | Payer: Medicare HMO | Source: Ambulatory Visit | Attending: Radiation Oncology | Admitting: Radiation Oncology

## 2019-06-10 ENCOUNTER — Ambulatory Visit (INDEPENDENT_AMBULATORY_CARE_PROVIDER_SITE_OTHER): Payer: Medicare HMO | Admitting: Urology

## 2019-06-10 ENCOUNTER — Encounter: Payer: Self-pay | Admitting: Urology

## 2019-06-10 ENCOUNTER — Other Ambulatory Visit: Payer: Self-pay

## 2019-06-10 VITALS — BP 131/84 | HR 84 | Ht 61.0 in | Wt 101.0 lb

## 2019-06-10 DIAGNOSIS — C679 Malignant neoplasm of bladder, unspecified: Secondary | ICD-10-CM | POA: Diagnosis not present

## 2019-06-10 MED ORDER — PROCHLORPERAZINE MALEATE 10 MG PO TABS: 10.0000 mg | ORAL_TABLET | Freq: Four times a day (QID) | ORAL | 1 refills | Status: DC | PRN

## 2019-06-10 MED ORDER — ONDANSETRON HCL 8 MG PO TABS: 8.0000 mg | ORAL_TABLET | Freq: Two times a day (BID) | ORAL | 1 refills | Status: DC | PRN

## 2019-06-10 NOTE — Addendum Note (Signed)
Addended by: Earlie Server on: 06/10/2019 08:30 PM   Modules accepted: Orders

## 2019-06-10 NOTE — Progress Notes (Signed)
   06/10/2019 1:33 PM   Misty Ferguson April 25, 1934 [id number]  Reason for visit: Follow up muscle invasive bladder cancer  HPI: I saw [name] back in urology clinic for bladder cancer.  She is an 85 year old female who is relatively healthy aside from oxygen dependent COPD who underwent TURBT and left ureteral stent placement on 05/22/2019 for a 3 cm bladder tumor.  Final pathology showed high-grade muscle invasive urothelial cell carcinoma squamous differentiation.  She elected for trimodal therapy with chemotherapy and radiation.  She had a chest x-ray with a subtle nodular density of the left upper lobe that may reflect overlapping shadows but further CT chest was recommended.  She refused CT chest.   She has already met with Dr. Baruch Gouty from radiation oncology as well as Dr. Tasia Catchings with oncology.  Her most recent creatinine just 5 days after surgery was 1.29 from 1.46 before surgery.  I recommended a repeat creatinine today now that she has been drained with a ureteral stent for over 2 weeks, as this may alter what chemotherapy regimen she is eligible for.  We discussed the risk of recurrence at length today, possible need for repeat TURBT in the future, and need for ongoing close surveillance.  -Proceed with trimodal therapy with radiation and chemotherapy -We will follow-up creatinine and call with results -Stent will need to be changed in ~9 months -Plan for surveillance cystoscopy in January 2021  A total of 25 minutes were spent face-to-face with the patient, greater than 50% was spent in patient education, counseling, and coordination of care regarding muscle invasive bladder cancer and trimodal therapy.  Billey Co, [facility] [address] [place], [place] [address] [phone]

## 2019-06-11 ENCOUNTER — Encounter: Payer: Self-pay | Admitting: Oncology

## 2019-06-11 ENCOUNTER — Other Ambulatory Visit: Payer: Self-pay

## 2019-06-11 ENCOUNTER — Telehealth: Payer: Self-pay

## 2019-06-11 DIAGNOSIS — L570 Actinic keratosis: Secondary | ICD-10-CM | POA: Diagnosis not present

## 2019-06-11 DIAGNOSIS — D692 Other nonthrombocytopenic purpura: Secondary | ICD-10-CM | POA: Diagnosis not present

## 2019-06-11 DIAGNOSIS — I878 Other specified disorders of veins: Secondary | ICD-10-CM | POA: Diagnosis not present

## 2019-06-11 DIAGNOSIS — C674 Malignant neoplasm of posterior wall of bladder: Secondary | ICD-10-CM

## 2019-06-11 DIAGNOSIS — C679 Malignant neoplasm of bladder, unspecified: Secondary | ICD-10-CM | POA: Diagnosis not present

## 2019-06-11 LAB — COMPREHENSIVE METABOLIC PANEL
ALT: 22 IU/L (ref 0–32)
AST: 21 IU/L (ref 0–40)
Albumin/Globulin Ratio: 2.1 (ref 1.2–2.2)
Albumin: 4.5 g/dL (ref 3.6–4.6)
Alkaline Phosphatase: 88 IU/L (ref 39–117)
BUN/Creatinine Ratio: 15 (ref 12–28)
BUN: 15 mg/dL (ref 8–27)
Bilirubin Total: 0.4 mg/dL (ref 0.0–1.2)
CO2: 22 mmol/L (ref 20–29)
Calcium: 10.1 mg/dL (ref 8.7–10.3)
Chloride: 104 mmol/L (ref 96–106)
Creatinine, Ser: 0.98 mg/dL (ref 0.57–1.00)
GFR calc Af Amer: 62 mL/min/{1.73_m2} (ref >59)
GFR calc non Af Amer: 54 mL/min/{1.73_m2} — ABNORMAL LOW (ref >59)
Globulin, Total: 2.1 g/dL (ref 1.5–4.5)
Glucose: 98 mg/dL (ref 65–99)
Potassium: 4.9 mmol/L (ref 3.5–5.2)
Sodium: 141 mmol/L (ref 134–144)
Total Protein: 6.6 g/dL (ref 6.0–8.5)

## 2019-06-11 NOTE — Telephone Encounter (Signed)
Patient spoke to scheduler and had multiple questions. I called patient back and spoke to spouse, Randall Hiss, with patient present. Mrs. Lauter was upset because she saw many appointments on Mychart and that is "too much", she stated. I explained to them what each appointment was and the importance of keeping them. She stated "I am close to cancelling appointments, this is overwhelming." She was also upset because she has to get COVID test, again the importance of test was addressed.  Patient's husband was very understanding and stated they would be here for appointments.

## 2019-06-11 NOTE — Patient Instructions (Signed)
Gemcitabine injection What is this medicine? GEMCITABINE (jem SYE ta been) is a chemotherapy drug. This medicine is used to treat many types of cancer like breast cancer, lung cancer, pancreatic cancer, and ovarian cancer. This medicine may be used for other purposes; ask your health care provider or pharmacist if you have questions. COMMON BRAND NAME(S): Gemzar, Infugem What should I tell my health care provider before I take this medicine? They need to know if you have any of these conditions:  blood disorders  infection  kidney disease  liver disease  lung or breathing disease, like asthma  recent or ongoing radiation therapy  an unusual or allergic reaction to gemcitabine, other chemotherapy, other medicines, foods, dyes, or preservatives  pregnant or trying to get pregnant  breast-feeding How should I use this medicine? This drug is given as an infusion into a vein. It is administered in a hospital or clinic by a specially trained health care professional. Talk to your pediatrician regarding the use of this medicine in children. Special care may be needed. Overdosage: If you think you have taken too much of this medicine contact a poison control center or emergency room at once. NOTE: This medicine is only for you. Do not share this medicine with others. What if I miss a dose? It is important not to miss your dose. Call your doctor or health care professional if you are unable to keep an appointment. What may interact with this medicine?  medicines to increase blood counts like filgrastim, pegfilgrastim, sargramostim  some other chemotherapy drugs like cisplatin  vaccines Talk to your doctor or health care professional before taking any of these medicines:  acetaminophen  aspirin  ibuprofen  ketoprofen  naproxen This list may not describe all possible interactions. Give your health care provider a list of all the medicines, herbs, non-prescription drugs, or  dietary supplements you use. Also tell them if you smoke, drink alcohol, or use illegal drugs. Some items may interact with your medicine. What should I watch for while using this medicine? Visit your doctor for checks on your progress. This drug may make you feel generally unwell. This is not uncommon, as chemotherapy can affect healthy cells as well as cancer cells. Report any side effects. Continue your course of treatment even though you feel ill unless your doctor tells you to stop. In some cases, you may be given additional medicines to help with side effects. Follow all directions for their use. Call your doctor or health care professional for advice if you get a fever, chills or sore throat, or other symptoms of a cold or flu. Do not treat yourself. This drug decreases your body's ability to fight infections. Try to avoid being around people who are sick. This medicine may increase your risk to bruise or bleed. Call your doctor or health care professional if you notice any unusual bleeding. Be careful brushing and flossing your teeth or using a toothpick because you may get an infection or bleed more easily. If you have any dental work done, tell your dentist you are receiving this medicine. Avoid taking products that contain aspirin, acetaminophen, ibuprofen, naproxen, or ketoprofen unless instructed by your doctor. These medicines may hide a fever. Do not become pregnant while taking this medicine or for 6 months after stopping it. Women should inform their doctor if they wish to become pregnant or think they might be pregnant. Men should not father a child while taking this medicine and for 3 months after stopping it.   There is a potential for serious side effects to an unborn child. Talk to your health care professional or pharmacist for more information. Do not breast-feed an infant while taking this medicine or for at least 1 week after stopping it. Men should inform their doctors if they wish  to father a child. This medicine may lower sperm counts. Talk with your doctor or health care professional if you are concerned about your fertility. What side effects may I notice from receiving this medicine? Side effects that you should report to your doctor or health care professional as soon as possible:  allergic reactions like skin rash, itching or hives, swelling of the face, lips, or tongue  breathing problems  pain, redness, or irritation at site where injected  signs and symptoms of a dangerous change in heartbeat or heart rhythm like chest pain; dizziness; fast or irregular heartbeat; palpitations; feeling faint or lightheaded, falls; breathing problems  signs of decreased platelets or bleeding - bruising, pinpoint red spots on the skin, black, tarry stools, blood in the urine  signs of decreased red blood cells - unusually weak or tired, feeling faint or lightheaded, falls  signs of infection - fever or chills, cough, sore throat, pain or difficulty passing urine  signs and symptoms of kidney injury like trouble passing urine or change in the amount of urine  signs and symptoms of liver injury like dark yellow or brown urine; general ill feeling or flu-like symptoms; light-colored stools; loss of appetite; nausea; right upper belly pain; unusually weak or tired; yellowing of the eyes or skin  swelling of ankles, feet, hands Side effects that usually do not require medical attention (report to your doctor or health care professional if they continue or are bothersome):  constipation  diarrhea  hair loss  loss of appetite  nausea  rash  vomiting This list may not describe all possible side effects. Call your doctor for medical advice about side effects. You may report side effects to FDA at 1-800-FDA-1088. Where should I keep my medicine? This drug is given in a hospital or clinic and will not be stored at home. NOTE: This sheet is a summary. It may not cover all  possible information. If you have questions about this medicine, talk to your doctor, pharmacist, or health care provider.  2020 Elsevier/Gold Standard (2017-10-23 18:06:11)  

## 2019-06-12 ENCOUNTER — Other Ambulatory Visit: Payer: Medicare HMO

## 2019-06-12 ENCOUNTER — Other Ambulatory Visit: Payer: Self-pay

## 2019-06-12 DIAGNOSIS — Z20822 Contact with and (suspected) exposure to covid-19: Secondary | ICD-10-CM

## 2019-06-12 DIAGNOSIS — Z20828 Contact with and (suspected) exposure to other viral communicable diseases: Secondary | ICD-10-CM | POA: Diagnosis not present

## 2019-06-13 LAB — NOVEL CORONAVIRUS, NAA: SARS-CoV-2, NAA: NOT DETECTED

## 2019-06-14 ENCOUNTER — Encounter: Payer: Self-pay | Admitting: Oncology

## 2019-06-14 DIAGNOSIS — Z7189 Other specified counseling: Secondary | ICD-10-CM | POA: Insufficient documentation

## 2019-06-15 ENCOUNTER — Inpatient Hospital Stay: Payer: Medicare HMO | Attending: Oncology

## 2019-06-15 ENCOUNTER — Inpatient Hospital Stay (HOSPITAL_BASED_OUTPATIENT_CLINIC_OR_DEPARTMENT_OTHER): Payer: Medicare HMO | Admitting: Oncology

## 2019-06-15 ENCOUNTER — Telehealth: Payer: Self-pay | Admitting: Oncology

## 2019-06-15 ENCOUNTER — Other Ambulatory Visit: Payer: Self-pay

## 2019-06-15 DIAGNOSIS — C674 Malignant neoplasm of posterior wall of bladder: Secondary | ICD-10-CM | POA: Diagnosis not present

## 2019-06-15 DIAGNOSIS — I10 Essential (primary) hypertension: Secondary | ICD-10-CM | POA: Insufficient documentation

## 2019-06-15 NOTE — Telephone Encounter (Signed)
Called patient and she decides not to go on with chemotherapy. She wants to do radiation only.  Chemotherapy appt will be cancelled.

## 2019-06-15 NOTE — Progress Notes (Signed)
[place]  Telephone:([phone] [phone]  Patient Care Team: [name], MD as PCP - General Flora Lipps, MD as Consulting Physician (Pulmonary Disease) Birder Robson, MD as Referring Physician (Ophthalmology) Teodoro Spray, MD as Consulting Physician (Cardiology)   Name of the patient: Misty Ferguson  [id number]  Oct 02, 1933   Date of visit: 06/15/19  Diagnosis-stage II bladder cancer  Chief complaint/Reason for visit- Initial Meeting for [facility], preparing for starting chemotherapy  Heme/Onc history:  Oncology History  Malignant neoplasm of urinary bladder ([place])  06/08/2019 Initial Diagnosis   Malignant neoplasm of urinary bladder ([place])   06/08/2019 Cancer Staging   Staging form: Urinary Bladder, AJCC 8th Edition - Clinical stage from 06/08/2019: Stage II (cT2, cN0, cM0) - Signed by Earlie Server, MD on 06/08/2019   06/18/2019 -  Chemotherapy   The patient had gemcitabine (GEMZAR) 38 mg in sodium chloride 0.9 % 250 mL chemo infusion, 27 mg/m2 = 38 mg (original dose ), Intravenous,  Once, 0 of 5 cycles Dose modification: 27 mg/m2 (Cycle 1, Reason: Other (see comments))  for chemotherapy treatment.      Interval history-Ms. Alavi is a 85 year old female who presents to chemo care clinic today for initial meeting in preparation for starting chemotherapy. I introduced the chemo care clinic and we discussed that the role of the clinic is to assist those who are at an increased risk of emergency room visits and/or complications during the course of chemotherapy treatment. We discussed that the increased risk takes into account factors such as age, performance status, and co-morbidities. We also discussed that for some, this might include barriers to care such as not having a primary care provider, lack of insurance/transportation, or not being able to afford medications. We discussed that the goal of  the program is to help prevent unplanned ER visits and help reduce complications during chemotherapy. We do this by discussing specific risk factors to each individual and identifying ways that we can help improve these risk factors and reduce barriers to care.   ECOG FS:2 - Symptomatic, <50% confined to bed  Review of systems- Review of Systems  Constitutional: Positive for malaise/fatigue. Negative for chills, fever and weight loss.  HENT: Negative for congestion, ear pain and tinnitus.   Eyes: Negative.  Negative for blurred vision and double vision.  Respiratory: Negative.  Negative for cough, sputum production and shortness of breath.   Cardiovascular: Negative.  Negative for chest pain, palpitations and leg swelling.  Gastrointestinal: Negative.  Negative for abdominal pain, constipation, diarrhea, nausea and vomiting.  Genitourinary: Negative for dysuria, frequency and urgency.  Musculoskeletal: Negative for back pain and falls.  Skin: Negative.  Negative for rash.  Neurological: Negative.  Negative for weakness and headaches.  Endo/Heme/Allergies: Negative.  Does not bruise/bleed easily.  Psychiatric/Behavioral: Negative.  Negative for depression. The patient is not nervous/anxious and does not have insomnia.      Current treatment-status post surgery, scheduled to begin a chemo with radiation this week  No Known Allergies  Past Medical History:  Diagnosis Date   Burping    Chronic airway obstruction, not elsewhere classified    Dyspnea    Dysrhythmia    Macular degeneration (senile) of retina, unspecified    Obstructive chronic bronchitis with exacerbation (HCC)    Osteoporosis, unspecified    Oxygen deficiency    Personal history of peptic ulcer disease    Tobacco use disorder    Unspecified  essential hypertension    Unspecified glaucoma(365.9)     Past Surgical History:  Procedure Laterality Date   CATARACT EXTRACTION W/ INTRAOCULAR LENS  IMPLANT,  BILATERAL     CYSTOSCOPY WITH STENT PLACEMENT Left 05/22/2019   Procedure: CYSTOSCOPY WITH STENT PLACEMENT;  Surgeon: Billey Co, MD;  Location: [facility] ORS;  Service: Urology;  Laterality: Left;   ELBOW FRACTURE SURGERY Left    TRANSURETHRAL RESECTION OF BLADDER TUMOR N/A 05/22/2019   Procedure: TRANSURETHRAL RESECTION OF BLADDER TUMOR (TURBT);  Surgeon: Billey Co, MD;  Location: [facility] ORS;  Service: Urology;  Laterality: N/A;    Social History   Socioeconomic History   Marital status: Married    Spouse name: Not on file   Number of children: 3   Years of education: Not on file   Highest education level: Not on file  Occupational History   Occupation: RETIRED    Employer: RETIRED  Social Designer, fashion/clothing strain: Not on file   Food insecurity    Worry: Not on file    Inability: Not on file   Transportation needs    Medical: Not on file    Non-medical: Not on file  Tobacco Use   Smoking status: Former Smoker    Packs/day: 1.30    Years: 45.00    Pack years: 58.50    Types: Cigarettes    Quit date: 08/14/2003    Years since quitting: 15.8   Smokeless tobacco: Never Used  Substance and Sexual Activity   Alcohol use: Yes    Alcohol/week: 14.0 standard drinks    Types: 14 Glasses of wine per week   Drug use: No   Sexual activity: Yes  Lifestyle   Physical activity    Days per week: Not on file    Minutes per session: Not on file   Stress: Not on file  Relationships   Social connections    Talks on phone: Not on file    Gets together: Not on file    Attends religious service: Not on file    Active member of club or organization: Not on file    Attends meetings of clubs or organizations: Not on file    Relationship status: Not on file   Intimate partner violence    Fear of current or ex partner: Not on file    Emotionally abused: Not on file    Physically abused: Not on file    Forced sexual activity: Not on file  Other Topics  Concern   Not on file  Social History Narrative   Tobacco abuse x 45 years    Family History  Adopted: Yes     Current Outpatient Medications:    acidophilus (RISAQUAD) CAPS capsule, Take 1 capsule by mouth daily., Disp: , Rfl:    albuterol (PROVENTIL HFA;VENTOLIN HFA) 108 (90 Base) MCG/ACT inhaler, Inhale 2 puffs into the lungs every 6 (six) hours as needed for wheezing or shortness of breath., Disp: 1 Inhaler, Rfl: 6   albuterol (PROVENTIL) (2.5 MG/3ML) 0.083% nebulizer solution, Take 3 mLs (2.5 mg total) by nebulization every 6 (six) hours as needed for wheezing or shortness of breath., Disp: 75 mL, Rfl: 12   Ascorbic Acid (VITAMIN C PO), Take 1,000 mg by mouth daily. , Disp: , Rfl:    aspirin 81 MG tablet, Take 81 mg by mouth at bedtime. , Disp: , Rfl:    Cholecalciferol (VITAMIN D) 125 MCG (5000 UT) CAPS, Take 5,000 Units by mouth  daily., Disp: , Rfl:    docusate sodium (COLACE) 100 MG capsule, Take 100 mg by mouth 2 (two) times daily., Disp: , Rfl:    hydroxypropyl methylcellulose / hypromellose (ISOPTO TEARS / GONIOVISC) 2.5 % ophthalmic solution, Place 1 drop into both eyes 3 (three) times daily as needed for dry eyes., Disp: , Rfl:    Lactobacillus-Inulin (PROBIOTIC DIGESTIVE SUPPORT PO), Take 1 capsule by mouth daily., Disp: , Rfl:    losartan (COZAAR) 50 MG tablet, TAKE 1 TABLET EVERY DAY (Patient taking differently: Take 50 mg by mouth daily. ), Disp: 90 tablet, Rfl: 3   Multiple Vitamins-Minerals (PRESERVISION AREDS) TABS, Take 1 tablet by mouth 2 (two) times daily., Disp: , Rfl:    mupirocin ointment (BACTROBAN) 2 %, Apply 1 application topically daily. , Disp: , Rfl:    Omega-3 Fatty Acids (FISH OIL) 1000 MG CAPS, Take 1,000 mg by mouth 2 (two) times daily. , Disp: , Rfl:    ondansetron (ZOFRAN) 8 MG tablet, Take 1 tablet (8 mg total) by mouth 2 (two) times daily as needed (Nausea or vomiting)., Disp: 30 tablet, Rfl: 1   OVER THE COUNTER MEDICATION, Take 1  tablet by mouth daily. Super Cleanser, Disp: , Rfl:    OXYGEN, Inhale 3.5 L into the lungs 3 (three) times daily as needed (shortness of breath)., Disp: , Rfl:    prochlorperazine (COMPAZINE) 10 MG tablet, Take 1 tablet (10 mg total) by mouth every 6 (six) hours as needed (Nausea or vomiting)., Disp: 30 tablet, Rfl: 1   silver sulfADIAZINE (SILVADENE) 1 % cream, Apply 1 application topically daily., Disp: 50 g, Rfl: 0   STIOLTO RESPIMAT 2.5-2.5 MCG/ACT AERS, INHALE 2 PUFFS INTO THE LUNGS DAILY., Disp: 12 g, Rfl: 1   triamcinolone cream (KENALOG) 0.1 %, Apply 1 application topically 3 (three) times a week., Disp: , Rfl:    verapamil (VERELAN PM) 360 MG 24 hr capsule, Take 360 mg by mouth daily. , Disp: , Rfl:    Vitamin E 180 MG CAPS, Take 180 mg by mouth daily., Disp: , Rfl:    zinc gluconate 50 MG tablet, Take 50 mg by mouth daily., Disp: , Rfl:   Physical exam: There were no vitals filed for this visit. Physical Exam Constitutional:      Appearance: Normal appearance.     Comments: In wheel chair  HENT:     Head: Normocephalic and atraumatic.  Eyes:     Pupils: Pupils are equal, round, and reactive to light.  Neck:     Musculoskeletal: Normal range of motion.  Cardiovascular:     Rate and Rhythm: Normal rate and regular rhythm.     Heart sounds: Normal heart sounds. No murmur.  Pulmonary:     Effort: Pulmonary effort is normal.     Breath sounds: Normal breath sounds. No wheezing.     Comments: On oxygen  Abdominal:     General: Bowel sounds are normal. There is no distension.     Palpations: Abdomen is soft.     Tenderness: There is no abdominal tenderness.  Musculoskeletal: Normal range of motion.  Skin:    General: Skin is warm and dry.     Findings: No rash.  Neurological:     Mental Status: She is alert and oriented to person, place, and time.  Psychiatric:        Judgment: Judgment normal.      CMP Latest Ref Rng & Units 06/10/2019  Glucose 65 - 99 mg/dL  98   BUN 8 - 27 mg/dL 15  Creatinine 0.57 - 1.00 mg/dL 0.98  Sodium 134 - 144 mmol/L 141  Potassium 3.5 - 5.2 mmol/L 4.9  Chloride 96 - 106 mmol/L 104  CO2 20 - 29 mmol/L 22  Calcium 8.7 - 10.3 mg/dL 10.1  Total Protein 6.0 - 8.5 g/dL 6.6  Total Bilirubin 0.0 - 1.2 mg/dL 0.4  Alkaline Phos 39 - 117 IU/L 88  AST 0 - 40 IU/L 21  ALT 0 - 32 IU/L 22   CBC Latest Ref Rng & Units 04/30/2019  WBC 3.4 - 10.8 x10E3/uL 6.5  Hemoglobin 11.1 - 15.9 g/dL 12.4  Hematocrit 34.0 - 46.6 % 37.1  Platelets 150 - 450 x10E3/uL 295    No images are attached to the encounter.  No results found.   Assessment and plan- Patient is a 85 y.o. female who presents to [facility] for initial meeting in preparation for starting chemotherapy for the treatment of stage II bladder cancer.   1. Cancer-stage II bladder cancer: Patient initially presented to Dr. Caprice Beaver for gross hematuria.  Work-up included a CT scan that revealed moderate left hydronephrosis with hydroureter with a intraluminal mass.  No lymphadenopathy in abdomen or pelvis no evidence of metastatic disease.  Patient had TURBT on 05/22/2019 and left ureteral stent.  Pathology revealed high-grade urothelial carcinoma with squamous differentiation.  Standard of care includes neoadjuvant chemo followed by cystectomy with lymph node dissection and urinary diversion.  Patient is adamant she does not want to undergo any surgery at this point.  Trimodality therapy with surgery, radiation and chemotherapy was discussed.  Multidisciplinary tumor board recommended trimodality therapy.   2. Chemo Care Clinic/High Risk for ER/Hospitalization during chemotherapy- We discussed the role of the chemo care clinic and identified patient specific risk factors. I discussed that patient was identified as high risk primarily based on her age and comorbidities.  Patient has a primary care provider who she sees often and she is covered through Medicare.  She lives at home with  her husband. Her past medical history is positive for: Past Medical History:  Diagnosis Date   Burping    Chronic airway obstruction, not elsewhere classified    Dyspnea    Dysrhythmia    Macular degeneration (senile) of retina, unspecified    Obstructive chronic bronchitis with exacerbation (HCC)    Osteoporosis, unspecified    Oxygen deficiency    Personal history of peptic ulcer disease    Tobacco use disorder    Unspecified essential hypertension    Unspecified glaucoma(365.9)     Her past surgical history is positive for: Past Surgical History:  Procedure Laterality Date   CATARACT EXTRACTION W/ INTRAOCULAR LENS  IMPLANT, BILATERAL     CYSTOSCOPY WITH STENT PLACEMENT Left 05/22/2019   Procedure: CYSTOSCOPY WITH STENT PLACEMENT;  Surgeon: Billey Co, MD;  Location: [facility] ORS;  Service: Urology;  Laterality: Left;   ELBOW FRACTURE SURGERY Left    TRANSURETHRAL RESECTION OF BLADDER TUMOR N/A 05/22/2019   Procedure: TRANSURETHRAL RESECTION OF BLADDER TUMOR (TURBT);  Surgeon: Billey Co, MD;  Location: [facility] ORS;  Service: Urology;  Laterality: N/A;    3. Social Determinants of Health- we discussed that social determinants of health may have significant impacts on health and outcomes for cancer patients.  Today we discussed specific social determinants of performance status, alcohol use, depression, financial needs, food insecurity, housing, interpersonal violence, social connections, stress, tobacco use, and transportation.  After lengthy discussion  the following were identified as areas of need may include stress and lack of social connections.  She already has a wheelchair provided for her at home.  She wears oxygen as needed. We discussed options for support groups at the cancer center. If interested, please notify nurse navigator to enroll.  We discussed options for managing stress including healthy eating, exercise as well as participating in no charge  counseling services at the cancer center and support groups.  If these are of interest, patient can notify either myself or primary nursing team.  4. Co-morbidities Complicating Care:  Past Medical History:  Diagnosis Date   Burping    Chronic airway obstruction, not elsewhere classified    Dyspnea    Dysrhythmia    Macular degeneration (senile) of retina, unspecified    Obstructive chronic bronchitis with exacerbation ([place])    Osteoporosis, unspecified    Oxygen deficiency    Personal history of peptic ulcer disease    Tobacco use disorder    Unspecified essential hypertension    Unspecified glaucoma(365.9)     We also discussed the role of the Symptom Management Clinic at [facility] for acute issues and methods of contacting clinic/provider. She denies needing specific assistance at this time and She will be followed by Janeann Merl, RN.   Patient and husband had multiple questions/concern about toxicity side effects of  chemotherapy and radiation given her age.  Patient states she may only be interested in radiation alone versus chemotherapy and radiation.  She would prefer to hold off on chemo at this time.  Information passed along to primary oncologist Dr. Tasia Catchings.  Dr. Tasia Catchings will speak with patient to further confirm.  Visit Diagnosis 1. Malignant neoplasm of posterior wall of urinary bladder (HCC)     Patient expressed understanding and was in agreement with this plan. She also understands that She can call clinic at any time with any questions, concerns, or complaints.   A total of (25) minutes of face-to-face time was spent with this patient with greater than 50% of that time in counseling and care-coordination.  Rulon Abide, NP, AGNP-C [place] at [facility] [phone] (work cell) [phone] (office)  CC: Dr. Tasia Catchings

## 2019-06-16 ENCOUNTER — Ambulatory Visit: Payer: Medicare HMO

## 2019-06-17 ENCOUNTER — Other Ambulatory Visit: Payer: Self-pay

## 2019-06-17 ENCOUNTER — Ambulatory Visit
Admission: RE | Admit: 2019-06-17 | Discharge: 2019-06-17 | Disposition: A | Discharge status: Home / Self Care | Payer: Medicare HMO | Source: Ambulatory Visit | Attending: Radiation Oncology | Admitting: Radiation Oncology

## 2019-06-17 DIAGNOSIS — C679 Malignant neoplasm of bladder, unspecified: Secondary | ICD-10-CM | POA: Insufficient documentation

## 2019-06-18 ENCOUNTER — Ambulatory Visit
Admission: RE | Admit: 2019-06-18 | Discharge: 2019-06-18 | Disposition: A | Discharge status: Home / Self Care | Payer: Medicare HMO | Source: Ambulatory Visit | Attending: Radiation Oncology | Admitting: Radiation Oncology

## 2019-06-18 ENCOUNTER — Inpatient Hospital Stay: Payer: Medicare HMO | Admitting: Oncology

## 2019-06-18 ENCOUNTER — Inpatient Hospital Stay: Payer: Medicare HMO

## 2019-06-18 ENCOUNTER — Other Ambulatory Visit: Payer: Self-pay

## 2019-06-18 DIAGNOSIS — C679 Malignant neoplasm of bladder, unspecified: Secondary | ICD-10-CM | POA: Diagnosis not present

## 2019-06-19 ENCOUNTER — Other Ambulatory Visit: Payer: Self-pay

## 2019-06-19 ENCOUNTER — Ambulatory Visit
Admission: RE | Admit: 2019-06-19 | Discharge: 2019-06-19 | Disposition: A | Discharge status: Home / Self Care | Payer: Medicare HMO | Source: Ambulatory Visit | Attending: Radiation Oncology | Admitting: Radiation Oncology

## 2019-06-19 DIAGNOSIS — C679 Malignant neoplasm of bladder, unspecified: Secondary | ICD-10-CM | POA: Diagnosis not present

## 2019-06-22 ENCOUNTER — Ambulatory Visit
Admission: RE | Admit: 2019-06-22 | Discharge: 2019-06-22 | Disposition: A | Discharge status: Home / Self Care | Payer: Medicare HMO | Source: Ambulatory Visit | Attending: Radiation Oncology | Admitting: Radiation Oncology

## 2019-06-22 ENCOUNTER — Other Ambulatory Visit: Payer: Self-pay

## 2019-06-22 DIAGNOSIS — C679 Malignant neoplasm of bladder, unspecified: Secondary | ICD-10-CM | POA: Diagnosis not present

## 2019-06-23 ENCOUNTER — Ambulatory Visit
Admission: RE | Admit: 2019-06-23 | Discharge: 2019-06-23 | Disposition: A | Discharge status: Home / Self Care | Payer: Medicare HMO | Source: Ambulatory Visit | Attending: Radiation Oncology | Admitting: Radiation Oncology

## 2019-06-23 ENCOUNTER — Other Ambulatory Visit: Payer: Self-pay

## 2019-06-23 ENCOUNTER — Other Ambulatory Visit: Payer: Self-pay | Admitting: *Deleted

## 2019-06-23 DIAGNOSIS — C679 Malignant neoplasm of bladder, unspecified: Secondary | ICD-10-CM | POA: Diagnosis not present

## 2019-06-24 ENCOUNTER — Ambulatory Visit
Admission: RE | Admit: 2019-06-24 | Discharge: 2019-06-24 | Disposition: A | Discharge status: Home / Self Care | Payer: Medicare HMO | Source: Ambulatory Visit | Attending: Radiation Oncology | Admitting: Radiation Oncology

## 2019-06-24 ENCOUNTER — Other Ambulatory Visit: Payer: Self-pay

## 2019-06-24 DIAGNOSIS — C679 Malignant neoplasm of bladder, unspecified: Secondary | ICD-10-CM | POA: Diagnosis not present

## 2019-06-25 ENCOUNTER — Other Ambulatory Visit: Payer: Self-pay

## 2019-06-25 ENCOUNTER — Ambulatory Visit
Admission: RE | Admit: 2019-06-25 | Discharge: 2019-06-25 | Disposition: A | Discharge status: Home / Self Care | Payer: Medicare HMO | Source: Ambulatory Visit | Attending: Radiation Oncology | Admitting: Radiation Oncology

## 2019-06-25 DIAGNOSIS — C679 Malignant neoplasm of bladder, unspecified: Secondary | ICD-10-CM | POA: Diagnosis not present

## 2019-06-26 ENCOUNTER — Ambulatory Visit
Admission: RE | Admit: 2019-06-26 | Discharge: 2019-06-26 | Disposition: A | Discharge status: Home / Self Care | Payer: Medicare HMO | Source: Ambulatory Visit | Attending: Radiation Oncology | Admitting: Radiation Oncology

## 2019-06-26 ENCOUNTER — Other Ambulatory Visit: Payer: Self-pay

## 2019-06-26 DIAGNOSIS — C679 Malignant neoplasm of bladder, unspecified: Secondary | ICD-10-CM | POA: Diagnosis not present

## 2019-06-29 ENCOUNTER — Ambulatory Visit
Admission: RE | Admit: 2019-06-29 | Discharge: 2019-06-29 | Disposition: A | Discharge status: Home / Self Care | Payer: Medicare HMO | Source: Ambulatory Visit | Attending: Radiation Oncology | Admitting: Radiation Oncology

## 2019-06-29 ENCOUNTER — Other Ambulatory Visit: Payer: Self-pay

## 2019-06-29 DIAGNOSIS — C679 Malignant neoplasm of bladder, unspecified: Secondary | ICD-10-CM | POA: Diagnosis not present

## 2019-06-30 ENCOUNTER — Other Ambulatory Visit: Payer: Self-pay

## 2019-06-30 ENCOUNTER — Ambulatory Visit
Admission: RE | Admit: 2019-06-30 | Discharge: 2019-06-30 | Disposition: A | Discharge status: Home / Self Care | Payer: Medicare HMO | Source: Ambulatory Visit | Attending: Radiation Oncology | Admitting: Radiation Oncology

## 2019-06-30 DIAGNOSIS — C679 Malignant neoplasm of bladder, unspecified: Secondary | ICD-10-CM | POA: Diagnosis not present

## 2019-07-01 ENCOUNTER — Other Ambulatory Visit: Payer: Self-pay

## 2019-07-01 ENCOUNTER — Ambulatory Visit
Admission: RE | Admit: 2019-07-01 | Discharge: 2019-07-01 | Disposition: A | Discharge status: Home / Self Care | Payer: Medicare HMO | Source: Ambulatory Visit | Attending: Radiation Oncology | Admitting: Radiation Oncology

## 2019-07-01 DIAGNOSIS — C679 Malignant neoplasm of bladder, unspecified: Secondary | ICD-10-CM | POA: Diagnosis not present

## 2019-07-02 ENCOUNTER — Ambulatory Visit
Admission: RE | Admit: 2019-07-02 | Discharge: 2019-07-02 | Disposition: A | Discharge status: Home / Self Care | Payer: Medicare HMO | Source: Ambulatory Visit | Attending: Radiation Oncology | Admitting: Radiation Oncology

## 2019-07-02 ENCOUNTER — Other Ambulatory Visit: Payer: Self-pay

## 2019-07-02 DIAGNOSIS — C679 Malignant neoplasm of bladder, unspecified: Secondary | ICD-10-CM | POA: Diagnosis not present

## 2019-07-03 ENCOUNTER — Ambulatory Visit
Admission: RE | Admit: 2019-07-03 | Discharge: 2019-07-03 | Disposition: A | Discharge status: Home / Self Care | Payer: Medicare HMO | Source: Ambulatory Visit | Attending: Radiation Oncology | Admitting: Radiation Oncology

## 2019-07-03 ENCOUNTER — Other Ambulatory Visit: Payer: Self-pay

## 2019-07-03 DIAGNOSIS — C679 Malignant neoplasm of bladder, unspecified: Secondary | ICD-10-CM | POA: Diagnosis not present

## 2019-07-06 ENCOUNTER — Ambulatory Visit
Admission: RE | Admit: 2019-07-06 | Discharge: 2019-07-06 | Disposition: A | Discharge status: Home / Self Care | Payer: Medicare HMO | Source: Ambulatory Visit | Attending: Radiation Oncology | Admitting: Radiation Oncology

## 2019-07-06 ENCOUNTER — Other Ambulatory Visit: Payer: Self-pay

## 2019-07-06 DIAGNOSIS — C679 Malignant neoplasm of bladder, unspecified: Secondary | ICD-10-CM | POA: Diagnosis not present

## 2019-07-07 ENCOUNTER — Ambulatory Visit
Admission: RE | Admit: 2019-07-07 | Discharge: 2019-07-07 | Disposition: A | Discharge status: Home / Self Care | Payer: Medicare HMO | Source: Ambulatory Visit | Attending: Radiation Oncology | Admitting: Radiation Oncology

## 2019-07-07 ENCOUNTER — Other Ambulatory Visit: Payer: Self-pay

## 2019-07-07 DIAGNOSIS — C679 Malignant neoplasm of bladder, unspecified: Secondary | ICD-10-CM | POA: Diagnosis not present

## 2019-07-08 ENCOUNTER — Ambulatory Visit
Admission: RE | Admit: 2019-07-08 | Discharge: 2019-07-08 | Disposition: A | Discharge status: Home / Self Care | Payer: Medicare HMO | Source: Ambulatory Visit | Attending: Radiation Oncology | Admitting: Radiation Oncology

## 2019-07-08 ENCOUNTER — Other Ambulatory Visit: Payer: Self-pay

## 2019-07-08 DIAGNOSIS — C679 Malignant neoplasm of bladder, unspecified: Secondary | ICD-10-CM | POA: Diagnosis not present

## 2019-07-13 ENCOUNTER — Ambulatory Visit
Admission: RE | Admit: 2019-07-13 | Discharge: 2019-07-13 | Disposition: A | Discharge status: Home / Self Care | Payer: Medicare HMO | Source: Ambulatory Visit | Attending: Radiation Oncology | Admitting: Radiation Oncology

## 2019-07-13 ENCOUNTER — Other Ambulatory Visit: Payer: Self-pay

## 2019-07-13 DIAGNOSIS — C679 Malignant neoplasm of bladder, unspecified: Secondary | ICD-10-CM | POA: Diagnosis not present

## 2019-07-13 DIAGNOSIS — Z510 Encounter for antineoplastic radiation therapy: Secondary | ICD-10-CM | POA: Diagnosis not present

## 2019-07-14 ENCOUNTER — Other Ambulatory Visit: Payer: Self-pay

## 2019-07-14 ENCOUNTER — Ambulatory Visit
Admission: RE | Admit: 2019-07-14 | Discharge: 2019-07-14 | Disposition: A | Discharge status: Home / Self Care | Payer: Medicare HMO | Source: Ambulatory Visit | Attending: Radiation Oncology | Admitting: Radiation Oncology

## 2019-07-14 DIAGNOSIS — C679 Malignant neoplasm of bladder, unspecified: Secondary | ICD-10-CM | POA: Insufficient documentation

## 2019-07-14 DIAGNOSIS — Z510 Encounter for antineoplastic radiation therapy: Secondary | ICD-10-CM | POA: Diagnosis not present

## 2019-07-15 ENCOUNTER — Other Ambulatory Visit: Payer: Self-pay

## 2019-07-15 ENCOUNTER — Ambulatory Visit
Admission: RE | Admit: 2019-07-15 | Discharge: 2019-07-15 | Disposition: A | Discharge status: Home / Self Care | Payer: Medicare HMO | Source: Ambulatory Visit | Attending: Radiation Oncology | Admitting: Radiation Oncology

## 2019-07-15 DIAGNOSIS — Z510 Encounter for antineoplastic radiation therapy: Secondary | ICD-10-CM | POA: Diagnosis not present

## 2019-07-15 DIAGNOSIS — C679 Malignant neoplasm of bladder, unspecified: Secondary | ICD-10-CM | POA: Diagnosis not present

## 2019-07-16 ENCOUNTER — Other Ambulatory Visit: Payer: Self-pay

## 2019-07-16 ENCOUNTER — Ambulatory Visit
Admission: RE | Admit: 2019-07-16 | Discharge: 2019-07-16 | Disposition: A | Discharge status: Home / Self Care | Payer: Medicare HMO | Source: Ambulatory Visit | Attending: Radiation Oncology | Admitting: Radiation Oncology

## 2019-07-16 DIAGNOSIS — Z510 Encounter for antineoplastic radiation therapy: Secondary | ICD-10-CM | POA: Diagnosis not present

## 2019-07-16 DIAGNOSIS — C679 Malignant neoplasm of bladder, unspecified: Secondary | ICD-10-CM | POA: Diagnosis not present

## 2019-07-17 ENCOUNTER — Other Ambulatory Visit: Payer: Self-pay

## 2019-07-17 ENCOUNTER — Ambulatory Visit
Admission: RE | Admit: 2019-07-17 | Discharge: 2019-07-17 | Disposition: A | Discharge status: Home / Self Care | Payer: Medicare HMO | Source: Ambulatory Visit | Attending: Radiation Oncology | Admitting: Radiation Oncology

## 2019-07-17 DIAGNOSIS — Z510 Encounter for antineoplastic radiation therapy: Secondary | ICD-10-CM | POA: Diagnosis not present

## 2019-07-17 DIAGNOSIS — C679 Malignant neoplasm of bladder, unspecified: Secondary | ICD-10-CM | POA: Diagnosis not present

## 2019-07-20 ENCOUNTER — Other Ambulatory Visit: Payer: Self-pay

## 2019-07-20 ENCOUNTER — Ambulatory Visit
Admission: RE | Admit: 2019-07-20 | Discharge: 2019-07-20 | Disposition: A | Discharge status: Home / Self Care | Payer: Medicare HMO | Source: Ambulatory Visit | Attending: Radiation Oncology | Admitting: Radiation Oncology

## 2019-07-20 DIAGNOSIS — C679 Malignant neoplasm of bladder, unspecified: Secondary | ICD-10-CM | POA: Diagnosis not present

## 2019-07-20 DIAGNOSIS — Z510 Encounter for antineoplastic radiation therapy: Secondary | ICD-10-CM | POA: Diagnosis not present

## 2019-07-21 ENCOUNTER — Ambulatory Visit
Admission: RE | Admit: 2019-07-21 | Discharge: 2019-07-21 | Disposition: A | Discharge status: Home / Self Care | Payer: Medicare HMO | Source: Ambulatory Visit | Attending: Radiation Oncology | Admitting: Radiation Oncology

## 2019-07-21 ENCOUNTER — Other Ambulatory Visit: Payer: Self-pay

## 2019-07-21 DIAGNOSIS — Z510 Encounter for antineoplastic radiation therapy: Secondary | ICD-10-CM | POA: Diagnosis not present

## 2019-07-21 DIAGNOSIS — C679 Malignant neoplasm of bladder, unspecified: Secondary | ICD-10-CM | POA: Diagnosis not present

## 2019-07-22 ENCOUNTER — Ambulatory Visit
Admission: RE | Admit: 2019-07-22 | Discharge: 2019-07-22 | Disposition: A | Discharge status: Home / Self Care | Payer: Medicare HMO | Source: Ambulatory Visit | Attending: Radiation Oncology | Admitting: Radiation Oncology

## 2019-07-22 ENCOUNTER — Other Ambulatory Visit: Payer: Self-pay

## 2019-07-22 DIAGNOSIS — C679 Malignant neoplasm of bladder, unspecified: Secondary | ICD-10-CM | POA: Diagnosis not present

## 2019-07-22 DIAGNOSIS — Z510 Encounter for antineoplastic radiation therapy: Secondary | ICD-10-CM | POA: Diagnosis not present

## 2019-07-23 ENCOUNTER — Ambulatory Visit
Admission: RE | Admit: 2019-07-23 | Discharge: 2019-07-23 | Disposition: A | Discharge status: Home / Self Care | Payer: Medicare HMO | Source: Ambulatory Visit | Attending: Radiation Oncology | Admitting: Radiation Oncology

## 2019-07-23 ENCOUNTER — Other Ambulatory Visit: Payer: Self-pay

## 2019-07-23 DIAGNOSIS — Z510 Encounter for antineoplastic radiation therapy: Secondary | ICD-10-CM | POA: Diagnosis not present

## 2019-07-23 DIAGNOSIS — C679 Malignant neoplasm of bladder, unspecified: Secondary | ICD-10-CM | POA: Diagnosis not present

## 2019-07-24 ENCOUNTER — Other Ambulatory Visit: Payer: Self-pay

## 2019-07-24 ENCOUNTER — Ambulatory Visit
Admission: RE | Admit: 2019-07-24 | Discharge: 2019-07-24 | Disposition: A | Discharge status: Home / Self Care | Payer: Medicare HMO | Source: Ambulatory Visit | Attending: Radiation Oncology | Admitting: Radiation Oncology

## 2019-07-24 ENCOUNTER — Ambulatory Visit: Payer: Medicare HMO

## 2019-07-24 DIAGNOSIS — Z510 Encounter for antineoplastic radiation therapy: Secondary | ICD-10-CM | POA: Diagnosis not present

## 2019-07-24 DIAGNOSIS — C679 Malignant neoplasm of bladder, unspecified: Secondary | ICD-10-CM | POA: Diagnosis not present

## 2019-07-27 ENCOUNTER — Other Ambulatory Visit: Payer: Self-pay

## 2019-07-27 ENCOUNTER — Ambulatory Visit
Admission: RE | Admit: 2019-07-27 | Discharge: 2019-07-27 | Disposition: A | Discharge status: Home / Self Care | Payer: Medicare HMO | Source: Ambulatory Visit | Attending: Radiation Oncology | Admitting: Radiation Oncology

## 2019-07-27 DIAGNOSIS — Z510 Encounter for antineoplastic radiation therapy: Secondary | ICD-10-CM | POA: Diagnosis not present

## 2019-07-27 DIAGNOSIS — C679 Malignant neoplasm of bladder, unspecified: Secondary | ICD-10-CM | POA: Diagnosis not present

## 2019-07-28 ENCOUNTER — Ambulatory Visit
Admission: RE | Admit: 2019-07-28 | Discharge: 2019-07-28 | Disposition: A | Discharge status: Home / Self Care | Payer: Medicare HMO | Source: Ambulatory Visit | Attending: Radiation Oncology | Admitting: Radiation Oncology

## 2019-07-28 ENCOUNTER — Other Ambulatory Visit: Payer: Self-pay

## 2019-07-28 DIAGNOSIS — Z510 Encounter for antineoplastic radiation therapy: Secondary | ICD-10-CM | POA: Diagnosis not present

## 2019-07-28 DIAGNOSIS — C679 Malignant neoplasm of bladder, unspecified: Secondary | ICD-10-CM | POA: Diagnosis not present

## 2019-07-29 ENCOUNTER — Other Ambulatory Visit: Payer: Self-pay

## 2019-07-29 ENCOUNTER — Ambulatory Visit
Admission: RE | Admit: 2019-07-29 | Discharge: 2019-07-29 | Disposition: A | Discharge status: Home / Self Care | Payer: Medicare HMO | Source: Ambulatory Visit | Attending: Radiation Oncology | Admitting: Radiation Oncology

## 2019-07-29 DIAGNOSIS — C679 Malignant neoplasm of bladder, unspecified: Secondary | ICD-10-CM | POA: Diagnosis not present

## 2019-07-29 DIAGNOSIS — Z510 Encounter for antineoplastic radiation therapy: Secondary | ICD-10-CM | POA: Diagnosis not present

## 2019-07-30 ENCOUNTER — Ambulatory Visit
Admission: RE | Admit: 2019-07-30 | Discharge: 2019-07-30 | Disposition: A | Discharge status: Home / Self Care | Payer: Medicare HMO | Source: Ambulatory Visit | Attending: Radiation Oncology | Admitting: Radiation Oncology

## 2019-07-30 ENCOUNTER — Other Ambulatory Visit: Payer: Self-pay

## 2019-07-30 DIAGNOSIS — C679 Malignant neoplasm of bladder, unspecified: Secondary | ICD-10-CM | POA: Diagnosis not present

## 2019-07-30 DIAGNOSIS — Z510 Encounter for antineoplastic radiation therapy: Secondary | ICD-10-CM | POA: Diagnosis not present

## 2019-07-31 ENCOUNTER — Other Ambulatory Visit: Payer: Self-pay

## 2019-07-31 ENCOUNTER — Ambulatory Visit
Admission: RE | Admit: 2019-07-31 | Discharge: 2019-07-31 | Disposition: A | Discharge status: Home / Self Care | Payer: Medicare HMO | Source: Ambulatory Visit | Attending: Radiation Oncology | Admitting: Radiation Oncology

## 2019-07-31 DIAGNOSIS — Z510 Encounter for antineoplastic radiation therapy: Secondary | ICD-10-CM | POA: Diagnosis not present

## 2019-07-31 DIAGNOSIS — C679 Malignant neoplasm of bladder, unspecified: Secondary | ICD-10-CM | POA: Diagnosis not present

## 2019-08-03 ENCOUNTER — Ambulatory Visit
Admission: RE | Admit: 2019-08-03 | Discharge: 2019-08-03 | Disposition: A | Discharge status: Home / Self Care | Payer: Medicare HMO | Source: Ambulatory Visit | Attending: Radiation Oncology | Admitting: Radiation Oncology

## 2019-08-03 ENCOUNTER — Other Ambulatory Visit: Payer: Self-pay

## 2019-08-03 ENCOUNTER — Other Ambulatory Visit: Payer: Self-pay | Admitting: *Deleted

## 2019-08-03 DIAGNOSIS — Z510 Encounter for antineoplastic radiation therapy: Secondary | ICD-10-CM | POA: Diagnosis not present

## 2019-08-03 DIAGNOSIS — C679 Malignant neoplasm of bladder, unspecified: Secondary | ICD-10-CM | POA: Diagnosis not present

## 2019-08-03 MED ORDER — MIRABEGRON ER 25 MG PO TB24: 25.0000 mg | ORAL_TABLET | Freq: Every day | ORAL | 5 refills | Status: DC

## 2019-08-04 ENCOUNTER — Other Ambulatory Visit: Payer: Self-pay

## 2019-08-04 ENCOUNTER — Ambulatory Visit
Admission: RE | Admit: 2019-08-04 | Discharge: 2019-08-04 | Disposition: A | Discharge status: Home / Self Care | Payer: Medicare HMO | Source: Ambulatory Visit | Attending: Radiation Oncology | Admitting: Radiation Oncology

## 2019-08-04 DIAGNOSIS — Z510 Encounter for antineoplastic radiation therapy: Secondary | ICD-10-CM | POA: Diagnosis not present

## 2019-08-04 DIAGNOSIS — C679 Malignant neoplasm of bladder, unspecified: Secondary | ICD-10-CM | POA: Diagnosis not present

## 2019-08-05 ENCOUNTER — Ambulatory Visit
Admission: RE | Admit: 2019-08-05 | Discharge: 2019-08-05 | Disposition: A | Discharge status: Home / Self Care | Payer: Medicare HMO | Source: Ambulatory Visit | Attending: Radiation Oncology | Admitting: Radiation Oncology

## 2019-08-05 ENCOUNTER — Other Ambulatory Visit: Payer: Self-pay

## 2019-08-05 DIAGNOSIS — C679 Malignant neoplasm of bladder, unspecified: Secondary | ICD-10-CM | POA: Diagnosis not present

## 2019-08-05 DIAGNOSIS — Z510 Encounter for antineoplastic radiation therapy: Secondary | ICD-10-CM | POA: Diagnosis not present

## 2019-08-06 ENCOUNTER — Other Ambulatory Visit: Payer: Self-pay

## 2019-08-06 ENCOUNTER — Ambulatory Visit
Admission: RE | Admit: 2019-08-06 | Discharge: 2019-08-06 | Disposition: A | Discharge status: Home / Self Care | Payer: Medicare HMO | Source: Ambulatory Visit | Attending: Radiation Oncology | Admitting: Radiation Oncology

## 2019-08-06 DIAGNOSIS — Z510 Encounter for antineoplastic radiation therapy: Secondary | ICD-10-CM | POA: Diagnosis not present

## 2019-08-06 DIAGNOSIS — C679 Malignant neoplasm of bladder, unspecified: Secondary | ICD-10-CM | POA: Diagnosis not present

## 2019-08-10 ENCOUNTER — Other Ambulatory Visit: Payer: Self-pay

## 2019-08-10 ENCOUNTER — Ambulatory Visit
Admission: RE | Admit: 2019-08-10 | Discharge: 2019-08-10 | Disposition: A | Discharge status: Home / Self Care | Payer: Medicare HMO | Source: Ambulatory Visit | Attending: Radiation Oncology | Admitting: Radiation Oncology

## 2019-08-10 DIAGNOSIS — Z510 Encounter for antineoplastic radiation therapy: Secondary | ICD-10-CM | POA: Diagnosis not present

## 2019-08-10 DIAGNOSIS — C679 Malignant neoplasm of bladder, unspecified: Secondary | ICD-10-CM | POA: Diagnosis not present

## 2019-08-11 ENCOUNTER — Ambulatory Visit
Admission: RE | Admit: 2019-08-11 | Discharge: 2019-08-11 | Disposition: A | Discharge status: Home / Self Care | Payer: Medicare HMO | Source: Ambulatory Visit | Attending: Radiation Oncology | Admitting: Radiation Oncology

## 2019-08-11 ENCOUNTER — Other Ambulatory Visit: Payer: Self-pay

## 2019-08-11 DIAGNOSIS — C679 Malignant neoplasm of bladder, unspecified: Secondary | ICD-10-CM | POA: Diagnosis not present

## 2019-08-11 DIAGNOSIS — Z510 Encounter for antineoplastic radiation therapy: Secondary | ICD-10-CM | POA: Diagnosis not present

## 2019-08-21 ENCOUNTER — Other Ambulatory Visit: Payer: Self-pay

## 2019-08-21 ENCOUNTER — Ambulatory Visit: Payer: Medicare HMO | Admitting: Urology

## 2019-08-21 VITALS — BP 161/97 | HR 111 | Ht 60.0 in | Wt 101.0 lb

## 2019-08-21 DIAGNOSIS — C679 Malignant neoplasm of bladder, unspecified: Secondary | ICD-10-CM

## 2019-08-21 DIAGNOSIS — R310 Gross hematuria: Secondary | ICD-10-CM | POA: Diagnosis not present

## 2019-08-21 LAB — URINALYSIS, COMPLETE
Bilirubin, UA: NEGATIVE
Glucose, UA: NEGATIVE
Ketones, UA: NEGATIVE
Nitrite, UA: NEGATIVE
Specific Gravity, UA: 1.015 (ref 1.005–1.030)
Urobilinogen, Ur: 0.2 mg/dL (ref 0.2–1.0)
pH, UA: 6 (ref 5.0–7.5)

## 2019-08-21 LAB — BLADDER SCAN AMB NON-IMAGING: Scan Result: 10

## 2019-08-21 LAB — MICROSCOPIC EXAMINATION

## 2019-08-21 MED ORDER — NITROFURANTOIN MONOHYD MACRO 100 MG PO CAPS: 100.0000 mg | ORAL_CAPSULE | Freq: Two times a day (BID) | ORAL | 0 refills | Status: DC

## 2019-08-21 NOTE — Patient Instructions (Addendum)
1. Take pyridium OTC as needed for burning 2. Continue to take cranberry tablets daily  3. Continue miralax for constipation  Call the clinic or present to the ED if fever over 101.3.

## 2019-08-21 NOTE — Progress Notes (Signed)
   08/21/2019 1:38 PM   Misty Ferguson 02/18/1934 [id number]  Reason for visit: Dysuria  HPI: I saw Misty Ferguson as an add-on in urology clinic today for dysuria.  She is a very nice 86 year old female with history of oxygen dependent COPD who presented in October 2020 with gross hematuria and was found to have a 4 cm bladder mass and left hydronephrosis on CT urogram.  She underwent a TURBT and left ureteral stent placement on 05/22/2019.  Final pathology showed high-grade muscle invasive urothelial cell carcinoma with squamous differentiation.  On her staging imaging she had a subtle nodular density in the left upper lobe on a chest x-ray, but she refused further chest CT. She refused cystectomy as well as any chemotherapy, but completed external beam radiation in the end of December 2020.    She reports very bothersome urinary frequency including nocturia 6-8 times per night over the last few weeks as well as dysuria, especially at the end of her stream.  Misty Ferguson denies any incontinence.  She is also been bothered by severe constipation.  She was tried on Myrbetriq by Dr. Donella Stade without any improvement in her urinary symptoms.  She denies any fevers, chills, gross hematuria, pelvic pain, or flank pain.  Urinalysis today is equivocal with 11-30 WBCs, 3-10 RBCs, few bacteria, no yeast, nitrite negative, 1+ leukocytes.  Will send for culture.  PVR 10 mL.  We discussed at length possible causes of her urinary symptoms including side effects from radiation, side effects from her left ureteral stent(however she did not have any symptoms for the first 2 months of having the stent, and only started during the last week of radiation), UTI, or tumor recurrence.  Recommendations:  -Nitrofurantoin 100 mg twice daily x5 days for possible UTI, follow-up urine culture -Uribel and Pyridium as needed for dysuria -Continue bowel regimen -RTC for surveillance cystoscopy 09/10/2019-will discuss repeat cross-sectional  imaging pending cystoscopy findings -Tentatively plan for left ureteral stent exchange in ~6 months (currently has a Misty Ferguson stent that typically can remain in 9 to 12 month before needing exchange)  A total of 20 minutes were spent face-to-face with the patient, greater than 50% was spent in patient education, counseling, and coordination of care regarding bladder cancer and dysuria.  Billey Co, [facility] [address], [place], [place] [address] [phone]

## 2019-08-24 ENCOUNTER — Ambulatory Visit: Payer: Medicare HMO | Admitting: Urology

## 2019-08-24 LAB — CULTURE, URINE COMPREHENSIVE

## 2019-08-25 DIAGNOSIS — J441 Chronic obstructive pulmonary disease with (acute) exacerbation: Secondary | ICD-10-CM | POA: Diagnosis not present

## 2019-08-25 DIAGNOSIS — J432 Centrilobular emphysema: Secondary | ICD-10-CM | POA: Diagnosis not present

## 2019-08-25 DIAGNOSIS — R0602 Shortness of breath: Secondary | ICD-10-CM | POA: Diagnosis not present

## 2019-08-25 DIAGNOSIS — Z87898 Personal history of other specified conditions: Secondary | ICD-10-CM | POA: Diagnosis not present

## 2019-08-25 DIAGNOSIS — J9611 Chronic respiratory failure with hypoxia: Secondary | ICD-10-CM | POA: Diagnosis not present

## 2019-08-26 ENCOUNTER — Telehealth (INDEPENDENT_AMBULATORY_CARE_PROVIDER_SITE_OTHER): Payer: Medicare HMO | Admitting: Urology

## 2019-08-26 ENCOUNTER — Other Ambulatory Visit: Payer: Self-pay

## 2019-08-26 DIAGNOSIS — C679 Malignant neoplasm of bladder, unspecified: Secondary | ICD-10-CM

## 2019-08-26 NOTE — Progress Notes (Signed)
Virtual Visit via Telephone Note  I connected with Misty Ferguson on 08/26/19 at 11:45 AM EST by telephone and verified that I am speaking with the correct person using two identifiers.   I discussed the limitations, risks, security and privacy concerns of performing an evaluation and management service by telephone and the availability of in person appointments. We discussed the impact of the COVID-19 on the healthcare system, and the importance of social distancing and reducing patient and provider exposure. I also discussed with the patient that there may be a patient responsible charge related to this service. The patient expressed understanding and agreed to proceed.  Reason for visit: Urinary frequency, dysuria  History of Present Illness: I had phone follow-up with Ms. Bibby today regarding her urinary symptoms.  To briefly summarize, she is an 86 year old female with muscle invasive bladder cancer being treated with maximal TURBT and external beam radiation.  She has a left ureteral stent in place.  She refused chemotherapy or cystectomy.  I saw her on 08/21/2019 for severe dysuria and urinary frequency and nocturia that started after her last few sessions of radiation.  She did not have any of the symptoms during her first 6 weeks after surgery and stent placement.  Urine culture ultimately showed 400 colonies mixed urogenital flora.  She has noted some improvement with over-the-counter Pyridium, and overall thinks her symptoms have improved slowly over the last week or 2.  We discussed possible causes including side effects from radiation, stent irritation, or recurrence of tumor.  Keep scheduled follow-up in 2 weeks for for surveillance cystoscopy Will obtain recent outside CT imaging from Dr. Olena Leatherwood office     I discussed the assessment and treatment plan with the patient. The patient was provided an opportunity to ask questions and all were answered. The patient agreed with the  plan and demonstrated an understanding of the instructions.   The patient was advised to call back or seek an in-person evaluation if the symptoms worsen or if the condition fails to improve as anticipated.   Billey Co, MD

## 2019-09-10 ENCOUNTER — Encounter: Payer: Self-pay | Admitting: Urology

## 2019-09-10 ENCOUNTER — Other Ambulatory Visit: Payer: Self-pay

## 2019-09-10 ENCOUNTER — Ambulatory Visit: Payer: Medicare HMO | Admitting: Urology

## 2019-09-10 VITALS — BP 179/97 | HR 86 | Ht 60.0 in | Wt 100.0 lb

## 2019-09-10 DIAGNOSIS — C679 Malignant neoplasm of bladder, unspecified: Secondary | ICD-10-CM | POA: Diagnosis not present

## 2019-09-10 DIAGNOSIS — R896 Abnormal cytological findings in specimens from other organs, systems and tissues: Secondary | ICD-10-CM | POA: Diagnosis not present

## 2019-09-10 NOTE — Progress Notes (Signed)
Cystoscopy Procedure Note:  Indication: Muscle invasive Bladder cancer  Brief summary: -Presented 04/2019 with gross hematuria, CTU showed 3cm left sided bladder mass with left hydronephrosis, sCr 1.46(eGFR33). Refused chest CT. -TURBT 05/22/2019 with resection of all visible disease(HG T2 urothelial cell carcinoma with squamous differentiation), left ureteral stent placement. Renal function improved after stent to 0.98(eGFR 54) -Radiation to bladder and pelvic nodes with Dr. Baruch Gouty completed 07/2019, she refused concurrent chemotherapy   After informed consent and discussion of the procedure and its risks, Misty Ferguson was positioned and prepped in the standard fashion. Cystoscopy was performed with a flexible cystoscope. The urethra, bladder neck and entire bladder was visualized in a standard fashion.  There was minimal encrustation on the left ureteral stent.  The bladder overall looked very normal and there were no frank tumors or bladder lesions.  There is some very subtle erythema around the left ureteral orifice and stent that could represent stent irritation, radiation treatment effect, or early recurrence.  Cytology sent.  Findings: No frank bladder tumors, subtle erythema around the left ureteral orifice and stent possibly representing stent irritation, radiation effects, or early tumor recurrence  Assessment and Plan: We will call with cytology and CT chest/abdomen/pelvis results If cytology and CT negative, likely plan for retrograde pyelogram and stent removal versus exchange in ~2 months If cytology positive/concerning, pursue cystoscopy and bladder biopsy, retrograde pyelogram, stent change/removal If CT positive for metastatic disease will re-refer to oncology to discuss any other systemic treatment options  Nickolas Madrid, MD 09/10/2019

## 2019-09-10 NOTE — Addendum Note (Signed)
Addended by: Billey Co on: 09/10/2019 10:51 AM   Modules accepted: Orders

## 2019-09-11 LAB — URINALYSIS, COMPLETE
Bilirubin, UA: NEGATIVE
Glucose, UA: NEGATIVE
Ketones, UA: NEGATIVE
Nitrite, UA: NEGATIVE
Specific Gravity, UA: 1.02 (ref 1.005–1.030)
Urobilinogen, Ur: 0.2 mg/dL (ref 0.2–1.0)
pH, UA: 5.5 (ref 5.0–7.5)

## 2019-09-11 LAB — MICROSCOPIC EXAMINATION
Bacteria, UA: NONE SEEN
RBC, Urine: >30 /hpf — AB (ref 0–2)

## 2019-09-17 ENCOUNTER — Other Ambulatory Visit: Payer: Self-pay | Admitting: Urology

## 2019-09-17 ENCOUNTER — Ambulatory Visit: Payer: Medicare HMO | Admitting: Radiation Oncology

## 2019-09-21 ENCOUNTER — Telehealth: Payer: Self-pay | Admitting: Internal Medicine

## 2019-09-21 NOTE — Telephone Encounter (Signed)
Received called from [place] from main clinic supply, who is requesting documentation stating that pt is on oxygen.  Last office visit note has been faxed to [phone] as requested by [facility].  Received fax confirmation.  Nothing further is needed.

## 2019-09-21 NOTE — Telephone Encounter (Signed)
Made patients husband aware that we would look out for any forms sent and take care of it.

## 2019-09-23 ENCOUNTER — Other Ambulatory Visit: Payer: Self-pay

## 2019-09-23 ENCOUNTER — Ambulatory Visit
Admission: RE | Admit: 2019-09-23 | Discharge: 2019-09-23 | Disposition: A | Discharge status: Home / Self Care | Payer: Medicare HMO | Source: Ambulatory Visit | Attending: Urology | Admitting: Urology

## 2019-09-23 DIAGNOSIS — J439 Emphysema, unspecified: Secondary | ICD-10-CM | POA: Diagnosis not present

## 2019-09-23 DIAGNOSIS — I700 Atherosclerosis of aorta: Secondary | ICD-10-CM | POA: Diagnosis not present

## 2019-09-23 DIAGNOSIS — C679 Malignant neoplasm of bladder, unspecified: Secondary | ICD-10-CM | POA: Insufficient documentation

## 2019-09-23 LAB — POCT I-STAT CREATININE: Creatinine, Ser: 0.9 mg/dL (ref 0.44–1.00)

## 2019-09-23 MED ORDER — IOHEXOL 300 MG/ML  SOLN
85.0000 mL | Freq: Once | INTRAMUSCULAR | Status: AC | PRN
  Administered 2019-09-23: 85 mL via INTRAVENOUS

## 2019-09-24 ENCOUNTER — Telehealth: Payer: Self-pay | Admitting: Urology

## 2019-09-24 NOTE — Telephone Encounter (Signed)
done

## 2019-09-24 NOTE — Telephone Encounter (Signed)
Urology telephone note  86 year old female with COPD on oxygen who presented in September with gross hematuria and was found to have a 3 cm left-sided bladder mass with left-sided hydronephrosis and eGFR of 33.  She underwent a complete TURBT on 05/22/2019 that showed high-grade muscle invasive urothelial cell carcinoma with squamous differentiation with left ureteral stent placement.  She refused cystectomy and elected for radiation but also refused concurrent chemotherapy.  First surveillance imaging 09/23/2019 with CT chest/abdomen/pelvis showed no evidence of recurrence.  There was no tumor on clinic cystoscopy, and cytology was atypical.  We discussed options at length regarding her left ureteral stent including proceeding to the OR for removal and retrograde pyelogram and possible stent replacement, for stent removal in clinic and close observation to evaluate for recurrent obstruction.  We discussed the risks and benefits at length, and she would like to have her stent removed in clinic and consider a follow-up renal ultrasound.  I reviewed at length with her the high risk of recurrence and recommendations for surveillance imaging and cystoscopy every 3 to 4 months, but she is adamant that she does not want close surveillance as she reports she would not seek any other treatment even if her disease recurs.  We will discuss further in clinic in 3 weeks after stent removal.  Stent removal in clinic in 3 weeks Recommend ultrasound 4 to 6 weeks after stent removal to confirm no residual hydronephrosis Well work with patient to determine surveillance imaging/cystoscopy schedule with her desire for less frequent monitoring  Nickolas Madrid, MD 09/24/2019

## 2019-10-12 ENCOUNTER — Telehealth: Payer: Self-pay | Admitting: *Deleted

## 2019-10-12 NOTE — Telephone Encounter (Signed)
Per Dr. Tasia Catchings to schedule pt for lab/MD the week of 3/8  I called pt with scheduled appt date and pt refused any appts there with Dr.Yu.. Pt stated that if there's anything anyone needs to know to call Dr. Georgiann Mohs he's taking care or everything. MD aware

## 2019-10-14 ENCOUNTER — Other Ambulatory Visit: Payer: Self-pay

## 2019-10-14 ENCOUNTER — Ambulatory Visit (INDEPENDENT_AMBULATORY_CARE_PROVIDER_SITE_OTHER): Payer: Medicare HMO | Admitting: Urology

## 2019-10-14 ENCOUNTER — Encounter: Payer: Self-pay | Admitting: Urology

## 2019-10-14 VITALS — BP 167/89 | HR 96 | Ht 61.0 in | Wt 101.0 lb

## 2019-10-14 DIAGNOSIS — C679 Malignant neoplasm of bladder, unspecified: Secondary | ICD-10-CM

## 2019-10-14 DIAGNOSIS — Z466 Encounter for fitting and adjustment of urinary device: Secondary | ICD-10-CM

## 2019-10-14 LAB — MICROSCOPIC EXAMINATION: WBC, UA: >30 /hpf — AB (ref 0–5)

## 2019-10-14 LAB — URINALYSIS, COMPLETE
Bilirubin, UA: NEGATIVE
Glucose, UA: NEGATIVE
Ketones, UA: NEGATIVE
Nitrite, UA: NEGATIVE
Specific Gravity, UA: >1.03 — ABNORMAL HIGH (ref 1.005–1.030)
Urobilinogen, Ur: 0.2 mg/dL (ref 0.2–1.0)
pH, UA: 7 (ref 5.0–7.5)

## 2019-10-14 MED ORDER — LIDOCAINE HCL URETHRAL/MUCOSAL 2 % EX GEL
1.0000 "application " | Freq: Once | CUTANEOUS | Status: AC
  Administered 2019-10-14: 1 via URETHRAL

## 2019-10-14 MED ORDER — CIPROFLOXACIN HCL 500 MG PO TABS
500.0000 mg | ORAL_TABLET | Freq: Once | ORAL | Status: AC
  Administered 2019-10-14: 500 mg via ORAL

## 2019-10-14 NOTE — Patient Instructions (Signed)

## 2019-10-14 NOTE — Progress Notes (Signed)
Cystoscopy Procedure Note:  Indication: Stent removal s/p TURBT 05/22/2019  After informed consent and discussion of the procedure and its risks, Misty Ferguson was positioned and prepped in the standard fashion. Cystoscopy was performed with a flexible cystoscope. The stent was grasped with flexible graspers and removed in its entirety. The patient tolerated the procedure well.   Assessment and Plan: To briefly summarize, she is a 86 year old female with COPD on oxygen who presented in September with gross hematuria and was found to have a 3 cm left-sided bladder mass with left-sided hydronephrosis and eGFR of 33.  She underwent a complete TURBT on 05/22/2019 that showed high-grade muscle invasive urothelial cell carcinoma with squamous differentiation with left ureteral stent placement.  She refused cystectomy and elected for radiation but also refused concurrent chemotherapy.  First surveillance imaging 09/23/2019 with CT chest/abdomen/pelvis showed no evidence of recurrence.  There was no tumor on clinic cystoscopy, and cytology was atypical.  We discussed options and she elected to pursue stent removal in clinic with surveillance moving forward.  She was very adamant that she does not want close surveillance, she does not want anything else done if her tumor recurs outside of the bladder.  It sounds like she is amenable to a repeat TURBT if her bladder cancer recurs locally.  I explained at length to her and her husband that though I certainly understand her desire for less invasive and frequent surveillance with her refusal of chemotherapy or other concurrent therapies if she has disease recurrence outside the bladder, if she has bladder tumor recurrence, this can be managed relatively easily with TURBT.  We also discussed the risk of recurrence or stricture at the left distal ureter resulting in recurrent hydronephrosis, but she would like to hold off on any repeat ultrasound at this time.     After long conversation, she was amenable to following up in 6 months with a repeat CT of the chest/abdomen/pelvis and clinic cystoscopy.  We discussed return precautions at length including flank pain, gross hematuria, weight loss, or abdominal pain.  RTC 6 months for surveillance imaging and cystoscopy  Billey Co, MD 10/14/2019

## 2019-10-19 DIAGNOSIS — H353221 Exudative age-related macular degeneration, left eye, with active choroidal neovascularization: Secondary | ICD-10-CM | POA: Diagnosis not present

## 2019-10-20 ENCOUNTER — Other Ambulatory Visit: Payer: Medicare HMO

## 2019-10-20 ENCOUNTER — Other Ambulatory Visit: Payer: Self-pay | Admitting: Family Medicine

## 2019-10-20 ENCOUNTER — Ambulatory Visit: Payer: Medicare HMO | Admitting: Oncology

## 2019-10-20 ENCOUNTER — Telehealth: Payer: Self-pay

## 2019-10-20 DIAGNOSIS — H5789 Other specified disorders of eye and adnexa: Secondary | ICD-10-CM

## 2019-10-20 DIAGNOSIS — R7303 Prediabetes: Secondary | ICD-10-CM

## 2019-10-20 DIAGNOSIS — E7800 Pure hypercholesterolemia, unspecified: Secondary | ICD-10-CM

## 2019-10-20 NOTE — Telephone Encounter (Signed)
[facility] - Client Nonclinical Telephone Record AccessNurse Client [place] [facility] - Client Client Site [facility] Physician Eliezer Lofts - MD Contact Type Call Who Is Calling Physician / Provider / Hospital Call Type Provider Call Message Only Reason for Call Request to send message to Office Initial Comment Caller states he is Bill P. an ophthalmologist, calling regarding a mutual patient. He is wanting to get some blood work done for her. The patient is Misty Ferguson, DOB [birth year]. Rush Landmark can be reached at [phone]. Additional Comment Disp. Time Disposition Final User 10/19/2019 6:08:01 PM General Information Provided Yes Phillips Hay Call Closed By: Phillips Hay Transaction Date/Time: 10/19/2019 6:05:05 PM (ET)

## 2019-10-20 NOTE — Telephone Encounter (Signed)
Left message for Dr. George Ina with below information from Dr. [name].

## 2019-10-20 NOTE — Telephone Encounter (Signed)
Noted  

## 2019-10-20 NOTE — Telephone Encounter (Signed)
I spoke with Dr Birder Robson at [facility] who request and addition to labs drawn on 10/22/19 at [facility] for sed rate and CRP. Pt was seen by Dr George Ina at [facility] on 10/19/19 and may have poor circulation of eye. Pt already has lab appt on 10/22/19 at [facility]. I explained at [facility] we are only allowed to draw labs on pt for [facility] providers this is in part to make sure that the provider who is requesting the labs gets the lab results back and when asked if Dr George Ina uses [employer] as a draw station he replied he does not appreciate this policy and wants to speak with Dr [name]. I told Dr George Ina I would send the request to Dr [name] and either Dr [name] or her medical assistant would return his call. He thanked me and ended the call.

## 2019-10-20 NOTE — Telephone Encounter (Signed)
Left v/m requesting cb from [place].

## 2019-10-20 NOTE — Telephone Encounter (Signed)
Call [place] please.. we can make a exception when necessary especially with [facility] MD on Epic.  Policy in place  to get MD okay to simply to limit this practice. Pt already has lab appt.. will add sed rate and CRP.

## 2019-10-20 NOTE — Telephone Encounter (Signed)
Dr George Ina calling office;he did not listen to v/m he just returned Dr Rometta Emery call. I asked Dr George Ina if I could give him the message from Dr [name] or does he need to speak with Dr [name] himself; he said to give him the message. Dr George Ina received message from Dr [name] as instructed and Dr George Ina voiced understanding and was appreciative and when I asked for fax # to send results Dr George Ina said he prefers a cb with results on [phone]. FYI to [facility] CMA.

## 2019-10-22 ENCOUNTER — Other Ambulatory Visit (INDEPENDENT_AMBULATORY_CARE_PROVIDER_SITE_OTHER): Payer: Medicare HMO

## 2019-10-22 ENCOUNTER — Other Ambulatory Visit: Payer: Self-pay

## 2019-10-22 DIAGNOSIS — R7303 Prediabetes: Secondary | ICD-10-CM | POA: Diagnosis not present

## 2019-10-22 DIAGNOSIS — E7800 Pure hypercholesterolemia, unspecified: Secondary | ICD-10-CM | POA: Diagnosis not present

## 2019-10-22 DIAGNOSIS — H5789 Other specified disorders of eye and adnexa: Secondary | ICD-10-CM

## 2019-10-22 LAB — COMPREHENSIVE METABOLIC PANEL
ALT: 24 U/L (ref 0–35)
AST: 26 U/L (ref 0–37)
Albumin: 4 g/dL (ref 3.5–5.2)
Alkaline Phosphatase: 82 U/L (ref 39–117)
BUN: 16 mg/dL (ref 6–23)
CO2: 30 mEq/L (ref 19–32)
Calcium: 9.4 mg/dL (ref 8.4–10.5)
Chloride: 103 mEq/L (ref 96–112)
Creatinine, Ser: 0.92 mg/dL (ref 0.40–1.20)
GFR: 58.24 mL/min — ABNORMAL LOW (ref >60.00)
Glucose, Bld: 108 mg/dL — ABNORMAL HIGH (ref 70–99)
Potassium: 5.2 mEq/L — ABNORMAL HIGH (ref 3.5–5.1)
Sodium: 140 mEq/L (ref 135–145)
Total Bilirubin: 0.5 mg/dL (ref 0.2–1.2)
Total Protein: 6.3 g/dL (ref 6.0–8.3)

## 2019-10-22 LAB — SEDIMENTATION RATE: Sed Rate: 1 mm/hr (ref 0–30)

## 2019-10-22 LAB — LIPID PANEL
Cholesterol: 240 mg/dL — ABNORMAL HIGH (ref 0–200)
HDL: 125.2 mg/dL (ref >39.00)
LDL Cholesterol: 101 mg/dL — ABNORMAL HIGH (ref 0–99)
NonHDL: 114.96
Total CHOL/HDL Ratio: 2
Triglycerides: 69 mg/dL (ref 0.0–149.0)
VLDL: 13.8 mg/dL (ref 0.0–40.0)

## 2019-10-22 LAB — HEMOGLOBIN A1C: Hgb A1c MFr Bld: 5.2 % (ref 4.6–6.5)

## 2019-10-22 LAB — C-REACTIVE PROTEIN: CRP: <1 mg/dL (ref 0.5–20.0)

## 2019-10-27 NOTE — Telephone Encounter (Addendum)
Lab results were never forwarded back to me.  I called and spoke to Misty Ferguson to instruct her to stop any potassium supplement, high potassium foods and multivitamin due to her potassium level came back elevated at 5.2 mEq/L.  I called Dr. George Ina with normal ESR and CRP results.

## 2019-10-28 ENCOUNTER — Other Ambulatory Visit: Payer: Self-pay

## 2019-10-28 ENCOUNTER — Ambulatory Visit (INDEPENDENT_AMBULATORY_CARE_PROVIDER_SITE_OTHER): Payer: Medicare HMO

## 2019-10-28 VITALS — BP 144/81 | Temp 97.6°F | Wt 102.0 lb

## 2019-10-28 DIAGNOSIS — Z0000 Encounter for general adult medical examination without abnormal findings: Secondary | ICD-10-CM

## 2019-10-28 NOTE — Progress Notes (Signed)
Subjective:   Misty Ferguson is a 86 y.o. female who presents for Medicare Annual (Subsequent) preventive examination.  Review of Systems: N/A   This visit is being conducted through telemedicine via telephone at the nurse health advisor's home address due to the COVID-19 pandemic. This patient has given me verbal consent via doximity to conduct this visit, patient states they are participating from their home address. Patient and myself are on the telephone call. There is no referral for this visit. Some vital signs may be absent or patient reported.    Patient identification: identified by name, DOB, and current address   Cardiac Risk Factors include: advanced age (>58men, >69 women);hypertension;Other (see comment), Risk factor comments: hypercholesterolemia     Objective:     Vitals: BP (!) 144/81   Temp 97.6 F (36.4 C)   Wt 102 lb (46.3 kg)   BMI 19.27 kg/m   Body mass index is 19.27 kg/m.  Advanced Directives 10/28/2019 06/08/2019 05/18/2019 11/05/2017 10/23/2017 10/23/2017 10/22/2017  Does Patient Have a Medical Advance Directive? Yes Yes Yes Yes - Yes -  Type of Advance Directive [place];Living will Living will;Healthcare Power of [place] of facility DNR (pink MOST or yellow form) Living will;Healthcare Power of [place];Out of facility DNR (pink MOST or yellow form)  Does patient want to make changes to medical advance directive? - - - No - Patient declined - No - Patient declined -  Copy of [place] in Chart? No - copy requested - - - - No - copy requested No - copy requested    Tobacco Social History   Tobacco Use  Smoking Status Former Smoker  . Packs/day: 1.30  . Years: 45.00  . Pack years: 58.50  . Types: Cigarettes  . Quit date: 08/14/2003  . Years since quitting: 16.2  Smokeless Tobacco Never Used     Counseling given: Not  Answered   Clinical Intake:  Pre-visit preparation completed: Yes  Pain : 0-10 Pain Score: 6  Pain Type: Chronic pain Pain Location: Foot Pain Orientation: Left, Right Pain Descriptors / Indicators: Aching, Burning Pain Onset: More than a month ago Pain Frequency: Constant     Nutritional Risks: None Diabetes: No  How often do you need to have someone help you when you read instructions, pamphlets, or other written materials from your doctor or pharmacy?: 1 - Never What is the last grade level you completed in school?: 2 years of college  Interpreter Needed?: No  Information entered by :: CJohnson, LPN  Past Medical History:  Diagnosis Date  . Burping   . Chronic airway obstruction, not elsewhere classified   . Dyspnea   . Dysrhythmia   . Macular degeneration (senile) of retina, unspecified   . Obstructive chronic bronchitis with exacerbation ([place])   . Osteoporosis, unspecified   . Oxygen deficiency   . Personal history of peptic ulcer disease   . Tobacco use disorder   . Unspecified essential hypertension   . Unspecified glaucoma(365.9)    Past Surgical History:  Procedure Laterality Date  . CATARACT EXTRACTION W/ INTRAOCULAR LENS  IMPLANT, BILATERAL    . CYSTOSCOPY WITH STENT PLACEMENT Left 05/22/2019   Procedure: CYSTOSCOPY WITH STENT PLACEMENT;  Surgeon: Billey Co, MD;  Location: [facility] ORS;  Service: Urology;  Laterality: Left;  . ELBOW FRACTURE SURGERY Left   . TRANSURETHRAL RESECTION OF BLADDER TUMOR N/A 05/22/2019   Procedure:  TRANSURETHRAL RESECTION OF BLADDER TUMOR (TURBT);  Surgeon: Billey Co, MD;  Location: [facility] OR[facility];  Service: Urology;  Laterality: N/A;   Family History  Adopted: Yes   Social History   Socioeconomic History  . Marital status: Married    Spouse name: Not on file  . Number of children: 3  . Years of education: Not on file  . Highest education level: Not on file  Occupational History  . Occupation: RETIRED     Employer: RETIRED  Tobacco Use  . Smoking status: Former Smoker    Packs/day: 1.30    Years: 45.00    Pack years: 58.50    Types: Cigarettes    Quit date: 08/14/2003    Years since quitting: 16.2  . Smokeless tobacco: Never Used  Substance and Sexual Activity  . Alcohol use: Yes    Alcohol/week: 14.0 standard drinks    Types: 14 Glasses of wine per week    Comment: 1-2 wine daily  . Drug use: No  . Sexual activity: Yes  Other Topics Concern  . Not on file  Social History Narrative   Tobacco abuse x 45 years   Social Determinants of Health   Financial Resource Strain: Low Risk   . Difficulty of Paying Living Expenses: Not hard at all  Food Insecurity: No Food Insecurity  . Worried About Charity fundraiser in the Last Year: Never true  . Ran Out of Food in the Last Year: Never true  Transportation Needs: No Transportation Needs  . Lack of Transportation (Medical): No  . Lack of Transportation (Non-Medical): No  Physical Activity: Insufficiently Active  . Days of Exercise per Week: 5 days  . Minutes of Exercise per Session: 10 min  Stress: No Stress Concern Present  . Feeling of Stress : Not at all  Social Connections:   . Frequency of Communication with Friends and Family:   . Frequency of Social Gatherings with Friends and Family:   . Attends Religious Services:   . Active Member of Clubs or Organizations:   . Attends Archivist Meetings:   Marland Kitchen Marital Status:     Outpatient Encounter Medications as of 10/28/2019  Medication Sig  . acidophilus (RISAQUAD) CAPS capsule Take 1 capsule by mouth daily.  Marland Kitchen albuterol (PROVENTIL HFA;VENTOLIN HFA) 108 (90 Base) MCG/ACT inhaler Inhale 2 puffs into the lungs every 6 (six) hours as needed for wheezing or shortness of breath.  Marland Kitchen albuterol (PROVENTIL) (2.5 MG/3ML) 0.083% nebulizer solution Take 3 mLs (2.5 mg total) by nebulization every 6 (six) hours as needed for wheezing or shortness of breath.  Marland Kitchen ascorbic acid (VITAMIN C)  1000 MG tablet Take by mouth.  . Cholecalciferol (VITAMIN D) 125 MCG (5000 UT) CAPS Take 5,000 Units by mouth daily.  Marland Kitchen docusate sodium (COLACE) 100 MG capsule Take 100 mg by mouth 2 (two) times daily.  . hydroxypropyl methylcellulose / hypromellose (ISOPTO TEARS / GONIOVISC) 2.5 % ophthalmic solution Place 1 drop into both eyes 3 (three) times daily as needed for dry eyes.  Marland Kitchen losartan (COZAAR) 25 MG tablet   . Multiple Vitamins-Minerals (PRESERVISION AREDS) TABS Take 1 tablet by mouth 2 (two) times daily.  . Omega-3 Fatty Acids (FISH OIL) 1000 MG CAPS Take 1,000 mg by mouth 2 (two) times daily.   Marland Kitchen OVER THE COUNTER MEDICATION Take 1 tablet by mouth daily. Super Cleanser  . OXYGEN Inhale 3.5 L into the lungs 3 (three) times daily as needed (shortness of breath).  Marland Kitchen  silver sulfADIAZINE (SILVADENE) 1 % cream Apply 1 application topically daily.  Marland Kitchen STIOLTO RESPIMAT 2.5-2.5 MCG/ACT AERS INHALE 2 PUFFS INTO THE LUNGS DAILY.  Marland Kitchen triamcinolone cream (KENALOG) 0.1 % Apply 1 application topically 3 (three) times a week.  . verapamil (CALAN-SR) 240 MG CR tablet 360 mg.   . Vitamin E 180 MG CAPS Take 180 mg by mouth daily.  . mupirocin ointment (BACTROBAN) 2 % Apply 1 application topically daily.    No facility-administered encounter medications on file as of 10/28/2019.    Activities of Daily Living In your present state of health, do you have any difficulty performing the following activities: 10/28/2019 05/18/2019  Hearing? N Y  Vision? 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 N Y  Walking or climbing stairs? N Y  Dressing or bathing? N Y  Doing errands, shopping? N N  Preparing Food and eating ? N -  Using the Toilet? N -  In the past six months, have you accidently leaked urine? N -  Do you have problems with loss of bowel control? N -  Managing your Medications? N -  Managing your Finances? N -  Housekeeping or managing your  Housekeeping? N -  Some recent data might be hidden    Patient Care Team: [name], MD as PCP - General Flora Lipps, MD as Consulting Physician (Pulmonary Disease) Birder Robson, MD as Referring Physician (Ophthalmology) Teodoro Spray, MD as Consulting Physician (Cardiology)    Assessment:   This is a routine wellness examination for Misty Ferguson.  Exercise Activities and Dietary recommendations Current Exercise Habits: Home exercise routine, Type of exercise: treadmill, Time (Minutes): 15, Frequency (Times/Week): 5, Weekly Exercise (Minutes/Week): 75, Intensity: Moderate, Exercise limited by: None identified  Goals    . Increase physical activity     Starting 10/04/2016, I will continue to exercise at least 40 min daily.     . Increase physical activity     Starting 10/11/2017, I will continue to walk on treadmill for 15-20 minutes 5 days per week.     . Patient Stated     10/28/2019, I will continue to ride my treadmill 5 days a week for 12 minutes.       Fall Risk Fall Risk  10/28/2019 11/28/2017 11/05/2017 10/11/2017 10/04/2016  Falls in the past year? 1 Yes Yes No Yes  Comment lightheadedness - - - pt reported fall in bedroom; no injury  Number falls in past yr: 1 1 1  - 1  Injury with Fall? 0 Yes Yes - No  Risk Factor Category  - High Fall Risk High Fall Risk - -  Risk for fall due to : Medication side effect;History of fall(s) History of fall(s) History of fall(s);Impaired balance/gait - -  Follow up Falls evaluation completed;Falls prevention discussed Falls evaluation completed;Education provided Falls prevention discussed - -   Is the patient's home free of loose throw rugs in walkways, pet beds, electrical cords, etc?   yes      Grab bars in the bathroom? no      Handrails on the stairs?   no      Adequate lighting?   yes  Timed Get Up and Go performed: N/A  Depression Screen PHQ 2/9 Scores 10/28/2019 11/05/2017 10/11/2017 10/04/2016  PHQ - 2 Score 0 1 0 0  PHQ- 9  Score 0 - 0 -     Cognitive Function MMSE - Mini Mental State  Exam 10/28/2019 10/11/2017 10/04/2016  Orientation to time 5 5 5   Orientation to Place 5 5 5   Registration 3 3 3   Attention/ Calculation 5 0 0  Recall 3 3 3   Language- name 2 objects - 0 0  Language- repeat 1 1 1   Language- follow 3 step command - 3 3  Language- read & follow direction - 0 0  Write a sentence - 0 0  Copy design - 0 0  Total score - 20 20  Mini Cog  Mini-Cog screen was completed. Maximum score is 22. A value of 0 denotes this part of the MMSE was not completed or the patient failed this part of the Mini-Cog screening.       Immunization History  Administered Date(s) Administered  . Fluad Quad(high Dose 65+) 04/09/2019  . H1N1 10/04/2008  . Influenza Split 04/21/2011, 05/12/2012  . Influenza Whole 05/27/2007, 05/08/2008, 05/04/2009, 04/26/2010  . Influenza,inj,Quad PF,6+ Mos 05/06/2013, 05/06/2014, 04/21/2015, 05/11/2016, 04/17/2017, 05/01/2018  . Pneumococcal Conjugate-13 09/21/2014  . Pneumococcal Polysaccharide-23 03/13/2006, 06/28/2011  . Td 03/13/2006  . Tdap 10/04/2016  . Zoster 09/05/2010    Qualifies for Shingles Vaccine: Yes   Screening Tests Health Maintenance  Topic Date Due  . TETANUS/TDAP  10/04/2026  . INFLUENZA VACCINE  Completed  . DEXA SCAN  Completed  . PNA vac Low Risk Adult  Completed    Cancer Screenings: Lung: Low Dose CT Chest recommended if Age 33-80 years, 30 pack-year currently smoking OR have quit w/in 15 years. Patient does not qualify. Breast: Mammogram: no longer required   Bone Density/Dexa: completed 05/28/2016 Colorectal: no longer required  Additional Screenings:  Hepatitis C Screening: N/A     Plan:   Patient will continue to ride her treadmill 5 days a week for 12 minutes.    I have personally reviewed and noted the following in the patient's chart:   . Medical and social history . Use of alcohol, tobacco or illicit drugs  . Current medications  and supplements . Functional ability and status . Nutritional status . Physical activity . Advanced directives . List of other physicians . Hospitalizations, surgeries, and ER visits in previous 12 months . Vitals . Screenings to include cognitive, depression, and falls . Referrals and appointments  In addition, I have reviewed and discussed with patient certain preventive protocols, quality metrics, and best practice recommendations. A written personalized care plan for preventive services as well as general preventive health recommendations were provided to patient.     Andrez Grime, LPN  [phone]

## 2019-10-28 NOTE — Patient Instructions (Signed)
Misty Ferguson , Thank you for taking time to come for your Medicare Wellness Visit. I appreciate your ongoing commitment to your health goals. Please review the following plan we discussed and let me know if I can assist you in the future.   Screening recommendations/referrals: Colonoscopy: no longer required Mammogram: no longer required Bone Density: completed 05/28/2016 Recommended yearly ophthalmology/optometry visit for glaucoma screening and checkup Recommended yearly dental visit for hygiene and checkup  Vaccinations: Influenza vaccine: Up to date, completed 04/09/2019 Pneumococcal vaccine: Completed series Tdap vaccine: Up to date, completed 10/04/2016 Shingles vaccine: discussed    Advanced directives: Please bring a copy of your POA (Power of [place]) and/or Living Will to your next appointment.   Conditions/risks identified: hypertension,hypercholesterolemia  Next appointment: 10/30/2019 @ 8:40 am    Preventive Care 65 Years and Older, Female Preventive care refers to lifestyle choices and visits with your health care provider that can promote health and wellness. What does preventive care include?  A yearly physical exam. This is also called an annual well check.  Dental exams once or twice a year.  Routine eye exams. Ask your health care provider how often you should have your eyes checked.  Personal lifestyle choices, including:  Daily care of your teeth and gums.  Regular physical activity.  Eating a healthy diet.  Avoiding tobacco and drug use.  Limiting alcohol use.  Practicing safe sex.  Taking low-dose aspirin every day.  Taking vitamin and mineral supplements as recommended by your health care provider. What happens during an annual well check? The services and screenings done by your health care provider during your annual well check will depend on your age, overall health, lifestyle risk factors, and family history of disease. Counseling  Your  health care provider may ask you questions about your:  Alcohol use.  Tobacco use.  Drug use.  Emotional well-being.  Home and relationship well-being.  Sexual activity.  Eating habits.  History of falls.  Memory and ability to understand (cognition).  Work and work Statistician.  Reproductive health. Screening  You may have the following tests or measurements:  Height, weight, and BMI.  Blood pressure.  Lipid and cholesterol levels. These may be checked every 5 years, or more frequently if you are over 45 years old.  Skin check.  Lung cancer screening. You may have this screening every year starting at age 79 if you have a 30-pack-year history of smoking and currently smoke or have quit within the past 15 years.  Fecal occult blood test (FOBT) of the stool. You may have this test every year starting at age 64.  Flexible sigmoidoscopy or colonoscopy. You may have a sigmoidoscopy every 5 years or a colonoscopy every 10 years starting at age 5.  Hepatitis C blood test.  Hepatitis B blood test.  Sexually transmitted disease (STD) testing.  Diabetes screening. This is done by checking your blood sugar (glucose) after you have not eaten for a while (fasting). You may have this done every 1-3 years.  Bone density scan. This is done to screen for osteoporosis. You may have this done starting at age 37.  Mammogram. This may be done every 1-2 years. Talk to your health care provider about how often you should have regular mammograms. Talk with your health care provider about your test results, treatment options, and if necessary, the need for more tests. Vaccines  Your health care provider may recommend certain vaccines, such as:  Influenza vaccine. This is recommended every year.  Tetanus, diphtheria, and acellular pertussis (Tdap, Td) vaccine. You may need a Td booster every 10 years.  Zoster vaccine. You may need this after age 43.  Pneumococcal 13-valent  conjugate (PCV13) vaccine. One dose is recommended after age 68.  Pneumococcal polysaccharide (PPSV23) vaccine. One dose is recommended after age 17. Talk to your health care provider about which screenings and vaccines you need and how often you need them. This information is not intended to replace advice given to you by your health care provider. Make sure you discuss any questions you have with your health care provider. Document Released: 08/26/2015 Document Revised: 04/18/2016 Document Reviewed: 05/31/2015 Elsevier Interactive Patient Education  2017 [place] Prevention in the Home Falls can cause injuries. They can happen to people of all ages. There are many things you can do to make your home safe and to help prevent falls. What can I do on the outside of my home?  Regularly fix the edges of walkways and driveways and fix any cracks.  Remove anything that might make you trip as you walk through a door, such as a raised step or threshold.  Trim any bushes or trees on the path to your home.  Use bright outdoor lighting.  Clear any walking paths of anything that might make someone trip, such as rocks or tools.  Regularly check to see if handrails are loose or broken. Make sure that both sides of any steps have handrails.  Any raised decks and porches should have guardrails on the edges.  Have any leaves, snow, or ice cleared regularly.  Use sand or salt on walking paths during winter.  Clean up any spills in your garage right away. This includes oil or grease spills. What can I do in the bathroom?  Use night lights.  Install grab bars by the toilet and in the tub and shower. Do not use towel bars as grab bars.  Use non-skid mats or decals in the tub or shower.  If you need to sit down in the shower, use a plastic, non-slip stool.  Keep the floor dry. Clean up any water that spills on the floor as soon as it happens.  Remove soap buildup in the tub or  shower regularly.  Attach bath mats securely with double-sided non-slip rug tape.  Do not have throw rugs and other things on the floor that can make you trip. What can I do in the bedroom?  Use night lights.  Make sure that you have a light by your bed that is easy to reach.  Do not use any sheets or blankets that are too big for your bed. They should not hang down onto the floor.  Have a firm chair that has side arms. You can use this for support while you get dressed.  Do not have throw rugs and other things on the floor that can make you trip. What can I do in the kitchen?  Clean up any spills right away.  Avoid walking on wet floors.  Keep items that you use a lot in easy-to-reach places.  If you need to reach something above you, use a strong step stool that has a grab bar.  Keep electrical cords out of the way.  Do not use floor polish or wax that makes floors slippery. If you must use wax, use non-skid floor wax.  Do not have throw rugs and other things on the floor that can make you trip. What can I do  with my stairs?  Do not leave any items on the stairs.  Make sure that there are handrails on both sides of the stairs and use them. Fix handrails that are broken or loose. Make sure that handrails are as long as the stairways.  Check any carpeting to make sure that it is firmly attached to the stairs. Fix any carpet that is loose or worn.  Avoid having throw rugs at the top or bottom of the stairs. If you do have throw rugs, attach them to the floor with carpet tape.  Make sure that you have a light switch at the top of the stairs and the bottom of the stairs. If you do not have them, ask someone to add them for you. What else can I do to help prevent falls?  Wear shoes that:  Do not have high heels.  Have rubber bottoms.  Are comfortable and fit you well.  Are closed at the toe. Do not wear sandals.  If you use a stepladder:  Make sure that it is fully  opened. Do not climb a closed stepladder.  Make sure that both sides of the stepladder are locked into place.  Ask someone to hold it for you, if possible.  Clearly mark and make sure that you can see:  Any grab bars or handrails.  First and last steps.  Where the edge of each step is.  Use tools that help you move around (mobility aids) if they are needed. These include:  Canes.  Walkers.  Scooters.  Crutches.  Turn on the lights when you go into a dark area. Replace any light bulbs as soon as they burn out.  Set up your furniture so you have a clear path. Avoid moving your furniture around.  If any of your floors are uneven, fix them.  If there are any pets around you, be aware of where they are.  Review your medicines with your doctor. Some medicines can make you feel dizzy. This can increase your chance of falling. Ask your doctor what other things that you can do to help prevent falls. This information is not intended to replace advice given to you by your health care provider. Make sure you discuss any questions you have with your health care provider. Document Released: 05/26/2009 Document Revised: 01/05/2016 Document Reviewed: 09/03/2014 Elsevier Interactive Patient Education  2017 [employer].

## 2019-10-28 NOTE — Progress Notes (Signed)
PCP notes:  Health Maintenance: No gaps noted   Abnormal Screenings: none   Patient concerns: Hematoma on left knee   Nurse concerns: none   Next PCP appt: 10/30/2019 @ 8:40 am

## 2019-10-30 ENCOUNTER — Ambulatory Visit: Payer: Medicare HMO

## 2019-10-30 ENCOUNTER — Encounter: Payer: Medicare HMO | Admitting: Family Medicine

## 2019-10-30 ENCOUNTER — Ambulatory Visit (INDEPENDENT_AMBULATORY_CARE_PROVIDER_SITE_OTHER): Payer: Medicare HMO | Admitting: Family Medicine

## 2019-10-30 ENCOUNTER — Other Ambulatory Visit: Payer: Self-pay

## 2019-10-30 ENCOUNTER — Encounter: Payer: Self-pay | Admitting: Family Medicine

## 2019-10-30 VITALS — BP 132/60 | HR 78 | Temp 97.4°F | Ht 61.0 in | Wt 101.0 lb

## 2019-10-30 DIAGNOSIS — J432 Centrilobular emphysema: Secondary | ICD-10-CM

## 2019-10-30 DIAGNOSIS — C674 Malignant neoplasm of posterior wall of bladder: Secondary | ICD-10-CM

## 2019-10-30 DIAGNOSIS — Z0000 Encounter for general adult medical examination without abnormal findings: Secondary | ICD-10-CM | POA: Diagnosis not present

## 2019-10-30 DIAGNOSIS — R7303 Prediabetes: Secondary | ICD-10-CM | POA: Diagnosis not present

## 2019-10-30 DIAGNOSIS — I10 Essential (primary) hypertension: Secondary | ICD-10-CM

## 2019-10-30 DIAGNOSIS — J9611 Chronic respiratory failure with hypoxia: Secondary | ICD-10-CM

## 2019-10-30 DIAGNOSIS — D692 Other nonthrombocytopenic purpura: Secondary | ICD-10-CM | POA: Diagnosis not present

## 2019-10-30 DIAGNOSIS — E875 Hyperkalemia: Secondary | ICD-10-CM

## 2019-10-30 LAB — BASIC METABOLIC PANEL
BUN: 14 mg/dL (ref 6–23)
CO2: 31 mEq/L (ref 19–32)
Calcium: 9.9 mg/dL (ref 8.4–10.5)
Chloride: 102 mEq/L (ref 96–112)
Creatinine, Ser: 0.81 mg/dL (ref 0.40–1.20)
GFR: 67.46 mL/min (ref >60.00)
Glucose, Bld: 106 mg/dL — ABNORMAL HIGH (ref 70–99)
Potassium: 4.9 mEq/L (ref 3.5–5.1)
Sodium: 140 mEq/L (ref 135–145)

## 2019-10-30 NOTE — Progress Notes (Addendum)
Chief Complaint  Patient presents with  . Annual Exam    Part 2    History of Present Illness: HPI   The patient presents for complete physical and review of chronic health problems. He/She also has the following acute concerns today:none  The patient saw a LPN or RN for medicare wellness visit.  Prevention and wellness was reviewed in detail. Note reviewed and important notes copied below. Abnormal Screenings: none Patient concerns: Hematoma on left  Upper leg.. present for 13 years.Marland Kitchen aches... occ using tylenol  10/30/19 In last year she has been treated for bladder cancer with Dr. Diamantina Providence. S.P surgery and radiation.  Severe COPD on chronic oxygen. gradually worsening. Followed by Dr. Mortimer Fries. On stiolto She is in a wheelchair to get into the room from the waiting room due to SOB.  Her decreased mobility  Is primarily due to shortness of breath from her severe COPD.  She cannot walk more than 10 feet without increased work of breathing.  She gets short of breath moving from den to the kitchen. She has trouble getting to kitchen to eat given shortness of breath. She has trouble bathing and dressing due to shortness of breathing. She has to rest every 5 minutes. She would do well with a  small utility chair that her husband can push to improve her mobility.   Her decreased mobility  Is primarily due to shortness of breath from her severe COPD.  Patient suffers from severe COPD and respiratory failure which impairs their ability to perform daily activities like (dressing, grooming, bathing) in the home. A cane, walker or crutch will not resolve issues. A transport chair will allow the patient to safely perform daily activities. Patient is unable to use a standard WC on their own and has a caregiver who is available, willing, and able to provide assistance with the transport chair   Hypertension:  At goal on losartan   High potassium at recent check at 5.2 BP Readings from Last 3  Encounters:  10/30/19 132/60  10/28/19 (!) 144/81  10/14/19 (!) 167/89  Using medication without problems or lightheadedness: none Chest pain with exertion:none Edema:none Short of breath: stable Average home BPs: Other issues:  Elevated Cholesterol:Improved control from last year.  HDL is very high. Lab Results  Component Value Date   CHOL 240 (H) 10/22/2019   HDL 125.20 10/22/2019   LDLCALC 101 (H) 10/22/2019   LDLDIRECT 99.1 09/08/2013   TRIG 69.0 10/22/2019   CHOLHDL 2 10/22/2019  Using medications without problems: Muscle aches:  Diet compliance: good Exercise: limited Other complaints:   prediabetes, resolved. Lab Results  Component Value Date   HGBA1C 5.2 10/22/2019   Macular degeneration      Clinical Support from 10/28/2019 in [place] at [facility] Total Score  0       This visit occurred during the SARS-CoV-2 public health emergency.  Safety protocols were in place, including screening questions prior to the visit, additional usage of staff PPE, and extensive cleaning of exam room while observing appropriate contact time as indicated for disinfecting solutions.   COVID 19 screen:  No recent travel or known exposure to COVID19 The patient denies respiratory symptoms of COVID 19 at this time. The importance of social distancing was discussed today.     Review of Systems  Constitutional: Negative for chills and fever.  HENT: Negative for congestion and ear pain.   Eyes: Negative for pain and redness.  Respiratory: Negative for cough  and shortness of breath.   Cardiovascular: Negative for chest pain, palpitations and leg swelling.  Gastrointestinal: Negative for abdominal pain, blood in stool, constipation, diarrhea, nausea and vomiting.  Genitourinary: Negative for dysuria.  Musculoskeletal: Negative for falls and myalgias.  Skin: Negative for rash.  Neurological: Negative for dizziness.  Psychiatric/Behavioral: Negative for  depression. The patient is not nervous/anxious.       Past Medical History:  Diagnosis Date  . Burping   . Chronic airway obstruction, not elsewhere classified   . Dyspnea   . Dysrhythmia   . Macular degeneration (senile) of retina, unspecified   . Obstructive chronic bronchitis with exacerbation ([place])   . Osteoporosis, unspecified   . Oxygen deficiency   . Personal history of peptic ulcer disease   . Tobacco use disorder   . Unspecified essential hypertension   . Unspecified glaucoma(365.9)     reports that she quit smoking about 16 years ago. Her smoking use included cigarettes. She has a 58.50 pack-year smoking history. She has never used smokeless tobacco. She reports current alcohol use of about 14.0 standard drinks of alcohol per week. She reports that she does not use drugs.   Current Outpatient Medications:  .  albuterol (PROVENTIL HFA;VENTOLIN HFA) 108 (90 Base) MCG/ACT inhaler, Inhale 2 puffs into the lungs every 6 (six) hours as needed for wheezing or shortness of breath., Disp: 1 Inhaler, Rfl: 6 .  albuterol (PROVENTIL) (2.5 MG/3ML) 0.083% nebulizer solution, Take 3 mLs (2.5 mg total) by nebulization every 6 (six) hours as needed for wheezing or shortness of breath., Disp: 75 mL, Rfl: 12 .  ascorbic acid (VITAMIN C) 1000 MG tablet, Take by mouth., Disp: , Rfl:  .  Cholecalciferol (VITAMIN D) 125 MCG (5000 UT) CAPS, Take 5,000 Units by mouth daily., Disp: , Rfl:  .  docusate sodium (COLACE) 100 MG capsule, Take 100 mg by mouth 2 (two) times daily., Disp: , Rfl:  .  losartan (COZAAR) 25 MG tablet, , Disp: , Rfl:  .  Multiple Vitamins-Minerals (PRESERVISION AREDS) TABS, Take 1 tablet by mouth 2 (two) times daily., Disp: , Rfl:  .  Omega-3 Fatty Acids (FISH OIL) 1000 MG CAPS, Take 1,000 mg by mouth 2 (two) times daily. , Disp: , Rfl:  .  OVER THE COUNTER MEDICATION, Take 1 tablet by mouth daily. Super Cleanser, Disp: , Rfl:  .  OXYGEN, Inhale 3.5 L into the lungs 3 (three) times  daily as needed (shortness of breath)., Disp: , Rfl:  .  silver sulfADIAZINE (SILVADENE) 1 % cream, Apply 1 application topically daily., Disp: 50 g, Rfl: 0 .  STIOLTO RESPIMAT 2.5-2.5 MCG/ACT AERS, INHALE 2 PUFFS INTO THE LUNGS DAILY., Disp: 12 g, Rfl: 1 .  verapamil (CALAN-SR) 240 MG CR tablet, 360 mg. , Disp: , Rfl:  .  Vitamin E 180 MG CAPS, Take 180 mg by mouth daily., Disp: , Rfl:    Observations/Objective: Blood pressure 132/60, pulse 78, temperature (!) 97.4 F (36.3 C), temperature source Temporal, height 5\' 1"  (1.549 m), weight 101 lb (45.8 kg), SpO2 90 %.  Physical Exam Constitutional:      General: She is not in acute distress.    Appearance: Normal appearance. She is well-developed. She is not ill-appearing or toxic-appearing.  HENT:     Head: Normocephalic.     Right Ear: Hearing, tympanic membrane, ear canal and external ear normal. Tympanic membrane is not erythematous, retracted or bulging.     Left Ear: Hearing, tympanic membrane, ear  canal and external ear normal. Tympanic membrane is not erythematous, retracted or bulging.     Nose: No mucosal edema or rhinorrhea.     Right Sinus: No maxillary sinus tenderness or frontal sinus tenderness.     Left Sinus: No maxillary sinus tenderness or frontal sinus tenderness.     Mouth/Throat:     Pharynx: Uvula midline.  Eyes:     General: Lids are normal. Lids are everted, no foreign bodies appreciated.     Conjunctiva/sclera: Conjunctivae normal.     Pupils: Pupils are equal, round, and reactive to light.  Neck:     Thyroid: No thyroid mass or thyromegaly.     Vascular: No carotid bruit.     Trachea: Trachea normal.  Cardiovascular:     Rate and Rhythm: Normal rate and regular rhythm.     Pulses: Normal pulses.     Heart sounds: Normal heart sounds, S1 normal and S2 normal. No murmur. No friction rub. No gallop.   Pulmonary:     Effort: Pulmonary effort is normal. No tachypnea or respiratory distress.     Breath sounds:  Decreased breath sounds present.     Comments: On O2 Abdominal:     General: Bowel sounds are normal.     Palpations: Abdomen is soft.     Tenderness: There is no abdominal tenderness.  Musculoskeletal:     Cervical back: Normal range of motion and neck supple.  Skin:    General: Skin is warm and dry.     Findings: No rash.  Neurological:     Mental Status: She is alert.     Cranial Nerves: Cranial nerves are intact.     Sensory: Sensation is intact.     Motor: Motor function is intact.     Comments:  Strength in upper and lower extremities 5/5  Psychiatric:        Mood and Affect: Mood is not anxious or depressed.        Speech: Speech normal.        Behavior: Behavior normal. Behavior is cooperative.        Thought Content: Thought content normal.        Judgment: Judgment normal.      Assessment and Plan The patient's preventative maintenance and recommended screening tests for an annual wellness exam were reviewed in full today. Brought up to date unless services declined.  Counselled on the importance of diet, exercise, and its role in overall health and mortality. The patient's FH and SH was reviewed, including their home life, tobacco status, and drug and alcohol status.   Vaccines:Uptodate. Encouraged COVID vaccine Mammogram: last in 2009, not interested in continuing to follow.  DEXA: on fosamax (many years about 10 years) last checked 2009t -3.11.Marland Kitchen Stopped fosamax2012,  05/2016 femoral neck -2.7 After discussion.. she has chosen not to continue bone densities as she would not use a med to treat. Colon: Not interested in colon cancer screening. Discussed the risks and benefits of cancer screening... Pt voices understanding an chooses to not proceed with prevention as noted above.  " I am ready to go if the lord takes me" No indication for pap/DVE.    Malignant neoplasm of urinary bladder (HCC) S/P treatment.  Followed by urology q 6 months.  Chronic  hypoxemic respiratory failure (HCC) On continuous oxygen 4 L.    Patient suffers from severe COPD and respiratory failure which impairs their ability to perform daily activities like (dressing, grooming, bathing) in the  home. A cane, walker or crutch will not resolve issues. A transport chair will allow the patient to safely perform daily activities. Patient is unable to use a standard WC on their own and has a caregiver who is available, willing, and able to provide assistance with the transport chair    Centrilobular emphysema ([place]) Severe... extremely limited in mobility due to severe SOB. Gradually worsening in the last year.  On stiolto inhaler.  Essential hypertension, benign  Good control of BP on low dose losartan.  Hyperkalemia Re-eval.  Maybe due to losartan and high potassium diet. Not on supplements with K.  Senile purpura (HCC) Stable.  Prediabetes Resolved with diet.    Eliezer Lofts, MD

## 2019-10-30 NOTE — Assessment & Plan Note (Addendum)
On continuous oxygen 4 L.    Patient suffers from severe COPD and respiratory failure which impairs their ability to perform daily activities like (dressing, grooming, bathing) in the home. A cane, walker or crutch will not resolve issues. A transport chair will allow the patient to safely perform daily activities. Patient is unable to use a standard WC on their own and has a caregiver who is available, willing, and able to provide assistance with the transport chair

## 2019-10-30 NOTE — Assessment & Plan Note (Signed)
Re-eval.  Maybe due to losartan and high potassium diet. Not on supplements with K.

## 2019-10-30 NOTE — Assessment & Plan Note (Signed)
S/P treatment.  Followed by urology q 6 months.

## 2019-10-30 NOTE — Assessment & Plan Note (Addendum)
Severe... extremely limited in mobility due to severe SOB. Gradually worsening in the last year.  On stiolto inhaler.

## 2019-10-30 NOTE — Assessment & Plan Note (Signed)
Stable

## 2019-10-30 NOTE — Addendum Note (Signed)
Addended by: Carter Kitten on: 10/30/2019 12:02 PM   Modules accepted: Orders

## 2019-10-30 NOTE — Assessment & Plan Note (Signed)
Good control of BP on low dose losartan.

## 2019-10-30 NOTE — Patient Instructions (Signed)
Please stop at the lab to have labs drawn.  

## 2019-10-30 NOTE — Assessment & Plan Note (Signed)
Resolved with diet 

## 2019-11-03 ENCOUNTER — Telehealth: Payer: Self-pay | Admitting: Family Medicine

## 2019-11-03 ENCOUNTER — Other Ambulatory Visit: Payer: Self-pay | Admitting: Internal Medicine

## 2019-11-03 DIAGNOSIS — J9611 Chronic respiratory failure with hypoxia: Secondary | ICD-10-CM | POA: Diagnosis not present

## 2019-11-03 NOTE — Progress Notes (Signed)
  Chronic Care Management   Note  11/03/2019 Name: Misty Ferguson MRN: [id number] DOB: October 11, 1933  Misty Ferguson is a 86 y.o. year old female who is a primary care patient of [name], Amy E, MD. I reached out to Misty Ferguson by phone today in response to a referral sent by [name] PCP, [name], MD.   [name] was given information about Chronic Care Management services today including:  1. CCM service includes personalized support from designated clinical staff supervised by her physician, including individualized plan of care and coordination with other care providers 2. 24/7 contact phone numbers for assistance for urgent and routine care needs. 3. Service will only be billed when office clinical staff spend 20 minutes or more in a month to coordinate care. 4. Only one practitioner may furnish and bill the service in a calendar month. 5. The patient may stop CCM services at any time (effective at the end of the month) by phone call to the office staff.   Patient agreed to services and verbal consent obtained.   Follow up plan:   Raynicia Dukes [employer] Scheduler

## 2019-11-12 ENCOUNTER — Telehealth: Payer: Self-pay | Admitting: Internal Medicine

## 2019-11-12 MED ORDER — ALBUTEROL SULFATE (2.5 MG/3ML) 0.083% IN NEBU: 2.5000 mg | INHALATION_SOLUTION | Freq: Four times a day (QID) | RESPIRATORY_TRACT | 3 refills | Status: DC | PRN

## 2019-11-12 NOTE — Telephone Encounter (Signed)
Rx for albuterol was refilled to [facility]  I called the pt and left detailed msg that this was done

## 2019-11-18 IMAGING — CR DG CHEST 2V
2 series · 2 of 2 positions shown · non-contrast
Comparison: 10/24/2017

CLINICAL DATA: Preop bladder tumor.

EXAM:
CHEST - 2 VIEW

[chest pa]
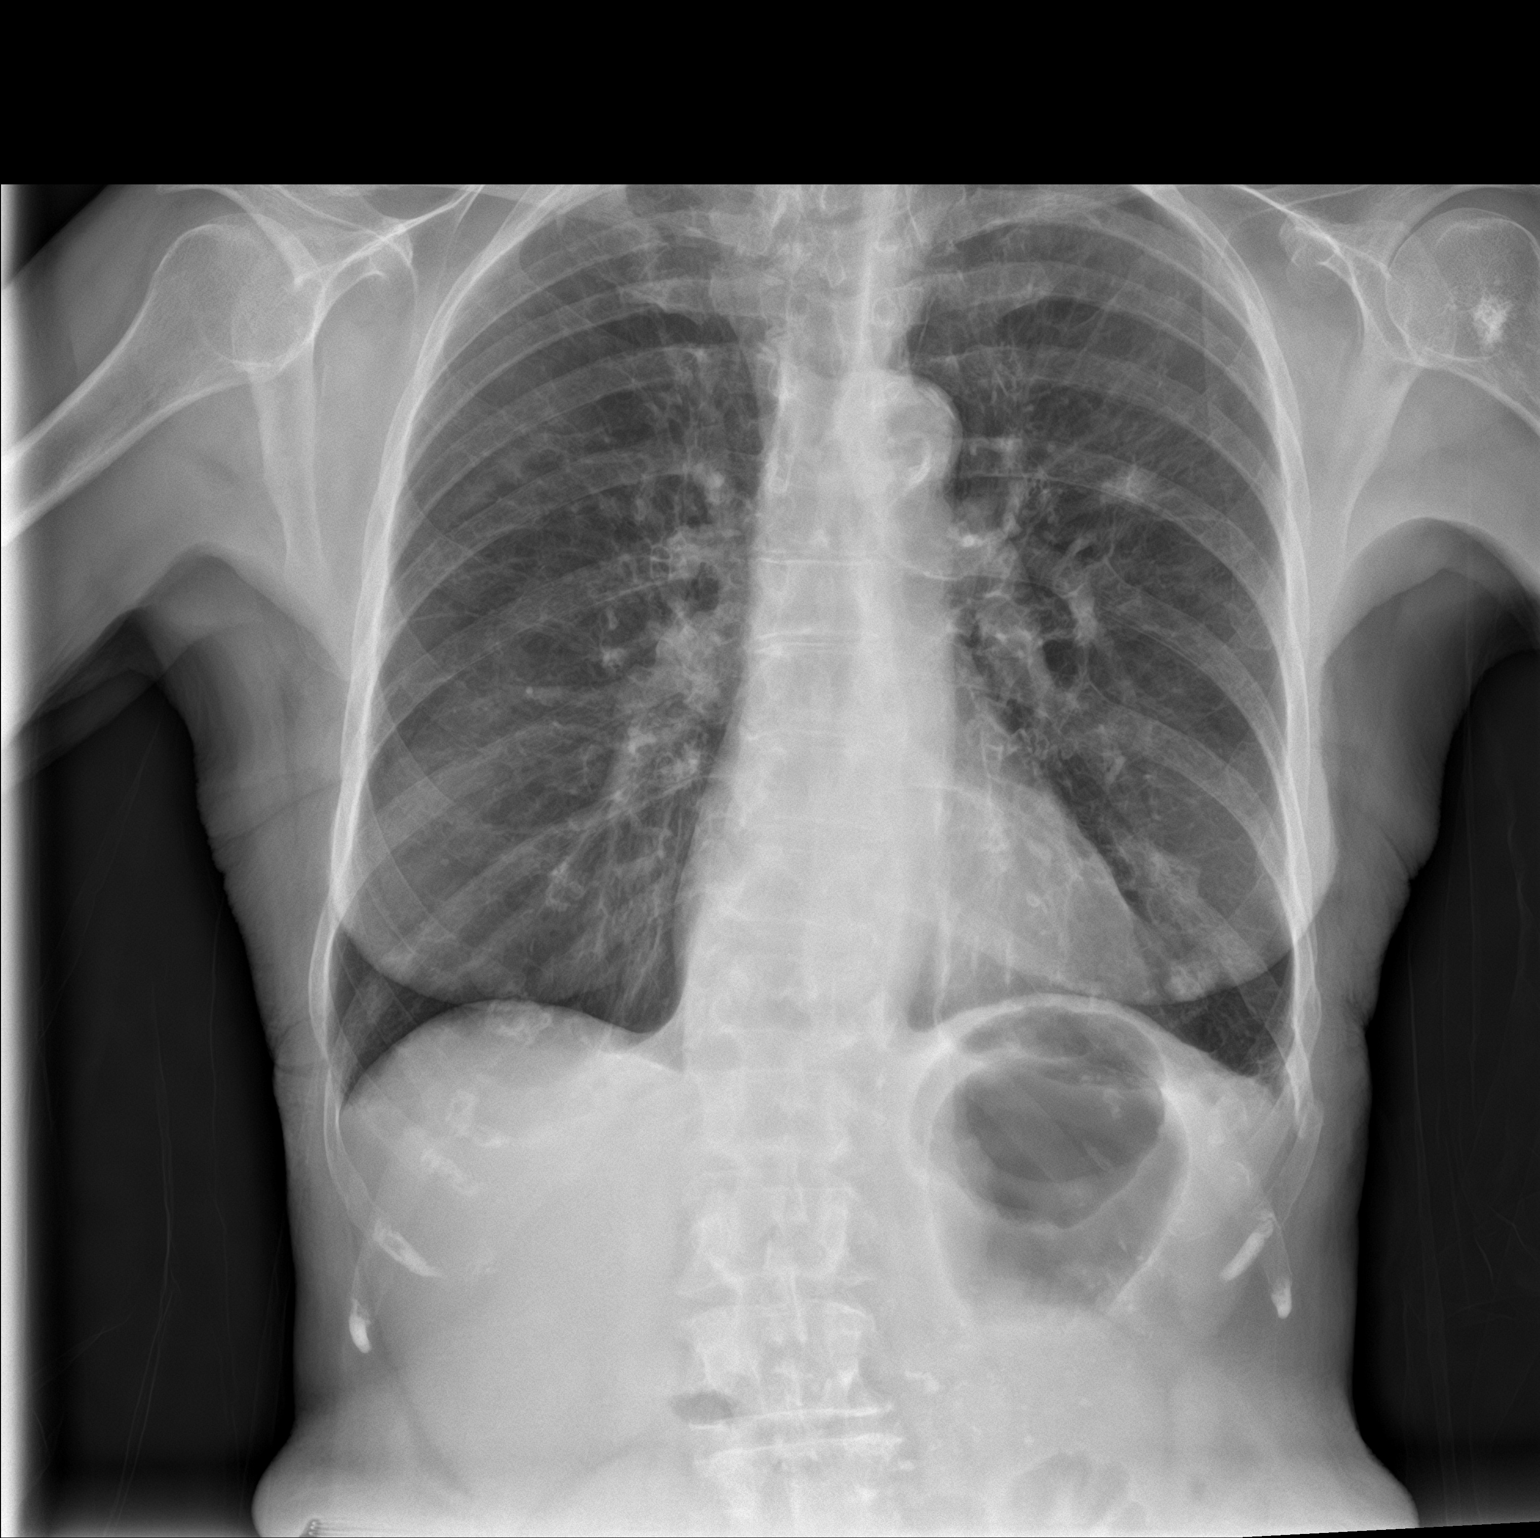

[chest lat]
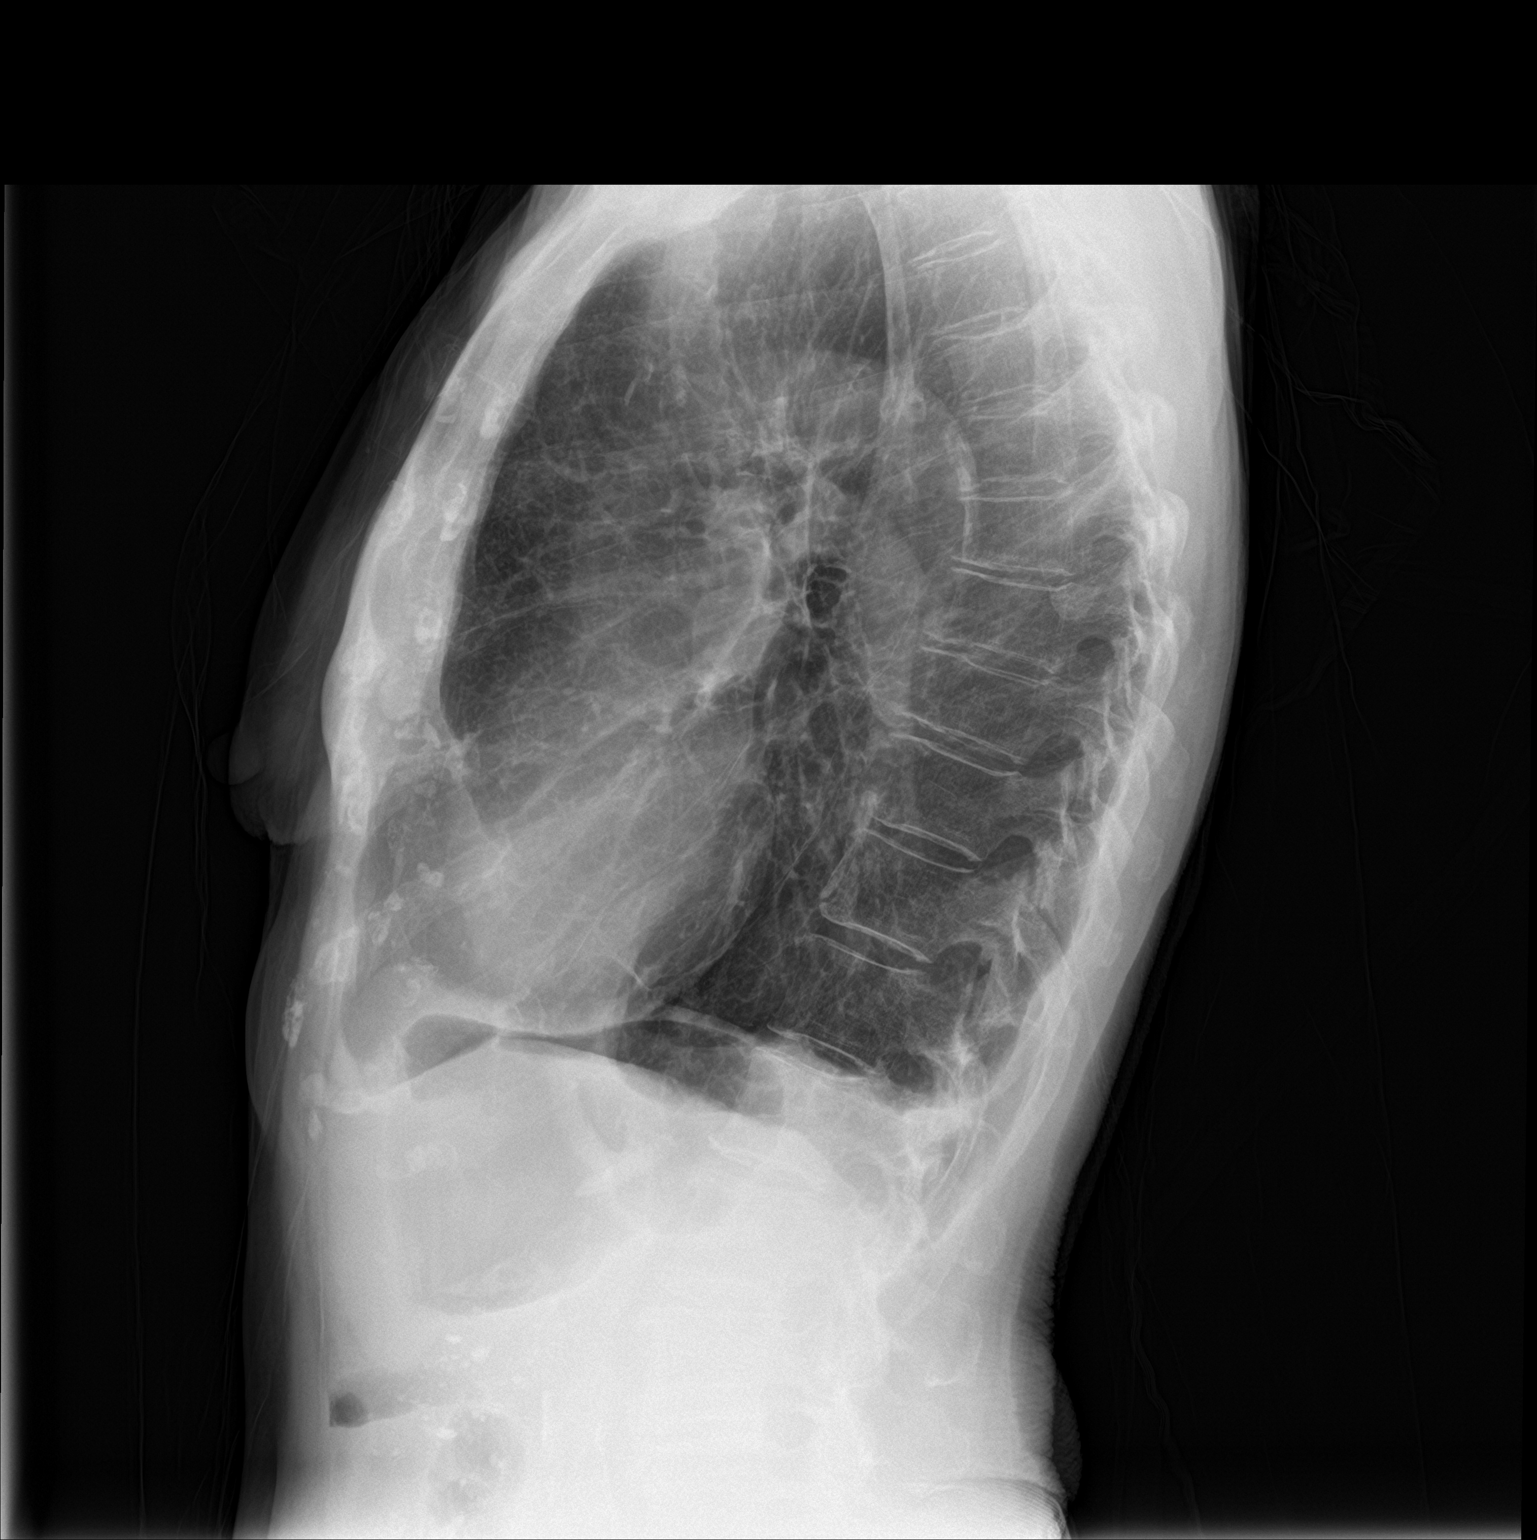

[2 of 2 positions shown; findings below may reference images not displayed]

FINDINGS: Heart is normal size. Increased markings in the lungs bilaterally,
felt represent scarring. No effusions. Nodular density projects over
the left upper lung in the perihilar region, new since prior study.
No confluent opacity on the right. No acute bony abnormality.
Sclerotic focus in the left humeral head is stable since prior
study, likely bone infarct or enchondroma.
IMPRESSION: Increased markings throughout the lungs, likely chronic lung
disease/scarring.

Nodular density projects over the left upper lobe. This could
reflect overlapping shadows, but recommend further evaluation with
CT.

## 2019-11-23 ENCOUNTER — Telehealth: Payer: Self-pay

## 2019-11-23 DIAGNOSIS — J9611 Chronic respiratory failure with hypoxia: Secondary | ICD-10-CM

## 2019-11-23 DIAGNOSIS — I10 Essential (primary) hypertension: Secondary | ICD-10-CM

## 2019-11-23 NOTE — Telephone Encounter (Signed)
Per written referral from PCP, requesting referral in Epic for Misty Ferguson to chronic care management pharmacy services for the following conditions:   Essential hypertension, benign  [I10]  Chronic hypoxemic respiratory failure [J96.11]  [name], PharmD Clinical Pharmacist [facility] [phone]

## 2019-11-23 NOTE — Telephone Encounter (Signed)
Please sign referral

## 2019-11-24 MED ORDER — PREGABALIN 75 MG PO CAPS: ORAL_CAPSULE | ORAL | 1 refills | Status: DC

## 2019-11-25 ENCOUNTER — Other Ambulatory Visit: Payer: Self-pay

## 2019-11-25 ENCOUNTER — Ambulatory Visit: Payer: Medicare HMO

## 2019-11-25 DIAGNOSIS — E7800 Pure hypercholesterolemia, unspecified: Secondary | ICD-10-CM

## 2019-11-25 DIAGNOSIS — I10 Essential (primary) hypertension: Secondary | ICD-10-CM

## 2019-11-25 DIAGNOSIS — J9611 Chronic respiratory failure with hypoxia: Secondary | ICD-10-CM

## 2019-11-25 NOTE — Chronic Care Management (AMB) (Signed)
Chronic Care Management Pharmacy  Name: Misty Ferguson  MRN: [id number] DOB: 05/22/1934  Chief Complaint/ HPI  Misty Ferguson,  86 y.o., female presents for their Initial CCM visit with the clinical pharmacist via telephone.  PCP : Misty Sanders, MD  Their chronic conditions include: hypertension, hyperlipidemia, COPD  Patient concerns: neuropathy is bothersome but denies interest in medication, concerned about adverse effects with potential treatment options (dry mouth, constipation, and blurred vision); tried gabapentin at low dose for 3-6 months; denies interest in Lyrica  --> May need to replace nebulizer, current is 84 years old and no longer effective  Office Visits:  11/20/19: Misty Ferguson (telephone) - consider diclofenac gel for nerve pain, pt denies interest in rx medications at this time   10/30/19: Misty Ferguson - severe COPD, followed by Misty Ferguson, wheelchair, chronic oxygen, assisted by caregiver, denies bone density scans and colon screening, bladder cancer, no med changes  Consult Visit:  08/25/19: Cardiology - note not available  No Known Allergies  Medications: Outpatient Encounter Medications as of 11/25/2019  Medication Sig  . albuterol (PROVENTIL HFA;VENTOLIN HFA) 108 (90 Base) MCG/ACT inhaler Inhale 2 puffs into the lungs every 6 (six) hours as needed for wheezing or shortness of breath.  Marland Kitchen albuterol (PROVENTIL) (2.5 MG/3ML) 0.083% nebulizer solution Take 3 mLs (2.5 mg total) by nebulization every 6 (six) hours as needed for wheezing or shortness of breath.  Marland Kitchen ascorbic acid (VITAMIN C) 1000 MG tablet Take 1,000 mg by mouth daily.   . Cholecalciferol (VITAMIN D) 125 MCG (5000 UT) CAPS Take 5,000 Units by mouth daily.  Marland Kitchen docusate sodium (COLACE) 100 MG capsule Take 100 mg by mouth 2 (two) times daily.  Marland Kitchen losartan (COZAAR) 25 MG tablet 25 mg daily.   . Multiple Vitamins-Minerals (PRESERVISION AREDS) TABS Take 1 tablet by mouth 2 (two) times daily.  . Omega-3 Fatty Acids  (FISH OIL) 1000 MG CAPS Take 1,000 mg by mouth 2 (two) times daily.   Marland Kitchen OVER THE COUNTER MEDICATION Take 1 tablet by mouth daily. Super Cleanser  . OXYGEN Inhale 3.5 L into the lungs 3 (three) times daily as needed (shortness of breath).  . silver sulfADIAZINE (SILVADENE) 1 % cream Apply 1 application topically daily.  Marland Kitchen STIOLTO RESPIMAT 2.5-2.5 MCG/ACT AERS INHALE 2 PUFFS INTO THE LUNGS DAILY.  . verapamil (VERELAN PM) 360 MG 24 hr capsule Take 360 mg by mouth daily.  . Vitamin E 180 MG CAPS Take 180 mg by mouth daily.  . pregabalin (LYRICA) 75 MG capsule 1 cap at bedtime, if no relief in 3 days, increase to twice daily. (Patient not taking: Reported on 11/30/2019)  . verapamil (CALAN-SR) 240 MG CR tablet 360 mg.    No facility-administered encounter medications on file as of 11/25/2019.   Current Diagnosis/Assessment:   Emergency planning/management officer Strain: Low Risk   . Difficulty of Paying Living Expenses: Not very hard   Goals    . Increase physical activity     Starting 10/04/2016, I will continue to exercise at least 40 min daily.     . Increase physical activity     Starting 10/11/2017, I will continue to walk on treadmill for 15-20 minutes 5 days per week.     . Patient Stated     10/28/2019, I will continue to ride my treadmill 5 days a week for 12 minutes.    . Pharmacy Care Plan     CARE PLAN ENTRY  Current Barriers:  . Chronic Disease Management  support, education, and care coordination needs related to vaccinations  Pharmacist Clinical Goal(s):  Marland Kitchen Over the next month, patient will work with PharmD and primary care provider to address the following goals: o Vaccinations: Remain up to date on vaccinations. Recommend Shingrix 2-dose series for shingles   Interventions: . Reviewed vaccine history and recommended vaccines . Attached vaccine information  Patient Self Care Activities:  . Check with local pharmacy for shingles vaccine administration  Initial goal documentation    .  Pharmacy Care Plan: COPD     CARE PLAN ENTRY  Current Barriers:  . Chronic Disease Management support, education, and care coordination needs related to COPD, cost of medications, nebulizer effectiveness   Pharmacist Clinical Goal(s):  Marland Kitchen Over the next 30 days, patient will work with PharmD and primary care provider on the following goals: o Improve shortness of breath o Resolve nebulizer concerns/need for replacement  Interventions: . Comprehensive medication review performed . Review insurance for cost of inhaled corticosteroid . Consult with PCP for nebulizer replacement   Patient Self Care Activities:  . Discuss steroid therapy with pulmonologist if you would like to consider additional therapy for COPD . Review patient assistance requirements and let pharmacist know if you would like to apply to reduce Stiolto cost  Initial goal documentation    . Pharmacy Care Plan: Hypertension     CARE PLAN ENTRY  Current Barriers:  . Controlled hypertension, complicated by hyperlipidemia . Current antihypertensive regimen:   Losartan 25 mg - 1 tablet daily  Verapamil 360 mg - 1 tablet daily  . Previous antihypertensives tried: none reported . Last practice recorded BP readings:  BP Readings from Last 3 Encounters:  10/30/19 132/60  10/28/19 (!) 144/81  10/14/19 (!) 167/89 .  Current home BP readings: 11/25/19: 132/83 mmHg; reports ranging around 140s/90 (checking infrequently)  Pharmacist Clinical Goal(s):  Marland Kitchen Over the next 30 days, patient will work with PharmD and providers on the following goals:  o Maintain blood pressure within 140/90 mmHg  Interventions: . Comprehensive medication review performed; medication list updated in the electronic medical record.   Patient Self Care Activities:  . Patient will continue to check BP monthly and when symptomatic, document, and provide at future appointments  Initial goal documentation        Hypertension   CMP Latest Ref Rng  & Units 10/30/2019 10/22/2019 09/23/2019  Glucose 70 - 99 mg/dL 106(H) 108(H) -  BUN 6 - 23 mg/dL 14 16 -  Creatinine 0.40 - 1.20 mg/dL 0.81 0.92 0.90  Sodium 135 - 145 mEq/L 140 140 -  Potassium 3.5 - 5.1 mEq/L 4.9 5.2(H) -  Chloride 96 - 112 mEq/L 102 103 -  CO2 19 - 32 mEq/L 31 30 -  Calcium 8.4 - 10.5 mg/dL 9.9 9.4 -  Total Protein 6.0 - 8.3 g/dL - 6.3 -  Total Bilirubin 0.2 - 1.2 mg/dL - 0.5 -  Alkaline Phos 39 - 117 U/L - 82 -  AST 0 - 37 U/L - 26 -  ALT 0 - 35 U/L - 24 -   Office blood pressures are: BP Readings from Last 3 Encounters:  10/30/19 132/60  10/28/19 (!) 144/81  10/14/19 (!) 167/89   BP today: (repeated three times)  146/83, pulse 95; right arm, without oxygen  159/89, pulse 85; right arm, with oxygen   132/83 - (third check, 5 minutes later)  BP goal < 140/90 mmHg Patient has failed these meds in the past: none  Patient checks  BP at home infrequently (has arm cuff - home monitor) Patient home BP readings are ranging: 140/90 mmHg - reports minimal change since decreasing losartan, reports BP is within goal as long as she is seated and resting   Patient is currently controlled on the following medications:   Losartan 25 mg - 1 tablet daily  Verapamil 360 mg - 1 tablet daily (cardiology)  We discussed: recently decreased losartan 50 mg to 25 mg due to hyperkalemia, potassium WNL  Plan: Continue current medications; Check home BP monthly   Hyperlipidemia   Lipid Panel     Component Value Date/Time   CHOL 240 (H) 10/22/2019 0737   TRIG 69.0 10/22/2019 0737   HDL 125.20 10/22/2019 0737   CHOLHDL 2 10/22/2019 0737   VLDL 13.8 10/22/2019 0737   LDLCALC 101 (H) 10/22/2019 0737   LDLDIRECT 99.1 09/08/2013 0801    LDL goal < 100 Patient has failed these meds in past: none reported Patient is currently controlled on the following medications:   Omega 3 fatty acids 1000 mg - 1 tablet BID  We discussed: denies CV history   Plan: Continue current  medications  COPD / Tobacco   Followed by pulmonology  Last spirometry score: 01/2012 FEV1 46% Gold Grade: Gold 3 (FEV1 30-49%)  Eosinophil count:   Lab Results  Component Value Date/Time   EOSPCT 2 09/08/2017 11:16 AM  %                               Eos (Absolute):  Lab Results  Component Value Date/Time   EOSABS 0.2 04/30/2019 04:13 PM   Tobacco Status:  Social History   Tobacco Use  Smoking Status Former Smoker  . Packs/day: 1.30  . Years: 45.00  . Pack years: 58.50  . Types: Cigarettes  . Quit date: 08/14/2003  . Years since quitting: 16.3  Smokeless Tobacco Never Used   Patient has failed these meds in past: none  Patient is currently controlled on the following medications:   Stiolto 2.5-2.5 mcg - 2 puffs daily (AM)  Albuterol nebulizer - PRN   Supplemental Oxygen - PRN  Using maintenance inhaler regularly? Yes Frequency of rescue inhaler use: usually uses 1-2 times a day, but thinks nebulizer is not working as well as it used to  We discussed: changed from Advair and Spiriva to [employer] due to cost about 3 years ago, we discussed potentially adding an inhaled steroid to improve SOB (Pulmicort, QVar, Asmanex, etc.) --> pt will discuss with pulmonologist at next visit  Plan: Continue current medications; Review cost of inhaled steroid with insurance plan.  Vaccines   Reviewed and discussed patient's vaccination history.    Immunization History  Administered Date(s) Administered  . Fluad Quad(high Dose 65+) 04/09/2019  . H1N1 10/04/2008  . Influenza Split 04/21/2011, 05/12/2012  . Influenza Whole 05/27/2007, 05/08/2008, 05/04/2009, 04/26/2010  . Influenza,inj,Quad PF,6+ Mos 05/06/2013, 05/06/2014, 04/21/2015, 05/11/2016, 04/17/2017, 05/01/2018  . Pneumococcal Conjugate-13 09/21/2014  . Pneumococcal Polysaccharide-23 03/13/2006, 06/28/2011  . Td 03/13/2006  . Tdap 10/04/2016  . Zoster 09/05/2010   Plan: Recommended patient receive Shingrix    Medication Management  Misc: Silvadene 1% daily PRN  OTCs: vitamin E 180 mg - daily, vitamin C 1000 mg - daily, vitamin D 5000 IU (125 mcg) - daily, docusate 100 mg - BID, natural fiber, super cleanser - 1 daily, Preservision - BID, Dry Eye drops daily   Pharmacy/Benefits: [facility] and  [facility]   Adherence: no concerns   Social support: able to afford basic necessities   Affordability: OTC credit through [place], [place] $138 90 DS, small copay with all other meds; concerned as already in donut hole this year --> send PAP requirements  CCM Follow Up: 12/28/19 at 10:00 AM (telephone call to discuss COPD, nebulizer, PAP)   Misty Ferguson, PharmD Clinical Pharmacist [facility] at [facility] [phone]

## 2019-12-01 ENCOUNTER — Telehealth: Payer: Self-pay

## 2019-12-01 DIAGNOSIS — J432 Centrilobular emphysema: Secondary | ICD-10-CM

## 2019-12-01 NOTE — Patient Instructions (Addendum)
Dear Misty Ferguson,  It was a pleasure meeting you during our initial appointment on November 25, 2019. Below is a summary of the goals we discussed and components of chronic care management. Please contact me anytime with questions or concerns.   Visit Information  Goals    . Pharmacy Care Plan     CARE PLAN ENTRY  Current Barriers:  . Chronic Disease Management support, education, and care coordination needs related to vaccinations  Pharmacist Clinical Goal(s):  Marland Kitchen Over the next month, patient will work with PharmD and primary care provider to address the following goals: o Vaccinations: Remain up to date on vaccinations. Recommend Shingrix 2-dose series for shingles   Interventions: . Reviewed vaccine history and recommended vaccines . Attached vaccine information  Patient Self Care Activities:  . Check with local pharmacy for shingles vaccine administration  Initial goal documentation    . Pharmacy Care Plan: COPD     CARE PLAN ENTRY  Current Barriers:  . Chronic Disease Management support, education, and care coordination needs related to COPD, cost of medications, nebulizer effectiveness   Pharmacist Clinical Goal(s):  Marland Kitchen Over the next 30 days, patient will work with PharmD and primary care provider on the following goals: o Improve shortness of breath o Resolve nebulizer concerns/need for replacement  Interventions: . Comprehensive medication review performed . Review insurance for cost of inhaled corticosteroid . Consult with PCP for nebulizer replacement   Patient Self Care Activities:  . Discuss steroid therapy with pulmonologist if you would like to consider additional therapy for COPD . Review patient assistance requirements (see attached) and let pharmacist know if you would like to apply to reduce Stiolto cost  Initial goal documentation    . Pharmacy Care Plan: Hypertension     CARE PLAN ENTRY  Current Barriers:  . Controlled hypertension, complicated by  hyperlipidemia . Current antihypertensive regimen:   Losartan 25 mg - 1 tablet daily  Verapamil 360 mg - 1 tablet daily  . Previous antihypertensives tried: none reported . Last practice recorded BP readings:  BP Readings from Last 3 Encounters:  10/30/19 132/60  10/28/19 (!) 144/81  10/14/19 (!) 167/89 .  Current home BP readings: 11/25/19: 132/83 mmHg; reports ranging around 140s/90 (checking infrequently)  Pharmacist Clinical Goal(s):  Marland Kitchen Over the next 30 days, patient will work with PharmD and providers on the following goals:  o Maintain blood pressure within 140/90 mmHg  Interventions: . Comprehensive medication review performed; medication list updated in the electronic medical record.   Patient Self Care Activities:  . Patient will continue to check BP monthly and when symptomatic, document, and provide at future appointments  Initial goal documentation       Misty Ferguson was given information about Chronic Care Management services today including:  1. CCM service includes personalized support from designated clinical staff supervised by her physician, including individualized plan of care and coordination with other care providers 2. 24/7 contact phone numbers for assistance for urgent and routine care needs. 3. Standard insurance, coinsurance, copays and deductibles apply for chronic care management only during months in which we provide at least 20 minutes of these services. Most insurances cover these services at 100%, however patients may be responsible for any copay, coinsurance and/or deductible if applicable. This service may help you avoid the need for more expensive face-to-face services. 4. Only one practitioner may furnish and bill the service in a calendar month. 5. The patient may stop CCM services at any time (effective  at the end of the month) by phone call to the office staff.  Patient agreed to services and verbal consent obtained.   The patient verbalized  understanding of instructions provided today and agreed to receive a mailed copy of patient instruction and/or educational materials. Telephone follow up appointment with pharmacy team member scheduled for: 12/28/19 at 10:00 AM (telephone)  [name], PharmD Clinical Pharmacist [facility] at [facility] [phone]  Zoster Vaccine, Recombinant injection What is this medicine? ZOSTER VACCINE (ZOS ter vak SEEN) is used to prevent shingles in adults 84 years old and over. This vaccine is not used to treat shingles or nerve pain from shingles. This medicine may be used for other purposes; ask your health care provider or pharmacist if you have questions. COMMON BRAND NAME(S): [facility] What should I tell my health care provider before I take this medicine? They need to know if you have any of these conditions:  blood disorders or disease  cancer like leukemia or lymphoma  immune system problems or therapy  an unusual or allergic reaction to vaccines, other medications, foods, dyes, or preservatives  pregnant or trying to get pregnant  breast-feeding How should I use this medicine? This vaccine is for injection in a muscle. It is given by a health care professional. Talk to your pediatrician regarding the use of this medicine in children. This medicine is not approved for use in children. Overdosage: If you think you have taken too much of this medicine contact a poison control center or emergency room at once. NOTE: This medicine is only for you. Do not share this medicine with others. What if I miss a dose? Keep appointments for follow-up (booster) doses as directed. It is important not to miss your dose. Call your doctor or health care professional if you are unable to keep an appointment. What may interact with this medicine?  medicines that suppress your immune system  medicines to treat cancer  steroid medicines like prednisone or cortisone This list may not  describe all possible interactions. Give your health care provider a list of all the medicines, herbs, non-prescription drugs, or dietary supplements you use. Also tell them if you smoke, drink alcohol, or use illegal drugs. Some items may interact with your medicine. What should I watch for while using this medicine? Visit your doctor for regular check ups. This vaccine, like all vaccines, may not fully protect everyone. What side effects may I notice from receiving this medicine? Side effects that you should report to your doctor or health care professional as soon as possible:  allergic reactions like skin rash, itching or hives, swelling of the face, lips, or tongue  breathing problems Side effects that usually do not require medical attention (report these to your doctor or health care professional if they continue or are bothersome):  chills  headache  fever  nausea, vomiting  redness, warmth, pain, swelling or itching at site where injected  tiredness This list may not describe all possible side effects. Call your doctor for medical advice about side effects. You may report side effects to FDA at 1-800-FDA-1088. Where should I keep my medicine? This vaccine is only given in a clinic, pharmacy, doctor's office, or other health care setting and will not be stored at home. NOTE: This sheet is a summary. It may not cover all possible information. If you have questions about this medicine, talk to your doctor, pharmacist, or health care provider.  2020 Elsevier/Gold Standard (2017-03-11 13:20:30)

## 2019-12-01 NOTE — Telephone Encounter (Signed)
Patient is concerned her nebulizer is not working anymore. She has used it for more than 20 years. Would it be possible for her to get a new one?  [name], PharmD Clinical Pharmacist [facility] at [facility] [phone]

## 2019-12-01 NOTE — Telephone Encounter (Signed)
Community message sent to [place] in regards to the nebulizer machine.

## 2019-12-01 NOTE — Telephone Encounter (Signed)
DME prescription placed for nebulizer Misty Ferguson: Please let pt know.  CC: [name]

## 2019-12-04 DIAGNOSIS — J9611 Chronic respiratory failure with hypoxia: Secondary | ICD-10-CM | POA: Diagnosis not present

## 2019-12-14 DIAGNOSIS — J449 Chronic obstructive pulmonary disease, unspecified: Secondary | ICD-10-CM | POA: Diagnosis not present

## 2019-12-25 ENCOUNTER — Ambulatory Visit: Payer: Medicare HMO

## 2019-12-25 ENCOUNTER — Other Ambulatory Visit: Payer: Self-pay

## 2019-12-25 DIAGNOSIS — I10 Essential (primary) hypertension: Secondary | ICD-10-CM

## 2019-12-25 DIAGNOSIS — J9611 Chronic respiratory failure with hypoxia: Secondary | ICD-10-CM

## 2019-12-25 NOTE — Chronic Care Management (AMB) (Signed)
Chronic Care Management Pharmacy  Name: Misty Ferguson  MRN: [id number] DOB: [birth year]  Chief Complaint/ HPI  Misty Ferguson,  86 y.o., female presents for their Follow-Up CCM visit with the clinical pharmacist via telephone.  PCP : [name], MD  Their chronic conditions include: hypertension, hyperlipidemia, COPD  Office Visits: none since last CCM visit on 11/25/19  No Known Allergies  Medications: Outpatient Encounter Medications as of 12/25/2019  Medication Sig  . albuterol (PROVENTIL HFA;VENTOLIN HFA) 108 (90 Base) MCG/ACT inhaler Inhale 2 puffs into the lungs every 6 (six) hours as needed for wheezing or shortness of breath.  Marland Kitchen albuterol (PROVENTIL) (2.5 MG/3ML) 0.083% nebulizer solution Take 3 mLs (2.5 mg total) by nebulization every 6 (six) hours as needed for wheezing or shortness of breath.  Marland Kitchen ascorbic acid (VITAMIN C) 1000 MG tablet Take 1,000 mg by mouth daily.   . Cholecalciferol (VITAMIN D) 125 MCG (5000 UT) CAPS Take 5,000 Units by mouth daily.  Marland Kitchen docusate sodium (COLACE) 100 MG capsule Take 100 mg by mouth 2 (two) times daily.  Marland Kitchen losartan (COZAAR) 25 MG tablet 25 mg daily.   . Multiple Vitamins-Minerals (PRESERVISION AREDS) TABS Take 1 tablet by mouth 2 (two) times daily.  . Omega-3 Fatty Acids (FISH OIL) 1000 MG CAPS Take 1,000 mg by mouth 2 (two) times daily.   Marland Kitchen OVER THE COUNTER MEDICATION Take 1 tablet by mouth daily. Super Cleanser  . OXYGEN Inhale 3.5 L into the lungs 3 (three) times daily as needed (shortness of breath).  . pregabalin (LYRICA) 75 MG capsule 1 cap at bedtime, if no relief in 3 days, increase to twice daily. (Patient not taking: Reported on 11/30/2019)  . silver sulfADIAZINE (SILVADENE) 1 % cream Apply 1 application topically daily.  Marland Kitchen STIOLTO RESPIMAT 2.5-2.5 MCG/ACT AERS INHALE 2 PUFFS INTO THE LUNGS DAILY.  . verapamil (CALAN-SR) 240 MG CR tablet 360 mg.   . verapamil (VERELAN PM) 360 MG 24 hr capsule Take 360 mg by mouth daily.  .  Vitamin E 180 MG CAPS Take 180 mg by mouth daily.   No facility-administered encounter medications on file as of 12/25/2019.   Current Diagnosis/Assessment:   Emergency planning/management officer Strain: Low Risk   . Difficulty of Paying Living Expenses: Not very hard   Goals    . Increase physical activity     Starting 10/04/2016, I will continue to exercise at least 40 min daily.     . Increase physical activity     Starting 10/11/2017, I will continue to walk on treadmill for 15-20 minutes 5 days per week.     . Patient Stated     10/28/2019, I will continue to ride my treadmill 5 days a week for 12 minutes.    . Pharmacy Care Plan     CARE PLAN ENTRY  Current Barriers:  . Chronic Disease Management support, education, and care coordination needs related to vaccinations  Pharmacist Clinical Goal(s):  Marland Kitchen Over the next month, patient will work with PharmD and primary care provider to address the following goals: o Vaccinations: Remain up to date on vaccinations. Recommend Shingrix 2-dose series for shingles   Interventions: . Reviewed vaccine history and recommended vaccines . Attached vaccine information  Patient Self Care Activities:  . Check with local pharmacy for shingles vaccine administration  Initial goal documentation    . Pharmacy Care Plan: COPD     CARE PLAN ENTRY  Current Barriers:  . Chronic Disease Management support, education,  and care coordination needs related to COPD  Pharmacist Clinical Goal(s):  Marland Kitchen Over the next 6 months, patient will work with PharmD and primary care provider on the following goals: o Improve shortness of breath  Interventions: . Comprehensive medication review performed . Contact insurance to review cost of Stiolto alternatives - Per insurance, Stiolto is the preferred and lowest cost option in the drug class, Anoro is not covered. . Discussed patient assistance options - patient declined application today   Patient Self Care Activities:   . Discuss steroid therapy with pulmonologist if you would like to consider additional therapy for COPD  Goals updated, see previous for prior entries    . Pharmacy Care Plan: Hypertension     CARE PLAN ENTRY  Current Barriers:  . Controlled hypertension, complicated by hyperlipidemia . Current antihypertensive regimen:   Losartan 25 mg - 1 tablet daily  Verapamil 360 mg - 1 tablet daily  . Previous antihypertensives tried: none reported . Last practice recorded BP readings:  BP Readings from Last 3 Encounters:  10/30/19 132/60  10/28/19 (!) 144/81  10/14/19 (!) 167/89 .  Current home BP readings: 11/25/19: 132/83 mmHg; reports ranging around 140s/90 (checking infrequently)  Pharmacist Clinical Goal(s):  Marland Kitchen Over the next 6 months, patient will work with PharmD and providers on the following goals:  o Maintain blood pressure within 140/90 mmHg  Interventions: . Comprehensive medication review performed; medication list updated in the electronic medical record.   Patient Self Care Activities:  . Patient will continue to check BP monthly and when symptomatic, document, and provide at future appointments  Please see past updates related to this goal by clicking on the "Past Updates" button in the selected goal       Hyperlipidemia   Lipid Panel     Component Value Date/Time   CHOL 240 (H) 10/22/2019 0737   TRIG 69.0 10/22/2019 0737   HDL 125.20 10/22/2019 0737   CHOLHDL 2 10/22/2019 0737   VLDL 13.8 10/22/2019 0737   LDLCALC 101 (H) 10/22/2019 0737   LDLDIRECT 99.1 09/08/2013 0801    LDL goal < 100 Patient has failed these meds in past: none reported Patient is currently controlled on the following medications:   Omega 3 fatty acids 1000 mg - 1 tablet BID  We discussed: denies CV history   Plan: Continue current medications  COPD / Tobacco   Followed by pulmonology  Last spirometry score: 01/2012 FEV1 46% Gold Grade: Gold 3 (FEV1 30-49%)  Eosinophil count:    Lab Results  Component Value Date/Time   EOSPCT 2 09/08/2017 11:16 AM  %                               Eos (Absolute):  Lab Results  Component Value Date/Time   EOSABS 0.2 04/30/2019 04:13 PM   Tobacco Status:  Social History   Tobacco Use  Smoking Status Former Smoker  . Packs/day: 1.30  . Years: 45.00  . Pack years: 58.50  . Types: Cigarettes  . Quit date: 08/14/2003  . Years since quitting: 16.3  Smokeless Tobacco Never Used   Patient has tried these meds in past: changed from Advair and Spiriva to Stiolto due to cost about 3 years ago, Patient is currently controlled on the following medications:   Stiolto 2.5-2.5 mcg - 2 puffs daily (AM)  Albuterol nebulizer - PRN   Supplemental Oxygen - PRN  Using maintenance inhaler regularly? Yes  Frequency of nebulizer use: daily   We discussed: Patient received new nebulizer and reports it is working much better, has noticed improved SOB. We discussed applying for PAP for Stiolto and pt politely declined. I offered to contact insurance to see if any less expensive alternatives (Anoro - not covered) and patient would appreciate this. Per insurance, Stiolto is tier 3 without any less expensive alternatives and no tier exceptions are permitted.   Items still pending from initial visit: we discussed potentially adding an inhaled steroid to improve SOB (Pulmicort, QVar, Asmanex, etc.) --> pt will discuss with pulmonologist at next visit  Plan: Continue current medications  Vaccines   Reviewed and discussed patient's vaccination history.    Immunization History  Administered Date(s) Administered  . Fluad Quad(high Dose 65+) 04/09/2019  . H1N1 10/04/2008  . Influenza Split 04/21/2011, 05/12/2012  . Influenza Whole 05/27/2007, 05/08/2008, 05/04/2009, 04/26/2010  . Influenza,inj,Quad PF,6+ Mos 05/06/2013, 05/06/2014, 04/21/2015, 05/11/2016, 04/17/2017, 05/01/2018  . Pneumococcal Conjugate-13 09/21/2014  . Pneumococcal  Polysaccharide-23 03/13/2006, 06/28/2011  . Td 03/13/2006  . Tdap 10/04/2016  . Zoster 09/05/2010   Plan: Recommended patient receive Shingrix   Medication Management  Pharmacy/Benefits: [employer] and [facility]   Adherence: no concerns   Affordability: Stiolto - reviewed assistance options  CCM Follow Up: 07/04/20 at 9:00 AM (telephone)  [name], PharmD Clinical Pharmacist [facility] at [facility] [phone]

## 2019-12-25 NOTE — Patient Instructions (Addendum)
Dear Misty Ferguson,  Below is a summary of the goals we discussed at our telephone follow up on Dec 25, 2019. Please contact me anytime with questions or concerns.   Visit Information  Goals Addressed            This Visit's Progress   . Pharmacy Care Plan: COPD       CARE PLAN ENTRY  Current Barriers:  . Chronic Disease Management support, education, and care coordination needs related to COPD  Pharmacist Clinical Goal(s):  Marland Kitchen Over the next 6 months, patient will work with PharmD and primary care provider on the following goals: o Improve shortness of breath  Interventions: . Comprehensive medication review performed . Contact insurance to review cost of Stiolto alternatives - Per insurance, Stiolto is the preferred and lowest cost option in the drug class, Anoro is not covered. . Discussed patient assistance options - patient declined application today   Patient Self Care Activities:  . Discuss steroid therapy with pulmonologist if you would like to consider additional therapy for COPD  Goals updated, see previous for prior entries    . Pharmacy Care Plan: Hypertension       CARE PLAN ENTRY  Current Barriers:  . Controlled hypertension, complicated by hyperlipidemia . Current antihypertensive regimen:   Losartan 25 mg - 1 tablet daily  Verapamil 360 mg - 1 tablet daily  . Previous antihypertensives tried: none reported . Last practice recorded BP readings:  BP Readings from Last 3 Encounters:  10/30/19 132/60  10/28/19 (!) 144/81  10/14/19 (!) 167/89 .  Current home BP readings: 11/25/19: 132/83 mmHg; reports ranging around 140s/90 (checking infrequently)  Pharmacist Clinical Goal(s):  Marland Kitchen Over the next 6 months, patient will work with PharmD and providers on the following goals:  o Maintain blood pressure within 140/90 mmHg  Interventions: . Comprehensive medication review performed; medication list updated in the electronic medical record.   Patient Self Care  Activities:  . Patient will continue to check BP monthly and when symptomatic, document, and provide at future appointments  Please see past updates related to this goal by clicking on the "Past Updates" button in the selected goal       The patient verbalized understanding of instructions provided today and agreed to receive a mailed copy of patient instruction and/or educational materials. Telephone follow up appointment with pharmacy team member scheduled for: 07/04/20 at 9:00 AM (telephone)  [name], PharmD Clinical Pharmacist [facility] at [facility] [phone]  Chronic Obstructive Pulmonary Disease Exacerbation Chronic obstructive pulmonary disease (COPD) is a long-term (chronic) lung problem. In COPD, the flow of air from the lungs is limited. COPD exacerbations are times that breathing gets worse and you need more than your normal treatment. Without treatment, they can be life threatening. If they happen often, your lungs can become more damaged. If your COPD gets worse, your doctor may treat you with:  Medicines.  Oxygen.  Different ways to clear your airway, such as using a mask. Follow these instructions at home: Medicines  Take over-the-counter and prescription medicines only as told by your doctor.  If you take an antibiotic or steroid medicine, do not stop taking the medicine even if you start to feel better.  Keep up with shots (vaccinations) as told by your doctor. Be sure to get a yearly (annual) flu shot. Lifestyle  Do not smoke. If you need help quitting, ask your doctor.  Eat healthy foods.  Exercise regularly.  Get plenty  of sleep.  Avoid tobacco smoke and other things that can bother your lungs.  Wash your hands often with soap and water. This will help keep you from getting an infection. If you cannot use soap and water, use hand sanitizer.  During flu season, avoid areas that are crowded with people. General  instructions  Drink enough fluid to keep your pee (urine) clear or pale yellow. Do not do this if your doctor has told you not to.  Use a cool mist machine (vaporizer).  If you use oxygen or a machine that turns medicine into a mist (nebulizer), continue to use it as told.  Follow all instructions for rehabilitation. These are steps you can take to make your body work better.  Keep all follow-up visits as told by your doctor. This is important. Contact a doctor if:  Your COPD symptoms get worse than normal. Get help right away if:  You are short of breath and it gets worse.  You have trouble talking.  You have chest pain.  You cough up blood.  You have a fever.  You keep throwing up (vomiting).  You feel weak or you pass out (faint).  You feel confused.  You are not able to sleep because of your symptoms.  You are not able to do daily activities. Summary  COPD exacerbations are times that breathing gets worse and you need more treatment than normal.  COPD exacerbations can be very serious and may cause your lungs to become more damaged.  Do not smoke. If you need help quitting, ask your doctor.  Stay up-to-date on your shots. Get a flu shot every year. This information is not intended to replace advice given to you by your health care provider. Make sure you discuss any questions you have with your health care provider. Document Revised: 07/12/2017 Document Reviewed: 09/03/2016 Elsevier Patient Education  2020 [employer].

## 2019-12-25 NOTE — Chronic Care Management (AMB) (Deleted)
Chronic Care Management Pharmacy  Name: Misty Ferguson  MRN: [id number] DOB: 1934/06/10  Chief Complaint/ HPI  Misty Ferguson,  86 y.o., female presents for their Follow-Up CCM visit with the clinical pharmacist via telephone.  PCP : [name], MD  Their chronic conditions include: hypertension, hyperlipidemia, COPD  Office Visits: none since previous CCM visit on 11/25/19  No Known Allergies  Medications: Outpatient Encounter Medications as of 12/28/2019  Medication Sig  . albuterol (PROVENTIL HFA;VENTOLIN HFA) 108 (90 Base) MCG/ACT inhaler Inhale 2 puffs into the lungs every 6 (six) hours as needed for wheezing or shortness of breath.  Marland Kitchen albuterol (PROVENTIL) (2.5 MG/3ML) 0.083% nebulizer solution Take 3 mLs (2.5 mg total) by nebulization every 6 (six) hours as needed for wheezing or shortness of breath.  Marland Kitchen ascorbic acid (VITAMIN C) 1000 MG tablet Take 1,000 mg by mouth daily.   . Cholecalciferol (VITAMIN D) 125 MCG (5000 UT) CAPS Take 5,000 Units by mouth daily.  Marland Kitchen docusate sodium (COLACE) 100 MG capsule Take 100 mg by mouth 2 (two) times daily.  Marland Kitchen losartan (COZAAR) 25 MG tablet 25 mg daily.   . Multiple Vitamins-Minerals (PRESERVISION AREDS) TABS Take 1 tablet by mouth 2 (two) times daily.  . Omega-3 Fatty Acids (FISH OIL) 1000 MG CAPS Take 1,000 mg by mouth 2 (two) times daily.   Marland Kitchen OVER THE COUNTER MEDICATION Take 1 tablet by mouth daily. Super Cleanser  . OXYGEN Inhale 3.5 L into the lungs 3 (three) times daily as needed (shortness of breath).  . pregabalin (LYRICA) 75 MG capsule 1 cap at bedtime, if no relief in 3 days, increase to twice daily. (Patient not taking: Reported on 11/30/2019)  . silver sulfADIAZINE (SILVADENE) 1 % cream Apply 1 application topically daily.  Marland Kitchen STIOLTO RESPIMAT 2.5-2.5 MCG/ACT AERS INHALE 2 PUFFS INTO THE LUNGS DAILY.  . verapamil (CALAN-SR) 240 MG CR tablet 360 mg.   . verapamil (VERELAN PM) 360 MG 24 hr capsule Take 360 mg by mouth daily.    . Vitamin E 180 MG CAPS Take 180 mg by mouth daily.   No facility-administered encounter medications on file as of 12/28/2019.   Current Diagnosis/Assessment:   Emergency planning/management officer Strain: Low Risk   . Difficulty of Paying Living Expenses: Not very hard   Goals    . Increase physical activity     Starting 10/04/2016, I will continue to exercise at least 40 min daily.     . Increase physical activity     Starting 10/11/2017, I will continue to walk on treadmill for 15-20 minutes 5 days per week.     . Patient Stated     10/28/2019, I will continue to ride my treadmill 5 days a week for 12 minutes.    . Pharmacy Care Plan     CARE PLAN ENTRY  Current Barriers:  . Chronic Disease Management support, education, and care coordination needs related to vaccinations  Pharmacist Clinical Goal(s):  Marland Kitchen Over the next month, patient will work with PharmD and primary care provider to address the following goals: o Vaccinations: Remain up to date on vaccinations. Recommend Shingrix 2-dose series for shingles   Interventions: . Reviewed vaccine history and recommended vaccines . Attached vaccine information  Patient Self Care Activities:  . Check with local pharmacy for shingles vaccine administration  Initial goal documentation    . Pharmacy Care Plan: COPD     CARE PLAN ENTRY  Current Barriers:  . Chronic Disease Management support,  education, and care coordination needs related to COPD, cost of medications, nebulizer effectiveness   Pharmacist Clinical Goal(s):  Marland Kitchen Over the next 30 days, patient will work with PharmD and primary care provider on the following goals: o Improve shortness of breath o Resolve nebulizer concerns/need for replacement  Interventions: . Comprehensive medication review performed . Review insurance for cost of inhaled corticosteroid . Consult with PCP for nebulizer replacement   Patient Self Care Activities:  . Discuss steroid therapy with pulmonologist if  you would like to consider additional therapy for COPD . Review patient assistance requirements and let pharmacist know if you would like to apply to reduce Stiolto cost  Initial goal documentation    . Pharmacy Care Plan: Hypertension     CARE PLAN ENTRY  Current Barriers:  . Controlled hypertension, complicated by hyperlipidemia . Current antihypertensive regimen:   Losartan 25 mg - 1 tablet daily  Verapamil 360 mg - 1 tablet daily  . Previous antihypertensives tried: none reported . Last practice recorded BP readings:  BP Readings from Last 3 Encounters:  10/30/19 132/60  10/28/19 (!) 144/81  10/14/19 (!) 167/89 .  Current home BP readings: 11/25/19: 132/83 mmHg; reports ranging around 140s/90 (checking infrequently)  Pharmacist Clinical Goal(s):  Marland Kitchen Over the next 30 days, patient will work with PharmD and providers on the following goals:  o Maintain blood pressure within 140/90 mmHg  Interventions: . Comprehensive medication review performed; medication list updated in the electronic medical record.   Patient Self Care Activities:  . Patient will continue to check BP monthly and when symptomatic, document, and provide at future appointments  Initial goal documentation      Hyperlipidemia   Lipid Panel     Component Value Date/Time   CHOL 240 (H) 10/22/2019 0737   TRIG 69.0 10/22/2019 0737   HDL 125.20 10/22/2019 0737   CHOLHDL 2 10/22/2019 0737   VLDL 13.8 10/22/2019 0737   LDLCALC 101 (H) 10/22/2019 0737   LDLDIRECT 99.1 09/08/2013 0801    LDL goal < 100 Patient has failed these meds in past: none reported Patient is currently controlled on the following medications:   Omega 3 fatty acids 1000 mg - 1 tablet BID  We discussed: denies CV history   Plan: Continue current medications  COPD / Tobacco   Followed by pulmonology  Last spirometry score: 01/2012 FEV1 46% Gold Grade: Gold 3 (FEV1 30-49%)  Eosinophil count:   Lab Results  Component Value  Date/Time   EOSPCT 2 09/08/2017 11:16 AM  %                               Eos (Absolute):  Lab Results  Component Value Date/Time   EOSABS 0.2 04/30/2019 04:13 PM   Tobacco Status:  Social History   Tobacco Use  Smoking Status Former Smoker  . Packs/day: 1.30  . Years: 45.00  . Pack years: 58.50  . Types: Cigarettes  . Quit date: 08/14/2003  . Years since quitting: 16.3  Smokeless Tobacco Never Used   Patient has failed these meds in past: none  Patient is currently controlled on the following medications:   Stiolto 2.5-2.5 mcg - 2 puffs daily (AM)  Albuterol nebulizer - PRN   Supplemental Oxygen - PRN  Using maintenance inhaler regularly? Yes Frequency of albuterol nebulizer use: daily   We discussed: Pt received new nebulizer and reports it is working much better and has noticed  improvement in SOB. We reviewed PAP for Stiolto but patient would prefer not to apply due to concern they they will not qualify. Offered to contact insurance to make sure Stiolto is the preferred option and patient agrees.   Initial visit items still pending: we discussed potentially adding an inhaled steroid to improve SOB (Pulmicort, QVar, Asmanex, etc.) --> pt will discuss with pulmonologist at next visit  Plan: Continue current medications; Pharmacist will review cost of inhaled steroid and Stiolto with insurance plan.   Vaccines   Reviewed and discussed patient's vaccination history.    Immunization History  Administered Date(s) Administered  . Fluad Quad(high Dose 65+) 04/09/2019  . H1N1 10/04/2008  . Influenza Split 04/21/2011, 05/12/2012  . Influenza Whole 05/27/2007, 05/08/2008, 05/04/2009, 04/26/2010  . Influenza,inj,Quad PF,6+ Mos 05/06/2013, 05/06/2014, 04/21/2015, 05/11/2016, 04/17/2017, 05/01/2018  . Pneumococcal Conjugate-13 09/21/2014  . Pneumococcal Polysaccharide-23 03/13/2006, 06/28/2011  . Td 03/13/2006  . Tdap 10/04/2016  . Zoster 09/05/2010   Plan: Recommended  patient receive Shingrix   Medication Management  Pharmacy/Benefits: [facility] and [facility]   Adherence: no concerns   Affordability: OTC credit through [place], [place] $138 90 DS, small copay with all other meds; concerned as already in donut hole this year   CCM Follow Up: 6 months   [name], PharmD Clinical Pharmacist [facility] at [facility] [phone]

## 2019-12-28 ENCOUNTER — Telehealth: Payer: Medicare HMO

## 2020-01-03 DIAGNOSIS — J9611 Chronic respiratory failure with hypoxia: Secondary | ICD-10-CM | POA: Diagnosis not present

## 2020-01-14 DIAGNOSIS — J449 Chronic obstructive pulmonary disease, unspecified: Secondary | ICD-10-CM | POA: Diagnosis not present

## 2020-01-29 ENCOUNTER — Other Ambulatory Visit: Payer: Self-pay | Admitting: Internal Medicine

## 2020-02-03 DIAGNOSIS — J9611 Chronic respiratory failure with hypoxia: Secondary | ICD-10-CM | POA: Diagnosis not present

## 2020-02-13 DIAGNOSIS — J449 Chronic obstructive pulmonary disease, unspecified: Secondary | ICD-10-CM | POA: Diagnosis not present

## 2020-02-24 DIAGNOSIS — R0602 Shortness of breath: Secondary | ICD-10-CM | POA: Diagnosis not present

## 2020-02-24 DIAGNOSIS — J432 Centrilobular emphysema: Secondary | ICD-10-CM | POA: Diagnosis not present

## 2020-02-24 DIAGNOSIS — E7800 Pure hypercholesterolemia, unspecified: Secondary | ICD-10-CM | POA: Diagnosis not present

## 2020-02-24 DIAGNOSIS — Z87898 Personal history of other specified conditions: Secondary | ICD-10-CM | POA: Diagnosis not present

## 2020-02-24 DIAGNOSIS — J441 Chronic obstructive pulmonary disease with (acute) exacerbation: Secondary | ICD-10-CM | POA: Diagnosis not present

## 2020-03-04 DIAGNOSIS — J9611 Chronic respiratory failure with hypoxia: Secondary | ICD-10-CM | POA: Diagnosis not present

## 2020-03-15 DIAGNOSIS — J449 Chronic obstructive pulmonary disease, unspecified: Secondary | ICD-10-CM | POA: Diagnosis not present

## 2020-04-04 DIAGNOSIS — J9611 Chronic respiratory failure with hypoxia: Secondary | ICD-10-CM | POA: Diagnosis not present

## 2020-04-15 ENCOUNTER — Ambulatory Visit
Admission: RE | Admit: 2020-04-15 | Discharge: 2020-04-15 | Disposition: A | Discharge status: Home / Self Care | Payer: Medicare HMO | Source: Ambulatory Visit | Attending: Urology | Admitting: Urology

## 2020-04-15 ENCOUNTER — Other Ambulatory Visit: Payer: Self-pay

## 2020-04-15 DIAGNOSIS — K8689 Other specified diseases of pancreas: Secondary | ICD-10-CM | POA: Diagnosis not present

## 2020-04-15 DIAGNOSIS — C679 Malignant neoplasm of bladder, unspecified: Secondary | ICD-10-CM | POA: Diagnosis not present

## 2020-04-15 DIAGNOSIS — Z466 Encounter for fitting and adjustment of urinary device: Secondary | ICD-10-CM | POA: Diagnosis not present

## 2020-04-15 DIAGNOSIS — I700 Atherosclerosis of aorta: Secondary | ICD-10-CM | POA: Diagnosis not present

## 2020-04-15 DIAGNOSIS — J449 Chronic obstructive pulmonary disease, unspecified: Secondary | ICD-10-CM | POA: Diagnosis not present

## 2020-04-15 DIAGNOSIS — K861 Other chronic pancreatitis: Secondary | ICD-10-CM | POA: Diagnosis not present

## 2020-04-15 DIAGNOSIS — J439 Emphysema, unspecified: Secondary | ICD-10-CM | POA: Diagnosis not present

## 2020-04-15 DIAGNOSIS — J90 Pleural effusion, not elsewhere classified: Secondary | ICD-10-CM | POA: Diagnosis not present

## 2020-04-15 LAB — POCT I-STAT CREATININE: Creatinine, Ser: 0.9 mg/dL (ref 0.44–1.00)

## 2020-04-15 MED ORDER — IOHEXOL 300 MG/ML  SOLN
100.0000 mL | Freq: Once | INTRAMUSCULAR | Status: AC | PRN
  Administered 2020-04-15: 100 mL via INTRAVENOUS

## 2020-04-20 ENCOUNTER — Encounter: Payer: Self-pay | Admitting: Urology

## 2020-04-20 ENCOUNTER — Ambulatory Visit: Payer: Medicare HMO | Admitting: Urology

## 2020-04-20 ENCOUNTER — Other Ambulatory Visit: Payer: Self-pay

## 2020-04-20 VITALS — BP 148/79 | HR 89 | Ht 61.0 in | Wt 99.0 lb

## 2020-04-20 DIAGNOSIS — C672 Malignant neoplasm of lateral wall of bladder: Secondary | ICD-10-CM

## 2020-04-20 NOTE — Progress Notes (Signed)
Cystoscopy Procedure Note:  Indication: Muscle invasive Bladder cancer  Brief summary: -Presented 04/2019 with gross hematuria, CTU showed 3cm left sided bladder mass with left hydronephrosis, sCr 1.46(eGFR33). Refused chest CT. -TURBT 05/22/2019 with resection of all visible disease(HG T2 urothelial cell carcinoma with squamous differentiation), left ureteral stent placement. Renal function improved after stent to 0.98(eGFR 54) -Radiation to bladder and pelvic nodes with Dr. Baruch Gouty completed 07/2019, she refused concurrent chemotherapy  She has opted for a every 6 month CT and cystoscopy surveillance  After informed consent and discussion of the procedure and its risks, Misty Ferguson was positioned and prepped in the standard fashion. Cystoscopy was performed with a flexible cystoscope. The urethra, bladder neck and entire bladder was visualized in a standard fashion.   The bladder was grossly normal throughout with no tumors or suspicious lesions. No abnormalities on retroflexion.  Findings: Benign cystoscopy  Imaging: I personally reviewed the CT urogram and CT chest from 04/15/2020. There is no significant hydronephrosis, no evidence of metastatic disease, no bladder masses noted, no new chest abnormalities  Assessment and Plan: Continue every 6 month CT and cystoscopy per patient preference, we again discussed the high risk of recurrence  Nickolas Madrid, MD 04/20/2020

## 2020-04-27 ENCOUNTER — Other Ambulatory Visit: Payer: Self-pay | Admitting: Internal Medicine

## 2020-04-27 ENCOUNTER — Ambulatory Visit (INDEPENDENT_AMBULATORY_CARE_PROVIDER_SITE_OTHER): Payer: Medicare HMO

## 2020-04-27 ENCOUNTER — Other Ambulatory Visit: Payer: Self-pay

## 2020-04-27 DIAGNOSIS — Z23 Encounter for immunization: Secondary | ICD-10-CM

## 2020-04-30 ENCOUNTER — Other Ambulatory Visit: Payer: Self-pay | Admitting: Dermatology

## 2020-05-05 DIAGNOSIS — J9611 Chronic respiratory failure with hypoxia: Secondary | ICD-10-CM | POA: Diagnosis not present

## 2020-05-15 DIAGNOSIS — J449 Chronic obstructive pulmonary disease, unspecified: Secondary | ICD-10-CM | POA: Diagnosis not present

## 2020-06-04 DIAGNOSIS — J9611 Chronic respiratory failure with hypoxia: Secondary | ICD-10-CM | POA: Diagnosis not present

## 2020-06-15 DIAGNOSIS — J449 Chronic obstructive pulmonary disease, unspecified: Secondary | ICD-10-CM | POA: Diagnosis not present

## 2020-07-04 ENCOUNTER — Ambulatory Visit: Payer: Medicare HMO

## 2020-07-04 ENCOUNTER — Other Ambulatory Visit: Payer: Self-pay

## 2020-07-04 DIAGNOSIS — J9611 Chronic respiratory failure with hypoxia: Secondary | ICD-10-CM

## 2020-07-04 DIAGNOSIS — I10 Essential (primary) hypertension: Secondary | ICD-10-CM

## 2020-07-04 NOTE — Chronic Care Management (AMB) (Signed)
Chronic Care Management Pharmacy  Name: Misty Ferguson  MRN: [id number] DOB: 05/23/1934  Chief Complaint/ HPI  Misty Ferguson,  86 y.o., female presents for their Follow-Up CCM visit with the clinical pharmacist via telephone.  PCP : Misty Sanders, MD  Their chronic conditions include: hypertension, hyperlipidemia, COPD  Patient concerns: neuropathy still bothersome, discussed alpha-lipoic acid; SOB has worsening since October but unable to get into pulmonology until December    Office Visits:  11/20/19: Misty Ferguson (telephone) - consider diclofenac gel for nerve pain, pt denies interest in rx medications at this time   10/30/19: Misty Ferguson - severe COPD, followed by Misty Ferguson, wheelchair, chronic oxygen, assisted by caregiver, denies bone density scans and colon screening, bladder cancer, no med changes  Consult Visit: 02/24/20: Cardiology - tachycardia- Will continue with verapamil at 360 mg daily. Will continue with her losartan 25 mg daily due to relative hypotension and follow-up. SOB secondary emphysema, rtc 6 months   08/25/19: Cardiology - note not available  No Known Allergies  Medications: Outpatient Encounter Medications as of 07/04/2020  Medication Sig   ascorbic acid (VITAMIN C) 1000 MG tablet Take 1,000 mg by mouth daily.    Cholecalciferol (VITAMIN D) 125 MCG (5000 UT) CAPS Take 5,000 Units by mouth daily.   docusate sodium (COLACE) 100 MG capsule Take 100 mg by mouth 2 (two) times daily.   losartan (COZAAR) 25 MG tablet 25 mg daily.    Multiple Vitamins-Minerals (PRESERVISION AREDS) TABS Take 1 tablet by mouth 2 (two) times daily.   mupirocin ointment (BACTROBAN) 2 % APPLY TO AFFECTED AREA EVERY DAY   Omega-3 Fatty Acids (FISH OIL) 1000 MG CAPS Take 1,000 mg by mouth 2 (two) times daily.    OXYGEN Inhale 4 L into the lungs as needed.   silver sulfADIAZINE (SILVADENE) 1 % cream Apply 1 application topically daily.   STIOLTO RESPIMAT 2.5-2.5 MCG/ACT AERS INHALE  2 PUFFS INTO THE LUNGS DAILY. (APPOINTMENT IS NEEDED)   verapamil (VERELAN PM) 360 MG 24 hr capsule Take 360 mg by mouth daily.   Vitamin E 180 MG CAPS Take 180 mg by mouth daily.   albuterol (PROVENTIL) (2.5 MG/3ML) 0.083% nebulizer solution Take 3 mLs (2.5 mg total) by nebulization every 6 (six) hours as needed for wheezing or shortness of breath. (Patient not taking: Reported on 07/04/2020)   [DISCONTINUED] albuterol (PROVENTIL HFA;VENTOLIN HFA) 108 (90 Base) MCG/ACT inhaler Inhale 2 puffs into the lungs every 6 (six) hours as needed for wheezing or shortness of breath. (Patient not taking: Reported on 07/04/2020)   [DISCONTINUED] OVER THE COUNTER MEDICATION Take 1 tablet by mouth daily. Super Cleanser (Patient not taking: Reported on 07/04/2020)   No facility-administered encounter medications on file as of 07/04/2020.   Current Diagnosis/Assessment:   Emergency planning/management officer Strain: Low Risk    Difficulty of Paying Living Expenses: Not very hard   Goals     Increase physical activity     Starting 10/04/2016, I will continue to exercise at least 40 min daily.      Increase physical activity     Starting 10/11/2017, I will continue to walk on treadmill for 15-20 minutes 5 days per week.      Patient Stated     10/28/2019, I will continue to ride my treadmill 5 days a week for 12 minutes.     Pharmacy Care Plan     CARE PLAN ENTRY  Current Barriers:   Chronic Disease Management support, education, and care coordination  needs related to Hypertension and COPD   Hypertension BP Readings from Last 3 Encounters:  04/20/20 (!) 148/79  10/30/19 132/60  10/28/19 (!) 144/81    Pharmacist Clinical Goal(s): o Over the next 6 months, patient will work with PharmD and providers to achieve BP goal <140/90 mmHg  Current regimen:   Losartan 25 mg - 1 tablet daily  Verapamil 360 mg - 1 tablet daily  Interventions: o Reviewed recent BP readings  o Reviewed labs: kidney function,  potassium levels  o Recommend continuing current therapy  Patient self care activities - Over the next 6 months, patient will: o Check BP once monthly and 5 days prior to any office visits, document, and provide at future appointments o Ensure daily salt intake < 2300 mg/day  COPD  Pharmacist Clinical Goal(s) o Over the next 6 months, patient will work with PharmD and providers to improve shortness of breath  Current regimen:  o Stiolto 2.5-2.5 mcg - Inhale 2 puffs daily   Interventions:  Contact insurance to review cost of Stiolto alternatives - Per insurance, Stiolto is the preferred and lowest cost option in the drug class, Anoro is not covered.  Discussed patient assistance options - patient declined application [id number]  Patient self care activities - Over the next 6 months, patient will:  Discuss inhaled steroid therapy with pulmonologist   Please see past updates related to this goal by clicking on the "Past Updates" button in the selected goal          Hypertension   CMP Latest Ref Rng & Units 04/15/2020 10/30/2019 10/22/2019  Glucose 70 - 99 mg/dL - 106(H) 108(H)  BUN 6 - 23 mg/dL - 14 16  Creatinine 0.44 - 1.00 mg/dL 0.90 0.81 0.92  Sodium 135 - 145 mEq/L - 140 140  Potassium 3.5 - 5.1 mEq/L - 4.9 5.2(H)  Chloride 96 - 112 mEq/L - 102 103  CO2 19 - 32 mEq/L - 31 30  Calcium 8.4 - 10.5 mg/dL - 9.9 9.4  Total Protein 6.0 - 8.3 g/dL - - 6.3  Total Bilirubin 0.2 - 1.2 mg/dL - - 0.5  Alkaline Phos 39 - 117 U/L - - 82  AST 0 - 37 U/L - - 26  ALT 0 - 35 U/L - - 24   Office blood pressures are: BP Readings from Last 3 Encounters:  04/20/20 (!) 148/79  10/30/19 132/60  10/28/19 (!) 144/81   BP goal < 140/90 mmHg Patient has failed these meds in the past: none  Patient checks BP at home infrequently (has arm cuff - home monitor) Patient home BP readings are ranging: 140/90 mmHg - reports minimal change since decreasing losartan, reports BP is within goal as long  as she is seated and resting   Patient is currently controlled on the following medications:   Losartan 25 mg - 1 tablet daily  Verapamil 360 mg - 1 tablet daily (cardiology)  We discussed: recently decreased losartan 50 mg to 25 mg due to hyperkalemia, potassium WNL  Update 07/04/20: no changes to medications, no BP reported  Plan: Continue current medications; Check home BP monthly   Hyperlipidemia   Lipid Panel     Component Value Date/Time   CHOL 240 (H) 10/22/2019 0737   TRIG 69.0 10/22/2019 0737   HDL 125.20 10/22/2019 0737   CHOLHDL 2 10/22/2019 0737   VLDL 13.8 10/22/2019 0737   LDLCALC 101 (H) 10/22/2019 0737   LDLDIRECT 99.1 09/08/2013 0801    LDL goal <  100 Patient has failed these meds in past: none reported Patient is currently controlled on the following medications:   Omega 3 fatty acids 1000 mg - 1 tablet BID  We discussed: denies CV history   Update 07/04/20: continues fish oil BID  Plan: Continue current medications  COPD / Tobacco   Followed by pulmonology  Last spirometry score: 01/2012 FEV1 46% Gold Grade: Gold 3 (FEV1 30-49%)  Eosinophil count:   Lab Results  Component Value Date/Time   EOSPCT 2 09/08/2017 11:16 AM  %                               Eos (Absolute):  Lab Results  Component Value Date/Time   EOSABS 0.2 04/30/2019 04:13 PM   Tobacco Status:  Social History   Tobacco Use  Smoking Status Former Smoker   Packs/day: 1.30   Years: 45.00   Pack years: 58.50   Types: Cigarettes   Quit date: 08/14/2003   Years since quitting: 16.9  Smokeless Tobacco Never Used   Patient has failed these meds in past: none  Patient is currently uncontrolled on the following medications:   Stiolto 2.5-2.5 mcg - 2 puffs daily (AM)  Supplemental Oxygen - PRN  Using maintenance inhaler regularly? Yes Frequency of rescue inhaler use: none  We discussed: changed from Advair and Spiriva to [place] due to cost about 3 years ago, we  discussed potentially adding an inhaled steroid to improve SOB (Pulmicort, QVar, Asmanex, etc.) --> pt will discuss with pulmonologist at next visit  Update 07/04/20: Pt has scheduled pulmonology visit for 07/15/20. She is having worsened SOB. Continues Stiolto but does not use albuterol (by nebulizer or inhaler). Denies efficacy with albuterol.   Plan: Continue current medications; Recommend discussing inhaled steroid with pulmonology.   Vaccines   Reviewed and discussed patient's vaccination history.   Pt declines COVID-19 vaccine due to concern for lack of evidence.  Immunization History  Administered Date(s) Administered   Fluad Quad(high Dose 65+) 04/09/2019, 04/27/2020   H1N1 10/04/2008   Influenza Split 04/21/2011, 05/12/2012   Influenza Whole 05/27/2007, 05/08/2008, 05/04/2009, 04/26/2010   Influenza,inj,Quad PF,6+ Mos 05/06/2013, 05/06/2014, 04/21/2015, 05/11/2016, 04/17/2017, 05/01/2018   Pneumococcal Conjugate-13 09/21/2014   Pneumococcal Polysaccharide-23 03/13/2006, 06/28/2011   Td 03/13/2006   Tdap 10/04/2016   Zoster 09/05/2010   Plan: Recommended patient receive Shingrix   Medication Management  Misc: Silvadene 1% daily PRN, Mupirocin ointment daily PRN  OTCs: vitamin E 180 mg - daily, vitamin C 1000 mg - daily, vitamin D 5000 IU (125 mcg) - daily, docusate 100 mg - BID, natural fiber, super cleanser - 1 daily, Preservision - BID, Dry Eye drops daily, Quercetine for CV health   Pharmacy/Benefits: [facility] and [facility]   Adherence: no concerns, refills timely, no medications with > 5 day gap   Social support: able to afford basic necessities, husband drives for her and gets groceries, Social research officer, government.  Affordability: OTC credit through [place] is high but ineligible for PAP, LIS, or coupons  CCM Follow Up: 6 months, telephone  [name], PharmD Clinical Pharmacist [facility] at [facility] [phone]

## 2020-07-04 NOTE — Patient Instructions (Addendum)
Dear Misty Ferguson,  Below is a summary of the goals we discussed during our follow up appointment on July 04, 2020. Please contact me anytime with questions or concerns.   Visit Information  Goals Addressed            This Visit's Progress   . Pharmacy Care Plan       CARE PLAN ENTRY  Current Barriers:  . Chronic Disease Management support, education, and care coordination needs related to Hypertension and COPD   Hypertension BP Readings from Last 3 Encounters:  04/20/20 (!) 148/79  10/30/19 132/60  10/28/19 (!) 144/81   . Pharmacist Clinical Goal(s): o Over the next 6 months, patient will work with PharmD and providers to achieve BP goal <140/90 mmHg . Current regimen:   Losartan 25 mg - 1 tablet daily  Verapamil 360 mg - 1 tablet daily . Interventions: o Reviewed recent BP readings  o Reviewed labs: kidney function, potassium levels  o Recommend continuing current therapy . Patient self care activities - Over the next 6 months, patient will: o Check BP once monthly and 5 days prior to any office visits, document, and provide at future appointments o Ensure daily salt intake < 2300 mg/day  COPD . Pharmacist Clinical Goal(s) o Over the next 6 months, patient will work with PharmD and providers to improve shortness of breath . Current regimen:  o Stiolto 2.5-2.5 mcg - Inhale 2 puffs daily  . Interventions: . Contact insurance to review cost of Stiolto alternatives - Per insurance, Stiolto is the preferred and lowest cost option in the drug class, Anoro is not covered. . Discussed patient assistance options - patient declined application 04/8118 . Patient self care activities - Over the next 6 months, patient will: Marland Kitchen Discuss inhaled steroid therapy with pulmonologist   Please see past updates related to this goal by clicking on the "Past Updates" button in the selected goal         The patient verbalized understanding of instructions, educational materials,  and care plan provided today and agreed to receive a mailed copy of patient instructions, educational materials, and care plan.   Telephone follow up appointment with pharmacy team member scheduled for: Monday, Jan 03, 2020 at 9:15 AM (telephone visit)  [name], PharmD Clinical Pharmacist [facility] at [facility] [phone]   Low-Sodium Eating Plan Sodium, which is an element that makes up salt, helps you maintain a healthy balance of fluids in your body. Too much sodium can increase your blood pressure and cause fluid and waste to be held in your body. Your health care provider or dietitian may recommend following this plan if you have high blood pressure (hypertension), kidney disease, liver disease, or heart failure. Eating less sodium can help lower your blood pressure, reduce swelling, and protect your heart, liver, and kidneys. What are tips for following this plan? General guidelines  Most people on this plan should limit their sodium intake to 1,500-2,000 mg (milligrams) of sodium each day. Reading food labels   The Nutrition Facts label lists the amount of sodium in one serving of the food. If you eat more than one serving, you must multiply the listed amount of sodium by the number of servings.  Choose foods with less than 140 mg of sodium per serving.  Avoid foods with 300 mg of sodium or more per serving. Shopping  Look for lower-sodium products, often labeled as "low-sodium" or "no salt added."  Always check the sodium  content even if foods are labeled as "unsalted" or "no salt added".  Buy fresh foods. ? Avoid canned foods and premade or frozen meals. ? Avoid canned, cured, or processed meats  Buy breads that have less than 80 mg of sodium per slice. Cooking  Eat more home-cooked food and less restaurant, buffet, and fast food.  Avoid adding salt when cooking. Use salt-free seasonings or herbs instead of table salt or sea salt. Check with  your health care provider or pharmacist before using salt substitutes.  Cook with plant-based oils, such as canola, sunflower, or olive oil. Meal planning  When eating at a restaurant, ask that your food be prepared with less salt or no salt, if possible.  Avoid foods that contain MSG (monosodium glutamate). MSG is sometimes added to [place] food, bouillon, and some canned foods. What foods are recommended? The items listed may not be a complete list. Talk with your dietitian about what dietary choices are best for you. Grains Low-sodium cereals, including oats, puffed wheat and rice, and shredded wheat. Low-sodium crackers. Unsalted rice. Unsalted pasta. Low-sodium bread. Whole-grain breads and whole-grain pasta. Vegetables Fresh or frozen vegetables. "No salt added" canned vegetables. "No salt added" tomato sauce and paste. Low-sodium or reduced-sodium tomato and vegetable juice. Fruits Fresh, frozen, or canned fruit. Fruit juice. Meats and other protein foods Fresh or frozen (no salt added) meat, poultry, seafood, and fish. Low-sodium canned tuna and salmon. Unsalted nuts. Dried peas, beans, and lentils without added salt. Unsalted canned beans. Eggs. Unsalted nut butters. Dairy Milk. Soy milk. Cheese that is naturally low in sodium, such as ricotta cheese, fresh mozzarella, or Swiss cheese Low-sodium or reduced-sodium cheese. Cream cheese. Yogurt. Fats and oils Unsalted butter. Unsalted margarine with no trans fat. Vegetable oils such as canola or olive oils. Seasonings and other foods Fresh and dried herbs and spices. Salt-free seasonings. Low-sodium mustard and ketchup. Sodium-free salad dressing. Sodium-free light mayonnaise. Fresh or refrigerated horseradish. Lemon juice. Vinegar. Homemade, reduced-sodium, or low-sodium soups. Unsalted popcorn and pretzels. Low-salt or salt-free chips. What foods are not recommended? The items listed may not be a complete list. Talk with your  dietitian about what dietary choices are best for you. Grains Instant hot cereals. Bread stuffing, pancake, and biscuit mixes. Croutons. Seasoned rice or pasta mixes. Noodle soup cups. Boxed or frozen macaroni and cheese. Regular salted crackers. Self-rising flour. Vegetables Sauerkraut, pickled vegetables, and relishes. Olives. [place] fries. Onion rings. Regular canned vegetables (not low-sodium or reduced-sodium). Regular canned tomato sauce and paste (not low-sodium or reduced-sodium). Regular tomato and vegetable juice (not low-sodium or reduced-sodium). Frozen vegetables in sauces. Meats and other protein foods Meat or fish that is salted, canned, smoked, spiced, or pickled. Bacon, ham, sausage, hotdogs, corned beef, chipped beef, packaged lunch meats, salt pork, jerky, pickled herring, anchovies, regular canned tuna, sardines, salted nuts. Dairy Processed cheese and cheese spreads. Cheese curds. Blue cheese. Feta cheese. String cheese. Regular cottage cheese. Buttermilk. Canned milk. Fats and oils Salted butter. Regular margarine. Ghee. Bacon fat. Seasonings and other foods Onion salt, garlic salt, seasoned salt, table salt, and sea salt. Canned and packaged gravies. Worcestershire sauce. Tartar sauce. Barbecue sauce. Teriyaki sauce. Soy sauce, including reduced-sodium. Steak sauce. Fish sauce. Oyster sauce. Cocktail sauce. Horseradish that you find on the shelf. Regular ketchup and mustard. Meat flavorings and tenderizers. Bouillon cubes. Hot sauce and Tabasco sauce. Premade or packaged marinades. Premade or packaged taco seasonings. Relishes. Regular salad dressings. Salsa. Potato and tortilla chips. Corn chips and  puffs. Salted popcorn and pretzels. Canned or dried soups. Pizza. Frozen entrees and pot pies. Summary  Eating less sodium can help lower your blood pressure, reduce swelling, and protect your heart, liver, and kidneys.  Most people on this plan should limit their sodium intake to  1,500-2,000 mg (milligrams) of sodium each day.  Canned, boxed, and frozen foods are high in sodium. Restaurant foods, fast foods, and pizza are also very high in sodium. You also get sodium by adding salt to food.  Try to cook at home, eat more fresh fruits and vegetables, and eat less fast food, canned, processed, or prepared foods. This information is not intended to replace advice given to you by your health care provider. Make sure you discuss any questions you have with your health care provider. Document Revised: 07/12/2017 Document Reviewed: 07/23/2016 Elsevier Patient Education  2020 [employer].

## 2020-07-05 DIAGNOSIS — J9611 Chronic respiratory failure with hypoxia: Secondary | ICD-10-CM | POA: Diagnosis not present

## 2020-07-15 ENCOUNTER — Encounter: Payer: Self-pay | Admitting: Internal Medicine

## 2020-07-15 ENCOUNTER — Ambulatory Visit: Payer: Medicare HMO | Admitting: Internal Medicine

## 2020-07-15 ENCOUNTER — Telehealth: Payer: Self-pay | Admitting: Internal Medicine

## 2020-07-15 ENCOUNTER — Other Ambulatory Visit: Payer: Self-pay

## 2020-07-15 VITALS — BP 138/78 | HR 77 | Temp 97.8°F | Ht 61.0 in | Wt 99.0 lb

## 2020-07-15 DIAGNOSIS — J449 Chronic obstructive pulmonary disease, unspecified: Secondary | ICD-10-CM

## 2020-07-15 DIAGNOSIS — J9611 Chronic respiratory failure with hypoxia: Secondary | ICD-10-CM | POA: Diagnosis not present

## 2020-07-15 MED ORDER — PREDNISONE 20 MG PO TABS: 20.0000 mg | ORAL_TABLET | Freq: Every day | ORAL | 1 refills | Status: DC

## 2020-07-15 MED ORDER — AZITHROMYCIN 250 MG PO TABS: ORAL_TABLET | ORAL | 0 refills | Status: DC

## 2020-07-15 MED ORDER — BUDESONIDE 0.5 MG/2ML IN SUSP: 0.5000 mg | Freq: Two times a day (BID) | RESPIRATORY_TRACT | 10 refills | Status: DC

## 2020-07-15 MED ORDER — ARFORMOTEROL TARTRATE 15 MCG/2ML IN NEBU: 15.0000 ug | INHALATION_SOLUTION | Freq: Two times a day (BID) | RESPIRATORY_TRACT | 3 refills | Status: DC

## 2020-07-15 MED ORDER — ARFORMOTEROL TARTRATE 15 MCG/2ML IN NEBU: 15.0000 ug | INHALATION_SOLUTION | Freq: Two times a day (BID) | RESPIRATORY_TRACT | 10 refills | Status: DC

## 2020-07-15 MED ORDER — BUDESONIDE 0.5 MG/2ML IN SUSP: 0.5000 mg | Freq: Two times a day (BID) | RESPIRATORY_TRACT | 3 refills | Status: DC

## 2020-07-15 NOTE — Progress Notes (Signed)
[facility] Consultation     Date: 07/15/2020,   MRN# [id number] Misty Ferguson [birth year]    Admission                  Current   Synopsis: Misty Ferguson was first seen by the [facility] pulmonary clinic in the summer of 2013. She has COPD. Simple spirometry performed in clinic showed an FEV1 of 45% predicted with clear obstruction.  She smoked 2 pack/day x 40 years, quit  17 years ago according to patient. She is a frequent exacerbation phenotype. She uses 2 L of oxygen continuously with exertion and at night   lling, mild erythema CC  Follow-up COPD Follow-up hypoxic respiratory failure    HPI Chronic shortness of breath chronic dyspnea on exertion seems to be worsening over the last several months Patient uses and benefits from oxygen therapy 24/7   +exacerbation at this time No evidence of heart failure at this time +signs of infection at this time No respiratory distress No fevers, chills, nausea, vomiting, diarrhea No evidence of lower extremity edema No evidence hemoptysis  Patient diagnosed with bladder cancer sees urology o signs of CHF or lower ext swelling at t  ssues, has chCurrent Medication:   Current Outpatient Medications:  .  albuterol (PROVENTIL) (2.5 MG/3ML) 0.083% nebulizer solution, Take 3 mLs (2.5 mg total) by nebulization every 6 (six) hours as needed for wheezing or shortness of breath., Disp: 360 mL, Rfl: 3 .  ascorbic acid (VITAMIN C) 1000 MG tablet, Take 1,000 mg by mouth daily. , Disp: , Rfl:  .  Cholecalciferol (VITAMIN D) 125 MCG (5000 UT) CAPS, Take 5,000 Units by mouth daily., Disp: , Rfl:  .  docusate sodium (COLACE) 100 MG capsule, Take 100 mg by mouth 2 (two) times daily., Disp: , Rfl:  .  losartan (COZAAR) 25 MG tablet, 25 mg daily. , Disp: , Rfl:  .  Multiple Vitamins-Minerals (PRESERVISION AREDS) TABS, Take 1 tablet by mouth 2 (two) times daily., Disp: , Rfl:  .  mupirocin ointment (BACTROBAN) 2 %, APPLY  TO AFFECTED AREA EVERY DAY, Disp: 22 g, Rfl: 11 .  Omega-3 Fatty Acids (FISH OIL) 1000 MG CAPS, Take 1,000 mg by mouth 2 (two) times daily. , Disp: , Rfl:  .  OXYGEN, Inhale 4 L into the lungs as needed., Disp: , Rfl:  .  silver sulfADIAZINE (SILVADENE) 1 % cream, Apply 1 application topically daily., Disp: 50 g, Rfl: 0 .  STIOLTO RESPIMAT 2.5-2.5 MCG/ACT AERS, INHALE 2 PUFFS INTO THE LUNGS DAILY. (APPOINTMENT IS NEEDED), Disp: 12 g, Rfl: 0 .  verapamil (VERELAN PM) 360 MG 24 hr capsule, Take 360 mg by mouth daily., Disp: , Rfl:  .  Vitamin E 180 MG CAPS, Take 180 mg by mouth daily., Disp: , Rfl:      ALLERGIES   Patient has no known allergies.     Review of Systems:  Gen:  Denies  fever, sweats, chills weight loss  HEENT: Denies blurred vision, double vision, ear pain, eye pain, hearing loss, nose bleeds, sore throat Cardiac:  No dizziness, chest pain or heaviness, chest tightness,edema, No JVD Resp:   No cough, -sputum production, +shortness of breath,+wheezing, -hemoptysis,  Gi: Denies swallowing difficulty, stomach pain, nausea or vomiting, diarrhea, constipation, bowel incontinence Gu:  Denies bladder incontinence, burning urine Ext:   Denies Joint pain, stiffness or swelling Skin: Denies  skin rash, easy bruising or bleeding or hives Endoc:  Denies polyuria, polydipsia ,  polyphagia or weight change Psych:   Denies depression, insomnia or hallucinations  Other:  All other systems negative    BP 138/78 (BP Location: Left Arm, Cuff Size: Normal)   Pulse 77   Temp 97.8 F (36.6 C) (Temporal)   Ht 5\' 1"  (1.549 m)   Wt 99 lb (44.9 kg)   SpO2 93%   BMI 18.71 kg/m     Physical Examination:   General Appearance: No distress  Neuro:without focal findings,  speech normal,  HEENT: PERRLA, EOM intact.   Pulmonary: normal breath sounds,+wheezing.  CardiovascularNormal S1,S2.  No m/r/g.   Abdomen: Benign, Soft, non-tender. Renal:  No costovertebral tenderness  GU:  Not  performed at this time. Endoc: No evident thyromegaly Skin:   warm, no rashes, no ecchymosis  Extremities: normal, no cyanosis, clubbing. PSYCHIATRIC: Mood, affect within normal limits.   ALL OTHER ROS ARE NEGATIVE      ASSESSMENT/PLAN   86 year old white female seen today for follow-up assessment for Gold stage D severe COPD with chronic hypoxic respiratory failure with CT findings suggestive of severe emphysema and bronchiectasis with a right lower lobe pleural-based nodule  at this time patient with progressive shortness of breath and dyspnea exertion and is unable to tolerate traditional inhaler therapy therefore she needs nebulized therapy due to her very poor respiratory insufficiency  patient also has evidence of COPD exacerbation at this time recommend Z-Pak recommend prednisone 20 mg daily for 10 days   Shortness of breath or dyspnea exertion seems to be stable at this time however increased heart rate affects her breathing significantly Her shortness of breath and dyspnea exertion is related to her COPD and hypoxia we will stop Stiolto and change to Pulmicort and Brovana nebulized therapy as maintenance therapy  Severe COPD based on pulmonary function testing Use albuterol nebs as needed Advised to use nebs prior to physical exertion  Chronic hypoxic respiratory failure from COPD Patient uses oxygen therapy 24/7 Continue oxygen as prescribed Patient uses and benefits from oxygen therapy She needs this for survival  Right pleural-based nodule based on CT scan findings Patient does not want repeat CT chest to assess interval changes She does not want to pursue any further diagnostic testing She is happy with her life and does not want any type of work-up for this nodule    COVID-19 EDUCATION: The signs and symptoms of COVID-19 were discussed with the patient and how to seek care for testing.  The importance of social distancing was discussed today. Hand Washing  Techniques and avoid touching face was advised.     MEDICATION ADJUSTMENTS/LABS AND TESTS ORDERED: Start Pulmicort nebulizers twice daily  start Brovana nebulizers twice daily  Stop Stiolto prednisone 20 mg daily for 10 days start Z-Pak  Continue oxygen therapy   CURRENT MEDICATIONS REVIEWED AT LENGTH WITH PATIENT TODAY   Patient satisfied with Plan of action and management. All questions answered  Follow up in 6 months  Total Time Spent 36 mins   Maretta Bees Patricia Pesa, M.D.  [facility]  Medical Director [place] Director [facility] Cardio-Pulmonary Department

## 2020-07-15 NOTE — Telephone Encounter (Signed)
Spoke to patient's spouse, Eric(DPR). Randall Hiss stated that Garlon Hatchet is not covered by insurance and pulmicort was not received by pharmacy. Per our records both Brovana and Pulmicort was sent to [facility] today.  according to epic, both medications are covered under patient's insurance. I have made Randall Hiss aware of this and suggested sending Rx to [place], as they can file these medications under insurance different then local pharmacy.  He would like Rx sent to [facility] mail order. Rx for Brovana and pulmicort has been sent as requested.  Nothing further needed.

## 2020-07-15 NOTE — Patient Instructions (Signed)
Start Pulmicort nebulizers twice daily  start Brovana nebulizers twice daily  Stop Stiolto prednisone 20 mg daily for 10 days start Z-Pak  Continue oxygen therapy

## 2020-07-18 ENCOUNTER — Telehealth: Payer: Self-pay | Admitting: Internal Medicine

## 2020-07-18 NOTE — Telephone Encounter (Signed)
Attempted to start PA via CMM. Received message stating that PA has already been started and can not be process via CMM.  Contacted [employer] and spoke to [place], who states that she would fax paper copy for PA. Will await request.

## 2020-07-18 NOTE — Telephone Encounter (Signed)
PA form has been completed and placed in Dr. Zoila Shutter folder for signature.   Routing to Dr. Mortimer Fries to make aware.

## 2020-07-18 NOTE — Telephone Encounter (Signed)
Spoke to [employer]).  Misty Ferguson stated that Misty Ferguson is requiring a PA. He is aware that we have received PA request and that will be completed today.

## 2020-07-18 NOTE — Telephone Encounter (Signed)
Lm for patient's spouse, Eric(DPR).

## 2020-07-19 NOTE — Telephone Encounter (Signed)
Completed PA form has been faxed to [facility] at [phone].  Will await determination.

## 2020-07-19 NOTE — Telephone Encounter (Signed)
Pt returning missed call. States he is having phone trouble. [phone]

## 2020-07-19 NOTE — Telephone Encounter (Signed)
Randall Hiss husband returning phone call. Insurance covers Orthoptist. Etic phone number is [phone].

## 2020-07-19 NOTE — Telephone Encounter (Signed)
Spoke to patient's spouse, Eric(DPR) and recommended that she contact insurance for a list of alternatives to [place].  Patient will call back with list.

## 2020-07-19 NOTE — Telephone Encounter (Signed)
Pt husband states they are still working w/ Mcarthur Rossetti but wants to know if there are any alternatives b/c the rx will cost $1200. Please advise. [phone]

## 2020-07-19 NOTE — Telephone Encounter (Signed)
Received denial for Brovana.   Patient's spouse, Eric(DPR) is requesting an alternative.   Dr. Mortimer Fries, please advise. Thanks

## 2020-07-19 NOTE — Telephone Encounter (Signed)
Spoke to patient's spouse, Eric(DPR). Randall Hiss stated that co pay for Garlon Hatchet is $1,200 Randall Hiss is aware that we started PA today for [place]. Will await determination.

## 2020-07-19 NOTE — Telephone Encounter (Signed)
Lm for patient's spouse, Randall Hiss.

## 2020-07-19 NOTE — Telephone Encounter (Signed)
Spoke to patient's spouse, Eric(DPR).   Randall Hiss stated that cover alternative to brovana is perforomist. He also stated that patient would like to continue on Stiolto until the first of the year because she is in the donut hole currently. Randall Hiss is questioning if Pulmicort has be taken alone or does if have to be taken with another medication.  Dr. Delanna Ahmadi advise. thanks

## 2020-07-19 NOTE — Telephone Encounter (Signed)
What are her options from her  Insurance POV

## 2020-07-20 MED ORDER — FORMOTEROL FUMARATE 20 MCG/2ML IN NEBU: 20.0000 ug | INHALATION_SOLUTION | Freq: Two times a day (BID) | RESPIRATORY_TRACT | 3 refills | Status: DC

## 2020-07-20 NOTE — Telephone Encounter (Signed)
Continue PULMICORT CONTINUE STIOLTO UNTIL PERFOROMIST IS ADDED TO REGIMEN, THEN STOP STIOLTO

## 2020-07-20 NOTE — Telephone Encounter (Signed)
Patient's spouse, Eric(DPR) is aware of below recommended. He voiced understanding.  Rx for preforomist has been sent to preferred pharmacy.  Nothing further needed.

## 2020-07-22 NOTE — Telephone Encounter (Signed)
PA for perforomist has been competed an approved.  [facility] pharmacy is aware of approval.

## 2020-07-26 ENCOUNTER — Telehealth: Payer: Self-pay | Admitting: Internal Medicine

## 2020-07-26 NOTE — Telephone Encounter (Signed)
Spoke to patient's spouse, Randall Hiss (DPR). Randall Hiss stated that he spoke with patient's insurance and was told that there was not cheaper alternative to perforomist. Randall Hiss has requested a shipping label from [place] to send pulmicort back. Patient would like to continue with stiolto only. Nothing further needed.  Routing to Dr. Mortimer Fries as an Juluis Rainier.

## 2020-07-26 NOTE — Telephone Encounter (Signed)
Perferct

## 2020-07-26 NOTE — Telephone Encounter (Signed)
Spoke to patient;s spouse, Eric(DPR). Randall Hiss stated Perforomist is 500 dollars. He was like cheaper alternative. Advised patient to contact insurance for cheaper alternative.  Patient will call back with update.  He would like to know if pulmicort and stiolto can be taking together.  Dr. Mortimer Fries, please advise.

## 2020-08-04 DIAGNOSIS — J9611 Chronic respiratory failure with hypoxia: Secondary | ICD-10-CM | POA: Diagnosis not present

## 2020-08-15 ENCOUNTER — Other Ambulatory Visit: Payer: Self-pay | Admitting: Internal Medicine

## 2020-08-15 DIAGNOSIS — J449 Chronic obstructive pulmonary disease, unspecified: Secondary | ICD-10-CM | POA: Diagnosis not present

## 2020-08-17 ENCOUNTER — Telehealth: Payer: Self-pay | Admitting: Internal Medicine

## 2020-08-17 MED ORDER — STIOLTO RESPIMAT 2.5-2.5 MCG/ACT IN AERS: 2.0000 | INHALATION_SPRAY | Freq: Every day | RESPIRATORY_TRACT | 3 refills | Status: DC

## 2020-08-17 NOTE — Telephone Encounter (Signed)
Please refer to 07/26/2020 phone note.  Spoke to patient, who is requesting a refill on stiolto. Per patient, this medication was denied by our office. I do not see record of this.  Rx has been sent to preferred pharmacy.  Nothing further needed.

## 2020-09-04 DIAGNOSIS — J9611 Chronic respiratory failure with hypoxia: Secondary | ICD-10-CM | POA: Diagnosis not present

## 2020-09-15 DIAGNOSIS — R0602 Shortness of breath: Secondary | ICD-10-CM | POA: Diagnosis not present

## 2020-09-15 DIAGNOSIS — E7800 Pure hypercholesterolemia, unspecified: Secondary | ICD-10-CM | POA: Diagnosis not present

## 2020-09-15 DIAGNOSIS — J9611 Chronic respiratory failure with hypoxia: Secondary | ICD-10-CM | POA: Diagnosis not present

## 2020-09-15 DIAGNOSIS — J449 Chronic obstructive pulmonary disease, unspecified: Secondary | ICD-10-CM | POA: Diagnosis not present

## 2020-09-15 DIAGNOSIS — I10 Essential (primary) hypertension: Secondary | ICD-10-CM | POA: Diagnosis not present

## 2020-09-15 DIAGNOSIS — J432 Centrilobular emphysema: Secondary | ICD-10-CM | POA: Diagnosis not present

## 2020-10-05 DIAGNOSIS — J9611 Chronic respiratory failure with hypoxia: Secondary | ICD-10-CM | POA: Diagnosis not present

## 2020-10-13 DIAGNOSIS — J449 Chronic obstructive pulmonary disease, unspecified: Secondary | ICD-10-CM | POA: Diagnosis not present

## 2020-10-14 ENCOUNTER — Other Ambulatory Visit: Payer: Self-pay

## 2020-10-14 ENCOUNTER — Ambulatory Visit
Admission: RE | Admit: 2020-10-14 | Discharge: 2020-10-14 | Disposition: A | Discharge status: Home / Self Care | Payer: Medicare HMO | Source: Ambulatory Visit | Attending: Urology | Admitting: Urology

## 2020-10-14 DIAGNOSIS — R911 Solitary pulmonary nodule: Secondary | ICD-10-CM | POA: Diagnosis not present

## 2020-10-14 DIAGNOSIS — C672 Malignant neoplasm of lateral wall of bladder: Secondary | ICD-10-CM | POA: Insufficient documentation

## 2020-10-14 DIAGNOSIS — J439 Emphysema, unspecified: Secondary | ICD-10-CM | POA: Diagnosis not present

## 2020-10-14 DIAGNOSIS — C679 Malignant neoplasm of bladder, unspecified: Secondary | ICD-10-CM | POA: Diagnosis not present

## 2020-10-14 LAB — POCT I-STAT CREATININE: Creatinine, Ser: 0.9 mg/dL (ref 0.44–1.00)

## 2020-10-14 MED ORDER — IOHEXOL 300 MG/ML  SOLN
80.0000 mL | Freq: Once | INTRAMUSCULAR | Status: AC | PRN
  Administered 2020-10-14: 80 mL via INTRAVENOUS

## 2020-10-17 ENCOUNTER — Telehealth: Payer: Self-pay

## 2020-10-17 NOTE — Telephone Encounter (Signed)
Called pt no answer. Left detailed message for pt per DPR. Advised pt to call back for questions or concerns.  

## 2020-10-17 NOTE — Telephone Encounter (Signed)
-----   Message from Billey Co, MD sent at 10/17/2020  7:20 AM EST ----- Great news, no evidence of recurrence on CT scans! Keep follow up as scheduled  Nickolas Madrid, MD 10/17/2020

## 2020-10-17 NOTE — Telephone Encounter (Signed)
Patient and her husband returned the call.  Message from Dr. Diamantina Providence read to the patient.  They confirmed the cysto appt on Wednesday.

## 2020-10-19 ENCOUNTER — Encounter: Payer: Self-pay | Admitting: Urology

## 2020-10-19 ENCOUNTER — Other Ambulatory Visit: Payer: Self-pay

## 2020-10-19 ENCOUNTER — Ambulatory Visit: Payer: Medicare HMO | Admitting: Urology

## 2020-10-19 VITALS — BP 162/82 | HR 89 | Ht 61.0 in

## 2020-10-19 DIAGNOSIS — Z8551 Personal history of malignant neoplasm of bladder: Secondary | ICD-10-CM

## 2020-10-19 NOTE — Progress Notes (Signed)
   10/19/2020 1:46 PM   Misty Ferguson 1933/11/02 [id number]  Reason for visit: Follow up muscle invasive bladder cancer  HPI: -Presented 04/2019 with gross hematuria, CTU showed 3cm left sided bladder mass with left hydronephrosis, sCr 1.46(eGFR33). Refused chest CT. -TURBT 05/22/2019 with resection of all visible disease(HG T2 urothelial cell carcinoma with squamous differentiation), left ureteral stent placement. Renal function improved after stent to 0.98(eGFR 54), stent removed in follow-up -Radiation to bladder and pelvic nodes with Dr. Baruch Gouty completed 07/2019, she refused concurrent chemotherapy  She has opted for a every 6 month CT and cystoscopy surveillance secondary to her age and comorbidities, especially her severe emphysema on O2  She was scheduled for cystoscopy today but would like to defer further cystoscopies.  I think she has a very good understanding of her comorbidities and is okay deferring cystoscopy and understands the risk of missing recurrence and opportunity for window of cure.  She is doing very well and denies any urinary symptoms, flank pain, gross hematuria.  I personally reviewed and interpreted her CT C/A/P from 10/14/2020 that shows no evidence of recurrence  Using shared decision making, we opted for a renal/bladder ultrasound in 6 months, and if that is benign, continue yearly ultrasound surveillance secondary to cost invasiveness with her comorbidities.  She understands the risk of recurrence, and missing a window of care  Renal/bladder ultrasound 6 months, call with results-> if negative continue yearly ultrasound surveillance  Billey Co, [place] [address] [place], [place] [address] [phone]

## 2020-10-20 ENCOUNTER — Telehealth: Payer: Self-pay | Admitting: Family Medicine

## 2020-10-20 DIAGNOSIS — R7303 Prediabetes: Secondary | ICD-10-CM

## 2020-10-20 DIAGNOSIS — E7800 Pure hypercholesterolemia, unspecified: Secondary | ICD-10-CM

## 2020-10-20 NOTE — Telephone Encounter (Signed)
-----   Message from Cloyd Stagers, RT sent at 10/17/2020  2:43 PM EST ----- Regarding: Lab Orders for Tuesday 3.22.2022 Please place lab orders for Tuesday 3.22.2022, office visit for physical on Tuesday 3.29.2022 Thank you, Dyke Maes RT(R)

## 2020-10-21 LAB — MICROSCOPIC EXAMINATION
Bacteria, UA: NONE SEEN
RBC, Urine: NONE SEEN /hpf (ref 0–2)

## 2020-10-21 LAB — URINALYSIS, COMPLETE
Bilirubin, UA: NEGATIVE
Glucose, UA: NEGATIVE
Ketones, UA: NEGATIVE
Nitrite, UA: NEGATIVE
Protein,UA: NEGATIVE
RBC, UA: NEGATIVE
Specific Gravity, UA: 1.015 (ref 1.005–1.030)
Urobilinogen, Ur: 0.2 mg/dL (ref 0.2–1.0)
pH, UA: 8 — ABNORMAL HIGH (ref 5.0–7.5)

## 2020-10-24 ENCOUNTER — Other Ambulatory Visit: Payer: Self-pay | Admitting: Internal Medicine

## 2020-11-01 ENCOUNTER — Ambulatory Visit (INDEPENDENT_AMBULATORY_CARE_PROVIDER_SITE_OTHER): Payer: Medicare HMO

## 2020-11-01 ENCOUNTER — Other Ambulatory Visit: Payer: Self-pay

## 2020-11-01 ENCOUNTER — Other Ambulatory Visit (INDEPENDENT_AMBULATORY_CARE_PROVIDER_SITE_OTHER): Payer: Medicare HMO

## 2020-11-01 VITALS — BP 146/80 | Temp 97.4°F | Wt 98.0 lb

## 2020-11-01 DIAGNOSIS — R7303 Prediabetes: Secondary | ICD-10-CM

## 2020-11-01 DIAGNOSIS — E7800 Pure hypercholesterolemia, unspecified: Secondary | ICD-10-CM | POA: Diagnosis not present

## 2020-11-01 DIAGNOSIS — Z0000 Encounter for general adult medical examination without abnormal findings: Secondary | ICD-10-CM | POA: Diagnosis not present

## 2020-11-01 LAB — COMPREHENSIVE METABOLIC PANEL
ALT: 18 U/L (ref 0–35)
AST: 17 U/L (ref 0–37)
Albumin: 4.4 g/dL (ref 3.5–5.2)
Alkaline Phosphatase: 51 U/L (ref 39–117)
BUN: 23 mg/dL (ref 6–23)
CO2: 31 mEq/L (ref 19–32)
Calcium: 9.5 mg/dL (ref 8.4–10.5)
Chloride: 102 mEq/L (ref 96–112)
Creatinine, Ser: 0.92 mg/dL (ref 0.40–1.20)
GFR: 57.21 mL/min — ABNORMAL LOW (ref >60.00)
Glucose, Bld: 117 mg/dL — ABNORMAL HIGH (ref 70–99)
Potassium: 4.9 mEq/L (ref 3.5–5.1)
Sodium: 139 mEq/L (ref 135–145)
Total Bilirubin: 0.4 mg/dL (ref 0.2–1.2)
Total Protein: 6.1 g/dL (ref 6.0–8.3)

## 2020-11-01 LAB — LIPID PANEL
Cholesterol: 238 mg/dL — ABNORMAL HIGH (ref 0–200)
HDL: 124.9 mg/dL (ref >39.00)
LDL Cholesterol: 102 mg/dL — ABNORMAL HIGH (ref 0–99)
NonHDL: 113.05
Total CHOL/HDL Ratio: 2
Triglycerides: 56 mg/dL (ref 0.0–149.0)
VLDL: 11.2 mg/dL (ref 0.0–40.0)

## 2020-11-01 LAB — HEMOGLOBIN A1C: Hgb A1c MFr Bld: 5.6 % (ref 4.6–6.5)

## 2020-11-01 NOTE — Progress Notes (Signed)
PCP notes:  Health Maintenance: Covid- declined Dexa- declined   Abnormal Screenings: none   Patient concerns: Spots on both legs   Nurse concerns: none   Next PCP appt.: 11/08/2020 @ 8:40 am

## 2020-11-01 NOTE — Patient Instructions (Signed)
Misty Ferguson , Thank you for taking time to come for your Medicare Wellness Visit. I appreciate your ongoing commitment to your health goals. Please review the following plan we discussed and let me know if I can assist you in the future.   Screening recommendations/referrals: Colonoscopy: no longer required Mammogram: no longer required  Bone Density: declined Recommended yearly ophthalmology/optometry visit for glaucoma screening and checkup Recommended yearly dental visit for hygiene and checkup  Vaccinations: Influenza vaccine: Up to date, completed 04/27/2020, due 03/2021 Pneumococcal vaccine: Completed series Tdap vaccine: Up to date, completed 10/04/2016, due 09/2026 Shingles vaccine: due, check with your insurance regarding coverage if interested    Covid-19:declined  Advanced directives: Please bring a copy of your POA (Power of [place]) and/or Living Will to your next appointment.   Conditions/risks identified: hypertension, hypercholesterolemia   Next appointment: Follow up in one year for your annual wellness visit    Preventive Care 85 Years and Older, Female Preventive care refers to lifestyle choices and visits with your health care provider that can promote health and wellness. What does preventive care include?  A yearly physical exam. This is also called an annual well check.  Dental exams once or twice a year.  Routine eye exams. Ask your health care provider how often you should have your eyes checked.  Personal lifestyle choices, including:  Daily care of your teeth and gums.  Regular physical activity.  Eating a healthy diet.  Avoiding tobacco and drug use.  Limiting alcohol use.  Practicing safe sex.  Taking low-dose aspirin every day.  Taking vitamin and mineral supplements as recommended by your health care provider. What happens during an annual well check? The services and screenings done by your health care provider during your annual well  check will depend on your age, overall health, lifestyle risk factors, and family history of disease. Counseling  Your health care provider may ask you questions about your:  Alcohol use.  Tobacco use.  Drug use.  Emotional well-being.  Home and relationship well-being.  Sexual activity.  Eating habits.  History of falls.  Memory and ability to understand (cognition).  Work and work Statistician.  Reproductive health. Screening  You may have the following tests or measurements:  Height, weight, and BMI.  Blood pressure.  Lipid and cholesterol levels. These may be checked every 5 years, or more frequently if you are over 84 years old.  Skin check.  Lung cancer screening. You may have this screening every year starting at age 85 if you have a 30-pack-year history of smoking and currently smoke or have quit within the past 15 years.  Fecal occult blood test (FOBT) of the stool. You may have this test every year starting at age 85.  Flexible sigmoidoscopy or colonoscopy. You may have a sigmoidoscopy every 5 years or a colonoscopy every 10 years starting at age 55.  Hepatitis C blood test.  Hepatitis B blood test.  Sexually transmitted disease (STD) testing.  Diabetes screening. This is done by checking your blood sugar (glucose) after you have not eaten for a while (fasting). You may have this done every 1-3 years.  Bone density scan. This is done to screen for osteoporosis. You may have this done starting at age 85.  Mammogram. This may be done every 1-2 years. Talk to your health care provider about how often you should have regular mammograms. Talk with your health care provider about your test results, treatment options, and if necessary, the need for  more tests. Vaccines  Your health care provider may recommend certain vaccines, such as:  Influenza vaccine. This is recommended every year.  Tetanus, diphtheria, and acellular pertussis (Tdap, Td) vaccine. You  may need a Td booster every 10 years.  Zoster vaccine. You may need this after age 85.  Pneumococcal 13-valent conjugate (PCV13) vaccine. One dose is recommended after age 85.  Pneumococcal polysaccharide (PPSV23) vaccine. One dose is recommended after age 85. Talk to your health care provider about which screenings and vaccines you need and how often you need them. This information is not intended to replace advice given to you by your health care provider. Make sure you discuss any questions you have with your health care provider. Document Released: 08/26/2015 Document Revised: 04/18/2016 Document Reviewed: 05/31/2015 Elsevier Interactive Patient Education  2017 [employer] Prevention in the Home Falls can cause injuries. They can happen to people of all ages. There are many things you can do to make your home safe and to help prevent falls. What can I do on the outside of my home?  Regularly fix the edges of walkways and driveways and fix any cracks.  Remove anything that might make you trip as you walk through a door, such as a raised step or threshold.  Trim any bushes or trees on the path to your home.  Use bright outdoor lighting.  Clear any walking paths of anything that might make someone trip, such as rocks or tools.  Regularly check to see if handrails are loose or broken. Make sure that both sides of any steps have handrails.  Any raised decks and porches should have guardrails on the edges.  Have any leaves, snow, or ice cleared regularly.  Use sand or salt on walking paths during winter.  Clean up any spills in your garage right away. This includes oil or grease spills. What can I do in the bathroom?  Use night lights.  Install grab bars by the toilet and in the tub and shower. Do not use towel bars as grab bars.  Use non-skid mats or decals in the tub or shower.  If you need to sit down in the shower, use a plastic, non-slip stool.  Keep the floor  dry. Clean up any water that spills on the floor as soon as it happens.  Remove soap buildup in the tub or shower regularly.  Attach bath mats securely with double-sided non-slip rug tape.  Do not have throw rugs and other things on the floor that can make you trip. What can I do in the bedroom?  Use night lights.  Make sure that you have a light by your bed that is easy to reach.  Do not use any sheets or blankets that are too big for your bed. They should not hang down onto the floor.  Have a firm chair that has side arms. You can use this for support while you get dressed.  Do not have throw rugs and other things on the floor that can make you trip. What can I do in the kitchen?  Clean up any spills right away.  Avoid walking on wet floors.  Keep items that you use a lot in easy-to-reach places.  If you need to reach something above you, use a strong step stool that has a grab bar.  Keep electrical cords out of the way.  Do not use floor polish or wax that makes floors slippery. If you must use wax, use non-skid  floor wax.  Do not have throw rugs and other things on the floor that can make you trip. What can I do with my stairs?  Do not leave any items on the stairs.  Make sure that there are handrails on both sides of the stairs and use them. Fix handrails that are broken or loose. Make sure that handrails are as long as the stairways.  Check any carpeting to make sure that it is firmly attached to the stairs. Fix any carpet that is loose or worn.  Avoid having throw rugs at the top or bottom of the stairs. If you do have throw rugs, attach them to the floor with carpet tape.  Make sure that you have a light switch at the top of the stairs and the bottom of the stairs. If you do not have them, ask someone to add them for you. What else can I do to help prevent falls?  Wear shoes that:  Do not have high heels.  Have rubber bottoms.  Are comfortable and fit you  well.  Are closed at the toe. Do not wear sandals.  If you use a stepladder:  Make sure that it is fully opened. Do not climb a closed stepladder.  Make sure that both sides of the stepladder are locked into place.  Ask someone to hold it for you, if possible.  Clearly mark and make sure that you can see:  Any grab bars or handrails.  First and last steps.  Where the edge of each step is.  Use tools that help you move around (mobility aids) if they are needed. These include:  Canes.  Walkers.  Scooters.  Crutches.  Turn on the lights when you go into a dark area. Replace any light bulbs as soon as they burn out.  Set up your furniture so you have a clear path. Avoid moving your furniture around.  If any of your floors are uneven, fix them.  If there are any pets around you, be aware of where they are.  Review your medicines with your doctor. Some medicines can make you feel dizzy. This can increase your chance of falling. Ask your doctor what other things that you can do to help prevent falls. This information is not intended to replace advice given to you by your health care provider. Make sure you discuss any questions you have with your health care provider. Document Released: 05/26/2009 Document Revised: 01/05/2016 Document Reviewed: 09/03/2014 Elsevier Interactive Patient Education  2017 [employer].

## 2020-11-01 NOTE — Progress Notes (Signed)
No critical labs need to be addressed urgently. We will discuss labs in detail at upcoming office visit.   

## 2020-11-01 NOTE — Progress Notes (Addendum)
Subjective:   Misty Ferguson is a 87 y.o. female who presents for Medicare Annual (Subsequent) preventive examination.  Review of Systems: N/A     I connected with the patient today by telephone and verified that I am speaking with the correct person using two identifiers. Location patient: home Location nurse: work Persons participating in the telephone visit: patient, nurse.   I discussed the limitations, risks, security and privacy concerns of performing an evaluation and management service by telephone and the availability of in person appointments. I also discussed with the patient that there may be a patient responsible charge related to this service. The patient expressed understanding and verbally consented to this telephonic visit.       Cardiac Risk Factors include: advanced age (>81men, >28 women);hypertension;Other (see comment), Risk factor comments: hypercholesterolemia     Objective:    Today's Vitals   11/01/20 0941  BP: (!) 146/80  Temp: (!) 97.4 F (36.3 C)  Weight: 98 lb (44.5 kg)   Body mass index is 18.52 kg/m.  Advanced Directives 11/01/2020 10/28/2019 06/08/2019 05/18/2019 11/05/2017 10/23/2017 10/23/2017  Does Patient Have a Medical Advance Directive? Yes Yes Yes Yes Yes - Yes  Type of Advance Directive [place];Living will [place];Living will Living will;Healthcare Power of Attorney Living will;Healthcare Power of Attorney - Out of facility DNR (pink MOST or yellow form) Living will;Healthcare Power of Attorney  Does patient want to make changes to medical advance directive? - - - - No - Patient declined - No - Patient declined  Copy of [place] in Chart? No - copy requested No - copy requested - - - - No - copy requested    Current Medications (verified) Outpatient Encounter Medications as of 11/01/2020  Medication Sig  . albuterol (PROVENTIL) (2.5 MG/3ML) 0.083% nebulizer solution INHALE THE  CONTENTS OF 1 VIAL VIA NEBULIZER EVERY 6 HOURS AS NEEDED FOR WHEEZING OR FOR SHORTNESS OF BREATH  . ascorbic acid (VITAMIN C) 1000 MG tablet Take 1,000 mg by mouth daily.   . Cholecalciferol (VITAMIN D) 125 MCG (5000 UT) CAPS Take 5,000 Units by mouth daily.  Marland Kitchen docusate sodium (COLACE) 100 MG capsule Take 100 mg by mouth 2 (two) times daily.  Marland Kitchen losartan (COZAAR) 25 MG tablet 25 mg daily.   . Multiple Vitamins-Minerals (PRESERVISION AREDS) TABS Take 1 tablet by mouth 2 (two) times daily.  . Omega-3 Fatty Acids (FISH OIL) 1000 MG CAPS Take 1,000 mg by mouth 2 (two) times daily.   . OXYGEN Inhale 4 L into the lungs as needed.  . Tiotropium Bromide-Olodaterol (STIOLTO RESPIMAT) 2.5-2.5 MCG/ACT AERS Inhale 2 puffs into the lungs daily.  . verapamil (VERELAN PM) 360 MG 24 hr capsule Take 360 mg by mouth daily.  . Vitamin E 180 MG CAPS Take 180 mg by mouth daily.   No facility-administered encounter medications on file as of 11/01/2020.    Allergies (verified) Patient has no known allergies.   History: Past Medical History:  Diagnosis Date  . Burping   . Chronic airway obstruction, not elsewhere classified   . Dyspnea   . Dysrhythmia   . Macular degeneration (senile) of retina, unspecified   . Malignant neoplasm of urinary bladder ([place]) 03/2019   Partial bladder resection and Rad tx's.   . Obstructive chronic bronchitis with exacerbation ([place])   . Osteoporosis, unspecified   . Oxygen deficiency   . Personal history of peptic ulcer disease   . Tobacco  use disorder   . Unspecified essential hypertension   . Unspecified glaucoma(365.9)    Past Surgical History:  Procedure Laterality Date  . CATARACT EXTRACTION W/ INTRAOCULAR LENS  IMPLANT, BILATERAL    . CYSTOSCOPY WITH STENT PLACEMENT Left 05/22/2019   Procedure: CYSTOSCOPY WITH STENT PLACEMENT;  Surgeon: Billey Co, MD;  Location: [facility] ORS;  Service: Urology;  Laterality: Left;  . ELBOW FRACTURE SURGERY Left   . TRANSURETHRAL  RESECTION OF BLADDER TUMOR N/A 05/22/2019   Procedure: TRANSURETHRAL RESECTION OF BLADDER TUMOR (TURBT);  Surgeon: Billey Co, MD;  Location: [facility] ORS;  Service: Urology;  Laterality: N/A;   Family History  Adopted: Yes   Social History   Socioeconomic History  . Marital status: Married    Spouse name: Not on file  . Number of children: 3  . Years of education: Not on file  . Highest education level: Not on file  Occupational History  . Occupation: RETIRED    Employer: RETIRED  Tobacco Use  . Smoking status: Former Smoker    Packs/day: 1.30    Years: 45.00    Pack years: 58.50    Types: Cigarettes    Quit date: 08/14/2003    Years since quitting: 17.2  . Smokeless tobacco: Never Used  Vaping Use  . Vaping Use: Never used  Substance and Sexual Activity  . Alcohol use: Yes    Alcohol/week: 14.0 standard drinks    Types: 14 Glasses of wine per week    Comment: 1-2 wine daily  . Drug use: No  . Sexual activity: Yes    Birth control/protection: Post-menopausal  Other Topics Concern  . Not on file  Social History Narrative   Tobacco abuse x 45 years   Social Determinants of Health   Financial Resource Strain: Low Risk   . Difficulty of Paying Living Expenses: Not hard at all  Food Insecurity: No Food Insecurity  . Worried About Charity fundraiser in the Last Year: Never true  . Ran Out of Food in the Last Year: Never true  Transportation Needs: No Transportation Needs  . Lack of Transportation (Medical): No  . Lack of Transportation (Non-Medical): No  Physical Activity: Inactive  . Days of Exercise per Week: 0 days  . Minutes of Exercise per Session: 0 min  Stress: No Stress Concern Present  . Feeling of Stress : Not at all  Social Connections: Not on file    Tobacco Counseling Counseling given: Not Answered   Clinical Intake:  Pre-visit preparation completed: Yes  Pain : No/denies pain     Nutritional Risks: None Diabetes: No  How often do you  need to have someone help you when you read instructions, pamphlets, or other written materials from your doctor or pharmacy?: 1 - Never  Diabetic: No Nutrition Risk Assessment:  Has the patient had any N/V/D within the last 2 months?  No  Does the patient have any non-healing wounds?  No  Has the patient had any unintentional weight loss or weight gain?  No   Diabetes:  Is the patient diabetic?  No  If diabetic, was a CBG obtained today?  N/A Did the patient bring in their glucometer from home?  N/A How often do you monitor your CBG's? N/A.   Financial Strains and Diabetes Management:  Are you having any financial strains with the device, your supplies or your medication? N/A.  Does the patient want to be seen by Chronic Care Management for management of  their diabetes?  N/A Would the patient like to be referred to a Nutritionist or for Diabetic Management?  N/A  Interpreter Needed?: No  Information entered by :: CJohnson, LPN   Activities of Daily Living In your present state of health, do you have any difficulty performing the following activities: 11/01/2020  Hearing? N  Vision? 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 N  Walking or climbing stairs? Y  Comment on oxygen  Dressing or bathing? N  Doing errands, shopping? Y  Comment on oxygen  Preparing Food and eating ? N  Using the Toilet? N  In the past six months, have you accidently leaked urine? N  Do you have problems with loss of bowel control? N  Managing your Medications? N  Managing your Finances? N  Housekeeping or managing your Housekeeping? Y  Comment husband helps with this  Some recent data might be hidden    Patient Care Team: [name], MD as PCP - General Flora Lipps, MD as Consulting Physician (Pulmonary Disease) Birder Robson, MD as Referring Physician (Ophthalmology) Ubaldo Glassing Javier Docker, MD as Consulting Physician (Cardiology) [name], [facility] as  Pharmacist (Pharmacist)  Indicate any recent Medical Services you may have received from other than [facility] providers in the past year (date may be approximate).     Assessment:   This is a routine wellness examination for Tess.  Hearing/Vision screen  Hearing Screening   125Hz  250Hz  500Hz  1000Hz  2000Hz  3000Hz  4000Hz  6000Hz  8000Hz   Right ear:           Left ear:           Vision Screening Comments: Patient gets annual eye exams   Dietary issues and exercise activities discussed: Current Exercise Habits: The patient does not participate in regular exercise at present, Exercise limited by: respiratory conditions(s)  Goals    . Increase physical activity     Starting 10/04/2016, I will continue to exercise at least 40 min daily.     . Increase physical activity     Starting 10/11/2017, I will continue to walk on treadmill for 15-20 minutes 5 days per week.     . Patient Stated     10/28/2019, I will continue to ride my treadmill 5 days a week for 12 minutes.    . Patient Stated     11/01/2020, I will continue to do chair exercises daily for about 10-15 minutes.     . Pharmacy Care Plan     CARE PLAN ENTRY  Current Barriers:  . Chronic Disease Management support, education, and care coordination needs related to Hypertension and COPD   Hypertension BP Readings from Last 3 Encounters:  04/20/20 (!) 148/79  10/30/19 132/60  10/28/19 (!) 144/81   . Pharmacist Clinical Goal(s): o Over the next 6 months, patient will work with PharmD and providers to achieve BP goal <140/90 mmHg . Current regimen:   Losartan 25 mg - 1 tablet daily  Verapamil 360 mg - 1 tablet daily . Interventions: o Reviewed recent BP readings  o Reviewed labs: kidney function, potassium levels  o Recommend continuing current therapy . Patient self care activities - Over the next 6 months, patient will: o Check BP once monthly and 5 days prior to any office visits, document, and provide at future  appointments o Ensure daily salt intake < 2300 mg/day  COPD . Pharmacist Clinical Goal(s) o Over the next 6 months, patient will work with PharmD and  providers to improve shortness of breath . Current regimen:  o Stiolto 2.5-2.5 mcg - Inhale 2 puffs daily  . Interventions: . Contact insurance to review cost of Stiolto alternatives - Per insurance, Stiolto is the preferred and lowest cost option in the drug class, Anoro is not covered. . Discussed patient assistance options - patient declined application [id number]/1096 . Patient self care activities - Over the next 6 months, patient will: Marland Kitchen Discuss inhaled steroid therapy with pulmonologist   Please see past updates related to this goal by clicking on the "Past Updates" button in the selected goal        Depression Screen PHQ 2/9 Scores 11/01/2020 10/28/2019 11/05/2017 10/11/2017 10/04/2016 09/22/2015 09/21/2014  PHQ - 2 Score 0 0 1 0 0 0 0  PHQ- 9 Score 0 0 - 0 - - -    Fall Risk Fall Risk  11/01/2020 10/28/2019 11/28/2017 11/05/2017 10/11/2017  Falls in the past year? 0 1 Yes Yes No  Comment - lightheadedness - - -  Number falls in past yr: 0 1 1 1  -  Injury with Fall? 0 0 Yes Yes -  Risk Factor Category  - - High Fall Risk High Fall Risk -  Risk for fall due to : Medication side effect Medication side effect;History of fall(s) History of fall(s) History of fall(s);Impaired balance/gait -  Follow up Falls evaluation completed;Falls prevention discussed Falls evaluation completed;Falls prevention discussed Falls evaluation completed;Education provided Falls prevention discussed -    FALL RISK PREVENTION PERTAINING TO THE HOME:  Any stairs in or around the home? Yes  If so, are there any without handrails? No  Home free of loose throw rugs in walkways, pet beds, electrical cords, etc? Yes  Adequate lighting in your home to reduce risk of falls? Yes   ASSISTIVE DEVICES UTILIZED TO PREVENT FALLS:  Life alert? No  Use of a cane, walker or w/c? No   Grab bars in the bathroom? No  Shower chair or bench in shower? No  Elevated toilet seat or a handicapped toilet? No   TIMED UP AND GO:  Was the test performed? N/A telephone visit .   Cognitive Function: MMSE - Mini Mental State Exam 11/01/2020 10/28/2019 10/11/2017 10/04/2016  Orientation to time 5 5 5 5   Orientation to Place 5 5 5 5   Registration 3 3 3 3   Attention/ Calculation 5 5 0 0  Recall 3 3 3 3   Language- name 2 objects - - 0 0  Language- repeat 1 1 1 1   Language- follow 3 step command - - 3 3  Language- read & follow direction - - 0 0  Write a sentence - - 0 0  Copy design - - 0 0  Total score - - 20 20  Mini Cog  Mini-Cog screen was completed. Maximum score is 22. A value of 0 denotes this part of the MMSE was not completed or the patient failed this part of the Mini-Cog screening.       Immunizations Immunization History  Administered Date(s) Administered  . Fluad Quad(high Dose 65+) 04/09/2019, 04/27/2020  . H1N1 10/04/2008  . Influenza Split 04/21/2011, 05/12/2012  . Influenza Whole 05/27/2007, 05/08/2008, 05/04/2009, 04/26/2010  . Influenza,inj,Quad PF,6+ Mos 05/06/2013, 05/06/2014, 04/21/2015, 05/11/2016, 04/17/2017, 05/01/2018  . Pneumococcal Conjugate-13 09/21/2014  . Pneumococcal Polysaccharide-23 03/13/2006, 06/28/2011  . Td 03/13/2006  . Tdap 10/04/2016  . Zoster 09/05/2010    TDAP status: Up to date  Flu Vaccine status: Up to date  Pneumococcal  vaccine status: Up to date  Covid-19 vaccine status: Declined, Education has been provided regarding the importance of this vaccine but patient still declined. Advised may receive this vaccine at local pharmacy or Health Dept.or vaccine clinic. Aware to provide a copy of the vaccination record if obtained from local pharmacy or Health Dept. Verbalized acceptance and understanding.  Qualifies for Shingles Vaccine? Yes   Zostavax completed Yes   Shingrix Completed?: No.    Education has been provided  regarding the importance of this vaccine. Patient has been advised to call insurance company to determine out of pocket expense if they have not yet received this vaccine. Advised may also receive vaccine at local pharmacy or Health Dept. Verbalized acceptance and understanding.  Screening Tests Health Maintenance  Topic Date Due  . COVID-19 Vaccine (1) 11/17/2021 (Originally 05/30/1946)  . TETANUS/TDAP  10/04/2026  . INFLUENZA VACCINE  Completed  . DEXA SCAN  Completed  . PNA vac Low Risk Adult  Completed  . HPV VACCINES  Aged Out    Health Maintenance  There are no preventive care reminders to display for this patient.  Colorectal cancer screening: No longer required.   Mammogram status: No longer required due to age.  Bone Density status: declined  Lung Cancer Screening: (Low Dose CT Chest recommended if Age 52-80 years, 30 pack-year currently smoking OR have quit w/in 15 years.) does not qualify.   Additional Screening:  Hepatitis C Screening: does not qualify; Completed N/A  Vision Screening: Recommended annual ophthalmology exams for early detection of glaucoma and other disorders of the eye. Is the patient up to date with their annual eye exam?  Yes  Who is the provider or what is the name of the office in which the patient attends annual eye exams? Dr. Linton Flemings, [facility] If pt is not established with a provider, would they like to be referred to a provider to establish care? No .   Dental Screening: Recommended annual dental exams for proper oral hygiene  Community Resource Referral / Chronic Care Management: CRR required this visit?  No   CCM required this visit?  No      Plan:     I have personally reviewed and noted the following in the patient's chart:   . Medical and social history . Use of alcohol, tobacco or illicit drugs  . Current medications and supplements . Functional ability and status . Nutritional status . Physical  activity . Advanced directives . List of other physicians . Hospitalizations, surgeries, and ER visits in previous 12 months . Vitals . Screenings to include cognitive, depression, and falls . Referrals and appointments  In addition, I have reviewed and discussed with patient certain preventive protocols, quality metrics, and best practice recommendations. A written personalized care plan for preventive services as well as general preventive health recommendations were provided to patient.   Due to this being a telephonic visit, the after visit summary with patients personalized plan was offered to patient via office or my-chart. Patient preferred to pick up at office at next visit or via mychart.   Andrez Grime, LPN   05/03/1938

## 2020-11-02 DIAGNOSIS — J9611 Chronic respiratory failure with hypoxia: Secondary | ICD-10-CM | POA: Diagnosis not present

## 2020-11-08 ENCOUNTER — Other Ambulatory Visit: Payer: Self-pay

## 2020-11-08 ENCOUNTER — Ambulatory Visit (INDEPENDENT_AMBULATORY_CARE_PROVIDER_SITE_OTHER): Payer: Medicare HMO | Admitting: Family Medicine

## 2020-11-08 ENCOUNTER — Encounter: Payer: Self-pay | Admitting: Family Medicine

## 2020-11-08 VITALS — BP 140/72 | HR 88 | Temp 97.4°F | Ht 61.0 in | Wt 99.0 lb

## 2020-11-08 DIAGNOSIS — Z0000 Encounter for general adult medical examination without abnormal findings: Secondary | ICD-10-CM

## 2020-11-08 DIAGNOSIS — E7800 Pure hypercholesterolemia, unspecified: Secondary | ICD-10-CM | POA: Diagnosis not present

## 2020-11-08 DIAGNOSIS — J9611 Chronic respiratory failure with hypoxia: Secondary | ICD-10-CM

## 2020-11-08 DIAGNOSIS — R7303 Prediabetes: Secondary | ICD-10-CM | POA: Diagnosis not present

## 2020-11-08 DIAGNOSIS — L989 Disorder of the skin and subcutaneous tissue, unspecified: Secondary | ICD-10-CM | POA: Diagnosis not present

## 2020-11-08 DIAGNOSIS — J432 Centrilobular emphysema: Secondary | ICD-10-CM

## 2020-11-08 DIAGNOSIS — Z8551 Personal history of malignant neoplasm of bladder: Secondary | ICD-10-CM | POA: Diagnosis not present

## 2020-11-08 DIAGNOSIS — I10 Essential (primary) hypertension: Secondary | ICD-10-CM | POA: Diagnosis not present

## 2020-11-08 NOTE — Assessment & Plan Note (Signed)
On continuous oxygen 

## 2020-11-08 NOTE — Assessment & Plan Note (Signed)
Stable control. 

## 2020-11-08 NOTE — Assessment & Plan Note (Addendum)
Tolerable control for age and c-omorbidities.  she does not wish to treat with statin.

## 2020-11-08 NOTE — Progress Notes (Signed)
Patient ID: Misty Ferguson, female    DOB: 18-Dec-1933, 87 y.o.   MRN: [id number]  This visit was conducted in person.  BP (!) 160/90   Pulse 88   Temp (!) 97.4 F (36.3 C) (Temporal)   Ht 5' 1" (1.549 m)   Wt 99 lb (44.9 kg)   SpO2 95% Comment: 3 L 02  BMI 18.71 kg/m    CC:  Chief Complaint  Patient presents with  . Annual Exam    Part 2    Subjective:   HPI: Misty Ferguson is a 87 y.o. female presenting on 11/08/2020 for Annual Exam (Part 2)  The patient saw a LPN or RN for medicare wellness visit.  Prevention and wellness was reviewed in detail. Note reviewed and important notes copied below. Health Maintenance: Covid- declined Dexa- declined Abnormal Screenings: none  11/08/20 History of bladder cancer followed by Dr. Chryst[facility]. TURBT 05/22/2019 with resection of all visible disease(HG T2 urothelial cell carcinoma with squamous differentiation), left ureteral stent placement. Renal function improved after stent to 0.98(eGFR 54), stent removed in follow-up -Radiation to bladder and pelvic nodes with Dr. Baruch Gouty completed 07/2019, she refused concurrent chemotherapy  She has opted for a every 6 month CT and cystoscopy surveillance secondary to her age and co-morbidities.. for cost has changed to [place] every 6 months.  No current hematuria  Hypertension:   Elevated in office on losartan and Verpamil.. was panicky about running out of oxygen. After recheck in office after visit: 140/72.  Followed by Dr. Ubaldo Glassing Cardiology... at [place] 09/15/2020 BP was 124/78 BP Readings from Last 3 Encounters:  11/08/20 (!) 160/90  11/01/20 (!) 146/80  10/19/20 (!) 162/82  Using medication without problems or lightheadedness: none Chest pain with exertion: none Edema:none Short of breath: yes Average home BPs: Other issues:  exercise: walking around house.. limited by  Prediabetes   Lab Results  Component Value Date   HGBA1C 5.6 11/01/2020    Lab Results  Component Value  Date   CHOL 238 (H) 11/01/2020   HDL 124.90 11/01/2020   LDLCALC 102 (H) 11/01/2020   LDLDIRECT 99.1 09/08/2013   TRIG 56.0 11/01/2020   CHOLHDL 2 11/01/2020   COPD, emphysema severe: on almost continuous oxygen. Fairly stable.  On Stiolto inhaler and albuterol prn.  Followed by pulmonary. Dr. Mortimer Fries. Last OV reviewed from 07/2020   She has noted skin lesion on right lower leg.. increasing in size.  No itching , no bleeding.     Relevant past medical, surgical, family and social history reviewed and updated as indicated. Interim medical history since our last visit reviewed. Allergies and medications reviewed and updated. Outpatient Medications Prior to Visit  Medication Sig Dispense Refill  . albuterol (PROVENTIL) (2.5 MG/3ML) 0.083% nebulizer solution INHALE THE CONTENTS OF 1 VIAL VIA NEBULIZER EVERY 6 HOURS AS NEEDED FOR WHEEZING OR FOR SHORTNESS OF BREATH 360 mL 3  . ascorbic acid (VITAMIN C) 1000 MG tablet Take 1,000 mg by mouth daily.     . Cholecalciferol (VITAMIN D) 125 MCG (5000 UT) CAPS Take 5,000 Units by mouth daily.    Marland Kitchen docusate sodium (COLACE) 100 MG capsule Take 100 mg by mouth 2 (two) times daily.    Marland Kitchen losartan (COZAAR) 25 MG tablet 25 mg daily.     . Multiple Vitamins-Minerals (PRESERVISION AREDS) TABS Take 1 tablet by mouth 2 (two) times daily.    . Omega-3 Fatty Acids (FISH OIL) 1000 MG CAPS Take 1,000 mg  by mouth 2 (two) times daily.     . OXYGEN Inhale 4 L into the lungs as needed.    . Tiotropium Bromide-Olodaterol (STIOLTO RESPIMAT) 2.5-2.5 MCG/ACT AERS Inhale 2 puffs into the lungs daily. 12 g 3  . verapamil (VERELAN PM) 360 MG 24 hr capsule Take 360 mg by mouth daily.    . Vitamin E 180 MG CAPS Take 180 mg by mouth daily.     No facility-administered medications prior to visit.     Per HPI unless specifically indicated in ROS section below Review of Systems  Constitutional: Negative for fatigue and fever.  HENT: Negative for congestion.   Eyes: Negative  for pain.  Respiratory: Negative for cough and shortness of breath.   Cardiovascular: Negative for chest pain, palpitations and leg swelling.  Gastrointestinal: Negative for abdominal pain.  Genitourinary: Negative for dysuria and vaginal bleeding.  Musculoskeletal: Negative for back pain.  Neurological: Negative for syncope, light-headedness and headaches.  Psychiatric/Behavioral: Negative for dysphoric mood.   Objective:  BP (!) 160/90   Pulse 88   Temp (!) 97.4 F (36.3 C) (Temporal)   Ht 5' 1" (1.549 m)   Wt 99 lb (44.9 kg)   SpO2 95% Comment: 3 L 02  BMI 18.71 kg/m   Wt Readings from Last 3 Encounters:  11/08/20 99 lb (44.9 kg)  11/01/20 98 lb (44.5 kg)  07/15/20 99 lb (44.9 kg)      Physical Exam Constitutional:      General: She is not in acute distress.    Appearance: Normal appearance. She is well-developed. She is not ill-appearing or toxic-appearing.     Comments: Elderly  on oxygen  HENT:     Head: Normocephalic.     Right Ear: Hearing, tympanic membrane, ear canal and external ear normal. Tympanic membrane is not erythematous, retracted or bulging.     Left Ear: Hearing, tympanic membrane, ear canal and external ear normal. Tympanic membrane is not erythematous, retracted or bulging.     Nose: No mucosal edema or rhinorrhea.     Right Sinus: No maxillary sinus tenderness or frontal sinus tenderness.     Left Sinus: No maxillary sinus tenderness or frontal sinus tenderness.     Mouth/Throat:     Pharynx: Uvula midline.  Eyes:     General: Lids are normal. Lids are everted, no foreign bodies appreciated.     Conjunctiva/sclera: Conjunctivae normal.     Pupils: Pupils are equal, round, and reactive to light.  Neck:     Thyroid: No thyroid mass or thyromegaly.     Vascular: No carotid bruit.     Trachea: Trachea normal.  Cardiovascular:     Rate and Rhythm: Normal rate and regular rhythm.     Pulses: Normal pulses.     Heart sounds: Normal heart sounds, S1  normal and S2 normal. No murmur heard. No friction rub. No gallop.   Pulmonary:     Effort: Pulmonary effort is normal. No tachypnea or respiratory distress.     Breath sounds: Normal breath sounds. Decreased air movement present. No decreased breath sounds, wheezing, rhonchi or rales.  Abdominal:     General: Bowel sounds are normal.     Palpations: Abdomen is soft.     Tenderness: There is no abdominal tenderness.  Musculoskeletal:     Cervical back: Normal range of motion and neck supple.  Skin:    General: Skin is warm and dry.     Findings: No rash.  Comments:  Pale colored, flat warty appearance of lower lesion, scab in center,  Superior lesion raised with rolled borders.  Neurological:     Mental Status: She is alert.  Psychiatric:        Mood and Affect: Mood is not anxious or depressed.        Speech: Speech normal.        Behavior: Behavior normal. Behavior is cooperative.        Thought Content: Thought content normal.        Judgment: Judgment normal.       Results for orders placed or performed in visit on 11/01/20  Hemoglobin A1c  Result Value Ref Range   Hgb A1c MFr Bld 5.6 4.6 - 6.5 %  Comprehensive metabolic panel  Result Value Ref Range   Sodium 139 135 - 145 mEq/L   Potassium 4.9 3.5 - 5.1 mEq/L   Chloride 102 96 - 112 mEq/L   CO2 31 19 - 32 mEq/L   Glucose, Bld 117 (H) 70 - 99 mg/dL   BUN 23 6 - 23 mg/dL   Creatinine, Ser 0.92 0.40 - 1.20 mg/dL   Total Bilirubin 0.4 0.2 - 1.2 mg/dL   Alkaline Phosphatase 51 39 - 117 U/L   AST 17 0 - 37 U/L   ALT 18 0 - 35 U/L   Total Protein 6.1 6.0 - 8.3 g/dL   Albumin 4.4 3.5 - 5.2 g/dL   GFR 57.21 (L) >60.00 mL/min   Calcium 9.5 8.4 - 10.5 mg/dL  Lipid panel  Result Value Ref Range   Cholesterol 238 (H) 0 - 200 mg/dL   Triglycerides 56.0 0.0 - 149.0 mg/dL   HDL 124.90 >39.00 mg/dL   VLDL 11.2 0.0 - 40.0 mg/dL   LDL Cholesterol 102 (H) 0 - 99 mg/dL   Total CHOL/HDL Ratio 2    NonHDL 113.05      This visit occurred during the SARS-CoV-2 public health emergency.  Safety protocols were in place, including screening questions prior to the visit, additional usage of staff PPE, and extensive cleaning of exam room while observing appropriate contact time as indicated for disinfecting solutions.   COVID 19 screen:  No recent travel or known exposure to COVID19 The patient denies respiratory symptoms of COVID 19 at this time. The importance of social distancing was discussed today.   Assessment and Plan   The patient's preventative maintenance and recommended screening tests for an annual wellness exam were reviewed in full today. Brought up to date unless services declined.  Counselled on the importance of diet, exercise, and its role in overall health and mortality. The patient's FH and SH was reviewed, including their home life, tobacco status, and drug and alcohol status.   Vaccines:Uptodate. Encouraged COVID vaccine Mammogram: last in 2009, not interested in continuing to follow.  DEXA: on fosamax (many years about 10 years) last checked 2009t -3.11.Marland Kitchen Stopped fosamax2012,  05/2016 femoral neck -2.7 After discussion.. she has chosen not to continue bone densities as she would not use a med to treat. Colon: Not interested in colon cancer screening. Discussed the risks and benefits of cancer screening... Pt voices understanding an chooses to not proceed with prevention as noted above.  " I am ready to go if the lord takes me" No indication for pap/DVE.   Problem List Items Addressed This Visit    Centrilobular emphysema (HCC)    Severe, followed by pulmonary.       Chronic hypoxemic respiratory failure (  [place])    On continuous oxygen.      Essential hypertension, benign    Stable, chronic.  Continue current medication.  Losartan 25 mg daily Verapamil 360 mg daily        History of bladder cancer     Followed by Urology      HYPERCHOLESTEROLEMIA     Tolerable control for age and c-omorbidities.  she does not wish to treat with statin.      Prediabetes    Stable control.      Skin lesions    3 lesions on right lower leg.. concerning for basal or squamous cell Ca.  Refer to derm for removal.       Other Visit Diagnoses    Routine general medical examination at a health care facility    -  Primary       Eliezer Lofts, MD

## 2020-11-08 NOTE — Assessment & Plan Note (Signed)
Followed by Urology 

## 2020-11-08 NOTE — Assessment & Plan Note (Signed)
Stable, chronic.  Continue current medication.  Losartan 25 mg daily Verapamil 360 mg daily

## 2020-11-08 NOTE — Assessment & Plan Note (Signed)
Severe, followed by pulmonary.

## 2020-11-08 NOTE — Patient Instructions (Addendum)
Call Dr. Nehemiah Massed for removal of concerning skin lesion on right lower leg... possible basal cell or squamous cell carcinoma.

## 2020-11-08 NOTE — Assessment & Plan Note (Signed)
3 lesions on right lower leg.. concerning for basal or squamous cell Ca.  Refer to derm for removal.

## 2020-11-13 DIAGNOSIS — J449 Chronic obstructive pulmonary disease, unspecified: Secondary | ICD-10-CM | POA: Diagnosis not present

## 2020-11-24 ENCOUNTER — Telehealth: Payer: Self-pay

## 2020-11-24 NOTE — Chronic Care Management (AMB) (Addendum)
Chronic Care Management Pharmacy Assistant   Name: Misty Ferguson  MRN: [id number] DOB: 02/27/1934   Reason for Encounter: Disease State- Hypertension   Conditions to be addressed/monitored: HTN  Recent office visits:  11/08/20- Dr, Eliezer Lofts- PCP - Referral to dermatology for 3 lesions right lower leg. Follow up 1 year.  Recent consult visits:  10/19/20- Dr. Nickolas Madrid- Urology - Ordered Renal/Bladder US in 6 months. Patient states she completed course on azithromycin 250 mg, budesinide 0.5 mg neb., formoterol fumarate 20 mcg neb., mupirocin 2%, prednisone 20 mg, silver sulfadiazine. 09/15/20- Dr. Bartholome Bill- Cardiology- No changes 07/20/20- Telephone encounter- Pulmonology - discontinued Brovana and Pulmicort - not covered by insurance. Patient will continue Stiolto only. 07/15/20- Dr. Flora Lipps- Pulmonology- Started Brovana 15 mcg/71mL, Started short course of Z-Pak, Started Pulmicort 0.5mg /29mL, start Prednisone 20 mg for 10 days.   Hospital visits:  None in previous 6 months  Medications: Outpatient Encounter Medications as of 11/24/2020  Medication Sig   albuterol (PROVENTIL) (2.5 MG/3ML) 0.083% nebulizer solution INHALE THE CONTENTS OF 1 VIAL VIA NEBULIZER EVERY 6 HOURS AS NEEDED FOR WHEEZING OR FOR SHORTNESS OF BREATH   ascorbic acid (VITAMIN C) 1000 MG tablet Take 1,000 mg by mouth daily.    Cholecalciferol (VITAMIN D) 125 MCG (5000 UT) CAPS Take 5,000 Units by mouth daily.   docusate sodium (COLACE) 100 MG capsule Take 100 mg by mouth 2 (two) times daily.   losartan (COZAAR) 25 MG tablet 25 mg daily.    Multiple Vitamins-Minerals (PRESERVISION AREDS) TABS Take 1 tablet by mouth 2 (two) times daily.   Omega-3 Fatty Acids (FISH OIL) 1000 MG CAPS Take 1,000 mg by mouth 2 (two) times daily.    OXYGEN Inhale 4 L into the lungs as needed.   Tiotropium Bromide-Olodaterol (STIOLTO RESPIMAT) 2.5-2.5 MCG/ACT AERS Inhale 2 puffs into the lungs daily.   verapamil (VERELAN PM) 360  MG 24 hr capsule Take 360 mg by mouth daily.   Vitamin E 180 MG CAPS Take 180 mg by mouth daily.   No facility-administered encounter medications on file as of 11/24/2020.   Reviewed chart prior to disease state call. Spoke with patient regarding BP  Recent Office Vitals: BP Readings from Last 3 Encounters:  11/08/20 140/72  11/01/20 (!) 146/80  10/19/20 (!) 162/82   Pulse Readings from Last 3 Encounters:  11/08/20 88  10/19/20 89  07/15/20 77    Wt Readings from Last 3 Encounters:  11/08/20 99 lb (44.9 kg)  11/01/20 98 lb (44.5 kg)  07/15/20 99 lb (44.9 kg)     Kidney Function Lab Results  Component Value Date/Time   CREATININE 0.92 11/01/2020 08:02 AM   CREATININE 0.90 10/14/2020 08:46 AM   CREATININE 1.26 (H) 12/23/2012 02:40 PM   CREATININE 1.12 (H) 08/25/2012 10:00 AM   GFR 57.21 (L) 11/01/2020 08:02 AM   GFRNONAA 54 (L) 06/10/2019 01:24 PM   GFRAA 62 06/10/2019 01:24 PM    BMP Latest Ref Rng & Units 11/01/2020 10/14/2020 04/15/2020  Glucose 70 - 99 mg/dL 117(H) - -  BUN 6 - 23 mg/dL 23 - -  Creatinine 0.40 - 1.20 mg/dL 0.92 0.90 0.90  BUN/Creat Ratio 12 - 28 - - -  Sodium 135 - 145 mEq/L 139 - -  Potassium 3.5 - 5.1 mEq/L 4.9 - -  Chloride 96 - 112 mEq/L 102 - -  CO2 19 - 32 mEq/L 31 - -  Calcium 8.4 - 10.5 mg/dL 9.5 - -  Current antihypertensive regimen:  Losartan 25 mg - 1 tablet daily Verapamil 360 mg - 1 tablet daily  How often are you checking your Blood Pressure? infrequently   Current home BP readings: Does not keep track of readings. States readings have been good. Agreed to check weekly until follow up with [facility] 01/02/21. Uses arm cuff.  What recent interventions/DTPs have been made by any provider to improve Blood Pressure control since last CPP Visit:  No recent interventions.   Any recent hospitalizations or ED visits since last visit with CPP? No   What diet changes have been made to improve Blood Pressure Control?  No changes in diet.  States she has been told to "eat more".  What exercise is being done to improve your Blood Pressure Control?  Unable to exercise due to emphysema.   Adherence Review: Is the patient currently on ACE/ARB medication? Yes Does the patient have >5 day gap between last estimated fill dates? No  States that her pulmonologist prescribed 2 new medications for her emphysema and they were $1500 a month. She states that she can not afford that. She states she is in the donut hole usually by March. She would be interested in applying for patient assistance from manufacturer for these medications. She states in the past she has been denied for some type of assistance due to her investments. 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    Star Rating Drugs: Losartan 25 mg 10/24/20  90Ds   Follow-Up:  Patient Assistance Coordination and Pharmacist Review  [name], CPP notified  Margaretmary Dys, [place] Assistant [phone]  I have reviewed the care management and care coordination activities outlined in this encounter and I am certifying that I agree with the content of this note. Will see if pulmonology can assist with Stiolto PAP.  [name], PharmD Clinical Pharmacist [facility] [phone]

## 2020-11-25 NOTE — Telephone Encounter (Signed)
Scheduled CCM visit to discuss PAP options for inhalers.

## 2020-12-06 ENCOUNTER — Encounter: Payer: Self-pay | Admitting: Dermatology

## 2020-12-06 ENCOUNTER — Other Ambulatory Visit: Payer: Self-pay

## 2020-12-06 ENCOUNTER — Ambulatory Visit: Payer: Medicare HMO | Admitting: Dermatology

## 2020-12-06 DIAGNOSIS — C44722 Squamous cell carcinoma of skin of right lower limb, including hip: Secondary | ICD-10-CM | POA: Diagnosis not present

## 2020-12-06 DIAGNOSIS — D485 Neoplasm of uncertain behavior of skin: Secondary | ICD-10-CM

## 2020-12-06 DIAGNOSIS — L578 Other skin changes due to chronic exposure to nonionizing radiation: Secondary | ICD-10-CM | POA: Diagnosis not present

## 2020-12-06 DIAGNOSIS — L570 Actinic keratosis: Secondary | ICD-10-CM | POA: Diagnosis not present

## 2020-12-06 MED ORDER — MUPIROCIN 2 % EX OINT: TOPICAL_OINTMENT | CUTANEOUS | 0 refills | Status: DC

## 2020-12-06 NOTE — Progress Notes (Signed)
Follow-Up Visit   Subjective  Misty Ferguson is a 87 y.o. female who presents for the following: spot check (Pt has a spot on her right leg that her pcp believes is suspicious for [facility]. Pt reports that spot has been on her leg for several months and that it is sometimes painful. No hx of skin cancer or dysplastic nevi. ).  The following portions of the chart were reviewed this encounter and updated as appropriate:  Tobacco  Allergies  Meds  Problems  Med Hx  Surg Hx  Fam Hx      Review of Systems: No other skin or systemic complaints except as noted in HPI or Assessment and Plan.   Objective  Well appearing patient in no apparent distress; mood and affect are within normal limits.  A focused examination was performed including legs. Relevant physical exam findings are noted in the Assessment and Plan.  Objective  left posterior calf x 1, Right pretibia x 2 (3): Erythematous thin papules/macules with gritty scale.   Objective  Right pretibia superior: Right pretibia superior 0.7 cm firm thick pink papule       Objective  Right pretibia inferior: Right pretibia inferior 0.8 cm scaly pink papule     Assessment & Plan  AK (actinic keratosis) (3) left posterior calf x 1, Right pretibia x 2  Actinic keratoses are precancerous spots that appear secondary to cumulative UV radiation exposure/sun exposure over time. They are chronic with expected duration over 1 year. A portion of actinic keratoses will progress to squamous cell carcinoma of the skin. It is not possible to reliably predict which spots will progress to skin cancer and so treatment is recommended to prevent development of skin cancer.  Recommend daily broad spectrum sunscreen SPF 30+ to sun-exposed areas, reapply every 2 hours as needed.  Recommend staying in the shade or wearing long sleeves, sun glasses (UVA+UVB protection) and wide brim hats (4-inch brim around the entire circumference of the hat). Call  for new or changing lesions.   Prior to procedure, discussed risks of blister formation, small wound, skin dyspigmentation, or rare scar following cryotherapy.   Hypertrophic AK Right pretibia x 2  Destruction of lesion - left posterior calf x 1, Right pretibia x 2  Destruction method: cryotherapy   Informed consent: discussed and consent obtained   Lesion destroyed using liquid nitrogen: Yes   Outcome: patient tolerated procedure well with no complications   Post-procedure details: wound care instructions given    Neoplasm of uncertain behavior of skin (2) Right pretibia superior  Skin / nail biopsy Type of biopsy: tangential   Informed consent: discussed and consent obtained   Timeout: patient name, date of birth, surgical site, and procedure verified   Procedure prep:  Patient was prepped and draped in usual sterile fashion Prep type:  Isopropyl alcohol Anesthesia: the lesion was anesthetized in a standard fashion   Anesthetic:  1% lidocaine w/ epinephrine 1-100,000 buffered w/ 8.4% NaHCO3 Instrument used: flexible razor blade   Hemostasis achieved with: pressure, aluminum chloride and electrodesiccation   Outcome: patient tolerated procedure well   Post-procedure details: sterile dressing applied and wound care instructions given   Dressing type: bandage and petrolatum    Specimen 1 - Surgical pathology Differential Diagnosis: r/o SCC  Check Margins: No 0.7 cm firm thick pink papule   Right pretibia inferior  Skin / nail biopsy Type of biopsy: tangential   Informed consent: discussed and consent obtained   Timeout: patient  name, date of birth, surgical site, and procedure verified   Procedure prep:  Patient was prepped and draped in usual sterile fashion Prep type:  Isopropyl alcohol Anesthesia: the lesion was anesthetized in a standard fashion   Anesthetic:  1% lidocaine w/ epinephrine 1-100,000 buffered w/ 8.4% NaHCO3 Instrument used: flexible razor blade    Hemostasis achieved with: pressure, aluminum chloride and electrodesiccation   Outcome: patient tolerated procedure well   Post-procedure details: sterile dressing applied and wound care instructions given   Dressing type: bandage and petrolatum    Specimen 2 - Surgical pathology Differential Diagnosis: r/o SCC  Check Margins: No 0.8 cm scaly pink papule  Actinic Damage - chronic, secondary to cumulative UV radiation exposure/sun exposure over time - diffuse scaly erythematous macules with underlying dyspigmentation - Recommend daily broad spectrum sunscreen SPF 30+ to sun-exposed areas, reapply every 2 hours as needed.  - Recommend staying in the shade or wearing long sleeves, sun glasses (UVA+UVB protection) and wide brim hats (4-inch brim around the entire circumference of the hat). - Call for new or changing lesions.  Return in about 2 months (around 02/05/2021) for recheck AKs, TBSE.   I, Harriett Sine, CMA, am acting as scribe for Forest Gleason, MD.  Documentation: I have reviewed the above documentation for accuracy and completeness, and I agree with the above.  Forest Gleason, MD

## 2020-12-06 NOTE — Progress Notes (Deleted)
   Follow-Up Visit   Subjective  Misty Ferguson is a 87 y.o. female who presents for the following: spot check (Pt has a spot on her right leg that her pcp believes is suspicious for [facility]. Pt reports that spot has been on her leg for several months and that it is sometimes painful. No hx of skin cancer or dysplastic nevi. ).    The following portions of the chart were reviewed this encounter and updated as appropriate:      Review of Systems: No other skin or systemic complaints except as noted in HPI or Assessment and Plan.   Objective  Well appearing patient in no apparent distress; mood and affect are within normal limits.  {[id number]::"T full examination was performed including scalp, head, eyes, ears, nose, lips, neck, chest, axillae, abdomen, back, buttocks, bilateral upper extremities, bilateral lower extremities, hands, feet, fingers, toes, fingernails, and toenails. All findings within normal limits unless otherwise noted below."}   Assessment & Plan   No follow-ups on file.   I, Harriett Sine, CMA, am acting as scribe for Forest Gleason, MD.

## 2020-12-06 NOTE — Patient Instructions (Addendum)

## 2020-12-08 ENCOUNTER — Telehealth: Payer: Self-pay

## 2020-12-08 DIAGNOSIS — C4492 Squamous cell carcinoma of skin, unspecified: Secondary | ICD-10-CM

## 2020-12-08 DIAGNOSIS — L570 Actinic keratosis: Secondary | ICD-10-CM

## 2020-12-08 HISTORY — DX: Squamous cell carcinoma of skin, unspecified: C44.92

## 2020-12-08 HISTORY — DX: Actinic keratosis: L57.0

## 2020-12-08 NOTE — Telephone Encounter (Signed)
Patient scheduled for EDC and LN2 on 01/10/21. She has appt June 30th for FBSE and was wondering if she could do it at May appt. I told her I would check with you to see if time allows and then I will call her back, JS

## 2020-12-08 NOTE — Telephone Encounter (Signed)
-----   Message from Alfonso Patten, MD sent at 12/08/2020  1:46 PM EDT ----- 1. Skin , right pretibia superior WELL DIFFERENTIATED SQUAMOUS CELL CARCINOMA, BASE INVOLVED --> ED&C vs Mohs discussed; Pt prefers ED&C  2. Skin , right pretibia inferior HYPERTROPHIC ACTINIC KERATOSIS, BASE INVOLVED --> LN2  Dr. Jerilynn Mages discussed with pt 12/08/20.   MAs please call to schedule ED&C and LN2 in clinic. Thank you!

## 2020-12-12 NOTE — Telephone Encounter (Signed)
Left msg for patient advising she should keep appt for FBSE at end of June so we can also check EDC site from May 31st appt. JS

## 2020-12-12 NOTE — Telephone Encounter (Signed)
Dr. Mortimer Fries, please advise. Patient is currently on stiolto.

## 2020-12-13 DIAGNOSIS — J449 Chronic obstructive pulmonary disease, unspecified: Secondary | ICD-10-CM | POA: Diagnosis not present

## 2020-12-13 MED ORDER — BUDESONIDE-FORMOTEROL FUMARATE 160-4.5 MCG/ACT IN AERO: 2.0000 | INHALATION_SPRAY | Freq: Two times a day (BID) | RESPIRATORY_TRACT | 0 refills | Status: DC

## 2020-12-13 NOTE — Telephone Encounter (Signed)
Dr. Kasa, please advise. Thanks °

## 2020-12-20 ENCOUNTER — Telehealth: Payer: Self-pay

## 2020-12-20 NOTE — Chronic Care Management (AMB) (Addendum)
    Chronic Care Management Pharmacy Assistant   Name: Misty Ferguson  MRN: [id number] DOB: 06/27/1934   Reason for Encounter: Patient Assistance Application- Stiolto  Medications: Outpatient Encounter Medications as of 12/20/2020  Medication Sig   albuterol (PROVENTIL) (2.5 MG/3ML) 0.083% nebulizer solution INHALE THE CONTENTS OF 1 VIAL VIA NEBULIZER EVERY 6 HOURS AS NEEDED FOR WHEEZING OR FOR SHORTNESS OF BREATH   ascorbic acid (VITAMIN C) 1000 MG tablet Take 1,000 mg by mouth daily.    budesonide-formoterol (SYMBICORT) 160-4.5 MCG/ACT inhaler Inhale 2 puffs into the lungs in the morning and at bedtime.   Cholecalciferol (VITAMIN D) 125 MCG (5000 UT) CAPS Take 5,000 Units by mouth daily.   docusate sodium (COLACE) 100 MG capsule Take 100 mg by mouth 2 (two) times daily.   losartan (COZAAR) 25 MG tablet 25 mg daily.    Multiple Vitamins-Minerals (PRESERVISION AREDS) TABS Take 1 tablet by mouth 2 (two) times daily.   mupirocin ointment (BACTROBAN) 2 % Apply twice a day to biopsy sites.   Omega-3 Fatty Acids (FISH OIL) 1000 MG CAPS Take 1,000 mg by mouth 2 (two) times daily.    OXYGEN Inhale 4 L into the lungs as needed.   verapamil (VERELAN PM) 360 MG 24 hr capsule Take 360 mg by mouth daily.   Vitamin E 180 MG CAPS Take 180 mg by mouth daily.   No facility-administered encounter medications on file as of 12/20/2020.   Application for Stiolto has been completed on behalf of the patient prior to her 01/02/21 CCM appointment with [name].   Follow-Up:  Patient Assistance Coordination and Pharmacist Review  Margaretmary Dys, [place] Assistant [phone]  I have reviewed the care management and care coordination activities outlined in this encounter and I am certifying that I agree with the content of this note. No further action required.  [name], PharmD Clinical Pharmacist [facility] at [facility] [phone]

## 2020-12-26 ENCOUNTER — Telehealth: Payer: Self-pay

## 2020-12-26 NOTE — Chronic Care Management (AMB) (Addendum)
    Chronic Care Management Pharmacy Assistant   Name: Misty Ferguson  MRN: [id number] DOB: 01/29/1934  Reason for Encounter: Reminder for CCM Appointment 01/02/21   Medications: Outpatient Encounter Medications as of 12/26/2020  Medication Sig   albuterol (PROVENTIL) (2.5 MG/3ML) 0.083% nebulizer solution INHALE THE CONTENTS OF 1 VIAL VIA NEBULIZER EVERY 6 HOURS AS NEEDED FOR WHEEZING OR FOR SHORTNESS OF BREATH   ascorbic acid (VITAMIN C) 1000 MG tablet Take 1,000 mg by mouth daily.    budesonide-formoterol (SYMBICORT) 160-4.5 MCG/ACT inhaler Inhale 2 puffs into the lungs in the morning and at bedtime.   Cholecalciferol (VITAMIN D) 125 MCG (5000 UT) CAPS Take 5,000 Units by mouth daily.   docusate sodium (COLACE) 100 MG capsule Take 100 mg by mouth 2 (two) times daily.   losartan (COZAAR) 25 MG tablet 25 mg daily.    Multiple Vitamins-Minerals (PRESERVISION AREDS) TABS Take 1 tablet by mouth 2 (two) times daily.   mupirocin ointment (BACTROBAN) 2 % Apply twice a day to biopsy sites.   Omega-3 Fatty Acids (FISH OIL) 1000 MG CAPS Take 1,000 mg by mouth 2 (two) times daily.    OXYGEN Inhale 4 L into the lungs as needed.   verapamil (VERELAN PM) 360 MG 24 hr capsule Take 360 mg by mouth daily.   Vitamin E 180 MG CAPS Take 180 mg by mouth daily.   No facility-administered encounter medications on file as of 12/26/2020.   CALLIOPE DELANGEL was contacted to remind her of her upcoming telephone visit with [name] on 01/02/21 at 9:15 .  she was reminded to have all medications, supplements and any blood glucose and blood pressure readings available for review at appointment.   Are you having any problems with your medications? No problems with any medications  What concerns would you like to discuss with the pharmacist? No concerns at this time    Star Rating Drugs: Medication:  Last Fill: Day Supply Losartan 25  10/24/20 [place], CPP notified  Margaretmary Dys,  [place] [phone]  I have reviewed the care management and care coordination activities outlined in this encounter and I am certifying that I agree with the content of this note. No further action required.  [name], PharmD Clinical Pharmacist [facility] at [facility] [phone]

## 2021-01-02 ENCOUNTER — Ambulatory Visit (INDEPENDENT_AMBULATORY_CARE_PROVIDER_SITE_OTHER): Payer: Medicare HMO

## 2021-01-02 ENCOUNTER — Other Ambulatory Visit: Payer: Self-pay

## 2021-01-02 DIAGNOSIS — E7800 Pure hypercholesterolemia, unspecified: Secondary | ICD-10-CM

## 2021-01-02 DIAGNOSIS — I10 Essential (primary) hypertension: Secondary | ICD-10-CM | POA: Diagnosis not present

## 2021-01-02 NOTE — Progress Notes (Signed)
Encounter details: CCM Time Spent       Value Time User   Time spent with patient (minutes)  45 01/02/2021 10:16 AM [name], [place]   Time spent performing Chart review  30 01/02/2021 10:16 AM [name], [facility]   Total time (minutes)  75 01/02/2021 10:16 AM [name], RPH      Moderate to High Complex Decision Making       Value Time User   Moderate to High complex decision making  Yes 01/02/2021 10:16 AM [name], [facility]      CCM Services: This encounter meets complex CCM services and moderate to high decision making.  Prior to outreach and patient consent for Chronic Care Management, I referred this patient for services after reviewing the nominated patient list or from a personal encounter with the patient.  I have personally reviewed this encounter including the documentation in this note and have collaborated with the care management provider regarding care management and care coordination activities to include development and update of the comprehensive care plan. I am certifying that I agree with the content of this note and encounter as supervising physician.

## 2021-01-02 NOTE — Chronic Care Management (AMB) (Signed)
Patient assistance application for Symbicort has been completed on behalf of the patient 01/02/21. Patient prefers the application be mailed to her. Patient aware she will need to provide requested income verification to be sent the manufacturer. Application uploaded to Dr. [name] clinic to mail.    Follow-Up:  Patient Assistance Coordination and Pharmacist Review  [name], CPP notified  Margaretmary Dys, [place] Pharmacy Assistant [phone]

## 2021-01-02 NOTE — Patient Instructions (Addendum)
Dear Misty Ferguson,  Below is a summary of the goals we discussed during our follow up appointment on Jan 02, 2021. Please contact me anytime with questions or concerns.   Visit Information  Patient Care Plan: CCM Pharmacy Care Plan    Problem Identified: CHL AMB "PATIENT-SPECIFIC PROBLEM"     Long-Range Goal: Disease Management   Start Date: 01/02/2021  Priority: High  Note:    Current Barriers:  . Unable to independently afford treatment regimen - Symbicort   Pharmacist Clinical Goal(s):  Marland Kitchen Patient will contact provider office for questions/concerns as evidenced notation of same in electronic health record through collaboration with PharmD and provider.   Interventions: . 1:1 collaboration with [name], MD regarding development and update of comprehensive plan of care as evidenced by provider attestation and co-signature . Inter-disciplinary care team collaboration (see longitudinal plan of care) . Comprehensive medication review performed; medication list updated in electronic medical record  Hypertension (BP goal <140/90) -Controlled - home blood pressure within goal -History of tachycardia, medications managed by Dr. Ubaldo Glassing -Current treatment:  Losartan 25 mg - 1 tablet daily  Verapamil 360 mg - 1 tablet daily  -Medications previously tried: decreased losartan 50 mg to 25 mg due to hyperkalemia, potassium WNL Patient checks BP at home with an automatic arm cuff  Patient home blood pressure log and vitals: 12/31/20 - 126/71 01/01/21 - 116/72  Temp 97.7  Weight 97 lbs (stable) -Currrent dietary habits: She skips breakfast, eats lunch around 10:30, dinner at 7 PM. Does not snack. She eats fresh vegetables and limits sugar and fried foods. She is aware of low weight and actively trying to gain weight. -Current exercise habits: She cannot walk outside due to SOB/COPD, she does chair exercises, hand weights, walks around the house. If her oxygen runs low < 95% - she rests  until it comes back up (mid-90s) -Denies hypotensive/hypertensive symptoms -Reviewed BP goals and benefits of medications for prevention of heart attack, stroke and kidney damage; Importance of home blood pressure monitoring; -Recommend monitor BP at home at least once monthly to ensure BP stays within goal and no hypotension, document, and provide log at future appointments -Recommended to continue current medication  Hyperlipidemia: (LDL goal < 100) -Controlled - LDL 102 (just above goal of < 100, but good control) -Current treatment:  Omega 3 fatty acids 1000 mg - 1 twice daily -Medications previously tried: none   -Recommended to continue current medication and weight bearing exercise as able.  COPD (Goal: control symptoms and prevent exacerbations) -Not ideally controlled  Followed by pulmonology, Dr. Mortimer Fries -Reports Symbicort will replace Stiolto. She will switch this week once she finishes the [employer]. She is hopeful Symbicort will be more effective.  -Current treatment   Symbicort 160-4.5 mcg/act - 2 puffs twice daily   Albuterol nebulizer - every 6 hours as needed  Supplemental Oxygen - 24/7 -Medications previously tried: Mining engineer, Advair, Spiriva -Last spirometry score: 01/2012 FEV1 46% -Gold Grade: Gold 3 (FEV1 30-49%) -Exacerbations requiring treatment in last 6 months: none  -Patient confirms consistent use of maintenance inhaler -Frequency of rescue inhaler use: she used the nebulizer about once in past week, she uses up to three times daily on bad days, prior reported albuterol inhalers are ineffective. Nebulizer only mildly effective.  -Recommended to continue current medication Assessed patient finances. apply for assistance Symbicort instead of Stiolto - mail application to home.  Other - gas/burping -Not ideally controlled -Current treatment  . Tums as needed  Ms. Lemmons reports gas under rip cage that feels tight making it difficult to breath. Relieved with  belching. She occasionally takes Tums. She has these symptoms regularly, sometimes daily - took tums this morning. Denies any nighttime symptoms. -Medications previously tried: none -Recommended trial over the counter Prilosec or Nexium (generic or brand is fine) 1 daily 30 minutes prior to breakfast for 2 weeks. If unresolved, please call office for further evaluation.  Patient Goals/Self-Care Activities . Patient will:  - check blood pressure at least once motnhly, document, and provide at future appointments  Follow Up Plan: Telephone follow up appointment with care management team member scheduled for: 12 months  Medication Assistance: Start application for Symbicort assistance. Mail to patient's home. She will sign and provide copy of family tax statement or other proof of income and return to office for faxing.       Patient verbalizes understanding of instructions provided today and agrees to view in [place].    Telephone follow up appointment with pharmacy team member scheduled for: 56 months  [name], PharmD Clinical Pharmacist [facility] at [facility] [phone]   How to Use a Nebulizer, Adult  A nebulizer is a device that turns liquid medicine into a mist or vapor that you can breathe in (inhale). This medicine helps to open the air passages in your lungs. You may need to use a nebulizer if you have an acute breathing illness, such as pneumonia. A nebulizer may also be used to treat chronic conditions, such as asthma or chronic obstructive pulmonarydisease (COPD). There are different kinds of nebulizers. With some nebulizers, you breathe in medicine through a mouthpiece. With others, you get medicine through a mask that fits over your nose and mouth. What are the risks? If you use a nebulizer that does not fit right or is not cleaned properly, it can cause some problems, including:  Infection.  Eye irritation.  Delivery of too much medicine or not  enough medicine.  Mouth irritation. Supplies needed:  Air compressor (nebulizer machine).  Nebulizer medicine cup (reservoir)and tubing.  Mouthpiece or face mask.  Soap and water.  Sterile or distilled water.  Clean towel. How to use a nebulizer Preparing a nebulizer Take these steps before using your nebulizer: 1. Read the manufacturer's instructions for your nebulizer, as machines vary. 2. Check your medicine. Make sure it has not expired and is not damaged in any way. 3. Wash your hands with soap and water. 4. Put all of the parts of your nebulizer on a sturdy, flat surface. 5. Connect the tubing to the nebulizer machine and to the reservoir. 6. Measure the liquid medicine according to instructions from your health care provider. Pour the liquid into the reservoir. 7. Attach the mouthpiece or mask. 8. Test the nebulizer by turning it on to make sure that a spray comes out. Then, turn it off. Using a nebulizer Be sure to stop the machine at any time if you start coughing or if the medicine foams or bubbles. 1. Sit in an upright, relaxed position. 2. If your nebulizer has a mask, put it over your nose and mouth. It should fit somewhat snugly, with no gaps around the nose or cheeks where medicine could escape. If you use a mouthpiece, put it in your mouth. Press your lips firmly around the mouthpiece. 3. Turn on the nebulizer. 4. Some nebulizers have a finger valve. If yours does, cover up the air hole so the air gets to the nebulizer.  5. Once the medicine begins to mist out, take slow, deep breaths. If there is a finger valve, release it at the end of your breath. 6. Continue taking slow, deep breaths until the medicine in the nebulizer is gone and no mist appears.      Cleaning a nebulizer The nebulizer and all of its parts must be kept very clean. If the nebulizer and its parts are not cleaned properly, bacteria can grow inside of them. If you inhale the bacteria, you can get  sick. Follow the manufacturer's instructions for cleaning your nebulizer. For most nebulizers, you should follow these guidelines:  Clean the mouthpiece or mask and the reservoir by: ? Rinsing them after each use. Use sterile or distilled water. ? Washing them 1-2 times a week using soap and warm water.  Do not wash the tubing.  After you rinse or wash them, place the parts on a clean towel and let them air-dry completely. After they dry, reconnect the pieces and turn the nebulizer on without any medicine in it. Doing this will blow air through the equipment to help dry it out.  Store the nebulizer in a clean and dust-free place.  Check the filter at least one time every week. Replace the filter if it looks dirty. Follow these instructions at home  Use your nebulizer only as told by your health care provider. Do not use the nebulizer more than directed by your health care provider.  Do not use any products that contain nicotine or tobacco, such as cigarettes, e-cigarettes, and chewing tobacco. If you need help quitting, ask your health care provider.  Keep all follow-up visits as told by your health care provider. This is important. Where to find more information  Allergy & Asthma Network: allergyasthmanetwork.org  American Lung Association: www.lung.org Contact a health care provider if:  You have trouble using the nebulizer.  Your nebulizer foams or stops working.  Your nebulizer does not create a mist after you add medicine and turn it on. Get help right away if:  You continue to have trouble breathing.  Your breathing gets worse during a nebulizer treatment. These symptoms may represent a serious problem that is an emergency. Do not wait to see if the symptoms will go away. Get medical help right away. Call your local emergency services (911 in the [place] Do not drive yourself to the hospital. Summary  A nebulizer is a device that turns liquid medicine into a mist (vapor)  that you can breathe in (inhale).  Measure the liquid medicine according to instructions from your health care provider. Pour the liquid into the part of the nebulizer that holds the medicine (reservoir).  Once the medicine begins to mist out, take slow, deep breaths.  Rinse or wash the mouthpiece or mask and the reservoir after each use, and allow them to air-dry completely. This information is not intended to replace advice given to you by your health care provider. Make sure you discuss any questions you have with your health care provider. Document Revised: 04/11/2020 Document Reviewed: 09/09/2019 Elsevier Patient Education  2021 [employer].

## 2021-01-02 NOTE — Progress Notes (Signed)
Chronic Care Management Pharmacy Note  01/02/2021 Name:  Misty Ferguson MRN:  [id number] DOB:  23-Mar-1934  Subjective: Misty Ferguson is an 87 y.o. year old female who is a primary patient of [name], Amy E, MD.  The CCM team was consulted for assistance with disease management and care coordination needs.    Engaged with patient by telephone for follow up visit in response to provider referral for pharmacy case management and/or care coordination services. Her primary health concern is shortness of breath. She is about to change from Stiolto to Symbicort to see if this helps. She wanted to finish out the [employer] before she switched. She would like assistance with inhaler cost. She also has a procedure for skin cancer removal next month.  Consent to Services:  The patient was given information about Chronic Care Management services, agreed to services, and gave verbal consent prior to initiation of services.  Please see initial visit note for detailed documentation.   Patient Care Team: [name], MD as PCP - General Flora Lipps, MD as Consulting Physician (Pulmonary Disease) Birder Robson, MD as Referring Physician (Ophthalmology) Ubaldo Glassing Javier Docker, MD as Consulting Physician (Cardiology) [name], [facility] as Pharmacist (Pharmacist)  Recent office visits:  11/08/20- Dr. Eliezer Lofts, PCP - Referral to dermatology for 3 lesions right lower leg. Follow up 1 year.  Recent consult visits:  12/13/20 - Pulmonology telephone note - Start Symbicort to replace Stiolto 12/06/20 - Dermatology - Skin biopsy 10/19/20- Dr. Nickolas Madrid, Urology - Ordered Renal/Bladder US in 6 months. 09/15/20- Dr. Bartholome Bill, Cardiology - Tachycardia, continue verapamil and losartan. No medication changes 07/20/20- Telephone encounter, Pulmonology - discontinued Brovana and Pulmicort - not covered by insurance. Patient will continue Stiolto only. 07/15/20- Dr. Flora Lipps, Pulmonology - Started Brovana 15  mcg/15m, Started short course of Z-Pak, Started Pulmicort 0.540m2mL, start Prednisone 20 mg for 10 days.   Hospital visits:  None in previous 6 months  Objective:  Lab Results  Component Value Date   CREATININE 0.92 11/01/2020   BUN 23 11/01/2020   GFR 57.21 (L) 11/01/2020   GFRNONAA 54 (L) 06/10/2019   GFRAA 62 06/10/2019   NA 139 11/01/2020   K 4.9 11/01/2020   CALCIUM 9.5 11/01/2020   CO2 31 11/01/2020   GLUCOSE 117 (H) 11/01/2020    Lab Results  Component Value Date/Time   HGBA1C 5.6 11/01/2020 08:02 AM   HGBA1C 5.2 10/22/2019 07:37 AM   GFR 57.21 (L) 11/01/2020 08:02 AM   GFR 67.46 10/30/2019 10:06 AM   MICROALBUR <0.7 09/14/2014 09:08 AM   MICROALBUR 2.1 (H) 04/21/2010 09:34 AM      Lab Results  Component Value Date   CHOL 238 (H) 11/01/2020   HDL 124.90 11/01/2020   LDLCALC 102 (H) 11/01/2020   LDLDIRECT 99.1 09/08/2013   TRIG 56.0 11/01/2020   CHOLHDL 2 11/01/2020    Hepatic Function Latest Ref Rng & Units 11/01/2020 10/22/2019 06/10/2019  Total Protein 6.0 - 8.3 g/dL 6.1 6.3 6.6  Albumin 3.5 - 5.2 g/dL 4.4 4.0 4.5  AST 0 - 37 U/L _0 ALT 0 - 35 U/L _1 Alk Phosphatase 39 - 117 U/L 51 82 88  Total Bilirubin 0.2 - 1.2 mg/dL 0.4 0.5 0.4  Bilirubin, Direct 0.0 - 0.3 mg/dL - - -    Lab Results  Component Value Date/Time   TSH 3.109 10/23/2017 05:11 AM    CBC Latest Ref Rng & Units 04/30/2019  10/22/2017 09/08/2017  WBC 3.4 - 10.8 x10E3/uL 6.5 6.2 7.3  Hemoglobin 11.1 - 15.9 g/dL 12.4 12.5 14.2  Hematocrit 34.0 - 46.6 % 37.1 37.5 41.5  Platelets 150 - 450 x10E3/uL 295 214 232    Lab Results  Component Value Date/Time   VD25OH 33.32 10/17/2018 08:13 AM   VD25OH 41.63 10/04/2017 09:16 AM    Clinical ASCVD: No  The ASCVD Risk score Mikey Bussing DC Jr., et al., 2013) failed to calculate for the following reasons:   The 2013 ASCVD risk score is only valid for ages 42 to 57    Depression screen PHQ 2/9 11/01/2020 10/28/2019 11/05/2017  Decreased  Interest 0 0 0  Down, Depressed, Hopeless 0 0 1  PHQ - 2 Score 0 0 1  Altered sleeping 0 0 -  Tired, decreased energy 0 0 -  Change in appetite 0 0 -  Feeling bad or failure about yourself  0 0 -  Trouble concentrating 0 0 -  Moving slowly or fidgety/restless 0 0 -  Suicidal thoughts 0 0 -  PHQ-9 Score 0 0 -  Difficult doing work/chores Not difficult at all Not difficult at all -  Some recent data might be hidden    Social History   Tobacco Use  Smoking Status Former Smoker  . Packs/day: 1.30  . Years: 45.00  . Pack years: 58.50  . Types: Cigarettes  . Quit date: 08/14/2003  . Years since quitting: 17.4  Smokeless Tobacco Never Used   BP Readings from Last 3 Encounters:  11/08/20 140/72  11/01/20 (!) 146/80  10/19/20 (!) 162/82   Pulse Readings from Last 3 Encounters:  11/08/20 88  10/19/20 89  07/15/20 77   Wt Readings from Last 3 Encounters:  11/08/20 99 lb (44.9 kg)  11/01/20 98 lb (44.5 kg)  07/15/20 99 lb (44.9 kg)   BMI Readings from Last 3 Encounters:  11/08/20 18.71 kg/m  11/01/20 18.52 kg/m  10/19/20 18.71 kg/m    Assessment/Interventions: Review of patient past medical history, allergies, medications, health status, including review of consultants reports, laboratory and other test data, was performed as part of comprehensive evaluation and provision of chronic care management services.   SDOH:  (Social Determinants of Health) assessments and interventions performed: Yes SDOH Interventions   Flowsheet Row Most Recent Value  SDOH Interventions   Financial Strain Interventions Other (Comment)  [Apply for Symbicort assistance]     SDOH Screenings   Alcohol Screen: Low Risk   . Last Alcohol Screening Score (AUDIT): 4  Depression (PHQ2-9): Low Risk   . PHQ-2 Score: 0  Financial Resource Strain: High Risk  . Difficulty of Paying Living Expenses: Hard  Food Insecurity: No Food Insecurity  . Worried About Charity fundraiser in the Last Year: Never  true  . Ran Out of Food in the Last Year: Never true  Housing: Low Risk   . Last Housing Risk Score: 0  Physical Activity: Inactive  . Days of Exercise per Week: 0 days  . Minutes of Exercise per Session: 0 min  Social Connections: Not on file  Stress: No Stress Concern Present  . Feeling of Stress : Not at all  Tobacco Use: Medium Risk  . Smoking Tobacco Use: Former Smoker  . Smokeless Tobacco Use: Never Used  Transportation Needs: No Transportation Needs  . Lack of Transportation (Medical): No  . Lack of Transportation (Non-Medical): No    CCM Care Plan  No Known Allergies  Medications Reviewed Today  Reviewed by [name], [facility] (Pharmacist) on 01/02/21 at [place] List Status: <None>  Medication Order Taking? Sig Documenting Provider Last Dose Status Informant  albuterol (PROVENTIL) (2.5 MG/3ML) 0.083% nebulizer solution [id number] Yes INHALE THE CONTENTS OF 1 VIAL VIA NEBULIZER EVERY 6 HOURS AS NEEDED FOR WHEEZING OR FOR SHORTNESS OF Cephus Richer, MD Taking Active   ascorbic acid (VITAMIN C) 1000 MG tablet [id number] Yes Take 1,000 mg by mouth daily.  [provider] Taking Active Self  budesonide-formoterol (SYMBICORT) 160-4.5 MCG/ACT inhaler [id number] Yes Inhale 2 puffs into the lungs in the morning and at bedtime. Flora Lipps, MD Taking Active   Cholecalciferol (VITAMIN D) 125 MCG (5000 UT) CAPS [id number] Yes Take 5,000 Units by mouth daily. [provider] Taking Active Self  docusate sodium (COLACE) 100 MG capsule [id number] Yes Take 100 mg by mouth 2 (two) times daily. [provider] Taking Active Self  losartan (COZAAR) 25 MG tablet [id number] Yes 25 mg daily.  [provider] Taking Active   Multiple Vitamins-Minerals (PRESERVISION AREDS) TABS [id number] Yes Take 1 tablet by mouth 2 (two) times daily. [provider] Taking Active Self  mupirocin ointment (BACTROBAN) 2 % [id number] Yes Apply twice a day to biopsy  sites. Moye, [place], MD Taking Active   Omega-3 Fatty Acids (FISH OIL) 1000 MG CAPS [id number] Yes Take 1,000 mg by mouth 2 (two) times daily.  [provider] Taking Active Self  OXYGEN [id number] Yes Inhale 4 L into the lungs as needed. [provider] Taking Active   verapamil (VERELAN PM) 360 MG 24 hr capsule [id number] Yes Take 360 mg by mouth daily. [provider] Taking Active   Vitamin E 180 MG CAPS [id number] Yes Take 180 mg by mouth daily. [provider] Taking Active Self          Patient Active Problem List   Diagnosis Date Noted  . Skin lesions 11/08/2020  . Goals of care, counseling/discussion 06/14/2019  . History of bladder cancer 06/08/2019  . Constipation 04/25/2019  . Sinus tachycardia 10/24/2018  . Senile purpura ([place]) 10/04/2016  . Peripheral neuropathy 07/24/2016  . Chronic hypoxemic respiratory failure ([place]) 08/25/2012  . Hyperkalemia 05/04/2009  . HYPERCHOLESTEROLEMIA 03/24/2008  . Prediabetes 12/11/2006  . MACULAR DEGENERATION 12/10/2006  . Essential hypertension, benign 12/10/2006  . Centrilobular emphysema ([place]) 12/10/2006  . Osteoporosis 12/10/2006    Immunization History  Administered Date(s) Administered  . Fluad Quad(high Dose 65+) 04/09/2019, 04/27/2020  . H1N1 10/04/2008  . Influenza Split 04/21/2011, 05/12/2012  . Influenza Whole 05/27/2007, 05/08/2008, 05/04/2009, 04/26/2010  . Influenza,inj,Quad PF,6+ Mos 05/06/2013, 05/06/2014, 04/21/2015, 05/11/2016, 04/17/2017, 05/01/2018  . Pneumococcal Conjugate-13 09/21/2014  . Pneumococcal Polysaccharide-23 03/13/2006, 06/28/2011  . Td 03/13/2006  . Tdap 10/04/2016  . Zoster 09/05/2010    Conditions to be addressed/monitored:  Hypertension, Hyperlipidemia and COPD  Care Plan : [place]  Updates made by [name], [facility] since 01/02/2021 12:00 AM    Problem: CHL AMB "PATIENT-SPECIFIC PROBLEM"     Long-Range Goal: Disease Management   Start  Date: 01/02/2021  Priority: High  Note:    Current Barriers:  . Unable to independently afford treatment regimen - Symbicort   Pharmacist Clinical Goal(s):  Marland Kitchen Patient will contact provider office for questions/concerns as evidenced notation of same in electronic health record through collaboration with PharmD and provider.   Interventions: . 1:1 collaboration with [name], MD regarding development and update of comprehensive plan of care as evidenced  by provider attestation and co-signature . Inter-disciplinary care team collaboration (see longitudinal plan of care) . Comprehensive medication review performed; medication list updated in electronic medical record  Hypertension (BP goal <140/90) -Controlled - home blood pressure within goal -History of tachycardia, medications managed by Dr. Ubaldo Glassing -Current treatment:  Losartan 25 mg - 1 tablet daily  Verapamil 360 mg - 1 tablet daily  -Medications previously tried: decreased losartan 50 mg to 25 mg due to hyperkalemia, potassium WNL Patient checks BP at home with an automatic arm cuff  Patient home blood pressure log and vitals: 12/31/20 - 126/71 01/01/21 - 116/72  Temp 97.7  Weight 97 lbs (stable) -Currrent dietary habits: She skips breakfast, eats lunch around 10:30, dinner at 7 PM. Does not snack. She eats fresh vegetables and limits sugar and fried foods. She is aware of low weight and actively trying to gain weight. -Current exercise habits: She cannot walk outside due to SOB/COPD, she does chair exercises, hand weights, walks around the house. If her oxygen runs low < 95% - she rests until it comes back up (mid-90s) -Denies hypotensive/hypertensive symptoms -Reviewed BP goals and benefits of medications for prevention of heart attack, stroke and kidney damage; Importance of home blood pressure monitoring; -Recommend monitor BP at home at least once monthly to ensure BP stays within goal and no hypotension, document, and  provide log at future appointments -Recommended to continue current medication  Hyperlipidemia: (LDL goal < 100) -Controlled - LDL 102 (just above goal of < 100, but good control) -Current treatment:  Omega 3 fatty acids 1000 mg - 1 twice daily -Medications previously tried: none   -Recommended to continue current medication and weight bearing exercise as able.  COPD (Goal: control symptoms and prevent exacerbations) -Not ideally controlled  Followed by pulmonology, Dr. Mortimer Fries -Reports Symbicort will replace Stiolto. She will switch this week once she finishes the [employer]. She is hopeful Symbicort will be more effective.  -Current treatment   Symbicort 160-4.5 mcg/act - 2 puffs twice daily   Albuterol nebulizer - every 6 hours as needed  Supplemental Oxygen - 24/7 -Medications previously tried: Mining engineer, Advair, Spiriva -Last spirometry score: 01/2012 FEV1 46% -Gold Grade: Gold 3 (FEV1 30-49%) -Exacerbations requiring treatment in last 6 months: none  -Patient confirms consistent use of maintenance inhaler -Frequency of rescue inhaler use: she used the nebulizer about once in past week, she uses up to three times daily on bad days, prior reported albuterol inhalers are ineffective. Nebulizer only mildly effective.  -Recommended to continue current medication Assessed patient finances. apply for assistance Symbicort instead of Stiolto - mail application to home.  Other - gas/burping -Not ideally controlled -Current treatment  . Tums as needed Ms. Stene reports gas under rip cage that feels tight making it difficult to breath. Relieved with belching. She occasionally takes Tums. She has these symptoms regularly, sometimes daily - took tums this morning. Denies any nighttime symptoms. -Medications previously tried: none -Recommended trial over the counter Prilosec or Nexium (generic or brand is fine) 1 daily 30 minutes prior to breakfast for 2 weeks. If unresolved, please call office  for further evaluation.  Patient Goals/Self-Care Activities . Patient will:  - check blood pressure at least once motnhly, document, and provide at future appointments  Follow Up Plan: Telephone follow up appointment with care management team member scheduled for: 12 months  Medication Assistance: Start application for Symbicort assistance. Mail to patient's home. She will sign and provide copy of family tax statement or  other proof of income and return to office for faxing.      Patient's preferred pharmacy is:  [id number] [place], [place]Phone: [phone]: [phone] [facility] [place]Phone: [phone]: [phone] Uses pill box? Yes Pt endorses 100% compliance  We discussed: Benefits of medication synchronization, packaging and delivery as well as enhanced pharmacist oversight with [employer]. Patient decided to: Continue current medication management strategy  Care Plan and Follow Up Patient Decision:  Patient agrees to Care Plan and Follow-up.  MDebbora Dus PharmD Clinical Pharmacist [facility] [phone]

## 2021-01-10 ENCOUNTER — Telehealth: Payer: Self-pay | Admitting: Family Medicine

## 2021-01-10 ENCOUNTER — Ambulatory Visit: Payer: Medicare HMO | Admitting: Dermatology

## 2021-01-10 ENCOUNTER — Other Ambulatory Visit: Payer: Self-pay

## 2021-01-10 ENCOUNTER — Encounter: Payer: Self-pay | Admitting: Dermatology

## 2021-01-10 DIAGNOSIS — C44722 Squamous cell carcinoma of skin of right lower limb, including hip: Secondary | ICD-10-CM | POA: Diagnosis not present

## 2021-01-10 DIAGNOSIS — L570 Actinic keratosis: Secondary | ICD-10-CM | POA: Diagnosis not present

## 2021-01-10 DIAGNOSIS — C4492 Squamous cell carcinoma of skin, unspecified: Secondary | ICD-10-CM

## 2021-01-10 MED ORDER — MUPIROCIN 2 % EX OINT: 1.0000 "application " | TOPICAL_OINTMENT | Freq: Every day | CUTANEOUS | 1 refills | Status: DC

## 2021-01-10 NOTE — Progress Notes (Signed)
Follow-Up Visit   Subjective  Misty Ferguson is a 87 y.o. female who presents for the following: Follow-up (Patient is here today for [facility] and to treat some aks. ).  The following portions of the chart were reviewed this encounter and updated as appropriate:  Tobacco  Allergies  Meds  Problems  Med Hx  Surg Hx  Fam Hx        Objective  Well appearing patient in no apparent distress; mood and affect are within normal limits.  A focused examination was performed including right pretibia . Relevant physical exam findings are noted in the Assessment and Plan.  Objective  Right Lower Leg - Anterior: Pink plaque  Objective  right lateral pretibia x 1 left posterior calf x 1 (2): Erythematous thin papules/macules with gritty scale.   Assessment & Plan  Squamous cell carcinoma of skin Right Lower Leg - Anterior  Destruction of lesion  Destruction method: electrodesiccation and curettage   Informed consent: discussed and consent obtained   Timeout:  patient name, date of birth, surgical site, and procedure verified Patient was prepped and draped in usual sterile fashion: area prepped with isopropyl alcohol. Anesthesia: the lesion was anesthetized in a standard fashion   Anesthetic:  1% lidocaine w/ epinephrine 1-100,000 buffered w/ 8.4% NaHCO3 Curettage performed in three different directions: Yes   Electrodesiccation performed over the curetted area: Yes   Curettage cycles:  2 Final wound size (cm):  1.9 Hemostasis achieved with:  electrodesiccation Outcome: patient tolerated procedure well with no complications   Post-procedure details: wound care instructions given   Additional details:  Mupirocin and a pressure dressing applied  mupirocin ointment (BACTROBAN) 2 %  Biopsy ruled scc base involved   Patient elected ED& C for treatment rather than Mohs.   Patient has elected to use Silver sulfadiazine cream to wound for healing (she has at home and has used with  good effect in the past). She also was prescribed mupirocin and recommended to use both.  Discussed that leg wounds normally take many weeks to heal. She will seek care if she develops pain, spreading redness, fevers or chills, or failure of the wound to continue getting smaller.   Only 2 thorough curettage cycles performed as given this thin skinned area concern for breaking through into fat and impairing healing. Discussed with patient and she prefers to stop at 2 with a nearly as high cure rate rather than perform a third cycle and potentially have a much slower healing wound.   Actinic keratosis (2) right lateral pretibia x 1 left posterior calf x 1  Hypertrophic ak at right lateral pretibia; If not resolved at recheck next month, consider biopsy. Call for any changes  Actinic keratoses are precancerous spots that appear secondary to cumulative UV radiation exposure/sun exposure over time. They are chronic with expected duration over 1 year. A portion of actinic keratoses will progress to squamous cell carcinoma of the skin. It is not possible to reliably predict which spots will progress to skin cancer and so treatment is recommended to prevent development of skin cancer.  Recommend daily broad spectrum sunscreen SPF 30+ to sun-exposed areas, reapply every 2 hours as needed.  Recommend staying in the shade or wearing long sleeves, sun glasses (UVA+UVB protection) and wide brim hats (4-inch brim around the entire circumference of the hat). Call for new or changing lesions.   Destruction of lesion - right lateral pretibia x 1 left posterior calf x 1  Destruction method:  cryotherapy   Informed consent: discussed and consent obtained   Lesion destroyed using liquid nitrogen: Yes   Cryotherapy cycles:  2 Outcome: patient tolerated procedure well with no complications   Post-procedure details: wound care instructions given    Return for keep follow up as schedule .  I, Ruthell Rummage, CMA,  am acting as scribe for Forest Gleason, MD.  Documentation: I have reviewed the above documentation for accuracy and completeness, and I agree with the above.  Forest Gleason, MD

## 2021-01-10 NOTE — Telephone Encounter (Signed)
Pt's husband dropped ff patient assistance paper work on 01/10/2021 at 1:20pm.put in folder at front desk.

## 2021-01-10 NOTE — Patient Instructions (Addendum)
Electrodesiccation and Curettage ("Scrape and Burn") Wound Care Instructions  1. Leave the original bandage on for 24 hours if possible.  If the bandage becomes soaked or soiled before that time, it is OK to remove it and examine the wound.  A small amount of post-operative bleeding is normal.  If excessive bleeding occurs, remove the bandage, place gauze over the site and apply continuous pressure (no peeking) over the area for 30 minutes. If this does not work, please call our clinic as soon as possible or page your doctor if it is after hours.   2. Once a day, cleanse the wound with soap and water. It is fine to shower. If a thick crust develops you may use a Q-tip dipped into dilute hydrogen peroxide (mix 1:1 with water) to dissolve it.  Hydrogen peroxide can slow the healing process, so use it only as needed.    3. After washing, apply petroleum jelly (Vaseline) or an antibiotic ointment if your doctor prescribed one for you, followed by a bandage.    4. For best healing, the wound should be covered with a layer of ointment at all times. If you are not able to keep the area covered with a bandage to hold the ointment in place, this may mean re-applying the ointment several times a day.  Continue this wound care until the wound has healed and is no longer open. It may take several weeks for the wound to heal and close.  Itching and mild discomfort is normal during the healing process.  If you have any discomfort, you can take Tylenol (acetaminophen) or ibuprofen as directed on the bottle. (Please do not take these if you have an allergy to them or cannot take them for another reason).  Some redness, tenderness and white or yellow material in the wound is normal healing.  If the area becomes very sore and red, or develops a thick yellow-green material (pus), it may be infected; please notify us.    Wound healing continues for up to one year following surgery. It is not unusual to experience pain  in the scar from time to time during the interval.  If the pain becomes severe or the scar thickens, you should notify the office.    A slight amount of redness in a scar is expected for the first six months.  After six months, the redness will fade and the scar will soften and fade.  The color difference becomes less noticeable with time.  If there are any problems, return for a post-op surgery check at your earliest convenience.  To improve the appearance of the scar, you can use silicone scar gel, cream, or sheets (such as Mederma or Serica) every night for up to one year. These are available over the counter (without a prescription).  Please call our office at [phone] for any questions or concerns. If you have any questions or concerns for your doctor, please call our main line at [phone] and press option 4 to reach your doctor's medical assistant. If no one answers, please leave a voicemail as directed and we will return your call as soon as possible. Messages left after 4 pm will be answered the following business day.   You may also send [place] a message via [place]. We typically respond to MyChart messages within 1-2 business days.  For prescription refills, please ask your pharmacy to contact our office. Our fax number is [phone].  If you have an urgent issue when the clinic  is closed that cannot wait until the next business day, you can page your doctor at the number below.    Please note that while we do our best to be available for urgent issues outside of office hours, we are not available 24/7.   If you have an urgent issue and are unable to reach [place], you may choose to seek medical care at your doctor's office, retail clinic, urgent care center, or emergency room.  If you have a medical emergency, please immediately call 911 or go to the emergency department.  Pager Numbers  - Dr. Nehemiah Massed: [phone]  - Dr. Laurence Ferrari: [phone]  - Dr. Nicole Kindred: [phone]  In  the event of inclement weather, please call our main line at [phone] for an update on the status of any delays or closures.  Dermatology Medication Tips: Please keep the boxes that topical medications come in in order to help keep track of the instructions about where and how to use these. Pharmacies typically print the medication instructions only on the boxes and not directly on the medication tubes.   If your medication is too expensive, please contact our office at [phone] option 4 or send [place] a message through [place].   We are unable to tell what your co-pay for medications will be in advance as this is different depending on your insurance coverage. However, we may be able to find a substitute medication at lower cost or fill out paperwork to get insurance to cover a needed medication.   If a prior authorization is required to get your medication covered by your insurance company, please allow [place] 1-2 business days to complete this process.  Drug prices often vary depending on where the prescription is filled and some pharmacies may offer cheaper prices.  The website www.goodrx.com contains coupons for medications through different pharmacies. The prices here do not account for what the cost may be with help from insurance (it may be cheaper with your insurance), but the website can give you the price if you did not use any insurance.  - You can print the associated coupon and take it with your prescription to the pharmacy.  - You may also stop by our office during regular business hours and pick up a GoodRx coupon card.  - If you need your prescription sent electronically to a different pharmacy, notify our office through [facility] or by phone at [phone] option 4.

## 2021-01-17 ENCOUNTER — Telehealth: Payer: Self-pay

## 2021-01-17 NOTE — Telephone Encounter (Signed)
Application has been received and placed in Dr. Zoila Shutter folder for signature.

## 2021-01-17 NOTE — Telephone Encounter (Signed)
Symbicort assistance application reviewed and faxed to Dr. Mortimer Fries (fax: [phone]) for MD signature. Requested Dr. Zoila Shutter office fax application application to [employer] once signed.

## 2021-01-20 NOTE — Telephone Encounter (Signed)
Dr. Kasa, please advise. Thanks °

## 2021-01-31 NOTE — Telephone Encounter (Signed)
Application has been faxed to [employer]. Received successful fax confirmation.

## 2021-02-09 ENCOUNTER — Ambulatory Visit: Payer: Medicare HMO | Admitting: Dermatology

## 2021-02-09 ENCOUNTER — Other Ambulatory Visit: Payer: Self-pay

## 2021-02-09 DIAGNOSIS — L578 Other skin changes due to chronic exposure to nonionizing radiation: Secondary | ICD-10-CM | POA: Diagnosis not present

## 2021-02-09 DIAGNOSIS — L821 Other seborrheic keratosis: Secondary | ICD-10-CM | POA: Diagnosis not present

## 2021-02-09 DIAGNOSIS — L814 Other melanin hyperpigmentation: Secondary | ICD-10-CM | POA: Diagnosis not present

## 2021-02-09 DIAGNOSIS — Z85828 Personal history of other malignant neoplasm of skin: Secondary | ICD-10-CM

## 2021-02-09 DIAGNOSIS — L570 Actinic keratosis: Secondary | ICD-10-CM

## 2021-02-09 DIAGNOSIS — D1800 Hemangioma unspecified site: Secondary | ICD-10-CM

## 2021-02-09 DIAGNOSIS — Z1283 Encounter for screening for malignant neoplasm of skin: Secondary | ICD-10-CM

## 2021-02-09 DIAGNOSIS — D229 Melanocytic nevi, unspecified: Secondary | ICD-10-CM

## 2021-02-09 NOTE — Patient Instructions (Addendum)
Cryotherapy Aftercare  Wash gently with soap and water everyday.   Apply Vaseline and Band-Aid daily until healed.   Prior to procedure, discussed risks of blister formation, small wound, skin dyspigmentation, or rare scar following cryotherapy. Recommend Vaseline ointment to treated areas while healing.  Recommend daily broad spectrum sunscreen SPF 30+ to sun-exposed areas, reapply every 2 hours as needed. Call for new or changing lesions.  Staying in the shade or wearing long sleeves, sun glasses (UVA+UVB protection) and wide brim hats (4-inch brim around the entire circumference of the hat) are also recommended for sun protection.    Melanoma ABCDEs  Melanoma is the most dangerous type of skin cancer, and is the leading cause of death from skin disease.  You are more likely to develop melanoma if you: Have light-colored skin, light-colored eyes, or red or blond hair Spend a lot of time in the sun Tan regularly, either outdoors or in a tanning bed Have had blistering sunburns, especially during childhood Have a close family member who has had a melanoma Have atypical moles or large birthmarks  Early detection of melanoma is key since treatment is typically straightforward and cure rates are extremely high if we catch it early.   The first sign of melanoma is often a change in a mole or a new dark spot.  The ABCDE system is a way of remembering the signs of melanoma.  A for asymmetry:  The two halves do not match. B for border:  The edges of the growth are irregular. C for color:  A mixture of colors are present instead of an even brown color. D for diameter:  Melanomas are usually (but not always) greater than 76mm - the size of a pencil eraser. E for evolution:  The spot keeps changing in size, shape, and color.  Please check your skin once per month between visits. You can use a small mirror in front and a large mirror behind you to keep an eye on the back side or your body.   If  you see any new or changing lesions before your next follow-up, please call to schedule a visit.  Please continue daily skin protection including broad spectrum sunscreen SPF 30+ to sun-exposed areas, reapplying every 2 hours as needed when you're outdoors.    If you have any questions or concerns for your doctor, please call our main line at [phone] and press option 4 to reach your doctor's medical assistant. If no one answers, please leave a voicemail as directed and we will return your call as soon as possible. Messages left after 4 pm will be answered the following business day.   You may also send [place] a message via [place]. We typically respond to MyChart messages within 1-2 business days.  For prescription refills, please ask your pharmacy to contact our office. Our fax number is [phone].  If you have an urgent issue when the clinic is closed that cannot wait until the next business day, you can page your doctor at the number below.    Please note that while we do our best to be available for urgent issues outside of office hours, we are not available 24/7.   If you have an urgent issue and are unable to reach [place], you may choose to seek medical care at your doctor's office, retail clinic, urgent care center, or emergency room.  If you have a medical emergency, please immediately call 911 or go to the emergency department.  Pager Numbers  -  Dr. Nehemiah Massed: [phone]  - Dr. Laurence Ferrari: [phone]  - Dr. Nicole Kindred: [phone]  In the event of inclement weather, please call our main line at [phone] for an update on the status of any delays or closures.  Dermatology Medication Tips: Please keep the boxes that topical medications come in in order to help keep track of the instructions about where and how to use these. Pharmacies typically print the medication instructions only on the boxes and not directly on the medication tubes.   If your medication is too expensive,  please contact our office at [phone] option 4 or send [place] a message through [place].   We are unable to tell what your co-pay for medications will be in advance as this is different depending on your insurance coverage. However, we may be able to find a substitute medication at lower cost or fill out paperwork to get insurance to cover a needed medication.   If a prior authorization is required to get your medication covered by your insurance company, please allow [place] 1-2 business days to complete this process.  Drug prices often vary depending on where the prescription is filled and some pharmacies may offer cheaper prices.  The website www.goodrx.com contains coupons for medications through different pharmacies. The prices here do not account for what the cost may be with help from insurance (it may be cheaper with your insurance), but the website can give you the price if you did not use any insurance.  - You can print the associated coupon and take it with your prescription to the pharmacy.  - You may also stop by our office during regular business hours and pick up a GoodRx coupon card.  - If you need your prescription sent electronically to a different pharmacy, notify our office through [facility] or by phone at [phone] option 4.

## 2021-02-09 NOTE — Progress Notes (Signed)
Follow-Up Visit   Subjective  Misty Ferguson is a 87 y.o. female who presents for the following: FBSE (Patient here for full body skin exam and skin cancer screening. Patient with hx of SCC and AK's. She is not aware of any new or changing spots. ).  Patient accompanied by husband.   The following portions of the chart were reviewed this encounter and updated as appropriate:   Tobacco  Allergies  Meds  Problems  Med Hx  Surg Hx  Fam Hx      Review of Systems:  No other skin or systemic complaints except as noted in HPI or Assessment and Plan.  Objective  Well appearing patient in no apparent distress; mood and affect are within normal limits.  A full examination was performed including scalp, head, eyes, ears, nose, lips, neck, chest, axillae, abdomen, back, buttocks, bilateral upper extremities, bilateral lower extremities, hands, feet, fingers, toes, fingernails, and toenails. All findings within normal limits unless otherwise noted below.  Right dorsal hand x 1, right upper arm x 1, left hand x 1, left forearm x 1, left upper arm x 2, left preauricular x 1, left calf x 2 (9) Erythematous thin papules/macules with gritty scale.    Assessment & Plan  AK (actinic keratosis) (9) Right dorsal hand x 1, right upper arm x 1, left hand x 1, left forearm x 1, left upper arm x 2, left preauricular x 1, left calf x 2  Prior to procedure, discussed risks of blister formation, small wound, skin dyspigmentation, or rare scar following cryotherapy. Recommend Vaseline ointment to treated areas while healing.  Actinic keratoses are precancerous spots that appear secondary to cumulative UV radiation exposure/sun exposure over time. They are chronic with expected duration over 1 year. A portion of actinic keratoses will progress to squamous cell carcinoma of the skin. It is not possible to reliably predict which spots will progress to skin cancer and so treatment is recommended to prevent  development of skin cancer.  Recommend daily broad spectrum sunscreen SPF 30+ to sun-exposed areas, reapply every 2 hours as needed.  Recommend staying in the shade or wearing long sleeves, sun glasses (UVA+UVB protection) and wide brim hats (4-inch brim around the entire circumference of the hat). Call for new or changing lesions.   Destruction of lesion - Right dorsal hand x 1, right upper arm x 1, left hand x 1, left forearm x 1, left upper arm x 2, left preauricular x 1, left calf x 2  Destruction method: cryotherapy   Informed consent: discussed and consent obtained   Lesion destroyed using liquid nitrogen: Yes   Cryotherapy cycles:  2 Outcome: patient tolerated procedure well with no complications   Post-procedure details: wound care instructions given    Lentigines - Scattered tan macules - Due to sun exposure - Benign-appering, observe - Recommend daily broad spectrum sunscreen SPF 30+ to sun-exposed areas, reapply every 2 hours as needed. - Call for any changes  Seborrheic Keratoses - Stuck-on, waxy, tan-brown papules and/or plaques  - Benign-appearing - Discussed benign etiology and prognosis. - Observe - Call for any changes  Melanocytic Nevi - Tan-brown and/or pink-flesh-colored symmetric macules and papules - Benign appearing on exam today - Observation - Call clinic for new or changing moles - Recommend daily use of broad spectrum spf 30+ sunscreen to sun-exposed areas.   Hemangiomas - Red papules - Discussed benign nature - Observe - Call for any changes  Actinic Damage - Chronic condition,  secondary to cumulative UV/sun exposure - diffuse scaly erythematous macules with underlying dyspigmentation - Recommend daily broad spectrum sunscreen SPF 30+ to sun-exposed areas, reapply every 2 hours as needed.  - Staying in the shade or wearing long sleeves, sun glasses (UVA+UVB protection) and wide brim hats (4-inch brim around the entire circumference of the  hat) are also recommended for sun protection.  - Call for new or changing lesions.  Skin cancer screening performed today.  History of Squamous Cell Carcinoma of the Skin - No evidence of recurrence today at right pretibia superior - No lymphadenopathy - Recommend regular full body skin exams - Recommend daily broad spectrum sunscreen SPF 30+ to sun-exposed areas, reapply every 2 hours as needed.  - Call if any new or changing lesions are noted between office visits   Return in about 2 months (around 04/11/2021) for recheck SCC site at right pretibia.  Graciella Belton, RMA, am acting as scribe for Forest Gleason, MD .  Documentation: I have reviewed the above documentation for accuracy and completeness, and I agree with the above.  Forest Gleason, MD

## 2021-02-14 ENCOUNTER — Encounter: Payer: Self-pay | Admitting: Family Medicine

## 2021-02-14 ENCOUNTER — Telehealth (INDEPENDENT_AMBULATORY_CARE_PROVIDER_SITE_OTHER): Payer: Medicare HMO | Admitting: Family Medicine

## 2021-02-14 VITALS — BP 117/70 | HR 65 | Temp 98.4°F | Ht 61.0 in | Wt 96.0 lb

## 2021-02-14 DIAGNOSIS — U071 COVID-19: Secondary | ICD-10-CM

## 2021-02-14 MED ORDER — MOLNUPIRAVIR EUA 200MG CAPSULE: 4.0000 | ORAL_CAPSULE | Freq: Two times a day (BID) | ORAL | 0 refills | Status: AC

## 2021-02-14 NOTE — Progress Notes (Signed)
VIRTUAL VISIT Due to national recommendations of social distancing due to [place] 19, a virtual visit is felt to be most appropriate for this patient at this time.   I connected with the patient on 02/14/21 at 12:20 PM EDT by virtual telehealth platform and verified that I am speaking with the correct person using two identifiers.   I discussed the limitations, risks, security and privacy concerns of performing an evaluation and management service by  virtual telehealth platform and the availability of in person appointments. I also discussed with the patient that there may be a patient responsible charge related to this service. The patient expressed understanding and agreed to proceed.  Patient location: Home Provider Location: [place] Participants: Eliezer Lofts and [name]   Chief Complaint  Patient presents with   Covid Positive    +Home Test Saturday morning   Cough    With a lot of mucus   Fatigue   Shooting pain in rib cage and under breast bone    History of Present Illness: 87 year old female with severe COVID on continuous oxygen presents with COVID infection.  Symptoms started 02/11/21.. she reports productive cough. Some increase in SOB, but not severe. She has pain under  rib cage... ibuprofen helps.  She is fatigue.  No ST, no sinus  pain. Low grade temp at onset.   GFR 57   Oxygen requirement stable on 4 L [place].   She is using the Symbicort BID, albuterol neb not required. Using Robitussin  COVID 19 screen COVID testing:home test 7/2 COVID vaccine:none COVID exposure: No recent travel or known exposure to [place]  The importance of social distancing was discussed today.    Review of Systems  Constitutional:  Negative for chills and fever.  HENT:  Negative for congestion and ear pain.   Eyes:  Negative for pain and redness.  Respiratory:  Positive for cough, shortness of breath and wheezing.   Cardiovascular:  Negative for chest pain,  palpitations and leg swelling.  Gastrointestinal:  Negative for abdominal pain, blood in stool, constipation, diarrhea, nausea and vomiting.  Genitourinary:  Negative for dysuria.  Musculoskeletal:  Negative for falls and myalgias.  Skin:  Negative for rash.  Neurological:  Negative for dizziness.  Psychiatric/Behavioral:  Negative for depression. The patient is not nervous/anxious.      Past Medical History:  Diagnosis Date   AK (actinic keratosis) 12/08/2020   right pretibia inferior, bx proven   Burping    Chronic airway obstruction, not elsewhere classified    Dyspnea    Dysrhythmia    Macular degeneration (senile) of retina, unspecified    Malignant neoplasm of urinary bladder ([place]) 03/2019   Partial bladder resection and Rad tx's.    Obstructive chronic bronchitis with exacerbation (HCC)    Osteoporosis, unspecified    Oxygen deficiency    Personal history of peptic ulcer disease    SCC (squamous cell carcinoma) 12/08/2020   right pretibia superior   Tobacco use disorder    Unspecified essential hypertension    Unspecified glaucoma(365.9)     reports that she quit smoking about 17 years ago. Her smoking use included cigarettes. She has a 58.50 pack-year smoking history. She has never used smokeless tobacco. She reports current alcohol use of about 14.0 standard drinks of alcohol per week. She reports that she does not use drugs.   Current Outpatient Medications:    albuterol (PROVENTIL) (2.5 MG/3ML) 0.083% nebulizer solution, INHALE THE CONTENTS OF 1  VIAL VIA NEBULIZER EVERY 6 HOURS AS NEEDED FOR WHEEZING OR FOR SHORTNESS OF BREATH, Disp: 360 mL, Rfl: 3   ascorbic acid (VITAMIN C) 1000 MG tablet, Take 1,000 mg by mouth daily. , Disp: , Rfl:    budesonide-formoterol (SYMBICORT) 160-4.5 MCG/ACT inhaler, Inhale 2 puffs into the lungs in the morning and at bedtime., Disp: 3 each, Rfl: 0   Cholecalciferol (VITAMIN D) 125 MCG (5000 UT) CAPS, Take 5,000 Units by mouth daily., Disp:  , Rfl:    docusate sodium (COLACE) 100 MG capsule, Take 100 mg by mouth 2 (two) times daily., Disp: , Rfl:    losartan (COZAAR) 25 MG tablet, 25 mg daily. , Disp: , Rfl:    Multiple Vitamins-Minerals (PRESERVISION AREDS) TABS, Take 1 tablet by mouth 2 (two) times daily., Disp: , Rfl:    mupirocin ointment (BACTROBAN) 2 %, Apply 1 application topically daily. Apply to affected area and cover with band aide, Disp: 44 each, Rfl: 1   Omega-3 Fatty Acids (FISH OIL) 1000 MG CAPS, Take 1,000 mg by mouth 2 (two) times daily. , Disp: , Rfl:    OXYGEN, Inhale 4 L into the lungs as needed., Disp: , Rfl:    verapamil (VERELAN PM) 360 MG 24 hr capsule, Take 360 mg by mouth daily., Disp: , Rfl:    Vitamin E 180 MG CAPS, Take 180 mg by mouth daily., Disp: , Rfl:    Observations/Objective: Blood pressure 117/70, pulse 65, temperature 98.4 F (36.9 C), temperature source Temporal, height 5\' 1"  (1.549 m), weight 96 lb (43.5 kg), SpO2 98 %.  Physical Exam  Physical Exam Constitutional:      General: The patient is not in acute distress. Pulmonary:     Effort: Pulmonary effort is normal. No respiratory distress.  Neurological:     Mental Status: The patient is alert and oriented to person, place, and time.  Psychiatric:        Mood and Affect: Mood normal.        Behavior: Behavior normal.   Assessment and Plan  Problem List Items Addressed This Visit     COVID-19 virus infection - Primary    COVID19  Infection < 5 days from onset of symptoms in unvaccinated overweight individual with history of  Severe COPD, oxygen dependant.  No clear sign of bacterial infection at this time.   No SOB.  No red flags/need for ER visit or in-person exam at respiratory clinic at this time..    Pt higher risk for COVID complications. GFR  57.. will use molnupiravir. Offered MAB infusion.  Start molnupiravir 5 day course. Reviewed course of medication and side effect profile with patient in detail.   Symptomatic  care with mucinex and cough suppressant at night. If SOB begins symptoms worsening.. have low threshold for in-person exam, if severe shortness of breath ER visit recommended.  Can monitor Oxygen saturation at home with home monitor if able to obtain.  Go to ER if O2 sat < 90% on room air.   Reviewed home care and provided information through [place].  Recommended quarantine 5 days isolation recommended. Return to work day 6 and wear mask for 4 more days to complete 10 days. Provided info about prevention of spread of COVID 19.          I discussed the assessment and treatment plan with the patient. The patient was provided an opportunity to ask questions and all were answered. The patient agreed with the plan and demonstrated an  understanding of the instructions.   The patient was advised to call back or seek an in-person evaluation if the symptoms worsen or if the condition fails to improve as anticipated.     Eliezer Lofts, MD

## 2021-02-20 NOTE — Telephone Encounter (Signed)
I spoke with pt; pt had video visit on 02/14/21; pt had + covid on 02/11/21. Pt finished taking the molnupiravir on 02/19/21. Now no fever, chills or bodyaches. Pt said her chest is very congested with mucus; prod cough still with clear phlegm but a times "big hunks of clear mucus come out". Pt has SOB with COPD and is on 4L of oxygen 24/7. Pt said the SOB is worse than usual due to all the mucus pt thinks she has in her lungs. Pt has runny nose but no other covid symptoms.Pt sees pulmonary doctor and pt has appt with pulmonary 03/14/21. Pt said that she is not in any distress with breathing at this time. Pt said she does not need to go to ED.pt pulse ox now is 95 now. Pt wonders if pt should take prednisone as discussed earlier. Pt request cb after reviewed by Dr [name].. UC and ED precautions given and pt voiced understanding.[facility].

## 2021-02-20 NOTE — Telephone Encounter (Signed)
Please triage

## 2021-02-21 ENCOUNTER — Other Ambulatory Visit: Payer: Self-pay | Admitting: Family Medicine

## 2021-02-21 MED ORDER — PREDNISONE 20 MG PO TABS: ORAL_TABLET | ORAL | 0 refills | Status: DC

## 2021-02-23 ENCOUNTER — Encounter: Payer: Self-pay | Admitting: Dermatology

## 2021-03-14 ENCOUNTER — Other Ambulatory Visit: Payer: Self-pay

## 2021-03-14 ENCOUNTER — Ambulatory Visit: Payer: Medicare HMO | Admitting: Internal Medicine

## 2021-03-14 ENCOUNTER — Encounter: Payer: Self-pay | Admitting: Internal Medicine

## 2021-03-14 VITALS — BP 118/78 | HR 90 | Temp 98.4°F | Ht 61.0 in | Wt 95.0 lb

## 2021-03-14 DIAGNOSIS — R0689 Other abnormalities of breathing: Secondary | ICD-10-CM

## 2021-03-14 DIAGNOSIS — J449 Chronic obstructive pulmonary disease, unspecified: Secondary | ICD-10-CM

## 2021-03-14 DIAGNOSIS — J9611 Chronic respiratory failure with hypoxia: Secondary | ICD-10-CM | POA: Diagnosis not present

## 2021-03-14 MED ORDER — TRELEGY ELLIPTA 200-62.5-25 MCG/INH IN AEPB: 200.0000 ug | INHALATION_SPRAY | Freq: Every day | RESPIRATORY_TRACT | 0 refills | Status: DC

## 2021-03-14 NOTE — Patient Instructions (Addendum)
LET'S START TRELEGY 200 (1 puff daily) Hold Symbicort for now  ALBUTEROL NEBS EVERY 6 HRS AS NEEDED (Please use incentive spirometry and flutter valve after neb therapy)  Continue oxygen therapy  Will wait for paper work from company for new oxygen machine

## 2021-03-14 NOTE — Progress Notes (Signed)
[facility] Consultation     Date: 03/14/2021,   MRN# [id number] Misty Ferguson Ferguson    Admission                  Current   Synopsis: Misty Ferguson was first seen by the [facility] pulmonary clinic in the summer of 2013. She has COPD. Simple spirometry performed in clinic showed an FEV1 of 45% predicted with clear obstruction.  She smoked 2 pack/day x 40 years, quit  17 years ago according to patient. She is a frequent exacerbation phenotype. She uses 2 L of oxygen continuously with exertion and at night   lling, mild erythema CC  Follow-up COPD Follow-up hypoxic respiratory failure  HPI Chronic shortness of breath and dyspnea on exertion seems to be worsening over the last several years Patient on oxygen therapy 24/7 Patient is this for survival  PATIENT DIAGNOSED WITH COVID July 2. PATIENT HAS BEEN DECLINING SINCE THEN ALSO RAN OUT OF OXYGEN  No exacerbation at this time No evidence of heart failure at this time No evidence or signs of infection at this time No respiratory distress No fevers, chills, nausea, vomiting, diarrhea No evidence of lower extremity edema No evidence hemoptysis  Patient diagnosed with bladder cancer sees urology o signs of CHF or lower ext swelling at t I encouraged ALB USAGE WITH FLUTTER VALVE AND INCENTIVE SPIROMETRY ssues, has chCurrent Medication:   Current Outpatient Medications:    albuterol (PROVENTIL) (2.5 MG/3ML) 0.083% nebulizer solution, INHALE THE CONTENTS OF 1 VIAL VIA NEBULIZER EVERY 6 HOURS AS NEEDED FOR WHEEZING OR FOR SHORTNESS OF BREATH, Disp: 360 mL, Rfl: 3   ascorbic acid (VITAMIN C) 1000 MG tablet, Take 1,000 mg by mouth daily. , Disp: , Rfl:    budesonide-formoterol (SYMBICORT) 160-4.5 MCG/ACT inhaler, Inhale 2 puffs into the lungs in the morning and at bedtime., Disp: 3 each, Rfl: 0   Cholecalciferol (VITAMIN D) 125 MCG (5000 UT) CAPS, Take 5,000 Units by mouth daily., Disp: , Rfl:     docusate sodium (COLACE) 100 MG capsule, Take 100 mg by mouth 2 (two) times daily., Disp: , Rfl:    losartan (COZAAR) 25 MG tablet, 25 mg daily. , Disp: , Rfl:    Multiple Vitamins-Minerals (PRESERVISION AREDS) TABS, Take 1 tablet by mouth 2 (two) times daily., Disp: , Rfl:    mupirocin ointment (BACTROBAN) 2 %, Apply 1 application topically daily. Apply to affected area and cover with band aide, Disp: 44 each, Rfl: 1   Omega-3 Fatty Acids (FISH OIL) 1000 MG CAPS, Take 1,000 mg by mouth 2 (two) times daily. , Disp: , Rfl:    OXYGEN, Inhale 4 L into the lungs as needed., Disp: , Rfl:    predniSONE (DELTASONE) 20 MG tablet, 3 tabs by mouth daily x 3 days, then 2 tabs by mouth daily x 2 days then 1 tab by mouth daily x 2 days, Disp: 15 tablet, Rfl: 0   verapamil (VERELAN PM) 360 MG 24 hr capsule, Take 360 mg by mouth daily., Disp: , Rfl:    Vitamin E 180 MG CAPS, Take 180 mg by mouth daily., Disp: , Rfl:      ALLERGIES   Patient has no known allergies.     Review of Systems:  Gen:  Denies  fever, sweats, chills weight loss  HEENT: Denies blurred vision, double vision, ear pain, eye pain, hearing loss, nose bleeds, sore throat Cardiac:  No dizziness, chest pain or heaviness, chest  tightness,edema, No JVD Resp:   + cough, +sputum production, +shortness of breath,-wheezing, -hemoptysis,  Gi: Denies swallowing difficulty, stomach pain, nausea or vomiting, diarrhea, constipation, bowel incontinence Other:  All other systems negative  BP 118/78 (BP Location: Left Arm, Patient Position: Sitting, Cuff Size: Normal)   Pulse 90   Temp 98.4 F (36.9 C) (Oral)   Ht '5\' 1"'$  (1.549 m)   Wt 95 lb (43.1 kg)   SpO2 90%   BMI 17.95 kg/m     Physical Examination:   General Appearance: No distress  EYES PERRLA, EOM intact.   NECK Supple, No JVD Pulmonary: +wheezing. +rhonchi CardiovascularNormal S1,S2.  No m/r/g.   Abdomen: Benign, Soft, non-tender. Skin:   warm, no rashes, no ecchymosis   Extremities: normal, no cyanosis, clubbing. Neuro:without focal findings,  speech normal  PSYCHIATRIC: Mood, affect within normal limits.   ALL OTHER ROS ARE NEGATIVE       ASSESSMENT/PLAN   87 year old white female seen today for follow-up assessment for Gold stage D end-stage severe COPD with chronic hypoxic respiratory failure with CT findings suggestive of severe emphysema and bronchiectasis with right lower lobe pleural-based nodule   She has unable to tolerate traditional inhaler therapy therefore she needs nebulized therapy due to her poor respiratory insufficiency Patient is a very high risk for pulmonary complications if she were to undergo any type of procedure especially bronchoscopy  End-stage COPD Nebulizers are way too expensive for patient She is currently on Symbicort which they can afford Will try TRELEGY inhaler and assess for changes Albuterol NEB as needed in conjunction with spirometry and flutter valve  Chronic hypoxic respiratory failure from COPD Patient uses oxygen therapy 24/7 Patient continues to use and benefit from oxygen therapy Patient needs this for survival   Right pleural-based nodule based on CT scan findings Patient does not want repeat CT chest to assess interval changes She does not want to pursue any further diagnostic testing She is happy with her life and does not want any type of work-up for this nodule    MEDICATION ADJUSTMENTS/LABS AND TESTS ORDERED: Hold Symbicort Start TRELEGY 200 Continue oxygen therapy  Follow-up in 6 months  TOTAL TIME SPENT 42 mins    Maretta Bees Patricia Pesa, M.D.  [facility]  Medical Director [place] Director [facility] Cardio-Pulmonary Department

## 2021-04-03 DIAGNOSIS — E875 Hyperkalemia: Secondary | ICD-10-CM | POA: Diagnosis not present

## 2021-04-03 DIAGNOSIS — J9611 Chronic respiratory failure with hypoxia: Secondary | ICD-10-CM | POA: Diagnosis not present

## 2021-04-03 DIAGNOSIS — I10 Essential (primary) hypertension: Secondary | ICD-10-CM | POA: Diagnosis not present

## 2021-04-03 DIAGNOSIS — J432 Centrilobular emphysema: Secondary | ICD-10-CM | POA: Diagnosis not present

## 2021-04-03 DIAGNOSIS — E7800 Pure hypercholesterolemia, unspecified: Secondary | ICD-10-CM | POA: Diagnosis not present

## 2021-04-03 DIAGNOSIS — Z87898 Personal history of other specified conditions: Secondary | ICD-10-CM | POA: Diagnosis not present

## 2021-04-03 DIAGNOSIS — R0602 Shortness of breath: Secondary | ICD-10-CM | POA: Diagnosis not present

## 2021-04-12 ENCOUNTER — Encounter: Payer: Self-pay | Admitting: Dermatology

## 2021-04-12 ENCOUNTER — Ambulatory Visit: Payer: Medicare HMO | Admitting: Dermatology

## 2021-04-12 ENCOUNTER — Other Ambulatory Visit: Payer: Self-pay

## 2021-04-12 DIAGNOSIS — L570 Actinic keratosis: Secondary | ICD-10-CM

## 2021-04-12 DIAGNOSIS — Z85828 Personal history of other malignant neoplasm of skin: Secondary | ICD-10-CM | POA: Diagnosis not present

## 2021-04-12 NOTE — Patient Instructions (Signed)

## 2021-04-12 NOTE — Progress Notes (Signed)
   Follow-Up Visit   Subjective  Misty Ferguson is a 87 y.o. female who presents for the following: Actinic Keratosis (Recheck previously tx AK's of the right dorsal hand x 1, right upper arm x 1, left hand x 1, left forearm x 1, left upper arm x 2, left preauricular x 1, left calf x 2) and recheck SCC site (Of the R pretibial ).  The following portions of the chart were reviewed this encounter and updated as appropriate:   Tobacco  Allergies  Meds  Problems  Med Hx  Surg Hx  Fam Hx     Review of Systems:  No other skin or systemic complaints except as noted in HPI or Assessment and Plan.  Objective  Well appearing patient in no apparent distress; mood and affect are within normal limits.  A focused examination was performed including extremities, neck. Relevant physical exam findings are noted in the Assessment and Plan.  L preauricular x 1, L calf x 1 (2) Erythematous thin papules/macules with gritty scale.    Assessment & Plan  AK (actinic keratosis) (2) L preauricular x 1, L calf x 1  Persistent - recheck at follow up  L calf hypertrophic - if L calf still present consider biopsy.  Destruction of lesion - L preauricular x 1, L calf x 1 Complexity: simple   Destruction method: cryotherapy   Informed consent: discussed and consent obtained   Timeout:  patient name, date of birth, surgical site, and procedure verified Lesion destroyed using liquid nitrogen: Yes   Region frozen until ice ball extended beyond lesion: Yes   Outcome: patient tolerated procedure well with no complications   Post-procedure details: wound care instructions given    History of Squamous Cell Carcinoma of the Skin - No evidence of recurrence today - No lymphadenopathy - Recommend regular full body skin exams - Recommend daily broad spectrum sunscreen SPF 30+ to sun-exposed areas, reapply every 2 hours as needed.  - Call if any new or changing lesions are noted between office visits  Return  for F/U appt. in 6-8 weeks at 8 am.  I, Rudell Cobb, CMA, am acting as scribe for Forest Gleason, MD .  Documentation: I have reviewed the above documentation for accuracy and completeness, and I agree with the above.  Forest Gleason, MD

## 2021-04-21 DIAGNOSIS — U071 COVID-19: Secondary | ICD-10-CM | POA: Insufficient documentation

## 2021-04-21 NOTE — Assessment & Plan Note (Addendum)
COVID19  Infection < 5 days from onset of symptoms in unvaccinated overweight individual with history of  Severe COPD, oxygen dependant.  No clear sign of bacterial infection at this time.   No SOB.  No red flags/need for ER visit or in-person exam at respiratory clinic at this time..    Pt higher risk for COVID complications. GFR  57.. will use molnupiravir. Offered MAB infusion.  Start molnupiravir 5 day course. Reviewed course of medication and side effect profile with patient in detail.   Symptomatic care with mucinex and cough suppressant at night. If SOB begins symptoms worsening.. have low threshold for in-person exam, if severe shortness of breath ER visit recommended.  Can monitor Oxygen saturation at home with home monitor if able to obtain.  Go to ER if O2 sat < 90% on room air.   Reviewed home care and provided information through [place].  Recommended quarantine 5 days isolation recommended. Return to work day 6 and wear mask for 4 more days to complete 10 days. Provided info about prevention of spread of COVID 19.

## 2021-04-26 ENCOUNTER — Telehealth: Payer: Self-pay | Admitting: Urology

## 2021-04-28 ENCOUNTER — Ambulatory Visit
Admission: RE | Admit: 2021-04-28 | Discharge: 2021-04-28 | Disposition: A | Discharge status: Home / Self Care | Payer: Medicare HMO | Source: Ambulatory Visit | Attending: Urology | Admitting: Urology

## 2021-04-28 ENCOUNTER — Ambulatory Visit (INDEPENDENT_AMBULATORY_CARE_PROVIDER_SITE_OTHER): Payer: Medicare HMO

## 2021-04-28 ENCOUNTER — Other Ambulatory Visit: Payer: Self-pay

## 2021-04-28 DIAGNOSIS — Z8551 Personal history of malignant neoplasm of bladder: Secondary | ICD-10-CM | POA: Diagnosis not present

## 2021-04-28 DIAGNOSIS — J90 Pleural effusion, not elsewhere classified: Secondary | ICD-10-CM | POA: Diagnosis not present

## 2021-04-28 DIAGNOSIS — C679 Malignant neoplasm of bladder, unspecified: Secondary | ICD-10-CM | POA: Diagnosis not present

## 2021-04-28 DIAGNOSIS — Z23 Encounter for immunization: Secondary | ICD-10-CM | POA: Diagnosis not present

## 2021-05-09 ENCOUNTER — Other Ambulatory Visit: Payer: Self-pay

## 2021-05-09 ENCOUNTER — Ambulatory Visit (INDEPENDENT_AMBULATORY_CARE_PROVIDER_SITE_OTHER): Payer: Medicare HMO | Admitting: Urology

## 2021-05-09 DIAGNOSIS — J90 Pleural effusion, not elsewhere classified: Secondary | ICD-10-CM

## 2021-05-09 DIAGNOSIS — Z8551 Personal history of malignant neoplasm of bladder: Secondary | ICD-10-CM | POA: Diagnosis not present

## 2021-05-09 NOTE — Progress Notes (Signed)
Virtual Visit via Telephone Note  I connected with Misty Ferguson on 05/09/21 at 11:15 AM EDT by telephone and verified that I am speaking with the correct person using two identifiers.   Patient location: Home Provider location: [facility]   I discussed the limitations, risks, security and privacy concerns of performing an evaluation and management service by telephone and the availability of in person appointments. We discussed the impact of the COVID-19 pandemic on the healthcare system, and the importance of social distancing and reducing patient and provider exposure. I also discussed with the patient that there may be a patient responsible charge related to this service. The patient expressed understanding and agreed to proceed.  Reason for visit: History of bladder cancer  History of Present Illness: -Presented 04/2019 with gross hematuria, CTU showed 3cm left sided bladder mass with left hydronephrosis, sCr 1.46(eGFR33). Refused chest CT. -TURBT 05/22/2019 with resection of all visible disease(HG T2 urothelial cell carcinoma with squamous differentiation), left ureteral stent placement. Renal function improved after stent to 0.98(eGFR 54), stent removed in follow-up -Radiation to bladder and pelvic nodes with Dr. Baruch Gouty completed 07/2019, she refused concurrent chemotherapy  With her comorbidities of severe COPD, she has opted for less aggressive surveillance.  We discussed at length previously the risks of missing window of care from early detection, and she understands these risk.  She is opted for yearly renal ultrasounds.  I personally viewed and interpreted her ultrasound dated 04/28/2021 that shows no hydronephrosis or bladder abnormalities, small left pleural effusion.  We reviewed the risks of recurrence again today.  She denies any gross hematuria or significant urinary symptoms.  I recommended a chest x-ray with her COPD and small left pleural effusion seen on  ultrasound, and messaged her PCP as well.   Follow Up: -Chest x-ray for further evaluation of small pleural effusion seen on renal ultrasound, will call with results and forward to PCP -RTC 1 year renal/bladder ultrasound for history of muscle invasive bladder cancer   I discussed the assessment and treatment plan with the patient. The patient was provided an opportunity to ask questions and all were answered. The patient agreed with the plan and demonstrated an understanding of the instructions.   The patient was advised to call back or seek an in-person evaluation if the symptoms worsen or if the condition fails to improve as anticipated.  I provided 13 minutes of non-face-to-face time during this encounter.   Billey Co, MD

## 2021-05-12 ENCOUNTER — Telehealth: Payer: Self-pay

## 2021-05-12 NOTE — Progress Notes (Addendum)
    Chronic Care Management Pharmacy Assistant   Name: TAMORAH HADA  MRN: [id number] DOB: 1934-01-08  Reason for Encounter: Symbicort ([place] & [place]) PAP Update   05/19/2021 - Spoke with patient; she was placed on Trelegy for one month as a trial. Patient could not tell a difference so she was switched back to Symbicort. Patient would like to remain on Symbicort.   05/18/2021 - Called PT; she would like re-enrollment forms mailed to her and then she will mail back to the office. Renewal PAP for Symbicort (AZ & [place]) completed and uploaded.   05/11/2021 - Called [place] & [place]; Approved for Symbicort - [place] & [place] - expires 08/12/2021 Can re-enroll beginning 05/14/21  [name], CPP notified  Marijean Niemann, [place] Clinical Pharmacy Assistant [phone]

## 2021-05-19 NOTE — Telephone Encounter (Signed)
Application mailed to patient.  [name], PharmD Clinical Pharmacist [facility] [phone]

## 2021-05-23 ENCOUNTER — Ambulatory Visit
Admission: RE | Admit: 2021-05-23 | Discharge: 2021-05-23 | Disposition: A | Discharge status: Home / Self Care | Payer: Medicare HMO | Source: Ambulatory Visit | Attending: Urology | Admitting: Urology

## 2021-05-23 ENCOUNTER — Other Ambulatory Visit: Payer: Self-pay

## 2021-05-23 ENCOUNTER — Ambulatory Visit
Admission: RE | Admit: 2021-05-23 | Discharge: 2021-05-23 | Disposition: A | Discharge status: Home / Self Care | Payer: Medicare HMO | Attending: Urology | Admitting: Urology

## 2021-05-23 DIAGNOSIS — J90 Pleural effusion, not elsewhere classified: Secondary | ICD-10-CM | POA: Diagnosis not present

## 2021-05-23 DIAGNOSIS — J439 Emphysema, unspecified: Secondary | ICD-10-CM | POA: Diagnosis not present

## 2021-05-25 ENCOUNTER — Ambulatory Visit: Payer: Medicare HMO | Admitting: Dermatology

## 2021-05-25 ENCOUNTER — Other Ambulatory Visit: Payer: Self-pay

## 2021-05-25 ENCOUNTER — Encounter: Payer: Self-pay | Admitting: Dermatology

## 2021-05-25 ENCOUNTER — Telehealth: Payer: Self-pay | Admitting: Internal Medicine

## 2021-05-25 DIAGNOSIS — Z85828 Personal history of other malignant neoplasm of skin: Secondary | ICD-10-CM | POA: Diagnosis not present

## 2021-05-25 DIAGNOSIS — L308 Other specified dermatitis: Secondary | ICD-10-CM

## 2021-05-25 DIAGNOSIS — L570 Actinic keratosis: Secondary | ICD-10-CM

## 2021-05-25 NOTE — Telephone Encounter (Signed)
Dr. Kasa, please advise. thanks 

## 2021-05-25 NOTE — Telephone Encounter (Signed)
Patient dropped off AZ&ME application.  Application has been placed in Dr. Zoila Shutter folder for signature.  Patient would like to pickup once signed.

## 2021-05-25 NOTE — Patient Instructions (Signed)
Cryotherapy Aftercare  Wash gently with soap and water everyday.   Apply Vaseline and Band-Aid daily until healed.  Prior to procedure, discussed risks of blister formation, small wound, skin dyspigmentation, or rare scar following cryotherapy. Recommend Vaseline ointment to treated areas while healing.    If you have any questions or concerns for your doctor, please call our main line at [phone] and press option 4 to reach your doctor's medical assistant. If no one answers, please leave a voicemail as directed and we will return your call as soon as possible. Messages left after 4 pm will be answered the following business day.   You may also send us a message via MyChart. We typically respond to MyChart messages within 1-2 business days.  For prescription refills, please ask your pharmacy to contact our office. Our fax number is [phone].  If you have an urgent issue when the clinic is closed that cannot wait until the next business day, you can page your doctor at the number below.    Please note that while we do our best to be available for urgent issues outside of office hours, we are not available 24/7.   If you have an urgent issue and are unable to reach us, you may choose to seek medical care at your doctor's office, retail clinic, urgent care center, or emergency room.  If you have a medical emergency, please immediately call 911 or go to the emergency department.  Pager Numbers  - Dr. Kowalski: [phone]  - Dr. Moye: [phone]  - Dr. Stewart: [phone]  In the event of inclement weather, please call our main line at [phone] for an update on the status of any delays or closures.  Dermatology Medication Tips: Please keep the boxes that topical medications come in in order to help keep track of the instructions about where and how to use these. Pharmacies typically print the medication instructions only on the boxes and not directly on the medication  tubes.   If your medication is too expensive, please contact our office at [phone] option 4 or send us a message through MyChart.   We are unable to tell what your co-pay for medications will be in advance as this is different depending on your insurance coverage. However, we may be able to find a substitute medication at lower cost or fill out paperwork to get insurance to cover a needed medication.   If a prior authorization is required to get your medication covered by your insurance company, please allow us 1-2 business days to complete this process.  Drug prices often vary depending on where the prescription is filled and some pharmacies may offer cheaper prices.  The website www.goodrx.com contains coupons for medications through different pharmacies. The prices here do not account for what the cost may be with help from insurance (it may be cheaper with your insurance), but the website can give you the price if you did not use any insurance.  - You can print the associated coupon and take it with your prescription to the pharmacy.  - You may also stop by our office during regular business hours and pick up a GoodRx coupon card.  - If you need your prescription sent electronically to a different pharmacy, notify our office through [place] MyChart or by phone at [phone] option 4.  

## 2021-05-25 NOTE — Progress Notes (Signed)
   Follow-Up Visit   Subjective  Misty Ferguson is a 87 y.o. female who presents for the following: Follow-up (Patient here today for 6 week AK follow up at left calf and left preauricular. Patient does have a recent hx of SCC.) and Eczema (Patient would like to know if there is anything she can use for eczema at the legs. The only thing she uses now is Cetaphil cream. Patient advises when she rubs her hand on her legs the hair hurts the skin.).  Patient accompanied by husband.   The following portions of the chart were reviewed this encounter and updated as appropriate:   Tobacco  Allergies  Meds  Problems  Med Hx  Surg Hx  Fam Hx      Review of Systems:  No other skin or systemic complaints except as noted in HPI or Assessment and Plan.  Objective  Well appearing patient in no apparent distress; mood and affect are within normal limits.  A focused examination was performed including legs, face. Relevant physical exam findings are noted in the Assessment and Plan.  Left calf x 1, left preauricular x 1 (2) Erythematous very thin plaques with gritty scale.   bilateral lower legs Erythematous patches at legs   Assessment & Plan  AK (actinic keratosis) (2) Left calf x 1, left preauricular x 1  Prior to procedure, discussed risks of blister formation, small wound, skin dyspigmentation, or rare scar following cryotherapy. Recommend Vaseline ointment to treated areas while healing.  Call if not resolved or recurs  Destruction of lesion - Left calf x 1, left preauricular x 1  Destruction method: cryotherapy   Informed consent: discussed and consent obtained   Lesion destroyed using liquid nitrogen: Yes   Cryotherapy cycles:  2 Outcome: patient tolerated procedure well with no complications   Post-procedure details: wound care instructions given    Other eczema bilateral lower legs  Chronic condition with duration or expected duration over one year. Condition is  bothersome to patient. Currently flared.  Pt defers prescription tacrolimus/protopic due to cost. Do not recommend steroid due to skin atrophy.   Recommend Vaseline or mixing Vaseline with Cetaphil  Return in about 9 months (around 02/22/2022) for TBSE.  Graciella Belton, RMA, am acting as scribe for Forest Gleason, MD .  Documentation: I have reviewed the above documentation for accuracy and completeness, and I agree with the above.  Forest Gleason, MD

## 2021-06-01 NOTE — Telephone Encounter (Signed)
Dr. Mortimer Fries, please advise on CXR results.  Thanks

## 2021-06-01 NOTE — Telephone Encounter (Signed)
Dr. Mortimer Fries is out of the office. Still awaiting form to be signed. Patient is aware via mychart message.

## 2021-06-05 NOTE — Telephone Encounter (Signed)
Form has been signed by Dr. Mortimer Fries.  Lm for patient.

## 2021-06-05 NOTE — Telephone Encounter (Signed)
Patient's spouse came by and picked up form. Nothing further needed at this time.

## 2021-06-19 ENCOUNTER — Telehealth: Payer: Self-pay

## 2021-06-19 NOTE — Telephone Encounter (Signed)
I have not seen any paperwork for Misty Ferguson.

## 2021-06-19 NOTE — Progress Notes (Addendum)
    Chronic Care Management Pharmacy Assistant   Name: JAMIAYA BINA  MRN: [id number] DOB: [birth year]  Reason for Encounter: CCM (Symbicort Patient Assistance - [place])  06/19/2021 - Response to Sharyn Lull - When I spoke to patient's husband they did not keep a copy. They sent in the copy they had.   06/19/2021 - Spoke with patient's husband Randall Hiss); he states they mailed patient's paperwork for Symbicort through [place] two weeks ago to [employer]. They sent it to the [place] location and verified the address as [address]   Time Spent: [place], CPP notified  Marijean Niemann, [place] Pharmacy Assistant [phone]

## 2021-06-19 NOTE — Telephone Encounter (Signed)
I see an application scanned in the chart from July 2022. If this was by mail I have not seen it that I know of.

## 2021-06-20 NOTE — Telephone Encounter (Signed)
Patient called, let them know we will look for paperwork. Azalee Course is going to check front desk. Paperwork was mailed to:   [name], PharmD [facility] [address] [place], [place]

## 2021-07-07 ENCOUNTER — Encounter: Payer: Self-pay | Admitting: Family Medicine

## 2021-07-10 ENCOUNTER — Encounter: Payer: Self-pay | Admitting: Family Medicine

## 2021-07-10 ENCOUNTER — Telehealth (INDEPENDENT_AMBULATORY_CARE_PROVIDER_SITE_OTHER): Payer: Medicare HMO | Admitting: Family Medicine

## 2021-07-10 ENCOUNTER — Other Ambulatory Visit: Payer: Self-pay

## 2021-07-10 VITALS — BP 155/76 | HR 92 | Temp 98.0°F | Ht 61.0 in | Wt 97.0 lb

## 2021-07-10 DIAGNOSIS — J441 Chronic obstructive pulmonary disease with (acute) exacerbation: Secondary | ICD-10-CM | POA: Diagnosis not present

## 2021-07-10 DIAGNOSIS — J208 Acute bronchitis due to other specified organisms: Secondary | ICD-10-CM | POA: Diagnosis not present

## 2021-07-10 DIAGNOSIS — J9611 Chronic respiratory failure with hypoxia: Secondary | ICD-10-CM | POA: Diagnosis not present

## 2021-07-10 MED ORDER — PREDNISONE 20 MG PO TABS: ORAL_TABLET | ORAL | 0 refills | Status: DC

## 2021-07-10 MED ORDER — AZITHROMYCIN 250 MG PO TABS: ORAL_TABLET | ORAL | 0 refills | Status: AC

## 2021-07-10 NOTE — Progress Notes (Signed)
Misty Worster T. Kinesha Auten, MD Primary Care and [place] at [facility] [place], [address] Phone: [phone]  FAX: [place] - 87 y.o. female  MRN [id number]  Date of Birth: [birth year]  Visit Date: 07/10/2021  PCP: [name], MD  Referred by: [name], MD  Virtual Visit via Telephone Note:  Chief Complaint  Patient presents with   Otalgia    Left-Negative Home  Covid Test this morning-Symptoms started last Wednesday night   Sore Throat   Cough    with chest congestion/slight yellow phelgm     I connected with  [name] on 07/10/2021 12:00 PM EST by telephone and verified that I am speaking with the correct person using two identifiers.   Location patient: home phone or cell phone Location provider: work or home office Consent: Verbal consent directly obtained from [facility] and that there may be a patient responsible charge related to this service. Persons participating in the virtual visit: patient, provider  I discussed the limitations of evaluation and management by telemedicine and the availability of in person appointments.  The patient expressed understanding and agreed to proceed.     History of Present Illness:  At baseline, she in on 4L of oxygen at all times with chronic emphysema. Covid neg with congestion, cough, ST.  Congestion in chest, ear ache, sore throat No temp BP normal 97 pounds  No n/v/d, UTI sx, no other complaints outside of above. Has alb nebs, but historically they have not helped much.  Drinking a lot of water  Review of Systems: pertinent positives and pertinent negatives as per HPI No acute distress verbally   Observations/Objective/Exam:  An attempt was made to discern vital signs over the phone and per patient if applicable and possible.   Neurological:     Mental Status: pleasant and appropriate   Psychiatric:        Thought  Content: Thought content normal.      Assessment and Plan:    ICD-10-CM   1. COPD with acute exacerbation ([place])  J44.1     2. Acute bronchitis due to other specified organisms  J20.8     3. Chronic hypoxemic respiratory failure (HCC)  J96.11      Total encounter time: 10 minutes. This includes total time spent on the day of encounter.    With chronic resp failure patient, more concerning.  Will give 10 d of steroids, zpak. She knows to call if she starts to get worse at all.  Very alert at 87 yo.  I discussed the assessment and treatment plan with the patient. The patient was provided an opportunity to ask questions and all were answered. The patient agreed with the plan and demonstrated an understanding of the instructions.   The patient was advised to call back or seek an in-person evaluation if the symptoms worsen or if the condition fails to improve as anticipated.  Follow-up: prn unless noted otherwise below No follow-ups on file.  Meds ordered this encounter  Medications   predniSONE (DELTASONE) 20 MG tablet    Sig: 2 tabs po daily for 5 days, then 1 tab po daily for 5 days    Dispense:  15 tablet    Refill:  0   azithromycin (ZITHROMAX) 250 MG tablet    Sig: Take 2 tablets (500 mg total) by mouth daily for 1 day, THEN 1 tablet (250 mg  total) daily for 4 days.    Dispense:  6 tablet    Refill:  0   No orders of the defined types were placed in this encounter.   Signed,  Maud Deed. Judithann Villamar, MD

## 2021-07-21 DIAGNOSIS — H524 Presbyopia: Secondary | ICD-10-CM | POA: Diagnosis not present

## 2021-07-21 DIAGNOSIS — H353133 Nonexudative age-related macular degeneration, bilateral, advanced atrophic without subfoveal involvement: Secondary | ICD-10-CM | POA: Diagnosis not present

## 2021-09-20 ENCOUNTER — Encounter: Payer: Self-pay | Admitting: Internal Medicine

## 2021-09-20 ENCOUNTER — Other Ambulatory Visit: Payer: Self-pay

## 2021-09-20 ENCOUNTER — Ambulatory Visit: Payer: Medicare HMO | Admitting: Internal Medicine

## 2021-09-20 VITALS — BP 122/82 | HR 76 | Temp 97.1°F | Ht 61.0 in | Wt 96.0 lb

## 2021-09-20 DIAGNOSIS — J9610 Chronic respiratory failure, unspecified whether with hypoxia or hypercapnia: Secondary | ICD-10-CM

## 2021-09-20 DIAGNOSIS — G4719 Other hypersomnia: Secondary | ICD-10-CM

## 2021-09-20 DIAGNOSIS — J9620 Acute and chronic respiratory failure, unspecified whether with hypoxia or hypercapnia: Secondary | ICD-10-CM | POA: Diagnosis not present

## 2021-09-20 DIAGNOSIS — J949 Pleural condition, unspecified: Secondary | ICD-10-CM

## 2021-09-20 DIAGNOSIS — R0689 Other abnormalities of breathing: Secondary | ICD-10-CM

## 2021-09-20 DIAGNOSIS — G4710 Hypersomnia, unspecified: Secondary | ICD-10-CM | POA: Diagnosis not present

## 2021-09-20 DIAGNOSIS — J449 Chronic obstructive pulmonary disease, unspecified: Secondary | ICD-10-CM | POA: Diagnosis not present

## 2021-09-20 NOTE — Patient Instructions (Addendum)
Continue Symbicort Continue oxygen therapy Assess for sleep apnea with home sleep study Represcribe flutter valve device

## 2021-09-20 NOTE — Progress Notes (Signed)
[facility] Consultation     Date: 09/20/2021,   MRN# [id number] Misty Ferguson 12-Aug-1934    AdmissionWeight: 96 lb (43.5 kg)                 CurrentWeight: 96 lb (43.5 kg)  Synopsis: Misty Ferguson was first seen by the [facility] pulmonary clinic in the summer of 2013. She has COPD. Simple spirometry performed in clinic showed an FEV1 of 45% predicted with clear obstruction.  She smoked 2 pack/day x 40 years, quit  17 years ago according to patient. She is a frequent exacerbation phenotype. She uses 2 L of oxygen continuously with exertion and at night   lling, mild erythema CC  Follow-up COPD Follow-up chronic hypoxic respiratory failure    HPI Chronic shortness of breath and dyspnea on exertion seems to be worsening over the last several years Patient on oxygen therapy 24/7 Patient needs this for survival  PATIENT DIAGNOSED WITH COVID February 11 2021. PATIENT HAS BEEN DECLINING SINCE THEN ALSO RAN OUT OF OXYGEN   No exacerbation at this time No evidence of heart failure at this time No evidence or signs of infection at this time No respiratory distress No fevers, chills, nausea, vomiting, diarrhea No evidence of lower extremity edema No evidence hemoptysis  Due to poor respiratory insufficiency patient has been started on nebulized therapy Patient continues to use oxygen  Patient diagnosed with bladder cancer sees urology     Patient is seen today for problems and issues with sleep related to excessive daytime sleepiness Patient  has been having sleep problems for many years Patient has been having excessive daytime sleepiness for a long time Patient has been having extreme fatigue and tiredness, lack of energy  Discussed sleep data and reviewed with patient.  Encouraged proper weight management.  Discussed driving precautions and its relationship with hypersomnolence.  Discussed operating dangerous equipment and its relationship with  hypersomnolence.  Discussed sleep hygiene, and benefits of a fixed sleep waked time.  The importance of getting eight or more hours of sleep discussed with patient.  Discussed limiting the use of the computer and television before bedtime.  Decrease naps during the day, so night time sleep will become enhanced.  Limit caffeine, and sleep deprivation.  HTN, stroke, and heart failure are potential risk factors.   Discussed risk of untreated sleep apnea including cardiac arrhthymias, stroke, DM, pulm HTN.    EPWORTH SLEEP SCORE   o signs of CHF or lower ext swelling at t ssues, has chCurrent Medication:   Current Outpatient Medications:    albuterol (PROVENTIL) (2.5 MG/3ML) 0.083% nebulizer solution, INHALE THE CONTENTS OF 1 VIAL VIA NEBULIZER EVERY 6 HOURS AS NEEDED FOR WHEEZING OR FOR SHORTNESS OF BREATH, Disp: 360 mL, Rfl: 3   ascorbic acid (VITAMIN C) 1000 MG tablet, Take 1,000 mg by mouth daily. , Disp: , Rfl:    budesonide-formoterol (SYMBICORT) 160-4.5 MCG/ACT inhaler, Inhale 2 puffs into the lungs in the morning and at bedtime., Disp: 3 each, Rfl: 0   Cholecalciferol (VITAMIN D) 125 MCG (5000 UT) CAPS, Take 5,000 Units by mouth daily., Disp: , Rfl:    docusate sodium (COLACE) 100 MG capsule, Take 100 mg by mouth 2 (two) times daily., Disp: , Rfl:    Fluticasone-Umeclidin-Vilant (TRELEGY ELLIPTA) 200-62.5-25 MCG/INH AEPB, Inhale 200 mcg into the lungs daily., Disp: 28 each, Rfl: 0   losartan (COZAAR) 25 MG tablet, 25 mg daily. , Disp: , Rfl:    Multiple  Vitamins-Minerals (PRESERVISION AREDS) TABS, Take 1 tablet by mouth 2 (two) times daily., Disp: , Rfl:    mupirocin ointment (BACTROBAN) 2 %, Apply 1 application topically daily. Apply to affected area and cover with band aide, Disp: 44 each, Rfl: 1   Omega-3 Fatty Acids (FISH OIL) 1000 MG CAPS, Take 1,000 mg by mouth 2 (two) times daily. , Disp: , Rfl:    OXYGEN, Inhale 4 L into the lungs as needed., Disp: , Rfl:    predniSONE  (DELTASONE) 20 MG tablet, 2 tabs po daily for 5 days, then 1 tab po daily for 5 days, Disp: 15 tablet, Rfl: 0   verapamil (VERELAN PM) 360 MG 24 hr capsule, Take 360 mg by mouth daily., Disp: , Rfl:    Vitamin E 180 MG CAPS, Take 180 mg by mouth daily., Disp: , Rfl:      ALLERGIES   Patient has no known allergies.     Review of Systems:  Gen:  Denies  fever, sweats, chills weight loss  HEENT: Denies blurred vision, double vision, ear pain, eye pain, hearing loss, nose bleeds, sore throat Cardiac:  No dizziness, chest pain or heaviness, chest tightness,edema, No JVD Resp:   + cough, -sputum production, +shortness of breath,+wheezing, -hemoptysis,  Other:  All other systems negative   BP 122/82 (BP Location: Left Arm, Patient Position: Sitting, Cuff Size: Normal)    Pulse 76    Temp (!) 97.1 F (36.2 C) (Oral)    Ht 5\' 1"  (1.549 m)    Wt 96 lb (43.5 kg)    SpO2 94%    BMI 18.14 kg/m    Physical Examination:   General Appearance: No distress  EYES PERRLA, EOM intact.   NECK Supple, No JVD Pulmonary: normal breath sounds, No wheezing.  CardiovascularNormal S1,S2.  No m/r/g.   Abdomen: Benign, Soft, non-tender. ALL OTHER ROS ARE NEGATIVE        ASSESSMENT/PLAN   88 year old white female seen today for follow-up assessment for Gold stage D end-stage severe COPD with chronic hypoxic respiratory failure with CT findings suggestive of severe emphysema and bronchiectasis with right lower lobe pleural-based nodule, patient with excessive daytime sleepiness findings may suggest underlying sleep apnea  She has unable to tolerate traditional inhaler therapy therefore she needs nebulized therapy due to her poor respiratory insufficiency I have asked the patient to bring her Symbicort inhaler next visit to assess proper usage At this time we will continue with Symbicort inhaler and will hold off on nebulized therapy at this time Patient is a very high risk for pulmonary  complications if she were to undergo any type of procedure especially bronchoscopy  End-stage COPD Nebulizers are way too expensive for patient Patient currently on Symbicort  Albuterol NEB as needed in conjunction with spirometry and flutter valve We will consider Spiriva Respimat at next visit  Chronic Hypoxic resp failure due to COPD -Patient benefits from oxygen therapy 2L [place]  -recommend using oxygen as prescribed -patient needs this for survival   Excessive daytime sleepiness obtain home sleep study   Right pleural-based nodule based on CT scan findings Patient does not want repeat CT chest to assess interval changes She does not want to pursue any further diagnostic testing She is happy with her life and does not want any type of work-up for this nodule    MEDICATION ADJUSTMENTS/LABS AND TESTS ORDERED: Continue Symbicort Continue oxygen therapy Assess for sleep apnea with home sleep study Represcribe flutter valve device  Follow-up  in 6 months  Total time spent 41 minutes    Corrin Parker, M.D.  [facility]  Medical Director [place] Director [facility] Cardio-Pulmonary Department

## 2021-10-02 DIAGNOSIS — J432 Centrilobular emphysema: Secondary | ICD-10-CM | POA: Diagnosis not present

## 2021-10-02 DIAGNOSIS — C670 Malignant neoplasm of trigone of bladder: Secondary | ICD-10-CM | POA: Diagnosis not present

## 2021-10-02 DIAGNOSIS — R0602 Shortness of breath: Secondary | ICD-10-CM | POA: Diagnosis not present

## 2021-10-02 DIAGNOSIS — I10 Essential (primary) hypertension: Secondary | ICD-10-CM | POA: Diagnosis not present

## 2021-10-02 DIAGNOSIS — R000 Tachycardia, unspecified: Secondary | ICD-10-CM | POA: Diagnosis not present

## 2021-10-02 DIAGNOSIS — J9611 Chronic respiratory failure with hypoxia: Secondary | ICD-10-CM | POA: Diagnosis not present

## 2021-10-17 ENCOUNTER — Ambulatory Visit: Payer: Medicare HMO

## 2021-10-17 ENCOUNTER — Telehealth: Payer: Self-pay | Admitting: Internal Medicine

## 2021-10-17 ENCOUNTER — Other Ambulatory Visit: Payer: Self-pay

## 2021-10-17 NOTE — Telephone Encounter (Signed)
Dr. Mortimer Fries this patient came into the office today to pick up the home sleep test machine. When she got here she was using 02. Home sleep test can't be done on 02. She states she has been using 02 for 5 years ?

## 2021-10-23 NOTE — Telephone Encounter (Signed)
Patient will need to do in lab sleep study and I don't think she would be willing ?

## 2021-10-30 NOTE — Telephone Encounter (Signed)
Spoke to patient and relayed message. She does not wish to proceed with sleep study at this time.  ?Nothing further needed. ? ?Routing to Dr. Mortimer Fries as an [place] ?

## 2021-10-30 NOTE — Telephone Encounter (Signed)
Dr. Kasa, please advise. Thanks °

## 2021-10-31 ENCOUNTER — Ambulatory Visit: Payer: Medicare HMO

## 2021-10-31 ENCOUNTER — Other Ambulatory Visit: Payer: Medicare HMO

## 2021-11-02 ENCOUNTER — Telehealth: Payer: Self-pay | Admitting: Family Medicine

## 2021-11-02 ENCOUNTER — Ambulatory Visit: Payer: Medicare HMO

## 2021-11-02 DIAGNOSIS — E7800 Pure hypercholesterolemia, unspecified: Secondary | ICD-10-CM

## 2021-11-02 DIAGNOSIS — R7303 Prediabetes: Secondary | ICD-10-CM

## 2021-11-02 NOTE — Telephone Encounter (Signed)
-----   Message from Ellamae Sia sent at 10/30/2021  4:17 PM EDT ----- ?Regarding: Lab orders for Tuesday, 11/07/21 ?Patient is scheduled for CPX labs, please order future labs, Thanks , Terri  ? ?

## 2021-11-03 ENCOUNTER — Encounter: Payer: Self-pay | Admitting: Emergency Medicine

## 2021-11-03 ENCOUNTER — Encounter: Payer: Self-pay | Admitting: Family Medicine

## 2021-11-03 ENCOUNTER — Other Ambulatory Visit: Payer: Self-pay

## 2021-11-03 ENCOUNTER — Ambulatory Visit
Admission: EM | Admit: 2021-11-03 | Discharge: 2021-11-03 | Disposition: A | Discharge status: Home / Self Care | Payer: Medicare HMO | Attending: Emergency Medicine | Admitting: Emergency Medicine

## 2021-11-03 DIAGNOSIS — L03115 Cellulitis of right lower limb: Secondary | ICD-10-CM | POA: Diagnosis not present

## 2021-11-03 DIAGNOSIS — M79671 Pain in right foot: Secondary | ICD-10-CM | POA: Diagnosis not present

## 2021-11-03 DIAGNOSIS — I739 Peripheral vascular disease, unspecified: Secondary | ICD-10-CM | POA: Diagnosis not present

## 2021-11-03 DIAGNOSIS — I10 Essential (primary) hypertension: Secondary | ICD-10-CM | POA: Diagnosis not present

## 2021-11-03 MED ORDER — CEPHALEXIN 500 MG PO CAPS: 500.0000 mg | ORAL_CAPSULE | Freq: Four times a day (QID) | ORAL | 0 refills | Status: DC

## 2021-11-03 MED ORDER — HYDROCODONE-ACETAMINOPHEN 5-325 MG PO TABS
1.0000 | ORAL_TABLET | ORAL | 0 refills | Status: AC | PRN
Start: 2021-11-03 — End: 2021-11-08

## 2021-11-03 NOTE — ED Provider Notes (Signed)
?[facility] ? ? ? ?CSN: [id number] ?Arrival date & time: 11/03/21  6578 ? ? ?  ? ?History   ?Chief Complaint ?Chief Complaint  ?Patient presents with  ? Foot Pain  ? ? ?HPI ?Misty Ferguson is a 88 y.o. female.  Accompanied by her husband, patient presents with bilateral heel pain, worse in the right foot x1 week.  No falls or injury.  The right heel pain became so bad during the night that she could not sleep; she took one of her husbands hydrocodone which helped some.  She is unable to bear weight on her right heel due to the pain.  No fever, chills, open wounds, drainage, or other symptoms.  She has history of peripheral neuropathy.  Her medical history includes hypertension, diabetes, emphysema, chronic respiratory failure, peripheral neuropathy, bladder cancer. ? ?The history is provided by the patient, the spouse and medical records.  ? ?Past Medical History:  ?Diagnosis Date  ? AK (actinic keratosis) 12/08/2020  ? right pretibia inferior bx proven, LN2 01/10/21  ? Burping   ? Chronic airway obstruction, not elsewhere classified   ? Dyspnea   ? Dysrhythmia   ? Macular degeneration (senile) of retina, unspecified   ? Malignant neoplasm of urinary bladder ([place]) 03/2019  ? Partial bladder resection and Rad tx's.   ? Obstructive chronic bronchitis with exacerbation ([place])   ? Osteoporosis, unspecified   ? Oxygen deficiency   ? Personal history of peptic ulcer disease   ? SCC (squamous cell carcinoma) 12/08/2020  ? right pretibia superior, [facility] 01/10/21  ? Tobacco use disorder   ? Unspecified essential hypertension   ? Unspecified glaucoma(365.9)   ? ? ?Patient Active Problem List  ? Diagnosis Date Noted  ? COVID-19 virus infection 04/21/2021  ? Skin lesions 11/08/2020  ? Goals of care, counseling/discussion 06/14/2019  ? History of bladder cancer 06/08/2019  ? Constipation 04/25/2019  ? Sinus tachycardia 10/24/2018  ? Senile purpura ([place]) 10/04/2016  ? Peripheral neuropathy 07/24/2016  ? Chronic hypoxemic  respiratory failure ([place]) 08/25/2012  ? Hyperkalemia 05/04/2009  ? HYPERCHOLESTEROLEMIA 03/24/2008  ? Prediabetes 12/11/2006  ? MACULAR DEGENERATION 12/10/2006  ? Essential hypertension, benign 12/10/2006  ? Centrilobular emphysema ([place]) 12/10/2006  ? Osteoporosis 12/10/2006  ? ? ?Past Surgical History:  ?Procedure Laterality Date  ? CATARACT EXTRACTION W/ INTRAOCULAR LENS  IMPLANT, BILATERAL    ? CYSTOSCOPY WITH STENT PLACEMENT Left 05/22/2019  ? Procedure: CYSTOSCOPY WITH STENT PLACEMENT;  Surgeon: Billey Co, MD;  Location: [facility] ORS;  Service: Urology;  Laterality: Left;  ? ELBOW FRACTURE SURGERY Left   ? TRANSURETHRAL RESECTION OF BLADDER TUMOR N/A 05/22/2019  ? Procedure: TRANSURETHRAL RESECTION OF BLADDER TUMOR (TURBT);  Surgeon: Billey Co, MD;  Location: [facility] ORS;  Service: Urology;  Laterality: N/A;  ? ? ?OB History   ?No obstetric history on file. ?  ? ? ? ?Home Medications   ? ?Prior to Admission medications   ?Medication Sig Start Date End Date Taking? Authorizing Provider  ?albuterol (PROVENTIL) (2.5 MG/3ML) 0.083% nebulizer solution INHALE THE CONTENTS OF 1 VIAL VIA NEBULIZER EVERY 6 HOURS AS NEEDED FOR WHEEZING OR FOR SHORTNESS OF BREATH 10/24/20  Yes Flora Lipps, MD  ?ascorbic acid (VITAMIN C) 1000 MG tablet Take 1,000 mg by mouth daily.    Yes [provider]  ?budesonide-formoterol (SYMBICORT) 160-4.5 MCG/ACT inhaler Inhale 2 puffs into the lungs in the morning and at bedtime. 12/13/20  Yes Flora Lipps, MD  ?cephALEXin (KEFLEX) 500 MG  capsule Take 1 capsule (500 mg total) by mouth 4 (four) times daily. 11/03/21  Yes Sharion Balloon, NP  ?Cholecalciferol (VITAMIN D) 125 MCG (5000 UT) CAPS Take 5,000 Units by mouth daily.   Yes [provider]  ?docusate sodium (COLACE) 100 MG capsule Take 100 mg by mouth 2 (two) times daily.   Yes [provider]  ?HYDROcodone-acetaminophen (NORCO/VICODIN) 5-325 MG tablet Take 1 tablet by mouth every 4 (four) hours as needed for up  to 5 days. 11/03/21 11/08/21 Yes Sharion Balloon, NP  ?losartan (COZAAR) 25 MG tablet 25 mg daily.  08/25/19  Yes [provider]  ?Omega-3 Fatty Acids (FISH OIL) 1000 MG CAPS Take 1,000 mg by mouth 2 (two) times daily.    Yes [provider]  ?OXYGEN Inhale 4 L into the lungs as needed.   Yes [provider]  ?verapamil (VERELAN PM) 360 MG 24 hr capsule Take 360 mg by mouth daily. 10/20/19  Yes [provider]  ?Vitamin E 180 MG CAPS Take 180 mg by mouth daily.   Yes [provider]  ?Fluticasone-Umeclidin-Vilant (TRELEGY ELLIPTA) 200-62.5-25 MCG/INH AEPB Inhale 200 mcg into the lungs daily. 03/14/21   Flora Lipps, MD  ?Multiple Vitamins-Minerals (PRESERVISION AREDS) TABS Take 1 tablet by mouth 2 (two) times daily.    [provider]  ?mupirocin ointment (BACTROBAN) 2 % Apply 1 application topically daily. Apply to affected area and cover with band aide 01/10/21   Moye, [place], MD  ?predniSONE (DELTASONE) 20 MG tablet 2 tabs po daily for 5 days, then 1 tab po daily for 5 days 07/10/21   Owens Loffler, MD  ? ? ?Family History ?Family History  ?Adopted: Yes  ? ? ?Social History ?Social History  ? ?Tobacco Use  ? Smoking status: Former  ?  Packs/day: 1.30  ?  Years: 45.00  ?  Pack years: 58.50  ?  Types: Cigarettes  ?  Quit date: 08/14/2003  ?  Years since quitting: 18.2  ? Smokeless tobacco: Never  ?Vaping Use  ? Vaping Use: Never used  ?Substance Use Topics  ? Alcohol use: Yes  ?  Alcohol/week: 14.0 standard drinks  ?  Types: 14 Glasses of wine per week  ?  Comment: 1-2 wine daily  ? Drug use: No  ? ? ? ?Allergies   ?Patient has no known allergies. ? ? ?Review of Systems ?Review of Systems  ?Constitutional:  Negative for chills and fever.  ?Respiratory:  Negative for cough and shortness of breath.   ?Cardiovascular:  Negative for chest pain and palpitations.  ?Musculoskeletal:  Positive for arthralgias and gait problem.  ?Skin:  Positive for color change and wound.   ?All other systems reviewed and are negative. ? ? ?Physical Exam ?Triage Vital Signs ?ED Triage Vitals  ?Enc Vitals Group  ?   BP   ?   Pulse   ?   Resp   ?   Temp   ?   Temp src   ?   SpO2   ?   Weight   ?   Height   ?   Head Circumference   ?   Peak Flow   ?   Pain Score   ?   Pain Loc   ?   Pain Edu?   ?   Excl. in [place]?   ? ?No data found. ? ?Updated Vital Signs ?BP (!) 154/77   Pulse 83   Temp 98.9 ?F (37.2 ?C)  Resp 20   SpO2 95%  ? ?Visual Acuity ?Right Eye Distance:   ?Left Eye Distance:   ?Bilateral Distance:   ? ?Right Eye Near:   ?Left Eye Near:    ?Bilateral Near:    ? ?Physical Exam ?Vitals and nursing note reviewed.  ?Constitutional:   ?   General: She is not in acute distress. ?   Appearance: She is well-developed.  ?HENT:  ?   Mouth/Throat:  ?   Mouth: Mucous membranes are moist.  ?Cardiovascular:  ?   Rate and Rhythm: Normal rate and regular rhythm.  ?   Heart sounds: Normal heart sounds.  ?Pulmonary:  ?   Effort: Pulmonary effort is normal. No respiratory distress.  ?   Breath sounds: Normal breath sounds.  ?Musculoskeletal:     ?   General: Tenderness present. No swelling or deformity. Normal range of motion.  ?   Cervical back: Neck supple.  ?Skin: ?   General: Skin is warm and dry.  ?   Capillary Refill: Capillary refill takes less than 2 seconds.  ?   Findings: Bruising and erythema present.  ?   Comments: Right heel is acutely tender to palpation.  The skin is dark purple with some light erythema surrounding.  See pictures for details.  ?Neurological:  ?   General: No focal deficit present.  ?   Mental Status: She is alert and oriented to person, place, and time.  ?   Sensory: No sensory deficit.  ?   Motor: No weakness.  ?   Gait: Gait abnormal.  ?   Comments: In wheelchair.   ?Psychiatric:     ?   Mood and Affect: Mood normal.     ?   Behavior: Behavior normal.  ? ? ? ? ? ? ? ? ?UC Treatments / Results  ?Labs ?(all labs ordered are listed, but only abnormal results are displayed) ?Labs  Reviewed - No data to display ? ?EKG ? ? ?Radiology ?No results found. ? ?Procedures ?Procedures (including critical care time) ? ?Medications Ordered in UC ?Medications - No data to display ? ?Initial Impression

## 2021-11-03 NOTE — Discharge Instructions (Addendum)
Take the antibiotic as directed.   ? ?Take the hydrocodone-acetaminophen as needed for pain.  Do not drive, operate machinery, or drink alcohol with this medication as it may cause drowsiness.  ? ?Follow-up with your primary care provider on Monday.  ? ?Your blood pressure is elevated today at 170/76; repeat 154/77.  Please have this rechecked by your primary care provider in 2-4 weeks.     ? ?

## 2021-11-03 NOTE — ED Triage Notes (Signed)
Pt presents with heel pain x 1 week. Pt states the pain has gotten worse and is radiating up into her legs.  ?

## 2021-11-06 ENCOUNTER — Ambulatory Visit (INDEPENDENT_AMBULATORY_CARE_PROVIDER_SITE_OTHER): Payer: Medicare HMO | Admitting: Family Medicine

## 2021-11-06 ENCOUNTER — Encounter: Payer: Self-pay | Admitting: Family Medicine

## 2021-11-06 ENCOUNTER — Telehealth (INDEPENDENT_AMBULATORY_CARE_PROVIDER_SITE_OTHER): Payer: Self-pay

## 2021-11-06 ENCOUNTER — Other Ambulatory Visit: Payer: Self-pay

## 2021-11-06 VITALS — BP 158/70 | HR 84 | Temp 97.8°F | Ht 61.0 in | Wt 96.0 lb

## 2021-11-06 DIAGNOSIS — L03115 Cellulitis of right lower limb: Secondary | ICD-10-CM | POA: Diagnosis not present

## 2021-11-06 DIAGNOSIS — R7303 Prediabetes: Secondary | ICD-10-CM

## 2021-11-06 DIAGNOSIS — I10 Essential (primary) hypertension: Secondary | ICD-10-CM

## 2021-11-06 DIAGNOSIS — E7800 Pure hypercholesterolemia, unspecified: Secondary | ICD-10-CM | POA: Diagnosis not present

## 2021-11-06 DIAGNOSIS — R0989 Other specified symptoms and signs involving the circulatory and respiratory systems: Secondary | ICD-10-CM

## 2021-11-06 LAB — LIPID PANEL
Cholesterol: 249 mg/dL — ABNORMAL HIGH (ref 0–200)
HDL: 128.3 mg/dL (ref >39.00)
LDL Cholesterol: 104 mg/dL — ABNORMAL HIGH (ref 0–99)
NonHDL: 120.23
Total CHOL/HDL Ratio: 2
Triglycerides: 82 mg/dL (ref 0.0–149.0)
VLDL: 16.4 mg/dL (ref 0.0–40.0)

## 2021-11-06 LAB — CBC
HCT: 39 % (ref 36.0–46.0)
Hemoglobin: 13 g/dL (ref 12.0–15.0)
MCHC: 33.4 g/dL (ref 30.0–36.0)
MCV: 93.9 fl (ref 78.0–100.0)
Platelets: 220 10*3/uL (ref 150.0–400.0)
RBC: 4.16 Mil/uL (ref 3.87–5.11)
RDW: 12.9 % (ref 11.5–15.5)
WBC: 5.6 10*3/uL (ref 4.0–10.5)

## 2021-11-06 LAB — COMPREHENSIVE METABOLIC PANEL
ALT: 33 U/L (ref 0–35)
AST: 27 U/L (ref 0–37)
Albumin: 4.8 g/dL (ref 3.5–5.2)
Alkaline Phosphatase: 66 U/L (ref 39–117)
BUN: 21 mg/dL (ref 6–23)
CO2: 32 mEq/L (ref 19–32)
Calcium: 9.8 mg/dL (ref 8.4–10.5)
Chloride: 103 mEq/L (ref 96–112)
Creatinine, Ser: 0.83 mg/dL (ref 0.40–1.20)
GFR: 64.28 mL/min (ref >60.00)
Glucose, Bld: 108 mg/dL — ABNORMAL HIGH (ref 70–99)
Potassium: 4.9 mEq/L (ref 3.5–5.1)
Sodium: 142 mEq/L (ref 135–145)
Total Bilirubin: 0.5 mg/dL (ref 0.2–1.2)
Total Protein: 6.6 g/dL (ref 6.0–8.3)

## 2021-11-06 LAB — HEMOGLOBIN A1C: Hgb A1c MFr Bld: 5.9 % (ref 4.6–6.5)

## 2021-11-06 NOTE — Patient Instructions (Addendum)
Blood pressure ?- Goal <140/90 ?- update via mychart or call the clinic if elevated to consider changing medication ? ?Heels ?- working on referral and imaging ?- you will get a phone call  ?

## 2021-11-06 NOTE — Progress Notes (Signed)
? ?Subjective:  ? ?  ?Misty Ferguson is a 88 y.o. female presenting for Follow-up (UC visit ) ?  ? ? ?HPI ? ?#Cellulitis ?- went to UC on 11/03/2021 ?- started antibiotics ?- feet are painful ?- very cold ?- she cannot see due to vision changes ?- husband notes heels were red and not cold ?- now they are purple ?- walks around at home with 02 and 4L  ?- does not walk around outside  ?- no fever/chills ?- right >>>left ?- does not check bp at home ?- endorses significant pain ?- has been walking on her toes - cannot put any weight on her feet ? ? ?#HTN ?- gets anxious about her O2 ? ? ?Review of Systems ? ?11/03/2021: UC - elevated BP and cellulitis. Bruising. Keflex and return for re-check ? ?Social History  ? ?Tobacco Use  ?Smoking Status Former  ? Packs/day: 1.30  ? Years: 45.00  ? Pack years: 58.50  ? Types: Cigarettes  ? Quit date: 08/14/2003  ? Years since quitting: 18.2  ?Smokeless Tobacco Never  ? ? ? ?   ?Objective:  ?  ?BP Readings from Last 3 Encounters:  ?11/06/21 (!) 158/70  ?11/03/21 (!) 154/77  ?09/20/21 122/82  ? ?Wt Readings from Last 3 Encounters:  ?11/06/21 96 lb (43.5 kg)  ?09/20/21 96 lb (43.5 kg)  ?07/10/21 97 lb (44 kg)  ? ? ?BP (!) 158/70   Pulse 84   Temp 97.8 ?F (36.6 ?C) (Oral)   Ht '5\' 1"'$  (1.549 m)   Wt 96 lb (43.5 kg)   SpO2 95%   BMI 18.14 kg/m?  ? ? ?Physical Exam ?Constitutional:   ?   General: She is not in acute distress. ?   Appearance: She is well-developed. She is not diaphoretic.  ?   Comments: Very thin  ?HENT:  ?   Right Ear: External ear normal.  ?   Left Ear: External ear normal.  ?   Nose: Nose normal.  ?Eyes:  ?   Conjunctiva/sclera: Conjunctivae normal.  ?Cardiovascular:  ?   Rate and Rhythm: Normal rate and regular rhythm.  ?   Pulses:     ?     Dorsalis pedis pulses are 2+ on the right side and 2+ on the left side.  ?     Posterior tibial pulses are 0 on the right side and 0 on the left side.  ?   Heart sounds: Heart sounds are distant.  ?Pulmonary:  ?   Effort:  Pulmonary effort is normal.  ?Musculoskeletal:  ?   Cervical back: Neck supple.  ?Skin: ?   General: Skin is warm and dry.  ?   Capillary Refill: Capillary refill takes less than 2 seconds.  ?   Comments: B/l legs with purpura and brown discoloration. Right heel with erythema and purpura, cool to the touch. No hair on b/l feet.   ?Neurological:  ?   Mental Status: She is alert. Mental status is at baseline.  ?Psychiatric:     ?   Mood and Affect: Mood normal.     ?   Behavior: Behavior normal.  ? ? ? ? ? ?   ?Assessment & Plan:  ? ?Problem List Items Addressed This Visit   ? ?  ? Cardiovascular and Mediastinum  ? Essential hypertension, benign  ?  Suspect elevation 2/2 to not using oxygen, anxiety, and pain. Advised home monitoring and update if elevated at home. Cont losartan 25 mg,  verapamil 360 mg.  ?  ?  ? Relevant Orders  ? CBC  ?  ? Other  ? Decreased pulses in feet - Primary  ?  With cold extremities and difficulty palpating pulses. Switch vascular referral to STAT. Order doppler US to evaluate for blood flow.   ?  ?  ? Relevant Orders  ? VAS [place] LE ART SEG MULTI (Segm&LE [place])  ? ?Other Visit Diagnoses   ? ? Cellulitis of right lower extremity      ? ?  ? ?She notes when she went to UC the heel was hot.  Erythema seems stable, but cool to touch. Lower suspicion for infection, however, as it was warm on presentation will continue Keflex as prescribed.  ? ?Return if symptoms worsen or fail to improve. ? ?Lesleigh Noe, MD ? ?This visit occurred during the SARS-CoV-2 public health emergency.  Safety protocols were in place, including screening questions prior to the visit, additional usage of staff PPE, and extensive cleaning of exam room while observing appropriate contact time as indicated for disinfecting solutions.  ? ?

## 2021-11-06 NOTE — Assessment & Plan Note (Signed)
Suspect elevation 2/2 to not using oxygen, anxiety, and pain. Advised home monitoring and update if elevated at home. Cont losartan 25 mg, verapamil 360 mg.  ?

## 2021-11-06 NOTE — Telephone Encounter (Signed)
Spoke with  - they stated they placed an urgent referral on Friday and then placed an urgent US order this morning. Advised that I would call pt. I LVM for pt TCB and schedule appt. IF we can get her in for an abi + consult anytime soon, please schedule that. If not, please schedule just the abi at the earliest convenience,   ?

## 2021-11-06 NOTE — Assessment & Plan Note (Signed)
With cold extremities and difficulty palpating pulses. Switch vascular referral to STAT. Order doppler US to evaluate for blood flow.   ?

## 2021-11-07 ENCOUNTER — Other Ambulatory Visit: Payer: Medicare HMO

## 2021-11-07 ENCOUNTER — Ambulatory Visit (INDEPENDENT_AMBULATORY_CARE_PROVIDER_SITE_OTHER): Payer: Medicare HMO

## 2021-11-07 ENCOUNTER — Ambulatory Visit: Payer: Medicare HMO

## 2021-11-07 VITALS — Wt 95.0 lb

## 2021-11-07 DIAGNOSIS — R0989 Other specified symptoms and signs involving the circulatory and respiratory systems: Secondary | ICD-10-CM

## 2021-11-07 DIAGNOSIS — Z0000 Encounter for general adult medical examination without abnormal findings: Secondary | ICD-10-CM | POA: Diagnosis not present

## 2021-11-07 NOTE — Patient Instructions (Signed)
Ms. Pillard , ?Thank you for taking time to come for your Medicare Wellness Visit. I appreciate your ongoing commitment to your health goals. Please review the following plan we discussed and let me know if I can assist you in the future.  ? ?Screening recommendations/referrals: ?Colonoscopy: No longer required ?Mammogram: Done 2009 - no longer required ?Bone Density: Done 2017 - no longer required - you may still repeat this every 2 years ?Recommended yearly ophthalmology/optometry visit for glaucoma screening and checkup ?Recommended yearly dental visit for hygiene and checkup ? ?Vaccinations: ?Influenza vaccine: Done 04/28/2021 - Repeat annually ?Pneumococcal vaccine: Done 06/28/2011 & 09/21/2014 ?Tdap vaccine: Done 10/04/2016 - Repeat in 10 years ?Shingles vaccine: Done 11/08/2020 & 01/19/2021   ?Covid-19: Declined ? ?Advanced directives: Please bring a copy of your health care power of attorney and living will to the office to be added to your chart at your convenience.  ? ?Conditions/risks identified: Aim for 10 minutes of stretching and chair/ balance exercises, 6-8 glasses of water, and 5 servings of fruits and vegetables each day.  ? ?Next appointment: Follow up in one year for your annual wellness visit  ? ? ?Preventive Care 32 Years and Older, Female ?Preventive care refers to lifestyle choices and visits with your health care provider that can promote health and wellness. ?What does preventive care include? ?A yearly physical exam. This is also called an annual well check. ?Dental exams once or twice a year. ?Routine eye exams. Ask your health care provider how often you should have your eyes checked. ?Personal lifestyle choices, including: ?Daily care of your teeth and gums. ?Regular physical activity. ?Eating a healthy diet. ?Avoiding tobacco and drug use. ?Limiting alcohol use. ?Practicing safe sex. ?Taking low-dose aspirin every day. ?Taking vitamin and mineral supplements as recommended by your health care  provider. ?What happens during an annual well check? ?The services and screenings done by your health care provider during your annual well check will depend on your age, overall health, lifestyle risk factors, and family history of disease. ?Counseling  ?Your health care provider may ask you questions about your: ?Alcohol use. ?Tobacco use. ?Drug use. ?Emotional well-being. ?Home and relationship well-being. ?Sexual activity. ?Eating habits. ?History of falls. ?Memory and ability to understand (cognition). ?Work and work Statistician. ?Reproductive health. ?Screening  ?You may have the following tests or measurements: ?Height, weight, and BMI. ?Blood pressure. ?Lipid and cholesterol levels. These may be checked every 5 years, or more frequently if you are over 33 years old. ?Skin check. ?Lung cancer screening. You may have this screening every year starting at age 86 if you have a 30-pack-year history of smoking and currently smoke or have quit within the past 15 years. ?Fecal occult blood test (FOBT) of the stool. You may have this test every year starting at age 79. ?Flexible sigmoidoscopy or colonoscopy. You may have a sigmoidoscopy every 5 years or a colonoscopy every 10 years starting at age 88. ?Hepatitis C blood test. ?Hepatitis B blood test. ?Sexually transmitted disease (STD) testing. ?Diabetes screening. This is done by checking your blood sugar (glucose) after you have not eaten for a while (fasting). You may have this done every 1-3 years. ?Bone density scan. This is done to screen for osteoporosis. You may have this done starting at age 54. ?Mammogram. This may be done every 1-2 years. Talk to your health care provider about how often you should have regular mammograms. ?Talk with your health care provider about your test results, treatment options, and  if necessary, the need for more tests. ?Vaccines  ?Your health care provider may recommend certain vaccines, such as: ?Influenza vaccine. This is  recommended every year. ?Tetanus, diphtheria, and acellular pertussis (Tdap, Td) vaccine. You may need a Td booster every 10 years. ?Zoster vaccine. You may need this after age 38. ?Pneumococcal 13-valent conjugate (PCV13) vaccine. One dose is recommended after age 23. ?Pneumococcal polysaccharide (PPSV23) vaccine. One dose is recommended after age 46. ?Talk to your health care provider about which screenings and vaccines you need and how often you need them. ?This information is not intended to replace advice given to you by your health care provider. Make sure you discuss any questions you have with your health care provider. ?Document Released: 08/26/2015 Document Revised: 04/18/2016 Document Reviewed: 05/31/2015 ?Elsevier Interactive Patient Education ? 2017 [place]. ? ?Fall Prevention in the Home ?Falls can cause injuries. They can happen to people of all ages. There are many things you can do to make your home safe and to help prevent falls. ?What can I do on the outside of my home? ?Regularly fix the edges of walkways and driveways and fix any cracks. ?Remove anything that might make you trip as you walk through a door, such as a raised step or threshold. ?Trim any bushes or trees on the path to your home. ?Use bright outdoor lighting. ?Clear any walking paths of anything that might make someone trip, such as rocks or tools. ?Regularly check to see if handrails are loose or broken. Make sure that both sides of any steps have handrails. ?Any raised decks and porches should have guardrails on the edges. ?Have any leaves, snow, or ice cleared regularly. ?Use sand or salt on walking paths during winter. ?Clean up any spills in your garage right away. This includes oil or grease spills. ?What can I do in the bathroom? ?Use night lights. ?Install grab bars by the toilet and in the tub and shower. Do not use towel bars as grab bars. ?Use non-skid mats or decals in the tub or shower. ?If you need to sit down in  the shower, use a plastic, non-slip stool. ?Keep the floor dry. Clean up any water that spills on the floor as soon as it happens. ?Remove soap buildup in the tub or shower regularly. ?Attach bath mats securely with double-sided non-slip rug tape. ?Do not have throw rugs and other things on the floor that can make you trip. ?What can I do in the bedroom? ?Use night lights. ?Make sure that you have a light by your bed that is easy to reach. ?Do not use any sheets or blankets that are too big for your bed. They should not hang down onto the floor. ?Have a firm chair that has side arms. You can use this for support while you get dressed. ?Do not have throw rugs and other things on the floor that can make you trip. ?What can I do in the kitchen? ?Clean up any spills right away. ?Avoid walking on wet floors. ?Keep items that you use a lot in easy-to-reach places. ?If you need to reach something above you, use a strong step stool that has a grab bar. ?Keep electrical cords out of the way. ?Do not use floor polish or wax that makes floors slippery. If you must use wax, use non-skid floor wax. ?Do not have throw rugs and other things on the floor that can make you trip. ?What can I do with my stairs? ?Do not leave any  items on the stairs. ?Make sure that there are handrails on both sides of the stairs and use them. Fix handrails that are broken or loose. Make sure that handrails are as long as the stairways. ?Check any carpeting to make sure that it is firmly attached to the stairs. Fix any carpet that is loose or worn. ?Avoid having throw rugs at the top or bottom of the stairs. If you do have throw rugs, attach them to the floor with carpet tape. ?Make sure that you have a light switch at the top of the stairs and the bottom of the stairs. If you do not have them, ask someone to add them for you. ?What else can I do to help prevent falls? ?Wear shoes that: ?Do not have high heels. ?Have rubber bottoms. ?Are comfortable  and fit you well. ?Are closed at the toe. Do not wear sandals. ?If you use a stepladder: ?Make sure that it is fully opened. Do not climb a closed stepladder. ?Make sure that both sides of the stepladder are l

## 2021-11-07 NOTE — Progress Notes (Signed)
No critical labs need to be addressed urgently. We will discuss labs in detail at upcoming office visit.   

## 2021-11-07 NOTE — Progress Notes (Signed)
? ?Subjective:  ? Misty Ferguson is a 88 y.o. female who presents for Medicare Annual (Subsequent) preventive examination. ? ?Virtual Visit via Telephone Note ? ?I connected with  Misty Ferguson on 11/07/21 at  9:45 AM EDT by telephone and verified that I am speaking with the correct person using two identifiers. ? ?Location: ?Patient: Home ?Provider: [facility] ?Persons participating in the virtual visit: patient/Nurse Health Advisor ?  ?I discussed the limitations, risks, security and privacy concerns of performing an evaluation and management service by telephone and the availability of in person appointments. The patient expressed understanding and agreed to proceed. ? ?Interactive audio and video telecommunications were attempted between this nurse and patient, however failed, due to patient having technical difficulties OR patient did not have access to video capability.  We continued and completed visit with audio only. ? ?Some vital signs may be absent or patient reported.  ? ?Misty Pancoast Dionne Ano, LPN  ? ?Review of Systems    ? ?Cardiac Risk Factors include: advanced age (>56mn, >>44women);sedentary lifestyle;hypertension;dyslipidemia;Other (see comment), Risk factor comments: emphysema, chronic respiratory failure, underweight ? ?   ?Objective:  ?  ?Today's Vitals  ? 11/07/21 0944  ?Weight: 95 lb (43.1 kg)  ?PainSc: 8   ? ?Body mass index is 17.95 kg/m?. ? ? ?  11/07/2021  ?  9:55 AM 11/01/2020  ?  9:53 AM 10/28/2019  ?  9:49 AM 06/08/2019  ? 10:58 AM 05/18/2019  ?  2:00 PM 11/05/2017  ?  4:30 PM 10/23/2017  ?  4:38 AM  ?Advanced Directives  ?Does Patient Have a Medical Advance Directive? Yes Yes Yes Yes Yes Yes   ?Type of [place] will Living will;Healthcare Power of Attorney Living will;Healthcare Power of Attorney  Out of facility DNR (pink MOST or yellow form)  ?Does patient want  to make changes to medical advance directive?      No - Patient declined   ?Copy of [place]? No - copy requested No - copy requested No - copy requested      ? ? ?Current Medications (verified) ?Outpatient Encounter Medications as of 11/07/2021  ?Medication Sig  ? albuterol (PROVENTIL) (2.5 MG/3ML) 0.083% nebulizer solution INHALE THE CONTENTS OF 1 VIAL VIA NEBULIZER EVERY 6 HOURS AS NEEDED FOR WHEEZING OR FOR SHORTNESS OF BREATH  ? ascorbic acid (VITAMIN C) 1000 MG tablet Take 1,000 mg by mouth daily.   ? budesonide-formoterol (SYMBICORT) 160-4.5 MCG/ACT inhaler Inhale 2 puffs into the lungs in the morning and at bedtime.  ? cephALEXin (KEFLEX) 500 MG capsule Take 1 capsule (500 mg total) by mouth 4 (four) times daily.  ? Cholecalciferol (VITAMIN D) 125 MCG (5000 UT) CAPS Take 5,000 Units by mouth daily.  ? docusate sodium (COLACE) 100 MG capsule Take 100 mg by mouth 2 (two) times daily.  ? losartan (COZAAR) 25 MG tablet 25 mg daily.   ? Multiple Vitamins-Minerals (PRESERVISION AREDS) TABS Take 1 tablet by mouth 2 (two) times daily.  ? Omega-3 Fatty Acids (FISH OIL) 1000 MG CAPS Take 1,000 mg by mouth 2 (two) times daily.   ? OXYGEN Inhale 4 L into the lungs as needed.  ? verapamil (VERELAN PM) 360 MG 24 hr capsule Take 360 mg by mouth daily.  ? Vitamin E 180 MG CAPS Take 180 mg by mouth daily.  ? HYDROcodone-acetaminophen (NORCO/VICODIN) 5-325 MG tablet  Take 1 tablet by mouth every 4 (four) hours as needed for up to 5 days. (Patient not taking: Reported on 11/06/2021)  ? ?No facility-administered encounter medications on file as of 11/07/2021.  ? ? ?Allergies (verified) ?Patient has no known allergies.  ? ?History: ?Past Medical History:  ?Diagnosis Date  ? AK (actinic keratosis) 12/08/2020  ? right pretibia inferior bx proven, LN2 01/10/21  ? Burping   ? Chronic airway obstruction, not elsewhere classified   ? Dyspnea   ? Dysrhythmia   ? Macular degeneration (senile) of retina,  unspecified   ? Malignant neoplasm of urinary bladder ([place]) 03/2019  ? Partial bladder resection and Rad tx's.   ? Obstructive chronic bronchitis with exacerbation ([place])   ? Osteoporosis, unspecified   ? Oxygen deficiency   ? Personal history of peptic ulcer disease   ? SCC (squamous cell carcinoma) 12/08/2020  ? right pretibia superior, [facility] 01/10/21  ? Tobacco use disorder   ? Unspecified essential hypertension   ? Unspecified glaucoma(365.9)   ? ?Past Surgical History:  ?Procedure Laterality Date  ? CATARACT EXTRACTION W/ INTRAOCULAR LENS  IMPLANT, BILATERAL    ? CYSTOSCOPY WITH STENT PLACEMENT Left 05/22/2019  ? Procedure: CYSTOSCOPY WITH STENT PLACEMENT;  Surgeon: Billey Co, MD;  Location: [facility] ORS;  Service: Urology;  Laterality: Left;  ? ELBOW FRACTURE SURGERY Left   ? TRANSURETHRAL RESECTION OF BLADDER TUMOR N/A 05/22/2019  ? Procedure: TRANSURETHRAL RESECTION OF BLADDER TUMOR (TURBT);  Surgeon: Billey Co, MD;  Location: [facility] ORS;  Service: Urology;  Laterality: N/A;  ? ?Family History  ?Adopted: Yes  ? ?Social History  ? ?Socioeconomic History  ? Marital status: Married  ?  Spouse name: Not on file  ? Number of children: 3  ? Years of education: Not on file  ? Highest education level: Not on file  ?Occupational History  ? Occupation: RETIRED  ?  Employer: RETIRED  ?Tobacco Use  ? Smoking status: Former  ?  Packs/day: 1.30  ?  Years: 45.00  ?  Pack years: 58.50  ?  Types: Cigarettes  ?  Quit date: 08/14/2003  ?  Years since quitting: 18.2  ? Smokeless tobacco: Never  ?Vaping Use  ? Vaping Use: Never used  ?Substance and Sexual Activity  ? Alcohol use: Yes  ?  Alcohol/week: 14.0 standard drinks  ?  Types: 14 Glasses of wine per week  ?  Comment: 1-2 wine daily  ? Drug use: No  ? Sexual activity: Yes  ?  Birth control/protection: Post-menopausal  ?Other Topics Concern  ? Not on file  ?Social History Narrative  ? Tobacco abuse x 45 years  ? ?Social Determinants of Health  ? ?Financial Resource Strain:  Low Risk   ? Difficulty of Paying Living Expenses: Not very hard  ?Food Insecurity: No Food Insecurity  ? Worried About Charity fundraiser in the Last Year: Never true  ? Ran Out of Food in the Last Year: Never true  ?Transportation Needs: No Transportation Needs  ? Lack of Transportation (Medical): No  ? Lack of Transportation (Non-Medical): No  ?Physical Activity: Insufficiently Active  ? Days of Exercise per Week: 7 days  ? Minutes of Exercise per Session: 10 min  ?Stress: No Stress Concern Present  ? Feeling of Stress : Only a little  ?Social Connections: Moderately Isolated  ? Frequency of Communication with Friends and Family: More than three times a week  ? Frequency of Social Gatherings with Friends and  Family: More than three times a week  ? Attends Religious Services: Never  ? Active Member of Clubs or Organizations: No  ? Attends Archivist Meetings: Never  ? Marital Status: Married  ? ? ?Tobacco Counseling ?Counseling given: Not Answered ? ? ?Clinical Intake: ? ?Pre-visit preparation completed: Yes ? ?Pain : 0-10 ?Pain Score: 8  ?Pain Type: Neuropathic pain ?Pain Location: Foot ?Pain Orientation: Right, Left ?Pain Descriptors / Indicators: Aching, Discomfort, Sore ?Pain Onset: More than a month ago ?Pain Frequency: Intermittent ? ?  ? ?BMI - recorded: 17.95 ?Nutritional Status: BMI <19  Underweight ?Nutritional Risks: None ?Diabetes: No ? ?How often do you need to have someone help you when you read instructions, pamphlets, or other written materials from your doctor or pharmacy?: 1 - Never ? ?Diabetic? no ? ?Interpreter Needed?: No ? ?Information entered by :: Elmond Poehlman, LPN ? ? ?Activities of Daily Living ? ?  11/07/2021  ?  9:49 AM  ?In your present state of health, do you have any difficulty performing the following activities:  ?Hearing? 1  ?Comment declines hearing aids  ?Vision? 0  ?Difficulty concentrating or making decisions? 0  ?Walking or climbing stairs? 1  ?Dressing or bathing? 0   ?Doing errands, shopping? 1  ?Comment husband drives her  ?Preparing Food and eating ? N  ?Using the Toilet? N  ?In the past six months, have you accidently leaked urine? N  ?Do you have problems with loss o

## 2021-11-13 ENCOUNTER — Ambulatory Visit (INDEPENDENT_AMBULATORY_CARE_PROVIDER_SITE_OTHER): Payer: Medicare HMO | Admitting: Nurse Practitioner

## 2021-11-13 ENCOUNTER — Encounter (INDEPENDENT_AMBULATORY_CARE_PROVIDER_SITE_OTHER): Payer: Self-pay | Admitting: Nurse Practitioner

## 2021-11-13 VITALS — BP 170/79 | HR 80 | Resp 16

## 2021-11-13 DIAGNOSIS — I70221 Atherosclerosis of native arteries of extremities with rest pain, right leg: Secondary | ICD-10-CM

## 2021-11-13 DIAGNOSIS — I10 Essential (primary) hypertension: Secondary | ICD-10-CM

## 2021-11-13 NOTE — H&P (View-Only) (Signed)
? ?Subjective:  ? ? Patient ID: Misty Ferguson, female    DOB: 09/16/1933, 88 y.o.   MRN: [id number] ?Chief Complaint  ?Patient presents with  ? Establish Care  ?  Referred by Dr Valorie Roosevelt  ? ? ?Misty Ferguson is a 88 year old female that presents today after having persistent and severe heel pain in her right lower extremity for the last several weeks.  It was noted that the heel also has some discoloration as well.  The patient notes that the pain has progressed to the point where she is unable to tolerate any sort of pressure on her heel without having severe pain.  The patient is not very ambulatory due to her oxygen requirements with COPD as well as his new heel pain but she does note significant claudication pain in the right lower extremity when she was more active.  The foot is also cool to palpation with evidence of what is likely a deep tissue injury on the right heel as well. ? ?Today the patient has ABI 0.81 on the right and 0.99 on the left.  The patient underwent segmental duplex which shows monophasic waveforms in the right common femoral artery and popliteal with dampened monophasic in the tibial arteries.  The great digit is abnormal with no flow detected in the second through the fifth digits.  The TBI in the right lower extremity is 0.12.  The left lower extremity has biphasic waveforms noted throughout the femoral artery down to the distal tibials.  The patient also has dampened toe waveforms throughout the digits.  Additional technologist notes there is disease noted at the iliac level. ? ? ?Review of Systems  ?Musculoskeletal:  Positive for gait problem.  ?Skin:  Positive for color change.  ?Hematological:  Bruises/bleeds easily.  ?All other systems reviewed and are negative. ? ?   ?Objective:  ? Physical Exam ?Vitals reviewed.  ?HENT:  ?   Head: Normocephalic.  ?Cardiovascular:  ?   Rate and Rhythm: Normal rate.  ?   Pulses:     ?     Dorsalis pedis pulses are 0 on the right side and detected w/  Doppler on the left side.  ?     Posterior tibial pulses are 0 on the right side and detected w/ Doppler on the left side.  ?Pulmonary:  ?   Effort: Pulmonary effort is normal.  ?   Comments: Home O2 ?Skin: ?   General: Skin is cool and dry.  ?   Capillary Refill: Capillary refill takes more than 3 seconds.  ?   Coloration: Skin is cyanotic (right foot).  ?   Findings: Bruising (left foot) present.  ?Neurological:  ?   Mental Status: She is alert and oriented to person, place, and time.  ?Psychiatric:     ?   Mood and Affect: Mood normal.     ?   Behavior: Behavior normal.     ?   Thought Content: Thought content normal.     ?   Judgment: Judgment normal.  ? ? ?BP (!) 170/79 (BP Location: Left Arm)   Pulse 80   Resp 16  ? ?Past Medical History:  ?Diagnosis Date  ? AK (actinic keratosis) 12/08/2020  ? right pretibia inferior bx proven, LN2 01/10/21  ? Burping   ? Chronic airway obstruction, not elsewhere classified   ? Dyspnea   ? Dysrhythmia   ? Macular degeneration (senile) of retina, unspecified   ? Malignant neoplasm of urinary bladder (HCC)  03/2019  ? Partial bladder resection and Rad tx's.   ? Obstructive chronic bronchitis with exacerbation ([place])   ? Osteoporosis, unspecified   ? Oxygen deficiency   ? Personal history of peptic ulcer disease   ? SCC (squamous cell carcinoma) 12/08/2020  ? right pretibia superior, [facility] 01/10/21  ? Tobacco use disorder   ? Unspecified essential hypertension   ? Unspecified glaucoma(365.9)   ? ? ?Social History  ? ?Socioeconomic History  ? Marital status: Married  ?  Spouse name: Not on file  ? Number of children: 3  ? Years of education: Not on file  ? Highest education level: Not on file  ?Occupational History  ? Occupation: RETIRED  ?  Employer: RETIRED  ?Tobacco Use  ? Smoking status: Former  ?  Packs/day: 1.30  ?  Years: 45.00  ?  Pack years: 58.50  ?  Types: Cigarettes  ?  Quit date: 08/14/2003  ?  Years since quitting: 18.2  ? Smokeless tobacco: Never  ?Vaping Use  ? Vaping  Use: Never used  ?Substance and Sexual Activity  ? Alcohol use: Yes  ?  Alcohol/week: 14.0 standard drinks  ?  Types: 14 Glasses of wine per week  ?  Comment: 1-2 wine daily  ? Drug use: No  ? Sexual activity: Yes  ?  Birth control/protection: Post-menopausal  ?Other Topics Concern  ? Not on file  ?Social History Narrative  ? Tobacco abuse x 45 years  ? ?Social Determinants of Health  ? ?Financial Resource Strain: Low Risk   ? Difficulty of Paying Living Expenses: Not very hard  ?Food Insecurity: No Food Insecurity  ? Worried About Charity fundraiser in the Last Year: Never true  ? Ran Out of Food in the Last Year: Never true  ?Transportation Needs: No Transportation Needs  ? Lack of Transportation (Medical): No  ? Lack of Transportation (Non-Medical): No  ?Physical Activity: Insufficiently Active  ? Days of Exercise per Week: 7 days  ? Minutes of Exercise per Session: 10 min  ?Stress: No Stress Concern Present  ? Feeling of Stress : Only a little  ?Social Connections: Moderately Isolated  ? Frequency of Communication with Friends and Family: More than three times a week  ? Frequency of Social Gatherings with Friends and Family: More than three times a week  ? Attends Religious Services: Never  ? Active Member of Clubs or Organizations: No  ? Attends Archivist Meetings: Never  ? Marital Status: Married  ?Intimate Partner Violence: Not At Risk  ? Fear of Current or Ex-Partner: No  ? Emotionally Abused: No  ? Physically Abused: No  ? Sexually Abused: No  ? ? ?Past Surgical History:  ?Procedure Laterality Date  ? CATARACT EXTRACTION W/ INTRAOCULAR LENS  IMPLANT, BILATERAL    ? CYSTOSCOPY WITH STENT PLACEMENT Left 05/22/2019  ? Procedure: CYSTOSCOPY WITH STENT PLACEMENT;  Surgeon: Billey Co, MD;  Location: [facility] ORS;  Service: Urology;  Laterality: Left;  ? ELBOW FRACTURE SURGERY Left   ? TRANSURETHRAL RESECTION OF BLADDER TUMOR N/A 05/22/2019  ? Procedure: TRANSURETHRAL RESECTION OF BLADDER TUMOR  (TURBT);  Surgeon: Billey Co, MD;  Location: [facility] ORS;  Service: Urology;  Laterality: N/A;  ? ? ?Family History  ?Adopted: Yes  ? ? ?No Known Allergies ? ? ?  Latest Ref Rng & Units 11/06/2021  ? 11:00 AM 04/30/2019  ?  4:13 PM 10/22/2017  ?  9:19 PM  ?CBC  ?WBC 4.0 -  10.5 K/uL 5.6   6.5   6.2    ?Hemoglobin 12.0 - 15.0 g/dL 13.0   12.4   12.5    ?Hematocrit 36.0 - 46.0 % 39.0   37.1   37.5    ?Platelets 150.0 - 400.0 K/uL 220.0   295   214    ? ? ? ? ?CMP  ?   ?Component Value Date/Time  ? NA 142 11/06/2021 1100  ? NA 141 06/10/2019 1324  ? K 4.9 11/06/2021 1100  ? K 4.1 07/23/2012 1536  ? CL 103 11/06/2021 1100  ? CO2 32 11/06/2021 1100  ? GLUCOSE 108 (H) 11/06/2021 1100  ? BUN 21 11/06/2021 1100  ? BUN 15 06/10/2019 1324  ? CREATININE 0.83 11/06/2021 1100  ? CREATININE 1.26 (H) 12/23/2012 1440  ? CALCIUM 9.8 11/06/2021 1100  ? PROT 6.6 11/06/2021 1100  ? PROT 6.6 06/10/2019 1324  ? ALBUMIN 4.8 11/06/2021 1100  ? ALBUMIN 4.5 06/10/2019 1324  ? AST 27 11/06/2021 1100  ? ALT 33 11/06/2021 1100  ? ALKPHOS 66 11/06/2021 1100  ? BILITOT 0.5 11/06/2021 1100  ? BILITOT 0.4 06/10/2019 1324  ? GFRNONAA 54 (L) 06/10/2019 1324  ? GFRAA 62 06/10/2019 1324  ? ? ? ?No results found. ? ?   ?Assessment & Plan:  ? ?1. Atherosclerosis of native artery of right lower extremity with rest pain ([place]) ?Recommend: ? ?The patient has evidence of severe atherosclerotic changes of the right lower extremities with rest pain that is associated with preulcerative changes and impending tissue loss of the foot.  This represents a limb threatening ischemia and places the patient at the risk for limb loss. ? ?Patient should undergo angiography of the lower extremities with the hope for intervention for limb salvage.  The risks and benefits as well as the alternative therapies was discussed in detail with the patient.  All questions were answered.  Patient agrees to proceed with angiography. ? ?The patient will follow up with me in the office  after the procedure.  ? ?   ? ?2. Essential hypertension, benign ?Continue antihypertensive medications as already ordered, these medications have been reviewed and there are no changes at this time.  ? ? ?Cur

## 2021-11-13 NOTE — Progress Notes (Signed)
? ?Subjective:  ? ? Patient ID: Misty Ferguson, female    DOB: [birth year], 88 y.o.   MRN: [id number] ?Chief Complaint  ?Patient presents with  ? Establish Care  ?  Referred by Dr Valorie Roosevelt  ? ? ?Misty Ferguson is a 88 year old female that presents today after having persistent and severe heel pain in her right lower extremity for the last several weeks.  It was noted that the heel also has some discoloration as well.  The patient notes that the pain has progressed to the point where she is unable to tolerate any sort of pressure on her heel without having severe pain.  The patient is not very ambulatory due to her oxygen requirements with COPD as well as his new heel pain but she does note significant claudication pain in the right lower extremity when she was more active.  The foot is also cool to palpation with evidence of what is likely a deep tissue injury on the right heel as well. ? ?Today the patient has ABI 0.81 on the right and 0.99 on the left.  The patient underwent segmental duplex which shows monophasic waveforms in the right common femoral artery and popliteal with dampened monophasic in the tibial arteries.  The great digit is abnormal with no flow detected in the second through the fifth digits.  The TBI in the right lower extremity is 0.12.  The left lower extremity has biphasic waveforms noted throughout the femoral artery down to the distal tibials.  The patient also has dampened toe waveforms throughout the digits.  Additional technologist notes there is disease noted at the iliac level. ? ? ?Review of Systems  ?Musculoskeletal:  Positive for gait problem.  ?Skin:  Positive for color change.  ?Hematological:  Bruises/bleeds easily.  ?All other systems reviewed and are negative. ? ?   ?Objective:  ? Physical Exam ?Vitals reviewed.  ?HENT:  ?   Head: Normocephalic.  ?Cardiovascular:  ?   Rate and Rhythm: Normal rate.  ?   Pulses:     ?     Dorsalis pedis pulses are 0 on the right side and detected w/  Doppler on the left side.  ?     Posterior tibial pulses are 0 on the right side and detected w/ Doppler on the left side.  ?Pulmonary:  ?   Effort: Pulmonary effort is normal.  ?   Comments: Home O2 ?Skin: ?   General: Skin is cool and dry.  ?   Capillary Refill: Capillary refill takes more than 3 seconds.  ?   Coloration: Skin is cyanotic (right foot).  ?   Findings: Bruising (left foot) present.  ?Neurological:  ?   Mental Status: She is alert and oriented to person, place, and time.  ?Psychiatric:     ?   Mood and Affect: Mood normal.     ?   Behavior: Behavior normal.     ?   Thought Content: Thought content normal.     ?   Judgment: Judgment normal.  ? ? ?BP (!) 170/79 (BP Location: Left Arm)   Pulse 80   Resp 16  ? ?Past Medical History:  ?Diagnosis Date  ? AK (actinic keratosis) 12/08/2020  ? right pretibia inferior bx proven, LN2 01/10/21  ? Burping   ? Chronic airway obstruction, not elsewhere classified   ? Dyspnea   ? Dysrhythmia   ? Macular degeneration (senile) of retina, unspecified   ? Malignant neoplasm of urinary bladder (HCC)  03/2019  ? Partial bladder resection and Rad tx's.   ? Obstructive chronic bronchitis with exacerbation ([place])   ? Osteoporosis, unspecified   ? Oxygen deficiency   ? Personal history of peptic ulcer disease   ? SCC (squamous cell carcinoma) 12/08/2020  ? right pretibia superior, [facility] 01/10/21  ? Tobacco use disorder   ? Unspecified essential hypertension   ? Unspecified glaucoma(365.9)   ? ? ?Social History  ? ?Socioeconomic History  ? Marital status: Married  ?  Spouse name: Not on file  ? Number of children: 3  ? Years of education: Not on file  ? Highest education level: Not on file  ?Occupational History  ? Occupation: RETIRED  ?  Employer: RETIRED  ?Tobacco Use  ? Smoking status: Former  ?  Packs/day: 1.30  ?  Years: 45.00  ?  Pack years: 58.50  ?  Types: Cigarettes  ?  Quit date: 08/14/2003  ?  Years since quitting: 18.2  ? Smokeless tobacco: Never  ?Vaping Use  ? Vaping  Use: Never used  ?Substance and Sexual Activity  ? Alcohol use: Yes  ?  Alcohol/week: 14.0 standard drinks  ?  Types: 14 Glasses of wine per week  ?  Comment: 1-2 wine daily  ? Drug use: No  ? Sexual activity: Yes  ?  Birth control/protection: Post-menopausal  ?Other Topics Concern  ? Not on file  ?Social History Narrative  ? Tobacco abuse x 45 years  ? ?Social Determinants of Health  ? ?Financial Resource Strain: Low Risk   ? Difficulty of Paying Living Expenses: Not very hard  ?Food Insecurity: No Food Insecurity  ? Worried About Charity fundraiser in the Last Year: Never true  ? Ran Out of Food in the Last Year: Never true  ?Transportation Needs: No Transportation Needs  ? Lack of Transportation (Medical): No  ? Lack of Transportation (Non-Medical): No  ?Physical Activity: Insufficiently Active  ? Days of Exercise per Week: 7 days  ? Minutes of Exercise per Session: 10 min  ?Stress: No Stress Concern Present  ? Feeling of Stress : Only a little  ?Social Connections: Moderately Isolated  ? Frequency of Communication with Friends and Family: More than three times a week  ? Frequency of Social Gatherings with Friends and Family: More than three times a week  ? Attends Religious Services: Never  ? Active Member of Clubs or Organizations: No  ? Attends Archivist Meetings: Never  ? Marital Status: Married  ?Intimate Partner Violence: Not At Risk  ? Fear of Current or Ex-Partner: No  ? Emotionally Abused: No  ? Physically Abused: No  ? Sexually Abused: No  ? ? ?Past Surgical History:  ?Procedure Laterality Date  ? CATARACT EXTRACTION W/ INTRAOCULAR LENS  IMPLANT, BILATERAL    ? CYSTOSCOPY WITH STENT PLACEMENT Left 05/22/2019  ? Procedure: CYSTOSCOPY WITH STENT PLACEMENT;  Surgeon: Billey Co, MD;  Location: [facility] ORS;  Service: Urology;  Laterality: Left;  ? ELBOW FRACTURE SURGERY Left   ? TRANSURETHRAL RESECTION OF BLADDER TUMOR N/A 05/22/2019  ? Procedure: TRANSURETHRAL RESECTION OF BLADDER TUMOR  (TURBT);  Surgeon: Billey Co, MD;  Location: [facility] ORS;  Service: Urology;  Laterality: N/A;  ? ? ?Family History  ?Adopted: Yes  ? ? ?No Known Allergies ? ? ?  Latest Ref Rng & Units 11/06/2021  ? 11:00 AM 04/30/2019  ?  4:13 PM 10/22/2017  ?  9:19 PM  ?CBC  ?WBC 4.0 -  10.5 K/uL 5.6   6.5   6.2    ?Hemoglobin 12.0 - 15.0 g/dL 13.0   12.4   12.5    ?Hematocrit 36.0 - 46.0 % 39.0   37.1   37.5    ?Platelets 150.0 - 400.0 K/uL 220.0   295   214    ? ? ? ? ?CMP  ?   ?Component Value Date/Time  ? NA 142 11/06/2021 1100  ? NA 141 06/10/2019 1324  ? K 4.9 11/06/2021 1100  ? K 4.1 07/23/2012 1536  ? CL 103 11/06/2021 1100  ? CO2 32 11/06/2021 1100  ? GLUCOSE 108 (H) 11/06/2021 1100  ? BUN 21 11/06/2021 1100  ? BUN 15 06/10/2019 1324  ? CREATININE 0.83 11/06/2021 1100  ? CREATININE 1.26 (H) 12/23/2012 1440  ? CALCIUM 9.8 11/06/2021 1100  ? PROT 6.6 11/06/2021 1100  ? PROT 6.6 06/10/2019 1324  ? ALBUMIN 4.8 11/06/2021 1100  ? ALBUMIN 4.5 06/10/2019 1324  ? AST 27 11/06/2021 1100  ? ALT 33 11/06/2021 1100  ? ALKPHOS 66 11/06/2021 1100  ? BILITOT 0.5 11/06/2021 1100  ? BILITOT 0.4 06/10/2019 1324  ? GFRNONAA 54 (L) 06/10/2019 1324  ? GFRAA 62 06/10/2019 1324  ? ? ? ?No results found. ? ?   ?Assessment & Plan:  ? ?1. Atherosclerosis of native artery of right lower extremity with rest pain ([place]) ?Recommend: ? ?The patient has evidence of severe atherosclerotic changes of the right lower extremities with rest pain that is associated with preulcerative changes and impending tissue loss of the foot.  This represents a limb threatening ischemia and places the patient at the risk for limb loss. ? ?Patient should undergo angiography of the lower extremities with the hope for intervention for limb salvage.  The risks and benefits as well as the alternative therapies was discussed in detail with the patient.  All questions were answered.  Patient agrees to proceed with angiography. ? ?The patient will follow up with me in the office  after the procedure.  ? ?   ? ?2. Essential hypertension, benign ?Continue antihypertensive medications as already ordered, these medications have been reviewed and there are no changes at this time.  ? ? ?Cur

## 2021-11-14 ENCOUNTER — Telehealth (INDEPENDENT_AMBULATORY_CARE_PROVIDER_SITE_OTHER): Payer: Self-pay

## 2021-11-14 ENCOUNTER — Encounter: Payer: Self-pay | Admitting: Family Medicine

## 2021-11-14 ENCOUNTER — Ambulatory Visit (INDEPENDENT_AMBULATORY_CARE_PROVIDER_SITE_OTHER): Payer: Medicare HMO | Admitting: Family Medicine

## 2021-11-14 VITALS — BP 140/70 | HR 73 | Temp 98.1°F | Ht 61.0 in | Wt 96.0 lb

## 2021-11-14 DIAGNOSIS — E7800 Pure hypercholesterolemia, unspecified: Secondary | ICD-10-CM | POA: Diagnosis not present

## 2021-11-14 DIAGNOSIS — I10 Essential (primary) hypertension: Secondary | ICD-10-CM | POA: Diagnosis not present

## 2021-11-14 DIAGNOSIS — I739 Peripheral vascular disease, unspecified: Secondary | ICD-10-CM | POA: Diagnosis not present

## 2021-11-14 DIAGNOSIS — Z0001 Encounter for general adult medical examination with abnormal findings: Secondary | ICD-10-CM | POA: Diagnosis not present

## 2021-11-14 DIAGNOSIS — Z0000 Encounter for general adult medical examination without abnormal findings: Secondary | ICD-10-CM

## 2021-11-14 DIAGNOSIS — J9611 Chronic respiratory failure with hypoxia: Secondary | ICD-10-CM | POA: Diagnosis not present

## 2021-11-14 DIAGNOSIS — J432 Centrilobular emphysema: Secondary | ICD-10-CM

## 2021-11-14 DIAGNOSIS — D692 Other nonthrombocytopenic purpura: Secondary | ICD-10-CM | POA: Diagnosis not present

## 2021-11-14 NOTE — Patient Instructions (Addendum)
Try 2 tabs of hydrocodone at bedtime... call/Mychart let me know if prescription need of if we need to change to another pain med. ? Consider a bedside commode. ? Low cholesterol low carb diet. ? ? ?Here is some info I have gathered for you for a trusted medical source. ?Lipid Management With Diet, Uptodate Feb 20.2021, Marisa Hua and [place] ? ?Although earlier, smaller trials suggested a benefit of garlic supplementation, a subsequent larger trial failed to demonstrate improvement in lipids with use of any of three different garlic preparations (raw, powdered, or aged). ? ?Bergamot: Improvements in serum lipids have been reported in trials of patients with metabolic syndrome, nonalcoholic fatty liver disease and in hyperlipidemic patients resistant to statin treatment. However, high-quality data on the effects of bergamot are lacking. ? ?Suggestions for you if you would like to try natural supplements to lower cholesterol. ? ?1.Souble fiber : Psyllium ?In a meta-analysis of randomized trials of patients with both normal and elevated cholesterol levels, the addition of 10.2 g/day of psyllium lowered the LDL cholesterol by an average of 12.8 mg/dL  ? ?2. Omega 3s: Mixed results in studies. Given you triglycerides are normal I would not use this. ? ?3.Red yeast rice ( 2.4 grams divided half in AM half in PM): Red yeast rice is a fermented rice product, most often taken as a supplement, which can improve serum cholesterol  via  method similar to prescription statins.  ?Red yeast rice supplements lowered total cholesterol (208 versus 251 mg/dL) and LDL cholesterol (135 versus 175 mg/dL) compared with placebo. ? ?4. Plant sterol.. There are naturally occurring sterols and stanols in nuts, legumes, whole grains, fruits, vegetables, and plant oils. In addition, a number of manufactured products enriched with plant sterols and stanols are commercially available. The margarines containing these compounds (eg, Benecol and  Take Control spreads) have been available the longest and are the most studied  ?In a trial of 150 patients with mild hypercholesterolemia,those consuming the fortified margarine experienced a 10 to 14 percent decrease in total cholesterol and LDL cholesterol. ? ?5.Green Tea Catechins: .n a year-long randomized trial of more than 900 healthy postmenopausal women, green tea catechin supplements (1315 mg catechins/day) reduced total cholesterol and LDL cholesterol, increased triglycerides, and had no effect on HDL cholesterol  ? ?

## 2021-11-14 NOTE — Telephone Encounter (Signed)
Spoke with the patient and she is scheduled with Dr. Lucky Cowboy for a RLE angio on 11/20/21 with a 9:00 am arrival time to the [facility]. Pre-procedure instructions were discussed and will be mailed. ?

## 2021-11-14 NOTE — Progress Notes (Signed)
? ? Patient ID: Misty Ferguson, female    DOB: 12/08/1933, 88 y.o.   MRN: [id number] ? ?This visit was conducted in person. ? ?BP 140/70   Pulse 73   Temp 98.1 ?F (36.7 ?C) (Oral)   Ht '5\' 1"'$  (1.549 m) Comment: Patient reported  Wt 96 lb (43.5 kg) Comment: Patient reported  SpO2 97% Comment: 3.0 L O2  BMI 18.14 kg/m?   ? ?CC:  ?Chief Complaint  ?Patient presents with  ? Annual Exam  ?  Part 2  ? ? ?Subjective:  ? ?HPI: ?Misty Ferguson is a 88 y.o. female presenting on 11/14/2021 for Annual Exam (Part 2) ? ?The patient presents for annual medicare wellness, complete physical and review of chronic health problems. He/She also has the following acute concerns today: Bilateral PVD ? ?The patient saw a LPN or RN for medicare wellness visit. ? Prevention and wellness was reviewed in detail. ?Note reviewed and important notes copied below. ? ? PVD bilateral legs.. now seeing  vascular. Right worse than left. ? Has angiogram planned. ? She is having right heel.. pain causing pain  keeping her up at night. ? Hydrocodone 5/325 mg 1 tablet minimal help. ? ? COPD, severe Stable  given minimally active. ? On continuous oxygen. ? ? No chest pain. ? ? Good water intake ? ?Elevated Cholesterol:  Not interested in a statin medication. ?Lab Results  ?Component Value Date  ? CHOL 249 (H) 11/06/2021  ? HDL 128.30 11/06/2021  ? LDLCALC 104 (H) 11/06/2021  ? LDLDIRECT 99.1 09/08/2013  ? TRIG 82.0 11/06/2021  ? CHOLHDL 2 11/06/2021  ?Using medications without problems: ?Muscle aches:  ?Diet compliance:  good ?Exercise:limited given COPD ?Other complaints: ? ? ? Prediabetes;  ?Lab Results  ?Component Value Date  ? HGBA1C 5.9 11/06/2021  ? ? ? ?Hypertension:  Stable control on current medications losartan and verapamil LA ?BP Readings from Last 3 Encounters:  ?11/14/21 140/70  ?11/13/21 (!) 170/79  ?11/06/21 (!) 158/70  ? ?Using medication without problems or lightheadedness: none ?Chest pain with exertion:none ?Edema:none ?Short of  breath: yes ?Average home BPs: ?Other issues: ? ?   ? ?Relevant past medical, surgical, family and social history reviewed and updated as indicated. Interim medical history since our last visit reviewed. ?Allergies and medications reviewed and updated. ?Outpatient Medications Prior to Visit  ?Medication Sig Dispense Refill  ? ascorbic acid (VITAMIN C) 1000 MG tablet Take 1,000 mg by mouth daily.     ? budesonide-formoterol (SYMBICORT) 160-4.5 MCG/ACT inhaler Inhale 2 puffs into the lungs in the morning and at bedtime. 3 each 0  ? Cholecalciferol (VITAMIN D) 125 MCG (5000 UT) CAPS Take 5,000 Units by mouth daily.    ? docusate sodium (COLACE) 100 MG capsule Take 100 mg by mouth 2 (two) times daily.    ? losartan (COZAAR) 25 MG tablet 25 mg daily.     ? Multiple Vitamins-Minerals (PRESERVISION AREDS) TABS Take 1 tablet by mouth 2 (two) times daily.    ? Omega-3 Fatty Acids (FISH OIL) 1000 MG CAPS Take 1,000 mg by mouth 2 (two) times daily.     ? OXYGEN Inhale 4 L into the lungs as needed.    ? verapamil (VERELAN PM) 360 MG 24 hr capsule Take 360 mg by mouth daily.    ? Vitamin E 180 MG CAPS Take 180 mg by mouth daily.    ? albuterol (PROVENTIL) (2.5 MG/3ML) 0.083% nebulizer solution INHALE THE CONTENTS OF 1 VIAL  VIA NEBULIZER EVERY 6 HOURS AS NEEDED FOR WHEEZING OR FOR SHORTNESS OF BREATH 360 mL 3  ? cephALEXin (KEFLEX) 500 MG capsule Take 1 capsule (500 mg total) by mouth 4 (four) times daily. (Patient not taking: Reported on 11/13/2021) 28 capsule 0  ? ?No facility-administered medications prior to visit.  ?  ? ?Per HPI unless specifically indicated in ROS section below ?Review of Systems  ?Constitutional:  Negative for fatigue and fever.  ?HENT:  Negative for congestion.   ?Eyes:  Negative for pain.  ?Respiratory:  Negative for cough and shortness of breath.   ?Cardiovascular:  Negative for chest pain, palpitations and leg swelling.  ?Gastrointestinal:  Negative for abdominal pain.  ?Genitourinary:  Negative for  dysuria and vaginal bleeding.  ?Musculoskeletal:  Negative for back pain.  ?Neurological:  Negative for syncope, light-headedness and headaches.  ?Psychiatric/Behavioral:  Negative for dysphoric mood.   ?Objective:  ?BP 140/70   Pulse 73   Temp 98.1 ?F (36.7 ?C) (Oral)   Ht '5\' 1"'$  (1.549 m) Comment: Patient reported  Wt 96 lb (43.5 kg) Comment: Patient reported  SpO2 97% Comment: 3.0 L O2  BMI 18.14 kg/m?   ?Wt Readings from Last 3 Encounters:  ?11/14/21 96 lb (43.5 kg)  ?11/07/21 95 lb (43.1 kg)  ?11/06/21 96 lb (43.5 kg)  ?  ?  ?Physical Exam ?Vitals and nursing note reviewed.  ?Constitutional:   ?   General: She is not in acute distress. ?   Appearance: Normal appearance. She is well-developed. She is not ill-appearing or toxic-appearing.  ?HENT:  ?   Head: Normocephalic.  ?   Right Ear: Hearing, tympanic membrane, ear canal and external ear normal. Tympanic membrane is not erythematous, retracted or bulging.  ?   Left Ear: Hearing, tympanic membrane, ear canal and external ear normal. Tympanic membrane is not erythematous, retracted or bulging.  ?   Nose: Nose normal. No mucosal edema or rhinorrhea.  ?   Right Sinus: No maxillary sinus tenderness or frontal sinus tenderness.  ?   Left Sinus: No maxillary sinus tenderness or frontal sinus tenderness.  ?   Mouth/Throat:  ?   Pharynx: Uvula midline.  ?Eyes:  ?   General: Lids are normal. Lids are everted, no foreign bodies appreciated.  ?   Conjunctiva/sclera: Conjunctivae normal.  ?   Pupils: Pupils are equal, round, and reactive to light.  ?Neck:  ?   Thyroid: No thyroid mass or thyromegaly.  ?   Vascular: No carotid bruit.  ?   Trachea: Trachea normal.  ?Cardiovascular:  ?   Rate and Rhythm: Normal rate and regular rhythm.  ?   Pulses: Decreased pulses.     ?     Dorsalis pedis pulses are 0 on the right side and 0 on the left side.  ?     Posterior tibial pulses are 0 on the right side and 0 on the left side.  ?   Heart sounds: Normal heart sounds, S1  normal and S2 normal. No murmur heard. ?  No friction rub. No gallop.  ?Pulmonary:  ?   Effort: Pulmonary effort is normal. No tachypnea or respiratory distress.  ?   Breath sounds: Normal breath sounds. No decreased breath sounds, wheezing, rhonchi or rales.  ?Abdominal:  ?   General: Bowel sounds are normal. There is no distension or abdominal bruit.  ?   Palpations: Abdomen is soft. There is no fluid wave or mass.  ?   Tenderness: There  is no abdominal tenderness. There is no guarding or rebound.  ?   Hernia: No hernia is present.  ?Musculoskeletal:  ?   Cervical back: Normal range of motion and neck supple.  ?   Right lower leg: No edema.  ?   Left lower leg: No edema.  ?Lymphadenopathy:  ?   Cervical: No cervical adenopathy.  ?Skin: ?   General: Skin is warm and dry.  ?   Findings: No rash.  ?Neurological:  ?   Mental Status: She is alert.  ?   Cranial Nerves: No cranial nerve deficit.  ?   Sensory: No sensory deficit.  ?Psychiatric:     ?   Mood and Affect: Mood is not anxious or depressed.     ?   Speech: Speech normal.     ?   Behavior: Behavior normal. Behavior is cooperative.     ?   Thought Content: Thought content normal.     ?   Judgment: Judgment normal.  ? ?   ?Results for orders placed or performed in visit on 11/06/21  ?CBC  ?Result Value Ref Range  ? WBC 5.6 4.0 - 10.5 K/uL  ? RBC 4.16 3.87 - 5.11 Mil/uL  ? Platelets 220.0 150.0 - 400.0 K/uL  ? Hemoglobin 13.0 12.0 - 15.0 g/dL  ? HCT 39.0 36.0 - 46.0 %  ? MCV 93.9 78.0 - 100.0 fl  ? MCHC 33.4 30.0 - 36.0 g/dL  ? RDW 12.9 11.5 - 15.5 %  ?Hemoglobin A1c  ?Result Value Ref Range  ? Hgb A1c MFr Bld 5.9 4.6 - 6.5 %  ?Lipid panel  ?Result Value Ref Range  ? Cholesterol 249 (H) 0 - 200 mg/dL  ? Triglycerides 82.0 0.0 - 149.0 mg/dL  ? HDL 128.30 >39.00 mg/dL  ? VLDL 16.4 0.0 - 40.0 mg/dL  ? LDL Cholesterol 104 (H) 0 - 99 mg/dL  ? Total CHOL/HDL Ratio 2   ? NonHDL 120.23   ?Comprehensive metabolic panel  ?Result Value Ref Range  ? Sodium 142 135 - 145 mEq/L   ? Potassium 4.9 3.5 - 5.1 mEq/L  ? Chloride 103 96 - 112 mEq/L  ? CO2 32 19 - 32 mEq/L  ? Glucose, Bld 108 (H) 70 - 99 mg/dL  ? BUN 21 6 - 23 mg/dL  ? Creatinine, Ser 0.83 0.40 - 1.20 mg/dL  ? Total Bilirubin 0.5 0.2 - 1.2

## 2021-11-15 ENCOUNTER — Encounter: Payer: Self-pay | Admitting: Family Medicine

## 2021-11-20 ENCOUNTER — Ambulatory Visit
Admission: RE | Admit: 2021-11-20 | Discharge: 2021-11-20 | Disposition: A | Discharge status: Home / Self Care | Payer: Medicare HMO | Attending: Vascular Surgery | Admitting: Vascular Surgery

## 2021-11-20 ENCOUNTER — Encounter
Admission: RE | Disposition: A | Discharge status: Home / Self Care | Payer: Self-pay | Source: Home / Self Care | Attending: Vascular Surgery

## 2021-11-20 ENCOUNTER — Encounter (INDEPENDENT_AMBULATORY_CARE_PROVIDER_SITE_OTHER): Payer: Self-pay

## 2021-11-20 ENCOUNTER — Encounter: Payer: Self-pay | Admitting: Vascular Surgery

## 2021-11-20 DIAGNOSIS — I70221 Atherosclerosis of native arteries of extremities with rest pain, right leg: Secondary | ICD-10-CM | POA: Diagnosis not present

## 2021-11-20 DIAGNOSIS — Z87891 Personal history of nicotine dependence: Secondary | ICD-10-CM | POA: Diagnosis not present

## 2021-11-20 DIAGNOSIS — I10 Essential (primary) hypertension: Secondary | ICD-10-CM | POA: Diagnosis not present

## 2021-11-20 DIAGNOSIS — I70202 Unspecified atherosclerosis of native arteries of extremities, left leg: Secondary | ICD-10-CM | POA: Diagnosis not present

## 2021-11-20 DIAGNOSIS — I77811 Abdominal aortic ectasia: Secondary | ICD-10-CM

## 2021-11-20 DIAGNOSIS — J449 Chronic obstructive pulmonary disease, unspecified: Secondary | ICD-10-CM | POA: Insufficient documentation

## 2021-11-20 DIAGNOSIS — I70229 Atherosclerosis of native arteries of extremities with rest pain, unspecified extremity: Secondary | ICD-10-CM

## 2021-11-20 HISTORY — PX: LOWER EXTREMITY ANGIOGRAPHY: CATH118251

## 2021-11-20 LAB — BUN: BUN: 23 mg/dL (ref 8–23)

## 2021-11-20 LAB — CREATININE, SERUM
Creatinine, Ser: 0.93 mg/dL (ref 0.44–1.00)
GFR, Estimated: >60 mL/min (ref >60)

## 2021-11-20 SURGERY — LOWER EXTREMITY ANGIOGRAPHY
Anesthesia: Moderate Sedation | Site: Leg Lower | Laterality: Right

## 2021-11-20 MED ORDER — ATORVASTATIN CALCIUM 10 MG PO TABS: 10.0000 mg | ORAL_TABLET | Freq: Every day | ORAL | 11 refills | Status: DC

## 2021-11-20 MED ORDER — FENTANYL CITRATE (PF) 100 MCG/2ML IJ SOLN
INTRAMUSCULAR | Status: DC | PRN
  Administered 2021-11-20 (×2): 25 ug via INTRAVENOUS
  Administered 2021-11-20: 50 ug via INTRAVENOUS

## 2021-11-20 MED ORDER — SODIUM CHLORIDE 0.9% FLUSH: 3.0000 mL | Freq: Two times a day (BID) | INTRAVENOUS | Status: DC

## 2021-11-20 MED ORDER — SODIUM CHLORIDE 0.9% FLUSH: 3.0000 mL | INTRAVENOUS | Status: DC | PRN

## 2021-11-20 MED ORDER — ATORVASTATIN CALCIUM 10 MG PO TABS: 10.0000 mg | ORAL_TABLET | Freq: Every day | ORAL | Status: DC

## 2021-11-20 MED ORDER — HYDRALAZINE HCL 20 MG/ML IJ SOLN: 5.0000 mg | INTRAMUSCULAR | Status: DC | PRN

## 2021-11-20 MED ORDER — METHYLPREDNISOLONE SODIUM SUCC 125 MG IJ SOLR: 125.0000 mg | Freq: Once | INTRAMUSCULAR | Status: DC | PRN

## 2021-11-20 MED ORDER — SODIUM CHLORIDE 0.9 % IV SOLN: INTRAVENOUS | Status: DC

## 2021-11-20 MED ORDER — MIDAZOLAM HCL 2 MG/2ML IJ SOLN
INTRAMUSCULAR | Status: AC
  Filled 2021-11-20: qty 2

## 2021-11-20 MED ORDER — LABETALOL HCL 5 MG/ML IV SOLN: 10.0000 mg | INTRAVENOUS | Status: DC | PRN

## 2021-11-20 MED ORDER — ASPIRIN EC 81 MG PO TBEC: 81.0000 mg | DELAYED_RELEASE_TABLET | Freq: Every day | ORAL | 2 refills | Status: DC

## 2021-11-20 MED ORDER — ASPIRIN EC 81 MG PO TBEC: 81.0000 mg | DELAYED_RELEASE_TABLET | Freq: Every day | ORAL | Status: DC

## 2021-11-20 MED ORDER — FENTANYL CITRATE (PF) 100 MCG/2ML IJ SOLN
INTRAMUSCULAR | Status: AC
  Filled 2021-11-20: qty 2

## 2021-11-20 MED ORDER — MIDAZOLAM HCL 2 MG/ML PO SYRP: 8.0000 mg | ORAL_SOLUTION | Freq: Once | ORAL | Status: DC | PRN

## 2021-11-20 MED ORDER — ONDANSETRON HCL 4 MG/2ML IJ SOLN
4.0000 mg | Freq: Four times a day (QID) | INTRAMUSCULAR | Status: DC | PRN
Start: 2021-11-20 — End: 2021-11-20

## 2021-11-20 MED ORDER — SODIUM CHLORIDE 0.9 % IV SOLN: 250.0000 mL | INTRAVENOUS | Status: DC | PRN

## 2021-11-20 MED ORDER — CLOPIDOGREL BISULFATE 75 MG PO TABS: 75.0000 mg | ORAL_TABLET | Freq: Every day | ORAL | Status: DC

## 2021-11-20 MED ORDER — HEPARIN SODIUM (PORCINE) 1000 UNIT/ML IJ SOLN
INTRAMUSCULAR | Status: AC
  Filled 2021-11-20: qty 10

## 2021-11-20 MED ORDER — DIPHENHYDRAMINE HCL 50 MG/ML IJ SOLN: 50.0000 mg | Freq: Once | INTRAMUSCULAR | Status: DC | PRN

## 2021-11-20 MED ORDER — CEFAZOLIN SODIUM-DEXTROSE 2-4 GM/100ML-% IV SOLN
INTRAVENOUS | Status: AC
  Administered 2021-11-20: 2 g via INTRAVENOUS
  Filled 2021-11-20: qty 100

## 2021-11-20 MED ORDER — ACETAMINOPHEN 325 MG PO TABS: 650.0000 mg | ORAL_TABLET | ORAL | Status: DC | PRN

## 2021-11-20 MED ORDER — ONDANSETRON HCL 4 MG/2ML IJ SOLN: 4.0000 mg | Freq: Four times a day (QID) | INTRAMUSCULAR | Status: DC | PRN

## 2021-11-20 MED ORDER — MIDAZOLAM HCL 2 MG/2ML IJ SOLN
INTRAMUSCULAR | Status: DC | PRN
Start: 2021-11-20 — End: 2021-11-20
  Administered 2021-11-20 (×2): .5 mg via INTRAVENOUS
  Administered 2021-11-20: 1 mg via INTRAVENOUS

## 2021-11-20 MED ORDER — CEFAZOLIN SODIUM-DEXTROSE 2-4 GM/100ML-% IV SOLN: 2.0000 g | Freq: Once | INTRAVENOUS | Status: AC

## 2021-11-20 MED ORDER — HYDROMORPHONE HCL 1 MG/ML IJ SOLN: 1.0000 mg | Freq: Once | INTRAMUSCULAR | Status: DC | PRN

## 2021-11-20 MED ORDER — HEPARIN SODIUM (PORCINE) 1000 UNIT/ML IJ SOLN
INTRAMUSCULAR | Status: DC | PRN
Start: 2021-11-20 — End: 2021-11-20
  Administered 2021-11-20: 4000 [IU] via INTRAVENOUS

## 2021-11-20 MED ORDER — CLOPIDOGREL BISULFATE 75 MG PO TABS: 75.0000 mg | ORAL_TABLET | Freq: Every day | ORAL | 11 refills | Status: DC

## 2021-11-20 MED ORDER — FAMOTIDINE 20 MG PO TABS
40.0000 mg | ORAL_TABLET | Freq: Once | ORAL | Status: DC | PRN
Start: 2021-11-20 — End: 2021-11-20

## 2021-11-20 MED ORDER — IODIXANOL 320 MG/ML IV SOLN
INTRAVENOUS | Status: DC | PRN
  Administered 2021-11-20: 75 mL via INTRA_ARTERIAL

## 2021-11-20 SURGICAL SUPPLY — 17 items
BALLN LUTONIX DCB 5X100X130 (BALLOONS) ×2
BALLN LUTONIX DCB 6X40X130 (BALLOONS) ×2
BALLOON LUTONIX DCB 5X100X130 (BALLOONS) IMPLANT
BALLOON LUTONIX DCB 6X40X130 (BALLOONS) IMPLANT
CATH ANGIO 5F PIGTAIL 65CM (CATHETERS) ×1 IMPLANT
COVER PROBE U/S 5X48 (MISCELLANEOUS) ×1 IMPLANT
DEVICE STARCLOSE SE CLOSURE (Vascular Products) ×1 IMPLANT
DEVICE TORQUE (MISCELLANEOUS) ×1 IMPLANT
GLIDEWIRE ADV .035X260CM (WIRE) ×1 IMPLANT
KIT ENCORE 26 ADVANTAGE (KITS) ×1 IMPLANT
PACK ANGIOGRAPHY (CUSTOM PROCEDURE TRAY) ×2 IMPLANT
SHEATH BRITE TIP 5FRX11 (SHEATH) ×1 IMPLANT
SHEATH FLEXOR ANSEL2 7FRX45 (SHEATH) ×1 IMPLANT
STENT LIFESTREAM 9X58X80 (Permanent Stent) ×1 IMPLANT
SYR MEDRAD MARK 7 150ML (SYRINGE) ×1 IMPLANT
TUBING CONTRAST HIGH PRESS 72 (TUBING) ×1 IMPLANT
WIRE GUIDERIGHT .035X150 (WIRE) ×1 IMPLANT

## 2021-11-20 NOTE — Progress Notes (Signed)
Patients femoral site with scant amount of oozing. Dressing changed and patient's husband was educated on what to look for once they return home. Will keep patient 15 extra minutes to ensure no further bleeding is noted. Patient tolerated meal tray without incident. Discharge instructions given to patient and husband and both  verbalize understanding.  ?

## 2021-11-20 NOTE — Op Note (Signed)
[facility] ? Percutaneous Study/Intervention Procedural Note ? ? ?Date of Surgery: 11/20/2021 ? ?Surgeon(s):Rosealynn Mateus   ? ?Assistants:none ? ?Pre-operative Diagnosis: PAD with rest pain right lower extremity ? ?Post-operative diagnosis:  Same ? ?Procedure(s) Performed: ?            1.  Ultrasound guidance for vascular access left femoral artery ?            2.  Catheter placement into right common femoral artery from left femoral approach ?            3.  Aortogram and selective right lower extremity angiogram ?            4.  Percutaneous transluminal angioplasty of left external iliac artery with a 6 mm diameter by 4 cm length Lutonix drug-coated angioplasty balloon ?            5.  Percutaneous transluminal angioplasty of the right superficial femoral artery with a 5 mm diameter by 10 cm length Lutonix drug-coated angioplasty balloon ? 6.  Lifestream stent placement of the right common and external iliac arteries with 9 mm diameter by 58 mm length lifestream stent to address both stenoses ?            7.  StarClose closure device left femoral artery ? ?EBL: 10 cc ? ?Contrast: 65 cc ? ?Fluoro Time: 5.6 minutes ? ?Moderate Conscious Sedation Time: approximately 45 minutes using 2 mg of Versed and 100 mcg of Fentanyl ?             ?Indications:  Patient is a 88 y.o.female with rest pain symptoms worse on the right than the left. The patient has noninvasive study showing markedly reduced flow in the right foot with some reduction in the left foot as well. The patient is brought in for angiography for further evaluation and potential treatment.  Due to the limb threatening nature of the situation, angiogram was performed for attempted limb salvage. The patient is aware that if the procedure fails, amputation would be expected.  The patient also understands that even with successful revascularization, amputation may still be required due to the severity of the situation.  Risks and benefits are  discussed and informed consent is obtained.  ? ?Procedure:  The patient was identified and appropriate procedural time out was performed.  The patient was then placed supine on the table and prepped and draped in the usual sterile fashion. Moderate conscious sedation was administered during a face to face encounter with the patient throughout the procedure with my supervision of the RN administering medicines and monitoring the patient's vital signs, pulse oximetry, telemetry and mental status throughout from the start of the procedure until the patient was taken to the recovery room. Ultrasound was used to evaluate the left common femoral artery.  It was patent .  A digital ultrasound image was acquired.  A Seldinger needle was used to access the left common femoral artery under direct ultrasound guidance and a permanent image was performed.  A 0.035 J wire was advanced without resistance and a 5Fr sheath was placed.  Pigtail catheter was placed into the aorta and an AP aortogram was performed. This demonstrated that the renal arteries appeared relatively normal.  Mild ectasia of the proximal infrarenal aorta without aneurysmal degeneration or stenosis.  The iliac arteries had significant disease.  The left common iliac artery appeared to have at least a borderline 50% stenosis.  The left external iliac artery in  the mid to distal segment had about a 70% stenosis.  The left hypogastric artery was small but patent.  The right mid to distal common iliac artery had a near occlusive calcific stenosis and then there was about a 70% stenosis of the origin of the right external iliac artery with a small and diseased right internal iliac artery. I then crossed the aortic bifurcation and advanced to the right femoral head. Selective right lower extremity angiogram was then performed. This demonstrated stenosis of the proximal SFA and the 70% range with normalization of the vessel in the mid and distal segments.  The  popliteal artery appeared relatively normal.  The tibial vessels were difficult to opacify due to the very severe inflow lesions but there appeared to be two-vessel runoff distally. It was felt that it was in the patient's best interest to proceed with intervention after these images to avoid a second procedure and a larger amount of contrast and fluoroscopy based off of the findings from the initial angiogram. The patient was systemically heparinized and initially we performed angioplasty of the left external iliac artery with a 6 mm diameter by 4 cm length Lutonix drug-coated angioplasty balloon inflated to 10 atm for 1 minute.  Patient imaging showed only about a 20% residual stenosis in the left external iliac artery.  A 7 [place] [employer] sheath was then placed over the [employer] wire. I then treated the right SFA with a 5 mm diameter by 10 cm length Lutonix drug-coated angioplasty balloon inflated to 8 atm for 1 minute.  Completion imaging showed less than 20% residual stenosis in the right SFA.  I then pulled the sheath back and addressed both the right common iliac artery stenosis in the proximal right external iliac artery stenosis with a single balloon particularly since the right hypogastric artery was small and diseased.  This was a 9 mm diameter by 58 mm length lifestream stent inflated to 10 atm.  Completion imaging showed both lesions to have less than 10% residual stenosis after stent placement.  The patient will come back for a left leg angiogram from a right femoral approach in the near future.  I elected to terminate the procedure. The sheath was removed and StarClose closure device was deployed in the left femoral artery with excellent hemostatic result. The patient was taken to the recovery room in stable condition having tolerated the procedure well. ? ?Findings:   ?            Aortogram: Renal arteries appeared relatively normal.  Mild ectasia of the proximal infrarenal aorta without  aneurysmal degeneration or stenosis.  The iliac arteries had significant disease.  The left common iliac artery appeared to have at least a borderline 50% stenosis.  The left external iliac artery in the mid to distal segment had about a 70% stenosis.  The left hypogastric artery was small but patent.  The right mid to distal common iliac artery had a near occlusive calcific stenosis and then there was about a 70% stenosis of the origin of the right external iliac artery with a small and diseased right internal iliac artery. ?            Right lower Extremity:  This demonstrated stenosis of the proximal SFA and the 70% range with normalization of the vessel in the mid and distal segments.  The popliteal artery appeared relatively normal.  The tibial vessels were difficult to opacify due to the very severe inflow lesions but there appeared  to be two-vessel runoff distally. ? ? ?Disposition: Patient was taken to the recovery room in stable condition having tolerated the procedure well. ? ?Complications: None ? ?Leotis Pain ?11/20/2021 ?10:53 AM ? ? ?This note was created with Dragon Medical transcription system. Any errors in dictation are purely unintentional.  ?

## 2021-11-20 NOTE — Interval H&P Note (Signed)
History and Physical Interval Note: ? ?11/20/2021 ?9:41 AM ? ?Misty Ferguson  has presented today for surgery, with the diagnosis of RLE Angio   BARD    ASO w rest pain.  The various methods of treatment have been discussed with the patient and family. After consideration of risks, benefits and other options for treatment, the patient has consented to  Procedure(s): ?Lower Extremity Angiography (Right) as a surgical intervention.  The patient's history has been reviewed, patient examined, no change in status, stable for surgery.  I have reviewed the patient's chart and labs.  Questions were answered to the patient's satisfaction.   ? ? ?Leotis Pain ? ? ?

## 2021-11-21 ENCOUNTER — Encounter: Payer: Self-pay | Admitting: Vascular Surgery

## 2021-12-14 DIAGNOSIS — I739 Peripheral vascular disease, unspecified: Secondary | ICD-10-CM | POA: Insufficient documentation

## 2021-12-14 NOTE — Assessment & Plan Note (Signed)
Chronic, poor control.  Patient is at high risk for CVD, but given her age she is not currently interested in a statin medication. ?

## 2021-12-14 NOTE — Assessment & Plan Note (Signed)
Chronic, stable on 4 L oxygen ?

## 2021-12-14 NOTE — Assessment & Plan Note (Signed)
Chronic, stable 

## 2021-12-14 NOTE — Assessment & Plan Note (Signed)
Chronic with acute worsening ? ?Has angiogram planned.  Hydrocodone 5/325 mg 1 tablet at a time every 6 hours with minimal help. Try 2 tabs of hydrocodone at bedtime... call/Mychart let me know if prescription need of if we need to change to another pain med. ? ?

## 2021-12-14 NOTE — Assessment & Plan Note (Signed)
Chronic, severe ? ?Followed by pulmonary on controllers and rescue medication. ?

## 2021-12-14 NOTE — Assessment & Plan Note (Addendum)
Stable, chronic.  Continue current medication. ? ?Losartan 25 mg p.o. daily ?Verapamil 360 mg p.o. daily ? ? ?

## 2021-12-19 ENCOUNTER — Other Ambulatory Visit (INDEPENDENT_AMBULATORY_CARE_PROVIDER_SITE_OTHER): Payer: Self-pay | Admitting: Vascular Surgery

## 2021-12-19 DIAGNOSIS — I70221 Atherosclerosis of native arteries of extremities with rest pain, right leg: Secondary | ICD-10-CM

## 2021-12-19 DIAGNOSIS — Z95820 Peripheral vascular angioplasty status with implants and grafts: Secondary | ICD-10-CM

## 2021-12-20 ENCOUNTER — Encounter (INDEPENDENT_AMBULATORY_CARE_PROVIDER_SITE_OTHER): Payer: Self-pay | Admitting: Nurse Practitioner

## 2021-12-20 ENCOUNTER — Ambulatory Visit (INDEPENDENT_AMBULATORY_CARE_PROVIDER_SITE_OTHER): Payer: Medicare HMO | Admitting: Nurse Practitioner

## 2021-12-20 ENCOUNTER — Ambulatory Visit (INDEPENDENT_AMBULATORY_CARE_PROVIDER_SITE_OTHER): Payer: Medicare HMO

## 2021-12-20 VITALS — BP 157/77 | HR 84 | Resp 16 | Ht 61.0 in

## 2021-12-20 DIAGNOSIS — I70221 Atherosclerosis of native arteries of extremities with rest pain, right leg: Secondary | ICD-10-CM | POA: Diagnosis not present

## 2021-12-20 DIAGNOSIS — E7800 Pure hypercholesterolemia, unspecified: Secondary | ICD-10-CM

## 2021-12-20 DIAGNOSIS — I10 Essential (primary) hypertension: Secondary | ICD-10-CM | POA: Diagnosis not present

## 2021-12-20 DIAGNOSIS — Z95820 Peripheral vascular angioplasty status with implants and grafts: Secondary | ICD-10-CM

## 2021-12-29 ENCOUNTER — Encounter (INDEPENDENT_AMBULATORY_CARE_PROVIDER_SITE_OTHER): Payer: Self-pay

## 2021-12-31 ENCOUNTER — Encounter (INDEPENDENT_AMBULATORY_CARE_PROVIDER_SITE_OTHER): Payer: Self-pay | Admitting: Nurse Practitioner

## 2021-12-31 ENCOUNTER — Encounter (INDEPENDENT_AMBULATORY_CARE_PROVIDER_SITE_OTHER): Payer: Self-pay

## 2021-12-31 NOTE — Progress Notes (Signed)
Subjective:    Patient ID: Misty Ferguson, female    DOB: 12-29-1933, 88 y.o.   MRN: [id number] No chief complaint on file.   The patient returns to the office for followup and review status post angiogram with intervention on 11/20/21.  Procedure: Procedure(s) Performed:             1.  Ultrasound guidance for vascular access left femoral artery             2.  Catheter placement into right common femoral artery from left femoral approach             3.  Aortogram and selective right lower extremity angiogram             4.  Percutaneous transluminal angioplasty of left external iliac artery with a 6 mm diameter by 4 cm length Lutonix drug-coated angioplasty balloon             5.  Percutaneous transluminal angioplasty of the right superficial femoral artery with a 5 mm diameter by 10 cm length Lutonix drug-coated angioplasty balloon             6.  Lifestream stent placement of the right common and external iliac arteries with 9 mm diameter by 58 mm length lifestream stent to address both stenoses             7.  StarClose closure device left femoral artery  The patient notes improvement in the lower extremity symptoms. No interval shortening of the patient's claudication distance or rest pain symptoms. No new ulcers or wounds have occurred since the last visit.  There have been no significant changes to the patient's overall health care.  No documented history of amaurosis fugax or recent TIA symptoms. There are no recent neurological changes noted. No documented history of DVT, PE or superficial thrombophlebitis. The patient denies recent episodes of angina or shortness of breath.   ABI's Rt=1.10 and Lt=1.02  (previous ABI's Rt=0.81 and Lt=0.99) Duplex US of the right lower extremity shows triphasic waveforms with biphasic on the left.  Normal toe waveforms on the right with slightly dampened on the left   Review of Systems  Cardiovascular:  Positive for leg swelling.  All  other systems reviewed and are negative.     Objective:   Physical Exam Vitals reviewed.  HENT:     Head: Normocephalic.  Cardiovascular:     Rate and Rhythm: Normal rate.     Pulses:          Dorsalis pedis pulses are detected w/ Doppler on the right side and detected w/ Doppler on the left side.       Posterior tibial pulses are detected w/ Doppler on the right side and detected w/ Doppler on the left side.  Pulmonary:     Effort: Pulmonary effort is normal.  Skin:    General: Skin is warm and dry.  Neurological:     Mental Status: She is alert and oriented to person, place, and time.  Psychiatric:        Mood and Affect: Mood normal.        Behavior: Behavior normal.        Thought Content: Thought content normal.        Judgment: Judgment normal.    BP (!) 157/77 (BP Location: Left Arm)   Pulse 84   Resp 16   Ht '5\' 1"'$  (1.549 m)   BMI 18.14 kg/m   Past  Medical History:  Diagnosis Date   AK (actinic keratosis) 12/08/2020   right pretibia inferior bx proven, LN2 01/10/21   Burping    Chronic airway obstruction, not elsewhere classified    Dyspnea    Dysrhythmia    Macular degeneration (senile) of retina, unspecified    Malignant neoplasm of urinary bladder ([place]) 03/2019   Partial bladder resection and Rad tx's.    Obstructive chronic bronchitis with exacerbation (HCC)    Osteoporosis, unspecified    Oxygen deficiency    Personal history of peptic ulcer disease    SCC (squamous cell carcinoma) 12/08/2020   right pretibia superior, EDC 01/10/21   Tobacco use disorder    Unspecified essential hypertension    Unspecified glaucoma(365.9)     Social History   Socioeconomic History   Marital status: Married    Spouse name: Not on file   Number of children: 3   Years of education: Not on file   Highest education level: Not on file  Occupational History   Occupation: RETIRED    Employer: RETIRED  Tobacco Use   Smoking status: Former    Packs/day: 1.30     Years: 45.00    Pack years: 58.50    Types: Cigarettes    Quit date: 08/14/2003    Years since quitting: 18.3   Smokeless tobacco: Never  Vaping Use   Vaping Use: Never used  Substance and Sexual Activity   Alcohol use: Yes    Alcohol/week: 14.0 standard drinks    Types: 14 Glasses of wine per week    Comment: 1-2 wine daily   Drug use: No   Sexual activity: Yes    Birth control/protection: Post-menopausal  Other Topics Concern   Not on file  Social History Narrative   Tobacco abuse x 45 years   Social Determinants of Radio broadcast assistant Strain: Low Risk    Difficulty of Paying Living Expenses: Not very hard  Food Insecurity: No Food Insecurity   Worried About Charity fundraiser in the Last Year: Never true   Ran Out of Food in the Last Year: Never true  Transportation Needs: No Transportation Needs   Lack of Transportation (Medical): No   Lack of Transportation (Non-Medical): No  Physical Activity: Insufficiently Active   Days of Exercise per Week: 7 days   Minutes of Exercise per Session: 10 min  Stress: No Stress Concern Present   Feeling of Stress : Only a little  Social Connections: Moderately Isolated   Frequency of Communication with Friends and Family: More than three times a week   Frequency of Social Gatherings with Friends and Family: More than three times a week   Attends Religious Services: Never   Marine scientist or Organizations: No   Attends Music therapist: Never   Marital Status: Married  Human resources officer Violence: Not At Risk   Fear of Current or Ex-Partner: No   Emotionally Abused: No   Physically Abused: No   Sexually Abused: No    Past Surgical History:  Procedure Laterality Date   CATARACT EXTRACTION W/ INTRAOCULAR LENS  IMPLANT, BILATERAL     CYSTOSCOPY WITH STENT PLACEMENT Left 05/22/2019   Procedure: CYSTOSCOPY WITH STENT PLACEMENT;  Surgeon: Billey Co, MD;  Location: [facility];  Service: Urology;   Laterality: Left;   ELBOW FRACTURE SURGERY Left    LOWER EXTREMITY ANGIOGRAPHY Right 11/20/2021   Procedure: Lower Extremity Angiography;  Surgeon: Algernon Huxley, MD;  Location:  [facility];  Service: Cardiovascular;  Laterality: Right;   TRANSURETHRAL RESECTION OF BLADDER TUMOR N/A 05/22/2019   Procedure: TRANSURETHRAL RESECTION OF BLADDER TUMOR (TURBT);  Surgeon: Billey Co, MD;  Location: [facility];  Service: Urology;  Laterality: N/A;    Family History  Adopted: Yes    No Known Allergies     Latest Ref Rng & Units 11/06/2021   11:00 AM 04/30/2019    4:13 PM 10/22/2017    9:19 PM  CBC  WBC 4.0 - 10.5 K/uL 5.6   6.5   6.2    Hemoglobin 12.0 - 15.0 g/dL 13.0   12.4   12.5    Hematocrit 36.0 - 46.0 % 39.0   37.1   37.5    Platelets 150.0 - 400.0 K/uL 220.0   295   214        CMP     Component Value Date/Time   NA 142 11/06/2021 1100   NA 141 06/10/2019 1324   K 4.9 11/06/2021 1100   K 4.1 07/23/2012 1536   CL 103 11/06/2021 1100   CO2 32 11/06/2021 1100   GLUCOSE 108 (H) 11/06/2021 1100   BUN 23 11/20/2021 0935   BUN 15 06/10/2019 1324   CREATININE 0.93 11/20/2021 0935   CREATININE 1.26 (H) 12/23/2012 1440   CALCIUM 9.8 11/06/2021 1100   PROT 6.6 11/06/2021 1100   PROT 6.6 06/10/2019 1324   ALBUMIN 4.8 11/06/2021 1100   ALBUMIN 4.5 06/10/2019 1324   AST 27 11/06/2021 1100   ALT 33 11/06/2021 1100   ALKPHOS 66 11/06/2021 1100   BILITOT 0.5 11/06/2021 1100   BILITOT 0.4 06/10/2019 1324   GFRNONAA >60 11/20/2021 0935   GFRAA 62 06/10/2019 1324     VAS [place] ABI WITH/WO TBI  Result Date: 12/25/2021  LOWER EXTREMITY DOPPLER STUDY Patient Name:  JOSELYNN AMOROSO  Date of Exam:   12/20/2021 Medical Rec #: [id number]       Accession #:    [id number] Date of Birth: 10-06-1933      Patient Gender: F Patient Age:   88 years Exam Location:  [facility] Procedure:      VAS [place] ABI WITH/WO TBI Referring Phys: Leotis Pain  --------------------------------------------------------------------------------  Indications: Peripheral artery disease, and Decreased pulses; C/O bilateral heel              pain (R>L).  Vascular Interventions: 11/20/2021: Rt EIA/CIA Stent PTA RT SFA. Comparison Study: 11/07/2021 Performing Technologist: Concha Norway RVT  Examination Guidelines: A complete evaluation includes at minimum, Doppler waveform signals and systolic blood pressure reading at the level of bilateral brachial, anterior tibial, and posterior tibial arteries, when vessel segments are accessible. Bilateral testing is considered an integral part of a complete examination. Photoelectric Plethysmograph (PPG) waveforms and toe systolic pressure readings are included as required and additional duplex testing as needed. Limited examinations for reoccurring indications may be performed as noted.  ABI Findings: +---------+------------------+-----+---------+--------+ Right    Rt Pressure (mmHg)IndexWaveform Comment  +---------+------------------+-----+---------+--------+ Brachial 136                                      +---------+------------------+-----+---------+--------+ ATA      149               1.10 triphasic         +---------+------------------+-----+---------+--------+ PTA      137  1.01 triphasic         +---------+------------------+-----+---------+--------+ Great Toe133               0.98 Normal            +---------+------------------+-----+---------+--------+ +---------+------------------+-----+--------+-------+ Left     Lt Pressure (mmHg)IndexWaveformComment +---------+------------------+-----+--------+-------+ ATA      139               1.02 biphasic        +---------+------------------+-----+--------+-------+ PTA      134               0.99 biphasic        +---------+------------------+-----+--------+-------+ Ellen Henri               0.82 Abnormal         +---------+------------------+-----+--------+-------+ +-------+-----------+-----------+------------+------------+ ABI/TBIToday's ABIToday's TBIPrevious ABIPrevious TBI +-------+-----------+-----------+------------+------------+ Right  1.10       .98        .81         .12          +-------+-----------+-----------+------------+------------+ Left   1.02       .82        .99         .28          +-------+-----------+-----------+------------+------------+  Bilateral TBIs and ABIs appear increased compared to prior study on 11/07/2021.  Summary: Right: Resting right ankle-brachial index is within normal range. No evidence of significant right lower extremity arterial disease. The right toe-brachial index is normal. Left: Resting left ankle-brachial index is within normal range. No evidence of significant left lower extremity arterial disease. The left toe-brachial index is normal. *See table(s) above for measurements and observations.  Electronically signed by Leotis Pain MD on 12/25/2021 at 2:03:48 PM.    Final        Assessment & Plan:   1. Atherosclerosis of native artery of right leg with rest pain ([place]) Recommend:  The patient is status post successful angiogram with intervention.  The patient reports that the claudication symptoms and leg pain has improved.   The patient denies lifestyle limiting changes at this point in time.  No further invasive studies, angiography or surgery at this time  The patient should continue antiplatelet therapy and aggressive treatment of the lipid abnormalities  Continued surveillance is indicated as atherosclerosis is likely to progress with time.    Patient should undergo noninvasive studies as ordered. The patient will follow up with me to review the studies.   - VAS [place] ABI WITH/WO TBI  2. Essential hypertension, benign Continue antihypertensive medications as already ordered, these medications have been reviewed and there are no changes at this  time.   3. HYPERCHOLESTEROLEMIA Continue statin as ordered and reviewed, no changes at this time    Current Outpatient Medications on File Prior to Visit  Medication Sig Dispense Refill   ascorbic acid (VITAMIN C) 1000 MG tablet Take 1,000 mg by mouth daily.      aspirin EC 81 MG tablet Take 1 tablet (81 mg total) by mouth daily. 150 tablet 2   atorvastatin (LIPITOR) 10 MG tablet Take 1 tablet (10 mg total) by mouth daily. 30 tablet 11   budesonide-formoterol (SYMBICORT) 160-4.5 MCG/ACT inhaler Inhale 2 puffs into the lungs in the morning and at bedtime. 3 each 0   Cholecalciferol (VITAMIN D) 125 MCG (5000 UT) CAPS Take 5,000 Units by mouth daily.     clopidogrel (PLAVIX) 75 MG tablet Take 1 tablet (75 mg  total) by mouth daily. 30 tablet 11   docusate sodium (COLACE) 100 MG capsule Take 100 mg by mouth 2 (two) times daily.     losartan (COZAAR) 25 MG tablet 25 mg daily.      Multiple Vitamins-Minerals (PRESERVISION AREDS) TABS Take 1 tablet by mouth 2 (two) times daily.     Omega-3 Fatty Acids (FISH OIL) 1000 MG CAPS Take 1,000 mg by mouth 2 (two) times daily.      OXYGEN Inhale 4 L into the lungs as needed.     verapamil (VERELAN PM) 360 MG 24 hr capsule Take 360 mg by mouth daily.     Vitamin E 180 MG CAPS Take 180 mg by mouth daily.     No current facility-administered medications on file prior to visit.    There are no Patient Instructions on file for this visit. No follow-ups on file.   Kris Hartmann, NP

## 2022-01-01 ENCOUNTER — Ambulatory Visit (INDEPENDENT_AMBULATORY_CARE_PROVIDER_SITE_OTHER): Payer: Medicare HMO

## 2022-01-01 ENCOUNTER — Ambulatory Visit (INDEPENDENT_AMBULATORY_CARE_PROVIDER_SITE_OTHER): Payer: Medicare HMO | Admitting: Nurse Practitioner

## 2022-01-01 ENCOUNTER — Other Ambulatory Visit (INDEPENDENT_AMBULATORY_CARE_PROVIDER_SITE_OTHER): Payer: Self-pay | Admitting: Nurse Practitioner

## 2022-01-01 ENCOUNTER — Encounter (INDEPENDENT_AMBULATORY_CARE_PROVIDER_SITE_OTHER): Payer: Self-pay

## 2022-01-01 ENCOUNTER — Encounter (INDEPENDENT_AMBULATORY_CARE_PROVIDER_SITE_OTHER): Payer: Medicare HMO

## 2022-01-01 DIAGNOSIS — I10 Essential (primary) hypertension: Secondary | ICD-10-CM

## 2022-01-01 DIAGNOSIS — L819 Disorder of pigmentation, unspecified: Secondary | ICD-10-CM | POA: Diagnosis not present

## 2022-01-01 DIAGNOSIS — I70221 Atherosclerosis of native arteries of extremities with rest pain, right leg: Secondary | ICD-10-CM

## 2022-01-01 MED ORDER — GABAPENTIN 300 MG PO CAPS: 300.0000 mg | ORAL_CAPSULE | Freq: Every day | ORAL | 3 refills | Status: DC

## 2022-01-01 MED ORDER — FUROSEMIDE 20 MG PO TABS: 20.0000 mg | ORAL_TABLET | Freq: Every day | ORAL | 0 refills | Status: DC

## 2022-01-01 NOTE — Telephone Encounter (Signed)
T today at 1:30

## 2022-01-01 NOTE — Telephone Encounter (Signed)
Bring pt in for unna boots per Eulogio Ditch

## 2022-01-08 ENCOUNTER — Encounter (INDEPENDENT_AMBULATORY_CARE_PROVIDER_SITE_OTHER): Payer: Self-pay | Admitting: Nurse Practitioner

## 2022-01-08 NOTE — Progress Notes (Signed)
Subjective:    Patient ID: Misty Ferguson, female    DOB: 1934-03-20, 88 y.o.   MRN: [id number] No chief complaint on file.   Exudate.  Is a 88-year-old female that returns today for blisters and discoloration of her lower extremities following a right lower extremity angiogram.  The patient had some slight swelling initially but now she has more blisters appearing on the right lower extremity and a small area on her left.  There have been no open wounds or ulcerations but there is a significant amount of bruising noted.  Today noninvasive studies show the right calf area of concern has moderate edema but no evidence of DVT.  The left lower extremity has patent vessels throughout with some moderate atherosclerosis.   Review of Systems  Cardiovascular:  Positive for leg swelling.  Hematological:  Bruises/bleeds easily.  All other systems reviewed and are negative.     Objective:   Physical Exam Vitals reviewed.  HENT:     Head: Normocephalic.  Cardiovascular:     Rate and Rhythm: Normal rate.  Pulmonary:     Effort: Pulmonary effort is normal.  Musculoskeletal:     Right lower leg: Edema present.     Left lower leg: Edema present.  Skin:    General: Skin is warm and dry.  Neurological:     Mental Status: She is alert and oriented to person, place, and time.  Psychiatric:        Mood and Affect: Mood normal.        Behavior: Behavior normal.        Thought Content: Thought content normal.        Judgment: Judgment normal.    There were no vitals taken for this visit.  Past Medical History:  Diagnosis Date   AK (actinic keratosis) 12/08/2020   right pretibia inferior bx proven, LN2 01/10/21   Burping    Chronic airway obstruction, not elsewhere classified    Dyspnea    Dysrhythmia    Macular degeneration (senile) of retina, unspecified    Malignant neoplasm of urinary bladder ([place]) 03/2019   Partial bladder resection and Rad tx's.    Obstructive chronic bronchitis  with exacerbation (HCC)    Osteoporosis, unspecified    Oxygen deficiency    Personal history of peptic ulcer disease    SCC (squamous cell carcinoma) 12/08/2020   right pretibia superior, EDC 01/10/21   Tobacco use disorder    Unspecified essential hypertension    Unspecified glaucoma(365.9)     Social History   Socioeconomic History   Marital status: Married    Spouse name: Not on file   Number of children: 3   Years of education: Not on file   Highest education level: Not on file  Occupational History   Occupation: RETIRED    Employer: RETIRED  Tobacco Use   Smoking status: Former    Packs/day: 1.30    Years: 45.00    Pack years: 58.50    Types: Cigarettes    Quit date: 08/14/2003    Years since quitting: 18.4   Smokeless tobacco: Never  Vaping Use   Vaping Use: Never used  Substance and Sexual Activity   Alcohol use: Yes    Alcohol/week: 14.0 standard drinks    Types: 14 Glasses of wine per week    Comment: 1-2 wine daily   Drug use: No   Sexual activity: Yes    Birth control/protection: Post-menopausal  Other Topics Concern   Not  on file  Social History Narrative   Tobacco abuse x 45 years   Social Determinants of Radio broadcast assistant Strain: Low Risk    Difficulty of Paying Living Expenses: Not very hard  Food Insecurity: No Food Insecurity   Worried About Charity fundraiser in the Last Year: Never true   Arboriculturist in the Last Year: Never true  Transportation Needs: No Transportation Needs   Lack of Transportation (Medical): No   Lack of Transportation (Non-Medical): No  Physical Activity: Insufficiently Active   Days of Exercise per Week: 7 days   Minutes of Exercise per Session: 10 min  Stress: No Stress Concern Present   Feeling of Stress : Only a little  Social Connections: Moderately Isolated   Frequency of Communication with Friends and Family: More than three times a week   Frequency of Social Gatherings with Friends and Family:  More than three times a week   Attends Religious Services: Never   Marine scientist or Organizations: No   Attends Music therapist: Never   Marital Status: Married  Human resources officer Violence: Not At Risk   Fear of Current or Ex-Partner: No   Emotionally Abused: No   Physically Abused: No   Sexually Abused: No    Past Surgical History:  Procedure Laterality Date   CATARACT EXTRACTION W/ INTRAOCULAR LENS  IMPLANT, BILATERAL     CYSTOSCOPY WITH STENT PLACEMENT Left 05/22/2019   Procedure: CYSTOSCOPY WITH STENT PLACEMENT;  Surgeon: Billey Co, MD;  Location: [facility];  Service: Urology;  Laterality: Left;   ELBOW FRACTURE SURGERY Left    LOWER EXTREMITY ANGIOGRAPHY Right 11/20/2021   Procedure: Lower Extremity Angiography;  Surgeon: Algernon Huxley, MD;  Location: [facility];  Service: Cardiovascular;  Laterality: Right;   TRANSURETHRAL RESECTION OF BLADDER TUMOR N/A 05/22/2019   Procedure: TRANSURETHRAL RESECTION OF BLADDER TUMOR (TURBT);  Surgeon: Billey Co, MD;  Location: [facility];  Service: Urology;  Laterality: N/A;    Family History  Adopted: Yes    No Known Allergies     Latest Ref Rng & Units 11/06/2021   11:00 AM 04/30/2019    4:13 PM 10/22/2017    9:19 PM  CBC  WBC 4.0 - 10.5 K/uL 5.6   6.5   6.2    Hemoglobin 12.0 - 15.0 g/dL 13.0   12.4   12.5    Hematocrit 36.0 - 46.0 % 39.0   37.1   37.5    Platelets 150.0 - 400.0 K/uL 220.0   295   214        CMP     Component Value Date/Time   NA 142 11/06/2021 1100   NA 141 06/10/2019 1324   K 4.9 11/06/2021 1100   K 4.1 07/23/2012 1536   CL 103 11/06/2021 1100   CO2 32 11/06/2021 1100   GLUCOSE 108 (H) 11/06/2021 1100   BUN 23 11/20/2021 0935   BUN 15 06/10/2019 1324   CREATININE 0.93 11/20/2021 0935   CREATININE 1.26 (H) 12/23/2012 1440   CALCIUM 9.8 11/06/2021 1100   PROT 6.6 11/06/2021 1100   PROT 6.6 06/10/2019 1324   ALBUMIN 4.8 11/06/2021 1100   ALBUMIN 4.5 06/10/2019  1324   AST 27 11/06/2021 1100   ALT 33 11/06/2021 1100   ALKPHOS 66 11/06/2021 1100   BILITOT 0.5 11/06/2021 1100   BILITOT 0.4 06/10/2019 1324   GFRNONAA >60 11/20/2021 0935   GFRAA  62 06/10/2019 1324     VAS [place] ABI WITH/WO TBI  Result Date: 12/25/2021  LOWER EXTREMITY DOPPLER STUDY Patient Name:  Misty Ferguson  Date of Exam:   12/20/2021 Medical Rec #: [id number]       Accession #:    [id number] Date of Birth: 04-18-1934      Patient Gender: F Patient Age:   53 years Exam Location:  [facility] Procedure:      VAS [place] ABI WITH/WO TBI Referring Phys: Leotis Pain --------------------------------------------------------------------------------  Indications: Peripheral artery disease, and Decreased pulses; C/O bilateral heel              pain (R>L).  Vascular Interventions: 11/20/2021: Rt EIA/CIA Stent PTA RT SFA. Comparison Study: 11/07/2021 Performing Technologist: Concha Norway RVT  Examination Guidelines: A complete evaluation includes at minimum, Doppler waveform signals and systolic blood pressure reading at the level of bilateral brachial, anterior tibial, and posterior tibial arteries, when vessel segments are accessible. Bilateral testing is considered an integral part of a complete examination. Photoelectric Plethysmograph (PPG) waveforms and toe systolic pressure readings are included as required and additional duplex testing as needed. Limited examinations for reoccurring indications may be performed as noted.  ABI Findings: +---------+------------------+-----+---------+--------+ Right    Rt Pressure (mmHg)IndexWaveform Comment  +---------+------------------+-----+---------+--------+ Brachial 136                                      +---------+------------------+-----+---------+--------+ ATA      149               1.10 triphasic         +---------+------------------+-----+---------+--------+ PTA      137               1.01 triphasic          +---------+------------------+-----+---------+--------+ Great Toe133               0.98 Normal            +---------+------------------+-----+---------+--------+ +---------+------------------+-----+--------+-------+ Left     Lt Pressure (mmHg)IndexWaveformComment +---------+------------------+-----+--------+-------+ ATA      139               1.02 biphasic        +---------+------------------+-----+--------+-------+ PTA      134               0.99 biphasic        +---------+------------------+-----+--------+-------+ Ellen Henri               0.82 Abnormal        +---------+------------------+-----+--------+-------+ +-------+-----------+-----------+------------+------------+ ABI/TBIToday's ABIToday's TBIPrevious ABIPrevious TBI +-------+-----------+-----------+------------+------------+ Right  1.10       .98        .81         .12          +-------+-----------+-----------+------------+------------+ Left   1.02       .82        .99         .28          +-------+-----------+-----------+------------+------------+  Bilateral TBIs and ABIs appear increased compared to prior study on 11/07/2021.  Summary: Right: Resting right ankle-brachial index is within normal range. No evidence of significant right lower extremity arterial disease. The right toe-brachial index is normal. Left: Resting left ankle-brachial index is within normal range. No evidence of significant left lower extremity arterial disease. The left toe-brachial index is  normal. *See table(s) above for measurements and observations.  Electronically signed by Leotis Pain MD on 12/25/2021 at 2:03:48 PM.    Final        Assessment & Plan:   1. Discoloration of skin of foot I suspect that the reason for the drastic blistering is because the patient is very petite and has very little body fat which would typically only resulted in edema.  However in this patient's case it is presenting as blisters.  We will do a  small dose of Lasix as well as have the patient wrap her lower extremities and Ace bandages to help with the swelling.  The patient also has some numbness of the lower extremities which I suspect is from neuropathy due to decreased perfusion for an extended time.  Based on this we will also send the patient some Neurontin to help with this.  We will have her return in 2 weeks  2. Atherosclerosis of native artery of right leg with rest pain (HCC) The patient's lower extremity symptoms are much improved.  Noninvasive studies today show that intervention on the left lower extremity may be necessary however, I believe that we should allow the patient to heal from this intervention prior to moving forward with any intervention on the left lower extremity as she is not especially symptomatic currently.  3. Essential hypertension, benign Continue antihypertensive medications as already ordered, these medications have been reviewed and there are no changes at this time.    Current Outpatient Medications on File Prior to Visit  Medication Sig Dispense Refill   ascorbic acid (VITAMIN C) 1000 MG tablet Take 1,000 mg by mouth daily.      aspirin EC 81 MG tablet Take 1 tablet (81 mg total) by mouth daily. 150 tablet 2   atorvastatin (LIPITOR) 10 MG tablet Take 1 tablet (10 mg total) by mouth daily. 30 tablet 11   budesonide-formoterol (SYMBICORT) 160-4.5 MCG/ACT inhaler Inhale 2 puffs into the lungs in the morning and at bedtime. 3 each 0   Cholecalciferol (VITAMIN D) 125 MCG (5000 UT) CAPS Take 5,000 Units by mouth daily.     clopidogrel (PLAVIX) 75 MG tablet Take 1 tablet (75 mg total) by mouth daily. 30 tablet 11   docusate sodium (COLACE) 100 MG capsule Take 100 mg by mouth 2 (two) times daily.     furosemide (LASIX) 20 MG tablet Take 1 tablet (20 mg total) by mouth daily. 7 tablet 0   gabapentin (NEURONTIN) 300 MG capsule Take 1 capsule (300 mg total) by mouth at bedtime. 30 capsule 3   losartan (COZAAR)  25 MG tablet 25 mg daily.      Multiple Vitamins-Minerals (PRESERVISION AREDS) TABS Take 1 tablet by mouth 2 (two) times daily.     Omega-3 Fatty Acids (FISH OIL) 1000 MG CAPS Take 1,000 mg by mouth 2 (two) times daily.      OXYGEN Inhale 4 L into the lungs as needed.     verapamil (VERELAN PM) 360 MG 24 hr capsule Take 360 mg by mouth daily.     Vitamin E 180 MG CAPS Take 180 mg by mouth daily.     No current facility-administered medications on file prior to visit.    There are no Patient Instructions on file for this visit. No follow-ups on file.   Kris Hartmann, NP

## 2022-01-16 ENCOUNTER — Ambulatory Visit (INDEPENDENT_AMBULATORY_CARE_PROVIDER_SITE_OTHER): Payer: Medicare HMO | Admitting: Vascular Surgery

## 2022-01-16 ENCOUNTER — Encounter (INDEPENDENT_AMBULATORY_CARE_PROVIDER_SITE_OTHER): Payer: Self-pay | Admitting: Vascular Surgery

## 2022-01-16 VITALS — BP 163/76 | HR 76 | Resp 15

## 2022-01-16 DIAGNOSIS — E7800 Pure hypercholesterolemia, unspecified: Secondary | ICD-10-CM | POA: Diagnosis not present

## 2022-01-16 DIAGNOSIS — I10 Essential (primary) hypertension: Secondary | ICD-10-CM | POA: Diagnosis not present

## 2022-01-16 DIAGNOSIS — I739 Peripheral vascular disease, unspecified: Secondary | ICD-10-CM

## 2022-01-16 DIAGNOSIS — R7303 Prediabetes: Secondary | ICD-10-CM | POA: Diagnosis not present

## 2022-01-16 NOTE — Progress Notes (Signed)
MRN : [id number]  Misty Ferguson is a 88 y.o. ([birth year]) female who presents with chief complaint of  Chief Complaint  Patient presents with   Follow-up    2 week follow up  .  History of Present Illness: Patient returns today in follow up of her PAD and reperfusion swelling of the right leg.  She has been wrapping her legs and elevating them consistently and this has markedly reduce the swelling.  She is not having the ischemic pain she was having prior to the revascularization on the right leg.  She also had some iliac worked on the left leg.  She had studies couple weeks ago and her perfusion is markedly improved.  All in all, she seems to be doing reasonably well.  She does have a lot of skin darkening and bruising, but no open wounds or signs of infection.  Current Outpatient Medications  Medication Sig Dispense Refill   ascorbic acid (VITAMIN C) 1000 MG tablet Take 1,000 mg by mouth daily.      aspirin EC 81 MG tablet Take 1 tablet (81 mg total) by mouth daily. 150 tablet 2   atorvastatin (LIPITOR) 10 MG tablet Take 1 tablet (10 mg total) by mouth daily. 30 tablet 11   budesonide-formoterol (SYMBICORT) 160-4.5 MCG/ACT inhaler Inhale 2 puffs into the lungs in the morning and at bedtime. 3 each 0   Cholecalciferol (VITAMIN D) 125 MCG (5000 UT) CAPS Take 5,000 Units by mouth daily.     clopidogrel (PLAVIX) 75 MG tablet Take 1 tablet (75 mg total) by mouth daily. 30 tablet 11   docusate sodium (COLACE) 100 MG capsule Take 100 mg by mouth 2 (two) times daily.     gabapentin (NEURONTIN) 300 MG capsule Take 1 capsule (300 mg total) by mouth at bedtime. 30 capsule 3   losartan (COZAAR) 25 MG tablet 25 mg daily.      Multiple Vitamins-Minerals (PRESERVISION AREDS) TABS Take 1 tablet by mouth 2 (two) times daily.     Omega-3 Fatty Acids (FISH OIL) 1000 MG CAPS Take 1,000 mg by mouth 2 (two) times daily.      OXYGEN Inhale 4 L into the lungs as needed.     verapamil (VERELAN PM) 360 MG 24  hr capsule Take 360 mg by mouth daily.     Vitamin E 180 MG CAPS Take 180 mg by mouth daily.     furosemide (LASIX) 20 MG tablet Take 1 tablet (20 mg total) by mouth daily. (Patient not taking: Reported on 01/16/2022) 7 tablet 0   No current facility-administered medications for this visit.    Past Medical History:  Diagnosis Date   AK (actinic keratosis) 12/08/2020   right pretibia inferior bx proven, LN2 01/10/21   Burping    Chronic airway obstruction, not elsewhere classified    Dyspnea    Dysrhythmia    Macular degeneration (senile) of retina, unspecified    Malignant neoplasm of urinary bladder ([place]) 03/2019   Partial bladder resection and Rad tx's.    Obstructive chronic bronchitis with exacerbation (HCC)    Osteoporosis, unspecified    Oxygen deficiency    Personal history of peptic ulcer disease    SCC (squamous cell carcinoma) 12/08/2020   right pretibia superior, EDC 01/10/21   Tobacco use disorder    Unspecified essential hypertension    Unspecified glaucoma(365.9)     Past Surgical History:  Procedure Laterality Date   CATARACT EXTRACTION W/ INTRAOCULAR LENS  IMPLANT, BILATERAL  CYSTOSCOPY WITH STENT PLACEMENT Left 05/22/2019   Procedure: CYSTOSCOPY WITH STENT PLACEMENT;  Surgeon: Billey Co, MD;  Location: [facility];  Service: Urology;  Laterality: Left;   ELBOW FRACTURE SURGERY Left    LOWER EXTREMITY ANGIOGRAPHY Right 11/20/2021   Procedure: Lower Extremity Angiography;  Surgeon: Algernon Huxley, MD;  Location: [facility];  Service: Cardiovascular;  Laterality: Right;   TRANSURETHRAL RESECTION OF BLADDER TUMOR N/A 05/22/2019   Procedure: TRANSURETHRAL RESECTION OF BLADDER TUMOR (TURBT);  Surgeon: Billey Co, MD;  Location: [facility];  Service: Urology;  Laterality: N/A;     Social History   Tobacco Use   Smoking status: Former    Packs/day: 1.30    Years: 45.00    Pack years: 58.50    Types: Cigarettes    Quit date: 08/14/2003    Years  since quitting: 18.4   Smokeless tobacco: Never  Vaping Use   Vaping Use: Never used  Substance Use Topics   Alcohol use: Yes    Alcohol/week: 14.0 standard drinks    Types: 14 Glasses of wine per week    Comment: 1-2 wine daily   Drug use: No       Family History  Adopted: Yes     No Known Allergies   REVIEW OF SYSTEMS (Negative unless checked)  Constitutional: '[]'$ Weight loss  '[]'$ Fever  '[]'$ Chills Cardiac: '[]'$ Chest pain   '[]'$ Chest pressure   '[]'$ Palpitations   '[]'$ Shortness of breath when laying flat   '[]'$ Shortness of breath at rest   '[]'$ Shortness of breath with exertion. Vascular:  '[]'$ Pain in legs with walking   '[]'$ Pain in legs at rest   '[]'$ Pain in legs when laying flat   '[]'$ Claudication   '[]'$ Pain in feet when walking  '[]'$ Pain in feet at rest  '[]'$ Pain in feet when laying flat   '[]'$ History of DVT   '[]'$ Phlebitis   '[x]'$ Swelling in legs   '[]'$ Varicose veins   '[]'$ Non-healing ulcers Pulmonary:   '[]'$ Uses home oxygen   '[]'$ Productive cough   '[]'$ Hemoptysis   '[]'$ Wheeze  '[]'$ COPD   '[]'$ Asthma Neurologic:  '[]'$ Dizziness  '[]'$ Blackouts   '[]'$ Seizures   '[]'$ History of stroke   '[]'$ History of TIA  '[]'$ Aphasia   '[]'$ Temporary blindness   '[]'$ Dysphagia   '[]'$ Weakness or numbness in arms   '[]'$ Weakness or numbness in legs Musculoskeletal:  '[x]'$ Arthritis   '[]'$ Joint swelling   '[x]'$ Joint pain   '[]'$ Low back pain Hematologic:  '[x]'$ Easy bruising  '[]'$ Easy bleeding   '[]'$ Hypercoagulable state   '[]'$ Anemic   Gastrointestinal:  '[]'$ Blood in stool   '[]'$ Vomiting blood  '[]'$ Gastroesophageal reflux/heartburn   '[]'$ Abdominal pain Genitourinary:  '[]'$ Chronic kidney disease   '[]'$ Difficult urination  '[]'$ Frequent urination  '[]'$ Burning with urination   '[]'$ Hematuria Skin:  '[]'$ Rashes   '[]'$ Ulcers   '[]'$ Wounds Psychological:  '[]'$ History of anxiety   '[]'$  History of major depression.  Physical Examination  BP (!) 163/76 (BP Location: Left Arm)   Pulse 76   Resp 15  Gen:  WD/WN, NAD Head: [place]/AT, No temporalis wasting. Ear/Nose/Throat: Hearing grossly intact, nares w/o erythema or drainage Eyes:  Conjunctiva clear. Sclera non-icteric Neck: Supple.  Trachea midline Pulmonary:  Good air movement, no use of accessory muscles.  Cardiac: irregular Vascular:  Vessel Right Left  Radial Palpable Palpable                          PT Trace Palpable 1+ Palpable  DP 2+ Palpable 1+ Palpable   Gastrointestinal: soft, non-tender/non-distended. No guarding/reflex.  Musculoskeletal: M/S 5/5 throughout.  No deformity or atrophy. 1+ BLE edema.  Uses a wheelchair Neurologic: Sensation grossly intact in extremities.  Symmetrical.  Speech is fluent.  Psychiatric: Judgment intact, Mood & affect appropriate for pt's clinical situation. Dermatologic: Marked stasis changes are present in both lower extremities      Labs Recent Results (from the past 2160 hour(s))  CBC     Status: None   Collection Time: 11/06/21 11:00 AM  Result Value Ref Range   WBC 5.6 4.0 - 10.5 K/uL   RBC 4.16 3.87 - 5.11 Mil/uL   Platelets 220.0 150.0 - 400.0 K/uL   Hemoglobin 13.0 12.0 - 15.0 g/dL   HCT 39.0 36.0 - 46.0 %   MCV 93.9 78.0 - 100.0 fl   MCHC 33.4 30.0 - 36.0 g/dL   RDW 12.9 11.5 - 15.5 %  Hemoglobin A1c     Status: None   Collection Time: 11/06/21 11:00 AM  Result Value Ref Range   Hgb A1c MFr Bld 5.9 4.6 - 6.5 %    Comment: Glycemic Control Guidelines for People with Diabetes:Non Diabetic:  <6%Goal of Therapy: <7%Additional Action Suggested:  >8%   Lipid panel     Status: Abnormal   Collection Time: 11/06/21 11:00 AM  Result Value Ref Range   Cholesterol 249 (H) 0 - 200 mg/dL    Comment: ATP III Classification       Desirable:  < 200 mg/dL               Borderline High:  200 - 239 mg/dL          High:  > = 240 mg/dL   Triglycerides 82.0 0.0 - 149.0 mg/dL    Comment: Normal:  <150 mg/dLBorderline High:  150 - 199 mg/dL   HDL 128.30 >39.00 mg/dL   VLDL 16.4 0.0 - 40.0 mg/dL   LDL Cholesterol 104 (H) 0 - 99 mg/dL   Total CHOL/HDL Ratio 2     Comment:                Men          Women1/2 Average  Risk     3.4          3.3Average Risk          5.0          4.42X Average Risk          9.6          7.13X Average Risk          15.0          11.0                       NonHDL 120.23     Comment: NOTE:  Non-HDL goal should be 30 mg/dL higher than patient's LDL goal (i.e. LDL goal of < 70 mg/dL, would have non-HDL goal of < 100 mg/dL)  Comprehensive metabolic panel     Status: Abnormal   Collection Time: 11/06/21 11:00 AM  Result Value Ref Range   Sodium 142 135 - 145 mEq/L   Potassium 4.9 3.5 - 5.1 mEq/L   Chloride 103 96 - 112 mEq/L   CO2 32 19 - 32 mEq/L   Glucose, Bld 108 (H) 70 - 99 mg/dL   BUN 21 6 - 23 mg/dL   Creatinine, Ser 0.83 0.40 - 1.20 mg/dL   Total Bilirubin 0.5 0.2 - 1.2 mg/dL  Alkaline Phosphatase 66 39 - 117 U/L   AST 27 0 - 37 U/L   ALT 33 0 - 35 U/L   Total Protein 6.6 6.0 - 8.3 g/dL   Albumin 4.8 3.5 - 5.2 g/dL   GFR 64.28 >60.00 mL/min    Comment: Calculated using the CKD-EPI Creatinine Equation (2021)   Calcium 9.8 8.4 - 10.5 mg/dL  BUN     Status: None   Collection Time: 11/20/21  9:35 AM  Result Value Ref Range   BUN 23 8 - 23 mg/dL    Comment: Performed at [facility], [address] [place], [place] [address]  Creatinine, serum     Status: None   Collection Time: 11/20/21  9:35 AM  Result Value Ref Range   Creatinine, Ser 0.93 0.44 - 1.00 mg/dL   GFR, Estimated >60 >60 mL/min    Comment: (NOTE) Calculated using the CKD-EPI Creatinine Equation (2021) Performed at [facility], [address] [place],  [address]     Radiology VAS [place] ABI WITH/WO TBI  Result Date: 12/25/2021  [place] STUDY Patient Name:  ABBY TUCHOLSKI  Date of Exam:   12/20/2021 Medical Rec #: [id number]       Accession #:    [id number] Date of Birth: [birth year]      Patient Gender: F Patient Age:   33 years Exam Location:  [facility] Procedure:      VAS [place] ABI WITH/WO TBI Referring Phys: Leotis Pain  --------------------------------------------------------------------------------  Indications: Peripheral artery disease, and Decreased pulses; C/O bilateral heel              pain (R>L).  Vascular Interventions: 11/20/2021: Rt EIA/CIA Stent PTA RT SFA. Comparison Study: 11/07/2021 Performing Technologist: Concha Norway RVT  Examination Guidelines: A complete evaluation includes at minimum, Doppler waveform signals and systolic blood pressure reading at the level of bilateral brachial, anterior tibial, and posterior tibial arteries, when vessel segments are accessible. Bilateral testing is considered an integral part of a complete examination. Photoelectric Plethysmograph (PPG) waveforms and toe systolic pressure readings are included as required and additional duplex testing as needed. Limited examinations for reoccurring indications may be performed as noted.  ABI Findings: +---------+------------------+-----+---------+--------+ Right    Rt Pressure (mmHg)IndexWaveform Comment  +---------+------------------+-----+---------+--------+ Brachial 136                                      +---------+------------------+-----+---------+--------+ ATA      149               1.10 triphasic         +---------+------------------+-----+---------+--------+ PTA      137               1.01 triphasic         +---------+------------------+-----+---------+--------+ Great Toe133               0.98 Normal            +---------+------------------+-----+---------+--------+ +---------+------------------+-----+--------+-------+ Left     Lt Pressure (mmHg)IndexWaveformComment +---------+------------------+-----+--------+-------+ ATA      139               1.02 biphasic        +---------+------------------+-----+--------+-------+ PTA      134               0.99 biphasic        +---------+------------------+-----+--------+-------+  Great Toe112               0.82 Abnormal         +---------+------------------+-----+--------+-------+ +-------+-----------+-----------+------------+------------+ ABI/TBIToday's ABIToday's TBIPrevious ABIPrevious TBI +-------+-----------+-----------+------------+------------+ Right  1.10       .98        .81         .12          +-------+-----------+-----------+------------+------------+ Left   1.02       .82        .99         .28          +-------+-----------+-----------+------------+------------+  Bilateral TBIs and ABIs appear increased compared to prior study on 11/07/2021.  Summary: Right: Resting right ankle-brachial index is within normal range. No evidence of significant right lower extremity arterial disease. The right toe-brachial index is normal. Left: Resting left ankle-brachial index is within normal range. No evidence of significant left lower extremity arterial disease. The left toe-brachial index is normal. *See table(s) above for measurements and observations.  Electronically signed by Leotis Pain MD on 12/25/2021 at 2:03:48 PM.    Final    VAS [place] LOWER EXTREMITY ARTERIAL DUPLEX  Result Date: 01/04/2022 LOWER EXTREMITY ARTERIAL DUPLEX STUDY Patient Name:  RHAELYN GIRON  Date of Exam:   01/01/2022 Medical Rec #: [id number]       Accession #:    [id number] Date of Birth: 17-Nov-1933      Patient Gender: F Patient Age:   6 years Exam Location:  [facility] Procedure:      VAS [place] LOWER EXTREMITY ARTERIAL DUPLEX Referring Phys: Eulogio Ditch --------------------------------------------------------------------------------  Indications: Peripheral artery disease.  Current ABI: n/a Performing Technologist: Concha Norway RVT  Examination Guidelines: A complete evaluation includes B-mode imaging, spectral Doppler, color Doppler, and power Doppler as needed of all accessible portions of each vessel. Bilateral testing is considered an integral part of a complete examination. Limited examinations for reoccurring indications may be  performed as noted.   +----------+--------+-----+--------+----------+--------+ LEFT      PSV cm/sRatioStenosisWaveform  Comments +----------+--------+-----+--------+----------+--------+ CFA Mid   370                  monophasic         +----------+--------+-----+--------+----------+--------+ DFA       43                   monophasic         +----------+--------+-----+--------+----------+--------+ SFA Prox  69                   biphasic           +----------+--------+-----+--------+----------+--------+ SFA Mid   33                   biphasic           +----------+--------+-----+--------+----------+--------+ SFA Distal28                   biphasic           +----------+--------+-----+--------+----------+--------+ POP Distal30                   biphasic           +----------+--------+-----+--------+----------+--------+ ATA Distal18                   monophasic         +----------+--------+-----+--------+----------+--------+ PTA Distal27  biphasic           +----------+--------+-----+--------+----------+--------+  Summary: Right: Rt calf area of concern shows moderate edema. Left: Patent Left lea vessels throughout. Moderate atherosclerosis. Monophasic flow distally.  See table(s) above for measurements and observations. Electronically signed by Hortencia Pilar MD on 01/04/2022 at 7:08:50 PM.    Final     Assessment/Plan  Essential hypertension, benign blood pressure control important in reducing the progression of atherosclerotic disease. On appropriate oral medications.   HYPERCHOLESTEROLEMIA lipid control important in reducing the progression of atherosclerotic disease. Continue statin therapy   Prediabetes blood glucose control important in reducing the progression of atherosclerotic disease. Also, involved in wound healing. On appropriate medications.   PVD (peripheral vascular disease) ([place]) Perfusion is markedly  improved and her rest pain is better.  Reperfusion swelling has gone down.  Is still using some neuropathy for neuropathic pain and can do that as night and we can refill this as needed.  Return in 3 months with ABIs.  Continue current medical regimen.    Leotis Pain, MD  01/16/2022 10:37 AM    This note was created with Dragon medical transcription system.  Any errors from dictation are purely unintentional

## 2022-01-16 NOTE — Assessment & Plan Note (Signed)
Perfusion is markedly improved and her rest pain is better.  Reperfusion swelling has gone down.  Is still using some neuropathy for neuropathic pain and can do that as night and we can refill this as needed.  Return in 3 months with ABIs.  Continue current medical regimen.

## 2022-01-16 NOTE — Assessment & Plan Note (Signed)
lipid control important in reducing the progression of atherosclerotic disease. Continue statin therapy  

## 2022-01-16 NOTE — Assessment & Plan Note (Signed)
blood pressure control important in reducing the progression of atherosclerotic disease. On appropriate oral medications.  

## 2022-01-16 NOTE — Assessment & Plan Note (Signed)
blood glucose control important in reducing the progression of atherosclerotic disease. Also, involved in wound healing. On appropriate medications.  

## 2022-01-17 DIAGNOSIS — H353133 Nonexudative age-related macular degeneration, bilateral, advanced atrophic without subfoveal involvement: Secondary | ICD-10-CM | POA: Diagnosis not present

## 2022-01-23 ENCOUNTER — Ambulatory Visit (INDEPENDENT_AMBULATORY_CARE_PROVIDER_SITE_OTHER)
Admission: RE | Admit: 2022-01-23 | Discharge: 2022-01-23 | Disposition: A | Discharge status: Home / Self Care | Payer: Medicare HMO | Source: Ambulatory Visit | Attending: Family | Admitting: Family

## 2022-01-23 ENCOUNTER — Ambulatory Visit (INDEPENDENT_AMBULATORY_CARE_PROVIDER_SITE_OTHER): Payer: Medicare HMO | Admitting: Family

## 2022-01-23 ENCOUNTER — Encounter: Payer: Self-pay | Admitting: Family

## 2022-01-23 VITALS — BP 124/74 | HR 74 | Temp 97.7°F | Ht 61.0 in | Wt 95.0 lb

## 2022-01-23 DIAGNOSIS — R062 Wheezing: Secondary | ICD-10-CM | POA: Diagnosis not present

## 2022-01-23 DIAGNOSIS — J40 Bronchitis, not specified as acute or chronic: Secondary | ICD-10-CM | POA: Diagnosis not present

## 2022-01-23 DIAGNOSIS — R051 Acute cough: Secondary | ICD-10-CM | POA: Insufficient documentation

## 2022-01-23 DIAGNOSIS — R059 Cough, unspecified: Secondary | ICD-10-CM | POA: Diagnosis not present

## 2022-01-23 DIAGNOSIS — J439 Emphysema, unspecified: Secondary | ICD-10-CM | POA: Diagnosis not present

## 2022-01-23 MED ORDER — DOXYCYCLINE HYCLATE 100 MG PO TABS: 100.0000 mg | ORAL_TABLET | Freq: Two times a day (BID) | ORAL | 0 refills | Status: AC

## 2022-01-23 NOTE — Progress Notes (Signed)
Established Patient Office Visit  Subjective:  Patient ID: Misty Ferguson, female    DOB: Mar 31, 1934  Age: 88 y.o. MRN: [id number]  CC:  Chief Complaint  Patient presents with   COPD    Cough x 1 mo    HPI Misty Ferguson is here today with concerns.   About one month ago after eating dinner she felt she aspirated down into her lungs. She thought it would improve, but has not since. She feels she has a barking cough. Sometimes productive. She does have copd as well. No chest congestion. No increasing sob.   No sinus pressure no ear pain no sore throat.  No sneezing.   Symbicort daily.  Oxygen 4 L prn, usually wears 24/7   Past Medical History:  Diagnosis Date   AK (actinic keratosis) 12/08/2020   right pretibia inferior bx proven, LN2 01/10/21   Burping    Chronic airway obstruction, not elsewhere classified    Dyspnea    Dysrhythmia    Macular degeneration (senile) of retina, unspecified    Malignant neoplasm of urinary bladder ([place]) 03/2019   Partial bladder resection and Rad tx's.    Obstructive chronic bronchitis with exacerbation (HCC)    Osteoporosis, unspecified    Oxygen deficiency    Personal history of peptic ulcer disease    SCC (squamous cell carcinoma) 12/08/2020   right pretibia superior, EDC 01/10/21   Tobacco use disorder    Unspecified essential hypertension    Unspecified glaucoma(365.9)     Past Surgical History:  Procedure Laterality Date   CATARACT EXTRACTION W/ INTRAOCULAR LENS  IMPLANT, BILATERAL     CYSTOSCOPY WITH STENT PLACEMENT Left 05/22/2019   Procedure: CYSTOSCOPY WITH STENT PLACEMENT;  Surgeon: Billey Co, MD;  Location: [facility];  Service: Urology;  Laterality: Left;   ELBOW FRACTURE SURGERY Left    LOWER EXTREMITY ANGIOGRAPHY Right 11/20/2021   Procedure: Lower Extremity Angiography;  Surgeon: Algernon Huxley, MD;  Location: [place] [facility];  Service: Cardiovascular;  Laterality: Right;   TRANSURETHRAL RESECTION OF BLADDER  TUMOR N/A 05/22/2019   Procedure: TRANSURETHRAL RESECTION OF BLADDER TUMOR (TURBT);  Surgeon: Billey Co, MD;  Location: [facility];  Service: Urology;  Laterality: N/A;    Family History  Adopted: Yes    Social History   Socioeconomic History   Marital status: Married    Spouse name: Not on file   Number of children: 3   Years of education: Not on file   Highest education level: Not on file  Occupational History   Occupation: RETIRED    Employer: RETIRED  Tobacco Use   Smoking status: Former    Packs/day: 1.30    Years: 45.00    Total pack years: 58.50    Types: Cigarettes    Quit date: 08/14/2003    Years since quitting: 18.4   Smokeless tobacco: Never  Vaping Use   Vaping Use: Never used  Substance and Sexual Activity   Alcohol use: Yes    Alcohol/week: 14.0 standard drinks of alcohol    Types: 14 Glasses of wine per week    Comment: 1-2 wine daily   Drug use: No   Sexual activity: Yes    Birth control/protection: Post-menopausal  Other Topics Concern   Not on file  Social History Narrative   Tobacco abuse x 45 years   Social Determinants of Health   Financial Resource Strain: Low Risk  (11/07/2021)   Overall Financial Resource Strain (CARDIA)  Difficulty of Paying Living Expenses: Not very hard  Food Insecurity: No Food Insecurity (11/07/2021)   Hunger Vital Sign    Worried About Running Out of Food in the Last Year: Never true    Ran Out of Food in the Last Year: Never true  Transportation Needs: No Transportation Needs (11/07/2021)   PRAPARE - Hydrologist (Medical): No    Lack of Transportation (Non-Medical): No  Physical Activity: Insufficiently Active (11/07/2021)   Exercise Vital Sign    Days of Exercise per Week: 7 days    Minutes of Exercise per Session: 10 min  Stress: No Stress Concern Present (11/07/2021)   Standard    Feeling of Stress : Only a  little  Social Connections: Moderately Isolated (11/07/2021)   Social Connection and Isolation Panel [NHANES]    Frequency of Communication with Friends and Family: More than three times a week    Frequency of Social Gatherings with Friends and Family: More than three times a week    Attends Religious Services: Never    Marine scientist or Organizations: No    Attends Archivist Meetings: Never    Marital Status: Married  Human resources officer Violence: Not At Risk (11/07/2021)   Humiliation, Afraid, Rape, and Kick questionnaire    Fear of Current or Ex-Partner: No    Emotionally Abused: No    Physically Abused: No    Sexually Abused: No    Outpatient Medications Prior to Visit  Medication Sig Dispense Refill   ascorbic acid (VITAMIN C) 1000 MG tablet Take 1,000 mg by mouth daily.      aspirin EC 81 MG tablet Take 1 tablet (81 mg total) by mouth daily. 150 tablet 2   atorvastatin (LIPITOR) 10 MG tablet Take 1 tablet (10 mg total) by mouth daily. 30 tablet 11   budesonide-formoterol (SYMBICORT) 160-4.5 MCG/ACT inhaler Inhale 2 puffs into the lungs in the morning and at bedtime. 3 each 0   Cholecalciferol (VITAMIN D) 125 MCG (5000 UT) CAPS Take 5,000 Units by mouth daily.     clopidogrel (PLAVIX) 75 MG tablet Take 1 tablet (75 mg total) by mouth daily. 30 tablet 11   docusate sodium (COLACE) 100 MG capsule Take 100 mg by mouth 2 (two) times daily.     gabapentin (NEURONTIN) 300 MG capsule Take 1 capsule (300 mg total) by mouth at bedtime. 30 capsule 3   losartan (COZAAR) 25 MG tablet 25 mg daily.      Multiple Vitamins-Minerals (PRESERVISION AREDS) TABS Take 1 tablet by mouth 2 (two) times daily.     Omega-3 Fatty Acids (FISH OIL) 1000 MG CAPS Take 1,000 mg by mouth 2 (two) times daily.      OXYGEN Inhale 4 L into the lungs as needed.     verapamil (VERELAN PM) 360 MG 24 hr capsule Take 360 mg by mouth daily.     Vitamin E 180 MG CAPS Take 180 mg by mouth daily.     furosemide  (LASIX) 20 MG tablet Take 1 tablet (20 mg total) by mouth daily. (Patient not taking: Reported on 01/16/2022) 7 tablet 0   No facility-administered medications prior to visit.    No Known Allergies      Objective:    Physical Exam Constitutional:      General: She is not in acute distress.    Appearance: Normal appearance. She is not ill-appearing.  HENT:  Right Ear: Tympanic membrane normal.     Left Ear: Tympanic membrane normal.     Nose: Nose normal. No congestion or rhinorrhea.     Right Turbinates: Not enlarged or swollen.     Left Turbinates: Not enlarged or swollen.     Right Sinus: No maxillary sinus tenderness or frontal sinus tenderness.     Left Sinus: No maxillary sinus tenderness or frontal sinus tenderness.     Mouth/Throat:     Mouth: Mucous membranes are moist.     Pharynx: No pharyngeal swelling, oropharyngeal exudate or posterior oropharyngeal erythema.     Tonsils: No tonsillar exudate.  Eyes:     Extraocular Movements: Extraocular movements intact.     Conjunctiva/sclera: Conjunctivae normal.     Pupils: Pupils are equal, round, and reactive to light.  Neck:     Thyroid: No thyroid mass.  Cardiovascular:     Rate and Rhythm: Normal rate and regular rhythm.  Pulmonary:     Effort: Pulmonary effort is normal.     Breath sounds: Normal breath sounds. Decreased air movement and transmitted upper airway sounds present.  Lymphadenopathy:     Cervical:     Right cervical: No superficial cervical adenopathy.    Left cervical: No superficial cervical adenopathy.  Neurological:     Mental Status: She is alert.     BP 124/74   Pulse 74   Temp 97.7 F (36.5 C) (Oral)   Ht '5\' 1"'$  (1.549 m)   Wt 95 lb (43.1 kg) Comment: per pt  SpO2 92%   BMI 17.95 kg/m  Wt Readings from Last 3 Encounters:  01/23/22 95 lb (43.1 kg)  11/14/21 96 lb (43.5 kg)  11/07/21 95 lb (43.1 kg)     There are no preventive care reminders to display for this patient.  There  are no preventive care reminders to display for this patient.  Lab Results  Component Value Date   TSH 3.109 10/23/2017   Lab Results  Component Value Date   WBC 5.6 11/06/2021   HGB 13.0 11/06/2021   HCT 39.0 11/06/2021   MCV 93.9 11/06/2021   PLT 220.0 11/06/2021   Lab Results  Component Value Date   NA 142 11/06/2021   K 4.9 11/06/2021   CO2 32 11/06/2021   GLUCOSE 108 (H) 11/06/2021   BUN 23 11/20/2021   CREATININE 0.93 11/20/2021   BILITOT 0.5 11/06/2021   ALKPHOS 66 11/06/2021   AST 27 11/06/2021   ALT 33 11/06/2021   PROT 6.6 11/06/2021   ALBUMIN 4.8 11/06/2021   CALCIUM 9.8 11/06/2021   ANIONGAP 12 10/22/2017   GFR 64.28 11/06/2021   Lab Results  Component Value Date   HGBA1C 5.9 11/06/2021      Assessment & Plan:   Problem List Items Addressed This Visit       Respiratory   Bronchitis    RX doxycycline 100 mg po bid x 10 days Take antibiotic as prescribed. Increase oral fluids. Pt to f/u if sx worsen and or fail to improve in 2-3 days. Ordering cxr to r/o pneumonia        Other   Wheezing    Ordering cxr today, pending results to r/o pneumonia       Relevant Medications   doxycycline (VIBRA-TABS) 100 MG tablet   Acute cough - Primary    Ordering cxr to r/o pneumonia pending results      Relevant Medications   doxycycline (VIBRA-TABS) 100 MG tablet  Other Relevant Orders   DG Chest 2 View    Meds ordered this encounter  Medications   doxycycline (VIBRA-TABS) 100 MG tablet    Sig: Take 1 tablet (100 mg total) by mouth 2 (two) times daily for 10 days.    Dispense:  20 tablet    Refill:  0    Order Specific Question:   Supervising Provider    Answer:   [name], AMY E [[id number]]    Follow-up: No follow-ups on file.    Eugenia Pancoast, FNP

## 2022-01-23 NOTE — Assessment & Plan Note (Signed)
RX doxycycline 100 mg po bid x 10 days Take antibiotic as prescribed. Increase oral fluids. Pt to f/u if sx worsen and or fail to improve in 2-3 days. Ordering cxr to r/o pneumonia

## 2022-01-23 NOTE — Assessment & Plan Note (Signed)
Ordering cxr to r/o pneumonia pending results

## 2022-01-23 NOTE — Assessment & Plan Note (Signed)
Ordering cxr today, pending results to r/o pneumonia

## 2022-01-28 NOTE — Progress Notes (Signed)
No pneumonia but bronchitis noted.  Continue doxycycline follow up with pcp if no improvement in symptoms.   Also f/u with pcp as aortic atherosclerosis seen which is plaque buildup , work on low cholesterol diet.

## 2022-01-31 ENCOUNTER — Encounter: Payer: Self-pay | Admitting: Family Medicine

## 2022-01-31 DIAGNOSIS — J40 Bronchitis, not specified as acute or chronic: Secondary | ICD-10-CM

## 2022-02-01 MED ORDER — AZITHROMYCIN 250 MG PO TABS: ORAL_TABLET | ORAL | 0 refills | Status: AC

## 2022-02-20 ENCOUNTER — Encounter (INDEPENDENT_AMBULATORY_CARE_PROVIDER_SITE_OTHER): Payer: Self-pay

## 2022-02-20 ENCOUNTER — Other Ambulatory Visit (INDEPENDENT_AMBULATORY_CARE_PROVIDER_SITE_OTHER): Payer: Self-pay | Admitting: Nurse Practitioner

## 2022-02-20 MED ORDER — FUROSEMIDE 20 MG PO TABS: 20.0000 mg | ORAL_TABLET | Freq: Every day | ORAL | 0 refills | Status: DC

## 2022-02-22 ENCOUNTER — Encounter: Payer: Medicare HMO | Admitting: Dermatology

## 2022-03-02 DIAGNOSIS — R69 Illness, unspecified: Secondary | ICD-10-CM | POA: Diagnosis not present

## 2022-03-05 ENCOUNTER — Other Ambulatory Visit: Payer: Self-pay

## 2022-03-13 ENCOUNTER — Other Ambulatory Visit: Payer: Self-pay

## 2022-03-22 ENCOUNTER — Ambulatory Visit (INDEPENDENT_AMBULATORY_CARE_PROVIDER_SITE_OTHER): Payer: Medicare HMO | Admitting: Nurse Practitioner

## 2022-03-22 ENCOUNTER — Encounter (INDEPENDENT_AMBULATORY_CARE_PROVIDER_SITE_OTHER): Payer: Medicare HMO

## 2022-04-12 ENCOUNTER — Other Ambulatory Visit (INDEPENDENT_AMBULATORY_CARE_PROVIDER_SITE_OTHER): Payer: Self-pay | Admitting: Vascular Surgery

## 2022-04-12 DIAGNOSIS — I739 Peripheral vascular disease, unspecified: Secondary | ICD-10-CM

## 2022-04-17 ENCOUNTER — Ambulatory Visit (INDEPENDENT_AMBULATORY_CARE_PROVIDER_SITE_OTHER): Payer: Medicare HMO | Admitting: Vascular Surgery

## 2022-04-17 ENCOUNTER — Telehealth: Payer: Self-pay | Admitting: *Deleted

## 2022-04-17 ENCOUNTER — Ambulatory Visit (INDEPENDENT_AMBULATORY_CARE_PROVIDER_SITE_OTHER): Payer: Medicare HMO

## 2022-04-17 ENCOUNTER — Encounter (INDEPENDENT_AMBULATORY_CARE_PROVIDER_SITE_OTHER): Payer: Self-pay | Admitting: Vascular Surgery

## 2022-04-17 VITALS — BP 163/71 | HR 71 | Resp 16

## 2022-04-17 DIAGNOSIS — R7303 Prediabetes: Secondary | ICD-10-CM

## 2022-04-17 DIAGNOSIS — E7800 Pure hypercholesterolemia, unspecified: Secondary | ICD-10-CM | POA: Diagnosis not present

## 2022-04-17 DIAGNOSIS — I739 Peripheral vascular disease, unspecified: Secondary | ICD-10-CM

## 2022-04-17 DIAGNOSIS — I10 Essential (primary) hypertension: Secondary | ICD-10-CM

## 2022-04-17 NOTE — Assessment & Plan Note (Signed)
Her ABIs today are normal at 1.13 on the right and 1.04 on the left with triphasic waveforms on the right and biphasic waveforms on the left.  Doing well from a vascular standpoint.  Does still have significant neuropathy and other limiting issues.  Perfusion is intact.  Continue current medical regimen.  Recheck in 6 months.

## 2022-04-17 NOTE — Telephone Encounter (Signed)
ATC x1 regarding sleep study.  LVM to return call.  When she calls back, we need to know if she has had her sleep study or scheduled her sleep study.  She has a f/u with Dr. Mortimer Fries tomorrow.

## 2022-04-17 NOTE — Progress Notes (Signed)
MRN : [id number]  Misty Ferguson is a 88 y.o. (08/08/1934) female who presents with chief complaint of  Chief Complaint  Patient presents with   Follow-up    Ultrasound follow up  .  History of Present Illness: Patient returns today in follow up of her PAD.  She continues to be bothered by neuropathy and other medical issues but her rest pain is markedly improved after revascularization about 5 months ago.  She had extensive right lower extremity revascularization with iliac and SFA interventions.  Her ABIs today are normal at 1.13 on the right and 1.04 on the left with triphasic waveforms on the right and biphasic waveforms on the left.  Current Outpatient Medications  Medication Sig Dispense Refill   ascorbic acid (VITAMIN C) 1000 MG tablet Take 1,000 mg by mouth daily.      aspirin EC 81 MG tablet Take 1 tablet (81 mg total) by mouth daily. 150 tablet 2   atorvastatin (LIPITOR) 10 MG tablet Take 1 tablet (10 mg total) by mouth daily. 30 tablet 11   budesonide-formoterol (SYMBICORT) 160-4.5 MCG/ACT inhaler Inhale 2 puffs into the lungs in the morning and at bedtime. 3 each 0   Cholecalciferol (VITAMIN D) 125 MCG (5000 UT) CAPS Take 5,000 Units by mouth daily.     clopidogrel (PLAVIX) 75 MG tablet Take 1 tablet (75 mg total) by mouth daily. 30 tablet 11   docusate sodium (COLACE) 100 MG capsule Take 100 mg by mouth 2 (two) times daily.     losartan (COZAAR) 25 MG tablet 25 mg daily.      Multiple Vitamins-Minerals (PRESERVISION AREDS) TABS Take 1 tablet by mouth 2 (two) times daily.     Omega-3 Fatty Acids (FISH OIL) 1000 MG CAPS Take 1,000 mg by mouth 2 (two) times daily.      OXYGEN Inhale 4 L into the lungs as needed.     verapamil (VERELAN PM) 360 MG 24 hr capsule Take 360 mg by mouth daily.     Vitamin E 180 MG CAPS Take 180 mg by mouth daily.     furosemide (LASIX) 20 MG tablet Take 1 tablet (20 mg total) by mouth daily. (Patient not taking: Reported on 04/17/2022) 10 tablet 0    gabapentin (NEURONTIN) 300 MG capsule Take 1 capsule (300 mg total) by mouth at bedtime. (Patient not taking: Reported on 04/17/2022) 30 capsule 3   No current facility-administered medications for this visit.    Past Medical History:  Diagnosis Date   AK (actinic keratosis) 12/08/2020   right pretibia inferior bx proven, LN2 01/10/21   Burping    Chronic airway obstruction, not elsewhere classified    Dyspnea    Dysrhythmia    Macular degeneration (senile) of retina, unspecified    Malignant neoplasm of urinary bladder ([place]) 03/2019   Partial bladder resection and Rad tx's.    Obstructive chronic bronchitis with exacerbation (HCC)    Osteoporosis, unspecified    Oxygen deficiency    Personal history of peptic ulcer disease    SCC (squamous cell carcinoma) 12/08/2020   right pretibia superior, EDC 01/10/21   Tobacco use disorder    Unspecified essential hypertension    Unspecified glaucoma(365.9)     Past Surgical History:  Procedure Laterality Date   CATARACT EXTRACTION W/ INTRAOCULAR LENS  IMPLANT, BILATERAL     CYSTOSCOPY WITH STENT PLACEMENT Left 05/22/2019   Procedure: CYSTOSCOPY WITH STENT PLACEMENT;  Surgeon: Billey Co, MD;  Location: [facility];  Service: Urology;  Laterality: Left;   ELBOW FRACTURE SURGERY Left    LOWER EXTREMITY ANGIOGRAPHY Right 11/20/2021   Procedure: Lower Extremity Angiography;  Surgeon: Algernon Huxley, MD;  Location: [place] [facility];  Service: Cardiovascular;  Laterality: Right;   TRANSURETHRAL RESECTION OF BLADDER TUMOR N/A 05/22/2019   Procedure: TRANSURETHRAL RESECTION OF BLADDER TUMOR (TURBT);  Surgeon: Billey Co, MD;  Location: [facility];  Service: Urology;  Laterality: N/A;     Social History   Tobacco Use   Smoking status: Former    Packs/day: 1.30    Years: 45.00    Total pack years: 58.50    Types: Cigarettes    Quit date: 08/14/2003    Years since quitting: 18.6   Smokeless tobacco: Never  Vaping Use   Vaping Use:  Never used  Substance Use Topics   Alcohol use: Yes    Alcohol/week: 14.0 standard drinks of alcohol    Types: 14 Glasses of wine per week    Comment: 1-2 wine daily   Drug use: No      Family History  Adopted: Yes     No Known Allergies  REVIEW OF SYSTEMS (Negative unless checked)   Constitutional: '[]'$ Weight loss  '[]'$ Fever  '[]'$ Chills Cardiac: '[]'$ Chest pain   '[]'$ Chest pressure   '[]'$ Palpitations   '[]'$ Shortness of breath when laying flat   '[]'$ Shortness of breath at rest   '[]'$ Shortness of breath with exertion. Vascular:  '[]'$ Pain in legs with walking   '[]'$ Pain in legs at rest   '[]'$ Pain in legs when laying flat   '[]'$ Claudication   '[]'$ Pain in feet when walking  '[]'$ Pain in feet at rest  '[]'$ Pain in feet when laying flat   '[]'$ History of DVT   '[]'$ Phlebitis   '[x]'$ Swelling in legs   '[]'$ Varicose veins   '[]'$ Non-healing ulcers Pulmonary:   '[]'$ Uses home oxygen   '[]'$ Productive cough   '[]'$ Hemoptysis   '[]'$ Wheeze  '[]'$ COPD   '[]'$ Asthma Neurologic:  '[]'$ Dizziness  '[]'$ Blackouts   '[]'$ Seizures   '[]'$ History of stroke   '[]'$ History of TIA  '[]'$ Aphasia   '[]'$ Temporary blindness   '[]'$ Dysphagia   '[]'$ Weakness or numbness in arms   '[]'$ Weakness or numbness in legs Musculoskeletal:  '[x]'$ Arthritis   '[]'$ Joint swelling   '[x]'$ Joint pain   '[]'$ Low back pain Hematologic:  '[x]'$ Easy bruising  '[]'$ Easy bleeding   '[]'$ Hypercoagulable state   '[]'$ Anemic   Gastrointestinal:  '[]'$ Blood in stool   '[]'$ Vomiting blood  '[]'$ Gastroesophageal reflux/heartburn   '[]'$ Abdominal pain Genitourinary:  '[]'$ Chronic kidney disease   '[]'$ Difficult urination  '[]'$ Frequent urination  '[]'$ Burning with urination   '[]'$ Hematuria Skin:  '[]'$ Rashes   '[]'$ Ulcers   '[]'$ Wounds Psychological:  '[]'$ History of anxiety   '[]'$  History of major depression.  Physical Examination  BP (!) 163/71 (BP Location: Right Arm)   Pulse 71   Resp 16  Gen:  WD/WN, NAD Head: /AT, No temporalis wasting. Ear/Nose/Throat: Hearing grossly intact, nares w/o erythema or drainage Eyes: Conjunctiva clear. Sclera non-icteric Neck: Supple.  Trachea  midline Pulmonary:  Good air movement, no use of accessory muscles.  Cardiac: Somewhat irregular Vascular:  Vessel Right Left  Radial Palpable Palpable                          PT Palpable Palpable  DP Palpable Palpable   Gastrointestinal: soft, non-tender/non-distended. No guarding/reflex.  Musculoskeletal: M/S 5/5 throughout.  No deformity or atrophy.  In a wheelchair.  Extensive stasis dermatitis changes and superficial bruising in both lower extremities.  No significant lower extremity edema. Neurologic: Sensation grossly intact in extremities.  Symmetrical.  Speech is fluent.  Psychiatric: Judgment intact, Mood & affect appropriate for pt's clinical situation. Dermatologic: No rashes or ulcers noted.  No cellulitis or open wounds.      Labs No results found for this or any previous visit (from the past 2160 hour(s)).  Radiology No results found.  Assessment/Plan Essential hypertension, benign blood pressure control important in reducing the progression of atherosclerotic disease. On appropriate oral medications.     HYPERCHOLESTEROLEMIA lipid control important in reducing the progression of atherosclerotic disease. Continue statin therapy     Prediabetes blood glucose control important in reducing the progression of atherosclerotic disease. Also, involved in wound healing. On appropriate medications.  PVD (peripheral vascular disease) ([place]) Her ABIs today are normal at 1.13 on the right and 1.04 on the left with triphasic waveforms on the right and biphasic waveforms on the left.  Doing well from a vascular standpoint.  Does still have significant neuropathy and other limiting issues.  Perfusion is intact.  Continue current medical regimen.  Recheck in 6 months.    Leotis Pain, MD  04/17/2022 9:43 AM    This note was created with Dragon medical transcription system.  Any errors from dictation are purely unintentional

## 2022-04-18 ENCOUNTER — Encounter: Payer: Self-pay | Admitting: Internal Medicine

## 2022-04-18 ENCOUNTER — Ambulatory Visit (INDEPENDENT_AMBULATORY_CARE_PROVIDER_SITE_OTHER): Payer: Medicare HMO | Admitting: Internal Medicine

## 2022-04-18 VITALS — BP 126/62 | HR 75 | Temp 97.7°F | Ht 61.0 in | Wt 93.0 lb

## 2022-04-18 DIAGNOSIS — R0689 Other abnormalities of breathing: Secondary | ICD-10-CM | POA: Diagnosis not present

## 2022-04-18 DIAGNOSIS — J9611 Chronic respiratory failure with hypoxia: Secondary | ICD-10-CM

## 2022-04-18 DIAGNOSIS — J449 Chronic obstructive pulmonary disease, unspecified: Secondary | ICD-10-CM | POA: Diagnosis not present

## 2022-04-18 NOTE — Patient Instructions (Addendum)
Continue Symbicort Continue oxygen therapy Avoid sick contacts Avoid second hand smoke

## 2022-04-18 NOTE — Progress Notes (Signed)
[facility] Consultation     Date: 04/18/2022,   MRN# [id number] Misty Ferguson [birth year]    AdmissionWeight: 93 lb (42.2 kg)                 CurrentWeight: 93 lb (42.2 kg)  Synopsis: Misty Ferguson was first seen by the [facility] pulmonary clinic in the summer of 2013. She has COPD. Simple spirometry performed in clinic showed an FEV1 of 45% predicted with clear obstruction.  She smoked 2 pack/day x 40 years, quit  17 years ago according to patient. She is a frequent exacerbation phenotype. She uses 2 L of oxygen continuously with exertion and at night   lling, mild erythema CC  Follow-up COPD Follow-up chronic hypoxic respiratory failure   HPI Chronic shortness of breath and dyspnea exertion seems to be stable over the last several years  Patient on oxygen therapy 24/7 Patient needs this for survival  Diagnosed with COVID July 2022 Declining ever since then    No exacerbation at this time No evidence of heart failure at this time No evidence or signs of infection at this time No respiratory distress No fevers, chills, nausea, vomiting, diarrhea No evidence of lower extremity edema No evidence hemoptysis  Patient continues to use oxygen  Patient diagnosed with bladder cancer sees urology      o signs of CHF or lower ext swelling at t ssues, has chCurrent Medication:   Current Outpatient Medications:    ascorbic acid (VITAMIN C) 1000 MG tablet, Take 1,000 mg by mouth daily. , Disp: , Rfl:    aspirin EC 81 MG tablet, Take 1 tablet (81 mg total) by mouth daily., Disp: 150 tablet, Rfl: 2   atorvastatin (LIPITOR) 10 MG tablet, Take 1 tablet (10 mg total) by mouth daily., Disp: 30 tablet, Rfl: 11   budesonide-formoterol (SYMBICORT) 160-4.5 MCG/ACT inhaler, Inhale 2 puffs into the lungs in the morning and at bedtime., Disp: 3 each, Rfl: 0   Cholecalciferol (VITAMIN D) 125 MCG (5000 UT) CAPS, Take 5,000 Units by mouth daily., Disp: , Rfl:     clopidogrel (PLAVIX) 75 MG tablet, Take 1 tablet (75 mg total) by mouth daily., Disp: 30 tablet, Rfl: 11   docusate sodium (COLACE) 100 MG capsule, Take 100 mg by mouth 2 (two) times daily., Disp: , Rfl:    losartan (COZAAR) 25 MG tablet, 25 mg daily. , Disp: , Rfl:    Multiple Vitamins-Minerals (PRESERVISION AREDS) TABS, Take 1 tablet by mouth 2 (two) times daily., Disp: , Rfl:    Omega-3 Fatty Acids (FISH OIL) 1000 MG CAPS, Take 1,000 mg by mouth 2 (two) times daily. , Disp: , Rfl:    OXYGEN, Inhale 4 L into the lungs as needed., Disp: , Rfl:    verapamil (VERELAN PM) 360 MG 24 hr capsule, Take 360 mg by mouth daily., Disp: , Rfl:    Vitamin E 180 MG CAPS, Take 180 mg by mouth daily., Disp: , Rfl:      ALLERGIES   Patient has no known allergies.      Review of Systems: Gen:  Denies  fever, sweats, chills weight loss  HEENT: Denies blurred vision, double vision, ear pain, eye pain, hearing loss, nose bleeds, sore throat Cardiac:  No dizziness, chest pain or heaviness, chest tightness,edema, No JVD Resp:   No cough, -sputum production, +shortness of breath,-wheezing, -hemoptysis,  Other:  All other systems negative  BP 126/62 (BP Location: Left Arm, Cuff Size: Normal)  Pulse 75   Temp 97.7 F (36.5 C) (Temporal)   Ht '5\' 1"'$  (1.549 m)   Wt 93 lb (42.2 kg)   SpO2 90%   BMI 17.57 kg/m    Physical Examination:   General Appearance: No distress  EYES PERRLA, EOM intact.   NECK Supple, No JVD Pulmonary: normal breath sounds, No wheezing.  CardiovascularNormal S1,S2.  No m/r/g.   Abdomen: Benign, Soft, non-tender. ALL OTHER ROS ARE NEGATIVE         ASSESSMENT/PLAN   88 year old white female seen today for follow-up assessment for Gold stage D end-stage severe COPD with chronic hypoxic respiratory failure with CT findings suggestive of severe emphysema and bronchiectasis with right lower lobe pleural-based nodule, patient with excessive daytime sleepiness findings may  suggest underlying sleep apnea-patient does not want to go any further sleep apnea testing at this time   Severe end-stage COPD Patient was initially started on nebulized therapy however switch back to Symbicort Flutter valve as tolerated  Patient is a very high risk for pulmonary complications if she were to undergo any type of procedure especially bronchoscopy   End-stage COPD Patient states she can take her Symbicort as prescribed however we will revisit this in the next 6 months to assess proper usage and efficacy of traditional inhaler therapy  Chronic hypoxic respiratory failure due to COPD Continue using oxygen as prescribed she needs this for survival   Right pleural-based nodule based on CT scan findings Patient does not want repeat CT chest to assess interval changes She does not want to pursue any further diagnostic testing She is happy with her life and does not want any type of work-up for this nodule We addressed repeating CT scans at this time however patient does not want any  further intervention or work-up   MEDICATION ADJUSTMENTS/LABS AND TESTS ORDERED: Continue Symbicort Continue oxygen therapy  Follow-up in 6 months  Total time spent 22 minutes    Maretta Bees Patricia Pesa, M.D.  [facility]  Medical Director [place] Director [facility] Cardio-Pulmonary Department

## 2022-04-18 NOTE — Telephone Encounter (Signed)
Patient had office visit with Kasa today. And this was addressed at office visit. Nothing further needed

## 2022-05-02 ENCOUNTER — Ambulatory Visit: Payer: Medicare HMO

## 2022-05-02 ENCOUNTER — Ambulatory Visit
Admission: RE | Admit: 2022-05-02 | Discharge: 2022-05-02 | Disposition: A | Discharge status: Home / Self Care | Payer: Medicare HMO | Source: Ambulatory Visit | Attending: Urology | Admitting: Urology

## 2022-05-02 DIAGNOSIS — Z8551 Personal history of malignant neoplasm of bladder: Secondary | ICD-10-CM | POA: Diagnosis not present

## 2022-05-08 ENCOUNTER — Ambulatory Visit (INDEPENDENT_AMBULATORY_CARE_PROVIDER_SITE_OTHER): Payer: Medicare HMO

## 2022-05-08 DIAGNOSIS — Z23 Encounter for immunization: Secondary | ICD-10-CM | POA: Diagnosis not present

## 2022-05-09 ENCOUNTER — Encounter: Payer: Self-pay | Admitting: Urology

## 2022-05-09 ENCOUNTER — Ambulatory Visit: Payer: Medicare HMO | Admitting: Urology

## 2022-05-09 VITALS — BP 174/81 | HR 83 | Ht 61.0 in | Wt 94.0 lb

## 2022-05-09 DIAGNOSIS — Z8551 Personal history of malignant neoplasm of bladder: Secondary | ICD-10-CM

## 2022-05-09 DIAGNOSIS — C672 Malignant neoplasm of lateral wall of bladder: Secondary | ICD-10-CM

## 2022-05-09 NOTE — Progress Notes (Signed)
   05/09/2022 1:53 PM   Misty Ferguson [birth year] [id number]  Reason for visit: Follow up muscle invasive bladder cancer, history of hydronephrosis  HPI: 88 year old female with COPD and oxygen who originally presented in September 2020 with gross hematuria and CTU showed 3cm left sided bladder mass with left hydronephrosis, sCr 1.46(eGFR33).  She underwent TURBT 05/22/2019 with resection of all visible disease(HG T2 urothelial cell carcinoma with squamous differentiation), left ureteral stent placement. Renal function improved after stent to 0.98(eGFR 54), stent removed in follow-up.  She deferred concurrent chemotherapy, but did undergo radiation to the bladder and the pelvic lymph nodes with Dr. Baruch Gouty completed in December 2020.  She had some vascular work done this year and remains on Plavix.  I reviewed those notes from Dr. Lucky Cowboy.  We have had multiple conversations about surveillance, and with her age and comorbidities she strongly prefers less aggressive surveillance and understands the risk of recurrence.  She has opted for yearly renal/bladder ultrasounds.  I personally viewed and interpreted the ultrasound dated 05/02/2022 which shows no hydronephrosis, no recurrence of disease.  Most recent renal function from April 2023 remains normal with creatinine 0.93, EGFR greater than 60.  Overall, she has done very well considering her muscle invasive disease with squamous differentiation managed with maximal resection and radiation with no evidence of recurrence thus far.  We discussed return precautions at length including gross hematuria or flank pain.  Using shared decision making she would like to continue yearly renal/bladder ultrasound.  RTC 1 year with renal/bladder ultrasound prior  Billey Co, MD  [facility] [address] [place], [place] [address] [phone]

## 2022-05-17 ENCOUNTER — Telehealth (INDEPENDENT_AMBULATORY_CARE_PROVIDER_SITE_OTHER): Payer: Self-pay

## 2022-05-17 ENCOUNTER — Other Ambulatory Visit (INDEPENDENT_AMBULATORY_CARE_PROVIDER_SITE_OTHER): Payer: Self-pay | Admitting: Nurse Practitioner

## 2022-05-17 NOTE — Telephone Encounter (Signed)
Pharmacy Tech Margo called from [facility] pharmacy stating that they need a rx for Gabapentin for this pt.  Please advise

## 2022-05-18 ENCOUNTER — Other Ambulatory Visit (INDEPENDENT_AMBULATORY_CARE_PROVIDER_SITE_OTHER): Payer: Self-pay | Admitting: Nurse Practitioner

## 2022-05-18 MED ORDER — GABAPENTIN 300 MG PO CAPS: 300.0000 mg | ORAL_CAPSULE | Freq: Every day | ORAL | 3 refills | Status: DC

## 2022-05-18 NOTE — Telephone Encounter (Signed)
[facility] sent Rx

## 2022-05-26 ENCOUNTER — Encounter (INDEPENDENT_AMBULATORY_CARE_PROVIDER_SITE_OTHER): Payer: Self-pay

## 2022-05-29 ENCOUNTER — Ambulatory Visit (INDEPENDENT_AMBULATORY_CARE_PROVIDER_SITE_OTHER): Payer: Medicare HMO | Admitting: Family Medicine

## 2022-05-29 ENCOUNTER — Encounter: Payer: Self-pay | Admitting: Family Medicine

## 2022-05-29 VITALS — BP 130/70 | HR 73 | Temp 97.8°F | Ht 61.0 in | Wt 94.0 lb

## 2022-05-29 DIAGNOSIS — L139 Bullous disorder, unspecified: Secondary | ICD-10-CM | POA: Diagnosis not present

## 2022-05-29 DIAGNOSIS — I739 Peripheral vascular disease, unspecified: Secondary | ICD-10-CM

## 2022-05-29 LAB — CBC WITH DIFFERENTIAL/PLATELET
Basophils Absolute: 0 10*3/uL (ref 0.0–0.1)
Basophils Relative: 0.5 % (ref 0.0–3.0)
Eosinophils Absolute: 0.1 10*3/uL (ref 0.0–0.7)
Eosinophils Relative: 2 % (ref 0.0–5.0)
HCT: 38.4 % (ref 36.0–46.0)
Hemoglobin: 12.9 g/dL (ref 12.0–15.0)
Lymphocytes Relative: 13.3 % (ref 12.0–46.0)
Lymphs Abs: 0.7 10*3/uL (ref 0.7–4.0)
MCHC: 33.5 g/dL (ref 30.0–36.0)
MCV: 93.9 fl (ref 78.0–100.0)
Monocytes Absolute: 0.4 10*3/uL (ref 0.1–1.0)
Monocytes Relative: 8.5 % (ref 3.0–12.0)
Neutro Abs: 3.9 10*3/uL (ref 1.4–7.7)
Neutrophils Relative %: 75.7 % (ref 43.0–77.0)
Platelets: 217 10*3/uL (ref 150.0–400.0)
RBC: 4.09 Mil/uL (ref 3.87–5.11)
RDW: 13.3 % (ref 11.5–15.5)
WBC: 5.1 10*3/uL (ref 4.0–10.5)

## 2022-05-29 LAB — BRAIN NATRIURETIC PEPTIDE: Pro B Natriuretic peptide (BNP): 57 pg/mL (ref 0.0–100.0)

## 2022-05-29 NOTE — Assessment & Plan Note (Addendum)
She has chronic changes in her lower extremities secondary to venous insufficiency and peripheral vascular disease.  She has intermittent issues with serous bulla appearance on bilateral legs but at the moment she only has 1 on left anterior leg.  There is no visible injury or new rash suggesting infection or abscess. We will check a CBC.  She is not significantly fluid overloaded on exam but I will check a BNP to make sure we do not need to evaluate her heart.  Of note in the past she says the bulla have improved if they are more severe than they are now with diuretic.  She will let me know if it is progressing if she would like to try a repeat short-term current course of hydrochlorothiazide.  In the meantime I encouraged her to elevate her legs above her heart as able, exercise legs as able and wrap areas carefully with Ace bandage.

## 2022-05-29 NOTE — Progress Notes (Signed)
Patient ID: Misty Ferguson, female    DOB: [birth year], 88 y.o.   MRN: [id number]  This visit was conducted in person.  BP (!) 150/72   Pulse 73   Temp 97.8 F (36.6 C) (Oral)   Ht '5\' 1"'$  (1.549 m)   Wt 94 lb (42.6 kg)   SpO2 94% Comment: 2 L O2  BMI 17.76 kg/m    CC:  Chief Complaint  Patient presents with   Leg Drainage    Right-? Back of Upper Calf    Subjective:   HPI: TAHJA Ferguson is a 88 y.o. female presenting on 05/29/2022 for Leg Drainage (Right-? Back of Upper Calf)   She has history of vascular    She has bubbles appear at times on legs..  Last Friday she injured skin with bandage clip on back on right upper calf... noted clear discharge. No change in pain.  No rash at site.. has chronic venous stasis changes.  No clear scab.   No swelling at end of the day per pt. BP at home 130/70.  BP Readings from Last 3 Encounters:  05/29/22 (!) 150/72  05/09/22 (!) 174/81  04/18/22 126/62   Wt Readings from Last 3 Encounters:  05/29/22 94 lb (42.6 kg)  05/09/22 94 lb (42.6 kg)  04/18/22 93 lb (42.2 kg)       Has appt with cardiology in 08/2022     Relevant past medical, surgical, family and social history reviewed and updated as indicated. Interim medical history since our last visit reviewed. Allergies and medications reviewed and updated. Outpatient Medications Prior to Visit  Medication Sig Dispense Refill   ascorbic acid (VITAMIN C) 1000 MG tablet Take 1,000 mg by mouth daily.      aspirin EC 81 MG tablet Take 1 tablet (81 mg total) by mouth daily. 150 tablet 2   atorvastatin (LIPITOR) 10 MG tablet Take 1 tablet (10 mg total) by mouth daily. 30 tablet 11   budesonide-formoterol (SYMBICORT) 160-4.5 MCG/ACT inhaler Inhale 2 puffs into the lungs in the morning and at bedtime. 3 each 0   Cholecalciferol (VITAMIN D) 125 MCG (5000 UT) CAPS Take 5,000 Units by mouth daily.     clopidogrel (PLAVIX) 75 MG tablet Take 1 tablet (75 mg total) by mouth daily.  30 tablet 11   docusate sodium (COLACE) 100 MG capsule Take 100 mg by mouth 2 (two) times daily.     gabapentin (NEURONTIN) 300 MG capsule Take 1 capsule (300 mg total) by mouth at bedtime. 30 capsule 3   losartan (COZAAR) 25 MG tablet 25 mg daily.      Multiple Vitamins-Minerals (PRESERVISION AREDS) TABS Take 1 tablet by mouth 2 (two) times daily.     Omega-3 Fatty Acids (FISH OIL) 1000 MG CAPS Take 1,000 mg by mouth 2 (two) times daily.      OXYGEN Inhale 4 L into the lungs as needed.     verapamil (VERELAN PM) 360 MG 24 hr capsule Take 360 mg by mouth daily.     Vitamin E 180 MG CAPS Take 180 mg by mouth daily.     No facility-administered medications prior to visit.     Per HPI unless specifically indicated in ROS section below Review of Systems  Constitutional:  Negative for chills, fatigue and fever.  HENT:  Negative for congestion and ear pain.   Eyes:  Negative for pain and redness.  Respiratory:  Negative for cough and shortness of breath.  Cardiovascular:  Negative for chest pain, palpitations and leg swelling.  Gastrointestinal:  Negative for abdominal pain, blood in stool, constipation, diarrhea, nausea and vomiting.  Genitourinary:  Negative for dysuria and vaginal bleeding.  Musculoskeletal:  Negative for back pain and myalgias.  Skin:  Negative for rash.  Neurological:  Negative for dizziness, syncope, light-headedness and headaches.  Psychiatric/Behavioral:  Negative for dysphoric mood. The patient is not nervous/anxious.    Objective:  BP (!) 150/72   Pulse 73   Temp 97.8 F (36.6 C) (Oral)   Ht '5\' 1"'$  (1.549 m)   Wt 94 lb (42.6 kg)   SpO2 94% Comment: 2 L O2  BMI 17.76 kg/m   Wt Readings from Last 3 Encounters:  05/29/22 94 lb (42.6 kg)  05/09/22 94 lb (42.6 kg)  04/18/22 93 lb (42.2 kg)      Physical Exam Constitutional:      General: She is not in acute distress.    Appearance: Normal appearance. She is well-developed. She is not ill-appearing or  toxic-appearing.     Comments: In wheelchair on oxygen  HENT:     Head: Normocephalic.     Right Ear: Hearing, tympanic membrane, ear canal and external ear normal. Tympanic membrane is not erythematous, retracted or bulging.     Left Ear: Hearing, tympanic membrane, ear canal and external ear normal. Tympanic membrane is not erythematous, retracted or bulging.     Nose: No mucosal edema or rhinorrhea.     Right Sinus: No maxillary sinus tenderness or frontal sinus tenderness.     Left Sinus: No maxillary sinus tenderness or frontal sinus tenderness.     Mouth/Throat:     Pharynx: Uvula midline.  Eyes:     General: Lids are normal. Lids are everted, no foreign bodies appreciated.     Conjunctiva/sclera: Conjunctivae normal.     Pupils: Pupils are equal, round, and reactive to light.  Neck:     Thyroid: No thyroid mass or thyromegaly.     Vascular: No carotid bruit.     Trachea: Trachea normal.  Cardiovascular:     Rate and Rhythm: Normal rate and regular rhythm.     Pulses: Normal pulses.     Heart sounds: Normal heart sounds, S1 normal and S2 normal. No murmur heard.    No friction rub. No gallop.  Pulmonary:     Effort: Pulmonary effort is normal. No tachypnea or respiratory distress.     Breath sounds: Normal breath sounds. Decreased air movement present. No decreased breath sounds, wheezing, rhonchi or rales.     Comments: No rhonchi or suggestion of fluid Abdominal:     General: Bowel sounds are normal.     Palpations: Abdomen is soft.     Tenderness: There is no abdominal tenderness.  Musculoskeletal:     Cervical back: Normal range of motion and neck supple.     Right lower leg: No edema.     Left lower leg: No edema.  Skin:    General: Skin is warm and dry.     Findings: No rash.  Neurological:     Mental Status: She is alert.  Psychiatric:        Mood and Affect: Mood is not anxious or depressed.        Speech: Speech normal.        Behavior: Behavior normal.  Behavior is cooperative.        Thought Content: Thought content normal.  Judgment: Judgment normal.          Results for orders placed or performed during the hospital encounter of 11/20/21  BUN  Result Value Ref Range   BUN 23 8 - 23 mg/dL  Creatinine, serum  Result Value Ref Range   Creatinine, Ser 0.93 0.44 - 1.00 mg/dL   GFR, Estimated >60 >60 mL/min     COVID 19 screen:  No recent travel or known exposure to COVID19 The patient denies respiratory symptoms of COVID 19 at this time. The importance of social distancing was discussed today.   Assessment and Plan    Problem List Items Addressed This Visit     PVD (peripheral vascular disease) ([place])   Serous bulla of skin - Primary    She has chronic changes in her lower extremities secondary to venous insufficiency and peripheral vascular disease.  She has intermittent issues with serous bulla appearance on bilateral legs but at the moment she only has 1 on left anterior leg.  There is no visible injury or new rash suggesting infection or abscess. We will check a CBC.  She is not significantly fluid overloaded on exam but I will check a BNP to make sure we do not need to evaluate her heart.  Of note in the past she says the bulla have improved if they are more severe than they are now with diuretic.  She will let me know if it is progressing if she would like to try a repeat short-term current course of hydrochlorothiazide.  In the meantime I encouraged her to elevate her legs above her heart as able, exercise legs as able and wrap areas carefully with Ace bandage.      Relevant Orders   CBC with Differential/Platelet   Brain natriuretic peptide        Eliezer Lofts, MD

## 2022-06-11 ENCOUNTER — Encounter (INDEPENDENT_AMBULATORY_CARE_PROVIDER_SITE_OTHER): Payer: Self-pay

## 2022-07-03 ENCOUNTER — Ambulatory Visit: Payer: Medicare HMO | Admitting: Dermatology

## 2022-07-03 DIAGNOSIS — D492 Neoplasm of unspecified behavior of bone, soft tissue, and skin: Secondary | ICD-10-CM

## 2022-07-03 DIAGNOSIS — D229 Melanocytic nevi, unspecified: Secondary | ICD-10-CM

## 2022-07-03 DIAGNOSIS — C44729 Squamous cell carcinoma of skin of left lower limb, including hip: Secondary | ICD-10-CM

## 2022-07-03 DIAGNOSIS — Z85828 Personal history of other malignant neoplasm of skin: Secondary | ICD-10-CM

## 2022-07-03 DIAGNOSIS — L570 Actinic keratosis: Secondary | ICD-10-CM | POA: Diagnosis not present

## 2022-07-03 DIAGNOSIS — D0471 Carcinoma in situ of skin of right lower limb, including hip: Secondary | ICD-10-CM

## 2022-07-03 DIAGNOSIS — D099 Carcinoma in situ, unspecified: Secondary | ICD-10-CM

## 2022-07-03 DIAGNOSIS — Z1283 Encounter for screening for malignant neoplasm of skin: Secondary | ICD-10-CM | POA: Diagnosis not present

## 2022-07-03 DIAGNOSIS — L578 Other skin changes due to chronic exposure to nonionizing radiation: Secondary | ICD-10-CM | POA: Diagnosis not present

## 2022-07-03 DIAGNOSIS — L821 Other seborrheic keratosis: Secondary | ICD-10-CM | POA: Diagnosis not present

## 2022-07-03 DIAGNOSIS — L814 Other melanin hyperpigmentation: Secondary | ICD-10-CM

## 2022-07-03 DIAGNOSIS — L98499 Non-pressure chronic ulcer of skin of other sites with unspecified severity: Secondary | ICD-10-CM | POA: Diagnosis not present

## 2022-07-03 HISTORY — DX: Carcinoma in situ, unspecified: D09.9

## 2022-07-03 NOTE — Progress Notes (Signed)
Follow-Up Visit   Subjective  Misty Ferguson is a 88 y.o. female who presents for the following: Annual Exam (Hx SCC, AK).  The patient presents for Total-Body Skin Exam (TBSE) for skin cancer screening and mole check.  The patient has spots, moles and lesions to be evaluated, some may be new or changing and the patient has concerns that these could be cancer.  Patient had angioplasty done on right leg. She is accompanied by husband.  The following portions of the chart were reviewed this encounter and updated as appropriate:   Tobacco  Allergies  Meds  Problems  Med Hx  Surg Hx  Fam Hx      Review of Systems:  No other skin or systemic complaints except as noted in HPI or Assessment and Plan.  Objective  Well appearing patient in no apparent distress; mood and affect are within normal limits.  A full examination was performed including scalp, head, eyes, ears, nose, lips, neck, chest, axillae, abdomen, back, buttocks, bilateral upper extremities, bilateral lower extremities, hands, feet, fingers, toes, fingernails, and toenails. All findings within normal limits unless otherwise noted below.  L Forearm x 1, L distal thigh x 3, R pretibia x 3, R forearm x 1, L dorsal hands x 1, R upper arm x 1 (10) Erythematous thin papules/macules with gritty scale.   right pretibia 1.9 cm scaly pink eroded plaque       left medial calf 0.5 cm ulceration within 1.2 cm scaly pink plaque R/o SCC        Assessment & Plan  AK (actinic keratosis) (10) L Forearm x 1, L distal thigh x 3, R pretibia x 3, R forearm x 1, L dorsal hands x 1, R upper arm x 1  Hypertrophic at left forearm, monitor, consider bx of indicated Hypertrophic at right pretibia, right forearm  Actinic keratoses are precancerous spots that appear secondary to cumulative UV radiation exposure/sun exposure over time. They are chronic with expected duration over 1 year. A portion of actinic keratoses will progress  to squamous cell carcinoma of the skin. It is not possible to reliably predict which spots will progress to skin cancer and so treatment is recommended to prevent development of skin cancer.  Recommend daily broad spectrum sunscreen SPF 30+ to sun-exposed areas, reapply every 2 hours as needed.  Recommend staying in the shade or wearing long sleeves, sun glasses (UVA+UVB protection) and wide brim hats (4-inch brim around the entire circumference of the hat). Call for new or changing lesions.  Prior to procedure, discussed risks of blister formation, small wound, skin dyspigmentation, or rare scar following cryotherapy. Recommend Vaseline ointment to treated areas while healing.   Destruction of lesion - L Forearm x 1, L distal thigh x 3, R pretibia x 3, R forearm x 1, L dorsal hands x 1, R upper arm x 1  Destruction method: cryotherapy   Informed consent: discussed and consent obtained   Lesion destroyed using liquid nitrogen: Yes   Cryotherapy cycles:  2 Outcome: patient tolerated procedure well with no complications   Post-procedure details: wound care instructions given    Neoplasm of skin (2) right pretibia  Skin / nail biopsy Type of biopsy: tangential   Informed consent: discussed and consent obtained   Timeout: patient name, date of birth, surgical site, and procedure verified   Procedure prep:  Patient was prepped and draped in usual sterile fashion Prep type:  Isopropyl alcohol Anesthesia: the lesion was anesthetized in  a standard fashion   Anesthetic:  1% lidocaine w/ epinephrine 1-100,000 buffered w/ 8.4% NaHCO3 Instrument used: DermaBlade   Hemostasis achieved with: aluminum chloride   Outcome: patient tolerated procedure well   Post-procedure details: wound care instructions given   Additional details:  Petrolatum and a pressure bandage applied  Specimen 1 - Surgical pathology Differential Diagnosis: r/o SCC  Check Margins: No 1.9 cm scaly pink eroded  plaque  left medial calf  Skin / nail biopsy Type of biopsy: tangential   Informed consent: discussed and consent obtained   Timeout: patient name, date of birth, surgical site, and procedure verified   Procedure prep:  Patient was prepped and draped in usual sterile fashion Prep type:  Isopropyl alcohol Anesthesia: the lesion was anesthetized in a standard fashion   Anesthetic:  1% lidocaine w/ epinephrine 1-100,000 buffered w/ 8.4% NaHCO3 Instrument used: DermaBlade   Hemostasis achieved with: aluminum chloride   Outcome: patient tolerated procedure well   Post-procedure details: wound care instructions given   Additional details:  Petrolatum and a pressure bandage applied  Specimen 2 - Surgical pathology Differential Diagnosis: R/o SCC  Check Margins: No 0.5 cm ulceration within 1.2 cm scaly pink plaque    History of Squamous Cell Carcinoma of the Skin - No evidence of recurrence today - No lymphadenopathy - Recommend regular full body skin exams - Recommend daily broad spectrum sunscreen SPF 30+ to sun-exposed areas, reapply every 2 hours as needed.  - Call if any new or changing lesions are noted between office visits  Lentigines - Scattered tan macules - Due to sun exposure - Benign-appearing, observe - Recommend daily broad spectrum sunscreen SPF 30+ to sun-exposed areas, reapply every 2 hours as needed. - Call for any changes  Seborrheic Keratoses - Stuck-on, waxy, tan-brown papules and/or plaques  - Benign-appearing - Discussed benign etiology and prognosis. - Observe - Call for any changes  Melanocytic Nevi - Tan-brown and/or pink-flesh-colored symmetric macules and papules - Benign appearing on exam today - Observation - Call clinic for new or changing moles - Recommend daily use of broad spectrum spf 30+ sunscreen to sun-exposed areas.   Hemangiomas - Red papules - Discussed benign nature - Observe - Call for any changes  Actinic Damage -  Chronic condition, secondary to cumulative UV/sun exposure - diffuse scaly erythematous macules with underlying dyspigmentation - Recommend daily broad spectrum sunscreen SPF 30+ to sun-exposed areas, reapply every 2 hours as needed.  - Staying in the shade or wearing long sleeves, sun glasses (UVA+UVB protection) and wide brim hats (4-inch brim around the entire circumference of the hat) are also recommended for sun protection.  - Call for new or changing lesions.  Skin cancer screening performed today.  Return in about 6 months (around 01/01/2023) for TBSE, Hx SCC, Hx AK.  Graciella Belton, RMA, am acting as scribe for Forest Gleason, MD .  Documentation: I have reviewed the above documentation for accuracy and completeness, and I agree with the above.  Forest Gleason, MD

## 2022-07-03 NOTE — Patient Instructions (Signed)
Cryotherapy Aftercare  Wash gently with soap and water everyday.   Apply Vaseline and Band-Aid daily until healed.    Wound Care Instructions  Cleanse wound gently with soap and water once a day then pat dry with clean gauze. Apply a thin coat of Petrolatum (petroleum jelly, "Vaseline") over the wound (unless you have an allergy to this). We recommend that you use a new, sterile tube of Vaseline. Do not pick or remove scabs. Do not remove the yellow or white "healing tissue" from the base of the wound.  Cover the wound with fresh, clean, nonstick gauze and secure with paper tape. You may use Band-Aids in place of gauze and tape if the wound is small enough, but would recommend trimming much of the tape off as there is often too much. Sometimes Band-Aids can irritate the skin.  You should call the office for your biopsy report after 1 week if you have not already been contacted.  If you experience any problems, such as abnormal amounts of bleeding, swelling, significant bruising, significant pain, or evidence of infection, please call the office immediately.  FOR ADULT SURGERY PATIENTS: If you need something for pain relief you may take 1 extra strength Tylenol (acetaminophen) AND 2 Ibuprofen ('200mg'$  each) together every 4 hours as needed for pain. (do not take these if you are allergic to them or if you have a reason you should not take them.) Typically, you may only need pain medication for 1 to 3 days.   Melanoma ABCDEs  Melanoma is the most dangerous type of skin cancer, and is the leading cause of death from skin disease.  You are more likely to develop melanoma if you: Have light-colored skin, light-colored eyes, or red or blond hair Spend a lot of time in the sun Tan regularly, either outdoors or in a tanning bed Have had blistering sunburns, especially during childhood Have a close family member who has had a melanoma Have atypical moles or large birthmarks  Early detection of  melanoma is key since treatment is typically straightforward and cure rates are extremely high if we catch it early.   The first sign of melanoma is often a change in a mole or a new dark spot.  The ABCDE system is a way of remembering the signs of melanoma.  A for asymmetry:  The two halves do not match. B for border:  The edges of the growth are irregular. C for color:  A mixture of colors are present instead of an even brown color. D for diameter:  Melanomas are usually (but not always) greater than 17m - the size of a pencil eraser. E for evolution:  The spot keeps changing in size, shape, and color.  Please check your skin once per month between visits. You can use a small mirror in front and a large mirror behind you to keep an eye on the back side or your body.   If you see any new or changing lesions before your next follow-up, please call to schedule a visit.  Please continue daily skin protection including broad spectrum sunscreen SPF 30+ to sun-exposed areas, reapplying every 2 hours as needed when you're outdoors.    Due to recent changes in healthcare laws, you may see results of your pathology and/or laboratory studies on MyChart before the doctors have had a chance to review them. We understand that in some cases there may be results that are confusing or concerning to you. Please understand that not all  results are received at the same time and often the doctors may need to interpret multiple results in order to provide you with the best plan of care or course of treatment. Therefore, we ask that you please give [place] 2 business days to thoroughly review all your results before contacting the office for clarification. Should we see a critical lab result, you will be contacted sooner.   If You Need Anything After Your Visit  If you have any questions or concerns for your doctor, please call our main line at [phone] and press option 4 to reach your doctor's medical assistant. If  no one answers, please leave a voicemail as directed and we will return your call as soon as possible. Messages left after 4 pm will be answered the following business day.   You may also send [place] a message via [place]. We typically respond to MyChart messages within 1-2 business days.  For prescription refills, please ask your pharmacy to contact our office. Our fax number is [phone].  If you have an urgent issue when the clinic is closed that cannot wait until the next business day, you can page your doctor at the number below.    Please note that while we do our best to be available for urgent issues outside of office hours, we are not available 24/7.   If you have an urgent issue and are unable to reach [place], you may choose to seek medical care at your doctor's office, retail clinic, urgent care center, or emergency room.  If you have a medical emergency, please immediately call 911 or go to the emergency department.  Pager Numbers  - Dr. Nehemiah Massed: [phone]  - Dr. Laurence Ferrari: [phone]  - Dr. Nicole Kindred: [phone]  In the event of inclement weather, please call our main line at [phone] for an update on the status of any delays or closures.  Dermatology Medication Tips: Please keep the boxes that topical medications come in in order to help keep track of the instructions about where and how to use these. Pharmacies typically print the medication instructions only on the boxes and not directly on the medication tubes.   If your medication is too expensive, please contact our office at [phone] option 4 or send [place] a message through [place].   We are unable to tell what your co-pay for medications will be in advance as this is different depending on your insurance coverage. However, we may be able to find a substitute medication at lower cost or fill out paperwork to get insurance to cover a needed medication.   If a prior authorization is required to get your medication  covered by your insurance company, please allow [place] 1-2 business days to complete this process.  Drug prices often vary depending on where the prescription is filled and some pharmacies may offer cheaper prices.  The website www.goodrx.com contains coupons for medications through different pharmacies. The prices here do not account for what the cost may be with help from insurance (it may be cheaper with your insurance), but the website can give you the price if you did not use any insurance.  - You can print the associated coupon and take it with your prescription to the pharmacy.  - You may also stop by our office during regular business hours and pick up a GoodRx coupon card.  - If you need your prescription sent electronically to a different pharmacy, notify our office through [facility] or by phone at [phone]  option 4.     Si Usted Necesita Algo Despus de Su Visita  Tambin puede enviarnos un mensaje a travs de Pharmacist, community. Por lo general respondemos a los mensajes de MyChart en el transcurso de 1 a 2 das hbiles.  Para renovar recetas, por favor pida a su farmacia que se ponga en contacto con nuestra oficina. Harland Dingwall de fax es [place] [phone].  Si tiene un asunto urgente cuando la clnica est cerrada y que no puede esperar hasta el siguiente da hbil, puede llamar/localizar a su doctor(a) al nmero que aparece a continuacin.   Por favor, tenga en cuenta que aunque hacemos todo lo posible para estar disponibles para asuntos urgentes fuera del horario de [place], no estamos disponibles las 24 horas del da, los 7 das de la [place].   Si tiene un problema urgente y no puede comunicarse con nosotros, puede optar por buscar atencin mdica  en el consultorio de su doctor(a), en una clnica privada, en un centro de atencin urgente o en una sala de emergencias.  Si tiene Engineering geologist, por favor llame inmediatamente al 911 o vaya a la sala de  emergencias.  Nmeros de bper  - Dr. Nehemiah Massed: [phone]  - Dra. Moye: [phone]  - Dra. Nicole Kindred: [phone]  En caso de inclemencias del [place], por favor llame a Johnsie Kindred principal al [phone] para una actualizacin sobre el [place] de cualquier retraso o cierre.  Consejos para la medicacin en dermatologa: Por favor, guarde las cajas en las que vienen los medicamentos de uso tpico para ayudarle a seguir las instrucciones sobre dnde y cmo usarlos. Las farmacias generalmente imprimen las instrucciones del medicamento slo en las cajas y no directamente en los tubos del [place].   Si su medicamento es muy caro, por favor, pngase en contacto con Zigmund Daniel llamando al [phone] y presione la opcin 4 o envenos un mensaje a travs de Pharmacist, community.   No podemos decirle cul ser su copago por los medicamentos por adelantado ya que esto es diferente dependiendo de la cobertura de su seguro. Sin embargo, es posible que podamos encontrar un medicamento sustituto a Electrical engineer un formulario para que el seguro cubra el medicamento que se considera necesario.   Si se requiere una autorizacin previa para que su compaa de seguros [place] su medicamento, por favor permtanos de 1 a 2 das hbiles para completar este proceso.  Los precios de los medicamentos varan con frecuencia dependiendo del Environmental consultant de dnde se surte la receta y alguna farmacias pueden ofrecer precios ms baratos.  El sitio web www.goodrx.com tiene cupones para medicamentos de Airline pilot. Los precios aqu no tienen en cuenta lo que podra costar con la ayuda del seguro (puede ser ms barato con su seguro), pero el sitio web puede darle el precio si no utiliz Research scientist (physical sciences).  - Puede imprimir el cupn correspondiente y llevarlo con su receta a la farmacia.  - Tambin puede pasar por nuestra oficina durante el horario de atencin regular y Charity fundraiser una tarjeta de cupones de GoodRx.  - Si  necesita que su receta se enve electrnicamente a una farmacia diferente, informe a nuestra oficina a travs de MyChart de [place] o por telfono llamando al [phone] y presione la opcin 4.

## 2022-07-08 ENCOUNTER — Encounter: Payer: Self-pay | Admitting: Family Medicine

## 2022-07-08 ENCOUNTER — Encounter: Payer: Self-pay | Admitting: Dermatology

## 2022-07-11 ENCOUNTER — Telehealth: Payer: Self-pay

## 2022-07-11 MED ORDER — HYDROCHLOROTHIAZIDE 12.5 MG PO CAPS: 12.5000 mg | ORAL_CAPSULE | Freq: Every day | ORAL | 0 refills | Status: DC

## 2022-07-11 NOTE — Telephone Encounter (Addendum)
Called and spoke to patient regarding result. Patient scheduled for treatment. Denies other questions at this time.  ----- Message from Alfonso Patten, MD sent at 07/11/2022  8:31 AM EST ----- 1. Skin , right pretibia SQUAMOUS CELL CARCINOMA IN SITU, BASE INVOLVED AND ULCER WITH INFLAMED GRANULATION TISSUE, SEE DESCRIPTION --> ED&C  2. Skin , left medial calf WELL DIFFERENTIATED SQUAMOUS CELL CARCINOMA, SEE DESCRIPTION --> ED&C  MAs please call with results and schedule. Please advise it may take a few months for her legs to heal. There is a wound healing gel she can order online at [email or web address].stratpharma-[email or web address] to help heal faster called [employer] and/or we could consider putting her in Unna boots (special wraps over her feet and legs) to help heal faster provided she has good blood flow to her feet.. Thank you!

## 2022-07-12 ENCOUNTER — Other Ambulatory Visit (INDEPENDENT_AMBULATORY_CARE_PROVIDER_SITE_OTHER): Payer: Self-pay | Admitting: Vascular Surgery

## 2022-07-12 ENCOUNTER — Encounter (INDEPENDENT_AMBULATORY_CARE_PROVIDER_SITE_OTHER): Payer: Self-pay

## 2022-07-19 DIAGNOSIS — Z0100 Encounter for examination of eyes and vision without abnormal findings: Secondary | ICD-10-CM | POA: Diagnosis not present

## 2022-07-19 DIAGNOSIS — H353221 Exudative age-related macular degeneration, left eye, with active choroidal neovascularization: Secondary | ICD-10-CM | POA: Diagnosis not present

## 2022-07-20 DIAGNOSIS — H524 Presbyopia: Secondary | ICD-10-CM | POA: Diagnosis not present

## 2022-07-25 ENCOUNTER — Encounter: Payer: Self-pay | Admitting: Internal Medicine

## 2022-07-25 ENCOUNTER — Other Ambulatory Visit: Payer: Self-pay

## 2022-07-25 ENCOUNTER — Emergency Department: Payer: Medicare HMO

## 2022-07-25 ENCOUNTER — Inpatient Hospital Stay
Admission: EM | Admit: 2022-07-25 | Discharge: 2022-07-27 | DRG: 871 | Disposition: A | Discharge status: Home Health | Payer: Medicare HMO | Attending: Internal Medicine | Admitting: Internal Medicine

## 2022-07-25 ENCOUNTER — Other Ambulatory Visit (INDEPENDENT_AMBULATORY_CARE_PROVIDER_SITE_OTHER): Payer: Medicare HMO

## 2022-07-25 ENCOUNTER — Encounter (INDEPENDENT_AMBULATORY_CARE_PROVIDER_SITE_OTHER): Payer: Medicare HMO

## 2022-07-25 DIAGNOSIS — J441 Chronic obstructive pulmonary disease with (acute) exacerbation: Secondary | ICD-10-CM | POA: Diagnosis not present

## 2022-07-25 DIAGNOSIS — Z9981 Dependence on supplemental oxygen: Secondary | ICD-10-CM

## 2022-07-25 DIAGNOSIS — J208 Acute bronchitis due to other specified organisms: Secondary | ICD-10-CM | POA: Insufficient documentation

## 2022-07-25 DIAGNOSIS — E43 Unspecified severe protein-calorie malnutrition: Secondary | ICD-10-CM | POA: Insufficient documentation

## 2022-07-25 DIAGNOSIS — J449 Chronic obstructive pulmonary disease, unspecified: Secondary | ICD-10-CM | POA: Diagnosis not present

## 2022-07-25 DIAGNOSIS — H3530 Unspecified macular degeneration: Secondary | ICD-10-CM | POA: Diagnosis present

## 2022-07-25 DIAGNOSIS — I160 Hypertensive urgency: Secondary | ICD-10-CM | POA: Diagnosis present

## 2022-07-25 DIAGNOSIS — R0789 Other chest pain: Secondary | ICD-10-CM | POA: Diagnosis not present

## 2022-07-25 DIAGNOSIS — Z7982 Long term (current) use of aspirin: Secondary | ICD-10-CM

## 2022-07-25 DIAGNOSIS — J9611 Chronic respiratory failure with hypoxia: Secondary | ICD-10-CM | POA: Diagnosis present

## 2022-07-25 DIAGNOSIS — Z8551 Personal history of malignant neoplasm of bladder: Secondary | ICD-10-CM | POA: Diagnosis not present

## 2022-07-25 DIAGNOSIS — Z7951 Long term (current) use of inhaled steroids: Secondary | ICD-10-CM

## 2022-07-25 DIAGNOSIS — R6520 Severe sepsis without septic shock: Secondary | ICD-10-CM | POA: Diagnosis not present

## 2022-07-25 DIAGNOSIS — J189 Pneumonia, unspecified organism: Secondary | ICD-10-CM | POA: Diagnosis not present

## 2022-07-25 DIAGNOSIS — J159 Unspecified bacterial pneumonia: Secondary | ICD-10-CM | POA: Diagnosis not present

## 2022-07-25 DIAGNOSIS — Z79899 Other long term (current) drug therapy: Secondary | ICD-10-CM

## 2022-07-25 DIAGNOSIS — R0689 Other abnormalities of breathing: Secondary | ICD-10-CM | POA: Diagnosis not present

## 2022-07-25 DIAGNOSIS — R079 Chest pain, unspecified: Secondary | ICD-10-CM

## 2022-07-25 DIAGNOSIS — Z87891 Personal history of nicotine dependence: Secondary | ICD-10-CM | POA: Diagnosis not present

## 2022-07-25 DIAGNOSIS — E876 Hypokalemia: Secondary | ICD-10-CM | POA: Diagnosis present

## 2022-07-25 DIAGNOSIS — Z1152 Encounter for screening for COVID-19: Secondary | ICD-10-CM

## 2022-07-25 DIAGNOSIS — Z66 Do not resuscitate: Secondary | ICD-10-CM | POA: Diagnosis not present

## 2022-07-25 DIAGNOSIS — R0602 Shortness of breath: Secondary | ICD-10-CM | POA: Diagnosis not present

## 2022-07-25 DIAGNOSIS — B9689 Other specified bacterial agents as the cause of diseases classified elsewhere: Secondary | ICD-10-CM | POA: Diagnosis not present

## 2022-07-25 DIAGNOSIS — I739 Peripheral vascular disease, unspecified: Secondary | ICD-10-CM | POA: Diagnosis present

## 2022-07-25 DIAGNOSIS — J209 Acute bronchitis, unspecified: Secondary | ICD-10-CM | POA: Diagnosis present

## 2022-07-25 DIAGNOSIS — E872 Acidosis, unspecified: Secondary | ICD-10-CM | POA: Insufficient documentation

## 2022-07-25 DIAGNOSIS — J9601 Acute respiratory failure with hypoxia: Secondary | ICD-10-CM | POA: Insufficient documentation

## 2022-07-25 DIAGNOSIS — N179 Acute kidney failure, unspecified: Secondary | ICD-10-CM | POA: Insufficient documentation

## 2022-07-25 DIAGNOSIS — I10 Essential (primary) hypertension: Secondary | ICD-10-CM | POA: Diagnosis not present

## 2022-07-25 DIAGNOSIS — T380X5A Adverse effect of glucocorticoids and synthetic analogues, initial encounter: Secondary | ICD-10-CM | POA: Diagnosis present

## 2022-07-25 DIAGNOSIS — R739 Hyperglycemia, unspecified: Secondary | ICD-10-CM | POA: Diagnosis present

## 2022-07-25 DIAGNOSIS — Z681 Body mass index (BMI) 19 or less, adult: Secondary | ICD-10-CM | POA: Diagnosis not present

## 2022-07-25 DIAGNOSIS — A419 Sepsis, unspecified organism: Secondary | ICD-10-CM | POA: Diagnosis not present

## 2022-07-25 DIAGNOSIS — R7401 Elevation of levels of liver transaminase levels: Secondary | ICD-10-CM | POA: Insufficient documentation

## 2022-07-25 DIAGNOSIS — R000 Tachycardia, unspecified: Secondary | ICD-10-CM | POA: Diagnosis present

## 2022-07-25 DIAGNOSIS — J440 Chronic obstructive pulmonary disease with acute lower respiratory infection: Secondary | ICD-10-CM | POA: Diagnosis not present

## 2022-07-25 DIAGNOSIS — H409 Unspecified glaucoma: Secondary | ICD-10-CM | POA: Diagnosis present

## 2022-07-25 DIAGNOSIS — M810 Age-related osteoporosis without current pathological fracture: Secondary | ICD-10-CM | POA: Diagnosis present

## 2022-07-25 DIAGNOSIS — J9621 Acute and chronic respiratory failure with hypoxia: Secondary | ICD-10-CM | POA: Diagnosis not present

## 2022-07-25 DIAGNOSIS — D72825 Bandemia: Secondary | ICD-10-CM | POA: Insufficient documentation

## 2022-07-25 DIAGNOSIS — J439 Emphysema, unspecified: Secondary | ICD-10-CM | POA: Diagnosis not present

## 2022-07-25 DIAGNOSIS — R062 Wheezing: Secondary | ICD-10-CM | POA: Diagnosis not present

## 2022-07-25 DIAGNOSIS — Z85828 Personal history of other malignant neoplasm of skin: Secondary | ICD-10-CM

## 2022-07-25 LAB — COMPREHENSIVE METABOLIC PANEL
ALT: 67 U/L — ABNORMAL HIGH (ref 0–44)
AST: 53 U/L — ABNORMAL HIGH (ref 15–41)
Albumin: 4 g/dL (ref 3.5–5.0)
Alkaline Phosphatase: 93 U/L (ref 38–126)
Anion gap: 16 — ABNORMAL HIGH (ref 5–15)
BUN: 21 mg/dL (ref 8–23)
CO2: 25 mmol/L (ref 22–32)
Calcium: 9.5 mg/dL (ref 8.9–10.3)
Chloride: 96 mmol/L — ABNORMAL LOW (ref 98–111)
Creatinine, Ser: 0.98 mg/dL (ref 0.44–1.00)
GFR, Estimated: 56 mL/min — ABNORMAL LOW (ref >60)
Glucose, Bld: 256 mg/dL — ABNORMAL HIGH (ref 70–99)
Potassium: 3.5 mmol/L (ref 3.5–5.1)
Sodium: 137 mmol/L (ref 135–145)
Total Bilirubin: 1.1 mg/dL (ref 0.3–1.2)
Total Protein: 7.4 g/dL (ref 6.5–8.1)

## 2022-07-25 LAB — RESP PANEL BY RT-PCR (RSV, FLU A&B, COVID)  RVPGX2
Influenza A by PCR: NEGATIVE
Influenza B by PCR: NEGATIVE
Resp Syncytial Virus by PCR: NEGATIVE
SARS Coronavirus 2 by RT PCR: NEGATIVE

## 2022-07-25 LAB — LACTIC ACID, PLASMA
Lactic Acid, Venous: 6.9 mmol/L (ref 0.5–1.9)
Lactic Acid, Venous: 6.3 mmol/L (ref 0.5–1.9)

## 2022-07-25 LAB — BLOOD GAS, VENOUS
Acid-Base Excess: 2.2 mmol/L — ABNORMAL HIGH (ref 0.0–2.0)
Acid-base deficit: 1.4 mmol/L (ref 0.0–2.0)
Bicarbonate: 27.8 mmol/L (ref 20.0–28.0)
Bicarbonate: 26 mmol/L (ref 20.0–28.0)
O2 Saturation: 94.5 %
O2 Saturation: 90.8 %
Patient temperature: 37
Patient temperature: 37
pCO2, Ven: 46 mmHg (ref 44–60)
pCO2, Ven: 54 mmHg (ref 44–60)
pH, Ven: 7.39 (ref 7.25–7.43)
pH, Ven: 7.29 (ref 7.25–7.43)
pO2, Ven: 70 mmHg — ABNORMAL HIGH (ref 32–45)
pO2, Ven: 64 mmHg — ABNORMAL HIGH (ref 32–45)

## 2022-07-25 LAB — TROPONIN I (HIGH SENSITIVITY)
Troponin I (High Sensitivity): 9 ng/L (ref <18)
Troponin I (High Sensitivity): 11 ng/L (ref <18)

## 2022-07-25 LAB — CBC WITH DIFFERENTIAL/PLATELET
Abs Immature Granulocytes: 0.02 10*3/uL (ref 0.00–0.07)
Basophils Absolute: 0 10*3/uL (ref 0.0–0.1)
Basophils Relative: 0 %
Eosinophils Absolute: 0 10*3/uL (ref 0.0–0.5)
Eosinophils Relative: 0 %
HCT: 39.7 % (ref 36.0–46.0)
Hemoglobin: 12.8 g/dL (ref 12.0–15.0)
Immature Granulocytes: 0 %
Lymphocytes Relative: 15 %
Lymphs Abs: 1.4 10*3/uL (ref 0.7–4.0)
MCH: 30.8 pg (ref 26.0–34.0)
MCHC: 32.2 g/dL (ref 30.0–36.0)
MCV: 95.7 fL (ref 80.0–100.0)
Monocytes Absolute: 1.3 10*3/uL — ABNORMAL HIGH (ref 0.1–1.0)
Monocytes Relative: 14 %
Neutro Abs: 6.9 10*3/uL (ref 1.7–7.7)
Neutrophils Relative %: 71 %
Platelets: 287 10*3/uL (ref 150–400)
RBC: 4.15 MIL/uL (ref 3.87–5.11)
RDW: 12.8 % (ref 11.5–15.5)
Smear Review: NORMAL
WBC Morphology: INCREASED
WBC: 9.7 10*3/uL (ref 4.0–10.5)
nRBC: 0 % (ref 0.0–0.2)

## 2022-07-25 LAB — BETA-HYDROXYBUTYRIC ACID: Beta-Hydroxybutyric Acid: 0.28 mmol/L — ABNORMAL HIGH (ref 0.05–0.27)

## 2022-07-25 LAB — CBG MONITORING, ED
Glucose-Capillary: 290 mg/dL — ABNORMAL HIGH (ref 70–99)
Glucose-Capillary: 178 mg/dL — ABNORMAL HIGH (ref 70–99)

## 2022-07-25 LAB — PROTIME-INR
INR: 1.1 (ref 0.8–1.2)
Prothrombin Time: 14.1 seconds (ref 11.4–15.2)

## 2022-07-25 LAB — CREATININE, SERUM
Creatinine, Ser: 1.11 mg/dL — ABNORMAL HIGH (ref 0.44–1.00)
GFR, Estimated: 48 mL/min — ABNORMAL LOW (ref >60)

## 2022-07-25 LAB — APTT: aPTT: 36 seconds (ref 24–36)

## 2022-07-25 MED ORDER — ALBUTEROL SULFATE (2.5 MG/3ML) 0.083% IN NEBU
2.5000 mg | INHALATION_SOLUTION | Freq: Once | RESPIRATORY_TRACT | Status: AC
  Administered 2022-07-25: 2.5 mg via RESPIRATORY_TRACT

## 2022-07-25 MED ORDER — ENOXAPARIN SODIUM 30 MG/0.3ML IJ SOSY
30.0000 mg | PREFILLED_SYRINGE | INTRAMUSCULAR | Status: DC
  Administered 2022-07-25 – 2022-07-26 (×2): 30 mg via SUBCUTANEOUS
  Filled 2022-07-25 (×2): qty 0.3

## 2022-07-25 MED ORDER — IPRATROPIUM-ALBUTEROL 0.5-2.5 (3) MG/3ML IN SOLN
3.0000 mL | Freq: Once | RESPIRATORY_TRACT | Status: AC
  Administered 2022-07-25: 3 mL via RESPIRATORY_TRACT

## 2022-07-25 MED ORDER — ALBUTEROL SULFATE (2.5 MG/3ML) 0.083% IN NEBU
INHALATION_SOLUTION | RESPIRATORY_TRACT | Status: AC
  Administered 2022-07-25: 2.5 mg via RESPIRATORY_TRACT
  Filled 2022-07-25: qty 9

## 2022-07-25 MED ORDER — IPRATROPIUM-ALBUTEROL 0.5-2.5 (3) MG/3ML IN SOLN
3.0000 mL | Freq: Once | RESPIRATORY_TRACT | Status: AC
  Administered 2022-07-25: 3 mL via RESPIRATORY_TRACT
  Filled 2022-07-25: qty 6

## 2022-07-25 MED ORDER — LORAZEPAM 2 MG/ML IJ SOLN
0.2500 mg | Freq: Once | INTRAMUSCULAR | Status: AC
  Administered 2022-07-25: 0.25 mg via INTRAVENOUS

## 2022-07-25 MED ORDER — INSULIN ASPART 100 UNIT/ML IJ SOLN
0.0000 [IU] | Freq: Three times a day (TID) | INTRAMUSCULAR | Status: DC
  Administered 2022-07-25: 8 [IU] via SUBCUTANEOUS
  Administered 2022-07-26: 3 [IU] via SUBCUTANEOUS
  Administered 2022-07-26: 8 [IU] via SUBCUTANEOUS
  Administered 2022-07-27 (×2): 2 [IU] via SUBCUTANEOUS
  Filled 2022-07-25 (×5): qty 1

## 2022-07-25 MED ORDER — CLOPIDOGREL BISULFATE 75 MG PO TABS
75.0000 mg | ORAL_TABLET | Freq: Every day | ORAL | Status: DC
  Administered 2022-07-25 – 2022-07-27 (×3): 75 mg via ORAL
  Filled 2022-07-25 (×3): qty 1

## 2022-07-25 MED ORDER — IPRATROPIUM-ALBUTEROL 0.5-2.5 (3) MG/3ML IN SOLN
RESPIRATORY_TRACT | Status: AC
  Filled 2022-07-25: qty 9

## 2022-07-25 MED ORDER — SODIUM CHLORIDE 0.9 % IV SOLN
500.0000 mg | INTRAVENOUS | Status: DC
  Administered 2022-07-25 – 2022-07-26 (×2): 500 mg via INTRAVENOUS
  Filled 2022-07-25 (×3): qty 5

## 2022-07-25 MED ORDER — MOMETASONE FURO-FORMOTEROL FUM 200-5 MCG/ACT IN AERO
2.0000 | INHALATION_SPRAY | Freq: Two times a day (BID) | RESPIRATORY_TRACT | Status: DC
  Administered 2022-07-25 – 2022-07-27 (×4): 2 via RESPIRATORY_TRACT
  Filled 2022-07-25: qty 8.8

## 2022-07-25 MED ORDER — DILTIAZEM HCL 25 MG/5ML IV SOLN
15.0000 mg | Freq: Four times a day (QID) | INTRAVENOUS | Status: DC | PRN
  Administered 2022-07-25: 15 mg via INTRAVENOUS
  Filled 2022-07-25: qty 5

## 2022-07-25 MED ORDER — LORAZEPAM 2 MG/ML IJ SOLN
INTRAMUSCULAR | Status: AC
  Administered 2022-07-25: 0.25 mg via INTRAVENOUS
  Filled 2022-07-25: qty 1

## 2022-07-25 MED ORDER — METHYLPREDNISOLONE SODIUM SUCC 125 MG IJ SOLR
60.0000 mg | Freq: Two times a day (BID) | INTRAMUSCULAR | Status: DC
  Administered 2022-07-25 – 2022-07-27 (×4): 60 mg via INTRAVENOUS
  Filled 2022-07-25 (×4): qty 2

## 2022-07-25 MED ORDER — IPRATROPIUM-ALBUTEROL 0.5-2.5 (3) MG/3ML IN SOLN
3.0000 mL | Freq: Four times a day (QID) | RESPIRATORY_TRACT | Status: DC
  Administered 2022-07-25 – 2022-07-27 (×8): 3 mL via RESPIRATORY_TRACT
  Filled 2022-07-25 (×8): qty 3

## 2022-07-25 MED ORDER — HYDRALAZINE HCL 20 MG/ML IJ SOLN
10.0000 mg | INTRAMUSCULAR | Status: DC | PRN
  Filled 2022-07-25: qty 1

## 2022-07-25 MED ORDER — LACTATED RINGERS IV BOLUS
1000.0000 mL | Freq: Once | INTRAVENOUS | Status: AC
  Administered 2022-07-25: 1000 mL via INTRAVENOUS

## 2022-07-25 MED ORDER — SODIUM CHLORIDE 0.9 % IV SOLN
1.0000 g | INTRAVENOUS | Status: DC
  Administered 2022-07-25 – 2022-07-26 (×2): 1 g via INTRAVENOUS
  Filled 2022-07-25 (×3): qty 10

## 2022-07-25 MED ORDER — VITAMIN D 25 MCG (1000 UNIT) PO TABS
5000.0000 [IU] | ORAL_TABLET | Freq: Every day | ORAL | Status: DC
  Administered 2022-07-26 – 2022-07-27 (×2): 5000 [IU] via ORAL
  Filled 2022-07-25 (×2): qty 5

## 2022-07-25 MED ORDER — MORPHINE SULFATE (PF) 2 MG/ML IV SOLN
2.0000 mg | Freq: Once | INTRAVENOUS | Status: AC
  Administered 2022-07-25: 2 mg via INTRAVENOUS
  Filled 2022-07-25: qty 1

## 2022-07-25 MED ORDER — INSULIN ASPART 100 UNIT/ML IJ SOLN: 0.0000 [IU] | Freq: Every day | INTRAMUSCULAR | Status: DC

## 2022-07-25 MED ORDER — LORAZEPAM 0.5 MG PO TABS: 0.2500 mg | ORAL_TABLET | Freq: Once | ORAL | Status: DC

## 2022-07-25 MED ORDER — LORAZEPAM 2 MG/ML IJ SOLN: 0.2500 mg | Freq: Once | INTRAMUSCULAR | Status: AC

## 2022-07-25 MED ORDER — DOCUSATE SODIUM 100 MG PO CAPS
100.0000 mg | ORAL_CAPSULE | Freq: Two times a day (BID) | ORAL | Status: DC
  Administered 2022-07-25 – 2022-07-27 (×3): 100 mg via ORAL
  Filled 2022-07-25 (×3): qty 1

## 2022-07-25 NOTE — ED Notes (Signed)
Provider aware of pts lactic acid of 6.9

## 2022-07-25 NOTE — Assessment & Plan Note (Signed)
Insulin started

## 2022-07-25 NOTE — ED Notes (Signed)
Pt removed her IV at this time. Bleeding controlled at this time.

## 2022-07-25 NOTE — Assessment & Plan Note (Signed)
Bilevel positive airway pressure, continuous pulse oximetry.  Telemetry for 12 hours

## 2022-07-25 NOTE — ED Notes (Signed)
Pts resp. Rate still in the 30s, HR is 145. Dr. Starleen Blue aware and wants to continue breathing treatments through the simple mask

## 2022-07-25 NOTE — Assessment & Plan Note (Signed)
Seems to be sinus. Keep on telemtry. Rate control trend.

## 2022-07-25 NOTE — Progress Notes (Signed)
PHARMACIST - PHYSICIAN COMMUNICATION  CONCERNING:  Enoxaparin (Lovenox) for DVT Prophylaxis    RECOMMENDATION: Patient was prescribed enoxaprin '40mg'$  q24 hours for VTE prophylaxis.   Filed Weights   07/25/22 1114  Weight: 42.2 kg (93 lb)    Body mass index is 17.57 kg/m.  Estimated Creatinine Clearance: 27.5 mL/min (by C-G formula based on SCr of 0.98 mg/dL).   Patient is candidate for enoxaparin '30mg'$  every 24 hours based on CrCl <54m/min or Weight <45kg  DESCRIPTION: Pharm[facility] has [facility]  Patient is now receiving enoxaparin 30 mg every 24 hours    JAlison Murray PharmD Clinical Pharmacist  07/25/2022 1:47 PM

## 2022-07-25 NOTE — Assessment & Plan Note (Signed)
Due to increased work of breathing. Patinet looking non toxic. Trend.

## 2022-07-25 NOTE — Assessment & Plan Note (Signed)
Check acute hepatitis panel.

## 2022-07-25 NOTE — Assessment & Plan Note (Signed)
Uncontroled.  Use diltiazem.  Hydralazine as second line agent

## 2022-07-25 NOTE — ED Notes (Signed)
Pt tolerating biPAP, resp rate is no 28 breaths per min. HR is 140s, MD aware. Holding off on albuterol treatments for now. BP stable at 161/75. Pt states she is much more comfortable after given 0.'25mg'$  of ativan.

## 2022-07-25 NOTE — ED Notes (Signed)
ED MD wants to try nasal high flow since the pt expresses that she cannot tolerate bipap any longer. Writer called resp. But there was no answer.

## 2022-07-25 NOTE — Assessment & Plan Note (Signed)
Draw cultures and start ceftriaxone and azithromycin

## 2022-07-25 NOTE — ED Notes (Signed)
Called RT for ABG at this time.

## 2022-07-25 NOTE — Assessment & Plan Note (Addendum)
S/p inhaled bronchodilator therapy as well as intravenous steroids.  At this time patient physical exam is notable for marked "tightness ".  I favor continued intravenous steroids as well as inhaled bronchodilator therapy.  Control continue with BiPAP as needed.  See bandemia for antibiotics.  Also noted is that the chest x-ray is concerning for bulla formation.  Therefore we will get a CAT scan to define this further.  To see if pulmonary input is needed

## 2022-07-25 NOTE — ED Triage Notes (Signed)
Pt w/ hx of COPD has been feeling short of breath along with having chest tightness for 4 days. She has tried breathing treatments at home but they did not help. En route via EMS, EMS administered 2G of magnesium IV, '125mg'$  of solumedrol and 2 duonebs

## 2022-07-25 NOTE — H&P (Signed)
History and Physical    Patient: Misty Ferguson DXA:[id number] DOB: 1934-06-29 DOA: 07/25/2022 DOS: the patient was seen and examined on 07/25/2022 PCP: [name], MD  Patient coming from: Home  Chief Complaint:  Chief Complaint  Patient presents with   Shortness of Breath   HPI: Misty Ferguson is a 88 y.o. female with medical history significant of COPD.  Generally uses 4 L/min of supplemental oxygen at home at all times.  In spite of this patient does report some sensation of shortness of breath with exertion such as walking on level ground for prolonged distances.  Patient was in her usual state of health till about 4 days ago.  At that time patient had an insidious onset of worsening of her sensation of shortness of breath.  It was initially only present with exertion however over the last 3 days it has been present with any level of activity such as merely trying to get out of bed.  It has since then progressed patient could not sleep last night because of persistent sensation of shortness of breath.  This has been associated with a cough with clear sputum moderate amount to minimal amount.  The last 2 days.  There is no fever.  There is no palpitations or presyncope.  No fall.  Patient has had bilateral upper anterior chest discomfort/pain on and off fleeting over the last 2 days.  Patient started using 5 L/min of oxygen supplementation today.  In spite of that she had persistent sensation of shortness of breath.  Therefore she asked her husband to call the ambulance.  Patient's ER course is notable for marked respiratory distress on admission, patient was stabilized with BiPAP use.  Patient initially did not tolerate the BiPAP very well.  However at this time has received some Ativan to help in this regard and is now feeling better.  Husband is at the bedside for this encounter  No nausea vomiting or diarrhea.  Patient has chronic leg cramping with exertion related to her peripheral  arterial disease, no change.  Review of Systems: As mentioned in the history of present illness. All other systems reviewed and are negative. Past Medical History:  Diagnosis Date   AK (actinic keratosis) 12/08/2020   right pretibia inferior bx proven, LN2 01/10/21   Burping    Chronic airway obstruction, not elsewhere classified    Dyspnea    Dysrhythmia    Macular degeneration (senile) of retina, unspecified    Malignant neoplasm of urinary bladder ([place]) 03/2019   Partial bladder resection and Rad tx's.    Obstructive chronic bronchitis with exacerbation ([place])    Osteoporosis, unspecified    Oxygen deficiency    Personal history of peptic ulcer disease    SCC (squamous cell carcinoma) 12/08/2020   right pretibia superior, EDC 01/10/21   SCC (squamous cell carcinoma) 07/03/2022   left medial calf needs ED&C   Squamous cell carcinoma in situ (SCCIS) 07/03/2022   right pretibia sccis needs ED&C   Tobacco use disorder    Unspecified essential hypertension    Unspecified glaucoma(365.9)    Past Surgical History:  Procedure Laterality Date   CATARACT EXTRACTION W/ INTRAOCULAR LENS  IMPLANT, BILATERAL     CYSTOSCOPY WITH STENT PLACEMENT Left 05/22/2019   Procedure: CYSTOSCOPY WITH STENT PLACEMENT;  Surgeon: Billey Co, MD;  Location: [facility];  Service: Urology;  Laterality: Left;   ELBOW FRACTURE SURGERY Left    LOWER EXTREMITY ANGIOGRAPHY Right 11/20/2021   Procedure:  Lower Extremity Angiography;  Surgeon: Algernon Huxley, MD;  Location: [facility];  Service: Cardiovascular;  Laterality: Right;   TRANSURETHRAL RESECTION OF BLADDER TUMOR N/A 05/22/2019   Procedure: TRANSURETHRAL RESECTION OF BLADDER TUMOR (TURBT);  Surgeon: Billey Co, MD;  Location: [facility];  Service: Urology;  Laterality: N/A;   Social History:  reports that she quit smoking about 25 years ago. Her smoking use included cigarettes. She has a 58.50 pack-year smoking history. She has never used  smokeless tobacco. She reports current alcohol use of about 14.0 standard drinks of alcohol per week. She reports that she does not use drugs.  No Known Allergies  Family History  Adopted: Yes    Prior to Admission medications   Medication Sig Start Date End Date Taking? Authorizing Provider  ascorbic acid (VITAMIN C) 1000 MG tablet Take 1,000 mg by mouth daily.     [provider]  aspirin EC 81 MG tablet Take 1 tablet (81 mg total) by mouth daily. 11/20/21   Algernon Huxley, MD  atorvastatin (LIPITOR) 10 MG tablet TAKE 1 TABLET EVERY DAY 07/12/22   Algernon Huxley, MD  budesonide-formoterol [facility]) 160-4.5 MCG/ACT inhaler Inhale 2 puffs into the lungs in the morning and at bedtime. 12/13/20   Flora Lipps, MD  Cholecalciferol (VITAMIN D) 125 MCG (5000 UT) CAPS Take 5,000 Units by mouth daily.    [provider]  clopidogrel (PLAVIX) 75 MG tablet TAKE 1 TABLET EVERY DAY 07/12/22   Algernon Huxley, MD  docusate sodium (COLACE) 100 MG capsule Take 100 mg by mouth 2 (two) times daily.    [provider]  gabapentin (NEURONTIN) 300 MG capsule Take 1 capsule (300 mg total) by mouth at bedtime. 05/18/22   Kris Hartmann, NP  hydrochlorothiazide (MICROZIDE) 12.5 MG capsule Take 1 capsule (12.5 mg total) by mouth daily. 07/11/22   [name], Amy E, MD  losartan (COZAAR) 25 MG tablet 25 mg daily.  08/25/19   [provider]  Multiple Vitamins-Minerals (PRESERVISION AREDS) TABS Take 1 tablet by mouth 2 (two) times daily.    [provider]  Omega-3 Fatty Acids (FISH OIL) 1000 MG CAPS Take 1,000 mg by mouth 2 (two) times daily.     [provider]  OXYGEN Inhale 4 L into the lungs as needed.    [provider]  verapamil (VERELAN PM) 360 MG 24 hr capsule Take 360 mg by mouth daily. 10/20/19   [provider]  Vitamin E 180 MG CAPS Take 180 mg by mouth daily.    [provider]    Physical Exam: Vitals:   07/25/22 1133 07/25/22  1209 07/25/22 1230 07/25/22 1249  BP: (!) 206/116 134/69 (!) 177/73 (!) 160/91  Pulse: (!) 144 (!) 135 (!) 134 (!) 130  Resp: (!) 29  (!) 34   Temp:      TempSrc:      SpO2: 100%  98% 100%  Weight:      Height:       Patient is on bilevel positive airway pressure device by external ventilator.  Patient is sitting up in bed supported by the backrest.  Husband at the bedside.  Patient is alert and awake and is breathing at the above indicated rate however does not display other signs of distress such as restlessness or interruption of speech.  Patient is a thin lady I could say she is bordering on emaciation.  Patient give the history herself. Respiratory exam:  Diffusely markedly diminished breath sounds with occasional expiratory wheezes.  However breath sounds are difficult to auscultate likely due to marked diminishment of sounds as well as the ventilator noise Cardiovascular exam: S1-S2 within normal limit, however marked tachycardia is noted Neurologic exam: Patient is alert awake, gives coherent history, nonfocal exam Skin exam: Some minor bruising, however no marked rash or skin pathology noted Extremity exam: No edema warm  Patient on bipap 45% Data Reviewed:  There are no new results to review at this time.   Results for orders placed or performed during the hospital encounter of 07/25/22 (from the past 24 hour(s))  Comprehensive metabolic panel     Status: Abnormal   Collection Time: 07/25/22 10:32 AM  Result Value Ref Range   Sodium 137 135 - 145 mmol/L   Potassium 3.5 3.5 - 5.1 mmol/L   Chloride 96 (L) 98 - 111 mmol/L   CO2 25 22 - 32 mmol/L   Glucose, Bld 256 (H) 70 - 99 mg/dL   BUN 21 8 - 23 mg/dL   Creatinine, Ser 0.98 0.44 - 1.00 mg/dL   Calcium 9.5 8.9 - 10.3 mg/dL   Total Protein 7.4 6.5 - 8.1 g/dL   Albumin 4.0 3.5 - 5.0 g/dL   AST 53 (H) 15 - 41 U/L   ALT 67 (H) 0 - 44 U/L   Alkaline Phosphatase 93 38 - 126 U/L   Total Bilirubin 1.1 0.3 - 1.2 mg/dL   GFR,  Estimated 56 (L) >60 mL/min   Anion gap 16 (H) 5 - 15  CBC with Differential     Status: Abnormal   Collection Time: 07/25/22 10:32 AM  Result Value Ref Range   WBC 9.7 4.0 - 10.5 K/uL   RBC 4.15 3.87 - 5.11 MIL/uL   Hemoglobin 12.8 12.0 - 15.0 g/dL   HCT 39.7 36.0 - 46.0 %   MCV 95.7 80.0 - 100.0 fL   MCH 30.8 26.0 - 34.0 pg   MCHC 32.2 30.0 - 36.0 g/dL   RDW 12.8 11.5 - 15.5 %   Platelets 287 150 - 400 K/uL   nRBC 0.0 0.0 - 0.2 %   Neutrophils Relative % 71 %   Neutro Abs 6.9 1.7 - 7.7 K/uL   Lymphocytes Relative 15 %   Lymphs Abs 1.4 0.7 - 4.0 K/uL   Monocytes Relative 14 %   Monocytes Absolute 1.3 (H) 0.1 - 1.0 K/uL   Eosinophils Relative 0 %   Eosinophils Absolute 0.0 0.0 - 0.5 K/uL   Basophils Relative 0 %   Basophils Absolute 0.0 0.0 - 0.1 K/uL   WBC Morphology INCREASED BANDS (>20% BANDS)    RBC Morphology MORPHOLOGY UNREMARKABLE    Smear Review Normal platelet morphology    Immature Granulocytes 0 %   Abs Immature Granulocytes 0.02 0.00 - 0.07 K/uL  Troponin I (High Sensitivity)     Status: None   Collection Time: 07/25/22 10:32 AM  Result Value Ref Range   Troponin I (High Sensitivity) 9 <18 ng/L  Blood gas, venous     Status: Abnormal   Collection Time: 07/25/22 10:32 AM  Result Value Ref Range   pH, Ven 7.39 7.25 - 7.43   pCO2, Ven 46 44 - 60 mmHg   pO2, Ven 70 (H) 32 - 45 mmHg   Bicarbonate 27.8 20.0 - 28.0 mmol/L   Acid-Base Excess 2.2 (H) 0.0 - 2.0 mmol/L   O2 Saturation 94.5 %   Patient temperature 37.0  Collection site VEIN   Resp panel by RT-PCR (RSV, Flu A&B, Covid) Anterior Nasal Swab     Status: None   Collection Time: 07/25/22 10:32 AM   Specimen: Anterior Nasal Swab  Result Value Ref Range   SARS Coronavirus 2 by RT PCR NEGATIVE NEGATIVE   Influenza A by PCR NEGATIVE NEGATIVE   Influenza B by PCR NEGATIVE NEGATIVE   Resp Syncytial Virus by PCR NEGATIVE NEGATIVE  Troponin I (High Sensitivity)     Status: None   Collection Time: 07/25/22  12:32 PM  Result Value Ref Range   Troponin I (High Sensitivity) 11 <18 ng/L  Lactic acid, plasma     Status: Abnormal   Collection Time: 07/25/22 12:32 PM  Result Value Ref Range   Lactic Acid, Venous 6.9 (HH) 0.5 - 1.9 mmol/L   EKG reviewed -narrow complex tachycardia  CXR reviewed. I am concerned about bullae formation on left upper lung field. Will request CT chest.  Assessment and Plan: * COPD with acute exacerbation ([place]) S/p inhaled bronchodilator therapy as well as intravenous steroids.  At this time patient physical exam is notable for marked "tightness ".  I favor continued intravenous steroids as well as inhaled bronchodilator therapy.  Control continue with BiPAP as needed.  See bandemia for antibiotics.  Also noted is that the chest x-ray is concerning for bulla formation.  Therefore we will get a CAT scan to define this further.  To see if pulmonary input is needed  Acute respiratory failure with hypoxia (HCC) Bilevel positive airway pressure, continuous pulse oximetry.  Telemetry for 12 hours  Bandemia Draw cultures and start ceftriaxone and azithromycin  Lactic acidosis Due to increased work of breathing. Patinet looking non toxic. Trend.  Chest pain  trop negative. In the setting of couhgin/copd exacerbation. Monitoring.  Transaminitis Check acute hepatitis panel.  Hyperglycemia Insulin started  Tachycardia Seems to be sinus. Keep on telemtry. Rate control trend.  Essential hypertension, benign Uncontroled.  Use diltiazem.  Hydralazine as second line agent      Advance Care Planning:   Code Status: Prior DNR DNI - per patinet. Corobroated by husband   Consults: ct chest first  Family Communication: discussed with husband   Severity of Illness: The appropriate patient status for this patient is INPATIENT. Inpatient status is judged to be reasonable and necessary in order to provide the required intensity of service to ensure the patient's safety. The  patient's presenting symptoms, physical exam findings, and initial radiographic and laboratory data in the context of their chronic comorbidities is felt to place them at high risk for further clinical deterioration. Furthermore, it is not anticipated that the patient will be medically stable for discharge from the hospital within 2 midnights of admission.   * I certify that at the point of admission it is my clinical judgment that the patient will require inpatient hospital care spanning beyond 2 midnights from the point of admission due to high intensity of service, high risk for further deterioration and high frequency of surveillance required.*  Author: Gertie Fey, MD 07/25/2022 1:00 PM  For on call review [email or web address].

## 2022-07-25 NOTE — ED Notes (Signed)
An additional dose of 0.'25mg'$  ativan given per Dr. Starleen Blue. Pt states this helps her feel more comfortable. IV fluids running. Pt still tachy at 138 beats per minute, BP 177/83

## 2022-07-25 NOTE — ED Provider Notes (Addendum)
[facility] Provider Note    Event Date/Time   First MD Initiated Contact with Patient 07/25/22 1017     (approximate)   History   Shortness of Breath   HPI  Misty Ferguson is a 88 y.o. female with past medical history of COPD on 4 L nasal cannula chronically who presents with shortness of breath.  Patient tells me she has been feeling progressively short of breath over the last 4 days has had cough productive of clear sputum and some left upper chest pain.  Denies lower extremity swelling fevers chills nausea vomiting.  She is on 4 L chronically for her COPD.  Bumped her self up to 5 L today.  When EMS arrived she was satting 91% on 5 L had significant work of breathing she was given 56-year-old DuoNebs 125 Solu-Medrol and 2 g of mag.  Noted to be wheezing throughout.  Patient tells me she has never had to be admitted for COPD in the past.  She has been using her Symbicort for her dyspnea.  Does not use a nebulizer at home.     Past Medical History:  Diagnosis Date   AK (actinic keratosis) 12/08/2020   right pretibia inferior bx proven, LN2 01/10/21   Burping    Chronic airway obstruction, not elsewhere classified    Dyspnea    Dysrhythmia    Macular degeneration (senile) of retina, unspecified    Malignant neoplasm of urinary bladder ([place]) 03/2019   Partial bladder resection and Rad tx's.    Obstructive chronic bronchitis with exacerbation ([place])    Osteoporosis, unspecified    Oxygen deficiency    Personal history of peptic ulcer disease    SCC (squamous cell carcinoma) 12/08/2020   right pretibia superior, EDC 01/10/21   SCC (squamous cell carcinoma) 07/03/2022   left medial calf needs ED&C   Squamous cell carcinoma in situ (SCCIS) 07/03/2022   right pretibia sccis needs ED&C   Tobacco use disorder    Unspecified essential hypertension    Unspecified glaucoma(365.9)     Patient Active Problem List   Diagnosis Date Noted   Hyperglycemia  07/25/2022   Transaminitis 07/25/2022   Chest pain 07/25/2022   Lactic acidosis 07/25/2022   Bandemia 07/25/2022   Acute respiratory failure with hypoxia ([place]) 07/25/2022   COPD exacerbation ([place]) 07/25/2022   Serous bulla of skin 05/29/2022   Wheezing 01/23/2022   Acute cough 01/23/2022   Bronchitis 01/23/2022   PVD (peripheral vascular disease) ([place]) 12/14/2021   Decreased pulses in feet 11/06/2021   Skin lesions 11/08/2020   Goals of care, counseling/discussion 06/14/2019   History of bladder cancer 06/08/2019   Constipation 04/25/2019   Tachycardia 10/24/2018   Senile purpura ([place]) 10/04/2016   Peripheral neuropathy 07/24/2016   Chronic hypoxemic respiratory failure ([place]) 08/25/2012   COPD with acute exacerbation ([place]) 07/03/2012   Hyperkalemia 05/04/2009   HYPERCHOLESTEROLEMIA 03/24/2008   Prediabetes 12/11/2006   MACULAR DEGENERATION 12/10/2006   Essential hypertension, benign 12/10/2006   Centrilobular emphysema ([place]) 12/10/2006   Osteoporosis 12/10/2006     Physical Exam  Triage Vital Signs: ED Triage Vitals [07/25/22 1031]  Enc Vitals Group     BP (!) 184/77     Pulse Rate (!) 117     Resp (!) 32     Temp 98.1 F (36.7 C)     Temp Source Oral     SpO2 97 %     Weight      Height  Head Circumference      Peak Flow      Pain Score      Pain Loc      Pain Edu?      Excl. in [place]?     Most recent vital signs: Vitals:   07/25/22 1311 07/25/22 1525  BP: (!) 159/62 (!) 161/86  Pulse: (!) 129 (!) 124  Resp: (!) 36 (!) 33  Temp:    SpO2: 100% 98%     General: Awake, ill-appearing CV:  Good peripheral perfusion.  No peripheral edema Resp:  Increased work of breathing using accessory muscles speaks in 1 or 2 word sentences expiratory wheezing throughout with decreased air movement Abd:  No distention.  Neuro:             Awake, Alert, Oriented x 3  Other:     ED Results / Procedures / Treatments  Labs (all labs ordered are listed, but only  abnormal results are displayed) Labs Reviewed  COMPREHENSIVE METABOLIC PANEL - Abnormal; Notable for the following components:      Result Value   Chloride 96 (*)    Glucose, Bld 256 (*)    AST 53 (*)    ALT 67 (*)    GFR, Estimated 56 (*)    Anion gap 16 (*)    All other components within normal limits  CBC WITH DIFFERENTIAL/PLATELET - Abnormal; Notable for the following components:   Monocytes Absolute 1.3 (*)    All other components within normal limits  BLOOD GAS, VENOUS - Abnormal; Notable for the following components:   pO2, Ven 70 (*)    Acid-Base Excess 2.2 (*)    All other components within normal limits  BETA-HYDROXYBUTYRIC ACID - Abnormal; Notable for the following components:   Beta-Hydroxybutyric Acid 0.28 (*)    All other components within normal limits  LACTIC ACID, PLASMA - Abnormal; Notable for the following components:   Lactic Acid, Venous 6.9 (*)    All other components within normal limits  LACTIC ACID, PLASMA - Abnormal; Notable for the following components:   Lactic Acid, Venous 6.3 (*)    All other components within normal limits  CREATININE, SERUM - Abnormal; Notable for the following components:   Creatinine, Ser 1.11 (*)    GFR, Estimated 48 (*)    All other components within normal limits  RESP PANEL BY RT-PCR (RSV, FLU A&B, COVID)  RVPGX2  CULTURE, BLOOD (ROUTINE X 2)  CULTURE, BLOOD (ROUTINE X 2)  HEMOGLOBIN A1C  BLOOD GAS, ARTERIAL  PROTIME-INR  APTT  TROPONIN I (HIGH SENSITIVITY)  TROPONIN I (HIGH SENSITIVITY)     EKG     RADIOLOGY I reviewed and interpreted the CXR which does not show any acute cardiopulmonary process    PROCEDURES:  Critical Care performed: Yes, see critical care procedure note(s)  .1-3 Lead EKG Interpretation  Performed by: Rada Hay, MD Authorized by: Rada Hay, MD     Interpretation: abnormal     ECG rate assessment: tachycardic     Rhythm: sinus tachycardia     Ectopy: none      Conduction: normal     The patient is on the cardiac monitor to evaluate for evidence of arrhythmia and/or significant heart rate changes.   MEDICATIONS ORDERED IN ED: Medications  diltiazem (CARDIZEM) injection 15 mg (15 mg Intravenous Given 07/25/22 1403)  hydrALAZINE (APRESOLINE) injection 10 mg (has no administration in time range)  insulin aspart (novoLOG) injection 0-15 Units (has no  administration in time range)  insulin aspart (novoLOG) injection 0-5 Units (has no administration in time range)  cefTRIAXone (ROCEPHIN) 1 g in sodium chloride 0.9 % 100 mL IVPB (0 g Intravenous Stopped 07/25/22 1528)  azithromycin (ZITHROMAX) 500 mg in sodium chloride 0.9 % 250 mL IVPB (500 mg Intravenous New Bag/Given 07/25/22 1532)  methylPREDNISolone sodium succinate (SOLU-MEDROL) 125 mg/2 mL injection 60 mg (60 mg Intravenous Given 07/25/22 1458)  ipratropium-albuterol (DUONEB) 0.5-2.5 (3) MG/3ML nebulizer solution 3 mL (3 mLs Nebulization Given 07/25/22 1450)  enoxaparin (LOVENOX) injection 30 mg (30 mg Subcutaneous Given 07/25/22 1451)  LORazepam (ATIVAN) 2 MG/ML injection (has no administration in time range)  ipratropium-albuterol (DUONEB) 0.5-2.5 (3) MG/3ML nebulizer solution 3 mL (3 mLs Nebulization Given 07/25/22 1044)  ipratropium-albuterol (DUONEB) 0.5-2.5 (3) MG/3ML nebulizer solution 3 mL (3 mLs Nebulization Given 07/25/22 1044)  ipratropium-albuterol (DUONEB) 0.5-2.5 (3) MG/3ML nebulizer solution 3 mL (3 mLs Nebulization Given 07/25/22 1059)  ipratropium-albuterol (DUONEB) 0.5-2.5 (3) MG/3ML nebulizer solution 3 mL (3 mLs Nebulization Given 07/25/22 1108)  ipratropium-albuterol (DUONEB) 0.5-2.5 (3) MG/3ML nebulizer solution 3 mL (3 mLs Nebulization Given 07/25/22 1107)  lactated ringers bolus 1,000 mL (0 mLs Intravenous Stopped 07/25/22 1529)  albuterol (PROVENTIL) (2.5 MG/3ML) 0.083% nebulizer solution 2.5 mg (2.5 mg Nebulization Given 07/25/22 1132)  LORazepam (ATIVAN) injection 0.25 mg  (0.25 mg Intravenous Given 07/25/22 1129)  albuterol (PROVENTIL) (2.5 MG/3ML) 0.083% nebulizer solution 2.5 mg (2.5 mg Nebulization Given 07/25/22 1138)  albuterol (PROVENTIL) (2.5 MG/3ML) 0.083% nebulizer solution 2.5 mg (2.5 mg Nebulization Given 07/25/22 1138)  LORazepam (ATIVAN) injection 0.25 mg (0.25 mg Intravenous Given 07/25/22 1153)  lactated ringers bolus 1,000 mL (0 mLs Intravenous Stopped 07/25/22 1530)  morphine (PF) 2 MG/ML injection 2 mg (2 mg Intravenous Given 07/25/22 1514)     IMPRESSION / MDM / ASSESSMENT AND PLAN / ED COURSE  I reviewed the triage vital signs and the nursing notes.                              Patient's presentation is most consistent with acute presentation with potential threat to life or bodily function.  Differential diagnosis includes, but is not limited to, acute exacerbation of COPD, pneumonia, pneumothorax, pulmonary embolism, CHF, ACS  The patient is an 88 year old female with underlying COPD on 4 L nasal cannula who presents with progressive shortness of breath over the last 4 days.  Has had a cough productive of clear sputum and some mild left upper chest pain.  On arrival to ED patient is significantly working speaking 1-2 word sentences using accessory muscles.  She is currently getting a DuoNeb with EMS.  I called respiratory to have them put her on BiPAP.  She does not have any peripheral edema she is wheezing with decreased air movement.  Has received Solu-Medrol and mag already as well as 2 DuoNebs with EMS at this point.  Will order additional DuoNebs and put on BiPAP.  Will send chest x-ray VBG and labs.  Patient did not tolerate the BiPAP initially so went back on nonrebreather however she looked like she had increased work of breathing so was put back on BiPAP and given 2 doses of 0.25 mg of Ativan which helped her tolerate the mask better.  Heart rate did go up but patient was receiving multiple doses of albuterol.  After a bolus of fluid  and albuterol.  Heart rate is starting to downtrend.  Patient currently on  BiPAP looks much more comfortable.  VBG with no significant hypercarbia.  Rest of her labs overall reassuring.  She is hyperglycemic to 250 no history of diabetes.  Also has an anion gap to 16.  Will send a beta hydroxybutyrate and a lactate.  Suspect that the anion gap is likely lactic acidosis in the setting of her significant beta agonist used.  Low suspicion for DKA at this time.  At this point patient is stable for admission to stepdown.      FINAL CLINICAL IMPRESSION(S) / ED DIAGNOSES   Final diagnoses:  COPD exacerbation ([place])     Rx / DC Orders   ED Discharge Orders     None        Note:  This document was prepared using Dragon voice recognition software and may include unintentional dictation errors.   Rada Hay, MD 07/25/22 1222    Rada Hay, MD 07/25/22 1538

## 2022-07-25 NOTE — Assessment & Plan Note (Signed)
trop negative. In the setting of couhgin/copd exacerbation. Monitoring.

## 2022-07-26 ENCOUNTER — Inpatient Hospital Stay: Payer: Medicare HMO

## 2022-07-26 DIAGNOSIS — J441 Chronic obstructive pulmonary disease with (acute) exacerbation: Secondary | ICD-10-CM

## 2022-07-26 DIAGNOSIS — J208 Acute bronchitis due to other specified organisms: Secondary | ICD-10-CM

## 2022-07-26 DIAGNOSIS — E43 Unspecified severe protein-calorie malnutrition: Secondary | ICD-10-CM

## 2022-07-26 DIAGNOSIS — B9689 Other specified bacterial agents as the cause of diseases classified elsewhere: Secondary | ICD-10-CM | POA: Insufficient documentation

## 2022-07-26 DIAGNOSIS — J9621 Acute and chronic respiratory failure with hypoxia: Secondary | ICD-10-CM

## 2022-07-26 DIAGNOSIS — N179 Acute kidney failure, unspecified: Secondary | ICD-10-CM | POA: Insufficient documentation

## 2022-07-26 DIAGNOSIS — R6520 Severe sepsis without septic shock: Secondary | ICD-10-CM

## 2022-07-26 DIAGNOSIS — A419 Sepsis, unspecified organism: Secondary | ICD-10-CM | POA: Insufficient documentation

## 2022-07-26 LAB — COMPREHENSIVE METABOLIC PANEL
ALT: 40 U/L (ref 0–44)
AST: 24 U/L (ref 15–41)
Albumin: 2.9 g/dL — ABNORMAL LOW (ref 3.5–5.0)
Alkaline Phosphatase: 63 U/L (ref 38–126)
Anion gap: 7 (ref 5–15)
BUN: 20 mg/dL (ref 8–23)
CO2: 29 mmol/L (ref 22–32)
Calcium: 8.5 mg/dL — ABNORMAL LOW (ref 8.9–10.3)
Chloride: 103 mmol/L (ref 98–111)
Creatinine, Ser: 0.79 mg/dL (ref 0.44–1.00)
GFR, Estimated: >60 mL/min (ref >60)
Glucose, Bld: 163 mg/dL — ABNORMAL HIGH (ref 70–99)
Potassium: 3 mmol/L — ABNORMAL LOW (ref 3.5–5.1)
Sodium: 139 mmol/L (ref 135–145)
Total Bilirubin: 0.7 mg/dL (ref 0.3–1.2)
Total Protein: 5.4 g/dL — ABNORMAL LOW (ref 6.5–8.1)

## 2022-07-26 LAB — RESPIRATORY PANEL BY PCR

## 2022-07-26 LAB — CBC WITH DIFFERENTIAL/PLATELET
Abs Immature Granulocytes: 0.02 10*3/uL (ref 0.00–0.07)
Basophils Absolute: 0 10*3/uL (ref 0.0–0.1)
Basophils Relative: 1 %
Eosinophils Absolute: 0 10*3/uL (ref 0.0–0.5)
Eosinophils Relative: 0 %
HCT: 28.8 % — ABNORMAL LOW (ref 36.0–46.0)
Hemoglobin: 9.3 g/dL — ABNORMAL LOW (ref 12.0–15.0)
Immature Granulocytes: 1 %
Lymphocytes Relative: 5 %
Lymphs Abs: 0.2 10*3/uL — ABNORMAL LOW (ref 0.7–4.0)
MCH: 31.1 pg (ref 26.0–34.0)
MCHC: 32.3 g/dL (ref 30.0–36.0)
MCV: 96.3 fL (ref 80.0–100.0)
Monocytes Absolute: 0.4 10*3/uL (ref 0.1–1.0)
Monocytes Relative: 12 %
Neutro Abs: 2.6 10*3/uL (ref 1.7–7.7)
Neutrophils Relative %: 81 %
Platelets: 167 10*3/uL (ref 150–400)
RBC: 2.99 MIL/uL — ABNORMAL LOW (ref 3.87–5.11)
RDW: 12.6 % (ref 11.5–15.5)
Smear Review: NORMAL
WBC: 3.1 10*3/uL — ABNORMAL LOW (ref 4.0–10.5)
nRBC: 0 % (ref 0.0–0.2)

## 2022-07-26 LAB — HEMOGLOBIN A1C
Hgb A1c MFr Bld: 5.6 % (ref 4.8–5.6)
Mean Plasma Glucose: 114 mg/dL

## 2022-07-26 LAB — GLUCOSE, CAPILLARY: Glucose-Capillary: 146 mg/dL — ABNORMAL HIGH (ref 70–99)

## 2022-07-26 LAB — CBG MONITORING, ED
Glucose-Capillary: 257 mg/dL — ABNORMAL HIGH (ref 70–99)
Glucose-Capillary: 162 mg/dL — ABNORMAL HIGH (ref 70–99)
Glucose-Capillary: 162 mg/dL — ABNORMAL HIGH (ref 70–99)
Glucose-Capillary: 115 mg/dL — ABNORMAL HIGH (ref 70–99)

## 2022-07-26 LAB — PROCALCITONIN: Procalcitonin: 4.38 ng/mL

## 2022-07-26 LAB — MAGNESIUM: Magnesium: 2.1 mg/dL (ref 1.7–2.4)

## 2022-07-26 LAB — LACTIC ACID, PLASMA
Lactic Acid, Venous: 2.3 mmol/L (ref 0.5–1.9)
Lactic Acid, Venous: 1.5 mmol/L (ref 0.5–1.9)

## 2022-07-26 MED ORDER — POTASSIUM CHLORIDE CRYS ER 20 MEQ PO TBCR
40.0000 meq | EXTENDED_RELEASE_TABLET | ORAL | Status: AC
  Administered 2022-07-26: 40 meq via ORAL
  Filled 2022-07-26: qty 2

## 2022-07-26 MED ORDER — GLUCERNA SHAKE PO LIQD
237.0000 mL | Freq: Two times a day (BID) | ORAL | Status: DC
  Administered 2022-07-26 – 2022-07-27 (×2): 237 mL via ORAL

## 2022-07-26 NOTE — Hospital Course (Signed)
Misty Ferguson is a 88 y.o. female with medical history significant of COPD, chronic hypoxemia chronically on 4 L oxygen, essential hypertension, who presents to the hospital with worsening shortness of breath. In the emergency room, patient had a severe respite distress, initially placed on 10 L oxygen and changed to BiPAP.Her vital signs are temperature 98.1, heart rate 137, respirate 33, blood pressure 124/84. Lab results showed leukopenia of 3.1, hemoglobin 9.3, lactic acid 6.9.  Chest x-ray did not show any acute process with evidence of COPD.  Patient was given 2 L of fluid bolus, patient was started on Rocephin and Zithromax.

## 2022-07-26 NOTE — Progress Notes (Addendum)
Progress Note   Patient: Misty Ferguson:[id number] DOB: Sep 10, 1933 DOA: 07/25/2022     1 DOS: the patient was seen and examined on 07/26/2022   Brief hospital course: Misty Ferguson is a 88 y.o. female with medical history significant of COPD, chronic hypoxemia chronically on 4 L oxygen, essential hypertension, who presents to the hospital with worsening shortness of breath. In the emergency room, patient had a severe respite distress, initially placed on 10 L oxygen and changed to BiPAP.Her vital signs are temperature 98.1, heart rate 137, respirate 33, blood pressure 124/84. Lab results showed leukopenia of 3.1, hemoglobin 9.3, lactic acid 6.9.  Chest x-ray did not show any acute process with evidence of COPD.  Patient was given 2 L of fluid bolus, patient was started on Rocephin and Zithromax.  Assessment and Plan: Acute on chronic respiratory failure with hypoxemia secondary to COPD exacerbation. COPD exacerbation. Patient initially had an increased oxygen requirement and a severe respite distress.  Was placed on BiPAP.  She seems to be better this morning. She is placed on antibiotics and steroids.  Will continue that. ED has ordered a CT angiogram to rule out PE, patient refused.  Given current infection, the probability of PE is low.  Severe sepsis. Acute kidney injury secondary to sepsis. Acute bacterial bronchitis. Patient met sepsis criteria at time of admission with secondary tachycardia, bandemia, tachypnea, patient also had severe lactic acidosis and acute kidney injury.  She meet severe sepsis criteria. Source of infection appears to be bacterial bronchitis as patient procalcitonin level was significantly elevated.  Initial chest x-ray was clear with COPD changes, will repeat chest x-ray to rule out pneumonia as patient has received fluids. Blood cultures so far has no growth. Continue antibiotics with Rocephin and Zithromax. Patient has received IV fluid bolus, I we will  recheck lactic acid level to make sure it comes down.  Hypokalemia. Repleted.  Recheck level tomorrow.  Magnesium level normal.  Severe protein calorie malnutrition. Patient significant muscle atrophy, BMI 17.57.   Hyperglycemia.   Most likely due to steroids and severe sepsis.  Continue sliding scale insulin.  Essential hypertension. Patient has a severe elevation blood pressure due to severe sepsis.  Blood pressure is better, continue diltiazem.     Subjective:  Patient feels better today, wish to go home today. Still has cough, nonproductive No fever or chills. No chest pain.  Physical Exam: Vitals:   07/26/22 0500 07/26/22 0600 07/26/22 0700 07/26/22 1215  BP: 138/62 139/63 138/63 (!) 141/65  Pulse: 94 94 95 (!) 105  Resp: (!) 26 (!) 30 (!) 24 (!) 26  Temp:    97.8 F (36.6 C)  TempSrc:    Oral  SpO2: 99% 99% 97% 96%  Weight:      Height:       General exam: Appears calm and comfortable, malnurished Respiratory system: Significant decreased breathing sounds. Respiratory effort normal. Cardiovascular system: S1 & S2 heard, RRR. No JVD, murmurs, rubs, gallops or clicks. No pedal edema. Gastrointestinal system: Abdomen is nondistended, soft and nontender. No organomegaly or masses felt. Normal bowel sounds heard. Central nervous system: Alert and oriented x3. No focal neurological deficits. Extremities: Muscle atrophy in legs present. Skin: No rashes, lesions or ulcers Psychiatry: Judgement and insight appear normal. Mood & affect appropriate.   Data Reviewed:  Reviewed chest x-ray results, chest x-ray imaging.  Also reviewed lab results.  Family Communication: Husband updated at bedside.  Disposition: Status is: Inpatient Remains inpatient appropriate because:  Severity of disease, IV treatment.  Planned Discharge Destination: Home with Home Health    Time spent: 55 minutes  Author: Sharen Hones, MD 07/26/2022 1:31 PM  For on call review  [email or web address].

## 2022-07-26 NOTE — ED Notes (Signed)
Patients spouse at the bedside. No distress noted. O2 4L via [place]. Will continue to monitor.

## 2022-07-26 NOTE — ED Notes (Signed)
Dr. Roosevelt Locks also informed that her hemoglobin dropped from 12.8 to 9.3 from yesterday and today.

## 2022-07-26 NOTE — ED Notes (Signed)
Pt refused CT scan "I don't need it, I'm 88 years old and if they find anything on the cat scan I don't want any intervention anyways". MD at bedside aware. CT tech informed as well.

## 2022-07-26 NOTE — ED Notes (Signed)
Dr. Roosevelt Locks notified of pts potassium of 3.0. Pt is on the cardiac monitor.

## 2022-07-27 DIAGNOSIS — J189 Pneumonia, unspecified organism: Secondary | ICD-10-CM

## 2022-07-27 LAB — BASIC METABOLIC PANEL
Anion gap: 7 (ref 5–15)
BUN: 27 mg/dL — ABNORMAL HIGH (ref 8–23)
CO2: 28 mmol/L (ref 22–32)
Calcium: 8.5 mg/dL — ABNORMAL LOW (ref 8.9–10.3)
Chloride: 104 mmol/L (ref 98–111)
Creatinine, Ser: 0.76 mg/dL (ref 0.44–1.00)
GFR, Estimated: >60 mL/min (ref >60)
Glucose, Bld: 146 mg/dL — ABNORMAL HIGH (ref 70–99)
Potassium: 3.9 mmol/L (ref 3.5–5.1)
Sodium: 139 mmol/L (ref 135–145)

## 2022-07-27 LAB — GLUCOSE, CAPILLARY
Glucose-Capillary: 137 mg/dL — ABNORMAL HIGH (ref 70–99)
Glucose-Capillary: 140 mg/dL — ABNORMAL HIGH (ref 70–99)

## 2022-07-27 MED ORDER — CEFDINIR 300 MG PO CAPS: 300.0000 mg | ORAL_CAPSULE | Freq: Two times a day (BID) | ORAL | 0 refills | Status: AC

## 2022-07-27 MED ORDER — AZITHROMYCIN 500 MG PO TABS: 500.0000 mg | ORAL_TABLET | Freq: Every day | ORAL | 0 refills | Status: AC

## 2022-07-27 MED ORDER — PREDNISONE 20 MG PO TABS: ORAL_TABLET | ORAL | 0 refills | Status: DC

## 2022-07-27 MED ORDER — ALBUTEROL SULFATE HFA 108 (90 BASE) MCG/ACT IN AERS: 2.0000 | INHALATION_SPRAY | Freq: Four times a day (QID) | RESPIRATORY_TRACT | 0 refills | Status: DC | PRN

## 2022-07-27 MED ORDER — PREDNISONE 20 MG PO TABS
40.0000 mg | ORAL_TABLET | Freq: Every day | ORAL | Status: DC
  Administered 2022-07-27: 40 mg via ORAL
  Filled 2022-07-27: qty 2

## 2022-07-27 NOTE — TOC Initial Note (Signed)
Transition of Care [facility]) - Initial/Assessment Note    Patient Details  Name: Misty Ferguson MRN: [id number] Date of Birth: 11/17/1933  Transition of [facility]) CM/SW Contact:    Magnus Ivan, LCSW Phone Number: 07/27/2022, 12:42 PM  Clinical Narrative:                 Patient to DC home today. CSW spoke to spouse who is at bedside.  Patient live with spouse. Has all needed DME per spouse, MD, and PT. PCP is Dr. [name].  Patient had [facility] in the past but cannot remember agency. Patient and spouse are agreeable to [facility], no agency preference. Confirmed home address. [place] referral made to [facility] with Alvis Lemmings who is aware of DC home today.   Expected Discharge Plan: [place] Barriers to Discharge: Barriers Resolved   Patient Goals and CMS Choice Patient states their goals for this hospitalization and ongoing recovery are:: home with home health CMS Medicare.gov Compare Post Acute Care list provided to:: Patient Choice offered to / list presented to : Patient  Expected Discharge Plan and Services Expected Discharge Plan: [place]       Living arrangements for the past 2 months: Single Family Home Expected Discharge Date: 07/27/22                         HH Arranged: PT, RN [place] Agency: [place] Date [facility] Agency Contacted: 07/27/22   Representative spoke with at [place]: Tommi Rumps  Prior Living Arrangements/Services Living arrangements for the past 2 months: [place] Lives with:: Spouse Patient language and need for interpreter reviewed:: Yes Do you feel safe going back to the place where you live?: Yes      Need for Family Participation in Patient Care: Yes (Comment) Care giver support system in place?: Yes (comment) Current home services: DME Criminal Activity/Legal Involvement Pertinent to Current Situation/Hospitalization: No - Comment as needed  Activities of Daily Living      Permission  Sought/Granted Permission sought to share information with : Facility Art therapist granted to share information with : Yes, Verbal Permission Granted     Permission granted to share info w AGENCY: [facility]        Emotional Assessment       Orientation: : Oriented to Self, Oriented to Situation, Oriented to Place, Oriented to  Time Alcohol / Substance Use: Not Applicable Psych Involvement: No (comment)  Admission diagnosis:  COPD exacerbation ([place]) [J44.1] Patient Active Problem List   Diagnosis Date Noted   Severe sepsis ([place]) 07/26/2022   Acute bacterial bronchitis 07/26/2022   Protein-calorie malnutrition, severe ([place]) 07/26/2022   AKI (acute kidney injury) ([place]) 07/26/2022   Hyperglycemia 07/25/2022   Transaminitis 07/25/2022   Chest pain 07/25/2022   Lactic acidosis 07/25/2022   Bandemia 07/25/2022   Acute on chronic respiratory failure with hypoxia ([place]) 07/25/2022   COPD exacerbation ([place]) 07/25/2022   Serous bulla of skin 05/29/2022   Wheezing 01/23/2022   Acute cough 01/23/2022   Bronchitis 01/23/2022   PVD (peripheral vascular disease) ([place]) 12/14/2021   Decreased pulses in feet 11/06/2021   Skin lesions 11/08/2020   Goals of care, counseling/discussion 06/14/2019   History of bladder cancer 06/08/2019   Constipation 04/25/2019   Tachycardia 10/24/2018   Senile purpura ([place]) 10/04/2016   Peripheral neuropathy 07/24/2016   Chronic hypoxemic respiratory failure ([place]) 08/25/2012   COPD with acute exacerbation (  [place]) 07/03/2012   Pneumonia of right upper lobe due to infectious organism 08/25/2010   Hyperkalemia 05/04/2009   HYPERCHOLESTEROLEMIA 03/24/2008   Prediabetes 12/11/2006   MACULAR DEGENERATION 12/10/2006   Essential hypertension, benign 12/10/2006   Centrilobular emphysema ([place]) 12/10/2006   Osteoporosis 12/10/2006   PCP:  [name], MD Pharmacy[facility]   [id number] [place]Phone: [phone]: [phone] [facility] [place] [address]Phone: [phone]: [phone]    Social Determinants of Health (SDOH) Interventions    Readmission Risk Interventions     No data to display

## 2022-07-27 NOTE — Progress Notes (Signed)
Brief Nutrition Note  RD received consult for assessment of nutritional requirements/ status.   Attempted to speak with pt, however, in with MD at time of visit. When attempted visit later, noted pt will be discharged home, which was confirmed with MD and TOC.  No nutrition interventions warranted at this time. If nutrition issues arise, please consult RD.   Loistine Chance, RD, LDN, [place] Registered Dietitian II Certified Diabetes Care and Education Specialist Please refer to [facility] for RD and/or RD on-call/weekend/after hours pager

## 2022-07-27 NOTE — Progress Notes (Signed)
Misty Ferguson to be D/C'd home with home health per MD order. Discussed with the patient and husband and all questions fully answered.  Skin clean and dry with bruising. IV catheter discontinued intact. Site without signs and symptoms of complications. Dressing and pressure applied.  An After Visit Summary was printed and given to the patient.  Patient escorted via [place], and D/C home via private auto with home oxygen.  Melonie [place]  07/27/2022 3:57 PM

## 2022-07-27 NOTE — Care Management Important Message (Signed)
Important Message  Patient Details  Name: Misty Ferguson MRN: [id number] Date of Birth: [birth year]   Medicare Important Message Given:  Yes     Dannette Barbara 07/27/2022, 11:56 AM

## 2022-07-27 NOTE — Evaluation (Signed)
Physical Therapy Evaluation Patient Details Name: Misty Ferguson MRN: [id number] DOB: 20-Nov-1933 Today's Date: 07/27/2022  History of Present Illness  Pt is an 88 y.o. female with medical history significant of macular degeneration and COPD presenting with increased SOB.  MD assessement includes acute on chronic respiratory failure with hypoxemia secondary to COPD exacerbation, severe sepsis, AKI, bacterial bronchitis, and hypokalemia.   Clinical Impression  Pt was pleasant and motivated to participate during the session and put forth good effort throughout. Pt required no physical assistance during the session and was generally steady with transfers and gait with a RW.  Pt ambulated 50 feet with RPE of 4-5/10, HR low 100s, and with SpO2 in mid 90s.  Pt at risk for further functional decline with extensive education provided on dyspnea spiral and principles of activity progression with use of RPE.  Pt will benefit from HHPT upon discharge to safely address deficits listed in patient problem list for decreased caregiver assistance and eventual return to PLOF.         Recommendations for follow up therapy are one component of a multi-disciplinary discharge planning process, led by the attending physician.  Recommendations may be updated based on patient status, additional functional criteria and insurance authorization.  Follow Up Recommendations Home health PT      Assistance Recommended at Discharge Intermittent Supervision/Assistance  Patient can return home with the following  A little help with walking and/or transfers;A little help with bathing/dressing/bathroom;Assistance with cooking/housework;Help with stairs or ramp for entrance;Assist for transportation    Equipment Recommendations None recommended by PT  Recommendations for Other Services       Functional Status Assessment Patient has had a recent decline in their functional status and demonstrates the ability to make  significant improvements in function in a reasonable and predictable amount of time.     Precautions / Restrictions Precautions Precautions: Fall Restrictions Weight Bearing Restrictions: No      Mobility  Bed Mobility Overal bed mobility: Modified Independent             General bed mobility comments: Min extra time and effort and use of bed rails    Transfers Overall transfer level: Needs assistance Equipment used: Rolling walker (2 wheels) Transfers: Sit to/from Stand Sit to Stand: Supervision           General transfer comment: Good eccentric and concentric control and stability    Ambulation/Gait Ambulation/Gait assistance: Supervision Gait Distance (Feet): 50 Feet Assistive device: Rolling walker (2 wheels) Gait Pattern/deviations: Step-through pattern, Decreased step length - right, Decreased step length - left Gait velocity: decreased     General Gait Details: Slow cadence with light lean on the RW for support with no overt LOB  Stairs            Wheelchair Mobility    Modified Rankin (Stroke Patients Only)       Balance Overall balance assessment: Needs assistance Sitting-balance support: Feet supported, Single extremity supported Sitting balance-Leahy Scale: Good     Standing balance support: Bilateral upper extremity supported, During functional activity Standing balance-Leahy Scale: Good                               Pertinent Vitals/Pain Pain Assessment Pain Assessment: No/denies pain    Home Living Family/patient expects to be discharged to:: Private residence Living Arrangements: Spouse/significant other Available Help at Discharge: Family;Available 24 hours/day Type of Home: House Home Access: Stairs  to enter Entrance Stairs-Rails: None Entrance Stairs-Number of Steps: 1   Home Layout: One level Home Equipment: Conservation officer, nature (2 wheels);Cane - single point;BSC/3in1;Transport chair      Prior Function  Prior Level of Function : Needs assist       Physical Assist : ADLs (physical)   ADLs (physical): Bathing;Dressing Mobility Comments: Ind amb without an AD household distances, no falls in the last 6 months, uses her transport chair for community access ADLs Comments: Spouse assists with ADLs     Hand Dominance   Dominant Hand: Right    Extremity/Trunk Assessment   Upper Extremity Assessment Upper Extremity Assessment: Overall WFL for tasks assessed    Lower Extremity Assessment Lower Extremity Assessment: Generalized weakness       Communication   Communication: No difficulties  Cognition Arousal/Alertness: Awake/alert Behavior During Therapy: WFL for tasks assessed/performed Overall Cognitive Status: Within Functional Limits for tasks assessed                                          General Comments      Exercises Other Exercises Other Exercises: Pt and spouse education on dyspnea spiral, physiological benefits of activity, and general principles of activity progression utilizing RPE   Assessment/Plan    PT Assessment Patient needs continued PT services  PT Problem List Decreased strength;Decreased activity tolerance;Decreased balance;Decreased knowledge of use of DME       PT Treatment Interventions DME instruction;Gait training;Stair training;Functional mobility training;Therapeutic activities;Therapeutic exercise;Balance training;Patient/family education    PT Goals (Current goals can be found in the Care Plan section)  Acute Rehab PT Goals Patient Stated Goal: To get stronger PT Goal Formulation: With patient Time For Goal Achievement: 08/09/22 Potential to Achieve Goals: Good    Frequency Min 2X/week     Co-evaluation               AM-PAC PT "6 Clicks" Mobility  Outcome Measure Help needed turning from your back to your side while in a flat bed without using bedrails?: A Little Help needed moving from lying on your back  to sitting on the side of a flat bed without using bedrails?: A Little Help needed moving to and from a bed to a chair (including a wheelchair)?: A Little Help needed standing up from a chair using your arms (e.g., wheelchair or bedside chair)?: A Little Help needed to walk in hospital room?: A Little Help needed climbing 3-5 steps with a railing? : A Little 6 Click Score: 18    End of Session Equipment Utilized During Treatment: Gait belt;Oxygen Activity Tolerance: Patient tolerated treatment well Patient left: in chair;with call bell/phone within reach;with chair alarm set;with family/visitor present;with nursing/sitter in room Nurse Communication: Mobility status PT Visit Diagnosis: Muscle weakness (generalized) (M62.81);Difficulty in walking, not elsewhere classified (R26.2)    Time: 0017-4944 PT Time Calculation (min) (ACUTE ONLY): 50 min   Charges:   PT Evaluation $PT Eval Moderate Complexity: 1 Mod PT Treatments $Therapeutic Activity: 8-22 mins       D. Scott Andrei Mccook PT, DPT 07/27/22, 11:45 AM

## 2022-07-27 NOTE — Discharge Summary (Signed)
Physician Discharge Summary   Patient: Misty Ferguson MRN: [id number] DOB: 02/03/1934  Admit date:     07/25/2022  Discharge date: 07/27/22  Discharge Physician: Sharen Hones   PCP: [name], MD   Recommendations at discharge:   Follow-up with PCP in 1 week.  Discharge Diagnoses: Principal Problem:   COPD with acute exacerbation ([place]) Active Problems:   Essential hypertension, benign   Pneumonia of right upper lobe due to infectious organism   Chronic hypoxemic respiratory failure (HCC)   Tachycardia   Hyperglycemia   Transaminitis   Chest pain   Lactic acidosis   Bandemia   Acute on chronic respiratory failure with hypoxia (HCC)   COPD exacerbation (HCC)   Severe sepsis (HCC)   Acute bacterial bronchitis   Protein-calorie malnutrition, severe (HCC)   AKI (acute kidney injury) ([place])  Resolved Problems:   * No resolved hospital problems. *  Hospital Course: Misty Ferguson is a 88 y.o. female with medical history significant of COPD, chronic hypoxemia chronically on 4 L oxygen, essential hypertension, who presents to the hospital with worsening shortness of breath. In the emergency room, patient had a severe respite distress, initially placed on 10 L oxygen and changed to BiPAP.Her vital signs are temperature 98.1, heart rate 137, respirate 33, blood pressure 124/84. Lab results showed leukopenia of 3.1, hemoglobin 9.3, lactic acid 6.9.  Chest x-ray did not show any acute process with evidence of COPD.  Patient was given 2 L of fluid bolus, patient was started on Rocephin and Zithromax.  Assessment and Plan:  Acute on chronic respiratory failure with hypoxemia secondary to COPD exacerbation. COPD exacerbation. Patient initially had an increased oxygen requirement and a severe respiratory distress.  Was placed on BiPAP.   She is placed on antibiotics and steroids.   ED has ordered a CT angiogram to rule out PE, patient refused.  Given current infection, the  probability of PE is low. Condition quickly improved after treatment, patient has been back to 4 L oxygen which is part baseline since yesterday.  She is adamant she will be discharged today.  This point she is medically stable to do so.  Will continue steroid taper and antibiotics.   Severe sepsis. Acute kidney injury secondary to sepsis. Acute bacterial bronchitis. Right upper lobe bacterial pneumonia, ruled in, POA. Patient met sepsis criteria at time of admission with secondary tachycardia, bandemia, tachypnea, patient also had severe lactic acidosis and acute kidney injury.  She meet severe sepsis criteria. Source of infection appears to be respiratory as patient procalcitonin level was significantly elevated.  Initial chest x-ray was clear with COPD changes, will repeat chest x-ray after IV fluids showed right upper lobe infiltrates, confirmed pneumonia. Blood cultures so far has no growth.  Respiratory panel was also negative. She is treated with antibiotics with Rocephin and Zithromax. Condition has improved, lactic acidosis improved.  Currently she feels much better, she is medically stable to be discharged.  Will continue to finish 5 days of antibiotics with oral antibiotics.   Hypokalemia. Resolved.   Severe protein calorie malnutrition. Patient significant muscle atrophy, BMI 17.57.     Hyperglycemia.   Most likely due to steroids and severe sepsis.  Improved.   Essential hypertension with hypertension urgency Patient has a severe elevation blood pressure due to severe sepsis.  Blood pressure is better, home medicines reviewed.      Consultants: None Procedures performed: None  Disposition: Home health Diet recommendation:  Discharge Diet Orders (From admission,  onward)     Start     Ordered   07/27/22 0000  Diet - low sodium heart healthy        07/27/22 1015           Cardiac diet DISCHARGE MEDICATION: Allergies as of 07/27/2022   No Known Allergies       Medication List     STOP taking these medications    gabapentin 300 MG capsule Commonly known as: NEURONTIN   hydrochlorothiazide 12.5 MG capsule Commonly known as: MICROZIDE       TAKE these medications    albuterol 108 (90 Base) MCG/ACT inhaler Commonly known as: VENTOLIN HFA Inhale 2 puffs into the lungs every 6 (six) hours as needed for wheezing or shortness of breath.   ascorbic acid 1000 MG tablet Commonly known as: VITAMIN C Take 1,000 mg by mouth daily.   aspirin EC 81 MG tablet Take 1 tablet (81 mg total) by mouth daily.   atorvastatin 10 MG tablet Commonly known as: LIPITOR TAKE 1 TABLET EVERY DAY   azithromycin 500 MG tablet Commonly known as: Zithromax Take 1 tablet (500 mg total) by mouth daily for 2 days.   budesonide-formoterol 160-4.5 MCG/ACT inhaler Commonly known as: Symbicort Inhale 2 puffs into the lungs in the morning and at bedtime.   cefdinir 300 MG capsule Commonly known as: OMNICEF Take 1 capsule (300 mg total) by mouth 2 (two) times daily for 3 days.   clopidogrel 75 MG tablet Commonly known as: PLAVIX TAKE 1 TABLET EVERY DAY   docusate sodium 100 MG capsule Commonly known as: COLACE Take 100 mg by mouth 2 (two) times daily.   Fish Oil 1000 MG Caps Take 1,000 mg by mouth 2 (two) times daily.   losartan 25 MG tablet Commonly known as: COZAAR 25 mg daily.   OXYGEN Inhale 4 L into the lungs as needed.   predniSONE 20 MG tablet Commonly known as: DELTASONE Take 2 tablets (40 mg total) by mouth daily with breakfast for 2 days, THEN 1 tablet (20 mg total) daily with breakfast for 2 days, THEN 0.5 tablets (10 mg total) daily with breakfast for 2 days. Start taking on: July 28, 2022   PreserVision AREDS Tabs Take 1 tablet by mouth 2 (two) times daily.   verapamil 360 MG 24 hr capsule Commonly known as: VERELAN PM Take 360 mg by mouth daily.   Vitamin D 125 MCG (5000 UT) Caps Take 5,000 Units by mouth daily.   Vitamin  E 180 MG Caps Take 180 mg by mouth daily.        Follow-up Information     [name], Amy E, MD Follow up in 1 week(s).   Specialty: Family Medicine Contact information: [place] [address] [phone]                Discharge Exam: Danley Danker Weights   07/25/22 1114  Weight: 42.2 kg   General exam: Appears calm and comfortable  Respiratory system: Decreased breath sounds. Respiratory effort normal. Cardiovascular system: S1 & S2 heard, RRR. No JVD, murmurs, rubs, gallops or clicks. No pedal edema. Gastrointestinal system: Abdomen is nondistended, soft and nontender. No organomegaly or masses felt. Normal bowel sounds heard. Central nervous system: Alert and oriented. No focal neurological deficits. Extremities: Symmetric 5 x 5 power. Skin: No rashes, lesions or ulcers Psychiatry: Judgement and insight appear normal. Mood & affect appropriate.    Condition at discharge: good  The results of  significant diagnostics from this hospitalization (including imaging, microbiology, ancillary and laboratory) are listed below for reference.   Imaging Studies: DG Chest 2 View  Result Date: 07/26/2022 CLINICAL DATA:  COPD.  Chest tightness for 4 days EXAM: CHEST - 2 VIEW COMPARISON:  Yesterday FINDINGS: Left proximal humerus 1.0 cm sclerotic lesion is likely an enchondroma. Remote posterolateral lower left rib fractures. Midline trachea. Normal heart size. Atherosclerosis in the transverse aorta. No pleural effusion or pneumothorax. Mild hyperinflation and moderate lower lung predominant central airway thickening. Lateral right upper lobe subtle opacity is not readily apparent on the prior. Similar medial left lower lobe scarring. IMPRESSION: COPD/chronic bronchitis. Subtle increased density projecting over the inferolateral right upper lobe for which developing airspace disease is possible. Depending on symptomatology, consider plain film follow-up in 3-5 days.  Electronically Signed   By: Abigail Miyamoto M.D.   On: 07/26/2022 14:01   DG Chest Portable 1 View  Result Date: 07/25/2022 CLINICAL DATA:  Shortness of breath. EXAM: PORTABLE CHEST 1 VIEW COMPARISON:  Chest x-ray dated January 23, 2022. FINDINGS: The heart size and mediastinal contours are within normal limits. Normal pulmonary vascularity. The lungs remain hyperinflated with emphysematous changes and coarsened interstitial markings. No focal consolidation, pleural effusion, or pneumothorax. No acute osseous abnormality. IMPRESSION: 1. No active disease. COPD. Electronically Signed   By: Titus Dubin M.D.   On: 07/25/2022 10:59    Microbiology: Results for orders placed or performed during the hospital encounter of 07/25/22  Resp panel by RT-PCR (RSV, Flu A&B, Covid) Anterior Nasal Swab     Status: None   Collection Time: 07/25/22 10:32 AM   Specimen: Anterior Nasal Swab  Result Value Ref Range Status   SARS Coronavirus 2 by RT PCR NEGATIVE NEGATIVE Final    Comment: (NOTE) SARS-CoV-2 target nucleic acids are NOT DETECTED.  The SARS-CoV-2 RNA is generally detectable in upper respiratory specimens during the acute phase of infection. The lowest concentration of SARS-CoV-2 viral copies this assay can detect is 138 copies/mL. A negative result does not preclude SARS-Cov-2 infection and should not be used as the sole basis for treatment or other patient management decisions. A negative result may occur with  improper specimen collection/handling, submission of specimen other than nasopharyngeal swab, presence of viral mutation(s) within the areas targeted by this assay, and inadequate number of viral copies(<138 copies/mL). A negative result must be combined with clinical observations, patient history, and epidemiological information. The expected result is Negative.  Fact Sheet for Patients:  EntrepreneurPulse.com.au  Fact Sheet for Healthcare Providers:   IncredibleEmployment.be  This test is no t yet approved or cleared by the [place] FDA and  has been authorized for detection and/or diagnosis of SARS-CoV-2 by FDA under an Emergency Use Authorization (EUA). This EUA will remain  in effect (meaning this test can be used) for the duration of the COVID-19 declaration under Section 564(b)(1) of the Act, 21 U.S.C.section 360bbb-3(b)(1), unless the authorization is terminated  or revoked sooner.       Influenza A by PCR NEGATIVE NEGATIVE Final   Influenza B by PCR NEGATIVE NEGATIVE Final    Comment: (NOTE) The Xpert Xpress SARS-CoV-2/FLU/RSV plus assay is intended as an aid in the diagnosis of influenza from Nasopharyngeal swab specimens and should not be used as a sole basis for treatment. Nasal washings and aspirates are unacceptable for Xpert Xpress SARS-CoV-2/FLU/RSV testing.  Fact Sheet for Patients: EntrepreneurPulse.com.au  Fact Sheet for Healthcare Providers: IncredibleEmployment.be  This test is not yet  approved or cleared by the [place] and has been authorized for detection and/or diagnosis of SARS-CoV-2 by FDA under an Emergency Use Authorization (EUA). This EUA will remain in effect (meaning this test can be used) for the duration of the COVID-19 declaration under Section 564(b)(1) of the Act, 21 U.S.C. section 360bbb-3(b)(1), unless the authorization is terminated or revoked.     Resp Syncytial Virus by PCR NEGATIVE NEGATIVE Final    Comment: (NOTE) Fact Sheet for Patients: EntrepreneurPulse.com.au  Fact Sheet for Healthcare Providers: IncredibleEmployment.be  This test is not yet approved or cleared by the [place] FDA and has been authorized for detection and/or diagnosis of SARS-CoV-2 by FDA under an Emergency Use Authorization (EUA). This EUA will remain in effect (meaning this test can be used) for  the duration of the COVID-19 declaration under Section 564(b)(1) of the Act, 21 U.S.C. section 360bbb-3(b)(1), unless the authorization is terminated or revoked.  Performed at [facility], [address] [place], [place] [address]   Culture, blood (Routine X 2) Call MD if unable to obtain prior to antibiotics being given     Status: None (Preliminary result)   Collection Time: 07/25/22  2:29 PM   Specimen: BLOOD  Result Value Ref Range Status   Specimen Description BLOOD BLOOD LEFT FOREARM  Final   Special Requests   Final    BOTTLES DRAWN AEROBIC AND ANAEROBIC Blood Culture results may not be optimal due to an inadequate volume of blood received in culture bottles   Culture   Final    NO GROWTH 2 DAYS Performed at [facility], [address], [place], [place] [address]    Report Status PENDING  Incomplete  Culture, blood (Routine X 2) Call MD if unable to obtain prior to antibiotics being given     Status: None (Preliminary result)   Collection Time: 07/25/22  2:29 PM   Specimen: BLOOD  Result Value Ref Range Status   Specimen Description BLOOD BLOOD RIGHT FOREARM  Final   Special Requests   Final    BOTTLES DRAWN AEROBIC AND ANAEROBIC Blood Culture adequate volume   Culture   Final    NO GROWTH 2 DAYS Performed at [facility], [address] [place],  [address]    Report Status PENDING  Incomplete  Respiratory (~20 pathogens) panel by PCR     Status: None   Collection Time: 07/26/22 12:45 PM   Specimen: Nasopharyngeal Swab; Respiratory  Result Value Ref Range Status   Adenovirus NOT DETECTED NOT DETECTED Final   Coronavirus 229E NOT DETECTED NOT DETECTED Final    Comment: (NOTE) The Coronavirus on the Respiratory Panel, DOES NOT test for the novel  Coronavirus (2019 nCoV)    Coronavirus HKU1 NOT DETECTED NOT DETECTED Final   Coronavirus NL63 NOT DETECTED NOT DETECTED Final   Coronavirus OC43 NOT DETECTED NOT DETECTED Final    Metapneumovirus NOT DETECTED NOT DETECTED Final   Rhinovirus / Enterovirus NOT DETECTED NOT DETECTED Final   Influenza A NOT DETECTED NOT DETECTED Final   Influenza B NOT DETECTED NOT DETECTED Final   Parainfluenza Virus 1 NOT DETECTED NOT DETECTED Final   Parainfluenza Virus 2 NOT DETECTED NOT DETECTED Final   Parainfluenza Virus 3 NOT DETECTED NOT DETECTED Final   Parainfluenza Virus 4 NOT DETECTED NOT DETECTED Final   Respiratory Syncytial Virus NOT DETECTED NOT DETECTED Final   Bordetella pertussis NOT DETECTED NOT DETECTED Final   Bordetella Parapertussis NOT DETECTED NOT DETECTED  Final   Chlamydophila pneumoniae NOT DETECTED NOT DETECTED Final   Mycoplasma pneumoniae NOT DETECTED NOT DETECTED Final    Comment: Performed at [facility], [place] [address] [place], [place] [address]    Labs: CBC: Recent Labs  Lab 07/25/22 1032 07/26/22 0448  WBC 9.7 3.1*  NEUTROABS 6.9 2.6  HGB 12.8 9.3*  HCT 39.7 28.8*  MCV 95.7 96.3  PLT 287 606   Basic Metabolic Panel: Recent Labs  Lab 07/25/22 1032 07/25/22 1429 07/26/22 0448 07/27/22 0532  NA 137  --  139 139  K 3.5  --  3.0* 3.9  CL 96*  --  103 104  CO2 25  --  29 28  GLUCOSE 256*  --  163* 146*  BUN 21  --  20 27*  CREATININE 0.98 1.11* 0.79 0.76  CALCIUM 9.5  --  8.5* 8.5*  MG  --   --  2.1  --    Liver Function Tests: Recent Labs  Lab 07/25/22 1032 07/26/22 0448  AST 53* 24  ALT 67* 40  ALKPHOS 93 63  BILITOT 1.1 0.7  PROT 7.4 5.4*  ALBUMIN 4.0 2.9*   CBG: Recent Labs  Lab 07/26/22 1007 07/26/22 1207 07/26/22 1625 07/26/22 2241 07/27/22 0740  GLUCAP 257* 162*  162* 115* 146* 137*    Discharge time spent: greater than 30 minutes.  Signed: Sharen Hones, MD [facility] 07/27/2022

## 2022-07-29 LAB — HEPATITIS PANEL, ACUTE
HCV Ab: NONREACTIVE
Hep A IgM: NONREACTIVE
Hep B C IgM: NONREACTIVE
Hepatitis B Surface Ag: NONREACTIVE

## 2022-07-30 ENCOUNTER — Telehealth: Payer: Self-pay | Admitting: *Deleted

## 2022-07-30 ENCOUNTER — Telehealth: Payer: Self-pay

## 2022-07-30 ENCOUNTER — Encounter: Payer: Self-pay | Admitting: *Deleted

## 2022-07-30 LAB — CULTURE, BLOOD (ROUTINE X 2)
Culture: NO GROWTH
Culture: NO GROWTH
Special Requests: ADEQUATE

## 2022-07-30 IMAGING — DX DG CHEST 2V
2 series · 2 of 2 positions shown · non-contrast
Comparison: Chest CT 10/14/2020

CLINICAL DATA: Cough

EXAM:
CHEST - 2 VIEW

[chest pa]
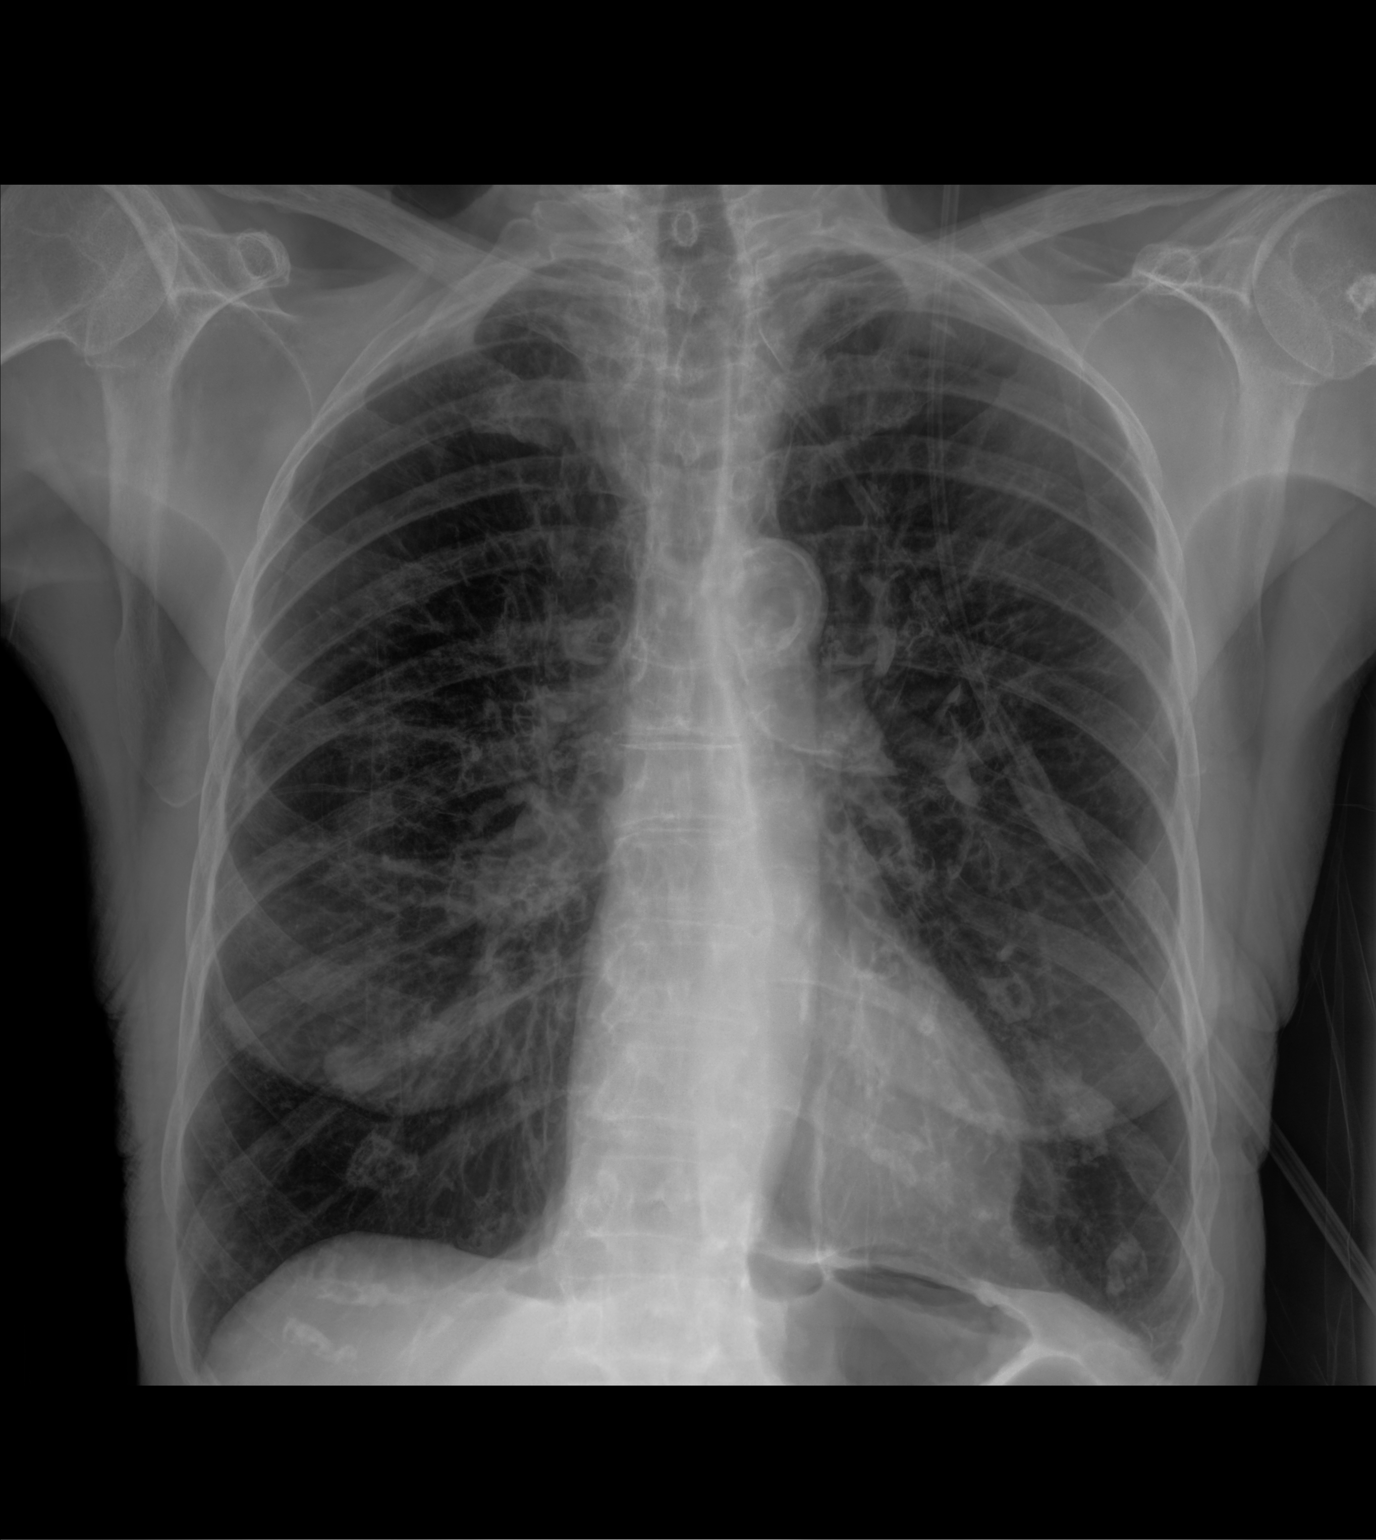

[chest lat]
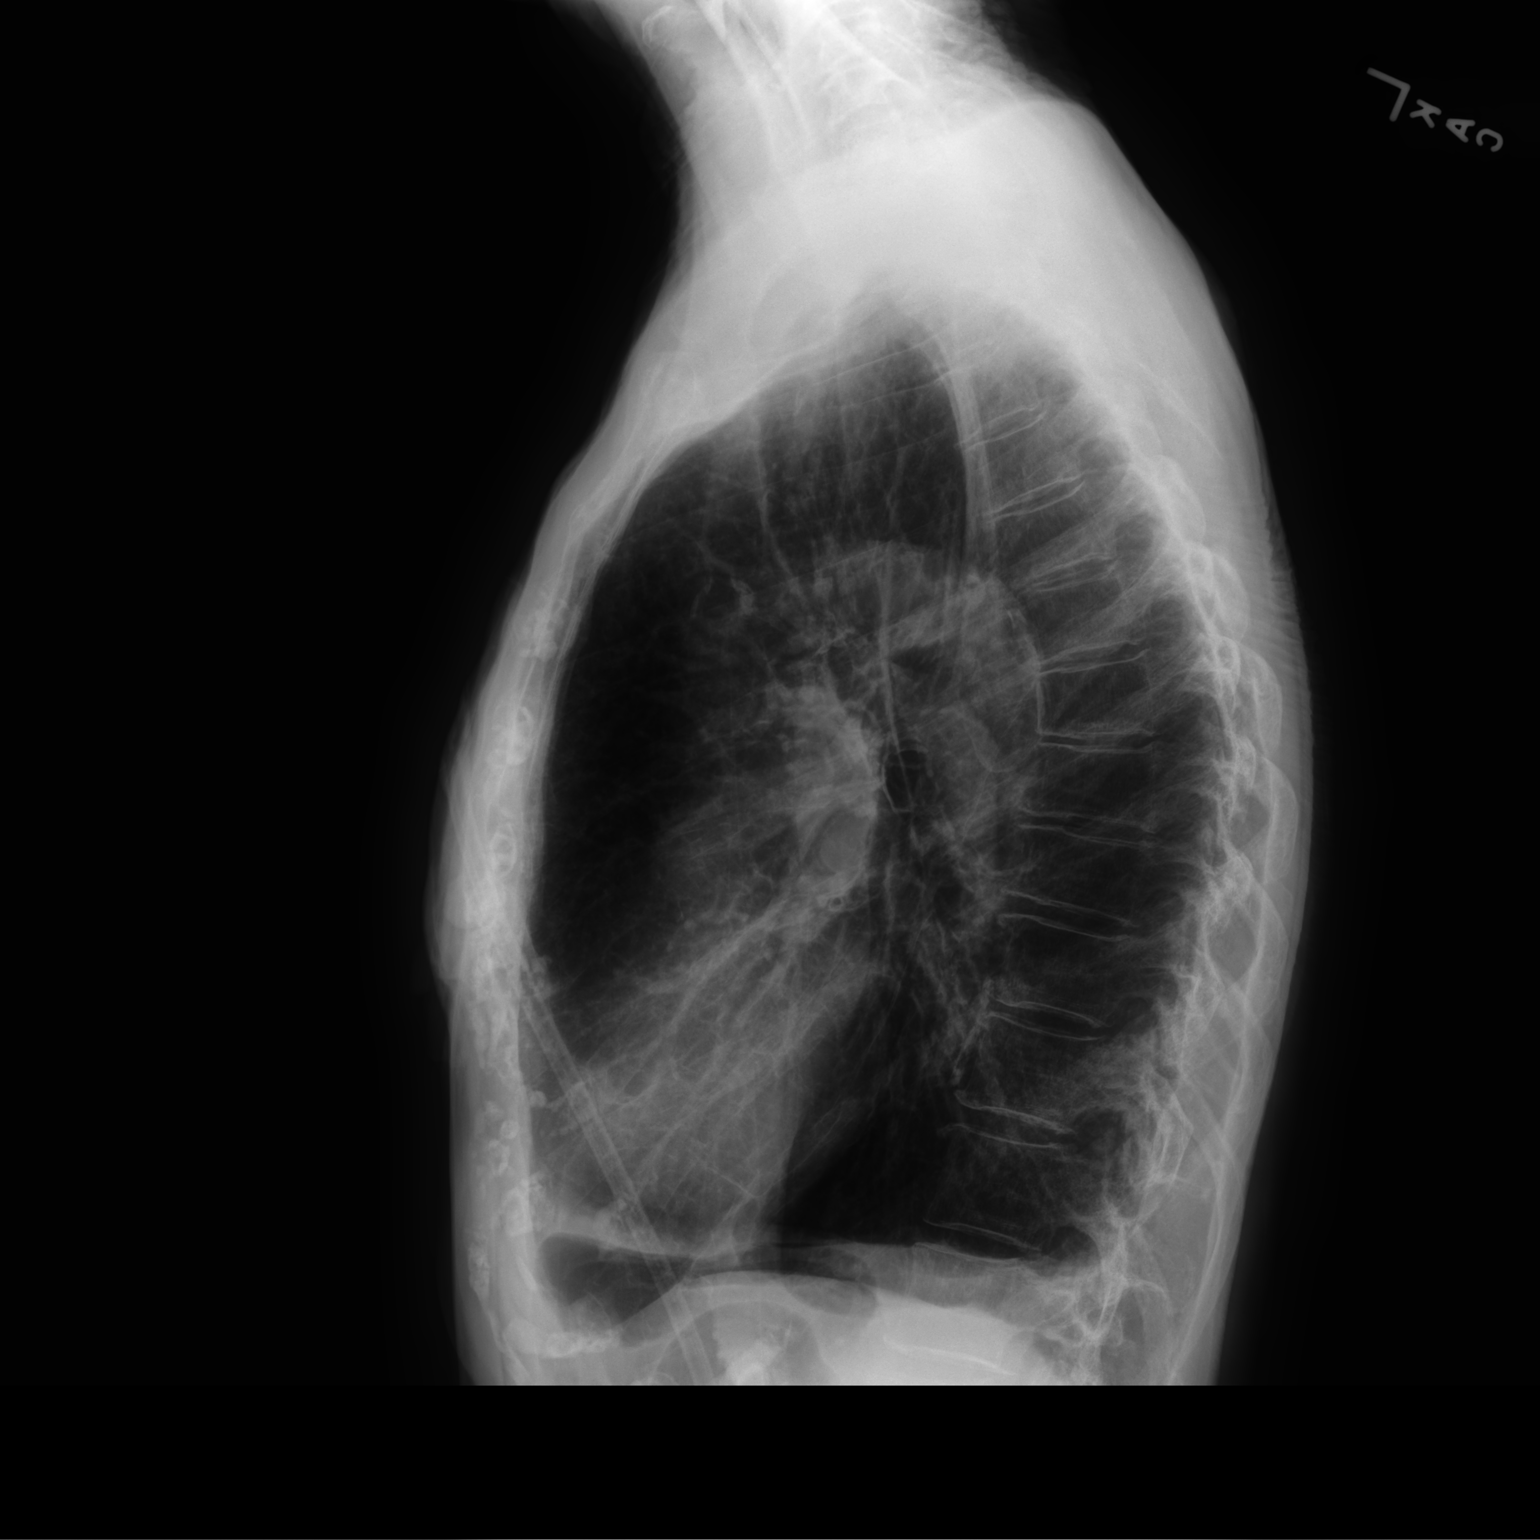

[2 of 2 positions shown; findings below may reference images not displayed]

FINDINGS: Emphysema with bronchitic changes. Symmetrical lower chest nodular
opacity consistent with nipple shadow. No acute airspace disease or
effusion. Scarring medial left base. Normal cardiac size. Aortic
atherosclerosis.
IMPRESSION: Emphysema and bronchitic changes.  No acute airspace disease

## 2022-07-30 NOTE — Patient Outreach (Signed)
  Care Coordination [facility] Note Transition Care Management Follow-up Telephone Call Date of discharge and from where: Friday, 07/27/22 [facility], COPD exacerbation How have you been since you were released from the hospital? "I am doing just fine; still feeling a little wiped out but I know that will get better as time passes.  My husband is taking great care of me; we have all of the new medicines and I am taking them just like they told me to.  I am not having any problems and will look forward to seeing Dr. [name] later this week" Any questions or concerns? No  Items Reviewed: Did the pt receive and understand the discharge instructions provided? Yes  Medications obtained and verified? Yes  Other? No  Any new allergies since your discharge? No  Dietary orders reviewed? Yes Do you have support at home? Yes  patient reports essentially independent in self-care; husband assists with ADL's as indicated/ needed  Home Care and Equipment/Supplies: Were home health services ordered? yes If so, what is the name of the agency? [facility]  Has the agency set up a time to come to the patient's home? yes Were any new equipment or medical supplies ordered?  No What is the name of the medical supply agency? N/A Were you able to get the supplies/equipment? not applicable Do you have any questions related to the use of the equipment or supplies? No N/A  Functional Questionnaire: (I = Independent and D = Dependent) ADLs: I  husband assists with ADL's as indicated/ needed  Bathing/Dressing- I  husband assists with ADL's as indicated/ needed  Meal Prep- I  husband assists with ADL's as indicated/ needed  Eating- I  Maintaining continence- I  Transferring/Ambulation- I  husband assists with ADL's as indicated/ needed  Managing Meds- I  husband assists with ADL's as indicated/ needed  Follow up appointments reviewed:  PCP Hospital f/u appt confirmed? Yes  Scheduled to see PCP Dr. [name] on Thursday  08/02/22 @ 9:00 am [facility] f/u appt confirmed? No  Scheduled to see - on - @ - Are transportation arrangements needed? No  If their condition worsens, is the pt aware to call PCP or go to the Emergency Dept.? Yes Was the patient provided with contact information for the PCP's office or ED? No patient declined- reports already has contact information for all care providers Was to pt encouraged to call back with questions or concerns? Yes  SDOH assessments and interventions completed:   Yes SDOH Interventions Today    Flowsheet Row Most Recent Value  SDOH Interventions   Food Insecurity Interventions Intervention Not Indicated  Transportation Interventions Intervention Not Indicated  [husband provides transportation]      Care Coordination Interventions:  No Care Coordination interventions needed at this time.   Encounter Outcome:  Pt. Visit Completed    Oneta Rack, RN, BSN, CCRN Alumnus RN CM Care Coordination/ Transition of [place] Management [phone]: direct office

## 2022-07-30 NOTE — Progress Notes (Addendum)
Chronic Care Management Pharmacy Assistant   Name: Misty Ferguson  MRN: [id number] DOB: 1933/08/15  Reason for Encounter: CCM [name]  Medications: Outpatient Encounter Medications as of 07/30/2022  Medication Sig   albuterol (VENTOLIN HFA) 108 (90 Base) MCG/ACT inhaler Inhale 2 puffs into the lungs every 6 (six) hours as needed for wheezing or shortness of breath.   ascorbic acid (VITAMIN C) 1000 MG tablet Take 1,000 mg by mouth daily.    aspirin EC 81 MG tablet Take 1 tablet (81 mg total) by mouth daily.   atorvastatin (LIPITOR) 10 MG tablet TAKE 1 TABLET EVERY DAY   budesonide-formoterol (SYMBICORT) 160-4.5 MCG/ACT inhaler Inhale 2 puffs into the lungs in the morning and at bedtime.   cefdinir (OMNICEF) 300 MG capsule Take 1 capsule (300 mg total) by mouth 2 (two) times daily for 3 days.   Cholecalciferol (VITAMIN D) 125 MCG (5000 UT) CAPS Take 5,000 Units by mouth daily.   clopidogrel (PLAVIX) 75 MG tablet TAKE 1 TABLET EVERY DAY   docusate sodium (COLACE) 100 MG capsule Take 100 mg by mouth 2 (two) times daily.   losartan (COZAAR) 25 MG tablet 25 mg daily.    Multiple Vitamins-Minerals (PRESERVISION AREDS) TABS Take 1 tablet by mouth 2 (two) times daily.   Omega-3 Fatty Acids (FISH OIL) 1000 MG CAPS Take 1,000 mg by mouth 2 (two) times daily.    OXYGEN Inhale 4 L into the lungs as needed.   predniSONE (DELTASONE) 20 MG tablet Take 2 tablets (40 mg total) by mouth daily with breakfast for 2 days, THEN 1 tablet (20 mg total) daily with breakfast for 2 days, THEN 0.5 tablets (10 mg total) daily with breakfast for 2 days.   verapamil (VERELAN PM) 360 MG 24 hr capsule Take 360 mg by mouth daily.   Vitamin E 180 MG CAPS Take 180 mg by mouth daily.   No facility-administered encounter medications on file as of 07/30/2022.   Reviewed hospital notes for details of recent visit. Has patient been contacted by Transitions of Care team? No Has patient seen PCP/specialist for  hospital follow up (summarize OV if yes): No  Admitted to the hospital on 07/25/2022. Discharge date was 07/27/2022.  Discharged from [facility].   Discharge diagnosis (Principal Problem): COPD with acute exacerbation  Patient was discharged to Home  Brief summary of hospital course: Misty Ferguson is a 88 y.o. female with medical history significant of COPD, chronic hypoxemia chronically on 4 L oxygen, essential hypertension, who presents to the hospital with worsening shortness of breath. In the emergency room, patient had a severe respite distress, initially placed on 10 L oxygen and changed to BiPAP.Her vital signs are temperature 98.1, heart rate 137, respirate 33, blood pressure 124/84. Lab results showed leukopenia of 3.1, hemoglobin 9.3, lactic acid 6.9.  Chest x-ray did not show any acute process with evidence of COPD.  Patient was given 2 L of fluid bolus, patient was started on Rocephin and Zithromax.   Assessment and Plan:   Acute on chronic respiratory failure with hypoxemia secondary to COPD exacerbation. COPD exacerbation. Patient initially had an increased oxygen requirement and a severe respiratory distress.  Was placed on BiPAP.   She is placed on antibiotics and steroids.   ED has ordered a CT angiogram to rule out PE, patient refused.  Given current infection, the probability of PE is low. Condition quickly improved after treatment, patient has been back to 4 L oxygen which  is part baseline since yesterday.  She is adamant she will be discharged today.  This point she is medically stable to do so.  Will continue steroid taper and antibiotics.   Severe sepsis. Acute kidney injury secondary to sepsis. Acute bacterial bronchitis. Right upper lobe bacterial pneumonia, ruled in, POA. Patient met sepsis criteria at time of admission with secondary tachycardia, bandemia, tachypnea, patient also had severe lactic acidosis and acute kidney injury.  She meet severe  sepsis criteria. Source of infection appears to be respiratory as patient procalcitonin level was significantly elevated.  Initial chest x-ray was clear with COPD changes, will repeat chest x-ray after IV fluids showed right upper lobe infiltrates, confirmed pneumonia. Blood cultures so far has no growth.  Respiratory panel was also negative. She is treated with antibiotics with Rocephin and Zithromax. Condition has improved, lactic acidosis improved.  Currently she feels much better, she is medically stable to be discharged.  Will continue to finish 5 days of antibiotics with oral antibiotics.   Hypokalemia. Resolved.   Severe protein calorie malnutrition. Patient significant muscle atrophy, BMI 17.57.   Hyperglycemia.   Most likely due to steroids and severe sepsis.  Improved.   Essential hypertension with hypertension urgency Patient has a severe elevation blood pressure due to severe sepsis.  Blood pressure is better, home medicines reviewed.  New Medications Started at [facility] Discharge:?? -Started albuterol (VENTOLIN HFA) 108 (90 Base) MCG/ACT inhaler -Started azithromycin (ZITHROMAX) 500 MG tablet -Started cefdinir (OMNICEF) 300 MG capsule -Started predniSONE (DELTASONE) 20 MG tablet   Medication Changes at Hospital Discharge: None noted  Medications Discontinued at Hospital Discharge: -Stopped gabapentin 300 MG capsule (NEURONTIN) -Stopped hydrochlorothiazide 12.5 MG capsule (MICROZIDE)  Medications that remain the same after Hospital Discharge:??  -All other medications will remain the same.    Next CCM appt: Non-CCM  Other upcoming appts: Vascular imaging 08/01/2022 PCP appointment on 08/02/2022 for hospital fu  Charlene Brooke, PharmD notified and will determine if action is needed.  Marijean Niemann, [place] Pharmacy Assistant [phone]   Pharmacist addendum: Reviewed chart. Pt has seen PCP for follow up, she was improved but not quite at baseline.  PharmD intervention is not warranted.  Charlene Brooke, PharmD, BCACP 08/10/22 11:35 AM

## 2022-07-31 ENCOUNTER — Other Ambulatory Visit (INDEPENDENT_AMBULATORY_CARE_PROVIDER_SITE_OTHER): Payer: Self-pay | Admitting: Nurse Practitioner

## 2022-07-31 DIAGNOSIS — I739 Peripheral vascular disease, unspecified: Secondary | ICD-10-CM

## 2022-07-31 DIAGNOSIS — M79606 Pain in leg, unspecified: Secondary | ICD-10-CM

## 2022-08-01 ENCOUNTER — Ambulatory Visit (INDEPENDENT_AMBULATORY_CARE_PROVIDER_SITE_OTHER): Payer: Medicare HMO

## 2022-08-01 DIAGNOSIS — M79606 Pain in leg, unspecified: Secondary | ICD-10-CM

## 2022-08-01 DIAGNOSIS — Z9889 Other specified postprocedural states: Secondary | ICD-10-CM | POA: Diagnosis not present

## 2022-08-01 DIAGNOSIS — I739 Peripheral vascular disease, unspecified: Secondary | ICD-10-CM

## 2022-08-02 ENCOUNTER — Ambulatory Visit (INDEPENDENT_AMBULATORY_CARE_PROVIDER_SITE_OTHER): Payer: Medicare HMO | Admitting: Family Medicine

## 2022-08-02 ENCOUNTER — Telehealth (INDEPENDENT_AMBULATORY_CARE_PROVIDER_SITE_OTHER): Payer: Medicare HMO | Admitting: Nurse Practitioner

## 2022-08-02 VITALS — BP 138/58 | HR 85 | Temp 97.6°F

## 2022-08-02 DIAGNOSIS — I739 Peripheral vascular disease, unspecified: Secondary | ICD-10-CM

## 2022-08-02 DIAGNOSIS — J441 Chronic obstructive pulmonary disease with (acute) exacerbation: Secondary | ICD-10-CM

## 2022-08-02 DIAGNOSIS — L989 Disorder of the skin and subcutaneous tissue, unspecified: Secondary | ICD-10-CM | POA: Diagnosis not present

## 2022-08-02 DIAGNOSIS — Z9889 Other specified postprocedural states: Secondary | ICD-10-CM

## 2022-08-02 NOTE — Assessment & Plan Note (Signed)
Multiple squamous cell carcinomas on bilateral legs.  Has planned dermatology excision once angiogram and peripheral artery disease treated.

## 2022-08-02 NOTE — Progress Notes (Signed)
Patient ID: Misty Ferguson, female    DOB: 06-Jan-1934, 88 y.o.   MRN: [id number]  This visit was conducted in person.  Blood pressure (!) 138/58, pulse 85, temperature 97.6 F (36.4 C), temperature source Oral, SpO2 100 %.   CC:  Chief Complaint  Patient presents with   COPD   Hospitalization Follow-up    Subjective:   HPI: Tyrone CARMACK is a 88 y.o. female presenting on 08/02/2022 for COPD and Hospitalization Follow-up  Hospitalization from 12/13-12/15 for COPD with acute exacerbation and pneumonia.  Patient with chronic hypoxic respiratory failure on continuous oxygen. She presented to the hospital with worsening shortness of breath.  In the emergency room she had severe respiratory distress and was placed on 10 L oxygen and changed to BiPAP.  Chest x-ray did not show any acute process.  She was treated with IV fluids and started on Rocephin and Zithromax.  She was also started on steroids. Patient refused CT angiogram to rule out pulmonary embolus. Of note she did meet severe sepsis criteria. Repeat chest x-ray after IV fluid showed right upper lobe infiltrates Blood cultures negative.   She complete her last prednisone this morning. Competed antibitoic course.,  No recent fever. She is not quite at her baseline.  She has been feeling really anxious, one panic attack first night... she treats this successfully with prayer and relaxation techniques.   Planning angiogram on left leg PAD.   Two sqamous cell Ca on legs.. plans removal.   Has PT and  skilled nursing... home health.     Relevant past medical, surgical, family and social history reviewed and updated as indicated. Interim medical history since our last visit reviewed. Allergies and medications reviewed and updated. Outpatient Medications Prior to Visit  Medication Sig Dispense Refill   albuterol (VENTOLIN HFA) 108 (90 Base) MCG/ACT inhaler Inhale 2 puffs into the lungs every 6 (six) hours as needed for  wheezing or shortness of breath. 8 g 0   ascorbic acid (VITAMIN C) 1000 MG tablet Take 1,000 mg by mouth daily.      aspirin EC 81 MG tablet Take 1 tablet (81 mg total) by mouth daily. 150 tablet 2   atorvastatin (LIPITOR) 10 MG tablet TAKE 1 TABLET EVERY DAY 90 tablet 3   budesonide-formoterol (SYMBICORT) 160-4.5 MCG/ACT inhaler Inhale 2 puffs into the lungs in the morning and at bedtime. 3 each 0   Cholecalciferol (VITAMIN D) 125 MCG (5000 UT) CAPS Take 5,000 Units by mouth daily.     clopidogrel (PLAVIX) 75 MG tablet TAKE 1 TABLET EVERY DAY 90 tablet 3   docusate sodium (COLACE) 100 MG capsule Take 100 mg by mouth 2 (two) times daily.     losartan (COZAAR) 25 MG tablet 25 mg daily.      Multiple Vitamins-Minerals (PRESERVISION AREDS) TABS Take 1 tablet by mouth 2 (two) times daily.     Omega-3 Fatty Acids (FISH OIL) 1000 MG CAPS Take 1,000 mg by mouth 2 (two) times daily.      OXYGEN Inhale 4 L into the lungs as needed.     verapamil (VERELAN PM) 360 MG 24 hr capsule Take 360 mg by mouth daily.     Vitamin E 180 MG CAPS Take 180 mg by mouth daily.     predniSONE (DELTASONE) 20 MG tablet Take 2 tablets (40 mg total) by mouth daily with breakfast for 2 days, THEN 1 tablet (20 mg total) daily with breakfast for 2 days, THEN  0.5 tablets (10 mg total) daily with breakfast for 2 days. (Patient not taking: Reported on 08/02/2022) 7 tablet 0   No facility-administered medications prior to visit.     Per HPI unless specifically indicated in ROS section below Review of Systems  Constitutional:  Negative for fatigue and fever.  HENT:  Negative for congestion.   Eyes:  Negative for pain.  Respiratory:  Positive for cough and shortness of breath.   Cardiovascular:  Negative for chest pain, palpitations and leg swelling.  Gastrointestinal:  Negative for abdominal pain.  Genitourinary:  Negative for dysuria and vaginal bleeding.  Musculoskeletal:  Negative for back pain.  Neurological:  Negative for  syncope, light-headedness and headaches.  Psychiatric/Behavioral:  Negative for dysphoric mood.    Objective:  There were no vitals taken for this visit.  Wt Readings from Last 3 Encounters:  07/25/22 93 lb (42.2 kg)  05/29/22 94 lb (42.6 kg)  05/09/22 94 lb (42.6 kg)      Physical Exam Constitutional:      General: She is not in acute distress.    Appearance: Normal appearance. She is well-developed. She is not ill-appearing or toxic-appearing.  HENT:     Head: Normocephalic.     Right Ear: Hearing, tympanic membrane, ear canal and external ear normal. Tympanic membrane is not erythematous, retracted or bulging.     Left Ear: Hearing, tympanic membrane, ear canal and external ear normal. Tympanic membrane is not erythematous, retracted or bulging.     Nose: No mucosal edema or rhinorrhea.     Right Sinus: No maxillary sinus tenderness or frontal sinus tenderness.     Left Sinus: No maxillary sinus tenderness or frontal sinus tenderness.     Mouth/Throat:     Pharynx: Uvula midline.  Eyes:     General: Lids are normal. Lids are everted, no foreign bodies appreciated.     Conjunctiva/sclera: Conjunctivae normal.     Pupils: Pupils are equal, round, and reactive to light.  Neck:     Thyroid: No thyroid mass or thyromegaly.     Vascular: No carotid bruit.     Trachea: Trachea normal.  Cardiovascular:     Rate and Rhythm: Normal rate and regular rhythm.     Pulses: Normal pulses.     Heart sounds: Normal heart sounds, S1 normal and S2 normal. No murmur heard.    No friction rub. No gallop.  Pulmonary:     Effort: Pulmonary effort is normal. No tachypnea or respiratory distress.     Breath sounds: Decreased air movement present. Examination of the right-upper field reveals decreased breath sounds. Examination of the left-upper field reveals decreased breath sounds. Examination of the right-middle field reveals decreased breath sounds. Examination of the left-middle field reveals  decreased breath sounds. Examination of the right-lower field reveals decreased breath sounds. Examination of the left-lower field reveals decreased breath sounds. Decreased breath sounds present. No wheezing, rhonchi or rales.     Comments: On continuous oxygen, breath sounds at baseline but diminished severely throughout Abdominal:     General: Bowel sounds are normal.     Palpations: Abdomen is soft.     Tenderness: There is no abdominal tenderness.  Musculoskeletal:     Cervical back: Normal range of motion and neck supple.  Skin:    General: Skin is warm and dry.     Findings: No rash.  Neurological:     Mental Status: She is alert.  Psychiatric:  Mood and Affect: Mood is not anxious or depressed.        Speech: Speech normal.        Behavior: Behavior normal. Behavior is cooperative.        Thought Content: Thought content normal.        Judgment: Judgment normal.       Results for orders placed or performed during the hospital encounter of 07/25/22  Resp panel by RT-PCR (RSV, Flu A&B, Covid) Anterior Nasal Swab   Specimen: Anterior Nasal Swab  Result Value Ref Range   SARS Coronavirus 2 by RT PCR NEGATIVE NEGATIVE   Influenza A by PCR NEGATIVE NEGATIVE   Influenza B by PCR NEGATIVE NEGATIVE   Resp Syncytial Virus by PCR NEGATIVE NEGATIVE  Culture, blood (Routine X 2) Call MD if unable to obtain prior to antibiotics being given   Specimen: BLOOD  Result Value Ref Range   Specimen Description BLOOD BLOOD LEFT FOREARM    Special Requests      BOTTLES DRAWN AEROBIC AND ANAEROBIC Blood Culture results may not be optimal due to an inadequate volume of blood received in culture bottles   Culture      NO GROWTH 5 DAYS Performed at [facility], [address] [place], [place] [address]    Report Status 07/30/2022 FINAL   Culture, blood (Routine X 2) Call MD if unable to obtain prior to antibiotics being given   Specimen: BLOOD  Result Value Ref Range    Specimen Description BLOOD BLOOD RIGHT FOREARM    Special Requests      BOTTLES DRAWN AEROBIC AND ANAEROBIC Blood Culture adequate volume   Culture      NO GROWTH 5 DAYS Performed at [facility], [address] [place], [place] [address]    Report Status 07/30/2022 FINAL   Respiratory (~20 pathogens) panel by PCR   Specimen: Nasopharyngeal Swab; Respiratory  Result Value Ref Range   Adenovirus NOT DETECTED NOT DETECTED   Coronavirus 229E NOT DETECTED NOT DETECTED   Coronavirus HKU1 NOT DETECTED NOT DETECTED   Coronavirus NL63 NOT DETECTED NOT DETECTED   Coronavirus OC43 NOT DETECTED NOT DETECTED   Metapneumovirus NOT DETECTED NOT DETECTED   Rhinovirus / Enterovirus NOT DETECTED NOT DETECTED   Influenza A NOT DETECTED NOT DETECTED   Influenza B NOT DETECTED NOT DETECTED   Parainfluenza Virus 1 NOT DETECTED NOT DETECTED   Parainfluenza Virus 2 NOT DETECTED NOT DETECTED   Parainfluenza Virus 3 NOT DETECTED NOT DETECTED   Parainfluenza Virus 4 NOT DETECTED NOT DETECTED   Respiratory Syncytial Virus NOT DETECTED NOT DETECTED   Bordetella pertussis NOT DETECTED NOT DETECTED   Bordetella Parapertussis NOT DETECTED NOT DETECTED   Chlamydophila pneumoniae NOT DETECTED NOT DETECTED   Mycoplasma pneumoniae NOT DETECTED NOT DETECTED  Comprehensive metabolic panel  Result Value Ref Range   Sodium 137 135 - 145 mmol/L   Potassium 3.5 3.5 - 5.1 mmol/L   Chloride 96 (L) 98 - 111 mmol/L   CO2 25 22 - 32 mmol/L   Glucose, Bld 256 (H) 70 - 99 mg/dL   BUN 21 8 - 23 mg/dL   Creatinine, Ser 0.98 0.44 - 1.00 mg/dL   Calcium 9.5 8.9 - 10.3 mg/dL   Total Protein 7.4 6.5 - 8.1 g/dL   Albumin 4.0 3.5 - 5.0 g/dL   AST 53 (H) 15 - 41 U/L   ALT 67 (H) 0 - 44 U/L   Alkaline Phosphatase 93 38 -  126 U/L   Total Bilirubin 1.1 0.3 - 1.2 mg/dL   GFR, Estimated 56 (L) >60 mL/min   Anion gap 16 (H) 5 - 15  CBC with Differential  Result Value Ref Range   WBC 9.7 4.0 - 10.5 K/uL   RBC 4.15 3.87  - 5.11 MIL/uL   Hemoglobin 12.8 12.0 - 15.0 g/dL   HCT 39.7 36.0 - 46.0 %   MCV 95.7 80.0 - 100.0 fL   MCH 30.8 26.0 - 34.0 pg   MCHC 32.2 30.0 - 36.0 g/dL   RDW 12.8 11.5 - 15.5 %   Platelets 287 150 - 400 K/uL   nRBC 0.0 0.0 - 0.2 %   Neutrophils Relative % 71 %   Neutro Abs 6.9 1.7 - 7.7 K/uL   Lymphocytes Relative 15 %   Lymphs Abs 1.4 0.7 - 4.0 K/uL   Monocytes Relative 14 %   Monocytes Absolute 1.3 (H) 0.1 - 1.0 K/uL   Eosinophils Relative 0 %   Eosinophils Absolute 0.0 0.0 - 0.5 K/uL   Basophils Relative 0 %   Basophils Absolute 0.0 0.0 - 0.1 K/uL   WBC Morphology INCREASED BANDS (>20% BANDS)    RBC Morphology MORPHOLOGY UNREMARKABLE    Smear Review Normal platelet morphology    Immature Granulocytes 0 %   Abs Immature Granulocytes 0.02 0.00 - 0.07 K/uL  Blood gas, venous  Result Value Ref Range   pH, Ven 7.39 7.25 - 7.43   pCO2, Ven 46 44 - 60 mmHg   pO2, Ven 70 (H) 32 - 45 mmHg   Bicarbonate 27.8 20.0 - 28.0 mmol/L   Acid-Base Excess 2.2 (H) 0.0 - 2.0 mmol/L   O2 Saturation 94.5 %   Patient temperature 37.0    Collection site VEIN   Beta-hydroxybutyric acid  Result Value Ref Range   Beta-Hydroxybutyric Acid 0.28 (H) 0.05 - 0.27 mmol/L  Lactic acid, plasma  Result Value Ref Range   Lactic Acid, Venous 6.9 (HH) 0.5 - 1.9 mmol/L  Lactic acid, plasma  Result Value Ref Range   Lactic Acid, Venous 6.3 (HH) 0.5 - 1.9 mmol/L  Hemoglobin A1c  Result Value Ref Range   Hgb A1c MFr Bld 5.6 4.8 - 5.6 %   Mean Plasma Glucose 114 mg/dL  Creatinine, serum  Result Value Ref Range   Creatinine, Ser 1.11 (H) 0.44 - 1.00 mg/dL   GFR, Estimated 48 (L) >60 mL/min  Protime-INR  Result Value Ref Range   Prothrombin Time 14.1 11.4 - 15.2 seconds   INR 1.1 0.8 - 1.2  APTT  Result Value Ref Range   aPTT 36 24 - 36 seconds  Blood gas, venous  Result Value Ref Range   pH, Ven 7.29 7.25 - 7.43   pCO2, Ven 54 44 - 60 mmHg   pO2, Ven 64 (H) 32 - 45 mmHg   Bicarbonate 26.0 20.0  - 28.0 mmol/L   Acid-base deficit 1.4 0.0 - 2.0 mmol/L   O2 Saturation 90.8 %   Patient temperature 37.0    Collection site VEIN   Comprehensive metabolic panel  Result Value Ref Range   Sodium 139 135 - 145 mmol/L   Potassium 3.0 (L) 3.5 - 5.1 mmol/L   Chloride 103 98 - 111 mmol/L   CO2 29 22 - 32 mmol/L   Glucose, Bld 163 (H) 70 - 99 mg/dL   BUN 20 8 - 23 mg/dL   Creatinine, Ser 0.79 0.44 - 1.00 mg/dL   Calcium 8.5 (  L) 8.9 - 10.3 mg/dL   Total Protein 5.4 (L) 6.5 - 8.1 g/dL   Albumin 2.9 (L) 3.5 - 5.0 g/dL   AST 24 15 - 41 U/L   ALT 40 0 - 44 U/L   Alkaline Phosphatase 63 38 - 126 U/L   Total Bilirubin 0.7 0.3 - 1.2 mg/dL   GFR, Estimated >60 >60 mL/min   Anion gap 7 5 - 15  Hepatitis panel, acute  Result Value Ref Range   Hepatitis B Surface Ag NON REACTIVE NON REACTIVE   HCV Ab NON REACTIVE NON REACTIVE   Hep A IgM NON REACTIVE NON REACTIVE   Hep B C IgM NON REACTIVE NON REACTIVE  CBC with Differential/Platelet  Result Value Ref Range   WBC 3.1 (L) 4.0 - 10.5 K/uL   RBC 2.99 (L) 3.87 - 5.11 MIL/uL   Hemoglobin 9.3 (L) 12.0 - 15.0 g/dL   HCT 28.8 (L) 36.0 - 46.0 %   MCV 96.3 80.0 - 100.0 fL   MCH 31.1 26.0 - 34.0 pg   MCHC 32.3 30.0 - 36.0 g/dL   RDW 12.6 11.5 - 15.5 %   Platelets 167 150 - 400 K/uL   nRBC 0.0 0.0 - 0.2 %   Neutrophils Relative % 81 %   Neutro Abs 2.6 1.7 - 7.7 K/uL   Lymphocytes Relative 5 %   Lymphs Abs 0.2 (L) 0.7 - 4.0 K/uL   Monocytes Relative 12 %   Monocytes Absolute 0.4 0.1 - 1.0 K/uL   Eosinophils Relative 0 %   Eosinophils Absolute 0.0 0.0 - 0.5 K/uL   Basophils Relative 1 %   Basophils Absolute 0.0 0.0 - 0.1 K/uL   RBC Morphology MORPHOLOGY UNREMARKABLE    Smear Review Normal platelet morphology    Immature Granulocytes 1 %   Abs Immature Granulocytes 0.02 0.00 - 0.07 K/uL  Magnesium  Result Value Ref Range   Magnesium 2.1 1.7 - 2.4 mg/dL  Procalcitonin - Baseline  Result Value Ref Range   Procalcitonin 4.38 ng/mL  Lactic  acid, plasma  Result Value Ref Range   Lactic Acid, Venous 2.3 (HH) 0.5 - 1.9 mmol/L  Lactic acid, plasma  Result Value Ref Range   Lactic Acid, Venous 1.5 0.5 - 1.9 mmol/L  Basic metabolic panel  Result Value Ref Range   Sodium 139 135 - 145 mmol/L   Potassium 3.9 3.5 - 5.1 mmol/L   Chloride 104 98 - 111 mmol/L   CO2 28 22 - 32 mmol/L   Glucose, Bld 146 (H) 70 - 99 mg/dL   BUN 27 (H) 8 - 23 mg/dL   Creatinine, Ser 0.76 0.44 - 1.00 mg/dL   Calcium 8.5 (L) 8.9 - 10.3 mg/dL   GFR, Estimated >60 >60 mL/min   Anion gap 7 5 - 15  Glucose, capillary  Result Value Ref Range   Glucose-Capillary 146 (H) 70 - 99 mg/dL  Glucose, capillary  Result Value Ref Range   Glucose-Capillary 137 (H) 70 - 99 mg/dL  Glucose, capillary  Result Value Ref Range   Glucose-Capillary 140 (H) 70 - 99 mg/dL  CBG monitoring, ED  Result Value Ref Range   Glucose-Capillary 290 (H) 70 - 99 mg/dL  CBG monitoring, ED  Result Value Ref Range   Glucose-Capillary 178 (H) 70 - 99 mg/dL  CBG monitoring, ED  Result Value Ref Range   Glucose-Capillary 257 (H) 70 - 99 mg/dL  CBG monitoring, ED  Result Value Ref Range   Glucose-Capillary 162 (  H) 70 - 99 mg/dL  CBG monitoring, ED  Result Value Ref Range   Glucose-Capillary 162 (H) 70 - 99 mg/dL  CBG monitoring, ED  Result Value Ref Range   Glucose-Capillary 115 (H) 70 - 99 mg/dL   Comment 1 Notify RN    Comment 2 Document in Chart   Troponin I (High Sensitivity)  Result Value Ref Range   Troponin I (High Sensitivity) 9 <18 ng/L  Troponin I (High Sensitivity)  Result Value Ref Range   Troponin I (High Sensitivity) 11 <18 ng/L     COVID 19 screen:  No recent travel or known exposure to COVID19 The patient denies respiratory symptoms of COVID 19 at this time. The importance of social distancing was discussed today.   Assessment and Plan  Problem List Items Addressed This Visit     COPD with acute exacerbation ([place]) - Primary    Acute, status post  antibiotics and prednisone taper.  She is continuing to improve but we reviewed reasons to call for possible further antibiotics versus prednisone.  Of note prednisone keeps her up at night and she would like to avoid further use unless absolutely necessary. Continue controller inhaler and albuterol as needed.  Return and ER precautions provided      PVD (peripheral vascular disease) ([place])    Chronic, with acute worsening in left leg.  She has upcoming angiogram next week      Skin lesions    Multiple squamous cell carcinomas on bilateral legs.  Has planned dermatology excision once angiogram and peripheral artery disease treated.        Eliezer Lofts, MD

## 2022-08-02 NOTE — Assessment & Plan Note (Signed)
Acute, status post antibiotics and prednisone taper.  She is continuing to improve but we reviewed reasons to call for possible further antibiotics versus prednisone.  Of note prednisone keeps her up at night and she would like to avoid further use unless absolutely necessary. Continue controller inhaler and albuterol as needed.  Return and ER precautions provided

## 2022-08-02 NOTE — Telephone Encounter (Signed)
MRN : [id number]  Misty Ferguson is a 88 y.o. (22-Dec-1933) female who presents with chief complaint of check circulation.  History of Present Illness: Misty Ferguson is an 88 year old female practice for her peripheral arterial disease.  She recently contacted our office noting that she has squamous cell cancers lesions on her bilateral lower extremities and will be undergoing excision and removal of these in January.  There was concern by her dermatologist that her history of peripheral arterial disease that she may have decreased perfusion which will result in delayed wound healing.  The patient has previously underwent angiogram on her right lower extremity but has never had any intervention on her left.  The patient underwent noninvasive studies today which showed an ABI 0.92 on the right and ABI 0.74 on the left.  This does show a notable decrease in perfusion on the left whereas the previous ABI was 1.04 in September 2023.  Additional lower extremity arterial duplex shows a 75 to 99% stenosis of the left SFA  No outpatient medications have been marked as taking for the 08/02/22 encounter (Telephone) with Kris Hartmann, NP.    Past Medical History:  Diagnosis Date   AK (actinic keratosis) 12/08/2020   right pretibia inferior bx proven, LN2 01/10/21   Burping    Chronic airway obstruction, not elsewhere classified    Dyspnea    Dysrhythmia    Macular degeneration (senile) of retina, unspecified    Malignant neoplasm of urinary bladder ([place]) 03/2019   Partial bladder resection and Rad tx's.    Obstructive chronic bronchitis with exacerbation ([place])    Osteoporosis, unspecified    Oxygen deficiency    Personal history of peptic ulcer disease    SCC (squamous cell carcinoma) 12/08/2020   right pretibia superior, EDC 01/10/21   SCC (squamous cell carcinoma) 07/03/2022   left medial calf needs ED&C   Squamous cell carcinoma in situ (SCCIS) 07/03/2022   right pretibia  sccis needs ED&C   Tobacco use disorder    Unspecified essential hypertension    Unspecified glaucoma(365.9)     Past Surgical History:  Procedure Laterality Date   CATARACT EXTRACTION W/ INTRAOCULAR LENS  IMPLANT, BILATERAL     CYSTOSCOPY WITH STENT PLACEMENT Left 05/22/2019   Procedure: CYSTOSCOPY WITH STENT PLACEMENT;  Surgeon: Billey Co, MD;  Location: [facility];  Service: Urology;  Laterality: Left;   ELBOW FRACTURE SURGERY Left    LOWER EXTREMITY ANGIOGRAPHY Right 11/20/2021   Procedure: Lower Extremity Angiography;  Surgeon: Algernon Huxley, MD;  Location: [facility];  Service: Cardiovascular;  Laterality: Right;   TRANSURETHRAL RESECTION OF BLADDER TUMOR N/A 05/22/2019   Procedure: TRANSURETHRAL RESECTION OF BLADDER TUMOR (TURBT);  Surgeon: Billey Co, MD;  Location: [facility];  Service: Urology;  Laterality: N/A;    Social History Social History   Tobacco Use   Smoking status: Former    Packs/day: 1.30    Years: 45.00    Total pack years: 58.50    Types: Cigarettes    Quit date: 1998    Years since quitting: 25.9   Smokeless tobacco: Never  Vaping Use   Vaping Use: Never used  Substance Use Topics   Alcohol use: Yes    Alcohol/week: 14.0 standard drinks of alcohol    Types: 14 Glasses of wine per week    Comment: 1-2 wine daily   Drug use: No  Family History Family History  Adopted: Yes    No Known Allergies   REVIEW OF SYSTEMS (Negative unless checked)  Constitutional: '[]'$ Weight loss  '[]'$ Fever  '[]'$ Chills Cardiac: '[]'$ Chest pain   '[]'$ Chest pressure   '[]'$ Palpitations   '[]'$ Shortness of breath when laying flat   '[]'$ Shortness of breath with exertion. Vascular:  '[x]'$ Pain in legs with walking   '[]'$ Pain in legs at rest  '[]'$ History of DVT   '[]'$ Phlebitis   '[]'$ Swelling in legs   '[]'$ Varicose veins   '[]'$ Non-healing ulcers Pulmonary:   '[]'$ Uses home oxygen   '[]'$ Productive cough   '[]'$ Hemoptysis   '[]'$ Wheeze  '[]'$ COPD   '[]'$ Asthma Neurologic:  '[]'$ Dizziness   '[]'$ Seizures    '[]'$ History of stroke   '[]'$ History of TIA  '[]'$ Aphasia   '[]'$ Vissual changes   '[]'$ Weakness or numbness in arm   '[x]'$ Weakness or numbness in leg Musculoskeletal:   '[]'$ Joint swelling   '[]'$ Joint pain   '[]'$ Low back pain Hematologic:  '[x]'$ Easy bruising  '[]'$ Easy bleeding   '[]'$ Hypercoagulable state   '[]'$ Anemic Gastrointestinal:  '[]'$ Diarrhea   '[]'$ Vomiting  '[]'$ Gastroesophageal reflux/heartburn   '[]'$ Difficulty swallowing. Genitourinary:  '[]'$ Chronic kidney disease   '[]'$ Difficult urination  '[]'$ Frequent urination   '[]'$ Blood in urine Skin:  '[]'$ Rashes   '[]'$ Ulcers  Psychological:  '[]'$ History of anxiety   '[]'$  History of major depression.  Physical Examination   There is no height or weight on file to calculate BMI. Gen: WD/WN, NAD Head: [place]/AT, No temporalis wasting.  Ear/Nose/Throat: Hearing grossly intact, nares w/o erythema or drainage Eyes: PER, EOMI, sclera nonicteric.  Neck: Supple, no masses.  No bruit or JVD.  Pulmonary:  Good air movement, no audible wheezing, no use of accessory muscles.  Cardiac: RRR, normal S1, S2, no Murmurs. Vascular:  mild trophic changes, no open wounds Vessel Right Left  Radial Palpable Palpable  PT Not Palpable Not Palpable  DP Not Palpable Not Palpable  Gastrointestinal: soft, non-distended. No guarding/no peritoneal signs.  Musculoskeletal: M/S 5/5 throughout.  No visible deformity.  Neurologic: CN 2-12 intact. Pain and light touch intact in extremities.  Symmetrical.  Speech is fluent. Motor exam as listed above. Psychiatric: Judgment intact, Mood & affect appropriate for pt's clinical situation. Dermatologic: No rashes or ulcers noted.  No changes consistent with cellulitis.   CBC Lab Results  Component Value Date   WBC 3.1 (L) 07/26/2022   HGB 9.3 (L) 07/26/2022   HCT 28.8 (L) 07/26/2022   MCV 96.3 07/26/2022   PLT 167 07/26/2022    BMET    Component Value Date/Time   NA 139 07/27/2022 0532   NA 141 06/10/2019 1324   K 3.9 07/27/2022 0532   K 4.1 07/23/2012 1536   CL 104  07/27/2022 0532   CO2 28 07/27/2022 0532   GLUCOSE 146 (H) 07/27/2022 0532   BUN 27 (H) 07/27/2022 0532   BUN 15 06/10/2019 1324   CREATININE 0.76 07/27/2022 0532   CREATININE 1.26 (H) 12/23/2012 1440   CALCIUM 8.5 (L) 07/27/2022 0532   GFRNONAA >60 07/27/2022 0532   GFRAA 62 06/10/2019 1324   Estimated Creatinine Clearance: 33.6 mL/min (by C-G formula based on SCr of 0.76 mg/dL).  COAG Lab Results  Component Value Date   INR 1.1 07/25/2022   INR 0.88 09/08/2017   INR 1.16 08/18/2010    Radiology DG Chest 2 View  Result Date: 07/26/2022 CLINICAL DATA:  COPD.  Chest tightness for 4 days EXAM: CHEST - 2 VIEW COMPARISON:  Yesterday FINDINGS: Left proximal humerus 1.0 cm sclerotic lesion is likely an  enchondroma. Remote posterolateral lower left rib fractures. Midline trachea. Normal heart size. Atherosclerosis in the transverse aorta. No pleural effusion or pneumothorax. Mild hyperinflation and moderate lower lung predominant central airway thickening. Lateral right upper lobe subtle opacity is not readily apparent on the prior. Similar medial left lower lobe scarring. IMPRESSION: COPD/chronic bronchitis. Subtle increased density projecting over the inferolateral right upper lobe for which developing airspace disease is possible. Depending on symptomatology, consider plain film follow-up in 3-5 days. Electronically Signed   By: Abigail Miyamoto M.D.   On: 07/26/2022 14:01   DG Chest Portable 1 View  Result Date: 07/25/2022 CLINICAL DATA:  Shortness of breath. EXAM: PORTABLE CHEST 1 VIEW COMPARISON:  Chest x-ray dated January 23, 2022. FINDINGS: The heart size and mediastinal contours are within normal limits. Normal pulmonary vascularity. The lungs remain hyperinflated with emphysematous changes and coarsened interstitial markings. No focal consolidation, pleural effusion, or pneumothorax. No acute osseous abnormality. IMPRESSION: 1. No active disease. COPD. Electronically Signed   By: Titus Dubin M.D.   On: 07/25/2022 10:59     Assessment/Plan 1. Peripheral arterial disease with history of revascularization (HCC)  Recommend:  The patient has evidence of severe atherosclerotic changes of both lower extremities associated upcoming surgery, there is concern that the patient may not any wounds from her upcoming surgery due to her decreased perfusion  Patient should undergo angiography of the left lower extremity with the hope for intervention for limb salvage.  The risks and benefits as well as the alternative therapies was discussed in detail with the patient.  All questions were answered.  Patient agrees to proceed with left angiography.  The patient will follow up with me in the office after the procedure.     Kris Hartmann, NP  08/02/2022 2:17 AM

## 2022-08-02 NOTE — Assessment & Plan Note (Signed)
Chronic, with acute worsening in left leg.  She has upcoming angiogram next week

## 2022-08-14 ENCOUNTER — Encounter (INDEPENDENT_AMBULATORY_CARE_PROVIDER_SITE_OTHER): Payer: Self-pay

## 2022-08-17 ENCOUNTER — Telehealth (INDEPENDENT_AMBULATORY_CARE_PROVIDER_SITE_OTHER): Payer: Self-pay

## 2022-08-17 DIAGNOSIS — Z9181 History of falling: Secondary | ICD-10-CM

## 2022-08-17 DIAGNOSIS — Z7902 Long term (current) use of antithrombotics/antiplatelets: Secondary | ICD-10-CM

## 2022-08-17 DIAGNOSIS — M810 Age-related osteoporosis without current pathological fracture: Secondary | ICD-10-CM | POA: Diagnosis not present

## 2022-08-17 DIAGNOSIS — H3530 Unspecified macular degeneration: Secondary | ICD-10-CM

## 2022-08-17 DIAGNOSIS — H409 Unspecified glaucoma: Secondary | ICD-10-CM

## 2022-08-17 DIAGNOSIS — E872 Acidosis, unspecified: Secondary | ICD-10-CM | POA: Diagnosis not present

## 2022-08-17 DIAGNOSIS — Z8711 Personal history of peptic ulcer disease: Secondary | ICD-10-CM

## 2022-08-17 DIAGNOSIS — E43 Unspecified severe protein-calorie malnutrition: Secondary | ICD-10-CM

## 2022-08-17 DIAGNOSIS — Z7951 Long term (current) use of inhaled steroids: Secondary | ICD-10-CM

## 2022-08-17 DIAGNOSIS — J181 Lobar pneumonia, unspecified organism: Secondary | ICD-10-CM | POA: Diagnosis not present

## 2022-08-17 DIAGNOSIS — Z87891 Personal history of nicotine dependence: Secondary | ICD-10-CM

## 2022-08-17 DIAGNOSIS — A419 Sepsis, unspecified organism: Secondary | ICD-10-CM | POA: Diagnosis not present

## 2022-08-17 DIAGNOSIS — Z7982 Long term (current) use of aspirin: Secondary | ICD-10-CM

## 2022-08-17 DIAGNOSIS — Z86008 Personal history of in-situ neoplasm of other site: Secondary | ICD-10-CM

## 2022-08-17 DIAGNOSIS — Z8551 Personal history of malignant neoplasm of bladder: Secondary | ICD-10-CM

## 2022-08-17 DIAGNOSIS — Z9981 Dependence on supplemental oxygen: Secondary | ICD-10-CM

## 2022-08-17 DIAGNOSIS — D72825 Bandemia: Secondary | ICD-10-CM

## 2022-08-17 DIAGNOSIS — J440 Chronic obstructive pulmonary disease with acute lower respiratory infection: Secondary | ICD-10-CM | POA: Diagnosis not present

## 2022-08-17 DIAGNOSIS — J441 Chronic obstructive pulmonary disease with (acute) exacerbation: Secondary | ICD-10-CM | POA: Diagnosis not present

## 2022-08-17 DIAGNOSIS — J9621 Acute and chronic respiratory failure with hypoxia: Secondary | ICD-10-CM | POA: Diagnosis not present

## 2022-08-17 DIAGNOSIS — H547 Unspecified visual loss: Secondary | ICD-10-CM

## 2022-08-17 DIAGNOSIS — N179 Acute kidney failure, unspecified: Secondary | ICD-10-CM | POA: Diagnosis not present

## 2022-08-17 DIAGNOSIS — I10 Essential (primary) hypertension: Secondary | ICD-10-CM | POA: Diagnosis not present

## 2022-08-17 NOTE — Telephone Encounter (Signed)
Requested prior auth for patient's procedure on 08/15/22 @ 8:27 am and it is pending higher level of review by MD. Date has been extended to the end of the month.

## 2022-08-20 ENCOUNTER — Encounter (INDEPENDENT_AMBULATORY_CARE_PROVIDER_SITE_OTHER): Payer: Self-pay

## 2022-08-23 ENCOUNTER — Encounter (INDEPENDENT_AMBULATORY_CARE_PROVIDER_SITE_OTHER): Payer: Self-pay

## 2022-08-23 ENCOUNTER — Telehealth (INDEPENDENT_AMBULATORY_CARE_PROVIDER_SITE_OTHER): Payer: Self-pay

## 2022-08-23 NOTE — Telephone Encounter (Signed)
A peer to peer has been scheduled with Eulogio Ditch NP on 08/28/22 at 9:20 am. Per the denial I was told that the Auth had been denied due to not enough documentation to meet criteria. The Auth was initially expedited then reduced to standard.

## 2022-08-28 ENCOUNTER — Telehealth (INDEPENDENT_AMBULATORY_CARE_PROVIDER_SITE_OTHER): Payer: Self-pay

## 2022-08-28 ENCOUNTER — Ambulatory Visit: Payer: Medicare HMO | Admitting: Dermatology

## 2022-08-28 NOTE — Telephone Encounter (Signed)
Spoke with the patient and she is scheduled with Dr. Lucky Cowboy on 09/03/22 with a 9:30 am [facility]. Pre-procedure instructions were discussed and will be mailed.

## 2022-08-29 ENCOUNTER — Encounter (INDEPENDENT_AMBULATORY_CARE_PROVIDER_SITE_OTHER): Payer: Self-pay

## 2022-09-03 ENCOUNTER — Ambulatory Visit
Admission: RE | Admit: 2022-09-03 | Discharge: 2022-09-03 | Disposition: A | Discharge status: Home / Self Care | Payer: Medicare HMO | Attending: Vascular Surgery | Admitting: Vascular Surgery

## 2022-09-03 ENCOUNTER — Encounter
Admission: RE | Disposition: A | Discharge status: Home / Self Care | Payer: Self-pay | Source: Home / Self Care | Attending: Vascular Surgery

## 2022-09-03 ENCOUNTER — Other Ambulatory Visit: Payer: Self-pay

## 2022-09-03 ENCOUNTER — Encounter: Payer: Self-pay | Admitting: Vascular Surgery

## 2022-09-03 DIAGNOSIS — I70212 Atherosclerosis of native arteries of extremities with intermittent claudication, left leg: Secondary | ICD-10-CM

## 2022-09-03 DIAGNOSIS — Z87891 Personal history of nicotine dependence: Secondary | ICD-10-CM | POA: Diagnosis not present

## 2022-09-03 DIAGNOSIS — Z95828 Presence of other vascular implants and grafts: Secondary | ICD-10-CM | POA: Diagnosis not present

## 2022-09-03 DIAGNOSIS — I739 Peripheral vascular disease, unspecified: Secondary | ICD-10-CM

## 2022-09-03 HISTORY — PX: LOWER EXTREMITY ANGIOGRAPHY: CATH118251

## 2022-09-03 LAB — BUN: BUN: 18 mg/dL (ref 8–23)

## 2022-09-03 LAB — CREATININE, SERUM
Creatinine, Ser: 0.67 mg/dL (ref 0.44–1.00)
GFR, Estimated: >60 mL/min (ref >60)

## 2022-09-03 SURGERY — LOWER EXTREMITY ANGIOGRAPHY
Anesthesia: Moderate Sedation | Site: Leg Lower | Laterality: Left

## 2022-09-03 MED ORDER — FENTANYL CITRATE (PF) 100 MCG/2ML IJ SOLN
INTRAMUSCULAR | Status: DC | PRN
  Administered 2022-09-03: 50 ug via INTRAVENOUS

## 2022-09-03 MED ORDER — FENTANYL CITRATE PF 50 MCG/ML IJ SOSY
PREFILLED_SYRINGE | INTRAMUSCULAR | Status: AC
  Filled 2022-09-03: qty 1

## 2022-09-03 MED ORDER — DIPHENHYDRAMINE HCL 50 MG/ML IJ SOLN: 50.0000 mg | Freq: Once | INTRAMUSCULAR | Status: DC | PRN

## 2022-09-03 MED ORDER — CEFAZOLIN SODIUM-DEXTROSE 2-4 GM/100ML-% IV SOLN
INTRAVENOUS | Status: AC
  Administered 2022-09-03: 2 g via INTRAVENOUS
  Filled 2022-09-03: qty 100

## 2022-09-03 MED ORDER — IODIXANOL 320 MG/ML IV SOLN
INTRAVENOUS | Status: DC | PRN
  Administered 2022-09-03: 60 mL

## 2022-09-03 MED ORDER — MIDAZOLAM HCL 2 MG/ML PO SYRP: 8.0000 mg | ORAL_SOLUTION | Freq: Once | ORAL | Status: DC | PRN

## 2022-09-03 MED ORDER — CEFAZOLIN SODIUM-DEXTROSE 2-4 GM/100ML-% IV SOLN: 2.0000 g | INTRAVENOUS | Status: AC

## 2022-09-03 MED ORDER — HEPARIN SODIUM (PORCINE) 1000 UNIT/ML IJ SOLN
INTRAMUSCULAR | Status: AC
  Filled 2022-09-03: qty 10

## 2022-09-03 MED ORDER — HYDROMORPHONE HCL 1 MG/ML IJ SOLN: 1.0000 mg | Freq: Once | INTRAMUSCULAR | Status: DC | PRN

## 2022-09-03 MED ORDER — ONDANSETRON HCL 4 MG/2ML IJ SOLN: 4.0000 mg | Freq: Four times a day (QID) | INTRAMUSCULAR | Status: DC | PRN

## 2022-09-03 MED ORDER — HEPARIN SODIUM (PORCINE) 1000 UNIT/ML IJ SOLN
INTRAMUSCULAR | Status: DC | PRN
  Administered 2022-09-03: 4000 [IU] via INTRAVENOUS

## 2022-09-03 MED ORDER — MIDAZOLAM HCL 2 MG/2ML IJ SOLN
INTRAMUSCULAR | Status: AC
  Filled 2022-09-03: qty 2

## 2022-09-03 MED ORDER — FAMOTIDINE 20 MG PO TABS: 40.0000 mg | ORAL_TABLET | Freq: Once | ORAL | Status: DC | PRN

## 2022-09-03 MED ORDER — MIDAZOLAM HCL 2 MG/2ML IJ SOLN
INTRAMUSCULAR | Status: DC | PRN
  Administered 2022-09-03: 1 mg via INTRAVENOUS

## 2022-09-03 MED ORDER — METHYLPREDNISOLONE SODIUM SUCC 125 MG IJ SOLR: 125.0000 mg | Freq: Once | INTRAMUSCULAR | Status: DC | PRN

## 2022-09-03 MED ORDER — SODIUM CHLORIDE 0.9 % IV SOLN: INTRAVENOUS | Status: DC

## 2022-09-03 SURGICAL SUPPLY — 21 items
BALLN LUTONIX 5X220X130 (BALLOONS) ×1
BALLN LUTONIX DCB 5X60X130 (BALLOONS) ×1
BALLN LUTONIX DCB 6X40X130 (BALLOONS) ×1
BALLOON LUTONIX 5X220X130 (BALLOONS) IMPLANT
BALLOON LUTONIX DCB 5X60X130 (BALLOONS) IMPLANT
BALLOON LUTONIX DCB 6X40X130 (BALLOONS) IMPLANT
CATH ANGIO 5F PIGTAIL 65CM (CATHETERS) IMPLANT
CATH VERT 5X100 (CATHETERS) IMPLANT
COVER DRAPE FLUORO 36X44 (DRAPES) IMPLANT
COVER PROBE U/S 5X48 (MISCELLANEOUS) IMPLANT
DEVICE STARCLOSE SE CLOSURE (Vascular Products) IMPLANT
KIT ENCORE 26 ADVANTAGE (KITS) IMPLANT
PACK ANGIOGRAPHY (CUSTOM PROCEDURE TRAY) ×1 IMPLANT
SHEATH BRITE TIP 5FRX11 (SHEATH) IMPLANT
SHEATH RAABE 6FR (SHEATH) IMPLANT
STENT LIFESTENT 5F 6X40X135 (Permanent Stent) IMPLANT
STENT LIFESTENT 5F 6X60X135 (Permanent Stent) IMPLANT
SYR MEDRAD MARK 7 150ML (SYRINGE) IMPLANT
TUBING CONTRAST HIGH PRESS 72 (TUBING) IMPLANT
WIRE GUIDERIGHT .035X150 (WIRE) IMPLANT
WIRE SUPRACORE 300CM (MISCELLANEOUS) IMPLANT

## 2022-09-03 NOTE — Op Note (Signed)
[facility]  Percutaneous Study/Intervention Procedural Note   Date of Surgery: 09/03/2022  Surgeon(s):Janett Kamath    Assistants:none  Pre-operative Diagnosis: PAD with claudication LLE as well as PAD with upcoming left leg surgery  Post-operative diagnosis:  Same  Procedure(s) Performed:             1.  Ultrasound guidance for vascular access right femoral artery             2.  Catheter placement into left common femoral artery from right femoral approach             3.  Aortogram and selective left lower extremity angiogram             4.  Percutaneous transluminal angioplasty of left SFA and popliteal arteries with 5 mm x 22 cm length Lutonix drug coated balloon             5.  Stent placement with 6 mm x 6 cm and 6 mm x 4 cm Lifestents to the distal SFA and above knee popliteal arteries  6.  Percutaneous transluminal angioplasty of left external iliac artery with 6 mm diameter 4 cm length Lutonix drug-coated angioplasty balloon             7.  StarClose closure device right femoral artery  EBL: 5 cc  Contrast: 60 cc  Fluoro Time: 7 minutes  Moderate Conscious Sedation Time: approximately 43 minutes using 1 mg of Versed and 50 mcg of Fentanyl              Indications:  Patient is a 89 y.o.female with short distance claudication of the left leg as well as an upcoming surgery on the left leg with reduced perfusion. The patient has noninvasive study showing a reduced ABI on the left. The patient is brought in for angiography for further evaluation and potential treatment.  Due to the limb threatening nature of the situation, angiogram was performed for attempted limb salvage. The patient is aware that if the procedure fails, amputation would be expected.  The patient also understands that even with successful revascularization, amputation may still be required due to the severity of the situation.  Risks and benefits are discussed and informed consent is  obtained.   Procedure:  The patient was identified and appropriate procedural time out was performed.  The patient was then placed supine on the table and prepped and draped in the usual sterile fashion. Moderate conscious sedation was administered during a face to face encounter with the patient throughout the procedure with my supervision of the RN administering medicines and monitoring the patient's vital signs, pulse oximetry, telemetry and mental status throughout from the start of the procedure until the patient was taken to the recovery room. Ultrasound was used to evaluate the right common femoral artery.  It was fairly diseased.  A digital ultrasound image was acquired.  A Seldinger needle was used to access the right common femoral artery under direct ultrasound guidance and a permanent image was performed.  A 0.035 J wire was advanced without resistance and a 5Fr sheath was placed.  Pigtail catheter was placed into the aorta and an AP aortogram was performed. This demonstrated normal renal arteries and normal aorta.  The right iliac stent was patent.  The left common iliac artery had a mild stenosis that was less than 50%.  The distal left external iliac artery had about an 80% napkin ring like stenosis. I then crossed the aortic  bifurcation and advanced to the left femoral head. Selective left lower extremity angiogram was then performed. This demonstrated a high femoral bifurcation not far below the external iliac artery stenosis.  The profunda femoris artery was heavily diseased with a greater than 85% stenosis.  The SFA and popliteal arteries were diffusely diseased.  The SFA and the proximal to mid segment had several areas of borderline greater than 50% stenosis.  At [facility] canal, there was about a 90% stenosis and then in the above-knee popliteal artery there was about a 95% stenosis.  The below-knee popliteal artery normalized and the peroneal artery was the best runoff distally.  The anterior  tibial artery was patent proximally but was diminutive and occluded distally.  The posterior tibial artery was occluded. It was felt that it was in the patient's best interest to proceed with intervention after these images to avoid a second procedure and a larger amount of contrast and fluoroscopy based off of the findings from the initial angiogram. The patient was systemically heparinized and a 6 [place] Rabie sheath was then placed over the [employer] wire. I then used a Kumpe catheter and the advantage wire to cross the multiple areas of stenosis in the SFA and popliteal arteries and confirm intraluminal flow in the below-knee popliteal artery.  The wire was then parked in the anterior tibial artery.  A 5 mm diameter by 22 cm length Lutonix drug-coated angioplasty balloon was inflated in the left SFA and popliteal arteries.  It was taken up to 12 atm for 1 minute.  Completion imaging showed only above-knee popliteal artery still to have significant stenosis and I initially treated this with a 6 mm diameter by 6 cm life stent but there remained a lesion just below this and so an additional 6 mm diameter by 4 cm length life stent was placed.  These were postdilated with 5 mm balloon with excellent angiographic ablation result and less than 10% residual stenosis.  The diffuse proximal SFA disease did not appear as severe and I elected not to treat this.  I did address the external iliac artery lesion with a 6 mm diameter by 4 cm length Lutonix drug-coated angioplasty balloon inflated to 10 atm for 1 minute.  Completion imaging showed only about a 20 to 25% residual stenosis in this location.  I felt that any further treatment would likely require a femoral endarterectomy to address her profunda disease as well as the proximal SFA but she should be markedly improved from her interventions today. I elected to terminate the procedure. The sheath was removed and StarClose closure device was deployed in the right  femoral artery with excellent hemostatic result. The patient was taken to the recovery room in stable condition having tolerated the procedure well.  Findings:               Aortogram:  This demonstrated normal renal arteries and normal aorta.  The right iliac stent was patent.  The left common iliac artery had a mild stenosis that was less than 50%.  The distal left external iliac artery had about an 80% napkin ring like stenosis             Left Lower Extremity:  This demonstrated a high femoral bifurcation not far below the external iliac artery stenosis.  The profunda femoris artery was heavily diseased with a greater than 85% stenosis.  The SFA and popliteal arteries were diffusely diseased.  The SFA and the proximal to mid segment had  several areas of borderline greater than 50% stenosis.  At [facility] canal, there was about a 90% stenosis and then in the above-knee popliteal artery there was about a 95% stenosis.  The below-knee popliteal artery normalized and the peroneal artery was the best runoff distally.  The anterior tibial artery was patent proximally but was diminutive and occluded distally.  The posterior tibial artery was occluded.   Disposition: Patient was taken to the recovery room in stable condition having tolerated the procedure well.  Complications: None  Leotis Pain 09/03/2022 11:57 AM   This note was created with Dragon Medical transcription system. Any errors in dictation are purely unintentional.

## 2022-09-03 NOTE — H&P (Signed)
[facility] Admission History & Physical  MRN : [id number]  Misty Ferguson is a 89 y.o. (11/04/1933) female who presents with chief complaint of No chief complaint on file. Marland Kitchen  History of Present Illness: Patient presents today for left lower extremity angiogram with possible revascularization.  Has previously undergone right lower extremity revascularization last year.  Has significant pain in the left lower extremity and also has an upcoming surgery on the left leg with reduced ABI of 0.7 range.  No current facility-administered medications for this encounter.    Past Medical History:  Diagnosis Date   AK (actinic keratosis) 12/08/2020   right pretibia inferior bx proven, LN2 01/10/21   Burping    Chronic airway obstruction, not elsewhere classified    Dyspnea    Dysrhythmia    Macular degeneration (senile) of retina, unspecified    Malignant neoplasm of urinary bladder ([place]) 03/2019   Partial bladder resection and Rad tx's.    Obstructive chronic bronchitis with exacerbation ([place])    Osteoporosis, unspecified    Oxygen deficiency    Personal history of peptic ulcer disease    SCC (squamous cell carcinoma) 12/08/2020   right pretibia superior, EDC 01/10/21   SCC (squamous cell carcinoma) 07/03/2022   left medial calf needs ED&C   Squamous cell carcinoma in situ (SCCIS) 07/03/2022   right pretibia sccis needs ED&C   Tobacco use disorder    Unspecified essential hypertension    Unspecified glaucoma(365.9)     Past Surgical History:  Procedure Laterality Date   CATARACT EXTRACTION W/ INTRAOCULAR LENS  IMPLANT, BILATERAL     CYSTOSCOPY WITH STENT PLACEMENT Left 05/22/2019   Procedure: CYSTOSCOPY WITH STENT PLACEMENT;  Surgeon: Billey Co, MD;  Location: [facility];  Service: Urology;  Laterality: Left;   ELBOW FRACTURE SURGERY Left    LOWER EXTREMITY ANGIOGRAPHY Right 11/20/2021   Procedure: Lower Extremity Angiography;  Surgeon: Algernon Huxley, MD;   Location: [facility];  Service: Cardiovascular;  Laterality: Right;   TRANSURETHRAL RESECTION OF BLADDER TUMOR N/A 05/22/2019   Procedure: TRANSURETHRAL RESECTION OF BLADDER TUMOR (TURBT);  Surgeon: Billey Co, MD;  Location: [facility];  Service: Urology;  Laterality: N/A;     Social History   Tobacco Use   Smoking status: Former    Packs/day: 1.30    Years: 45.00    Total pack years: 58.50    Types: Cigarettes    Quit date: 1998    Years since quitting: 26.0   Smokeless tobacco: Never  Vaping Use   Vaping Use: Never used  Substance Use Topics   Alcohol use: Yes    Alcohol/week: 14.0 standard drinks of alcohol    Types: 14 Glasses of wine per week    Comment: 1-2 wine daily   Drug use: No     Family History  Adopted: Yes    No Known Allergies   REVIEW OF SYSTEMS (Negative unless checked)  Constitutional: '[]'$ Weight loss  '[]'$ Fever  '[]'$ Chills Cardiac: '[]'$ Chest pain   '[]'$ Chest pressure   '[]'$ Palpitations   '[]'$ Shortness of breath when laying flat   '[]'$ Shortness of breath at rest   '[]'$ Shortness of breath with exertion. Vascular:  '[x]'$ Pain in legs with walking   '[]'$ Pain in legs at rest   '[]'$ Pain in legs when laying flat   '[]'$ Claudication   '[]'$ Pain in feet when walking  '[x]'$ Pain in feet at rest  '[]'$ Pain in feet when laying flat   '[]'$ History of DVT   '[]'$   Phlebitis   '[]'$ Swelling in legs   '[]'$ Varicose veins   '[]'$ Non-healing ulcers Pulmonary:   '[]'$ Uses home oxygen   '[]'$ Productive cough   '[]'$ Hemoptysis   '[]'$ Wheeze  '[]'$ COPD   '[]'$ Asthma Neurologic:  '[]'$ Dizziness  '[]'$ Blackouts   '[]'$ Seizures   '[]'$ History of stroke   '[]'$ History of TIA  '[]'$ Aphasia   '[]'$ Temporary blindness   '[]'$ Dysphagia   '[]'$ Weakness or numbness in arms   '[]'$ Weakness or numbness in legs Musculoskeletal:  '[x]'$ Arthritis   '[]'$ Joint swelling   '[x]'$ Joint pain   '[]'$ Low back pain Hematologic:  '[]'$ Easy bruising  '[]'$ Easy bleeding   '[]'$ Hypercoagulable state   '[]'$ Anemic  '[]'$ Hepatitis Gastrointestinal:  '[]'$ Blood in stool   '[]'$ Vomiting blood  '[]'$ Gastroesophageal  reflux/heartburn   '[]'$ Difficulty swallowing. Genitourinary:  '[]'$ Chronic kidney disease   '[]'$ Difficult urination  '[]'$ Frequent urination  '[]'$ Burning with urination   '[]'$ Blood in urine Skin:  '[]'$ Rashes   '[]'$ Ulcers   '[]'$ Wounds Psychological:  '[]'$ History of anxiety   '[]'$  History of major depression.  Physical Examination  There were no vitals filed for this visit. There is no height or weight on file to calculate BMI. Gen: WD/WN, NAD Head: [place]/AT, No temporalis wasting. Ear/Nose/Throat: Hearing grossly intact, nares w/o erythema or drainage, oropharynx w/o Erythema/Exudate,  Eyes: Conjunctiva clear, sclera non-icteric Neck: Trachea midline.  No JVD.  Pulmonary:  Good air movement, respirations not labored, no use of accessory muscles.  Cardiac: RRR, normal S1, S2. Vascular:  Vessel Right Left  Radial Palpable Palpable                          PT 2+ Palpable 1+ Palpable  DP 1+ Palpable 1+ Palpable    Musculoskeletal: M/S 5/5 throughout.  Extremities without ischemic changes.  No deformity or atrophy.  Neurologic: Sensation grossly intact in extremities.  Symmetrical.  Speech is fluent. Motor exam as listed above. Psychiatric: Judgment intact, Mood & affect appropriate for pt's clinical situation. Dermatologic: No rashes or ulcers noted.  No cellulitis or open wounds.      CBC Lab Results  Component Value Date   WBC 3.1 (L) 07/26/2022   HGB 9.3 (L) 07/26/2022   HCT 28.8 (L) 07/26/2022   MCV 96.3 07/26/2022   PLT 167 07/26/2022    BMET    Component Value Date/Time   NA 139 07/27/2022 0532   NA 141 06/10/2019 1324   K 3.9 07/27/2022 0532   K 4.1 07/23/2012 1536   CL 104 07/27/2022 0532   CO2 28 07/27/2022 0532   GLUCOSE 146 (H) 07/27/2022 0532   BUN 27 (H) 07/27/2022 0532   BUN 15 06/10/2019 1324   CREATININE 0.76 07/27/2022 0532   CREATININE 1.26 (H) 12/23/2012 1440   CALCIUM 8.5 (L) 07/27/2022 0532   GFRNONAA >60 07/27/2022 0532   GFRAA 62 06/10/2019 1324   CrCl cannot  be calculated (Patient's most recent lab result is older than the maximum 21 days allowed.).  COAG Lab Results  Component Value Date   INR 1.1 07/25/2022   INR 0.88 09/08/2017   INR 1.16 08/18/2010    Radiology No results found.   Assessment/Plan 1.  PAD with claudication left lower extremity and upcoming surgery on the left lower extremity.  For angiogram and possible revascularization today.  Risks and benefits discussed.    Leotis Pain, MD  09/03/2022 9:39 AM

## 2022-09-13 ENCOUNTER — Encounter (INDEPENDENT_AMBULATORY_CARE_PROVIDER_SITE_OTHER): Payer: Self-pay

## 2022-09-14 ENCOUNTER — Other Ambulatory Visit (INDEPENDENT_AMBULATORY_CARE_PROVIDER_SITE_OTHER): Payer: Self-pay | Admitting: Nurse Practitioner

## 2022-09-14 ENCOUNTER — Encounter (INDEPENDENT_AMBULATORY_CARE_PROVIDER_SITE_OTHER): Payer: Self-pay

## 2022-09-14 MED ORDER — FUROSEMIDE 20 MG PO TABS: 20.0000 mg | ORAL_TABLET | Freq: Every day | ORAL | 0 refills | Status: DC

## 2022-09-17 NOTE — Telephone Encounter (Signed)
Message has been address

## 2022-09-25 ENCOUNTER — Ambulatory Visit: Payer: Medicare HMO | Admitting: Dermatology

## 2022-09-25 ENCOUNTER — Encounter: Payer: Self-pay | Admitting: Dermatology

## 2022-09-25 VITALS — BP 155/48 | HR 85

## 2022-09-25 DIAGNOSIS — Z85828 Personal history of other malignant neoplasm of skin: Secondary | ICD-10-CM | POA: Diagnosis not present

## 2022-09-25 DIAGNOSIS — D0471 Carcinoma in situ of skin of right lower limb, including hip: Secondary | ICD-10-CM

## 2022-09-25 DIAGNOSIS — Z8589 Personal history of malignant neoplasm of other organs and systems: Secondary | ICD-10-CM

## 2022-09-25 DIAGNOSIS — D099 Carcinoma in situ, unspecified: Secondary | ICD-10-CM

## 2022-09-25 MED ORDER — IMIQUIMOD 5 % EX CREA: TOPICAL_CREAM | CUTANEOUS | 0 refills | Status: DC

## 2022-09-25 NOTE — Patient Instructions (Addendum)
Right leg, skin cancer site: Start Imiquimod cream: Apply thin layer to skin cancer and small area of surrounding skin 5 times per week for 6 weeks.  Apply Vaseline or Aquaphor twice daily on weekends.   Wash daily with Hibiclens.   Reviewed expected reaction when using imiquimod cream, including irritation and mild inflammation and risk of erosions or more severe inflammation. Reviewed not to apply this in an area larger than 4 x 4 inches to avoid flu-like symptoms. Only a thin layer is required. Reviewed if too much irritation occurs, ensure application of only a thin layer and decrease frequency slightly to achieve a tolerable level of inflammation.    Due to recent changes in healthcare laws, you may see results of your pathology and/or laboratory studies on MyChart before the doctors have had a chance to review them. We understand that in some cases there may be results that are confusing or concerning to you. Please understand that not all results are received at the same time and often the doctors may need to interpret multiple results in order to provide you with the best plan of care or course of treatment. Therefore, we ask that you please give [place] 2 business days to thoroughly review all your results before contacting the office for clarification. Should we see a critical lab result, you will be contacted sooner.   If You Need Anything After Your Visit  If you have any questions or concerns for your doctor, please call our main line at [phone] and press option 4 to reach your doctor's medical assistant. If no one answers, please leave a voicemail as directed and we will return your call as soon as possible. Messages left after 4 pm will be answered the following business day.   You may also send [place] a message via [place]. We typically respond to MyChart messages within 1-2 business days.  For prescription refills, please ask your pharmacy to contact our office. Our fax number is  [phone].  If you have an urgent issue when the clinic is closed that cannot wait until the next business day, you can page your doctor at the number below.    Please note that while we do our best to be available for urgent issues outside of office hours, we are not available 24/7.   If you have an urgent issue and are unable to reach [place], you may choose to seek medical care at your doctor's office, retail clinic, urgent care center, or emergency room.  If you have a medical emergency, please immediately call 911 or go to the emergency department.  Pager Numbers  - Dr. Nehemiah Massed: [phone]  - Dr. Laurence Ferrari: [phone]  - Dr. Nicole Kindred: [phone]  In the event of inclement weather, please call our main line at [phone] for an update on the status of any delays or closures.  Dermatology Medication Tips: Please keep the boxes that topical medications come in in order to help keep track of the instructions about where and how to use these. Pharmacies typically print the medication instructions only on the boxes and not directly on the medication tubes.   If your medication is too expensive, please contact our office at [phone] option 4 or send [place] a message through [place].   We are unable to tell what your co-pay for medications will be in advance as this is different depending on your insurance coverage. However, we may be able to find a substitute medication at lower cost or fill out paperwork  to get insurance to cover a needed medication.   If a prior authorization is required to get your medication covered by your insurance company, please allow [place] 1-2 business days to complete this process.  Drug prices often vary depending on where the prescription is filled and some pharmacies may offer cheaper prices.  The website www.goodrx.com contains coupons for medications through different pharmacies. The prices here do not account for what the cost may be with help from  insurance (it may be cheaper with your insurance), but the website can give you the price if you did not use any insurance.  - You can print the associated coupon and take it with your prescription to the pharmacy.  - You may also stop by our office during regular business hours and pick up a GoodRx coupon card.  - If you need your prescription sent electronically to a different pharmacy, notify our office through [facility] or by phone at [phone] option 4.     Si Usted Necesita Algo Despus de Su Visita  Tambin puede enviarnos un mensaje a travs de Pharmacist, community. Por lo general respondemos a los mensajes de MyChart en el transcurso de 1 a 2 das hbiles.  Para renovar recetas, por favor pida a su farmacia que se ponga en contacto con nuestra oficina. Harland Dingwall de fax es [place] [phone].  Si tiene un asunto urgente cuando la clnica est cerrada y que no puede esperar hasta el siguiente da hbil, puede llamar/localizar a su doctor(a) al nmero que aparece a continuacin.   Por favor, tenga en cuenta que aunque hacemos todo lo posible para estar disponibles para asuntos urgentes fuera del horario de [place], no estamos disponibles las 24 horas del da, los 7 das de la [place].   Si tiene un problema urgente y no puede comunicarse con nosotros, puede optar por buscar atencin mdica  en el consultorio de su doctor(a), en una clnica privada, en un centro de atencin urgente o en una sala de emergencias.  Si tiene Engineering geologist, por favor llame inmediatamente al 911 o vaya a la sala de emergencias.  Nmeros de bper  - Dr. Nehemiah Massed: [phone]  - Dra. Moye: [phone]  - Dra. Nicole Kindred: [phone]  En caso de inclemencias del [place], por favor llame a Johnsie Kindred principal al [phone] para una actualizacin sobre el [place] de cualquier retraso o cierre.  Consejos para la medicacin en dermatologa: Por favor, guarde las cajas en las que vienen los  medicamentos de uso tpico para ayudarle a seguir las instrucciones sobre dnde y cmo usarlos. Las farmacias generalmente imprimen las instrucciones del medicamento slo en las cajas y no directamente en los tubos del [place].   Si su medicamento es muy caro, por favor, pngase en contacto con Zigmund Daniel llamando al [phone] y presione la opcin 4 o envenos un mensaje a travs de Pharmacist, community.   No podemos decirle cul ser su copago por los medicamentos por adelantado ya que esto es diferente dependiendo de la cobertura de su seguro. Sin embargo, es posible que podamos encontrar un medicamento sustituto a Electrical engineer un formulario para que el seguro cubra el medicamento que se considera necesario.   Si se requiere una autorizacin previa para que su compaa de seguros [place] su medicamento, por favor permtanos de 1 a 2 das hbiles para completar este proceso.  Los precios de los medicamentos varan con frecuencia dependiendo del Environmental consultant de dnde se surte la receta y [place]  pueden ofrecer precios ms baratos.  El sitio web www.goodrx.com tiene cupones para medicamentos de Airline pilot. Los precios aqu no tienen en cuenta lo que podra costar con la ayuda del seguro (puede ser ms barato con su seguro), pero el sitio web puede darle el precio si no utiliz Research scientist (physical sciences).  - Puede imprimir el cupn correspondiente y llevarlo con su receta a la farmacia.  - Tambin puede pasar por nuestra oficina durante el horario de atencin regular y Charity fundraiser una tarjeta de cupones de GoodRx.  - Si necesita que su receta se enve electrnicamente a una farmacia diferente, informe a nuestra oficina a travs de MyChart de [place] o por telfono llamando al [phone] y presione la opcin 4.

## 2022-09-25 NOTE — Progress Notes (Signed)
   Follow-Up Visit   Subjective  Misty Ferguson is a 89 y.o. female who presents for the following: Skin Cancer (Here for treatment of squamous cell carcinomas of B/L lower legs. Has appointment next week with vascular surgeon for f/u of venoplasty of right leg. Will have venoplasty of left leg in ~3 weeks).  The patient has spots, moles and lesions to be evaluated, some may be new or changing and the patient has concerns that these could be cancer.   The following portions of the chart were reviewed this encounter and updated as appropriate:  Tobacco  Allergies  Meds  Problems  Med Hx  Surg Hx  Fam Hx      Review of Systems: No other skin or systemic complaints except as noted in HPI or Assessment and Plan.   Objective  Well appearing patient in no apparent distress; mood and affect are within normal limits.  A focused examination was performed including B/L legs. Relevant physical exam findings are noted in the Assessment and Plan.  Right Pretibial Pink plaque with central escar     left calf scar   Assessment & Plan  Squamous cell carcinoma in situ Right Pretibial  imiquimod (ALDARA) 5 % cream Apply thin layer to skin cancer and small area of surrounding skin 5 times per week for 6 weeks.  Chronic and persistent condition with duration or expected duration over one year without treatment.   Had originally planned [facility] but given the size, expect poor wound healing. She has a history of angioplasty of this leg.   Discussed options including topical therapy with 74fuorouracil chemotherapy cream vs imiquimod, radiation, Mohs, ED&C with Unna boot (would need clearance from vascular surgery).  Start Imiquimod cream Apply thin layer to skin cancer and small area of surrounding skin 5 times per week for 6 weeks.  Apply Vaseline or Aquaphor twice daily on weekends.   Wash daily with Hibiclens.    Reviewed expected reaction when using imiquimod cream, including  irritation and mild inflammation and risk of erosions or more severe inflammation. Reviewed not to apply this in an area larger than 4 x 4 inches to avoid flu-like symptoms. Only a thin layer is required. Reviewed if too much irritation occurs, ensure application of only a thin layer and decrease frequency slightly to achieve a tolerable level of inflammation.    Has appointment next week with vascular surgeon for f/u of angioplasty of left leg. Would want clearance from vascular surgeon before using unna boot.   History of squamous cell carcinoma left calf  No residual visible on exam today. Discussed options of repeat shave biopsy, ED&C, topical therapy vs monitoring. Advised that there may be residual skin cancer cells that could grow back.   Patient deferred treatment at this time.   Will watch closely for recurrence.   Advised patient can also treat this area with Imiquimod 5 times per week for 6 weeks if she feels up for this.   Return for SCC/Imiquimod treatment follow up in 8-10 weeks.  I, JEmelia Salisbury CMA, am acting as scribe for VForest Gleason MD.  Documentation: I have reviewed the above documentation for accuracy and completeness, and I agree with the above.  VForest Gleason MD

## 2022-10-02 ENCOUNTER — Other Ambulatory Visit (INDEPENDENT_AMBULATORY_CARE_PROVIDER_SITE_OTHER): Payer: Self-pay | Admitting: Vascular Surgery

## 2022-10-02 DIAGNOSIS — I739 Peripheral vascular disease, unspecified: Secondary | ICD-10-CM

## 2022-10-04 ENCOUNTER — Ambulatory Visit (INDEPENDENT_AMBULATORY_CARE_PROVIDER_SITE_OTHER): Payer: Medicare HMO

## 2022-10-04 ENCOUNTER — Encounter (INDEPENDENT_AMBULATORY_CARE_PROVIDER_SITE_OTHER): Payer: Self-pay | Admitting: Nurse Practitioner

## 2022-10-04 ENCOUNTER — Ambulatory Visit (INDEPENDENT_AMBULATORY_CARE_PROVIDER_SITE_OTHER): Payer: Medicare HMO | Admitting: Nurse Practitioner

## 2022-10-04 VITALS — BP 142/70 | HR 87 | Resp 16

## 2022-10-04 DIAGNOSIS — Z9889 Other specified postprocedural states: Secondary | ICD-10-CM

## 2022-10-04 DIAGNOSIS — E7800 Pure hypercholesterolemia, unspecified: Secondary | ICD-10-CM | POA: Diagnosis not present

## 2022-10-04 DIAGNOSIS — I739 Peripheral vascular disease, unspecified: Secondary | ICD-10-CM | POA: Diagnosis not present

## 2022-10-04 DIAGNOSIS — I10 Essential (primary) hypertension: Secondary | ICD-10-CM | POA: Diagnosis not present

## 2022-10-04 NOTE — Progress Notes (Signed)
Subjective:    Patient ID: [name], female    DOB: 21-Sep-1933, 89 y.o.   MRN: [id number] No chief complaint on file.   The patient returns to the office for followup and review status post angiogram with intervention on 09/03/22.   Procedure: Procedure(s) Performed:             1.  Ultrasound guidance for vascular access right femoral artery             2.  Catheter placement into left common femoral artery from right femoral approach             3.  Aortogram and selective left lower extremity angiogram             4.  Percutaneous transluminal angioplasty of left SFA and popliteal arteries with 5 mm x 22 cm length Lutonix drug coated balloon             5.  Stent placement with 6 mm x 6 cm and 6 mm x 4 cm Lifestents to the distal SFA and above knee popliteal arteries             6.  Percutaneous transluminal angioplasty of left external iliac artery with 6 mm diameter 4 cm length Lutonix drug-coated angioplasty balloon             7.  StarClose closure device right femoral artery   The patient notes improvement in the lower extremity symptoms. No interval shortening of the patient's claudication distance or rest pain symptoms. No new ulcers or wounds have occurred since the last visit.  She initially had some swelling post angiogram but the symptoms resolved  There have been no significant changes to the patient's overall health care.  No documented history of amaurosis fugax or recent TIA symptoms. There are no recent neurological changes noted. No documented history of DVT, PE or superficial thrombophlebitis. The patient denies recent episodes of angina or shortness of breath.   ABI's Rt=0.88 and Lt=0.91  (previous ABI's Rt=0.92 and Lt=0.74) Duplex US of the bilateral tibial arteries show biphasic waveforms with normal toe waveforms bilaterally.  TBI's are normal bilaterally.    Review of Systems  Cardiovascular:  Positive for leg swelling.  Neurological:  Positive for  weakness.  Hematological:  Bruises/bleeds easily.  All other systems reviewed and are negative.      Objective:   Physical Exam Vitals reviewed.  HENT:     Head: Normocephalic.  Cardiovascular:     Rate and Rhythm: Normal rate.     Pulses:          Dorsalis pedis pulses are detected w/ Doppler on the right side and detected w/ Doppler on the left side.       Posterior tibial pulses are detected w/ Doppler on the right side.  Pulmonary:     Effort: Pulmonary effort is normal.  Musculoskeletal:     Left lower leg: 1+ Edema present.  Skin:    General: Skin is warm and dry.  Neurological:     Mental Status: She is alert and oriented to person, place, and time.  Psychiatric:        Mood and Affect: Mood normal.        Behavior: Behavior normal.        Thought Content: Thought content normal.        Judgment: Judgment normal.     There were no vitals taken for this visit.  Past Medical  History:  Diagnosis Date   AK (actinic keratosis) 12/08/2020   right pretibia inferior bx proven, LN2 01/10/21   Burping    Chronic airway obstruction, not elsewhere classified    Dyspnea    Dysrhythmia    Macular degeneration (senile) of retina, unspecified    Malignant neoplasm of urinary bladder ([place]) 03/2019   Partial bladder resection and Rad tx's.    Obstructive chronic bronchitis with exacerbation (HCC)    Osteoporosis, unspecified    Oxygen deficiency    Personal history of peptic ulcer disease    SCC (squamous cell carcinoma) 12/08/2020   right pretibia superior, EDC 01/10/21   SCC (squamous cell carcinoma) 07/03/2022   left medial calf needs ED&C   Squamous cell carcinoma in situ (SCCIS) 07/03/2022   right pretibia sccis needs ED&C   Tobacco use disorder    Unspecified essential hypertension    Unspecified glaucoma(365.9)     Social History   Socioeconomic History   Marital status: Married    Spouse name: Not on file   Number of children: 3   Years of education: Not  on file   Highest education level: Not on file  Occupational History   Occupation: RETIRED    Employer: RETIRED  Tobacco Use   Smoking status: Former    Packs/day: 1.30    Years: 45.00    Total pack years: 58.50    Types: Cigarettes    Quit date: 1998    Years since quitting: 26.1   Smokeless tobacco: Never  Vaping Use   Vaping Use: Never used  Substance and Sexual Activity   Alcohol use: Yes    Alcohol/week: 14.0 standard drinks of alcohol    Types: 14 Glasses of wine per week    Comment: 1-2 wine daily   Drug use: No   Sexual activity: Yes    Birth control/protection: Post-menopausal  Other Topics Concern   Not on file  Social History Narrative   Tobacco abuse x 45 years   Social Determinants of Health   Financial Resource Strain: Low Risk  (11/07/2021)   Overall Financial Resource Strain (CARDIA)    Difficulty of Paying Living Expenses: Not very hard  Food Insecurity: No Food Insecurity (07/30/2022)   Hunger Vital Sign    Worried About Running Out of Food in the Last Year: Never true    Ran Out of Food in the Last Year: Never true  Transportation Needs: No Transportation Needs (07/30/2022)   PRAPARE - Hydrologist (Medical): No    Lack of Transportation (Non-Medical): No  Physical Activity: Insufficiently Active (11/07/2021)   Exercise Vital Sign    Days of Exercise per Week: 7 days    Minutes of Exercise per Session: 10 min  Stress: No Stress Concern Present (11/07/2021)   [place]    Feeling of Stress : Only a little  Social Connections: Moderately Isolated (11/07/2021)   Social Connection and Isolation Panel [NHANES]    Frequency of Communication with Friends and Family: More than three times a week    Frequency of Social Gatherings with Friends and Family: More than three times a week    Attends Religious Services: Never    Marine scientist or Organizations:  No    Attends Archivist Meetings: Never    Marital Status: Married  Human resources officer Violence: Not At Risk (11/07/2021)   Humiliation, Afraid, Rape, and Kick questionnaire  Fear of Current or Ex-Partner: No    Emotionally Abused: No    Physically Abused: No    Sexually Abused: No    Past Surgical History:  Procedure Laterality Date   CATARACT EXTRACTION W/ INTRAOCULAR LENS  IMPLANT, BILATERAL     CYSTOSCOPY WITH STENT PLACEMENT Left 05/22/2019   Procedure: CYSTOSCOPY WITH STENT PLACEMENT;  Surgeon: Billey Co, MD;  Location: [facility];  Service: Urology;  Laterality: Left;   ELBOW FRACTURE SURGERY Left    LOWER EXTREMITY ANGIOGRAPHY Right 11/20/2021   Procedure: Lower Extremity Angiography;  Surgeon: Algernon Huxley, MD;  Location: [place] [facility];  Service: Cardiovascular;  Laterality: Right;   LOWER EXTREMITY ANGIOGRAPHY Left 09/03/2022   Procedure: Lower Extremity Angiography;  Surgeon: Algernon Huxley, MD;  Location: [place] [facility];  Service: Cardiovascular;  Laterality: Left;   TRANSURETHRAL RESECTION OF BLADDER TUMOR N/A 05/22/2019   Procedure: TRANSURETHRAL RESECTION OF BLADDER TUMOR (TURBT);  Surgeon: Billey Co, MD;  Location: [facility];  Service: Urology;  Laterality: N/A;    Family History  Adopted: Yes    No Known Allergies     Latest Ref Rng & Units 07/26/2022    4:48 AM 07/25/2022   10:32 AM 05/29/2022   10:35 AM  CBC  WBC 4.0 - 10.5 K/uL 3.1  9.7  5.1   Hemoglobin 12.0 - 15.0 g/dL 9.3  12.8  12.9   Hematocrit 36.0 - 46.0 % 28.8  39.7  38.4   Platelets 150 - 400 K/uL 167  287  217.0       CMP     Component Value Date/Time   NA 139 07/27/2022 0532   NA 141 06/10/2019 1324   K 3.9 07/27/2022 0532   K 4.1 07/23/2012 1536   CL 104 07/27/2022 0532   CO2 28 07/27/2022 0532   GLUCOSE 146 (H) 07/27/2022 0532   BUN 18 09/03/2022 1002   BUN 15 06/10/2019 1324   CREATININE 0.67 09/03/2022 1002   CREATININE 1.26 (H) 12/23/2012 1440    CALCIUM 8.5 (L) 07/27/2022 0532   PROT 5.4 (L) 07/26/2022 0448   PROT 6.6 06/10/2019 1324   ALBUMIN 2.9 (L) 07/26/2022 0448   ALBUMIN 4.5 06/10/2019 1324   AST 24 07/26/2022 0448   ALT 40 07/26/2022 0448   ALKPHOS 63 07/26/2022 0448   BILITOT 0.7 07/26/2022 0448   BILITOT 0.4 06/10/2019 1324   GFRNONAA >60 09/03/2022 1002   GFRAA 62 06/10/2019 1324     No results found.     Assessment & Plan:   1. Peripheral arterial disease with history of revascularization ([place]) Recommend:  The patient is status post successful angiogram with intervention.  The patient reports that the claudication symptoms and leg pain has improved.   The patient denies lifestyle limiting changes at this point in time.  No further invasive studies, angiography or surgery at this time The patient should continue walking and begin a more formal exercise program.  The patient should continue antiplatelet therapy and aggressive treatment of the lipid abnormalities  Continued surveillance is indicated as atherosclerosis is likely to progress with time.    Patient should undergo noninvasive studies as ordered. The patient will follow up with me to review the studies.   2. HYPERCHOLESTEROLEMIA Continue statin as ordered and reviewed, no changes at this time  3. Essential hypertension, benign Continue antihypertensive medications as already ordered, these medications have been reviewed and there are no changes at this time.  Current Outpatient Medications on File Prior to Visit  Medication Sig Dispense Refill   furosemide (LASIX) 20 MG tablet Take 1 tablet (20 mg total) by mouth daily for 5 days. 5 tablet 0   albuterol (VENTOLIN HFA) 108 (90 Base) MCG/ACT inhaler Inhale 2 puffs into the lungs every 6 (six) hours as needed for wheezing or shortness of breath. 8 g 0   ascorbic acid (VITAMIN C) 1000 MG tablet Take 1,000 mg by mouth daily.      aspirin EC 81 MG tablet Take 1 tablet (81 mg total) by mouth daily.  (Patient not taking: Reported on 09/03/2022) 150 tablet 2   atorvastatin (LIPITOR) 10 MG tablet TAKE 1 TABLET EVERY DAY 90 tablet 3   budesonide-formoterol (SYMBICORT) 160-4.5 MCG/ACT inhaler Inhale 2 puffs into the lungs in the morning and at bedtime. 3 each 0   Cholecalciferol (VITAMIN D) 125 MCG (5000 UT) CAPS Take 5,000 Units by mouth daily.     clopidogrel (PLAVIX) 75 MG tablet TAKE 1 TABLET EVERY DAY 90 tablet 3   docusate sodium (COLACE) 100 MG capsule Take 100 mg by mouth 2 (two) times daily.     imiquimod (ALDARA) 5 % cream Apply thin layer to skin cancer and small area of surrounding skin 5 times per week for 6 weeks. 24 each 0   losartan (COZAAR) 25 MG tablet 25 mg daily.      Multiple Vitamins-Minerals (PRESERVISION AREDS) TABS Take 1 tablet by mouth 2 (two) times daily.     Omega-3 Fatty Acids (FISH OIL) 1000 MG CAPS Take 1,000 mg by mouth 2 (two) times daily.  (Patient not taking: Reported on 09/03/2022)     OXYGEN Inhale 4 L into the lungs as needed.     verapamil (VERELAN PM) 360 MG 24 hr capsule Take 360 mg by mouth daily.     Vitamin E 180 MG CAPS Take 180 mg by mouth daily.     No current facility-administered medications on file prior to visit.    There are no Patient Instructions on file for this visit. No follow-ups on file.   Kris Hartmann, NP

## 2022-10-05 LAB — VAS US ABI WITH/WO TBI
Left ABI: 0.91
Right ABI: 0.88

## 2022-10-08 DIAGNOSIS — I739 Peripheral vascular disease, unspecified: Secondary | ICD-10-CM | POA: Diagnosis not present

## 2022-10-08 DIAGNOSIS — J9611 Chronic respiratory failure with hypoxia: Secondary | ICD-10-CM | POA: Diagnosis not present

## 2022-10-08 DIAGNOSIS — I10 Essential (primary) hypertension: Secondary | ICD-10-CM | POA: Diagnosis not present

## 2022-10-08 DIAGNOSIS — E7800 Pure hypercholesterolemia, unspecified: Secondary | ICD-10-CM | POA: Diagnosis not present

## 2022-10-08 DIAGNOSIS — J432 Centrilobular emphysema: Secondary | ICD-10-CM | POA: Diagnosis not present

## 2022-10-16 ENCOUNTER — Ambulatory Visit (INDEPENDENT_AMBULATORY_CARE_PROVIDER_SITE_OTHER): Payer: Medicare HMO | Admitting: Vascular Surgery

## 2022-10-16 ENCOUNTER — Encounter (INDEPENDENT_AMBULATORY_CARE_PROVIDER_SITE_OTHER): Payer: Medicare HMO

## 2022-10-23 ENCOUNTER — Telehealth: Payer: Self-pay | Admitting: *Deleted

## 2022-10-23 DIAGNOSIS — E7800 Pure hypercholesterolemia, unspecified: Secondary | ICD-10-CM

## 2022-10-23 DIAGNOSIS — I10 Essential (primary) hypertension: Secondary | ICD-10-CM

## 2022-10-23 DIAGNOSIS — R7303 Prediabetes: Secondary | ICD-10-CM

## 2022-10-23 NOTE — Telephone Encounter (Signed)
-----   Message from Ellamae Sia sent at 10/23/2022 10:56 AM EDT ----- Regarding: Lab orders for Friday,3.29.24 Patient is scheduled for CPX labs, please order future labs, Thanks , Karna Christmas

## 2022-10-29 ENCOUNTER — Encounter: Payer: Self-pay | Admitting: Internal Medicine

## 2022-10-29 ENCOUNTER — Ambulatory Visit (INDEPENDENT_AMBULATORY_CARE_PROVIDER_SITE_OTHER): Payer: Medicare HMO | Admitting: Internal Medicine

## 2022-10-29 VITALS — BP 128/78 | HR 74 | Temp 97.9°F | Ht 61.0 in | Wt 91.0 lb

## 2022-10-29 DIAGNOSIS — J449 Chronic obstructive pulmonary disease, unspecified: Secondary | ICD-10-CM | POA: Diagnosis not present

## 2022-10-29 DIAGNOSIS — J9611 Chronic respiratory failure with hypoxia: Secondary | ICD-10-CM | POA: Diagnosis not present

## 2022-10-29 DIAGNOSIS — R0689 Other abnormalities of breathing: Secondary | ICD-10-CM

## 2022-10-29 NOTE — Patient Instructions (Addendum)
Continue Symbicort Continue oxygen therapy  Avoid secondhand smoke Avoid SICK contacts Recommend  Masking  when appropriate Recommend Keep up-to-date with vaccinations

## 2022-10-29 NOTE — Progress Notes (Signed)
[facility] Consultation     Date: 10/29/2022,   MRN# [id number] DARTHY BODELL 07/08/1934    Admission                  Current   Synopsis: Gezelle Arab was first seen by the [facility] pulmonary clinic in the summer of 2013. She has COPD. Simple spirometry performed in clinic showed an FEV1 of 45% predicted with clear obstruction.  She smoked 2 pack/day x 40 years, quit  17 years ago according to patient. She is a frequent exacerbation phenotype. She uses 2 L of oxygen continuously with exertion and at night   lling, mild erythema CC  Follow up COPD Follow up hypoxic resp failure(chronic)  HPI  No exacerbation at this time No evidence of heart failure at this time No evidence or signs of infection at this time No respiratory distress No fevers, chills, nausea, vomiting, diarrhea No evidence of lower extremity edema No evidence hemoptysis   Patient continues to use oxygen  Patient diagnosed with bladder cancer sees urology  Chronic shortness of breath and dyspnea exertion seems to be stable over the last several years  Patient on oxygen therapy 24/7 Patient needs this for survival  Diagnosed with COVID July 2022 Declining ever since then  +HOPS ADMISSION FOR SEVERE COPD [place] 2023, recovered  o signs of CHF or lower ext swelling at t ssues, has chCurrent Medication:   Current Outpatient Medications:    furosemide (LASIX) 20 MG tablet, Take 1 tablet (20 mg total) by mouth daily for 5 days., Disp: 5 tablet, Rfl: 0   albuterol (VENTOLIN HFA) 108 (90 Base) MCG/ACT inhaler, Inhale 2 puffs into the lungs every 6 (six) hours as needed for wheezing or shortness of breath., Disp: 8 g, Rfl: 0   ascorbic acid (VITAMIN C) 1000 MG tablet, Take 1,000 mg by mouth daily. , Disp: , Rfl:    aspirin EC 81 MG tablet, Take 1 tablet (81 mg total) by mouth daily. (Patient not taking: Reported on 09/03/2022), Disp: 150 tablet, Rfl: 2   atorvastatin (LIPITOR) 10  MG tablet, TAKE 1 TABLET EVERY DAY, Disp: 90 tablet, Rfl: 3   budesonide-formoterol (SYMBICORT) 160-4.5 MCG/ACT inhaler, Inhale 2 puffs into the lungs in the morning and at bedtime., Disp: 3 each, Rfl: 0   Cholecalciferol (VITAMIN D) 125 MCG (5000 UT) CAPS, Take 5,000 Units by mouth daily., Disp: , Rfl:    clopidogrel (PLAVIX) 75 MG tablet, TAKE 1 TABLET EVERY DAY, Disp: 90 tablet, Rfl: 3   docusate sodium (COLACE) 100 MG capsule, Take 100 mg by mouth 2 (two) times daily., Disp: , Rfl:    imiquimod (ALDARA) 5 % cream, Apply thin layer to skin cancer and small area of surrounding skin 5 times per week for 6 weeks., Disp: 24 each, Rfl: 0   losartan (COZAAR) 25 MG tablet, 25 mg daily. , Disp: , Rfl:    Multiple Vitamins-Minerals (PRESERVISION AREDS) TABS, Take 1 tablet by mouth 2 (two) times daily., Disp: , Rfl:    Omega-3 Fatty Acids (FISH OIL) 1000 MG CAPS, Take 1,000 mg by mouth 2 (two) times daily.  (Patient not taking: Reported on 09/03/2022), Disp: , Rfl:    OXYGEN, Inhale 4 L into the lungs as needed., Disp: , Rfl:    verapamil (VERELAN PM) 360 MG 24 hr capsule, Take 360 mg by mouth daily., Disp: , Rfl:    Vitamin E 180 MG CAPS, Take 180 mg by mouth daily.,  Disp: , Rfl:      ALLERGIES   Patient has no known allergies.      Review of Systems: Gen:  Denies  fever, sweats, chills weight loss  HEENT: Denies blurred vision, double vision, ear pain, eye pain, hearing loss, nose bleeds, sore throat Cardiac:  No dizziness, chest pain or heaviness, chest tightness,edema, No JVD Resp:   No cough, -sputum production, +shortness of breath,-wheezing, -hemoptysis,  Other:  All other systems negative  BP 128/78 (BP Location: Left Arm, Cuff Size: Normal)   Pulse 74   Temp 97.9 F (36.6 C) (Temporal)   Ht 5\' 1"  (1.549 m)   Wt 91 lb (41.3 kg)   SpO2 91%   BMI 17.19 kg/m   Physical Examination:   General Appearance: No distress  EYES PERRLA, EOM intact.   NECK Supple, No JVD Pulmonary:  normal breath sounds, No wheezing.  CardiovascularNormal S1,S2.  No m/r/g.   Abdomen: Benign, Soft, non-tender. Neurology UE/LE 5/5 strength, no focal deficits Ext pulses intact, cap refill intact ALL OTHER ROS ARE NEGATIVE          ASSESSMENT/PLAN   89 year old white female seen today for follow-up assessment for COPD Gold stage D end-stage severe COPD with chronic hypoxic respiratory failure with CT findings suggestive of severe emphysema and bronchiectasis with right lower lobe pleural-based nodule with excessive daytime sleepiness findings may suggest underlying sleep apnea but does not want to go any further sleep apnea testing or does not want any other further imaging with CT scans   States COPD  Patient was started on nebulized therapy now switch back to Symbicort  Use flutter valve as tolerated  Patient states she can take her Symbicort as prescribed however we will revisit this in the next 6 months to assess proper usage and efficacy of traditional inhaler therapy  Patient is at a very high risk for pulmonary complications if she were to undergo any type of procedure   Chronic hypoxic respiratory failure due to COPD Continue using oxygen as prescribed she needs this for survival   Abnormal CT chest Right pleural-based nodule based on CT scan findings Patient does not want repeat CT chest to assess interval changes She does not want to pursue any further diagnostic testing She is happy with her life and does not want any type of work-up for this nodule We addressed repeating CT scans at this time however patient does not want any  further intervention or work-up   SEVERE RESP INEFFICIENCY CONTINUE INHALERS AND OXYGEN AS PRESCRIBED  MEDICATION ADJUSTMENTS/LABS AND TESTS ORDERED: Continue inhalers with Symbicort as prescribed Avoid secondhand smoke Continue oxygen as prescribed   Follow-up in 6 months  Total time spent 21 minutes    Carli Lefevers Patricia Pesa, M.D.   [facility]  Medical Director [place] Director [facility] Cardio-Pulmonary Department

## 2022-11-06 ENCOUNTER — Other Ambulatory Visit (INDEPENDENT_AMBULATORY_CARE_PROVIDER_SITE_OTHER): Payer: Medicare HMO

## 2022-11-06 DIAGNOSIS — I10 Essential (primary) hypertension: Secondary | ICD-10-CM

## 2022-11-06 DIAGNOSIS — R7303 Prediabetes: Secondary | ICD-10-CM

## 2022-11-06 DIAGNOSIS — E7800 Pure hypercholesterolemia, unspecified: Secondary | ICD-10-CM

## 2022-11-06 LAB — COMPREHENSIVE METABOLIC PANEL
ALT: 21 U/L (ref 0–35)
AST: 20 U/L (ref 0–37)
Albumin: 4.4 g/dL (ref 3.5–5.2)
Alkaline Phosphatase: 57 U/L (ref 39–117)
BUN: 17 mg/dL (ref 6–23)
CO2: 30 mEq/L (ref 19–32)
Calcium: 9.5 mg/dL (ref 8.4–10.5)
Chloride: 102 mEq/L (ref 96–112)
Creatinine, Ser: 0.81 mg/dL (ref 0.40–1.20)
GFR: 65.72 mL/min (ref >60.00)
Glucose, Bld: 117 mg/dL — ABNORMAL HIGH (ref 70–99)
Potassium: 4.1 mEq/L (ref 3.5–5.1)
Sodium: 141 mEq/L (ref 135–145)
Total Bilirubin: 0.4 mg/dL (ref 0.2–1.2)
Total Protein: 6.5 g/dL (ref 6.0–8.3)

## 2022-11-06 LAB — HEMOGLOBIN A1C: Hgb A1c MFr Bld: 5.7 % (ref 4.6–6.5)

## 2022-11-06 LAB — LIPID PANEL
Cholesterol: 202 mg/dL — ABNORMAL HIGH (ref 0–200)
HDL: 129.4 mg/dL (ref >39.00)
LDL Cholesterol: 63 mg/dL (ref 0–99)
NonHDL: 72.83
Total CHOL/HDL Ratio: 2
Triglycerides: 49 mg/dL (ref 0.0–149.0)
VLDL: 9.8 mg/dL (ref 0.0–40.0)

## 2022-11-06 NOTE — Progress Notes (Signed)
No critical labs need to be addressed urgently. We will discuss labs in detail at upcoming office visit.   

## 2022-11-09 ENCOUNTER — Other Ambulatory Visit: Payer: Medicare HMO

## 2022-11-12 ENCOUNTER — Ambulatory Visit (INDEPENDENT_AMBULATORY_CARE_PROVIDER_SITE_OTHER): Payer: Medicare HMO

## 2022-11-12 VITALS — Ht 61.0 in | Wt 91.0 lb

## 2022-11-12 DIAGNOSIS — Z0000 Encounter for general adult medical examination without abnormal findings: Secondary | ICD-10-CM

## 2022-11-12 NOTE — Progress Notes (Signed)
I connected with  HATTY TRIMPE on 11/12/22 by a audio enabled telemedicine application and verified that I am speaking with the correct person using two identifiers.  Patient Location: Home  Provider Location: Home Office  I discussed the limitations of evaluation and management by telemedicine. The patient expressed understanding and agreed to proceed.  Subjective:   CATILIN GAMBLER is a 89 y.o. female who presents for Medicare Annual (Subsequent) preventive examination.  Review of Systems      Cardiac Risk Factors include: advanced age (>66men, >55 women);hypertension     Objective:    Today's Vitals   11/12/22 0821  Weight: 91 lb (41.3 kg)  Height: 5\' 1"  (1.549 m)   Body mass index is 17.19 kg/m.     11/12/2022    8:33 AM 09/03/2022    9:56 AM 11/20/2021    9:16 AM 11/07/2021    9:55 AM 11/01/2020    9:53 AM 10/28/2019    9:49 AM 06/08/2019   10:58 AM  Advanced Directives  Does Patient Have a Medical Advance Directive? Yes No No Yes Yes Yes Yes  Type of Paramedic of [place];Living will   [place];Living will [place];Living will [place];Living will Living will;Healthcare Power of [place] in Chart? No - copy requested   No - copy requested No - copy requested No - copy requested     Current Medications (verified) Outpatient Encounter Medications as of 11/12/2022  Medication Sig   albuterol (VENTOLIN HFA) 108 (90 Base) MCG/ACT inhaler Inhale 2 puffs into the lungs every 6 (six) hours as needed for wheezing or shortness of breath.   ascorbic acid (VITAMIN C) 1000 MG tablet Take 1,000 mg by mouth daily.    atorvastatin (LIPITOR) 10 MG tablet TAKE 1 TABLET EVERY DAY   budesonide-formoterol (SYMBICORT) 160-4.5 MCG/ACT inhaler Inhale 2 puffs into the lungs in the morning and at bedtime.   Cholecalciferol (VITAMIN D) 125 MCG (5000 UT) CAPS Take 5,000 Units  by mouth daily.   clopidogrel (PLAVIX) 75 MG tablet TAKE 1 TABLET EVERY DAY   docusate sodium (COLACE) 100 MG capsule Take 100 mg by mouth 2 (two) times daily.   losartan (COZAAR) 25 MG tablet 25 mg daily.    Multiple Vitamins-Minerals (PRESERVISION AREDS) TABS Take 1 tablet by mouth 2 (two) times daily.   Omega-3 Fatty Acids (FISH OIL) 1000 MG CAPS Take 1,000 mg by mouth 2 (two) times daily.   OXYGEN Inhale 4 L into the lungs as needed.   verapamil (VERELAN PM) 360 MG 24 hr capsule Take 360 mg by mouth daily.   Vitamin E 180 MG CAPS Take 180 mg by mouth daily.   aspirin EC 81 MG tablet Take 1 tablet (81 mg total) by mouth daily. (Patient not taking: Reported on 10/29/2022)   imiquimod (ALDARA) 5 % cream Apply thin layer to skin cancer and small area of surrounding skin 5 times per week for 6 weeks. (Patient not taking: Reported on 11/12/2022)   No facility-administered encounter medications on file as of 11/12/2022.    Allergies (verified) Patient has no known allergies.   History: Past Medical History:  Diagnosis Date   AK (actinic keratosis) 12/08/2020   right pretibia inferior bx proven, LN2 01/10/21   Burping    Chronic airway obstruction, not elsewhere classified    Dyspnea    Dysrhythmia    Macular degeneration (senile) of retina,  unspecified    Malignant neoplasm of urinary bladder 03/2019   Partial bladder resection and Rad tx's.    Obstructive chronic bronchitis with exacerbation    Osteoporosis, unspecified    Oxygen deficiency    Personal history of peptic ulcer disease    SCC (squamous cell carcinoma) 12/08/2020   right pretibia superior, EDC 01/10/21   SCC (squamous cell carcinoma) 07/03/2022   left medial calf needs ED&C   Squamous cell carcinoma in situ (SCCIS) 07/03/2022   right pretibia sccis needs ED&C   Tobacco use disorder    Unspecified essential hypertension    Unspecified glaucoma(365.9)    Past Surgical History:  Procedure Laterality Date   CATARACT  EXTRACTION W/ INTRAOCULAR LENS  IMPLANT, BILATERAL     CYSTOSCOPY WITH STENT PLACEMENT Left 05/22/2019   Procedure: CYSTOSCOPY WITH STENT PLACEMENT;  Surgeon: Billey Co, MD;  Location: [facility] ORS;  Service: Urology;  Laterality: Left;   ELBOW FRACTURE SURGERY Left    LOWER EXTREMITY ANGIOGRAPHY Right 11/20/2021   Procedure: Lower Extremity Angiography;  Surgeon: Algernon Huxley, MD;  Location: [facility];  Service: Cardiovascular;  Laterality: Right;   LOWER EXTREMITY ANGIOGRAPHY Left 09/03/2022   Procedure: Lower Extremity Angiography;  Surgeon: Algernon Huxley, MD;  Location: [facility];  Service: Cardiovascular;  Laterality: Left;   TRANSURETHRAL RESECTION OF BLADDER TUMOR N/A 05/22/2019   Procedure: TRANSURETHRAL RESECTION OF BLADDER TUMOR (TURBT);  Surgeon: Billey Co, MD;  Location: [facility] OR[facility];  Service: Urology;  Laterality: N/A;   Family History  Adopted: Yes   Social History   Socioeconomic History   Marital status: Married    Spouse name: Not on file   Number of children: 3   Years of education: Not on file   Highest education level: Not on file  Occupational History   Occupation: RETIRED    Employer: RETIRED  Tobacco Use   Smoking status: Former    Packs/day: 1.30    Years: 45.00    Additional pack years: 0.00    Total pack years: 58.50    Types: Cigarettes    Quit date: 1998    Years since quitting: 26.2   Smokeless tobacco: Never  Vaping Use   Vaping Use: Never used  Substance and Sexual Activity   Alcohol use: Yes    Alcohol/week: 14.0 standard drinks of alcohol    Types: 14 Glasses of wine per week    Comment: 1-2 wine daily   Drug use: No   Sexual activity: Yes    Birth control/protection: Post-menopausal  Other Topics Concern   Not on file  Social History Narrative   Tobacco abuse x 45 years   Social Determinants of Health   Financial Resource Strain: Low Risk  (11/12/2022)   Overall Financial Resource Strain (CARDIA)     Difficulty of Paying Living Expenses: Not hard at all  Food Insecurity: No Food Insecurity (11/12/2022)   Hunger Vital Sign    Worried About Running Out of Food in the Last Year: Never true    Ran Out of Food in the Last Year: Never true  Transportation Needs: No Transportation Needs (11/12/2022)   PRAPARE - Hydrologist (Medical): No    Lack of Transportation (Non-Medical): No  Physical Activity: Insufficiently Active (11/12/2022)   Exercise Vital Sign    Days of Exercise per Week: 7 days    Minutes of Exercise per Session: 20 min  Stress: No Stress Concern  Present (11/12/2022)   [place]    Feeling of Stress : Not at all  Social Connections: Moderately Isolated (11/12/2022)   Social Connection and Isolation Panel [NHANES]    Frequency of Communication with Friends and Family: More than three times a week    Frequency of Social Gatherings with Friends and Family: Twice a week    Attends Religious Services: Never    Marine scientist or Organizations: No    Attends Music therapist: Never    Marital Status: Married    Tobacco Counseling Counseling given: Not Answered   Clinical Intake:  Pre-visit preparation completed: Yes  Pain : No/denies pain     Nutritional Risks: None Diabetes: No  How often do you need to have someone help you when you read instructions, pamphlets, or other written materials from your doctor or pharmacy?: 5 - Always (visually impaired)  Diabetic? no  Interpreter Needed?: No  Information entered by :: C.Rondy Krupinski LPN   Activities of Daily Living    11/12/2022    8:34 AM 09/03/2022    9:53 AM  In your present state of health, do you have any difficulty performing the following activities:  Hearing? 0 0  Vision? 1 0  Comment Visually impaaired   Difficulty concentrating or making decisions? 0 0  Walking or climbing stairs? 1 1  Comment on  oxygen   Dressing or bathing? 1 1  Comment husband assists   Doing errands, shopping? 1   Comment husband assists   Preparing Food and eating ? Y   Comment husband assists   Using the Toilet? N   In the past six months, have you accidently leaked urine? Y   Comment occasional   Do you have problems with loss of bowel control? N   Managing your Medications? N   Managing your Finances? Y   Comment Husband assists   Housekeeping or managing your Housekeeping? Y   Comment Husband assists     Patient Care Team: [name], MD as PCP - General Flora Lipps, MD as Consulting Physician (Pulmonary Disease) Birder Robson, MD as Referring Physician (Ophthalmology) [name], [facility] as Pharmacist (Pharmacist) Andrez Grime, MD as Referring Physician (Cardiology) Billey Co, MD as Consulting Physician (Urology)  Indicate any recent Medical Services you may have received from other than [facility] providers in the past year (date may be approximate).     Assessment:   This is a routine wellness examination for Talea.  Hearing/Vision screen Hearing Screening - Comments:: no Vision Screening - Comments:: Glasses - [place]  Dietary issues and exercise activities discussed: Current Exercise Habits: Home exercise routine, Type of exercise: strength training/weights, Time (Minutes): 20, Frequency (Times/Week): 7, Weekly Exercise (Minutes/Week): 140, Intensity: Mild, Exercise limited by: Other - see comments (COPD, Visually impaired)   Goals Addressed             This Visit's Progress    Patient Stated       No new goals.       Depression Screen    11/12/2022    8:32 AM 11/07/2021    9:48 AM 11/01/2020    9:59 AM 10/28/2019    9:53 AM 11/05/2017    3:42 PM 10/11/2017    1:21 PM 10/04/2016    1:42 PM  PHQ 2/9 Scores  PHQ - 2 Score 0 0 0 0 1 0 0  PHQ- 9 Score   0  0  0     Fall Risk    11/12/2022    8:34 AM 11/07/2021    9:46 AM 11/01/2020    9:57 AM 10/28/2019     9:50 AM 11/28/2017   10:31 AM  Fall Risk   Falls in the past year? 0 0 0 1 Yes  Comment    lightheadedness   Number falls in past yr: 0 0 0 1 1  Injury with Fall? 0 0 0 0 Yes  Risk Factor Category      High Fall Risk  Risk for fall due to : No Fall Risks Orthopedic patient;Other (Comment) Medication side effect Medication side effect;History of fall(s) History of fall(s)  Risk for fall due to: Comment  neuropathy     Follow up Falls prevention discussed;Falls evaluation completed Education provided;Falls prevention discussed Falls evaluation completed;Falls prevention discussed Falls evaluation completed;Falls prevention discussed Falls evaluation completed;Education provided    FALL RISK PREVENTION PERTAINING TO THE HOME:  Any stairs in or around the home? No  If so, are there any without handrails? No  Home free of loose throw rugs in walkways, pet beds, electrical cords, etc? Yes  Adequate lighting in your home to reduce risk of falls? Yes   ASSISTIVE DEVICES UTILIZED TO PREVENT FALLS:  Life alert? No  Use of a cane, walker or w/c? No  Grab bars in the bathroom? No  Shower chair or bench in shower? Yes  Elevated toilet seat or a handicapped toilet? No    Cognitive Function:    11/01/2020   10:03 AM 10/28/2019    9:56 AM 10/11/2017    1:59 PM 10/04/2016    1:44 PM  MMSE - Mini Mental State Exam  Orientation to time 5 5 5 5   Orientation to Place 5 5 5 5   Registration 3 3 3 3   Attention/ Calculation 5 5 0 0  Recall 3 3 3 3   Language- name 2 objects   0 0  Language- repeat 1 1 1 1   Language- follow 3 step command   3 3  Language- read & follow direction   0 0  Write a sentence   0 0  Copy design   0 0  Total score   20 20        11/12/2022    8:37 AM 11/07/2021    9:55 AM  6CIT Screen  What Year? 0 points 0 points  What month? 0 points 0 points  What time? 0 points 0 points  Count back from 20 0 points 0 points  Months in reverse 0 points 0 points  Repeat phrase  0 points 2 points  Total Score 0 points 2 points    Immunizations Immunization History  Administered Date(s) Administered   Fluad Quad(high Dose 65+) 04/09/2019, 04/27/2020, 04/28/2021, 05/08/2022   H1N1 10/04/2008   Influenza Split 04/21/2011, 05/12/2012   Influenza Whole 05/27/2007, 05/08/2008, 05/04/2009, 04/26/2010   Influenza,inj,Quad PF,6+ Mos 05/06/2013, 05/06/2014, 04/21/2015, 05/11/2016, 04/17/2017, 05/01/2018   Pneumococcal Conjugate-13 09/21/2014   Pneumococcal Polysaccharide-23 03/13/2006, 06/28/2011   Td 03/13/2006   Tdap 10/04/2016   Zoster Recombinat (Shingrix) 11/08/2020, 01/19/2021   Zoster, Live 09/05/2010    TDAP status: Up to date  Flu Vaccine status: Up to date  Pneumococcal vaccine status: Up to date  Covid-19 vaccine status: Declined, Education has been provided regarding the importance of this vaccine but patient still declined. Advised may receive this vaccine at local pharmacy or Health Dept.or vaccine clinic. Aware to  provide a copy of the vaccination record if obtained from local pharmacy or Health Dept. Verbalized acceptance and understanding.  Qualifies for Shingles Vaccine? Yes   Zostavax completed  unknown   Shingrix Completed?: Yes  Screening Tests Health Maintenance  Topic Date Due   INFLUENZA VACCINE  03/14/2023   Medicare Annual Wellness (AWV)  11/12/2023   DTaP/Tdap/Td (3 - Td or Tdap) 10/04/2026   Pneumonia Vaccine 31+ Years old  Completed   DEXA SCAN  Completed   Zoster Vaccines- Shingrix  Completed   HPV VACCINES  Aged Out   COVID-19 Vaccine  Discontinued    Health Maintenance  There are no preventive care reminders to display for this patient.   Colorectal cancer screening: No longer required.   Mammogram status: No longer required due to age.  Bone Density status: Completed 05/28/16. Results reflect: Bone density results: OSTEOPOROSIS. Repeat every 2 years. Pt declined.  Lung Cancer Screening: (Low Dose CT Chest  recommended if Age 73-80 years, 30 pack-year currently smoking OR have quit w/in 15years.) does not qualify.   Lung Cancer Screening Referral: no  Additional Screening:  Hepatitis C Screening: does not qualify; Completed no  Vision Screening: Recommended annual ophthalmology exams for early detection of glaucoma and other disorders of the eye. Is the patient up to date with their annual eye exam?  Yes  Who is the provider or what is the name of the office in which the patient attends annual eye exams? [place] If pt is not established with a provider, would they like to be referred to a provider to establish care? No .   Dental Screening: Recommended annual dental exams for proper oral hygiene  Community Resource Referral / Chronic Care Management: CRR required this visit?  No   CCM required this visit?  No      Plan:     I have personally reviewed and noted the following in the patient's chart:   Medical and social history Use of alcohol, tobacco or illicit drugs  Current medications and supplements including opioid prescriptions. Patient is not currently taking opioid prescriptions. Functional ability and status Nutritional status Physical activity Advanced directives List of other physicians Hospitalizations, surgeries, and ER visits in previous 12 months Vitals Screenings to include cognitive, depression, and falls Referrals and appointments  In addition, I have reviewed and discussed with patient certain preventive protocols, quality metrics, and best practice recommendations. A written personalized care plan for preventive services as well as general preventive health recommendations were provided to patient.     Lebron Conners, LPN   [id number]   Nurse Notes: None

## 2022-11-12 NOTE — Patient Instructions (Signed)
Misty Ferguson , Thank you for taking time to come for your Medicare Wellness Visit. I appreciate your ongoing commitment to your health goals. Please review the following plan we discussed and let me know if I can assist you in the future.   These are the goals we discussed:  Goals      Patient Stated     11/07/2021 - I will get back to doing chair exercises daily for about 10-15 minutes and continue stretching     Patient Stated     No new goals.     Pharmacy Care Plan     CARE PLAN ENTRY  Current Barriers:  Chronic Disease Management support, education, and care coordination needs related to Hypertension and COPD   Hypertension BP Readings from Last 3 Encounters:  04/20/20 (!) 148/79  10/30/19 132/60  10/28/19 (!) 144/81  Pharmacist Clinical Goal(s): Over the next 6 months, patient will work with PharmD and providers to achieve BP goal <140/90 mmHg Current regimen:  Losartan 25 mg - 1 tablet daily Verapamil 360 mg - 1 tablet daily Interventions: Reviewed recent BP readings  Reviewed labs: kidney function, potassium levels  Recommend continuing current therapy Patient self care activities - Over the next 6 months, patient will: Check BP once monthly and 5 days prior to any office visits, document, and provide at future appointments Ensure daily salt intake < 2300 mg/day  COPD Pharmacist Clinical Goal(s) Over the next 6 months, patient will work with PharmD and providers to improve shortness of breath Current regimen:  Stiolto 2.5-2.5 mcg - Inhale 2 puffs daily  Interventions: Contact insurance to review cost of Stiolto alternatives - Per insurance, Stiolto is the preferred and lowest cost option in the drug class, Anoro is not covered. Discussed patient assistance options - patient declined application [id number] Patient self care activities - Over the next 6 months, patient will: Discuss inhaled steroid therapy with pulmonologist   Please see past updates related to this goal  by clicking on the "Past Updates" button in the selected goal          This is a list of the screening recommended for you and due dates:  Health Maintenance  Topic Date Due   Flu Shot  03/14/2023   Medicare Annual Wellness Visit  11/12/2023   DTaP/Tdap/Td vaccine (3 - Td or Tdap) 10/04/2026   Pneumonia Vaccine  Completed   DEXA scan (bone density measurement)  Completed   Zoster (Shingles) Vaccine  Completed   HPV Vaccine  Aged Out   COVID-19 Vaccine  Discontinued    Advanced directives: Please bring a copy of your health care power of attorney and living will to the office to be added to your chart at your convenience.   Conditions/risks identified: none  Next appointment: Follow up in one year for your annual wellness visit 11/13/23 @ 8:45 telephone visit   Preventive Care 65 Years and Older, Female Preventive care refers to lifestyle choices and visits with your health care provider that can promote health and wellness. What does preventive care include? A yearly physical exam. This is also called an annual well check. Dental exams once or twice a year. Routine eye exams. Ask your health care provider how often you should have your eyes checked. Personal lifestyle choices, including: Daily care of your teeth and gums. Regular physical activity. Eating a healthy diet. Avoiding tobacco and drug use. Limiting alcohol use. Practicing safe sex. Taking low-dose aspirin every day. Taking vitamin and mineral supplements  as recommended by your health care provider. What happens during an annual well check? The services and screenings done by your health care provider during your annual well check will depend on your age, overall health, lifestyle risk factors, and family history of disease. Counseling  Your health care provider may ask you questions about your: Alcohol use. Tobacco use. Drug use. Emotional well-being. Home and relationship well-being. Sexual  activity. Eating habits. History of falls. Memory and ability to understand (cognition). Work and work Statistician. Reproductive health. Screening  You may have the following tests or measurements: Height, weight, and BMI. Blood pressure. Lipid and cholesterol levels. These may be checked every 5 years, or more frequently if you are over 71 years old. Skin check. Lung cancer screening. You may have this screening every year starting at age 13 if you have a 30-pack-year history of smoking and currently smoke or have quit within the past 15 years. Fecal occult blood test (FOBT) of the stool. You may have this test every year starting at age 20. Flexible sigmoidoscopy or colonoscopy. You may have a sigmoidoscopy every 5 years or a colonoscopy every 10 years starting at age 45. Hepatitis C blood test. Hepatitis B blood test. Sexually transmitted disease (STD) testing. Diabetes screening. This is done by checking your blood sugar (glucose) after you have not eaten for a while (fasting). You may have this done every 1-3 years. Bone density scan. This is done to screen for osteoporosis. You may have this done starting at age 32. Mammogram. This may be done every 1-2 years. Talk to your health care provider about how often you should have regular mammograms. Talk with your health care provider about your test results, treatment options, and if necessary, the need for more tests. Vaccines  Your health care provider may recommend certain vaccines, such as: Influenza vaccine. This is recommended every year. Tetanus, diphtheria, and acellular pertussis (Tdap, Td) vaccine. You may need a Td booster every 10 years. Zoster vaccine. You may need this after age 9. Pneumococcal 13-valent conjugate (PCV13) vaccine. One dose is recommended after age 2. Pneumococcal polysaccharide (PPSV23) vaccine. One dose is recommended after age 77. Talk to your health care provider about which screenings and vaccines  you need and how often you need them. This information is not intended to replace advice given to you by your health care provider. Make sure you discuss any questions you have with your health care provider. Document Released: 08/26/2015 Document Revised: 04/18/2016 Document Reviewed: 05/31/2015 Elsevier Interactive Patient Education  2017 [employer] Prevention in the Home Falls can cause injuries. They can happen to people of all ages. There are many things you can do to make your home safe and to help prevent falls. What can I do on the outside of my home? Regularly fix the edges of walkways and driveways and fix any cracks. Remove anything that might make you trip as you walk through a door, such as a raised step or threshold. Trim any bushes or trees on the path to your home. Use bright outdoor lighting. Clear any walking paths of anything that might make someone trip, such as rocks or tools. Regularly check to see if handrails are loose or broken. Make sure that both sides of any steps have handrails. Any raised decks and porches should have guardrails on the edges. Have any leaves, snow, or ice cleared regularly. Use sand or salt on walking paths during winter. Clean up any spills in your garage  right away. This includes oil or grease spills. What can I do in the bathroom? Use night lights. Install grab bars by the toilet and in the tub and shower. Do not use towel bars as grab bars. Use non-skid mats or decals in the tub or shower. If you need to sit down in the shower, use a plastic, non-slip stool. Keep the floor dry. Clean up any water that spills on the floor as soon as it happens. Remove soap buildup in the tub or shower regularly. Attach bath mats securely with double-sided non-slip rug tape. Do not have throw rugs and other things on the floor that can make you trip. What can I do in the bedroom? Use night lights. Make sure that you have a light by your bed that  is easy to reach. Do not use any sheets or blankets that are too big for your bed. They should not hang down onto the floor. Have a firm chair that has side arms. You can use this for support while you get dressed. Do not have throw rugs and other things on the floor that can make you trip. What can I do in the kitchen? Clean up any spills right away. Avoid walking on wet floors. Keep items that you use a lot in easy-to-reach places. If you need to reach something above you, use a strong step stool that has a grab bar. Keep electrical cords out of the way. Do not use floor polish or wax that makes floors slippery. If you must use wax, use non-skid floor wax. Do not have throw rugs and other things on the floor that can make you trip. What can I do with my stairs? Do not leave any items on the stairs. Make sure that there are handrails on both sides of the stairs and use them. Fix handrails that are broken or loose. Make sure that handrails are as long as the stairways. Check any carpeting to make sure that it is firmly attached to the stairs. Fix any carpet that is loose or worn. Avoid having throw rugs at the top or bottom of the stairs. If you do have throw rugs, attach them to the floor with carpet tape. Make sure that you have a light switch at the top of the stairs and the bottom of the stairs. If you do not have them, ask someone to add them for you. What else can I do to help prevent falls? Wear shoes that: Do not have high heels. Have rubber bottoms. Are comfortable and fit you well. Are closed at the toe. Do not wear sandals. If you use a stepladder: Make sure that it is fully opened. Do not climb a closed stepladder. Make sure that both sides of the stepladder are locked into place. Ask someone to hold it for you, if possible. Clearly mark and make sure that you can see: Any grab bars or handrails. First and last steps. Where the edge of each step is. Use tools that help you  move around (mobility aids) if they are needed. These include: Canes. Walkers. Scooters. Crutches. Turn on the lights when you go into a dark area. Replace any light bulbs as soon as they burn out. Set up your furniture so you have a clear path. Avoid moving your furniture around. If any of your floors are uneven, fix them. If there are any pets around you, be aware of where they are. Review your medicines with your doctor. Some medicines can make  you feel dizzy. This can increase your chance of falling. Ask your doctor what other things that you can do to help prevent falls. This information is not intended to replace advice given to you by your health care provider. Make sure you discuss any questions you have with your health care provider. Document Released: 05/26/2009 Document Revised: 01/05/2016 Document Reviewed: 09/03/2014 Elsevier Interactive Patient Education  2017 [employer].

## 2022-11-20 ENCOUNTER — Ambulatory Visit (INDEPENDENT_AMBULATORY_CARE_PROVIDER_SITE_OTHER): Payer: Medicare HMO | Admitting: Family Medicine

## 2022-11-20 ENCOUNTER — Encounter: Payer: Self-pay | Admitting: Family Medicine

## 2022-11-20 VITALS — BP 130/60 | HR 70 | Temp 97.6°F | Ht 61.0 in | Wt 91.0 lb

## 2022-11-20 DIAGNOSIS — I10 Essential (primary) hypertension: Secondary | ICD-10-CM

## 2022-11-20 DIAGNOSIS — Z66 Do not resuscitate: Secondary | ICD-10-CM | POA: Diagnosis not present

## 2022-11-20 DIAGNOSIS — R7303 Prediabetes: Secondary | ICD-10-CM | POA: Diagnosis not present

## 2022-11-20 DIAGNOSIS — G8929 Other chronic pain: Secondary | ICD-10-CM

## 2022-11-20 DIAGNOSIS — M79674 Pain in right toe(s): Secondary | ICD-10-CM | POA: Diagnosis not present

## 2022-11-20 DIAGNOSIS — M79675 Pain in left toe(s): Secondary | ICD-10-CM

## 2022-11-20 DIAGNOSIS — E7800 Pure hypercholesterolemia, unspecified: Secondary | ICD-10-CM

## 2022-11-20 DIAGNOSIS — I739 Peripheral vascular disease, unspecified: Secondary | ICD-10-CM | POA: Diagnosis not present

## 2022-11-20 DIAGNOSIS — J432 Centrilobular emphysema: Secondary | ICD-10-CM | POA: Diagnosis not present

## 2022-11-20 DIAGNOSIS — Z0000 Encounter for general adult medical examination without abnormal findings: Secondary | ICD-10-CM

## 2022-11-20 NOTE — Progress Notes (Signed)
Patient ID: NYIEMA SKYBERG, female    DOB: 01/11/1934, 89 y.o.   MRN: [id number]  This visit was conducted in person.  BP 130/60   Pulse 70   Temp 97.6 F (36.4 C) (Temporal)   Ht 5\' 1"  (1.549 m)   Wt 91 lb (41.3 kg)   SpO2 95% Comment: 2.5 L O2 on demand  BMI 17.19 kg/m    CC:  Chief Complaint  Patient presents with   Annual Exam    Part 2    Subjective:   HPI: PAISLI ELLEFSEN is a 89 y.o. female presenting on 11/20/2022 for Annual Exam (Part 2)  The patient presents for  complete physical and review of chronic health problems. He/She also has the following acute concerns today: Bilateral great toes sore... pain around nail bed.  No swelling in toes, no redness in joints.  No change in pain with rest or activity.  The patient saw a LPN or RN for medicare wellness visit. 11/12/2022  Prevention and wellness was reviewed in detail. Note reviewed and important notes copied below.   PVD bilateral legs.. now seeing  vascular. Right worse than left.  Has angiogram planned.  She is having right heel.. pain causing pain  keeping her up at night.  Hydrocodone 5/325 mg 1 tablet minimal help.   COPD, severe Stable  given minimally active.  On continuous oxygen.   No chest pain.   Good water intake  Elevated Cholesterol:  LDL at goal < 70 on atorvastatin 10 mg daily Lab Results  Component Value Date   CHOL 202 (H) 11/06/2022   HDL 129.40 11/06/2022   LDLCALC 63 11/06/2022   LDLDIRECT 99.1 09/08/2013   TRIG 49.0 11/06/2022   CHOLHDL 2 11/06/2022  Using medications without problems: none Muscle aches:  none Diet compliance:  good Exercise:limited given COPD Other complaints:    Prediabetes;  Lab Results  Component Value Date   HGBA1C 5.7 11/06/2022     Hypertension:  Stable control on current medications losartan and verapamil LA BP Readings from Last 3 Encounters:  11/20/22 130/60  10/29/22 128/78  10/04/22 (!) 142/70  Using medication without problems or  lightheadedness: none Chest pain with exertion:none Edema:none Short of breath: yes Average home BPs: Other issues:     Relevant past medical, surgical, family and social history reviewed and updated as indicated. Interim medical history since our last visit reviewed. Allergies and medications reviewed and updated. Outpatient Medications Prior to Visit  Medication Sig Dispense Refill   albuterol (VENTOLIN HFA) 108 (90 Base) MCG/ACT inhaler Inhale 2 puffs into the lungs every 6 (six) hours as needed for wheezing or shortness of breath. 8 g 0   ascorbic acid (VITAMIN C) 1000 MG tablet Take 1,000 mg by mouth daily.      atorvastatin (LIPITOR) 10 MG tablet TAKE 1 TABLET EVERY DAY 90 tablet 3   budesonide-formoterol (SYMBICORT) 160-4.5 MCG/ACT inhaler Inhale 2 puffs into the lungs in the morning and at bedtime. 3 each 0   Cholecalciferol (VITAMIN D) 125 MCG (5000 UT) CAPS Take 5,000 Units by mouth daily.     clopidogrel (PLAVIX) 75 MG tablet TAKE 1 TABLET EVERY DAY 90 tablet 3   docusate sodium (COLACE) 100 MG capsule Take 100 mg by mouth 2 (two) times daily.     losartan (COZAAR) 25 MG tablet 25 mg daily.      Multiple Vitamins-Minerals (PRESERVISION AREDS) TABS Take 1 tablet by mouth 2 (two) times daily.  Omega-3 Fatty Acids (FISH OIL) 1000 MG CAPS Take 1,000 mg by mouth 2 (two) times daily.     OXYGEN Inhale 4 L into the lungs as needed.     verapamil (VERELAN PM) 360 MG 24 hr capsule Take 360 mg by mouth daily.     Vitamin E 180 MG CAPS Take 180 mg by mouth daily.     aspirin EC 81 MG tablet Take 1 tablet (81 mg total) by mouth daily. (Patient not taking: Reported on 10/29/2022) 150 tablet 2   imiquimod (ALDARA) 5 % cream Apply thin layer to skin cancer and small area of surrounding skin 5 times per week for 6 weeks. (Patient not taking: Reported on 11/12/2022) 24 each 0   No facility-administered medications prior to visit.     Per HPI unless specifically indicated in ROS section  below Review of Systems  Constitutional:  Negative for fatigue and fever.  HENT:  Negative for congestion.   Eyes:  Negative for pain.  Respiratory:  Negative for cough and shortness of breath.   Cardiovascular:  Negative for chest pain, palpitations and leg swelling.  Gastrointestinal:  Negative for abdominal pain.  Genitourinary:  Negative for dysuria and vaginal bleeding.  Musculoskeletal:  Negative for back pain.  Neurological:  Negative for syncope, light-headedness and headaches.  Psychiatric/Behavioral:  Negative for dysphoric mood.    Objective:  BP 130/60   Pulse 70   Temp 97.6 F (36.4 C) (Temporal)   Ht 5\' 1"  (1.549 m)   Wt 91 lb (41.3 kg)   SpO2 95% Comment: 2.5 L O2 on demand  BMI 17.19 kg/m   Wt Readings from Last 3 Encounters:  11/20/22 91 lb (41.3 kg)  11/12/22 91 lb (41.3 kg)  10/29/22 91 lb (41.3 kg)      Physical Exam Vitals and nursing note reviewed.  Constitutional:      General: She is not in acute distress.    Appearance: Normal appearance. She is well-developed. She is not ill-appearing or toxic-appearing.  HENT:     Head: Normocephalic.     Right Ear: Hearing, tympanic membrane, ear canal and external ear normal. Tympanic membrane is not erythematous, retracted or bulging.     Left Ear: Hearing, tympanic membrane, ear canal and external ear normal. Tympanic membrane is not erythematous, retracted or bulging.     Nose: Nose normal. No mucosal edema or rhinorrhea.     Right Sinus: No maxillary sinus tenderness or frontal sinus tenderness.     Left Sinus: No maxillary sinus tenderness or frontal sinus tenderness.     Mouth/Throat:     Pharynx: Uvula midline.  Eyes:     General: Lids are normal. Lids are everted, no foreign bodies appreciated.     Conjunctiva/sclera: Conjunctivae normal.     Pupils: Pupils are equal, round, and reactive to light.  Neck:     Thyroid: No thyroid mass or thyromegaly.     Vascular: No carotid bruit.     Trachea:  Trachea normal.  Cardiovascular:     Rate and Rhythm: Normal rate and regular rhythm.     Pulses: Decreased pulses.          Dorsalis pedis pulses are 1+ on the right side and 1+ on the left side.       Posterior tibial pulses are 0 on the right side and 0 on the left side.     Heart sounds: Normal heart sounds, S1 normal and S2 normal. No murmur heard.  No friction rub. No gallop.     Comments: Left foot warm, right toes cold  Thcikened toenails sore and painful. Pulmonary:     Effort: Pulmonary effort is normal. No tachypnea or respiratory distress.     Breath sounds: Normal breath sounds. No decreased breath sounds, wheezing, rhonchi or rales.  Abdominal:     General: Bowel sounds are normal. There is no distension or abdominal bruit.     Palpations: Abdomen is soft. There is no fluid wave or mass.     Tenderness: There is no abdominal tenderness. There is no guarding or rebound.     Hernia: No hernia is present.  Musculoskeletal:     Cervical back: Normal range of motion and neck supple.     Right lower leg: No edema.     Left lower leg: No edema.  Lymphadenopathy:     Cervical: No cervical adenopathy.  Skin:    General: Skin is warm and dry.     Findings: No rash.  Neurological:     Mental Status: She is alert.     Cranial Nerves: No cranial nerve deficit.     Sensory: No sensory deficit.  Psychiatric:        Mood and Affect: Mood is not anxious or depressed.        Speech: Speech normal.        Behavior: Behavior normal. Behavior is cooperative.        Thought Content: Thought content normal.        Judgment: Judgment normal.       Results for orders placed or performed in visit on 11/06/22  Lipid panel  Result Value Ref Range   Cholesterol 202 (H) 0 - 200 mg/dL   Triglycerides 29.549.0 0.0 - 149.0 mg/dL   HDL 284.13129.40 >24.40>39.00 mg/dL   VLDL 9.8 0.0 - 10.240.0 mg/dL   LDL Cholesterol 63 0 - 99 mg/dL   Total CHOL/HDL Ratio 2    NonHDL 72.83   Hemoglobin A1c  Result Value  Ref Range   Hgb A1c MFr Bld 5.7 4.6 - 6.5 %  Comprehensive metabolic panel  Result Value Ref Range   Sodium 141 135 - 145 mEq/L   Potassium 4.1 3.5 - 5.1 mEq/L   Chloride 102 96 - 112 mEq/L   CO2 30 19 - 32 mEq/L   Glucose, Bld 117 (H) 70 - 99 mg/dL   BUN 17 6 - 23 mg/dL   Creatinine, Ser 7.250.81 0.40 - 1.20 mg/dL   Total Bilirubin 0.4 0.2 - 1.2 mg/dL   Alkaline Phosphatase 57 39 - 117 U/L   AST 20 0 - 37 U/L   ALT 21 0 - 35 U/L   Total Protein 6.5 6.0 - 8.3 g/dL   Albumin 4.4 3.5 - 5.2 g/dL   GFR 36.6465.72 >40.34>60.00 mL/min   Calcium 9.5 8.4 - 10.5 mg/dL    This visit occurred during the SARS-CoV-2 public health emergency.  Safety protocols were in place, including screening questions prior to the visit, additional usage of staff PPE, and extensive cleaning of exam room while observing appropriate contact time as indicated for disinfecting solutions.   COVID 19 screen:  No recent travel or known exposure to COVID19 The patient denies respiratory symptoms of COVID 19 at this time. The importance of social distancing was discussed today.   Assessment and Plan   The patient's preventative maintenance and recommended screening tests for an annual wellness exam were reviewed in full today. Brought up  to date unless services declined.  Counselled on the importance of diet, exercise, and its role in overall health and mortality. The patient's FH and SH was reviewed, including their home life, tobacco status, and drug and alcohol status.   Flowsheet Row Clinical Support from 11/12/2022 in [facility]  PHQ-2 Total Score 0        Vaccines:Uptodate. Encouraged COVID vaccine Mammogram: last in 2009, not interested in continuing to follow.   DEXA: on fosamax (many years about 10 years) last checked 2009 t -3.11.Marland Kitchen Stopped fosamax 2012,    05/2016 femoral neck  -2.7  After discussion.. she has chosen not to continue bone densities as she would not use a med to  treat. Colon: Not interested in colon cancer screening. Discussed the risks and benefits of cancer screening... Pt voices understanding an chooses to not proceed with prevention as noted above.   " I am ready to go if the lord takes me" No indication for pap/DVE.    COMPLETED MOST AND DNR FORMS, sent these as well as  HCPOA and living will t be scanned into chart.  Problem List Items Addressed This Visit     Centrilobular emphysema    Chronic, severe  Followed by pulmonary on controllers and rescue medication.      Essential hypertension, benign    Stable, chronic.  Continue current medication.  Losartan 25 mg p.o. daily Verapamil 360 mg p.o. daily       HYPERCHOLESTEROLEMIA     Chronic, stable lipid control important in reducing the progression of atherosclerotic disease. Continue statin therapy  Atorvastatin 10 mg p.o. daily       Prediabetes     Chronic, stable.       PVD (peripheral vascular disease)    Chronic,  follwoed by vascular.  On plavix       Other Visit Diagnoses     Routine general medical examination at a health care facility    -  Primary      Kerby Nora, MD

## 2022-11-20 NOTE — Assessment & Plan Note (Addendum)
Chronic, stable lipid control important in reducing the progression of atherosclerotic disease. Continue statin therapy  Atorvastatin 10 mg p.o. daily

## 2022-11-20 NOTE — Assessment & Plan Note (Signed)
Stable, chronic.  Continue current medication. ? ?Losartan 25 mg p.o. daily ?Verapamil 360 mg p.o. daily ? ? ?

## 2022-11-20 NOTE — Assessment & Plan Note (Signed)
Chronic,  follwoed by vascular.  On plavix

## 2022-11-20 NOTE — Assessment & Plan Note (Signed)
Chronic, stable 

## 2022-11-20 NOTE — Assessment & Plan Note (Signed)
Chronic, severe ? ?Followed by pulmonary on controllers and rescue medication. ?

## 2022-11-22 ENCOUNTER — Ambulatory Visit: Payer: Medicare HMO | Admitting: Dermatology

## 2022-11-22 ENCOUNTER — Encounter: Payer: Self-pay | Admitting: Dermatology

## 2022-11-22 VITALS — BP 130/60

## 2022-11-22 DIAGNOSIS — D0471 Carcinoma in situ of skin of right lower limb, including hip: Secondary | ICD-10-CM

## 2022-11-22 DIAGNOSIS — C44722 Squamous cell carcinoma of skin of right lower limb, including hip: Secondary | ICD-10-CM

## 2022-11-22 DIAGNOSIS — D099 Carcinoma in situ, unspecified: Secondary | ICD-10-CM

## 2022-11-22 DIAGNOSIS — L570 Actinic keratosis: Secondary | ICD-10-CM

## 2022-11-22 DIAGNOSIS — Z85828 Personal history of other malignant neoplasm of skin: Secondary | ICD-10-CM

## 2022-11-22 DIAGNOSIS — D485 Neoplasm of uncertain behavior of skin: Secondary | ICD-10-CM

## 2022-11-22 NOTE — Progress Notes (Signed)
Follow-Up Visit   Subjective  Misty Ferguson is a 89 y.o. female who presents for the following: recheck SCCis at right pretibia, treated with imiquimod five days a week x 6 weeks with no reaction per patient.   Patient accompanied by husband who contributes to history.  The following portions of the chart were reviewed this encounter and updated as appropriate: medications, allergies, medical history  Review of Systems:  No other skin or systemic complaints except as noted in HPI or Assessment and Plan.  Objective  Well appearing patient in no apparent distress; mood and affect are within normal limits.   A focused examination was performed of the following areas: Right leg  Relevant exam findings are noted in the Assessment and Plan.  right pretibia Scaly pink plaque  right pretibia 3.5 cm scaly pink plaque R/o SCC  Hx of SCCis s/p imiquimod       right thigh 0.9 cm firm pink papule R/o SCC         Assessment & Plan   ACTINIC KERATOSIS Exam: Erythematous thin papules/macules with gritty scale  Actinic keratoses are precancerous spots that appear secondary to cumulative UV radiation exposure/sun exposure over time. They are chronic with expected duration over 1 year. A portion of actinic keratoses will progress to squamous cell carcinoma of the skin. It is not possible to reliably predict which spots will progress to skin cancer and so treatment is recommended to prevent development of skin cancer.  Recommend daily broad spectrum sunscreen SPF 30+ to sun-exposed areas, reapply every 2 hours as needed.  Recommend staying in the shade or wearing long sleeves, sun glasses (UVA+UVB protection) and wide brim hats (4-inch brim around the entire circumference of the hat). Call for new or changing lesions.  Treatment Plan:  Prior to procedure, discussed risks of blister formation, small wound, skin dyspigmentation, or rare scar following cryotherapy. Recommend  Vaseline ointment to treated areas while healing.  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 No reaction per patient and with residual on exam.  Discussed treating with 5FU or longer imiquimod after removing scale vs EDC vs Mohs vs radiation. Will repeat biopsy to assess for invasive component and then determine best treatment option. She does not feel she is up for a larger wound at her leg/surgery.   Could also consider IL 5FU or MTX or 5FU wrap   Neoplasm of uncertain behavior of skin (2) right pretibia  Skin / nail biopsy Type of biopsy: punch   Informed consent: discussed and consent obtained   Timeout: patient name, date of birth, surgical site, and procedure verified   Procedure prep:  Patient was prepped and draped in usual sterile fashion Prep type:  Isopropyl alcohol Anesthesia: the lesion was anesthetized in a standard fashion   Anesthetic:  1% lidocaine w/ epinephrine 1-100,000 buffered w/ 8.4% NaHCO3 Punch size:  3.5 mm Suture size:  4-0 Suture type: Vicryl Rapide (coated polyglactin 910)   Hemostasis achieved with: suture   Outcome: patient tolerated procedure well   Post-procedure details: wound care instructions given   Additional details:  Petrolatum and a pressure dressing were applied  Specimen 1 - Surgical pathology Differential Diagnosis: R/o SCC   Check Margins: No 3.5 cm scaly pink plaque Hx of SCCis s/p imiquimod  right thigh  Epidermal / dermal shaving  Lesion diameter (cm):  1.1 Informed consent: discussed and consent obtained    Timeout: patient name, date of birth, surgical site, and procedure verified   Anesthesia: the lesion was anesthetized in a standard fashion   Anesthetic:  1% lidocaine w/ epinephrine 1-100,000 local infiltration Instrument used: DermaBlade   Hemostasis achieved with: aluminum chloride   Outcome: patient tolerated procedure well   Post-procedure details: wound care instructions given   Additional details:  Mupirocin and a bandage applied  Destruction of lesion  Destruction method: electrodesiccation and curettage   Informed consent: discussed and consent obtained   Timeout:  patient name, date of birth, surgical site, and procedure verified Anesthesia: the lesion was anesthetized in a standard fashion   Anesthetic:  1% lidocaine w/ epinephrine 1-100,000 buffered w/ 8.4% NaHCO3 Curettage performed in three different directions: Yes   Electrodesiccation performed over the curetted area: Yes   Curettage cycles:  3 Final wound size (cm):  1.6 Hemostasis achieved with:  electrodesiccation Outcome: patient tolerated procedure well with no complications   Post-procedure details: sterile dressing applied and wound care instructions given   Dressing type: petrolatum    Specimen 2 - Surgical pathology Differential Diagnosis: R/o SCC  Check Margins: No 0.9 cm firm pink papule Treated with EDC  HISTORY OF SQUAMOUS CELL CARCINOMA OF THE SKIN - No evidence of recurrence today at left calf - Recommend regular full body skin exams - Recommend daily broad spectrum sunscreen SPF 30+ to sun-exposed areas, reapply every 2 hours as needed.  - Call if any new or changing lesions are noted between office visits  Patient prefers not to have surgery and not to have radiation if biopsy sites come back skin cancer.   Return for 5-6 week follow up.  Anise SalvoI,Jackie Stephens, RMA, am acting as scribe for Darden DatesVIRGINA Nuala Chiles, MD .   Documentation: I have reviewed the above documentation for accuracy and  completeness, and I agree with the above.  Darden DatesVIRGINA Alesana Magistro, MD

## 2022-11-22 NOTE — Patient Instructions (Signed)
Wound Care Instructions  Cleanse wound gently with soap and water once a day then pat dry with clean gauze. Apply a thin coat of Petrolatum (petroleum jelly, "Vaseline") over the wound (unless you have an allergy to this). We recommend that you use a new, sterile tube of Vaseline. Do not pick or remove scabs. Do not remove the yellow or white "healing tissue" from the base of the wound.  Cover the wound with fresh, clean, nonstick gauze and secure with paper tape. You may use Band-Aids in place of gauze and tape if the wound is small enough, but would recommend trimming much of the tape off as there is often too much. Sometimes Band-Aids can irritate the skin.  You should call the office for your biopsy report after 1 week if you have not already been contacted.  If you experience any problems, such as abnormal amounts of bleeding, swelling, significant bruising, significant pain, or evidence of infection, please call the office immediately.  FOR ADULT SURGERY PATIENTS: If you need something for pain relief you may take 1 extra strength Tylenol (acetaminophen) AND 2 Ibuprofen (200mg each) together every 4 hours as needed for pain. (do not take these if you are allergic to them or if you have a reason you should not take them.) Typically, you may only need pain medication for 1 to 3 days.     Due to recent changes in healthcare laws, you may see results of your pathology and/or laboratory studies on MyChart before the doctors have had a chance to review them. We understand that in some cases there may be results that are confusing or concerning to you. Please understand that not all results are received at the same time and often the doctors may need to interpret multiple results in order to provide you with the best plan of care or course of treatment. Therefore, we ask that you please give us 2 business days to thoroughly review all your results before contacting the office for clarification. Should  we see a critical lab result, you will be contacted sooner.   If You Need Anything After Your Visit  If you have any questions or concerns for your doctor, please call our main line at [phone] and press option 4 to reach your doctor's medical assistant. If no one answers, please leave a voicemail as directed and we will return your call as soon as possible. Messages left after 4 pm will be answered the following business day.   You may also send us a message via MyChart. We typically respond to MyChart messages within 1-2 business days.  For prescription refills, please ask your pharmacy to contact our office. Our fax number is [phone].  If you have an urgent issue when the clinic is closed that cannot wait until the next business day, you can page your doctor at the number below.    Please note that while we do our best to be available for urgent issues outside of office hours, we are not available 24/7.   If you have an urgent issue and are unable to reach us, you may choose to seek medical care at your doctor's office, retail clinic, urgent care center, or emergency room.  If you have a medical emergency, please immediately call 911 or go to the emergency department.  Pager Numbers  - Dr. Kowalski: [phone]  - Dr. Moye: [phone]  - Dr. Stewart: [phone]  In the event of inclement weather, please call our main line at   [phone] for an update on the status of any delays or closures.  Dermatology Medication Tips: Please keep the boxes that topical medications come in in order to help keep track of the instructions about where and how to use these. Pharmacies typically print the medication instructions only on the boxes and not directly on the medication tubes.   If your medication is too expensive, please contact our office at [phone] option 4 or send us a message through MyChart.   We are unable to tell what your co-pay for medications will be in  advance as this is different depending on your insurance coverage. However, we may be able to find a substitute medication at lower cost or fill out paperwork to get insurance to cover a needed medication.   If a prior authorization is required to get your medication covered by your insurance company, please allow us 1-2 business days to complete this process.  Drug prices often vary depending on where the prescription is filled and some pharmacies may offer cheaper prices.  The website www.goodrx.com contains coupons for medications through different pharmacies. The prices here do not account for what the cost may be with help from insurance (it may be cheaper with your insurance), but the website can give you the price if you did not use any insurance.  - You can print the associated coupon and take it with your prescription to the pharmacy.  - You may also stop by our office during regular business hours and pick up a GoodRx coupon card.  - If you need your prescription sent electronically to a different pharmacy, notify our office through [facility] MyChart or by phone at [phone] option 4.     Si Usted Necesita Algo Despus de Su Visita  Tambin puede enviarnos un mensaje a travs de MyChart. Por lo general respondemos a los mensajes de MyChart en el transcurso de 1 a 2 das hbiles.  Para renovar recetas, por favor pida a su farmacia que se ponga en contacto con nuestra oficina. Nuestro nmero de fax es el [phone].  Si tiene un asunto urgente cuando la clnica est cerrada y que no puede esperar hasta el siguiente da hbil, puede llamar/localizar a su doctor(a) al nmero que aparece a continuacin.   Por favor, tenga en cuenta que aunque hacemos todo lo posible para estar disponibles para asuntos urgentes fuera del horario de oficina, no estamos disponibles las 24 horas del da, los 7 das de la semana.   Si tiene un problema urgente y no puede comunicarse con nosotros, puede  optar por buscar atencin mdica  en el consultorio de su doctor(a), en una clnica privada, en un centro de atencin urgente o en una sala de emergencias.  Si tiene una emergencia mdica, por favor llame inmediatamente al 911 o vaya a la sala de emergencias.  Nmeros de bper  - Dr. Kowalski: [phone]  - Dra. Moye: [phone]  - Dra. Stewart: [phone]  En caso de inclemencias del tiempo, por favor llame a nuestra lnea principal al [phone] para una actualizacin sobre el estado de cualquier retraso o cierre.  Consejos para la medicacin en dermatologa: Por favor, guarde las cajas en las que vienen los medicamentos de uso tpico para ayudarle a seguir las instrucciones sobre dnde y cmo usarlos. Las farmacias generalmente imprimen las instrucciones del medicamento slo en las cajas y no directamente en los tubos del medicamento.   Si su medicamento es muy caro, por favor, pngase en contacto con   nuestra oficina llamando al [phone] y presione la opcin 4 o envenos un mensaje a travs de MyChart.   No podemos decirle cul ser su copago por los medicamentos por adelantado ya que esto es diferente dependiendo de la cobertura de su seguro. Sin embargo, es posible que podamos encontrar un medicamento sustituto a menor costo o llenar un formulario para que el seguro cubra el medicamento que se considera necesario.   Si se requiere una autorizacin previa para que su compaa de seguros cubra su medicamento, por favor permtanos de 1 a 2 das hbiles para completar este proceso.  Los precios de los medicamentos varan con frecuencia dependiendo del lugar de dnde se surte la receta y alguna farmacias pueden ofrecer precios ms baratos.  El sitio web www.goodrx.com tiene cupones para medicamentos de diferentes farmacias. Los precios aqu no tienen en cuenta lo que podra costar con la ayuda del seguro (puede ser ms barato con su seguro), pero el sitio web puede darle el  precio si no utiliz ningn seguro.  - Puede imprimir el cupn correspondiente y llevarlo con su receta a la farmacia.  - Tambin puede pasar por nuestra oficina durante el horario de atencin regular y recoger una tarjeta de cupones de GoodRx.  - Si necesita que su receta se enve electrnicamente a una farmacia diferente, informe a nuestra oficina a travs de MyChart de [place] o por telfono llamando al [phone] y presione la opcin 4.  

## 2022-11-29 ENCOUNTER — Telehealth: Payer: Self-pay

## 2022-11-29 DIAGNOSIS — D099 Carcinoma in situ, unspecified: Secondary | ICD-10-CM

## 2022-11-29 MED ORDER — FLUOROURACIL 5 % EX CREA: TOPICAL_CREAM | CUTANEOUS | 0 refills | Status: DC

## 2022-11-29 NOTE — Telephone Encounter (Addendum)
Called and spoke with patient regarding bx results and treatment recommendations for right pretibia. Sent rx of fluorouracil cream to [facility]. Patient given pharmacy information and phone number. She verbalized understanding and denied further questions.       ----- Message from Sandi Mealy, MD sent at 11/29/2022  4:50 PM EDT ----- 1. Skin , right pretibia SQUAMOUS CELL CARCINOMA IN SITU ARISING IN ACTINIC KERATOSIS --> failed imiquimod. She does not want a procedure or Mohs. Recommend trying fluorouracil cream twice a day for 4 weeks ([facility]), recheck at f/u. This will likely cause the area to become more crusted and inflamed.   2. Skin , right thigh SQUAMOUS CELL CARCINOMA, KERATOACANTHOMA TYPE --> already treated with ED&C, recheck at f/u  MAs please call. Thank you!

## 2022-12-03 ENCOUNTER — Encounter: Payer: Self-pay | Admitting: Internal Medicine

## 2022-12-03 MED ORDER — AZELASTINE HCL 0.1 % NA SOLN: 2.0000 | Freq: Two times a day (BID) | NASAL | 5 refills | Status: DC

## 2022-12-11 ENCOUNTER — Other Ambulatory Visit: Payer: Medicare HMO

## 2022-12-17 ENCOUNTER — Ambulatory Visit: Payer: Medicare HMO | Admitting: Podiatry

## 2022-12-27 ENCOUNTER — Ambulatory Visit: Payer: Medicare HMO | Admitting: Dermatology

## 2022-12-27 ENCOUNTER — Other Ambulatory Visit (INDEPENDENT_AMBULATORY_CARE_PROVIDER_SITE_OTHER): Payer: Self-pay | Admitting: Nurse Practitioner

## 2022-12-27 VITALS — BP 131/64 | HR 80

## 2022-12-27 DIAGNOSIS — D099 Carcinoma in situ, unspecified: Secondary | ICD-10-CM

## 2022-12-27 DIAGNOSIS — W908XXA Exposure to other nonionizing radiation, initial encounter: Secondary | ICD-10-CM

## 2022-12-27 DIAGNOSIS — X32XXXA Exposure to sunlight, initial encounter: Secondary | ICD-10-CM

## 2022-12-27 DIAGNOSIS — Z872 Personal history of diseases of the skin and subcutaneous tissue: Secondary | ICD-10-CM

## 2022-12-27 DIAGNOSIS — Z85828 Personal history of other malignant neoplasm of skin: Secondary | ICD-10-CM

## 2022-12-27 DIAGNOSIS — Z5111 Encounter for antineoplastic chemotherapy: Secondary | ICD-10-CM

## 2022-12-27 DIAGNOSIS — L578 Other skin changes due to chronic exposure to nonionizing radiation: Secondary | ICD-10-CM | POA: Diagnosis not present

## 2022-12-27 DIAGNOSIS — D0471 Carcinoma in situ of skin of right lower limb, including hip: Secondary | ICD-10-CM | POA: Diagnosis not present

## 2022-12-27 DIAGNOSIS — I739 Peripheral vascular disease, unspecified: Secondary | ICD-10-CM

## 2022-12-27 NOTE — Patient Instructions (Addendum)
Apply vaseline to right thigh for a few more days at affected area.   For wound at right lower leg (right pretibia)   Hold fluorouracil 5 % cream for 1 week  Can use Recommend [facility] Hypochlorous Spray (found in the wound care section)  Continue Silvadene cream - to affected area open area daily   After 1 week continue fluorouracil 5 % cream  apply topically twice daily  for 3 more weeks.     Due to recent changes in healthcare laws, you may see results of your pathology and/or laboratory studies on MyChart before the doctors have had a chance to review them. We understand that in some cases there may be results that are confusing or concerning to you. Please understand that not all results are received at the same time and often the doctors may need to interpret multiple results in order to provide you with the best plan of care or course of treatment. Therefore, we ask that you please give [place] 2 business days to thoroughly review all your results before contacting the office for clarification. Should we see a critical lab result, you will be contacted sooner.   If You Need Anything After Your Visit  If you have any questions or concerns for your doctor, please call our main line at [phone] and press option 4 to reach your doctor's medical assistant. If no one answers, please leave a voicemail as directed and we will return your call as soon as possible. Messages left after 4 pm will be answered the following business day.   You may also send [place] a message via MyChart. We typically respond to MyChart messages within 1-2 business days.  For prescription refills, please ask your pharmacy to contact our office. Our fax number is [phone].  If you have an urgent issue when the clinic is closed that cannot wait until the next business day, you can page your doctor at the number below.    Please note that while we do our best to be available for urgent issues outside of office hours,  we are not available 24/7.   If you have an urgent issue and are unable to reach [place], you may choose to seek medical care at your doctor's office, retail clinic, urgent care center, or emergency room.  If you have a medical emergency, please immediately call 911 or go to the emergency department.  Pager Numbers  - Dr. Gwen Pounds: [phone]  - Dr. Neale Burly: [phone]  - Dr. Roseanne Reno: [phone]  In the event of inclement weather, please call our main line at [phone] for an update on the status of any delays or closures.  Dermatology Medication Tips: Please keep the boxes that topical medications come in in order to help keep track of the instructions about where and how to use these. Pharmacies typically print the medication instructions only on the boxes and not directly on the medication tubes.   If your medication is too expensive, please contact our office at [phone] option 4 or send [place] a message through MyChart.   We are unable to tell what your co-pay for medications will be in advance as this is different depending on your insurance coverage. However, we may be able to find a substitute medication at lower cost or fill out paperwork to get insurance to cover a needed medication.   If a prior authorization is required to get your medication covered by your insurance company, please allow [place] 1-2 business days to complete this  process.  Drug prices often vary depending on where the prescription is filled and some pharmacies may offer cheaper prices.  The website www.goodrx.com contains coupons for medications through different pharmacies. The prices here do not account for what the cost may be with help from insurance (it may be cheaper with your insurance), but the website can give you the price if you did not use any insurance.  - You can print the associated coupon and take it with your prescription to the pharmacy.  - You may also stop by our office during regular  business hours and pick up a GoodRx coupon card.  - If you need your prescription sent electronically to a different pharmacy, notify our office through [facility] or by phone at [phone] option 4.     Si Usted Necesita Algo Despus de Su Visita  Tambin puede enviarnos un mensaje a travs de Clinical cytogeneticist. Por lo general respondemos a los mensajes de MyChart en el transcurso de 1 a 2 das hbiles.  Para renovar recetas, por favor pida a su farmacia que se ponga en contacto con nuestra oficina. Annie Sable de fax es [place] [phone].  Si tiene un asunto urgente cuando la clnica est cerrada y que no puede esperar hasta el siguiente da hbil, puede llamar/localizar a su doctor(a) al nmero que aparece a continuacin.   Por favor, tenga en cuenta que aunque hacemos todo lo posible para estar disponibles para asuntos urgentes fuera del horario de [place], no estamos disponibles las 24 horas del da, los 7 [address]  [address] de la [place].   Si tiene un problema urgente y no puede comunicarse con nosotros, puede optar por buscar atencin mdica  en el consultorio de su doctor(a), en una clnica privada, en un centro de atencin urgente o en una sala de emergencias.  Si tiene Engineer, drilling, por favor llame inmediatamente al 911 o vaya a la sala de emergencias.  Nmeros de bper  - Dr. Gwen Pounds: [phone]  - Dra. Moye: [phone]  - Dra. Roseanne Reno: [phone]  En caso de inclemencias del [place], por favor llame a Lacy Duverney principal al [phone] para una actualizacin sobre el [place] de cualquier retraso o cierre.  Consejos para la medicacin en dermatologa: Por favor, guarde las cajas en las que vienen los medicamentos de uso tpico para ayudarle a seguir las instrucciones sobre dnde y cmo usarlos. Las farmacias generalmente imprimen las instrucciones del medicamento slo en las cajas y no directamente en los tubos del [place].   Si su medicamento es muy caro, por  favor, pngase en contacto con Rolm [place]ala llamando al [phone] y presione la opcin 4 o envenos un mensaje a travs de Clinical cytogeneticist.   No podemos decirle cul ser su copago por los medicamentos por adelantado ya que esto es diferente dependiendo de la cobertura de su seguro. Sin embargo, es posible que podamos encontrar un medicamento sustituto a Audiological scientist un formulario para que el seguro cubra el medicamento que se considera necesario.   Si se requiere una autorizacin previa para que su compaa de seguros [place] su medicamento, por favor permtanos de 1 a 2 das hbiles para completar [address]  Los precios de los medicamentos varan con frecuencia dependiendo del Environmental consultant de dnde se surte la receta y alguna farmacias pueden ofrecer precios ms baratos.  El sitio web www.goodrx.com tiene cupones para medicamentos de Health and safety inspector. Los precios aqu no tienen en cuenta lo que podra costar con la ayuda del seguro (puede  ser ms barato con su seguro), pero el sitio web puede darle el precio si no Field seismologist.  - Puede imprimir el cupn correspondiente y llevarlo con su receta a la farmacia.  - Tambin puede pasar por nuestra oficina durante el horario de atencin regular y Charity fundraiser una tarjeta de cupones de GoodRx.  - Si necesita que su receta se enve electrnicamente a una farmacia diferente, informe a nuestra oficina a travs de MyChart de [place] o por telfono llamando al [phone] y presione la opcin 4.

## 2022-12-27 NOTE — Progress Notes (Signed)
Follow-Up Visit   Subjective  Misty Ferguson is a 89 y.o. female who presents for the following: 4 more days of cream imiquimod right   Recheck sccis at right pretibia, bx proven, treated with f/u treatment cream to aa bid for 4 weeks. Patient reports only has 4 days left of using cream. Recheck bx at right thigh Hx of ak at left medial thigh treated with Ln2   The following portions of the chart were reviewed this encounter and updated as appropriate: medications, allergies, medical history  Review of Systems:  No other skin or systemic complaints except as noted in HPI or Assessment and Plan.  Objective  Well appearing patient in no apparent distress; mood and affect are within normal limits.   A focused examination was performed of the following areas: Right pretibia, right thigh, left medial thigh  Relevant exam findings are noted in the Assessment and Plan.  right pretibia scaly pink plaque     Assessment & Plan    Squamous cell carcinoma in situ right pretibia  Bx proven SCCis   Used fluorouracil 5 % cream to aa to treat for 3.5 weeks  Has improved but not at goal  Chronic and persistent condition with duration or expected duration over one year. Condition is symptomatic/ bothersome to patient. Not currently at goal.  Debridement today. During debriding, small area of skin tore.  Cleaned with puracyn. Steri strips applied to tear, leave on for 1 week to help with healing  Hold fluorouracil 5 % cream for 1 week then restart twice a day for 3 weeks.  If not clearing with topical 5-FU, consider intralesional 5FU  Start hypochlorous spray -  clean open areas daily  Continue silvadene cream - apply to open area without steri strips daily  Can keep covered with water proof bandage if desired while showering for the next week to keep steristrips in place  Recheck in 3 weeks    Related Medications fluorouracil (EFUDEX) 5 % cream Apply topically to aa of  right leg twice daily for 4 weeks  Encounter for chemotherapy management  ACTINIC DAMAGE - chronic, secondary to cumulative UV radiation exposure/sun exposure over time - diffuse scaly erythematous macules with underlying dyspigmentation - Recommend daily broad spectrum sunscreen SPF 30+ to sun-exposed areas, reapply every 2 hours as needed.  - Recommend staying in the shade or wearing long sleeves, sun glasses (UVA+UVB protection) and wide brim hats (4-inch brim around the entire circumference of the hat). - Call for new or changing lesions.  HISTORY OF PRECANCEROUS ACTINIC KERATOSIS Left medial thigh - site(s) of PreCancerous Actinic Keratosis clear today. - these may recur and new lesions may form requiring treatment to prevent transformation into skin cancer - observe for new or changing spots and contact [facility] for appointment if occur - photoprotection with sun protective clothing; sunglasses and broad spectrum sunscreen with SPF of at least 30 + and frequent self skin exams recommended - yearly exams by a dermatologist recommended for persons with history of PreCancerous Actinic Keratoses  HISTORY OF SQUAMOUS CELL CARCINOMA OF THE SKIN - No evidence of recurrence today - No lymphadenopathy - Recommend regular full body skin exams - Recommend daily broad spectrum sunscreen SPF 30+ to sun-exposed areas, reapply every 2 hours as needed.  - Call if any new or changing lesions are noted between office visits   Return for 3 week recheck right pretibia .  I, Asher Muir, CMA, am acting as scribe for  Darden Dates, MD.   Documentation: I have reviewed the above documentation for accuracy and completeness, and I agree with the above.  Darden Dates, MD

## 2023-01-01 ENCOUNTER — Ambulatory Visit (INDEPENDENT_AMBULATORY_CARE_PROVIDER_SITE_OTHER): Payer: Medicare HMO

## 2023-01-01 ENCOUNTER — Encounter (INDEPENDENT_AMBULATORY_CARE_PROVIDER_SITE_OTHER): Payer: Self-pay | Admitting: Vascular Surgery

## 2023-01-01 ENCOUNTER — Ambulatory Visit (INDEPENDENT_AMBULATORY_CARE_PROVIDER_SITE_OTHER): Payer: Medicare HMO | Admitting: Vascular Surgery

## 2023-01-01 VITALS — BP 149/69 | HR 89 | Resp 15

## 2023-01-01 DIAGNOSIS — Z9889 Other specified postprocedural states: Secondary | ICD-10-CM

## 2023-01-01 DIAGNOSIS — I739 Peripheral vascular disease, unspecified: Secondary | ICD-10-CM

## 2023-01-01 DIAGNOSIS — I10 Essential (primary) hypertension: Secondary | ICD-10-CM | POA: Diagnosis not present

## 2023-01-01 DIAGNOSIS — E7800 Pure hypercholesterolemia, unspecified: Secondary | ICD-10-CM

## 2023-01-01 DIAGNOSIS — R7303 Prediabetes: Secondary | ICD-10-CM | POA: Diagnosis not present

## 2023-01-01 NOTE — Progress Notes (Signed)
MRN : [id number]  Misty Ferguson is a 89 y.o. (September 06, 1933) female who presents with chief complaint of  Chief Complaint  Patient presents with   Follow-up    Ultrasound follow up  .  History of Present Illness: Patient returns today in follow up of her PAD.  She is doing reasonably well.  She has had to get some skin cancers taken off of her legs but ultimately they have healed.  She has a lot of darkening of her skin and discoloration.  She does not really describe any pain per se.  She remains fairly active.  Her ABIs today are stable at 0.88 on the right and 0.83 on the left with triphasic waveforms and digit pressures of 94 on the right and 87 on the left.  Duplex does show stenosis in the left SFA.  Current Outpatient Medications  Medication Sig Dispense Refill   albuterol (VENTOLIN HFA) 108 (90 Base) MCG/ACT inhaler Inhale 2 puffs into the lungs every 6 (six) hours as needed for wheezing or shortness of breath. 8 g 0   ascorbic acid (VITAMIN C) 1000 MG tablet Take 1,000 mg by mouth daily.      atorvastatin (LIPITOR) 10 MG tablet TAKE 1 TABLET EVERY DAY 90 tablet 3   azelastine (ASTELIN) 0.1 % nasal spray Place 2 sprays into both nostrils 2 (two) times daily. Use in each nostril as directed 30 mL 5   budesonide-formoterol (SYMBICORT) 160-4.5 MCG/ACT inhaler Inhale 2 puffs into the lungs in the morning and at bedtime. 3 each 0   Cholecalciferol (VITAMIN D) 125 MCG (5000 UT) CAPS Take 5,000 Units by mouth daily.     clopidogrel (PLAVIX) 75 MG tablet TAKE 1 TABLET EVERY DAY 90 tablet 3   docusate sodium (COLACE) 100 MG capsule Take 100 mg by mouth 2 (two) times daily.     fluorouracil (EFUDEX) 5 % cream Apply topically to aa of right leg twice daily for 4 weeks 40 g 0   losartan (COZAAR) 25 MG tablet 25 mg daily.      Multiple Vitamins-Minerals (PRESERVISION AREDS) TABS Take 1 tablet by mouth 2 (two) times daily.     Omega-3 Fatty Acids (FISH OIL) 1000 MG CAPS Take 1,000 mg by mouth 2  (two) times daily.     OXYGEN Inhale 4 L into the lungs as needed.     verapamil (VERELAN PM) 360 MG 24 hr capsule Take 360 mg by mouth daily.     Vitamin E 180 MG CAPS Take 180 mg by mouth daily.     No current facility-administered medications for this visit.    Past Medical History:  Diagnosis Date   AK (actinic keratosis) 12/08/2020   right pretibia inferior bx proven, LN2 01/10/21   Burping    Chronic airway obstruction, not elsewhere classified    Dyspnea    Dysrhythmia    Macular degeneration (senile) of retina, unspecified    Malignant neoplasm of urinary bladder (HCC) 03/2019   Partial bladder resection and Rad tx's.    Obstructive chronic bronchitis with exacerbation (HCC)    Osteoporosis, unspecified    Oxygen deficiency    Personal history of peptic ulcer disease    SCC (squamous cell carcinoma) 12/08/2020   right pretibia superior, EDC 01/10/21   SCC (squamous cell carcinoma) 07/03/2022   left medial calf, clear 09/25/22   SCC (squamous cell carcinoma) 11/22/2022   right thigh   treated with ED&C   Squamous cell carcinoma in  situ (SCCIS) 07/03/2022   right pretibia sccis needs treatment with fluorouracil   Tobacco use disorder    Unspecified essential hypertension    Unspecified glaucoma(365.9)     Past Surgical History:  Procedure Laterality Date   CATARACT EXTRACTION W/ INTRAOCULAR LENS  IMPLANT, BILATERAL     CYSTOSCOPY WITH STENT PLACEMENT Left 05/22/2019   Procedure: CYSTOSCOPY WITH STENT PLACEMENT;  Surgeon: Sondra Come, MD;  Location: [facility];  Service: Urology;  Laterality: Left;   ELBOW FRACTURE SURGERY Left    LOWER EXTREMITY ANGIOGRAPHY Right 11/20/2021   Procedure: Lower Extremity Angiography;  Surgeon: Annice Needy, MD;  Location: [facility];  Service: Cardiovascular;  Laterality: Right;   LOWER EXTREMITY ANGIOGRAPHY Left 09/03/2022   Procedure: Lower Extremity Angiography;  Surgeon: Annice Needy, MD;  Location: [facility];   Service: Cardiovascular;  Laterality: Left;   TRANSURETHRAL RESECTION OF BLADDER TUMOR N/A 05/22/2019   Procedure: TRANSURETHRAL RESECTION OF BLADDER TUMOR (TURBT);  Surgeon: Sondra Come, MD;  Location: [facility];  Service: Urology;  Laterality: N/A;     Social History   Tobacco Use   Smoking status: Former    Packs/day: 1.30    Years: 45.00    Additional pack years: 0.00    Total pack years: 58.50    Types: Cigarettes    Quit date: 1998    Years since quitting: 26.4   Smokeless tobacco: Never  Vaping Use   Vaping Use: Never used  Substance Use Topics   Alcohol use: Yes    Alcohol/week: 14.0 standard drinks of alcohol    Types: 14 Glasses of wine per week    Comment: 1-2 wine daily   Drug use: No      Family History  Adopted: Yes     No Known Allergies  REVIEW OF SYSTEMS (Negative unless checked)   Constitutional: [] Weight loss  [] Fever  [] Chills Cardiac: [] Chest pain   [] Chest pressure   [] Palpitations   [] Shortness of breath when laying flat   [] Shortness of breath at rest   [] Shortness of breath with exertion. Vascular:  [] Pain in legs with walking   [] Pain in legs at rest   [] Pain in legs when laying flat   [] Claudication   [] Pain in feet when walking  [] Pain in feet at rest  [] Pain in feet when laying flat   [] History of DVT   [] Phlebitis   [x] Swelling in legs   [] Varicose veins   [] Non-healing ulcers Pulmonary:   [] Uses home oxygen   [] Productive cough   [] Hemoptysis   [] Wheeze  [] COPD   [] Asthma Neurologic:  [] Dizziness  [] Blackouts   [] Seizures   [] History of stroke   [] History of TIA  [] Aphasia   [] Temporary blindness   [] Dysphagia   [] Weakness or numbness in arms   [] Weakness or numbness in legs Musculoskeletal:  [x] Arthritis   [] Joint swelling   [x] Joint pain   [] Low back pain Hematologic:  [x] Easy bruising  [] Easy bleeding   [] Hypercoagulable state   [] Anemic   Gastrointestinal:  [] Blood in stool   [] Vomiting blood  [] Gastroesophageal reflux/heartburn    [] Abdominal pain Genitourinary:  [] Chronic kidney disease   [] Difficult urination  [] Frequent urination  [] Burning with urination   [] Hematuria Skin:  [] Rashes   [] Ulcers   [] Wounds Psychological:  [] History of anxiety   []  History of major depression.  Physical Examination  BP (!) 149/69 (BP Location: Left Arm)   Pulse 89   Resp 15  Gen:  WD/WN, NAD Head: [place]/AT, No temporalis wasting. Ear/Nose/Throat: Hearing grossly intact, nares w/o erythema or drainage Eyes: Conjunctiva clear. Sclera non-icteric Neck: Supple.  Trachea midline Pulmonary:  Good air movement, no use of accessory muscles.  Cardiac: irregular  Vascular:  Vessel Right Left  Radial Palpable Palpable                          PT Palpable Palpable  DP Palpable Palpable    Musculoskeletal: M/S 5/5 throughout.  No deformity or atrophy.  Marked bruising and stasis dermatitis changes are present to both lower extremities.  Skin is extremely thin.  No significant lower extremity edema. Neurologic: Sensation grossly intact in extremities.  Symmetrical.  Speech is fluent.  Psychiatric: Judgment intact, Mood & affect appropriate for pt's clinical situation. Dermatologic: No rashes or ulcers noted.  No cellulitis or open wounds.      Labs Recent Results (from the past 2160 hour(s))  VAS [place] ABI WITH/WO TBI     Status: None   Collection Time: 10/04/22  8:40 AM  Result Value Ref Range   Right ABI .88    Left ABI .91   Lipid panel     Status: Abnormal   Collection Time: 11/06/22  7:56 AM  Result Value Ref Range   Cholesterol 202 (H) 0 - 200 mg/dL    Comment: ATP III Classification       Desirable:  < 200 mg/dL               Borderline High:  200 - 239 mg/dL          High:  > = 604 mg/dL   Triglycerides 54.0 0.0 - 149.0 mg/dL    Comment: Normal:  <981 mg/dLBorderline High:  150 - 199 mg/dL   HDL 191.47 >82.95 mg/dL   VLDL 9.8 0.0 - 62.1 mg/dL   LDL Cholesterol 63 0 - 99 mg/dL   Total CHOL/HDL Ratio 2     Comment:                 Men          Women1/2 Average Risk     3.4          3.3Average Risk          5.0          4.42X Average Risk          9.6          7.13X Average Risk          15.0          11.0                       NonHDL 72.83     Comment: NOTE:  Non-HDL goal should be 30 mg/dL higher than patient's LDL goal (i.e. LDL goal of < 70 mg/dL, would have non-HDL goal of < 100 mg/dL)  Hemoglobin H0Q     Status: None   Collection Time: 11/06/22  7:56 AM  Result Value Ref Range   Hgb A1c MFr Bld 5.7 4.6 - 6.5 %    Comment: Glycemic Control Guidelines for People with Diabetes:Non Diabetic:  <6%Goal of Therapy: <7%Additional Action Suggested:  >8%   Comprehensive metabolic panel     Status: Abnormal   Collection Time: 11/06/22  7:56 AM  Result Value Ref Range   Sodium 141 135 - 145 mEq/L   Potassium 4.1  3.5 - 5.1 mEq/L   Chloride 102 96 - 112 mEq/L   CO2 30 19 - 32 mEq/L   Glucose, Bld 117 (H) 70 - 99 mg/dL   BUN 17 6 - 23 mg/dL   Creatinine, Ser 1.61 0.40 - 1.20 mg/dL   Total Bilirubin 0.4 0.2 - 1.2 mg/dL   Alkaline Phosphatase 57 39 - 117 U/L   AST 20 0 - 37 U/L   ALT 21 0 - 35 U/L   Total Protein 6.5 6.0 - 8.3 g/dL   Albumin 4.4 3.5 - 5.2 g/dL   GFR 09.60 >45.40 mL/min    Comment: Calculated using the CKD-EPI Creatinine Equation (2021)   Calcium 9.5 8.4 - 10.5 mg/dL    Radiology No results found.  Assessment/Plan Essential hypertension, benign blood pressure control important in reducing the progression of atherosclerotic disease. On appropriate oral medications.     HYPERCHOLESTEROLEMIA lipid control important in reducing the progression of atherosclerotic disease. Continue statin therapy     Prediabetes blood glucose control important in reducing the progression of atherosclerotic disease. Also, involved in wound healing. On appropriate medications.  PVD (peripheral vascular disease) (HCC) Her ABIs today are stable at 0.88 on the right and 0.83 on the left with triphasic  waveforms and digit pressures of 94 on the right and 87 on the left.  Duplex does show stenosis in the left SFA.  No current worrisome or limb threatening symptoms.  No role for intervention at current.  Would recommend follow-up in 6 months with noninvasive studies.  Doing much better after revascularization about 4 months ago.    Festus Barren, MD  01/01/2023 5:00 PM    This note was created with Dragon medical transcription system.  Any errors from dictation are purely unintentional

## 2023-01-01 NOTE — Assessment & Plan Note (Signed)
Her ABIs today are stable at 0.88 on the right and 0.83 on the left with triphasic waveforms and digit pressures of 94 on the right and 87 on the left.  Duplex does show stenosis in the left SFA.  No current worrisome or limb threatening symptoms.  No role for intervention at current.  Would recommend follow-up in 6 months with noninvasive studies.  Doing much better after revascularization about 4 months ago.

## 2023-01-02 ENCOUNTER — Ambulatory Visit: Payer: Medicare HMO | Admitting: Podiatry

## 2023-01-02 ENCOUNTER — Encounter: Payer: Self-pay | Admitting: Podiatry

## 2023-01-02 DIAGNOSIS — M79676 Pain in unspecified toe(s): Secondary | ICD-10-CM | POA: Diagnosis not present

## 2023-01-02 DIAGNOSIS — B351 Tinea unguium: Secondary | ICD-10-CM | POA: Diagnosis not present

## 2023-01-02 NOTE — Progress Notes (Signed)
Subjective:  Patient ID: Misty Ferguson, female    DOB: 1933/10/09,  MRN: [id number] HPI Chief Complaint  Patient presents with   Nail Problem    Hallux bilateral - thick, discolored nails x 8 months, can't trim herself, has neuropathy, thought she may have had a gout flare but not active currently    New Patient (Initial Visit)    89 y.o. female presents with the above complaint.   ROS: Denies fever chills nausea vomit muscle aches pains calf pain back pain chest pain shortness of breath.  Has a history of COPD heart disease vascular disease bilateral lower extremity dermatological issues as well.  Past Medical History:  Diagnosis Date   AK (actinic keratosis) 12/08/2020   right pretibia inferior bx proven, LN2 01/10/21   Burping    Chronic airway obstruction, not elsewhere classified    Dyspnea    Dysrhythmia    Macular degeneration (senile) of retina, unspecified    Malignant neoplasm of urinary bladder (HCC) 03/2019   Partial bladder resection and Rad tx's.    Obstructive chronic bronchitis with exacerbation (HCC)    Osteoporosis, unspecified    Oxygen deficiency    Personal history of peptic ulcer disease    SCC (squamous cell carcinoma) 12/08/2020   right pretibia superior, EDC 01/10/21   SCC (squamous cell carcinoma) 07/03/2022   left medial calf, clear 09/25/22   SCC (squamous cell carcinoma) 11/22/2022   right thigh   treated with ED&C   Squamous cell carcinoma in situ (SCCIS) 07/03/2022   right pretibia sccis needs treatment with fluorouracil   Tobacco use disorder    Unspecified essential hypertension    Unspecified glaucoma(365.9)    Past Surgical History:  Procedure Laterality Date   CATARACT EXTRACTION W/ INTRAOCULAR LENS  IMPLANT, BILATERAL     CYSTOSCOPY WITH STENT PLACEMENT Left 05/22/2019   Procedure: CYSTOSCOPY WITH STENT PLACEMENT;  Surgeon: Sondra Come, MD;  Location: [facility];  Service: Urology;  Laterality: Left;   ELBOW FRACTURE SURGERY Left     LOWER EXTREMITY ANGIOGRAPHY Right 11/20/2021   Procedure: Lower Extremity Angiography;  Surgeon: Annice Needy, MD;  Location: [facility];  Service: Cardiovascular;  Laterality: Right;   LOWER EXTREMITY ANGIOGRAPHY Left 09/03/2022   Procedure: Lower Extremity Angiography;  Surgeon: Annice Needy, MD;  Location: [facility];  Service: Cardiovascular;  Laterality: Left;   TRANSURETHRAL RESECTION OF BLADDER TUMOR N/A 05/22/2019   Procedure: TRANSURETHRAL RESECTION OF BLADDER TUMOR (TURBT);  Surgeon: Sondra Come, MD;  Location: [facility];  Service: Urology;  Laterality: N/A;    Current Outpatient Medications:    albuterol (VENTOLIN HFA) 108 (90 Base) MCG/ACT inhaler, Inhale 2 puffs into the lungs every 6 (six) hours as needed for wheezing or shortness of breath., Disp: 8 g, Rfl: 0   ascorbic acid (VITAMIN C) 1000 MG tablet, Take 1,000 mg by mouth daily. , Disp: , Rfl:    atorvastatin (LIPITOR) 10 MG tablet, TAKE 1 TABLET EVERY DAY, Disp: 90 tablet, Rfl: 3   azelastine (ASTELIN) 0.1 % nasal spray, Place 2 sprays into both nostrils 2 (two) times daily. Use in each nostril as directed, Disp: 30 mL, Rfl: 5   budesonide-formoterol (SYMBICORT) 160-4.5 MCG/ACT inhaler, Inhale 2 puffs into the lungs in the morning and at bedtime., Disp: 3 each, Rfl: 0   Cholecalciferol (VITAMIN D) 125 MCG (5000 UT) CAPS, Take 5,000 Units by mouth daily., Disp: , Rfl:    clopidogrel (PLAVIX) 75 MG  tablet, TAKE 1 TABLET EVERY DAY, Disp: 90 tablet, Rfl: 3   docusate sodium (COLACE) 100 MG capsule, Take 100 mg by mouth 2 (two) times daily., Disp: , Rfl:    fluorouracil (EFUDEX) 5 % cream, Apply topically to aa of right leg twice daily for 4 weeks, Disp: 40 g, Rfl: 0   losartan (COZAAR) 25 MG tablet, 25 mg daily. , Disp: , Rfl:    Multiple Vitamins-Minerals (PRESERVISION AREDS) TABS, Take 1 tablet by mouth 2 (two) times daily., Disp: , Rfl:    Omega-3 Fatty Acids (FISH OIL) 1000 MG CAPS, Take 1,000 mg by  mouth 2 (two) times daily., Disp: , Rfl:    OXYGEN, Inhale 4 L into the lungs as needed., Disp: , Rfl:    verapamil (VERELAN PM) 360 MG 24 hr capsule, Take 360 mg by mouth daily., Disp: , Rfl:    Vitamin E 180 MG CAPS, Take 180 mg by mouth daily., Disp: , Rfl:   No Known Allergies Review of Systems Objective:  There were no vitals filed for this visit.  General: Well developed, nourished, in no acute distress, alert and oriented x3   Dermatological: Skin is warm, dry and supple bilateral. Nails x 10 are well maintained; remaining integument appears unremarkable at this time. There are no open sores, no preulcerative lesions, no rash or signs of infection present.  Vascular: Dorsalis Pedis artery and Posterior Tibial artery pedal pulses are 2/4 bilateral with immedate capillary fill time. Pedal hair growth present. No varicosities and no lower extremity edema present bilateral.   Neruologic: Grossly intact via light touch bilateral. Vibratory intact via tuning fork bilateral. Protective threshold with Semmes Wienstein monofilament intact to all pedal sites bilateral. Patellar and Achilles deep tendon reflexes 2+ bilateral. No Babinski or clonus noted bilateral.   Musculoskeletal: No gross boney pedal deformities bilateral. No pain, crepitus, or limitation noted with foot and ankle range of motion bilateral. Muscular strength 5/5 in all groups tested bilateral.  Gait: Unassisted, Nonantalgic.    Radiographs: None taken  Assessment & Plan:   Assessment: Pain limb secondary to onychomycosis  Plan: She will follow-up with Dr. Donzetta Matters.  Debrided nails 1 through 5 bilateral.      Adilenne Ashworth T. [place], [place]

## 2023-01-03 ENCOUNTER — Encounter: Payer: Medicare HMO | Admitting: Dermatology

## 2023-01-03 LAB — VAS US ABI WITH/WO TBI
Left ABI: 0.83
Right ABI: 0.88

## 2023-01-15 ENCOUNTER — Ambulatory Visit: Payer: Medicare HMO | Admitting: Dermatology

## 2023-01-15 ENCOUNTER — Encounter: Payer: Self-pay | Admitting: Dermatology

## 2023-01-15 VITALS — BP 130/66 | HR 86

## 2023-01-15 DIAGNOSIS — D099 Carcinoma in situ, unspecified: Secondary | ICD-10-CM

## 2023-01-15 DIAGNOSIS — D0471 Carcinoma in situ of skin of right lower limb, including hip: Secondary | ICD-10-CM

## 2023-01-15 DIAGNOSIS — Z5111 Encounter for antineoplastic chemotherapy: Secondary | ICD-10-CM | POA: Diagnosis not present

## 2023-01-15 NOTE — Progress Notes (Unsigned)
   Follow-Up Visit   Subjective  Misty Ferguson is a 89 y.o. female who presents for the following: recheck wound at right pretibia , husband states wound has gotten a little smaller    The following portions of the chart were reviewed this encounter and updated as appropriate: medications, allergies, medical history  Review of Systems:  No other skin or systemic complaints except as noted in HPI or Assessment and Plan.  Objective  Well appearing patient in no apparent distress; mood and affect are within normal limits.   A focused examination was performed of the following areas: Left pretibia   Relevant exam findings are noted in the Assessment and Plan.    Assessment & Plan   Squamous cell carcinoma in situ right pretibia   Bx proven SCCis   Chronic and persistent condition with duration or expected duration over one year. Condition is symptomatic/ bothersome to patient. Not currently at goal.   Debridement today over overlying crusting and scale. Cleaned with puracyn.   Continue fluorouracil 5 % cream twice a day for 3 more weeks then stop recheck in August   See previous notes. She declined Mohs, radiation. She failed topical imiquimod.   If site is thickening or growing, call and do not wait to be seen.    If not clearing with topical 5-FU, consider intralesional 5FU   Reviewed course of treatment and expected reaction.  Patient advised to expect inflammation and crusting and advised that erosions are possible.  Patient advised to be diligent with sun protection during and after treatment. Handout with details of how to apply medication and what to expect provided. Counseled to keep medication out of reach of children and pets.   Return for recheck in scc in august, needs tbse scheduled .  I, Asher Muir, CMA, am acting as scribe for Darden Dates, MD.   Documentation: I have reviewed the above documentation for accuracy and completeness, and I agree with the  above.  Darden Dates, MD

## 2023-01-15 NOTE — Patient Instructions (Addendum)
For wound    Continue fluorouracil 5 % cream for twice a day for 3 more weeks then stop recheck in August   If wound is scarring and thickening up call dont wait   Will follow up in 1 month     Due to recent changes in healthcare laws, you may see results of your pathology and/or laboratory studies on MyChart before the doctors have had a chance to review them. We understand that in some cases there may be results that are confusing or concerning to you. Please understand that not all results are received at the same time and often the doctors may need to interpret multiple results in order to provide you with the best plan of care or course of treatment. Therefore, we ask that you please give Misty Ferguson 2 business days to thoroughly review all your results before contacting the office for clarification. Should we see a critical lab result, you will be contacted sooner.   If You Need Anything After Your Visit  If you have any questions or concerns for your doctor, please call our main line at [phone] and press option 4 to reach your doctor's medical assistant. If no one answers, please leave a voicemail as directed and we will return your call as soon as possible. Messages left after 4 pm will be answered the following business day.   You may also send Misty Ferguson a message via MyChart. We typically respond to MyChart messages within 1-2 business days.  For prescription refills, please ask your pharmacy to contact our office. Our fax number is [phone].  If you have an urgent issue when the clinic is closed that cannot wait until the next business day, you can page your doctor at the number below.    Please note that while we do our best to be available for urgent issues outside of office hours, we are not available 24/7.   If you have an urgent issue and are unable to reach Misty Ferguson, you may choose to seek medical care at your doctor's office, retail clinic, urgent care center, or emergency room.  If  you have a medical emergency, please immediately call 911 or go to the emergency department.  Pager Numbers  - Dr. Gwen Pounds: [phone]  - Dr. Neale Burly: [phone]  - Dr. Roseanne Reno: [phone]  In the event of inclement weather, please call our main line at [phone] for an update on the status of any delays or closures.  Dermatology Medication Tips: Please keep the boxes that topical medications come in in order to help keep track of the instructions about where and how to use these. Pharmacies typically print the medication instructions only on the boxes and not directly on the medication tubes.   If your medication is too expensive, please contact our office at [phone] option 4 or send Misty Ferguson a message through MyChart.   We are unable to tell what your co-pay for medications will be in advance as this is different depending on your insurance coverage. However, we may be able to find a substitute medication at lower cost or fill out paperwork to get insurance to cover a needed medication.   If a prior authorization is required to get your medication covered by your insurance company, please allow Misty Ferguson 1-2 business days to complete this process.  Drug prices often vary depending on where the prescription is filled and some pharmacies may offer cheaper prices.  The website www.goodrx.com contains coupons for medications through different pharmacies. The prices here  do not account for what the cost may be with help from insurance (it may be cheaper with your insurance), but the website can give you the price if you did not use any insurance.  - You can print the associated coupon and take it with your prescription to the pharmacy.  - You may also stop by our office during regular business hours and pick up a GoodRx coupon card.  - If you need your prescription sent electronically to a different pharmacy, notify our office through [facility] or by phone at [phone] option  4.     Si Usted Necesita Algo Despus de Su Visita  Tambin puede enviarnos un mensaje a travs de Clinical cytogeneticist. Por lo general respondemos a los mensajes de MyChart en el transcurso de 1 a 2 das hbiles.  Para renovar recetas, por favor pida a su farmacia que se ponga en contacto con nuestra oficina. Annie Sable de fax es [place] [phone].  Si tiene un asunto urgente cuando la clnica est cerrada y que no puede esperar hasta el siguiente da hbil, puede llamar/localizar a su doctor(a) al nmero que aparece a continuacin.   Por favor, tenga en cuenta que aunque hacemos todo lo posible para estar disponibles para asuntos urgentes fuera del horario de [place], no estamos disponibles las 24 horas del da, los 7 [address]  [address] de la [place].   Si tiene un problema urgente y no puede comunicarse con nosotros, puede optar por buscar atencin mdica  en el consultorio de su doctor(a), en una clnica privada, en un centro de atencin urgente o en una sala de emergencias.  Si tiene Engineer, drilling, por favor llame inmediatamente al 911 o vaya a la sala de emergencias.  Nmeros de bper  - Dr. Gwen Pounds: [phone]  - Dra. Moye: [phone]  - Dra. Roseanne Reno: [phone]  En caso de inclemencias del [place], por favor llame a Lacy Duverney principal al [phone] para una actualizacin sobre el [place] de cualquier retraso o cierre.  Consejos para la medicacin en dermatologa: Por favor, guarde las cajas en las que vienen los medicamentos de uso tpico para ayudarle a seguir las instrucciones sobre dnde y cmo usarlos. Las farmacias generalmente imprimen las instrucciones del medicamento slo en las cajas y no directamente en los tubos del [place].   Si su medicamento es muy caro, por favor, pngase en contacto con Rolm Gala llamando al [phone] y presione la opcin 4 o envenos un mensaje a travs de Clinical cytogeneticist.   No podemos decirle cul ser su copago por los medicamentos por  adelantado ya que esto es diferente dependiendo de la cobertura de su seguro. Sin embargo, es posible que podamos encontrar un medicamento sustituto a Audiological scientist un formulario para que el seguro cubra el medicamento que se considera necesario.   Si se requiere una autorizacin previa para que su compaa de seguros [place] su medicamento, por favor permtanos de 1 a 2 das hbiles para completar [address]  Los precios de los medicamentos varan con frecuencia dependiendo del Environmental consultant de dnde se surte la receta y alguna farmacias pueden ofrecer precios ms baratos.  El sitio web www.goodrx.com tiene cupones para medicamentos de Health and safety inspector. Los precios aqu no tienen en cuenta lo que podra costar con la ayuda del seguro (puede ser ms barato con su seguro), pero el sitio web puede darle el precio si no utiliz Tourist information centre manager.  - Puede imprimir el cupn correspondiente y llevarlo con su receta a la farmacia.  -  Tambin puede pasar por nuestra oficina durante el horario de atencin regular y Education officer, museum una tarjeta de cupones de GoodRx.  - Si necesita que su receta se enve electrnicamente a una farmacia diferente, informe a nuestra oficina a travs de MyChart de [place] o por telfono llamando al [phone] y presione la opcin 4.

## 2023-01-16 DIAGNOSIS — H353134 Nonexudative age-related macular degeneration, bilateral, advanced atrophic with subfoveal involvement: Secondary | ICD-10-CM | POA: Diagnosis not present

## 2023-01-16 DIAGNOSIS — H353190 Nonexudative age-related macular degeneration, unspecified eye, stage unspecified: Secondary | ICD-10-CM | POA: Diagnosis not present

## 2023-01-17 ENCOUNTER — Ambulatory Visit: Payer: Medicare HMO | Admitting: Dermatology

## 2023-01-25 ENCOUNTER — Telehealth: Payer: Self-pay | Admitting: Dermatology

## 2023-01-25 NOTE — Telephone Encounter (Addendum)
Please send a certified letter. Thank you!    Dear Misty Ferguson,  I hope this letter finds you well.  It has been my sincere pleasure to be a part of your healthcare team. At the time of my leaving, our records show the skin cancer at your right shin has not yet been fully treated.   Since I will be unable to follow-up with you in future, I am writing to stress the importance of finishing your cream treatment and also keeping your follow-up appointment to be sure the skin cancer has gone away with the treatment.   When treated, skin cancer typically has very good outcomes. However, if skin cancer is left untreated it can pose serious health risks including destroying nearby tissue or even death.   If for any reason you are unable to go to your follow-up appointment or if you do not have a follow-up appointment scheduled, please reach out to our office at [phone] option 4 or through MyChart so that we can help you schedule or reschedule as needed.  If you have any concerns about your treatment and wish to discuss other treatment options, please reach out to our office as well. You can reach [place] by phone at [phone] option 4 or through [employer].   As I am transitioning from the practice, I regret that I will not be able to continue with your care personally. However, I am confident you are in capable hands with my colleagues and the team at [facility].   Thank you for entrusting me with your skin health. 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 Please do not hesitate to reach out if you have any questions or concerns.  Wishing you continued health and well-being.  Sincerely,  Misty Mealy, MD, MPH Dermatologist [facility] [phone] option 4

## 2023-01-28 ENCOUNTER — Encounter: Payer: Self-pay | Admitting: Dermatology

## 2023-02-01 ENCOUNTER — Other Ambulatory Visit: Payer: Self-pay

## 2023-02-01 ENCOUNTER — Emergency Department: Payer: Medicare HMO

## 2023-02-01 DIAGNOSIS — S51011A Laceration without foreign body of right elbow, initial encounter: Secondary | ICD-10-CM | POA: Insufficient documentation

## 2023-02-01 DIAGNOSIS — S81011A Laceration without foreign body, right knee, initial encounter: Secondary | ICD-10-CM | POA: Insufficient documentation

## 2023-02-01 DIAGNOSIS — W19XXXA Unspecified fall, initial encounter: Secondary | ICD-10-CM | POA: Diagnosis not present

## 2023-02-01 DIAGNOSIS — M25421 Effusion, right elbow: Secondary | ICD-10-CM | POA: Diagnosis not present

## 2023-02-01 DIAGNOSIS — Z7902 Long term (current) use of antithrombotics/antiplatelets: Secondary | ICD-10-CM | POA: Diagnosis not present

## 2023-02-01 DIAGNOSIS — M79605 Pain in left leg: Secondary | ICD-10-CM | POA: Diagnosis not present

## 2023-02-01 DIAGNOSIS — S7012XA Contusion of left thigh, initial encounter: Secondary | ICD-10-CM | POA: Diagnosis not present

## 2023-02-01 DIAGNOSIS — W010XXA Fall on same level from slipping, tripping and stumbling without subsequent striking against object, initial encounter: Secondary | ICD-10-CM | POA: Insufficient documentation

## 2023-02-01 DIAGNOSIS — Z043 Encounter for examination and observation following other accident: Secondary | ICD-10-CM | POA: Diagnosis not present

## 2023-02-01 NOTE — ED Triage Notes (Addendum)
Pt to ED via [facility] c/o mechanical fall about 45b mins ago. Pt fell getting of cough, no dizziness prior to/after fall. Denies hitting head, no LOC. Pt takes plavix. Pt with large skin tears to right elbow and r knee, both wrapped with gauze, bleeding under control at this time. Pt has hematoma in left thigh that's she's had for years and its feels more sensitive to pain now. Pt on 4L [place]- baseline

## 2023-02-02 ENCOUNTER — Emergency Department
Admission: EM | Admit: 2023-02-02 | Discharge: 2023-02-02 | Disposition: A | Discharge status: Home / Self Care | Payer: Medicare HMO | Attending: Emergency Medicine | Admitting: Emergency Medicine

## 2023-02-02 DIAGNOSIS — W19XXXA Unspecified fall, initial encounter: Secondary | ICD-10-CM

## 2023-02-02 DIAGNOSIS — T148XXA Other injury of unspecified body region, initial encounter: Secondary | ICD-10-CM

## 2023-02-02 NOTE — Discharge Instructions (Signed)
Please keep your wound clean by washing at least daily with soap and water. If you see any signs of infection like spreading redness, pus coming from the wound, extreme pain, fevers, chills or any other worsening doctor right away or come back to the emergency department  Use antibiotic ointments and dressings as discussed.  Have your primary doctor see you within this next week for wound check.

## 2023-02-02 NOTE — ED Provider Notes (Signed)
[facility] Provider Note    Event Date/Time   First MD Initiated Contact with Patient 02/02/23 0327     (approximate)   History   Fall   HPI  Misty Ferguson is a 89 y.o. female   Past medical history of hyperlipidemia, emphysema on home O2, peripheral vascular disease who presents emergency department with a mechanical slip and fall while she tripped over a rug and fell onto her right elbow and knee.  She is certain that he did not hit her head.  She takes antiplatelets.  Independent Historian contributed to assessment above: Her husband who is at bedside corroborates information given above       Physical Exam   Triage Vital Signs: ED Triage Vitals  Enc Vitals Group     BP 02/01/23 2129 (!) 111/57     Pulse Rate 02/01/23 2129 79     Resp 02/01/23 2129 20     Temp 02/01/23 2129 97.7 F (36.5 C)     Temp Source 02/01/23 2129 Oral     SpO2 02/01/23 2129 94 %     Weight 02/01/23 2130 92 lb (41.7 kg)     Height 02/01/23 2130 5\' 1"  (1.549 m)     Head Circumference --      Peak Flow --      Pain Score 02/01/23 2130 1     Pain Loc --      Pain Edu? --      Excl. in GC? --     Most recent vital signs: Vitals:   02/01/23 2129 02/02/23 0330  BP: (!) 111/57 (!) 154/69  Pulse: 79 80  Resp: 20 18  Temp: 97.7 F (36.5 C) 97.6 F (36.4 C)  SpO2: 94% 92%    General: Awake, no distress.  CV:  Good peripheral perfusion.  Resp:  Normal effort.  Abd:  No distention.  Other:  Skin tear to the right elbow and skin tear to the right knee.  No bony tenderness full active range of motion neurovascular intact.  No signs of head trauma.   ED Results / Procedures / Treatments   Labs (all labs ordered are listed, but only abnormal results are displayed) Labs Reviewed - No data to display     RADIOLOGY I independently reviewed and interpreted elbow x-ray and I see no obvious fracture or dislocation   PROCEDURES:  Critical Care performed:  No  ..Laceration Repair  Date/Time: 02/02/2023 5:14 AM  Performed by: Pilar Jarvis, MD Authorized by: Pilar Jarvis, MD   Consent:    Consent obtained:  Verbal   Consent given by:  Patient   Risks, benefits, and alternatives were discussed: yes     Risks discussed:  Infection, pain and poor cosmetic result Anesthesia:    Anesthesia method:  None Laceration details:    Location:  Shoulder/arm   Shoulder/arm location:  R elbow   Length (cm):  3   Depth (mm):  2 Exploration:    Wound extent: no signs of injury   Treatment:    Debridement:  None Skin repair:    Repair method:  Steri-Strips Approximation:    Approximation:  Close Repair type:    Repair type:  Simple Post-procedure details:    Dressing:  Antibiotic ointment and non-adherent dressing   Procedure completion:  Tolerated    MEDICATIONS ORDERED IN ED: Medications - No data to display  IMPRESSION / MDM / ASSESSMENT AND PLAN / ED COURSE  I reviewed  the triage vital signs and the nursing notes.                                Patient's presentation is most consistent with acute presentation with potential threat to life or bodily function.  Differential diagnosis includes, but is not limited to, blunt traumatic injury including fractures, dislocations, intracranial bleeding   The patient is on the cardiac monitor to evaluate for evidence of arrhythmia and/or significant heart rate changes.  MDM: Mechanical slip and fall with elbow and knee injury to the right side with skin tears associated and no fracture or dislocation on x-ray imaging neurovascular intact.  Devitalized skin on the right knee was removed and Steri-Strips applied to the right elbow skin tear and dressed.  Doubt medical reason for her very clearly mechanical slip and fall over a rug.  Anticipatory guidance given, PMD follow-up for wound care, discharge.         FINAL CLINICAL IMPRESSION(S) / ED DIAGNOSES   Final diagnoses:  Fall, initial  encounter  Multiple skin tears     Rx / DC Orders   ED Discharge Orders     None        Note:  This document was prepared using Dragon voice recognition software and may include unintentional dictation errors.    Pilar Jarvis, MD 02/02/23 [phone]

## 2023-02-08 ENCOUNTER — Encounter: Payer: Self-pay | Admitting: Family Medicine

## 2023-02-08 ENCOUNTER — Telehealth: Payer: Self-pay

## 2023-02-08 ENCOUNTER — Ambulatory Visit (INDEPENDENT_AMBULATORY_CARE_PROVIDER_SITE_OTHER): Payer: Medicare HMO | Admitting: Family Medicine

## 2023-02-08 VITALS — BP 120/60 | HR 98 | Temp 98.2°F | Ht 61.0 in | Wt 92.0 lb

## 2023-02-08 DIAGNOSIS — T148XXA Other injury of unspecified body region, initial encounter: Secondary | ICD-10-CM

## 2023-02-08 MED ORDER — SILVER SULFADIAZINE 1 % EX CREA: 1.0000 | TOPICAL_CREAM | Freq: Every day | CUTANEOUS | 0 refills | Status: DC

## 2023-02-08 NOTE — Progress Notes (Signed)
Patient ID: Misty Ferguson, female    DOB: 07/17/1934, 89 y.o.   MRN: [id number]  This visit was conducted in person.  BP 120/60 (BP Location: Left Arm, Patient Position: Sitting, Cuff Size: Normal)   Pulse 98   Temp 98.2 F (36.8 C) (Temporal)   Ht 5\' 1"  (1.549 m)   Wt 92 lb (41.7 kg) Comment: Patient reported  SpO2 91%   BMI 17.38 kg/m    CC:  Chief Complaint  Patient presents with   Hospitalization Follow-up    [facility] ED 02/02/23    Subjective:   HPI: Misty Ferguson is a 89 y.o. female history of severe COPD on home oxygen.  Is presenting on 02/08/2023 for Hospitalization Follow-up [facility] ED 02/02/23) Reviewed emergency room note following mechanical slip and fall tripping on a rug on February 02, 2023. Landed on her right elbow and knee.  No head injuries.     X-ray of right elbow and knee show no fractures  Steri-Strips applied to the right elbow skin tear.  The tissue debrided from knee.  Antibiotic ointment and nonadherent dressing applied.   Today she reports  Relevant past medical, surgical, family and social history reviewed and updated as indicated. Interim medical history since our last visit reviewed. Allergies and medications reviewed and updated. Outpatient Medications Prior to Visit  Medication Sig Dispense Refill   albuterol (VENTOLIN HFA) 108 (90 Base) MCG/ACT inhaler Inhale 2 puffs into the lungs every 6 (six) hours as needed for wheezing or shortness of breath. 8 g 0   ascorbic acid (VITAMIN C) 1000 MG tablet Take 1,000 mg by mouth daily.      atorvastatin (LIPITOR) 10 MG tablet TAKE 1 TABLET EVERY DAY 90 tablet 3   azelastine (ASTELIN) 0.1 % nasal spray Place 2 sprays into both nostrils 2 (two) times daily. Use in each nostril as directed 30 mL 5   budesonide-formoterol (SYMBICORT) 160-4.5 MCG/ACT inhaler Inhale 2 puffs into the lungs in the morning and at bedtime. 3 each 0   Cholecalciferol (VITAMIN D) 125 MCG (5000 UT) CAPS Take 5,000 Units by mouth daily.      clopidogrel (PLAVIX) 75 MG tablet TAKE 1 TABLET EVERY DAY 90 tablet 3   docusate sodium (COLACE) 100 MG capsule Take 100 mg by mouth 2 (two) times daily.     fluorouracil (EFUDEX) 5 % cream Apply topically to aa of right leg twice daily for 4 weeks 40 g 0   losartan (COZAAR) 25 MG tablet 25 mg daily.      Multiple Vitamins-Minerals (PRESERVISION AREDS) TABS Take 1 tablet by mouth 2 (two) times daily.     Omega-3 Fatty Acids (FISH OIL) 1000 MG CAPS Take 1,000 mg by mouth 2 (two) times daily.     OXYGEN Inhale 4 L into the lungs as needed.     verapamil (VERELAN PM) 360 MG 24 hr capsule Take 360 mg by mouth daily.     Vitamin E 180 MG CAPS Take 180 mg by mouth daily.     No facility-administered medications prior to visit.     Per HPI unless specifically indicated in ROS section below Review of Systems  Constitutional:  Negative for fatigue and fever.  HENT:  Negative for ear pain.   Eyes:  Negative for pain.  Respiratory:  Negative for chest tightness and shortness of breath.        Some recent increase in mucus production remains clear, no change in shortness of breath  or oxygen requirement  Cardiovascular:  Negative for chest pain, palpitations and leg swelling.  Gastrointestinal:  Negative for abdominal pain.  Genitourinary:  Negative for dysuria.   Objective:  BP 120/60 (BP Location: Left Arm, Patient Position: Sitting, Cuff Size: Normal)   Pulse 98   Temp 98.2 F (36.8 C) (Temporal)   Ht 5\' 1"  (1.549 m)   Wt 92 lb (41.7 kg) Comment: Patient reported  SpO2 91%   BMI 17.38 kg/m   Wt Readings from Last 3 Encounters:  02/08/23 92 lb (41.7 kg)  02/01/23 92 lb (41.7 kg)  11/20/22 91 lb (41.3 kg)      Physical Exam Constitutional:      General: She is not in acute distress.    Appearance: Normal appearance. She is well-developed. She is not ill-appearing or toxic-appearing.  HENT:     Head: Normocephalic.     Right Ear: Hearing, tympanic membrane, ear canal and  external ear normal. Tympanic membrane is not erythematous, retracted or bulging.     Left Ear: Hearing, tympanic membrane, ear canal and external ear normal. Tympanic membrane is not erythematous, retracted or bulging.     Nose: No mucosal edema or rhinorrhea.     Right Sinus: No maxillary sinus tenderness or frontal sinus tenderness.     Left Sinus: No maxillary sinus tenderness or frontal sinus tenderness.     Mouth/Throat:     Pharynx: Uvula midline.  Eyes:     General: Lids are normal. Lids are everted, no foreign bodies appreciated.     Conjunctiva/sclera: Conjunctivae normal.     Pupils: Pupils are equal, round, and reactive to light.  Neck:     Thyroid: No thyroid mass or thyromegaly.     Vascular: No carotid bruit.     Trachea: Trachea normal.  Cardiovascular:     Rate and Rhythm: Normal rate and regular rhythm.     Pulses: Normal pulses.     Heart sounds: Normal heart sounds, S1 normal and S2 normal. No murmur heard.    No friction rub. No gallop.  Pulmonary:     Effort: Pulmonary effort is normal. No tachypnea or respiratory distress.     Breath sounds: Normal breath sounds. Decreased air movement present. No decreased breath sounds, wheezing, rhonchi or rales.     Comments: Tympanic sounding lungs at patient's baseline Abdominal:     General: Bowel sounds are normal.     Palpations: Abdomen is soft.     Tenderness: There is no abdominal tenderness.  Musculoskeletal:     Cervical back: Normal range of motion and neck supple.  Skin:    General: Skin is warm and dry.     Findings: Wound present.     Comments: See photos  Neurological:     Mental Status: She is alert.  Psychiatric:        Mood and Affect: Mood is not anxious or depressed.        Speech: Speech normal.        Behavior: Behavior normal. Behavior is cooperative.        Thought Content: Thought content normal.        Judgment: Judgment normal.          Results for orders placed or performed in  visit on 01/01/23  VAS [place] ABI WITH/WO TBI  Result Value Ref Range   Right ABI 0.88    Left ABI 0.83     Assessment and Plan  Multiple skin tears Assessment &  Plan: Acute  Skin tear on right elbow well-approximated and healing well.  No associated infection  Skin tear on right knee healing well with granulation tissue and no sign of infection.  Discussed changing dressing plan to include silver sulfasalazine daily with dressing changes.  Daily washing with warm soapy water.  Elevate legs as able to prevent swelling.  Follow-up in 1 week for reevaluation.  Return and ER precautions provided   Other orders -     Silver sulfADIAZINE; Apply 1 Application topically daily.  Dispense: 400 g; Refill: 0    Return in about 1 week (around 02/15/2023) for  wound cheek.   Kerby Nora, MD

## 2023-02-08 NOTE — Assessment & Plan Note (Signed)
Acute  Skin tear on right elbow well-approximated and healing well.  No associated infection  Skin tear on right knee healing well with granulation tissue and no sign of infection.  Discussed changing dressing plan to include silver sulfasalazine daily with dressing changes.  Daily washing with warm soapy water.  Elevate legs as able to prevent swelling.  Follow-up in 1 week for reevaluation.  Return and ER precautions provided

## 2023-02-08 NOTE — Telephone Encounter (Signed)
  Transition Care Management Follow-up Telephone Call Date of discharge and from where: [facility] 6/22 How have you been since you were released from the hospital? Doing ok Any questions or concerns? No  Items Reviewed: Did the pt receive and understand the discharge instructions provided? Yes  Medications obtained and verified? No  Other? No  Any new allergies since your discharge? No  Dietary orders reviewed? No Do you have support at home? Yes    Follow up appointments reviewed:  PCP Hospital f/u appt confirmed? Yes  Scheduled to see PCP on 6/28 @ . Specialist Hospital f/u appt confirmed? No  Scheduled to see  on  @ . Are transportation arrangements needed? No  If their condition worsens, is the pt aware to call PCP or go to the Emergency Dept.? Yes Was the patient provided with contact information for the PCP's office or ED? Yes Was to pt encouraged to call back with questions or concerns? Yes

## 2023-02-19 ENCOUNTER — Ambulatory Visit (INDEPENDENT_AMBULATORY_CARE_PROVIDER_SITE_OTHER): Payer: Medicare HMO | Admitting: Family Medicine

## 2023-02-19 ENCOUNTER — Encounter: Payer: Self-pay | Admitting: Family Medicine

## 2023-02-19 VITALS — BP 140/70 | HR 90 | Temp 98.2°F | Ht 61.0 in | Wt 93.0 lb

## 2023-02-19 DIAGNOSIS — S81812D Laceration without foreign body, left lower leg, subsequent encounter: Secondary | ICD-10-CM

## 2023-02-19 DIAGNOSIS — S81812A Laceration without foreign body, left lower leg, initial encounter: Secondary | ICD-10-CM | POA: Insufficient documentation

## 2023-02-19 NOTE — Progress Notes (Signed)
Patient ID: Misty Ferguson, female    DOB: Aug 19, 1933, 89 y.o.   MRN: [id number]  This visit was conducted in person.  BP (!) 140/70 (BP Location: Left Arm, Patient Position: Sitting, Cuff Size: Normal)   Pulse 90   Temp 98.2 F (36.8 C) (Temporal)   Ht 5\' 1"  (1.549 m)   Wt 93 lb (42.2 kg)   SpO2 94% Comment: 3 L 02 Intermittent  BMI 17.57 kg/m    CC:  Chief Complaint  Patient presents with   Wound Check    Subjective:   HPI: Misty Ferguson is a 89 y.o. female presenting on 02/19/2023 for Wound Check  Sh presents for follow up on skin tear to right anterior knee... she has been applying silver sulfasalazine  No redness spreading, she does have an area lower down on the same leg where she is treating with chemotherapy ointment for squamous cell carcinoma.  This area is sore  Skin tear is not uncomfortable. She has been leaving it uncovered here recently in the last couple of days  She has noted mild swelling in her right calf.  No fever, no flulike symptoms        Relevant past medical, surgical, family and social history reviewed and updated as indicated. Interim medical history since our last visit reviewed. Allergies and medications reviewed and updated. Outpatient Medications Prior to Visit  Medication Sig Dispense Refill   albuterol (VENTOLIN HFA) 108 (90 Base) MCG/ACT inhaler Inhale 2 puffs into the lungs every 6 (six) hours as needed for wheezing or shortness of breath. 8 g 0   ascorbic acid (VITAMIN C) 1000 MG tablet Take 1,000 mg by mouth daily.      atorvastatin (LIPITOR) 10 MG tablet TAKE 1 TABLET EVERY DAY 90 tablet 3   azelastine (ASTELIN) 0.1 % nasal spray Place 2 sprays into both nostrils 2 (two) times daily. Use in each nostril as directed 30 mL 5   budesonide-formoterol (SYMBICORT) 160-4.5 MCG/ACT inhaler Inhale 2 puffs into the lungs in the morning and at bedtime. 3 each 0   Cholecalciferol (VITAMIN D) 125 MCG (5000 UT) CAPS Take 5,000 Units by mouth  daily.     clopidogrel (PLAVIX) 75 MG tablet TAKE 1 TABLET EVERY DAY 90 tablet 3   docusate sodium (COLACE) 100 MG capsule Take 100 mg by mouth 2 (two) times daily.     fluorouracil (EFUDEX) 5 % cream Apply topically to aa of right leg twice daily for 4 weeks 40 g 0   losartan (COZAAR) 25 MG tablet 25 mg daily.      Multiple Vitamins-Minerals (PRESERVISION AREDS) TABS Take 1 tablet by mouth 2 (two) times daily.     Omega-3 Fatty Acids (FISH OIL) 1000 MG CAPS Take 1,000 mg by mouth 2 (two) times daily.     OXYGEN Inhale 4 L into the lungs as needed.     silver sulfADIAZINE (SILVADENE) 1 % cream Apply 1 Application topically daily. 400 g 0   verapamil (VERELAN PM) 360 MG 24 hr capsule Take 360 mg by mouth daily.     Vitamin E 180 MG CAPS Take 180 mg by mouth daily.     No facility-administered medications prior to visit.     Per HPI unless specifically indicated in ROS section below Review of Systems  Constitutional:  Negative for chills and fever.  HENT:  Negative for congestion and ear pain.   Eyes:  Negative for pain and redness.  Respiratory:  Negative for cough and shortness of breath.   Cardiovascular:  Negative for chest pain, palpitations and leg swelling.  Gastrointestinal:  Negative for abdominal pain, blood in stool, constipation, diarrhea, nausea and vomiting.  Genitourinary:  Negative for dysuria.  Musculoskeletal:  Negative for myalgias.  Skin:  Negative for rash.  Neurological:  Negative for dizziness.  Psychiatric/Behavioral:  The patient is not nervous/anxious.    Objective:  BP (!) 140/70 (BP Location: Left Arm, Patient Position: Sitting, Cuff Size: Normal)   Pulse 90   Temp 98.2 F (36.8 C) (Temporal)   Ht 5\' 1"  (1.549 m)   Wt 93 lb (42.2 kg)   SpO2 94% Comment: 3 L 02 Intermittent  BMI 17.57 kg/m   Wt Readings from Last 3 Encounters:  02/19/23 93 lb (42.2 kg)  02/08/23 92 lb (41.7 kg)  02/01/23 92 lb (41.7 kg)      Physical Exam Cardiovascular:      Rate and Rhythm: Normal rate.     Pulses: No decreased pulses.  Skin:    Findings: Signs of injury present.  Neurological:     Mental Status: She is alert.       Results for orders placed or performed in visit on 01/01/23  VAS [place] ABI WITH/WO TBI  Result Value Ref Range   Right ABI 0.88    Left ABI 0.83     Assessment and Plan  Skin tear of left lower leg without complication, subsequent encounter Assessment & Plan: Acute, area healing well without associated infection.  Wash with warm soapy water daily, follow with topical antibiotic ointment, can leave uncovered. Elevate leg and move legs is much as able.  Return and ER precautions provided     No follow-ups on file.   Kerby Nora, MD

## 2023-02-19 NOTE — Assessment & Plan Note (Signed)
Acute, area healing well without associated infection.  Wash with warm soapy water daily, follow with topical antibiotic ointment, can leave uncovered. Elevate leg and move legs is much as able.  Return and ER precautions provided

## 2023-04-16 ENCOUNTER — Ambulatory Visit (INDEPENDENT_AMBULATORY_CARE_PROVIDER_SITE_OTHER): Payer: Medicare HMO | Admitting: Dermatology

## 2023-04-16 ENCOUNTER — Encounter: Payer: Self-pay | Admitting: Dermatology

## 2023-04-16 DIAGNOSIS — Z85828 Personal history of other malignant neoplasm of skin: Secondary | ICD-10-CM | POA: Diagnosis not present

## 2023-04-16 DIAGNOSIS — D229 Melanocytic nevi, unspecified: Secondary | ICD-10-CM

## 2023-04-16 DIAGNOSIS — Z1283 Encounter for screening for malignant neoplasm of skin: Secondary | ICD-10-CM

## 2023-04-16 DIAGNOSIS — L570 Actinic keratosis: Secondary | ICD-10-CM | POA: Diagnosis not present

## 2023-04-16 DIAGNOSIS — L814 Other melanin hyperpigmentation: Secondary | ICD-10-CM

## 2023-04-16 DIAGNOSIS — D485 Neoplasm of uncertain behavior of skin: Secondary | ICD-10-CM | POA: Diagnosis not present

## 2023-04-16 DIAGNOSIS — L578 Other skin changes due to chronic exposure to nonionizing radiation: Secondary | ICD-10-CM | POA: Diagnosis not present

## 2023-04-16 DIAGNOSIS — L821 Other seborrheic keratosis: Secondary | ICD-10-CM

## 2023-04-16 DIAGNOSIS — D1801 Hemangioma of skin and subcutaneous tissue: Secondary | ICD-10-CM

## 2023-04-16 NOTE — Patient Instructions (Signed)

## 2023-04-16 NOTE — Progress Notes (Signed)
Follow-Up Visit   Subjective  Misty Ferguson is a 89 y.o. female who presents for the following: Skin Cancer Screening and Full Body Skin Exam, hx of SCC, hx of precancers.   The patient presents for Total-Body Skin Exam (TBSE) for skin cancer screening and mole check. The patient has spots, moles and lesions to be evaluated, some may be new or changing and the patient may have concern these could be cancer.  Husband  is with patient and contributes to history.    The following portions of the chart were reviewed this encounter and updated as appropriate: medications, allergies, medical history  Review of Systems:  No other skin or systemic complaints except as noted in HPI or Assessment and Plan.  Objective  Well appearing patient in no apparent distress; mood and affect are within normal limits.  A full examination was performed including scalp, head, eyes, ears, nose, lips, neck, chest, axillae, abdomen, back, buttocks, bilateral upper extremities, bilateral lower extremities, hands, feet, fingers, toes, fingernails, and toenails. All findings within normal limits unless otherwise noted below.   Relevant physical exam findings are noted in the Assessment and Plan.           right nasal dorsum, left preauricular, right forearm, right upper arm Erythematous thin papules/macules with gritty scale.     Assessment & Plan   SKIN CANCER SCREENING PERFORMED TODAY.  ACTINIC DAMAGE - Chronic condition, secondary to cumulative UV/sun exposure - diffuse scaly erythematous macules with underlying dyspigmentation - Recommend daily broad spectrum sunscreen SPF 30+ to sun-exposed areas, reapply every 2 hours as needed.  - Staying in the shade or wearing long sleeves, sun glasses (UVA+UVB protection) and wide brim hats (4-inch brim around the entire circumference of the hat) are also recommended for sun protection.  - Call for new or changing lesions.  LENTIGINES, SEBORRHEIC  KERATOSES, HEMANGIOMAS - Benign normal skin lesions - Benign-appearing - Call for any changes  MELANOCYTIC NEVI - Tan-brown and/or pink-flesh-colored symmetric macules and papules - Benign appearing on exam today - Observation - Call clinic for new or changing moles - Recommend daily use of broad spectrum spf 30+ sunscreen to sun-exposed areas.    HISTORY OF SQUAMOUS CELL CARCINOMA OF THE SKIN - No evidence of recurrence today - No lymphadenopathy - Recommend regular full body skin exams - Recommend daily broad spectrum sunscreen SPF 30+ to sun-exposed areas, reapply every 2 hours as needed.  - Call if any new or changing lesions are noted between office visits   Squamous cell carcinoma in situ, partially improved but residual is concerning for invasive SCC/keratoacanthoma right pretibial 12 x 13 mm Indurated pink plaque with central crusting  Photo taken    Bx proven SCCis. Concerning for invasive SCC given exam. Discussed risk of metastasis is low but possible. Mets could harm overall health. Topical therapy will not be effective. Discussed options of observation for progression/new symptoms vs biopsy, EDC, IL-5FU. Patient opts for observation   Chronic and persistent condition with duration or expected duration over one year. Condition is symptomatic/ bothersome to patient. Not currently at goal.    See previous notes. She declined Mohs, radiation and Fluorouracil  injections. She failed topical imiquimod and Fluorouracil/Calcipotriene cream.   Patient would like to observe for changes   Lesion concerning for SCC 19 x 20 mm pink plaque with central erosion at the left lower lateral leg  See photo  Concerning for invasive SCC given exam. Discussed risk of metastasis is low  but possible. Mets could harm overall health. Topical therapy will not be effective. Discussed options of observation for progression/new symptoms vs biopsy, EDC, IL-5FU. Patient opts for observation   AK  (actinic keratosis) right nasal dorsum, left preauricular, right forearm, right upper arm  Actinic keratoses are precancerous spots that appear secondary to cumulative UV radiation exposure/sun exposure over time. They are chronic with expected duration over 1 year. A portion of actinic keratoses will progress to squamous cell carcinoma of the skin. It is not possible to reliably predict which spots will progress to skin cancer and so treatment is recommended to prevent development of skin cancer.  Recommend daily broad spectrum sunscreen SPF 30+ to sun-exposed areas, reapply every 2 hours as needed.  Recommend staying in the shade or wearing long sleeves, sun glasses (UVA+UVB protection) and wide brim hats (4-inch brim around the entire circumference of the hat). Call for new or changing lesions.   No treatment today, we will observe   Multiple benign nevi  Lentigines  Seborrheic keratoses  Actinic elastosis  Cherry angioma  Neoplasm of uncertain behavior of skin   Return in about 6 months (around 10/14/2023) for recheck legs, SCCis .  IAngelique Holm, CMA, am acting as scribe for Elie Goody, MD .   Documentation: I have reviewed the above documentation for accuracy and completeness, and I agree with the above.  Elie Goody, MD

## 2023-04-17 ENCOUNTER — Encounter: Payer: Self-pay | Admitting: Family Medicine

## 2023-04-17 ENCOUNTER — Ambulatory Visit (INDEPENDENT_AMBULATORY_CARE_PROVIDER_SITE_OTHER): Payer: Medicare HMO | Admitting: Family Medicine

## 2023-04-17 VITALS — BP 140/60 | HR 79 | Temp 99.1°F | Ht 61.0 in | Wt 90.0 lb

## 2023-04-17 DIAGNOSIS — J432 Centrilobular emphysema: Secondary | ICD-10-CM | POA: Diagnosis not present

## 2023-04-17 DIAGNOSIS — J9611 Chronic respiratory failure with hypoxia: Secondary | ICD-10-CM

## 2023-04-17 DIAGNOSIS — T17908A Unspecified foreign body in respiratory tract, part unspecified causing other injury, initial encounter: Secondary | ICD-10-CM | POA: Diagnosis not present

## 2023-04-17 DIAGNOSIS — R051 Acute cough: Secondary | ICD-10-CM

## 2023-04-17 MED ORDER — PROBIOTIC (LACTOBACILLUS) PO CAPS: ORAL_CAPSULE | ORAL | 0 refills | Status: DC

## 2023-04-17 MED ORDER — AMOXICILLIN-POT CLAVULANATE 875-125 MG PO TABS: 1.0000 | ORAL_TABLET | Freq: Two times a day (BID) | ORAL | 0 refills | Status: DC

## 2023-04-17 NOTE — Progress Notes (Signed)
Patient ID: Misty Ferguson, female    DOB: 1933-11-25, 89 y.o.   MRN: [id number]  This visit was conducted in person.  BP (!) 140/60 (BP Location: Left Arm, Patient Position: Sitting, Cuff Size: Normal)   Pulse 79   Temp 99.1 F (37.3 C) (Temporal)   Ht 5\' 1"  (1.549 m)   Wt 90 lb (40.8 kg) Comment: patient reported  SpO2 94%   BMI 17.01 kg/m    CC:  Chief Complaint  Patient presents with   Cough    X 1 month    Subjective:   HPI: Misty Ferguson is a 89 y.o. female presenting on 04/17/2023 for Cough (X 1 month)   Date of onset:  1 month Initial symptoms included  chest congestion.. started after aspirating something at dinner time Symptoms progressed to gradually increasing  chest congestion, chest rattle and productive cough  Some trouble coughing it up.   Some worsening of SOB, but no increase in oxygen requirement.   No fever.  No body aches.    Sick contacts:  none COVID testing:   none    She has tried to treat with  mucinex and tylneol.   Using Symbicort BID and albuterol prn ( using weekly)  Former smoker History of severe COPD on oxygen.        Relevant past medical, surgical, family and social history reviewed and updated as indicated. Interim medical history since our last visit reviewed. Allergies and medications reviewed and updated. Outpatient Medications Prior to Visit  Medication Sig Dispense Refill   albuterol (VENTOLIN HFA) 108 (90 Base) MCG/ACT inhaler Inhale 2 puffs into the lungs every 6 (six) hours as needed for wheezing or shortness of breath. 8 g 0   ascorbic acid (VITAMIN C) 1000 MG tablet Take 1,000 mg by mouth daily.      atorvastatin (LIPITOR) 10 MG tablet TAKE 1 TABLET EVERY DAY 90 tablet 3   azelastine (ASTELIN) 0.1 % nasal spray Place 2 sprays into both nostrils 2 (two) times daily. Use in each nostril as directed 30 mL 5   budesonide-formoterol (SYMBICORT) 160-4.5 MCG/ACT inhaler Inhale 2 puffs into the lungs in the morning and  at bedtime. 3 each 0   Cholecalciferol (VITAMIN D) 125 MCG (5000 UT) CAPS Take 5,000 Units by mouth daily.     clopidogrel (PLAVIX) 75 MG tablet TAKE 1 TABLET EVERY DAY 90 tablet 3   docusate sodium (COLACE) 100 MG capsule Take 100 mg by mouth 2 (two) times daily.     losartan (COZAAR) 25 MG tablet 25 mg daily.      Multiple Vitamins-Minerals (PRESERVISION AREDS) TABS Take 1 tablet by mouth 2 (two) times daily.     OXYGEN Inhale 4 L into the lungs as needed.     verapamil (VERELAN PM) 360 MG 24 hr capsule Take 360 mg by mouth daily.     Vitamin E 180 MG CAPS Take 180 mg by mouth daily.     fluorouracil (EFUDEX) 5 % cream Apply topically to aa of right leg twice daily for 4 weeks 40 g 0   Omega-3 Fatty Acids (FISH OIL) 1000 MG CAPS Take 1,000 mg by mouth 2 (two) times daily.     silver sulfADIAZINE (SILVADENE) 1 % cream Apply 1 Application topically daily. 400 g 0   No facility-administered medications prior to visit.     Per HPI unless specifically indicated in ROS section below Review of Systems  Constitutional:  Negative for  fatigue and fever.  HENT:  Positive for congestion.   Eyes:  Negative for pain.  Respiratory:  Positive for cough, shortness of breath and wheezing. Negative for choking and chest tightness.   Cardiovascular:  Negative for chest pain, palpitations and leg swelling.  Gastrointestinal:  Negative for abdominal pain.  Genitourinary:  Negative for dysuria and vaginal bleeding.  Musculoskeletal:  Negative for back pain.  Neurological:  Negative for syncope, light-headedness and headaches.  Psychiatric/Behavioral:  Negative for dysphoric mood.    Objective:  BP (!) 140/60 (BP Location: Left Arm, Patient Position: Sitting, Cuff Size: Normal)   Pulse 79   Temp 99.1 F (37.3 C) (Temporal)   Ht 5\' 1"  (1.549 m)   Wt 90 lb (40.8 kg) Comment: patient reported  SpO2 94%   BMI 17.01 kg/m   Wt Readings from Last 3 Encounters:  04/17/23 90 lb (40.8 kg)  02/19/23 93 lb  (42.2 kg)  02/08/23 92 lb (41.7 kg)      Physical Exam Constitutional:      General: She is not in acute distress.    Appearance: Normal appearance. She is well-developed. She is not ill-appearing or toxic-appearing.  HENT:     Head: Normocephalic.     Right Ear: Hearing, tympanic membrane, ear canal and external ear normal. Tympanic membrane is not erythematous, retracted or bulging.     Left Ear: Hearing, tympanic membrane, ear canal and external ear normal. Tympanic membrane is not erythematous, retracted or bulging.     Nose: No mucosal edema or rhinorrhea.     Right Sinus: No maxillary sinus tenderness or frontal sinus tenderness.     Left Sinus: No maxillary sinus tenderness or frontal sinus tenderness.     Mouth/Throat:     Mouth: Oropharynx is clear and moist and mucous membranes are normal.     Pharynx: Uvula midline.  Eyes:     General: Lids are normal. Lids are everted, no foreign bodies appreciated.     Extraocular Movements: EOM normal.     Conjunctiva/sclera: Conjunctivae normal.     Pupils: Pupils are equal, round, and reactive to light.  Neck:     Thyroid: No thyroid mass or thyromegaly.     Vascular: No carotid bruit.     Trachea: Trachea normal.  Cardiovascular:     Rate and Rhythm: Normal rate and regular rhythm.     Pulses: Normal pulses.     Heart sounds: Normal heart sounds, S1 normal and S2 normal. No murmur heard.    No friction rub. No gallop.  Pulmonary:     Effort: Pulmonary effort is normal. No tachypnea or respiratory distress.     Breath sounds: Decreased breath sounds present. No wheezing, rhonchi or rales.     Comments:  No focal changes, stable at baseline Abdominal:     General: Bowel sounds are normal.     Palpations: Abdomen is soft.     Tenderness: There is no abdominal tenderness.  Musculoskeletal:     Cervical back: Normal range of motion and neck supple.  Skin:    General: Skin is warm, dry and intact.     Findings: No rash.   Neurological:     Mental Status: She is alert.  Psychiatric:        Mood and Affect: Mood is not anxious or depressed.        Speech: Speech normal.        Behavior: Behavior normal. Behavior is cooperative.  Thought Content: Thought content normal.        Cognition and Memory: Cognition and memory normal.        Judgment: Judgment normal.       Results for orders placed or performed in visit on 01/01/23  VAS [place] ABI WITH/WO TBI  Result Value Ref Range   Right ABI 0.88    Left ABI 0.83     Assessment and Plan  Aspiration into airway, initial encounter  Acute cough Assessment & Plan: Acute, change in cough in patient with emphysema/COPD.  Witnessed aspiration event. No fever or flulike symptoms.  Nontoxic in appearance.  Will treat with Augmentin to cover for possible aspiration versus bronchitis/pneumonia. If not improving as expected after several days of antibiotics will consider moving forward with chest x-ray. Patient will continue Symbicort twice daily and use albuterol as needed.  No current clear need for prednisone taper as wheezing at baseline, only using albuterol once a week. No change in oxygen requirement   Chronic hypoxemic respiratory failure (HCC)  Centrilobular emphysema (HCC)  Other orders -     Amoxicillin-Pot Clavulanate; Take 1 tablet by mouth 2 (two) times daily.  Dispense: 20 tablet; Refill: 0 -     Probiotic (Lactobacillus); Can consider probiotic daily while on antibiotics to prevent diarrhea side effect.  Dispense: 30 capsule; Refill: 0    No follow-ups on file.   Kerby Nora, MD

## 2023-04-17 NOTE — Assessment & Plan Note (Signed)
Acute, change in cough in patient with emphysema/COPD.  Witnessed aspiration event. No fever or flulike symptoms.  Nontoxic in appearance.  Will treat with Augmentin to cover for possible aspiration versus bronchitis/pneumonia. If not improving as expected after several days of antibiotics will consider moving forward with chest x-ray. Patient will continue Symbicort twice daily and use albuterol as needed.  No current clear need for prednisone taper as wheezing at baseline, only using albuterol once a week. No change in oxygen requirement

## 2023-04-22 ENCOUNTER — Encounter: Payer: Self-pay | Admitting: Family Medicine

## 2023-04-22 NOTE — Telephone Encounter (Signed)
Melissa RN with Access nurse called and said disposition was for pt to be seen within 4 hours. no available appt at [facility] or other [facility] offices and pt was advised by access to go to [facility]. Melissa said pt just wanted answer to question. Since loss of vision Melissa RN will advise Dr Ermalene Searing is out of office and pt should go to UC if vision changes.Misty Kaufmann RN with access said she will let pt know. Will attach access nurse note when available.  I spoke with pts husband and they have decided to stop evening dose and any further abx until  hears from dr Ermalene Searing; pts husband said Lupita Leash CMA will make sue Dr Ermalene Searing sees note in AM and pt will get cb. Pts husband said pt has macular degeneration and is blind in one eye and the other eye vision is deteriorating. Since taking abx pt has distorted or blurred vision. UC &ED precautions given again and pts husband voiced understanding. Sending note to Dr Ermalene Searing who is out of office, [facility] pool and Dr Alphonsus Sias who is still in office as FYI.

## 2023-04-22 NOTE — Telephone Encounter (Signed)
FYI: This call has been transferred to Access Nurse. Once the result note has been entered staff can address the message at that time.  Patient called in with the following symptoms:  Red Word: loss of vision    Please advise at Mobile [phone] (mobile)  Message is routed to Provider Pool and [facility] Triage   Pt called stating she believes she is having some side effects from the meds, amoxicillin 875-125mg . Pt states she is having loss of vision, she believes her vision isn't the same & she also mentioned having diarrhea. Transferred to access nurse.

## 2023-04-23 ENCOUNTER — Other Ambulatory Visit: Payer: Self-pay | Admitting: Family Medicine

## 2023-04-23 MED ORDER — AZITHROMYCIN 250 MG PO TABS: ORAL_TABLET | ORAL | 0 refills | Status: DC

## 2023-04-29 ENCOUNTER — Encounter: Payer: Self-pay | Admitting: Family Medicine

## 2023-04-29 ENCOUNTER — Encounter (INDEPENDENT_AMBULATORY_CARE_PROVIDER_SITE_OTHER): Payer: Self-pay

## 2023-05-01 ENCOUNTER — Ambulatory Visit (INDEPENDENT_AMBULATORY_CARE_PROVIDER_SITE_OTHER): Payer: Medicare HMO

## 2023-05-01 ENCOUNTER — Other Ambulatory Visit (INDEPENDENT_AMBULATORY_CARE_PROVIDER_SITE_OTHER): Payer: Self-pay | Admitting: Vascular Surgery

## 2023-05-01 ENCOUNTER — Encounter: Payer: Self-pay | Admitting: Family Medicine

## 2023-05-01 DIAGNOSIS — Z0100 Encounter for examination of eyes and vision without abnormal findings: Secondary | ICD-10-CM | POA: Diagnosis not present

## 2023-05-01 DIAGNOSIS — H353134 Nonexudative age-related macular degeneration, bilateral, advanced atrophic with subfoveal involvement: Secondary | ICD-10-CM | POA: Diagnosis not present

## 2023-05-01 DIAGNOSIS — Z23 Encounter for immunization: Secondary | ICD-10-CM | POA: Diagnosis not present

## 2023-05-01 DIAGNOSIS — H353190 Nonexudative age-related macular degeneration, unspecified eye, stage unspecified: Secondary | ICD-10-CM | POA: Diagnosis not present

## 2023-05-02 ENCOUNTER — Ambulatory Visit
Admission: RE | Admit: 2023-05-02 | Discharge: 2023-05-02 | Disposition: A | Discharge status: Home / Self Care | Payer: Medicare HMO | Source: Ambulatory Visit | Attending: Urology | Admitting: Urology

## 2023-05-02 DIAGNOSIS — C672 Malignant neoplasm of lateral wall of bladder: Secondary | ICD-10-CM | POA: Insufficient documentation

## 2023-05-02 DIAGNOSIS — Z08 Encounter for follow-up examination after completed treatment for malignant neoplasm: Secondary | ICD-10-CM | POA: Diagnosis not present

## 2023-05-02 DIAGNOSIS — Z0189 Encounter for other specified special examinations: Secondary | ICD-10-CM | POA: Diagnosis not present

## 2023-05-02 DIAGNOSIS — Z8551 Personal history of malignant neoplasm of bladder: Secondary | ICD-10-CM | POA: Diagnosis not present

## 2023-05-02 NOTE — Telephone Encounter (Signed)
Flu vaccine updated in chart

## 2023-05-03 ENCOUNTER — Other Ambulatory Visit: Payer: Self-pay | Admitting: Family Medicine

## 2023-05-03 ENCOUNTER — Encounter: Payer: Self-pay | Admitting: Family Medicine

## 2023-05-03 MED ORDER — AZITHROMYCIN 250 MG PO TABS: ORAL_TABLET | ORAL | 0 refills | Status: DC

## 2023-05-06 ENCOUNTER — Encounter: Payer: Self-pay | Admitting: Podiatry

## 2023-05-06 ENCOUNTER — Ambulatory Visit: Payer: Medicare HMO | Admitting: Podiatry

## 2023-05-06 VITALS — BP 174/70 | HR 84

## 2023-05-06 DIAGNOSIS — G6289 Other specified polyneuropathies: Secondary | ICD-10-CM

## 2023-05-06 DIAGNOSIS — I739 Peripheral vascular disease, unspecified: Secondary | ICD-10-CM

## 2023-05-06 DIAGNOSIS — B351 Tinea unguium: Secondary | ICD-10-CM

## 2023-05-06 DIAGNOSIS — L84 Corns and callosities: Secondary | ICD-10-CM | POA: Diagnosis not present

## 2023-05-06 DIAGNOSIS — M79676 Pain in unspecified toe(s): Secondary | ICD-10-CM | POA: Diagnosis not present

## 2023-05-07 ENCOUNTER — Encounter: Payer: Self-pay | Admitting: Podiatry

## 2023-05-07 NOTE — Progress Notes (Signed)
Subjective:  Patient ID: Misty Ferguson, female    DOB: [birth year],  MRN: [id number]  Misty Ferguson presents to clinic today for at risk foot care. Patient has h/o PAD and neuropathy and painful thick toenails that are difficult to trim. Pain interferes with ambulation. Aggravating factors include wearing enclosed shoe gear. Pain is relieved with periodic professional debridement. She is accompanied by her husband on today's visit.  Patient is followed by [facility] for PAD. Chief Complaint  Patient presents with   Nail Problem    "Cut my toenails."   New problem(s): None.   PCP is [name], Amy E, MD.  No Known Allergies  Review of Systems: Negative except as noted in the HPI.  Objective: No changes noted in today's physical examination. Vitals:   05/06/23 1014  BP: (!) 174/70  Pulse: 84   Misty Ferguson is a pleasant 89 y.o. female WD, WN in NAD. AAO x 3.  Vascular Examination: CFT <3 seconds b/l. DP/PT pulses faintly palpable b/l. Pedal hair diminished b/l. Skin temperature gradient warm to warm b/l. No pain with calf compression. No ischemia or gangrene. No cyanosis or clubbing noted b/l.    Neurological Examination: Sensation grossly intact b/l with 10 gram monofilament. Vibratory sensation intact b/l. Protective sensation diminished with 10g monofilament b/l.  Dermatological Examination: Pedal skin warm and supple b/l.   No open wounds. No interdigital macerations.  Toenails bilateral great toes thick, discolored, elongated with subungual debris and pain on dorsal palpation.    Hyperkeratotic lesion(s) lateral nailfold left 5th digit.  No erythema, no edema, no drainage, no fluctuance.  Musculoskeletal Examination: Muscle strength 5/5 to all lower extremity muscle groups bilaterally. Hammertoe(s) noted to the bilateral 5th toes.  Radiographs: None  Last A1c:      Latest Ref Rng & Units 11/06/2022    7:56 AM 07/25/2022    2:29 PM  Hemoglobin  A1C  Hemoglobin-A1c 4.6 - 6.5 % 5.7  5.6    Assessment/Plan: 1. Pain due to onychomycosis of toenail   2. Corns   3. Other polyneuropathy   4. PVD (peripheral vascular disease) (HCC)     -Consent given for treatment as described below: -Examined patient. -Patient to continue soft, supportive shoe gear daily. -Toenails were debrided in length and girth bilateral great toes with sterile nail nippers and dremel without iatrogenic bleeding.  -Nondystrophic toenails trimmed x8. -Corn(s) L 5th toe pared utilizing sterile scalpel blade without complication or incident. Total number debrided=1. -Patient/POA to call should there be question/concern in the interim.   Return in about 5 months (around 10/06/2023).  Freddie Breech, DPM

## 2023-05-09 ENCOUNTER — Ambulatory Visit: Payer: Medicare HMO | Admitting: Urology

## 2023-05-09 ENCOUNTER — Encounter: Payer: Self-pay | Admitting: Urology

## 2023-05-09 VITALS — BP 148/76 | HR 83 | Ht 61.0 in | Wt 90.0 lb

## 2023-05-09 DIAGNOSIS — C672 Malignant neoplasm of lateral wall of bladder: Secondary | ICD-10-CM | POA: Diagnosis not present

## 2023-05-09 NOTE — Progress Notes (Signed)
05/09/2023 1:47 PM   Misty Ferguson May 19, 1934 [id number]  Reason for visit: Follow up muscle invasive bladder cancer, history of hydronephrosis  HPI: 89 year old frail female with COPD on oxygen who originally presented in September 2020 with gross hematuria and CTU showed 3cm left sided bladder mass with left hydronephrosis, sCr 1.46(eGFR33).  She underwent TURBT 05/22/2019 with resection of all visible disease(HG T2 urothelial cell carcinoma with squamous differentiation), left ureteral stent placement. Renal function improved after stent to 0.98(eGFR 54), stent removed in follow-up.  She deferred concurrent chemotherapy, but did undergo radiation to the bladder and the pelvic lymph nodes with Dr. Rushie Chestnut completed in December 2020.  She h has vascular disease and remains on Plavix.  She has some mild weak urinary stream but denies any gross hematuria or other significant urinary symptoms.  We have had multiple conversations about surveillance, and with her age and comorbidities she strongly prefers less aggressive surveillance and understands the risk of recurrence.  She has opted for yearly renal/bladder ultrasounds.  I personally viewed and interpreted the ultrasound dated 05/02/2023 which shows no hydronephrosis, no recurrence of disease.  Formal radiology read still pending.  Most recent renal function from March 2024 remains normal with creatinine 0.81, EGFR greater than 60.  Overall, she has done very well considering her muscle invasive disease with squamous differentiation managed with maximal resection and radiation with no evidence of recurrence thus far.  We discussed return precautions at length including gross hematuria or flank pain.  Using shared decision making she would like to continue yearly renal/bladder ultrasound.  RTC 1 year with renal/bladder ultrasound prior  Sondra Come, MD  [facility] [address], [address] [place], [place]  [address] [phone]

## 2023-05-17 ENCOUNTER — Ambulatory Visit (INDEPENDENT_AMBULATORY_CARE_PROVIDER_SITE_OTHER): Payer: Medicare HMO | Admitting: Vascular Surgery

## 2023-05-17 ENCOUNTER — Encounter (INDEPENDENT_AMBULATORY_CARE_PROVIDER_SITE_OTHER): Payer: Medicare HMO

## 2023-05-20 ENCOUNTER — Ambulatory Visit (INDEPENDENT_AMBULATORY_CARE_PROVIDER_SITE_OTHER): Payer: Medicare HMO

## 2023-05-20 DIAGNOSIS — I739 Peripheral vascular disease, unspecified: Secondary | ICD-10-CM

## 2023-05-27 LAB — VAS US ABI WITH/WO TBI
Left ABI: 0.45
Right ABI: 0.89

## 2023-05-28 ENCOUNTER — Ambulatory Visit (INDEPENDENT_AMBULATORY_CARE_PROVIDER_SITE_OTHER): Payer: Medicare HMO | Admitting: Vascular Surgery

## 2023-05-28 ENCOUNTER — Encounter (INDEPENDENT_AMBULATORY_CARE_PROVIDER_SITE_OTHER): Payer: Self-pay | Admitting: Vascular Surgery

## 2023-05-28 VITALS — BP 124/71 | HR 99 | Resp 18 | Ht 60.0 in | Wt 90.0 lb

## 2023-05-28 DIAGNOSIS — R7303 Prediabetes: Secondary | ICD-10-CM

## 2023-05-28 DIAGNOSIS — E7800 Pure hypercholesterolemia, unspecified: Secondary | ICD-10-CM

## 2023-05-28 DIAGNOSIS — I70219 Atherosclerosis of native arteries of extremities with intermittent claudication, unspecified extremity: Secondary | ICD-10-CM | POA: Insufficient documentation

## 2023-05-28 DIAGNOSIS — I10 Essential (primary) hypertension: Secondary | ICD-10-CM | POA: Diagnosis not present

## 2023-05-28 DIAGNOSIS — I70212 Atherosclerosis of native arteries of extremities with intermittent claudication, left leg: Secondary | ICD-10-CM | POA: Diagnosis not present

## 2023-05-28 NOTE — H&P (View-Only) (Signed)
MRN : [id number]  Misty Ferguson is a 89 y.o. (11/06/1933) female who presents with chief complaint of  Chief Complaint  Patient presents with   Follow-up    6 month ABI  .  History of Present Illness: Patient returns today in follow up of her PAD.  She has developed worsening leg pain that is now waking her from sleep and causing her to dangle her legs.  This is predominantly in the left leg.  No open wounds.  She has extensive darkening and bruising from her thin skin and anticoagulants.  The pain has progressed over the past several months.  No fevers or chills.  No chest pain or shortness of breath worse than her chronic oxygen requiring COPD. Noninvasive studies were performed last week.  These demonstrated a stable right ABI of 0.89 but a marked reduction in the left ABI down to the 0.45 range.  Duplex shows significant elevation in velocities in the left external iliac artery consistent with a high-grade stenosis.  Current Outpatient Medications  Medication Sig Dispense Refill   albuterol (VENTOLIN HFA) 108 (90 Base) MCG/ACT inhaler Inhale 2 puffs into the lungs every 6 (six) hours as needed for wheezing or shortness of breath. 8 g 0   ascorbic acid (VITAMIN C) 1000 MG tablet Take 1,000 mg by mouth daily.      atorvastatin (LIPITOR) 10 MG tablet TAKE 1 TABLET EVERY DAY 90 tablet 3   azelastine (ASTELIN) 0.1 % nasal spray Place 2 sprays into both nostrils 2 (two) times daily. Use in each nostril as directed 30 mL 5   budesonide-formoterol (SYMBICORT) 160-4.5 MCG/ACT inhaler Inhale 2 puffs into the lungs in the morning and at bedtime. 3 each 0   Cholecalciferol (VITAMIN D) 125 MCG (5000 UT) CAPS Take 5,000 Units by mouth daily.     clopidogrel (PLAVIX) 75 MG tablet TAKE 1 TABLET EVERY DAY 90 tablet 3   docusate sodium (COLACE) 100 MG capsule Take 100 mg by mouth 2 (two) times daily.     losartan (COZAAR) 25 MG tablet 25 mg daily.      Multiple Vitamins-Minerals (PRESERVISION AREDS)  TABS Take 1 tablet by mouth 2 (two) times daily.     OXYGEN Inhale 4 L into the lungs as needed.     verapamil (VERELAN PM) 360 MG 24 hr capsule Take 360 mg by mouth daily.     Vitamin E 180 MG CAPS Take 180 mg by mouth daily.     No current facility-administered medications for this visit.    Past Medical History:  Diagnosis Date   AK (actinic keratosis) 12/08/2020   right pretibia inferior bx proven, LN2 01/10/21   Burping    Chronic airway obstruction, not elsewhere classified    Dyspnea    Dysrhythmia    Macular degeneration (senile) of retina, unspecified    Malignant neoplasm of urinary bladder (HCC) 03/2019   Partial bladder resection and Rad tx's.    Obstructive chronic bronchitis with exacerbation (HCC)    Osteoporosis, unspecified    Oxygen deficiency    Personal history of peptic ulcer disease    SCC (squamous cell carcinoma) 12/08/2020   right pretibia superior, EDC 01/10/21   SCC (squamous cell carcinoma) 07/03/2022   left medial calf, clear 09/25/22   SCC (squamous cell carcinoma) 11/22/2022   right thigh   treated with ED&C   Squamous cell carcinoma in situ (SCCIS) 07/03/2022   right pretibia sccis needs treatment with fluorouracil  lb (40.8 kg)   BMI 17.58 kg/m  Gen:  WD/WN, NAD Head: [place]/AT, No temporalis wasting. Ear/Nose/Throat: Hearing diminished, nares w/o erythema or drainage Eyes: Conjunctiva clear. Sclera non-icteric Neck: Supple.  Trachea midline Pulmonary:  Good air movement, no use of accessory muscles.  Cardiac: tachycardic Vascular:  Vessel Right Left  Radial Palpable Palpable                          PT 1+ Palpable 1+ Palpable  DP 1+ Palpable Not Palpable   Gastrointestinal: soft, non-tender/non-distended. No guarding/reflex.  Musculoskeletal: M/S 5/5 throughout.  Marked bruising over her legs bilaterally.  Trace left lower extremity edema. Neurologic: Sensation grossly intact in extremities.  Symmetrical.  Speech is fluent.  Psychiatric: Judgment intact, Mood & affect appropriate for pt's clinical situation. Dermatologic: No rashes or ulcers noted.  No cellulitis or open wounds.      Labs Recent Results (from the past 2160 hour(s))  VAS [place] ABI WITH/WO TBI     Status: None   Collection Time: 05/20/23 11:30 AM  Result Value Ref Range   Right ABI 0.89    Left ABI 0.45     Radiology VAS [place] ABI WITH/WO TBI  Result Date: 05/27/2023  LOWER EXTREMITY DOPPLER STUDY Patient Name:  Misty Ferguson  Date of Exam:   05/20/2023 Medical Rec #: [id number]       Accession #:    [id number] Date of Birth: Jun 17, 1934      Patient Gender: F Patient Age:   67 years Exam Location:  [facility] Procedure:      VAS [place] ABI WITH/WO TBI Referring  Phys: Barbara Cower [place] Greb --------------------------------------------------------------------------------  Indications: Peripheral artery disease, and Decreased pulses; C/O bilateral heel              pain (R>L). High Risk Factors: Hypertension, past history of smoking.  Vascular Interventions: 11/20/2021: Rt EIA/CIA Stent PTA RT SFA.                          09/03/2022: Aortogram and Selective Left Lower Extremity                         Angiogram. PTA of Left SFA and Popliteal Artery with 5                         mm x 22 cm length Lutonix drug coated balloon. Stent                         placement with 6 mm x 6 cm and 6 mm x 4 cm Lifestents to                         the distal SFA and above knee Popliteal Artery. PTA of                         Left EIA wit 6 mm diameter 4 cm length Lutonix drug                         coated angioplasty balloon. Performing Technologist: Hardie Lora RVT  Examination Guidelines: A complete evaluation includes at minimum, Doppler waveform signals and systolic blood pressure reading at the level of bilateral brachial,  MRN : [id number]  Misty Ferguson is a 89 y.o. (11/06/1933) female who presents with chief complaint of  Chief Complaint  Patient presents with   Follow-up    6 month ABI  .  History of Present Illness: Patient returns today in follow up of her PAD.  She has developed worsening leg pain that is now waking her from sleep and causing her to dangle her legs.  This is predominantly in the left leg.  No open wounds.  She has extensive darkening and bruising from her thin skin and anticoagulants.  The pain has progressed over the past several months.  No fevers or chills.  No chest pain or shortness of breath worse than her chronic oxygen requiring COPD. Noninvasive studies were performed last week.  These demonstrated a stable right ABI of 0.89 but a marked reduction in the left ABI down to the 0.45 range.  Duplex shows significant elevation in velocities in the left external iliac artery consistent with a high-grade stenosis.  Current Outpatient Medications  Medication Sig Dispense Refill   albuterol (VENTOLIN HFA) 108 (90 Base) MCG/ACT inhaler Inhale 2 puffs into the lungs every 6 (six) hours as needed for wheezing or shortness of breath. 8 g 0   ascorbic acid (VITAMIN C) 1000 MG tablet Take 1,000 mg by mouth daily.      atorvastatin (LIPITOR) 10 MG tablet TAKE 1 TABLET EVERY DAY 90 tablet 3   azelastine (ASTELIN) 0.1 % nasal spray Place 2 sprays into both nostrils 2 (two) times daily. Use in each nostril as directed 30 mL 5   budesonide-formoterol (SYMBICORT) 160-4.5 MCG/ACT inhaler Inhale 2 puffs into the lungs in the morning and at bedtime. 3 each 0   Cholecalciferol (VITAMIN D) 125 MCG (5000 UT) CAPS Take 5,000 Units by mouth daily.     clopidogrel (PLAVIX) 75 MG tablet TAKE 1 TABLET EVERY DAY 90 tablet 3   docusate sodium (COLACE) 100 MG capsule Take 100 mg by mouth 2 (two) times daily.     losartan (COZAAR) 25 MG tablet 25 mg daily.      Multiple Vitamins-Minerals (PRESERVISION AREDS)  TABS Take 1 tablet by mouth 2 (two) times daily.     OXYGEN Inhale 4 L into the lungs as needed.     verapamil (VERELAN PM) 360 MG 24 hr capsule Take 360 mg by mouth daily.     Vitamin E 180 MG CAPS Take 180 mg by mouth daily.     No current facility-administered medications for this visit.    Past Medical History:  Diagnosis Date   AK (actinic keratosis) 12/08/2020   right pretibia inferior bx proven, LN2 01/10/21   Burping    Chronic airway obstruction, not elsewhere classified    Dyspnea    Dysrhythmia    Macular degeneration (senile) of retina, unspecified    Malignant neoplasm of urinary bladder (HCC) 03/2019   Partial bladder resection and Rad tx's.    Obstructive chronic bronchitis with exacerbation (HCC)    Osteoporosis, unspecified    Oxygen deficiency    Personal history of peptic ulcer disease    SCC (squamous cell carcinoma) 12/08/2020   right pretibia superior, EDC 01/10/21   SCC (squamous cell carcinoma) 07/03/2022   left medial calf, clear 09/25/22   SCC (squamous cell carcinoma) 11/22/2022   right thigh   treated with ED&C   Squamous cell carcinoma in situ (SCCIS) 07/03/2022   right pretibia sccis needs treatment with fluorouracil  lb (40.8 kg)   BMI 17.58 kg/m  Gen:  WD/WN, NAD Head: [place]/AT, No temporalis wasting. Ear/Nose/Throat: Hearing diminished, nares w/o erythema or drainage Eyes: Conjunctiva clear. Sclera non-icteric Neck: Supple.  Trachea midline Pulmonary:  Good air movement, no use of accessory muscles.  Cardiac: tachycardic Vascular:  Vessel Right Left  Radial Palpable Palpable                          PT 1+ Palpable 1+ Palpable  DP 1+ Palpable Not Palpable   Gastrointestinal: soft, non-tender/non-distended. No guarding/reflex.  Musculoskeletal: M/S 5/5 throughout.  Marked bruising over her legs bilaterally.  Trace left lower extremity edema. Neurologic: Sensation grossly intact in extremities.  Symmetrical.  Speech is fluent.  Psychiatric: Judgment intact, Mood & affect appropriate for pt's clinical situation. Dermatologic: No rashes or ulcers noted.  No cellulitis or open wounds.      Labs Recent Results (from the past 2160 hour(s))  VAS [place] ABI WITH/WO TBI     Status: None   Collection Time: 05/20/23 11:30 AM  Result Value Ref Range   Right ABI 0.89    Left ABI 0.45     Radiology VAS [place] ABI WITH/WO TBI  Result Date: 05/27/2023  LOWER EXTREMITY DOPPLER STUDY Patient Name:  Misty Ferguson  Date of Exam:   05/20/2023 Medical Rec #: [id number]       Accession #:    [id number] Date of Birth: Jun 17, 1934      Patient Gender: F Patient Age:   67 years Exam Location:  [facility] Procedure:      VAS [place] ABI WITH/WO TBI Referring  Phys: Barbara Cower England Greb --------------------------------------------------------------------------------  Indications: Peripheral artery disease, and Decreased pulses; C/O bilateral heel              pain (R>L). High Risk Factors: Hypertension, past history of smoking.  Vascular Interventions: 11/20/2021: Rt EIA/CIA Stent PTA RT SFA.                          09/03/2022: Aortogram and Selective Left Lower Extremity                         Angiogram. PTA of Left SFA and Popliteal Artery with 5                         mm x 22 cm length Lutonix drug coated balloon. Stent                         placement with 6 mm x 6 cm and 6 mm x 4 cm Lifestents to                         the distal SFA and above knee Popliteal Artery. PTA of                         Left EIA wit 6 mm diameter 4 cm length Lutonix drug                         coated angioplasty balloon. Performing Technologist: Hardie Lora RVT  Examination Guidelines: A complete evaluation includes at minimum, Doppler waveform signals and systolic blood pressure reading at the level of bilateral brachial,  MRN : [id number]  Misty Ferguson is a 89 y.o. (11/06/1933) female who presents with chief complaint of  Chief Complaint  Patient presents with   Follow-up    6 month ABI  .  History of Present Illness: Patient returns today in follow up of her PAD.  She has developed worsening leg pain that is now waking her from sleep and causing her to dangle her legs.  This is predominantly in the left leg.  No open wounds.  She has extensive darkening and bruising from her thin skin and anticoagulants.  The pain has progressed over the past several months.  No fevers or chills.  No chest pain or shortness of breath worse than her chronic oxygen requiring COPD. Noninvasive studies were performed last week.  These demonstrated a stable right ABI of 0.89 but a marked reduction in the left ABI down to the 0.45 range.  Duplex shows significant elevation in velocities in the left external iliac artery consistent with a high-grade stenosis.  Current Outpatient Medications  Medication Sig Dispense Refill   albuterol (VENTOLIN HFA) 108 (90 Base) MCG/ACT inhaler Inhale 2 puffs into the lungs every 6 (six) hours as needed for wheezing or shortness of breath. 8 g 0   ascorbic acid (VITAMIN C) 1000 MG tablet Take 1,000 mg by mouth daily.      atorvastatin (LIPITOR) 10 MG tablet TAKE 1 TABLET EVERY DAY 90 tablet 3   azelastine (ASTELIN) 0.1 % nasal spray Place 2 sprays into both nostrils 2 (two) times daily. Use in each nostril as directed 30 mL 5   budesonide-formoterol (SYMBICORT) 160-4.5 MCG/ACT inhaler Inhale 2 puffs into the lungs in the morning and at bedtime. 3 each 0   Cholecalciferol (VITAMIN D) 125 MCG (5000 UT) CAPS Take 5,000 Units by mouth daily.     clopidogrel (PLAVIX) 75 MG tablet TAKE 1 TABLET EVERY DAY 90 tablet 3   docusate sodium (COLACE) 100 MG capsule Take 100 mg by mouth 2 (two) times daily.     losartan (COZAAR) 25 MG tablet 25 mg daily.      Multiple Vitamins-Minerals (PRESERVISION AREDS)  TABS Take 1 tablet by mouth 2 (two) times daily.     OXYGEN Inhale 4 L into the lungs as needed.     verapamil (VERELAN PM) 360 MG 24 hr capsule Take 360 mg by mouth daily.     Vitamin E 180 MG CAPS Take 180 mg by mouth daily.     No current facility-administered medications for this visit.    Past Medical History:  Diagnosis Date   AK (actinic keratosis) 12/08/2020   right pretibia inferior bx proven, LN2 01/10/21   Burping    Chronic airway obstruction, not elsewhere classified    Dyspnea    Dysrhythmia    Macular degeneration (senile) of retina, unspecified    Malignant neoplasm of urinary bladder (HCC) 03/2019   Partial bladder resection and Rad tx's.    Obstructive chronic bronchitis with exacerbation (HCC)    Osteoporosis, unspecified    Oxygen deficiency    Personal history of peptic ulcer disease    SCC (squamous cell carcinoma) 12/08/2020   right pretibia superior, EDC 01/10/21   SCC (squamous cell carcinoma) 07/03/2022   left medial calf, clear 09/25/22   SCC (squamous cell carcinoma) 11/22/2022   right thigh   treated with ED&C   Squamous cell carcinoma in situ (SCCIS) 07/03/2022   right pretibia sccis needs treatment with fluorouracil

## 2023-05-28 NOTE — Progress Notes (Signed)
lb (40.8 kg)   BMI 17.58 kg/m  Gen:  WD/WN, NAD Head: [place]/AT, No temporalis wasting. Ear/Nose/Throat: Hearing diminished, nares w/o erythema or drainage Eyes: Conjunctiva clear. Sclera non-icteric Neck: Supple.  Trachea midline Pulmonary:  Good air movement, no use of accessory muscles.  Cardiac: tachycardic Vascular:  Vessel Right Left  Radial Palpable Palpable                          PT 1+ Palpable 1+ Palpable  DP 1+ Palpable Not Palpable   Gastrointestinal: soft, non-tender/non-distended. No guarding/reflex.  Musculoskeletal: M/S 5/5 throughout.  Marked bruising over her legs bilaterally.  Trace left lower extremity edema. Neurologic: Sensation grossly intact in extremities.  Symmetrical.  Speech is fluent.  Psychiatric: Judgment intact, Mood & affect appropriate for pt's clinical situation. Dermatologic: No rashes or ulcers noted.  No cellulitis or open wounds.      Labs Recent Results (from the past 2160 hour(s))  VAS [place] ABI WITH/WO TBI     Status: None   Collection Time: 05/20/23 11:30 AM  Result Value Ref Range   Right ABI 0.89    Left ABI 0.45     Radiology VAS [place] ABI WITH/WO TBI  Result Date: 05/27/2023  LOWER EXTREMITY DOPPLER STUDY Patient Name:  Misty Ferguson  Date of Exam:   05/20/2023 Medical Rec #: [id number]       Accession #:    [id number] Date of Birth: Jun 17, 1934      Patient Gender: F Patient Age:   89 years Exam Location:  [facility] Procedure:      VAS [place] ABI WITH/WO TBI Referring  Phys: Barbara Cower England Greb --------------------------------------------------------------------------------  Indications: Peripheral artery disease, and Decreased pulses; C/O bilateral heel              pain (R>L). High Risk Factors: Hypertension, past history of smoking.  Vascular Interventions: 11/20/2021: Rt EIA/CIA Stent PTA RT SFA.                          09/03/2022: Aortogram and Selective Left Lower Extremity                         Angiogram. PTA of Left SFA and Popliteal Artery with 5                         mm x 22 cm length Lutonix drug coated balloon. Stent                         placement with 6 mm x 6 cm and 6 mm x 4 cm Lifestents to                         the distal SFA and above knee Popliteal Artery. PTA of                         Left EIA wit 6 mm diameter 4 cm length Lutonix drug                         coated angioplasty balloon. Performing Technologist: Hardie Lora RVT  Examination Guidelines: A complete evaluation includes at minimum, Doppler waveform signals and systolic blood pressure reading at the level of bilateral brachial,  MRN : [id number]  Misty Ferguson is a 89 y.o. (11/06/1933) female who presents with chief complaint of  Chief Complaint  Patient presents with   Follow-up    6 month ABI  .  History of Present Illness: Patient returns today in follow up of her PAD.  She has developed worsening leg pain that is now waking her from sleep and causing her to dangle her legs.  This is predominantly in the left leg.  No open wounds.  She has extensive darkening and bruising from her thin skin and anticoagulants.  The pain has progressed over the past several months.  No fevers or chills.  No chest pain or shortness of breath worse than her chronic oxygen requiring COPD. Noninvasive studies were performed last week.  These demonstrated a stable right ABI of 0.89 but a marked reduction in the left ABI down to the 0.45 range.  Duplex shows significant elevation in velocities in the left external iliac artery consistent with a high-grade stenosis.  Current Outpatient Medications  Medication Sig Dispense Refill   albuterol (VENTOLIN HFA) 108 (90 Base) MCG/ACT inhaler Inhale 2 puffs into the lungs every 6 (six) hours as needed for wheezing or shortness of breath. 8 g 0   ascorbic acid (VITAMIN C) 1000 MG tablet Take 1,000 mg by mouth daily.      atorvastatin (LIPITOR) 10 MG tablet TAKE 1 TABLET EVERY DAY 90 tablet 3   azelastine (ASTELIN) 0.1 % nasal spray Place 2 sprays into both nostrils 2 (two) times daily. Use in each nostril as directed 30 mL 5   budesonide-formoterol (SYMBICORT) 160-4.5 MCG/ACT inhaler Inhale 2 puffs into the lungs in the morning and at bedtime. 3 each 0   Cholecalciferol (VITAMIN D) 125 MCG (5000 UT) CAPS Take 5,000 Units by mouth daily.     clopidogrel (PLAVIX) 75 MG tablet TAKE 1 TABLET EVERY DAY 90 tablet 3   docusate sodium (COLACE) 100 MG capsule Take 100 mg by mouth 2 (two) times daily.     losartan (COZAAR) 25 MG tablet 25 mg daily.      Multiple Vitamins-Minerals (PRESERVISION AREDS)  TABS Take 1 tablet by mouth 2 (two) times daily.     OXYGEN Inhale 4 L into the lungs as needed.     verapamil (VERELAN PM) 360 MG 24 hr capsule Take 360 mg by mouth daily.     Vitamin E 180 MG CAPS Take 180 mg by mouth daily.     No current facility-administered medications for this visit.    Past Medical History:  Diagnosis Date   AK (actinic keratosis) 12/08/2020   right pretibia inferior bx proven, LN2 01/10/21   Burping    Chronic airway obstruction, not elsewhere classified    Dyspnea    Dysrhythmia    Macular degeneration (senile) of retina, unspecified    Malignant neoplasm of urinary bladder (HCC) 03/2019   Partial bladder resection and Rad tx's.    Obstructive chronic bronchitis with exacerbation (HCC)    Osteoporosis, unspecified    Oxygen deficiency    Personal history of peptic ulcer disease    SCC (squamous cell carcinoma) 12/08/2020   right pretibia superior, EDC 01/10/21   SCC (squamous cell carcinoma) 07/03/2022   left medial calf, clear 09/25/22   SCC (squamous cell carcinoma) 11/22/2022   right thigh   treated with ED&C   Squamous cell carcinoma in situ (SCCIS) 07/03/2022   right pretibia sccis needs treatment with fluorouracil  MRN : [id number]  Misty Ferguson is a 89 y.o. (11/06/1933) female who presents with chief complaint of  Chief Complaint  Patient presents with   Follow-up    6 month ABI  .  History of Present Illness: Patient returns today in follow up of her PAD.  She has developed worsening leg pain that is now waking her from sleep and causing her to dangle her legs.  This is predominantly in the left leg.  No open wounds.  She has extensive darkening and bruising from her thin skin and anticoagulants.  The pain has progressed over the past several months.  No fevers or chills.  No chest pain or shortness of breath worse than her chronic oxygen requiring COPD. Noninvasive studies were performed last week.  These demonstrated a stable right ABI of 0.89 but a marked reduction in the left ABI down to the 0.45 range.  Duplex shows significant elevation in velocities in the left external iliac artery consistent with a high-grade stenosis.  Current Outpatient Medications  Medication Sig Dispense Refill   albuterol (VENTOLIN HFA) 108 (90 Base) MCG/ACT inhaler Inhale 2 puffs into the lungs every 6 (six) hours as needed for wheezing or shortness of breath. 8 g 0   ascorbic acid (VITAMIN C) 1000 MG tablet Take 1,000 mg by mouth daily.      atorvastatin (LIPITOR) 10 MG tablet TAKE 1 TABLET EVERY DAY 90 tablet 3   azelastine (ASTELIN) 0.1 % nasal spray Place 2 sprays into both nostrils 2 (two) times daily. Use in each nostril as directed 30 mL 5   budesonide-formoterol (SYMBICORT) 160-4.5 MCG/ACT inhaler Inhale 2 puffs into the lungs in the morning and at bedtime. 3 each 0   Cholecalciferol (VITAMIN D) 125 MCG (5000 UT) CAPS Take 5,000 Units by mouth daily.     clopidogrel (PLAVIX) 75 MG tablet TAKE 1 TABLET EVERY DAY 90 tablet 3   docusate sodium (COLACE) 100 MG capsule Take 100 mg by mouth 2 (two) times daily.     losartan (COZAAR) 25 MG tablet 25 mg daily.      Multiple Vitamins-Minerals (PRESERVISION AREDS)  TABS Take 1 tablet by mouth 2 (two) times daily.     OXYGEN Inhale 4 L into the lungs as needed.     verapamil (VERELAN PM) 360 MG 24 hr capsule Take 360 mg by mouth daily.     Vitamin E 180 MG CAPS Take 180 mg by mouth daily.     No current facility-administered medications for this visit.    Past Medical History:  Diagnosis Date   AK (actinic keratosis) 12/08/2020   right pretibia inferior bx proven, LN2 01/10/21   Burping    Chronic airway obstruction, not elsewhere classified    Dyspnea    Dysrhythmia    Macular degeneration (senile) of retina, unspecified    Malignant neoplasm of urinary bladder (HCC) 03/2019   Partial bladder resection and Rad tx's.    Obstructive chronic bronchitis with exacerbation (HCC)    Osteoporosis, unspecified    Oxygen deficiency    Personal history of peptic ulcer disease    SCC (squamous cell carcinoma) 12/08/2020   right pretibia superior, EDC 01/10/21   SCC (squamous cell carcinoma) 07/03/2022   left medial calf, clear 09/25/22   SCC (squamous cell carcinoma) 11/22/2022   right thigh   treated with ED&C   Squamous cell carcinoma in situ (SCCIS) 07/03/2022   right pretibia sccis needs treatment with fluorouracil  lb (40.8 kg)   BMI 17.58 kg/m  Gen:  WD/WN, NAD Head: [place]/AT, No temporalis wasting. Ear/Nose/Throat: Hearing diminished, nares w/o erythema or drainage Eyes: Conjunctiva clear. Sclera non-icteric Neck: Supple.  Trachea midline Pulmonary:  Good air movement, no use of accessory muscles.  Cardiac: tachycardic Vascular:  Vessel Right Left  Radial Palpable Palpable                          PT 1+ Palpable 1+ Palpable  DP 1+ Palpable Not Palpable   Gastrointestinal: soft, non-tender/non-distended. No guarding/reflex.  Musculoskeletal: M/S 5/5 throughout.  Marked bruising over her legs bilaterally.  Trace left lower extremity edema. Neurologic: Sensation grossly intact in extremities.  Symmetrical.  Speech is fluent.  Psychiatric: Judgment intact, Mood & affect appropriate for pt's clinical situation. Dermatologic: No rashes or ulcers noted.  No cellulitis or open wounds.      Labs Recent Results (from the past 2160 hour(s))  VAS [place] ABI WITH/WO TBI     Status: None   Collection Time: 05/20/23 11:30 AM  Result Value Ref Range   Right ABI 0.89    Left ABI 0.45     Radiology VAS [place] ABI WITH/WO TBI  Result Date: 05/27/2023  LOWER EXTREMITY DOPPLER STUDY Patient Name:  Misty Ferguson  Date of Exam:   05/20/2023 Medical Rec #: [id number]       Accession #:    [id number] Date of Birth: Jun 17, 1934      Patient Gender: F Patient Age:   89 years Exam Location:  [facility] Procedure:      VAS [place] ABI WITH/WO TBI Referring  Phys: Barbara Cower England Greb --------------------------------------------------------------------------------  Indications: Peripheral artery disease, and Decreased pulses; C/O bilateral heel              pain (R>L). High Risk Factors: Hypertension, past history of smoking.  Vascular Interventions: 11/20/2021: Rt EIA/CIA Stent PTA RT SFA.                          09/03/2022: Aortogram and Selective Left Lower Extremity                         Angiogram. PTA of Left SFA and Popliteal Artery with 5                         mm x 22 cm length Lutonix drug coated balloon. Stent                         placement with 6 mm x 6 cm and 6 mm x 4 cm Lifestents to                         the distal SFA and above knee Popliteal Artery. PTA of                         Left EIA wit 6 mm diameter 4 cm length Lutonix drug                         coated angioplasty balloon. Performing Technologist: Hardie Lora RVT  Examination Guidelines: A complete evaluation includes at minimum, Doppler waveform signals and systolic blood pressure reading at the level of bilateral brachial,  lb (40.8 kg)   BMI 17.58 kg/m  Gen:  WD/WN, NAD Head: [place]/AT, No temporalis wasting. Ear/Nose/Throat: Hearing diminished, nares w/o erythema or drainage Eyes: Conjunctiva clear. Sclera non-icteric Neck: Supple.  Trachea midline Pulmonary:  Good air movement, no use of accessory muscles.  Cardiac: tachycardic Vascular:  Vessel Right Left  Radial Palpable Palpable                          PT 1+ Palpable 1+ Palpable  DP 1+ Palpable Not Palpable   Gastrointestinal: soft, non-tender/non-distended. No guarding/reflex.  Musculoskeletal: M/S 5/5 throughout.  Marked bruising over her legs bilaterally.  Trace left lower extremity edema. Neurologic: Sensation grossly intact in extremities.  Symmetrical.  Speech is fluent.  Psychiatric: Judgment intact, Mood & affect appropriate for pt's clinical situation. Dermatologic: No rashes or ulcers noted.  No cellulitis or open wounds.      Labs Recent Results (from the past 2160 hour(s))  VAS [place] ABI WITH/WO TBI     Status: None   Collection Time: 05/20/23 11:30 AM  Result Value Ref Range   Right ABI 0.89    Left ABI 0.45     Radiology VAS [place] ABI WITH/WO TBI  Result Date: 05/27/2023  LOWER EXTREMITY DOPPLER STUDY Patient Name:  Misty Ferguson  Date of Exam:   05/20/2023 Medical Rec #: [id number]       Accession #:    [id number] Date of Birth: Jun 17, 1934      Patient Gender: F Patient Age:   89 years Exam Location:  [facility] Procedure:      VAS [place] ABI WITH/WO TBI Referring  Phys: Barbara Cower England Greb --------------------------------------------------------------------------------  Indications: Peripheral artery disease, and Decreased pulses; C/O bilateral heel              pain (R>L). High Risk Factors: Hypertension, past history of smoking.  Vascular Interventions: 11/20/2021: Rt EIA/CIA Stent PTA RT SFA.                          09/03/2022: Aortogram and Selective Left Lower Extremity                         Angiogram. PTA of Left SFA and Popliteal Artery with 5                         mm x 22 cm length Lutonix drug coated balloon. Stent                         placement with 6 mm x 6 cm and 6 mm x 4 cm Lifestents to                         the distal SFA and above knee Popliteal Artery. PTA of                         Left EIA wit 6 mm diameter 4 cm length Lutonix drug                         coated angioplasty balloon. Performing Technologist: Hardie Lora RVT  Examination Guidelines: A complete evaluation includes at minimum, Doppler waveform signals and systolic blood pressure reading at the level of bilateral brachial,

## 2023-05-29 ENCOUNTER — Other Ambulatory Visit: Payer: Self-pay | Admitting: Internal Medicine

## 2023-05-29 NOTE — Telephone Encounter (Signed)
Dr. Belia Heman, please advise if okay to order. Thanks

## 2023-05-29 NOTE — Patient Instructions (Signed)
Endovascular Therapy for Peripheral Vascular Disease, Care After The following information offers guidance on how to care for yourself after your procedure. Your health care provider may also give you more specific instructions. If you have problems or questions, contact your health care provider. What can I expect after the procedure? After the procedure, it is common to have: Pain. Soreness and bruising around your puncture or incision (access site). Fatigue. Follow these instructions at home: Access site care  Follow instructions from your health care provider about how to take care of your access site. Make sure you: Wash your hands with soap and water for at least 20 seconds before and after you change your bandage (dressing). If soap and water are not available, use hand sanitizer. Change your dressing as told by your health care provider. Leave stitches (sutures), skin glue, or adhesive strips in place. If adhesive strip edges start to loosen and curl up, you may trim the loose edges. Do not remove adhesive strips or skin glue completely unless your health care provider tells you to do that. Check your access site every day for signs of infection. Check for: More redness, swelling, or pain. A lump or bump. Fluid or blood. Warmth. Pus or a bad smell. Medicines Take over-the-counter and prescription medicines only as told by your health care provider. You may need to take medicines to prevent blood clots and to lower your cholesterol. If you were prescribed an antibiotic medicine, take it as told by your health care provider. Do not stop taking the antibiotic even if you start to feel better. Driving Do not drive until your health care provider approves. If you were given a sedative during the procedure, it can affect you for several hours. Do not drive or operate machinery until your health care provider says that it is safe. Ask your health care provider if the medicine prescribed to  you requires you to avoid driving or using machinery. Activity Rest as told by your health care provider. Avoid sitting for a long time without moving. Get up to take short walks every 1-2 hours. This is important to improve blood flow and breathing. Ask for help if you feel weak or unsteady. Do not lift anything that is heavier than 10 lb (4.5 kg), or the limit that you are told, until your health care provider says that it is safe. Avoid activity that requires a lot of effort, such as exercise and sports, as told by your health care provider. Avoid sexual activity until your health care provider says it is safe. Follow your exercise plan as told by your health care provider. Return to your normal activities as told by your health care provider. Ask your health care provider what activities are safe for you. Eating and drinking Drink fluids as instructed to help flush out the dye used during the procedure. Follow instructions from your health care provider about eating or drinking restrictions. You may need to eat a diet that is low in salt (sodium) and fat. Avoid drinking alcohol. General instructions Do not take baths, swim, or use a hot tub until your health care provider approves. Ask your health care provider if you may take showers. You may only be allowed to take sponge baths. Do not use any products that contain nicotine or tobacco. These products include cigarettes, chewing tobacco, and vaping devices, such as e-cigarettes. If you need help quitting, ask your health care provider. Keep all follow-up visits. This is important. Contact a health care  provider if: You have a fever. You have severe pain that does not get better with medicine. You have more redness, swelling, or pain around your access site. You have a lump or bump at your access site. Get help right away if:  You have fluid or blood coming from your access site. If this happens, lie down on your back and apply pressure  to the area. You have chest pain. You have problems breathing. You have pain, numbness, or tingling in your legs. You faint. You have any symptoms of a stroke. "BE FAST" is an easy way to remember the main warning signs of a stroke: B - Balance. Signs are dizziness, sudden trouble walking, or loss of balance. E - Eyes. Signs are trouble seeing or a sudden change in vision. F - Face. Signs are sudden weakness or numbness of the face, or the face or eyelid drooping on one side. A - Arms. Signs are weakness or numbness in an arm. This happens suddenly and usually on one side of the body. S - Speech. Signs are sudden trouble speaking, slurred speech, or trouble understanding what people say. T - Time. Time to call emergency services. Write down what time symptoms started. You have other signs of a stroke, such as: A sudden, severe headache with no known cause. Nausea or vomiting. Seizure. These symptoms may represent a serious problem that is an emergency. Do not wait to see if the symptoms will go away. Get medical help right away. Call your local emergency services (911 in the [place] Do not drive yourself to the hospital. Summary After the procedure, it is common to have pain and soreness near your puncture or incision (access site). Check your access site every day for signs of infection, such as redness, swelling, or pain. You may need to take medicines to prevent blood clots and to lower your cholesterol. If you have any signs of a stroke, get help right away. This information is not intended to replace advice given to you by your health care provider. Make sure you discuss any questions you have with your health care provider. Document Revised: 08/24/2021 Document Reviewed: 02/01/2020 Elsevier Patient Education  2024 [employer].

## 2023-05-29 NOTE — Assessment & Plan Note (Signed)
Noninvasive studies were performed last week.  These demonstrated a stable right ABI of 0.89 but a marked reduction in the left ABI down to the 0.45 range.  Duplex shows significant elevation in velocities in the left external iliac artery consistent with a high-grade stenosis.  She has had significant progression of her ischemic symptoms and now seems to have symptoms consistent with ischemic rest pain of the left lower extremity.  This is a critical and limb threatening situation.  Given her findings on noninvasive studies and her symptoms, I believe an angiogram with possible revascularization would be the most prudent next step.  I discussed the risks and benefits of the procedure.  She understands and is agreeable to proceed.

## 2023-05-31 ENCOUNTER — Telehealth (INDEPENDENT_AMBULATORY_CARE_PROVIDER_SITE_OTHER): Payer: Self-pay

## 2023-05-31 NOTE — Telephone Encounter (Signed)
Spoke with the patient and she is scheduled with Dr. Wyn Quaker on 06/03/23 with a 8:00 am arrival time to the [facility] for a left leg angio. Pre-procedure instructions were discussed and will be sent to Mychart.

## 2023-06-03 ENCOUNTER — Encounter: Payer: Self-pay | Admitting: Vascular Surgery

## 2023-06-03 ENCOUNTER — Ambulatory Visit
Admission: RE | Admit: 2023-06-03 | Discharge: 2023-06-03 | Disposition: A | Discharge status: Home / Self Care | Payer: Medicare HMO | Attending: Vascular Surgery | Admitting: Vascular Surgery

## 2023-06-03 ENCOUNTER — Other Ambulatory Visit: Payer: Self-pay

## 2023-06-03 ENCOUNTER — Encounter
Admission: RE | Disposition: A | Discharge status: Home / Self Care | Payer: Self-pay | Source: Home / Self Care | Attending: Vascular Surgery

## 2023-06-03 DIAGNOSIS — R7303 Prediabetes: Secondary | ICD-10-CM | POA: Diagnosis not present

## 2023-06-03 DIAGNOSIS — I70212 Atherosclerosis of native arteries of extremities with intermittent claudication, left leg: Secondary | ICD-10-CM | POA: Insufficient documentation

## 2023-06-03 DIAGNOSIS — Z7951 Long term (current) use of inhaled steroids: Secondary | ICD-10-CM | POA: Diagnosis not present

## 2023-06-03 DIAGNOSIS — I70222 Atherosclerosis of native arteries of extremities with rest pain, left leg: Secondary | ICD-10-CM | POA: Diagnosis not present

## 2023-06-03 DIAGNOSIS — J449 Chronic obstructive pulmonary disease, unspecified: Secondary | ICD-10-CM | POA: Insufficient documentation

## 2023-06-03 DIAGNOSIS — E7800 Pure hypercholesterolemia, unspecified: Secondary | ICD-10-CM | POA: Diagnosis not present

## 2023-06-03 DIAGNOSIS — T82856A Stenosis of peripheral vascular stent, initial encounter: Secondary | ICD-10-CM | POA: Diagnosis not present

## 2023-06-03 DIAGNOSIS — Z87891 Personal history of nicotine dependence: Secondary | ICD-10-CM | POA: Diagnosis not present

## 2023-06-03 DIAGNOSIS — I7789 Other specified disorders of arteries and arterioles: Secondary | ICD-10-CM

## 2023-06-03 DIAGNOSIS — T82858A Stenosis of vascular prosthetic devices, implants and grafts, initial encounter: Secondary | ICD-10-CM | POA: Insufficient documentation

## 2023-06-03 DIAGNOSIS — T82868A Thrombosis of vascular prosthetic devices, implants and grafts, initial encounter: Secondary | ICD-10-CM | POA: Diagnosis not present

## 2023-06-03 DIAGNOSIS — I10 Essential (primary) hypertension: Secondary | ICD-10-CM | POA: Insufficient documentation

## 2023-06-03 DIAGNOSIS — Z9981 Dependence on supplemental oxygen: Secondary | ICD-10-CM | POA: Diagnosis not present

## 2023-06-03 DIAGNOSIS — I743 Embolism and thrombosis of arteries of the lower extremities: Secondary | ICD-10-CM | POA: Diagnosis not present

## 2023-06-03 DIAGNOSIS — I70219 Atherosclerosis of native arteries of extremities with intermittent claudication, unspecified extremity: Secondary | ICD-10-CM

## 2023-06-03 HISTORY — PX: LOWER EXTREMITY ANGIOGRAPHY: CATH118251

## 2023-06-03 LAB — CREATININE, SERUM
Creatinine, Ser: 0.71 mg/dL (ref 0.44–1.00)
GFR, Estimated: >60 mL/min (ref >60)

## 2023-06-03 SURGERY — LOWER EXTREMITY ANGIOGRAPHY
Anesthesia: Moderate Sedation | Site: Leg Lower | Laterality: Left

## 2023-06-03 MED ORDER — IODIXANOL 320 MG/ML IV SOLN
INTRAVENOUS | Status: DC | PRN
  Administered 2023-06-03: 60 mL

## 2023-06-03 MED ORDER — SODIUM CHLORIDE 0.9 % IV SOLN: 250.0000 mL | INTRAVENOUS | Status: DC | PRN

## 2023-06-03 MED ORDER — HYDROMORPHONE HCL 1 MG/ML IJ SOLN: 1.0000 mg | Freq: Once | INTRAMUSCULAR | Status: DC | PRN

## 2023-06-03 MED ORDER — MIDAZOLAM HCL 2 MG/ML PO SYRP: 8.0000 mg | ORAL_SOLUTION | Freq: Once | ORAL | Status: DC | PRN

## 2023-06-03 MED ORDER — HEPARIN SODIUM (PORCINE) 1000 UNIT/ML IJ SOLN
INTRAMUSCULAR | Status: DC | PRN
  Administered 2023-06-03: 3500 [IU] via INTRAVENOUS

## 2023-06-03 MED ORDER — SODIUM CHLORIDE 0.9% FLUSH: 3.0000 mL | Freq: Two times a day (BID) | INTRAVENOUS | Status: DC

## 2023-06-03 MED ORDER — CEFAZOLIN SODIUM-DEXTROSE 2-4 GM/100ML-% IV SOLN
INTRAVENOUS | Status: AC
  Filled 2023-06-03: qty 100

## 2023-06-03 MED ORDER — LABETALOL HCL 5 MG/ML IV SOLN: 10.0000 mg | INTRAVENOUS | Status: DC | PRN

## 2023-06-03 MED ORDER — SODIUM CHLORIDE 0.9% FLUSH: 3.0000 mL | INTRAVENOUS | Status: DC | PRN

## 2023-06-03 MED ORDER — ACETAMINOPHEN 325 MG PO TABS
650.0000 mg | ORAL_TABLET | ORAL | Status: DC | PRN
Start: 2023-06-03 — End: 2023-06-03

## 2023-06-03 MED ORDER — FAMOTIDINE 20 MG PO TABS: 40.0000 mg | ORAL_TABLET | Freq: Once | ORAL | Status: DC | PRN

## 2023-06-03 MED ORDER — HYDRALAZINE HCL 20 MG/ML IJ SOLN: 5.0000 mg | INTRAMUSCULAR | Status: DC | PRN

## 2023-06-03 MED ORDER — SODIUM CHLORIDE 0.9 % IV SOLN: INTRAVENOUS | Status: DC

## 2023-06-03 MED ORDER — ONDANSETRON HCL 4 MG/2ML IJ SOLN: 4.0000 mg | Freq: Four times a day (QID) | INTRAMUSCULAR | Status: DC | PRN

## 2023-06-03 MED ORDER — METHYLPREDNISOLONE SODIUM SUCC 125 MG IJ SOLR: 125.0000 mg | Freq: Once | INTRAMUSCULAR | Status: DC | PRN

## 2023-06-03 MED ORDER — ASPIRIN 81 MG PO TBEC: 81.0000 mg | DELAYED_RELEASE_TABLET | Freq: Every day | ORAL | 2 refills | Status: AC

## 2023-06-03 MED ORDER — ASPIRIN 81 MG PO TBEC
81.0000 mg | DELAYED_RELEASE_TABLET | Freq: Every day | ORAL | Status: DC
  Administered 2023-06-03: 81 mg via ORAL

## 2023-06-03 MED ORDER — MIDAZOLAM HCL 2 MG/2ML IJ SOLN
INTRAMUSCULAR | Status: DC | PRN
  Administered 2023-06-03: 1 mg via INTRAVENOUS

## 2023-06-03 MED ORDER — MIDAZOLAM HCL 5 MG/5ML IJ SOLN
INTRAMUSCULAR | Status: AC
  Filled 2023-06-03: qty 5

## 2023-06-03 MED ORDER — HEPARIN SODIUM (PORCINE) 1000 UNIT/ML IJ SOLN
INTRAMUSCULAR | Status: AC
  Filled 2023-06-03: qty 10

## 2023-06-03 MED ORDER — LIDOCAINE-EPINEPHRINE (PF) 1 %-1:200000 IJ SOLN
INTRAMUSCULAR | Status: DC | PRN
  Administered 2023-06-03: 10 mL

## 2023-06-03 MED ORDER — HEPARIN (PORCINE) IN NACL 1000-0.9 UT/500ML-% IV SOLN
INTRAVENOUS | Status: DC | PRN
  Administered 2023-06-03: 1000 mL

## 2023-06-03 MED ORDER — ASPIRIN 81 MG PO TBEC
DELAYED_RELEASE_TABLET | ORAL | Status: AC
  Filled 2023-06-03: qty 1

## 2023-06-03 MED ORDER — CEFAZOLIN SODIUM-DEXTROSE 2-4 GM/100ML-% IV SOLN
2.0000 g | INTRAVENOUS | Status: AC
  Administered 2023-06-03: 2 g via INTRAVENOUS

## 2023-06-03 MED ORDER — DIPHENHYDRAMINE HCL 50 MG/ML IJ SOLN: 50.0000 mg | Freq: Once | INTRAMUSCULAR | Status: DC | PRN

## 2023-06-03 MED ORDER — FENTANYL CITRATE (PF) 100 MCG/2ML IJ SOLN
INTRAMUSCULAR | Status: DC | PRN
  Administered 2023-06-03: 25 ug via INTRAVENOUS

## 2023-06-03 MED ORDER — FENTANYL CITRATE (PF) 100 MCG/2ML IJ SOLN
INTRAMUSCULAR | Status: AC
  Filled 2023-06-03: qty 2

## 2023-06-03 SURGICAL SUPPLY — 21 items
BALLN LUTONIX 018 4X100X130 (BALLOONS) ×1
BALLN LUTONIX 018 5X150X130 (BALLOONS) ×1
BALLN LUTONIX 018 6X40X130 (BALLOONS) ×1
BALLOON LUTONIX 018 4X100X130 (BALLOONS) IMPLANT
BALLOON LUTONIX 018 5X150X130 (BALLOONS) IMPLANT
BALLOON LUTONIX 018 6X40X130 (BALLOONS) IMPLANT
CATH ANGIO 5F PIGTAIL 65CM (CATHETERS) IMPLANT
CATH BEACON 5 .038 100 VERT TP (CATHETERS) IMPLANT
CATH ROTAREX 135 6FR (CATHETERS) IMPLANT
COVER PROBE ULTRASOUND 5X96 (MISCELLANEOUS) IMPLANT
DEVICE STARCLOSE SE CLOSURE (Vascular Products) IMPLANT
GLIDEWIRE ADV .035X260CM (WIRE) IMPLANT
KIT ENCORE 26 ADVANTAGE (KITS) IMPLANT
PACK ANGIOGRAPHY (CUSTOM PROCEDURE TRAY) ×1 IMPLANT
SHEATH ANL2 6FRX45 HC (SHEATH) IMPLANT
SHEATH BRITE TIP 5FRX11 (SHEATH) IMPLANT
STENT VIABAHN 6X100X120 (Permanent Stent) IMPLANT
SYR MEDRAD MARK 7 150ML (SYRINGE) IMPLANT
TUBING CONTRAST HIGH PRESS 72 (TUBING) IMPLANT
WIRE G V18X300CM (WIRE) IMPLANT
WIRE GUIDERIGHT .035X150 (WIRE) IMPLANT

## 2023-06-03 NOTE — Op Note (Signed)
[facility]  Percutaneous Study/Intervention Procedural Note   Date of Surgery: 06/03/2023  Surgeon(s):Wadell Craddock    Assistants:none  Pre-operative Diagnosis: PAD with rest pain LLE  Post-operative diagnosis:  Same  Procedure(s) Performed:             1.  Ultrasound guidance for vascular access right femoral artery             2.  Catheter placement into left common femoral artery from right femoral approach             3.  Aortogram and selective left lower extremity angiogram             4.  Percutaneous transluminal angioplasty of left external iliac artery with 6 mm diameter by 4 cm length Lutonix drug-coated angioplasty balloon             5.  Mechanical thrombectomy of the left SFA and popliteal arteries with the [place] device  6.  Percutaneous transluminal angioplasty of left mid popliteal artery with 4 mm diameter by 10 cm length Lutonix drug-coated angioplasty balloon  7.  Stent placement to the left SFA and most proximal popliteal artery with 6 mm diameter by 10 cm length Viabahn stent             8.  StarClose closure device right femoral artery  EBL: 50 cc  Contrast: 60 cc  Fluoro Time: 5.9 minutes  Moderate Conscious Sedation Time: approximately 48 minutes using 1 mg of Versed and 25 mcg of Fentanyl              Indications:  Patient is a 89 y.o.female with recurrent ischemic rest pain of the left lower extremity. The patient has noninvasive study showing a markedly reduced left ABI with external iliac artery stenosis and likely thrombosis of her previous SFA/popliteal interventions. The patient is brought in for angiography for further evaluation and potential treatment.  Due to the limb threatening nature of the situation, angiogram was performed for attempted limb salvage. The patient is aware that if the procedure fails, amputation would be expected.  The patient also understands that even with successful revascularization, amputation may  still be required due to the severity of the situation.  Risks and benefits are discussed and informed consent is obtained.   Procedure:  The patient was identified and appropriate procedural time out was performed.  The patient was then placed supine on the table and prepped and draped in the usual sterile fashion. Moderate conscious sedation was administered during a face to face encounter with the patient throughout the procedure with my supervision of the RN administering medicines and monitoring the patient's vital signs, pulse oximetry, telemetry and mental status throughout from the start of the procedure until the patient was taken to the recovery room. Ultrasound was used to evaluate the right common femoral artery.  It was patent .  A digital ultrasound image was acquired.  A Seldinger needle was used to access the right common femoral artery under direct ultrasound guidance and a permanent image was performed.  A 0.035 J wire was advanced without resistance and a 5Fr sheath was placed.  Pigtail catheter was placed into the aorta and an AP aortogram was performed. This demonstrated normal renal arteries and normal aorta.  The right common iliac artery had been stented previously and had a borderline 50% stenosis at the leading edge of the stent in the right common iliac artery.  The remainder of the  right iliac system including the stent were patent.  The left common iliac artery also had a borderline 50% stenosis proximally.  The left hypogastric artery was extremely diminutive.  The distal left external iliac artery had about a 90% stenosis. I then crossed the aortic bifurcation and advanced to the left femoral head. Selective left lower extremity angiogram was then performed. This demonstrated a nearly occlusive stenosis of the profunda femoris artery proximally.  The external iliac artery lesion tracked down to the common femoral artery but the SFA and the proximal and mid segments were patent.  The  distal SFA was thrombosed above and including the previously placed stent that went into the popliteal artery.  There was reconstitution of a small below-knee popliteal artery with three-vessel runoff distally although the vessels were extremely small and the peroneal artery appeared to be the best runoff distally. It was felt that it was in the patient's best interest to proceed with intervention after these images to avoid a second procedure and a larger amount of contrast and fluoroscopy based off of the findings from the initial angiogram. The patient was systemically heparinized and a 6 [place] sheath was then placed over the [employer] wire. I then used a Kumpe catheter and the advantage wire to navigate through the thrombosed SFA and popliteal arteries without difficulty and confirm intraluminal flow in the below-knee popliteal artery before exchanging for a V18 wire.  I then proceeded with mechanical thrombectomy to debulk the chronic thrombus from the left SFA and popliteal arteries.  This included the previously placed stents as well as about 8 to 10 cm above the stent and just below the stent as well.  Following thrombectomy, thrombus was removed and there was now flow channel, but there remained a high-grade stenosis distal to the stents as well as diffuse disease at the top of the stent tracking up about 6 to 8 cm creating greater than 80% stenosis.  For the popliteal artery just below the stent a 4 mm diameter by 10 cm length Lutonix drug-coated angioplasty balloon was inflated to 8 atm for 1 minute.  Completion imaging showed less than 10% residual stenosis in the below-knee popliteal artery.  I elected to primarily stent the top portion as this was much more calcified and there had been thrombus prior to thrombectomy, and elected to take a 6 mm diameter by 10 cm length Viabahn stent in the distal SFA and most proximal popliteal artery.  This was postdilated with a 5 mm diameter Lutonix  drug-coated angioplasty balloon with excellent angiographic completion result and less than 10% residual stenosis.  The sheath was then pulled back to the proximal to mid left external iliac artery and angioplasty was performed of the left external iliac artery with a 6 mm diameter by 4 cm length Lutonix drug-coated angioplasty balloon inflated to 10 atm for 1 minute.  Completion imaging showed a marked improvement with about a 15 to 20% residual stenosis.  The borderline significant common iliac lesions were not treated today and if she has continued symptoms, we would consider coming back from bilateral femoral approaches and placing kissing stents into the distal aorta and both common iliac arteries. I elected to terminate the procedure. The sheath was removed and StarClose closure device was deployed in the right femoral artery with excellent hemostatic result. The patient was taken to the recovery room in stable condition having tolerated the procedure well.  Findings:  Aortogram:  This demonstrated normal renal arteries and normal aorta.  The right common iliac artery had been stented previously and had a borderline 50% stenosis at the leading edge of the stent in the right common iliac artery.  The remainder of the right iliac system including the stent were patent.  The left common iliac artery also had a borderline 50% stenosis proximally.  The left hypogastric artery was extremely diminutive.  The distal left external iliac artery had about a 90% stenosis.             Left lower Extremity:  This demonstrated a nearly occlusive stenosis of the profunda femoris artery proximally.  The external iliac artery lesion tracked down to the common femoral artery but the SFA and the proximal and mid segments were patent.  The distal SFA was thrombosed above and including the previously placed stent that went into the popliteal artery.  There was reconstitution of a small below-knee popliteal artery  with three-vessel runoff distally although the vessels were extremely small and the peroneal artery appeared to be the best runoff distally.   Disposition: Patient was taken to the recovery room in stable condition having tolerated the procedure well.  Complications: None  Festus Barren 06/03/2023 9:48 AM   This note was created with Dragon Medical transcription system. Any errors in dictation are purely unintentional.

## 2023-06-03 NOTE — Interval H&P Note (Signed)
History and Physical Interval Note:  06/03/2023 8:05 AM  Misty Ferguson  has presented today for surgery, with the diagnosis of LLE Angio   ASO w claudication.  The various methods of treatment have been discussed with the patient and family. After consideration of risks, benefits and other options for treatment, the patient has consented to  Procedure(s): Lower Extremity Angiography (Left) as a surgical intervention.  The patient's history has been reviewed, patient examined, no change in status, stable for surgery.  I have reviewed the patient's chart and labs.  Questions were answered to the patient's satisfaction.     Festus Barren

## 2023-06-03 NOTE — Progress Notes (Signed)
Dr. Wyn Quaker at bedside, speaking with pt. And her husband re: procedural results. Both verbalized understanding of conversation and f/u in one month at MD office.

## 2023-06-05 ENCOUNTER — Ambulatory Visit: Payer: Medicare HMO | Admitting: Internal Medicine

## 2023-06-05 ENCOUNTER — Encounter: Payer: Self-pay | Admitting: Internal Medicine

## 2023-06-05 ENCOUNTER — Ambulatory Visit (INDEPENDENT_AMBULATORY_CARE_PROVIDER_SITE_OTHER): Payer: Medicare HMO | Admitting: Nurse Practitioner

## 2023-06-05 ENCOUNTER — Encounter (INDEPENDENT_AMBULATORY_CARE_PROVIDER_SITE_OTHER): Payer: Self-pay | Admitting: Nurse Practitioner

## 2023-06-05 ENCOUNTER — Encounter (INDEPENDENT_AMBULATORY_CARE_PROVIDER_SITE_OTHER): Payer: Self-pay

## 2023-06-05 VITALS — BP 132/60 | HR 81 | Temp 97.6°F | Ht 60.0 in | Wt 90.0 lb

## 2023-06-05 VITALS — BP 157/77 | HR 92

## 2023-06-05 DIAGNOSIS — I70212 Atherosclerosis of native arteries of extremities with intermittent claudication, left leg: Secondary | ICD-10-CM | POA: Diagnosis not present

## 2023-06-05 DIAGNOSIS — J9611 Chronic respiratory failure with hypoxia: Secondary | ICD-10-CM

## 2023-06-05 DIAGNOSIS — J449 Chronic obstructive pulmonary disease, unspecified: Secondary | ICD-10-CM | POA: Diagnosis not present

## 2023-06-05 DIAGNOSIS — E7800 Pure hypercholesterolemia, unspecified: Secondary | ICD-10-CM | POA: Diagnosis not present

## 2023-06-05 DIAGNOSIS — R0689 Other abnormalities of breathing: Secondary | ICD-10-CM

## 2023-06-05 MED ORDER — TRELEGY ELLIPTA 200-62.5-25 MCG/ACT IN AEPB: 1.0000 | INHALATION_SPRAY | Freq: Every day | RESPIRATORY_TRACT | 5 refills | Status: DC

## 2023-06-05 MED ORDER — ALBUTEROL SULFATE (2.5 MG/3ML) 0.083% IN NEBU: 2.5000 mg | INHALATION_SOLUTION | RESPIRATORY_TRACT | 12 refills | Status: DC | PRN

## 2023-06-05 NOTE — Progress Notes (Signed)
Subjective:    Patient ID: Misty Ferguson, female    DOB: [birth year], 89 y.o.   MRN: [id number] Chief Complaint  Patient presents with   Follow-up    ADD ON PER FB    The patient underwent angiogram on her left lower extremity on 06/05/2023 and subsequently has been having some oozing from her right groin.  There has not been significant blood flow but just a constant oozing.  She also endorses having some pain in the left foot as well.  It is notably swollen today.    Review of Systems  Cardiovascular:  Positive for leg swelling.  Hematological:  Bruises/bleeds easily.  All other systems reviewed and are negative.      Objective:   Physical Exam Vitals reviewed.  HENT:     Head: Normocephalic.  Cardiovascular:     Rate and Rhythm: Normal rate.     Pulses:          Dorsalis pedis pulses are detected w/ Doppler on the left side.       Posterior tibial pulses are detected w/ Doppler on the left side.  Pulmonary:     Effort: Pulmonary effort is normal.  Musculoskeletal:     Left lower leg: Edema present.  Skin:    General: Skin is warm and dry.  Neurological:     Mental Status: She is alert and oriented to person, place, and time.  Psychiatric:        Mood and Affect: Mood normal.        Behavior: Behavior normal.        Thought Content: Thought content normal.        Judgment: Judgment normal.     BP (!) 157/77 (BP Location: Left Arm)   Pulse 92   Past Medical History:  Diagnosis Date   AK (actinic keratosis) 12/08/2020   right pretibia inferior bx proven, LN2 01/10/21   Burping    Chronic airway obstruction, not elsewhere classified    Dyspnea    Dysrhythmia    Macular degeneration (senile) of retina, unspecified    Malignant neoplasm of urinary bladder (HCC) 03/2019   Partial bladder resection and Rad tx's.    Obstructive chronic bronchitis with exacerbation (HCC)    Osteoporosis, unspecified    Oxygen deficiency    Personal history of peptic ulcer  disease    SCC (squamous cell carcinoma) 12/08/2020   right pretibia superior, EDC 01/10/21   SCC (squamous cell carcinoma) 07/03/2022   left medial calf, clear 09/25/22   SCC (squamous cell carcinoma) 11/22/2022   right thigh   treated with ED&C   Squamous cell carcinoma in situ (SCCIS) 07/03/2022   right pretibia sccis needs treatment with fluorouracil   Tobacco use disorder    Unspecified essential hypertension    Unspecified glaucoma(365.9)     Social History   Socioeconomic History   Marital status: Married    Spouse name: [name]   Number of children: 3   Years of education: Not on file   Highest education level: Not on file  Occupational History   Occupation: RETIRED    Employer: RETIRED  Tobacco Use   Smoking status: Former    Current packs/day: 0.00    Average packs/day: 1.3 packs/day for 45.0 years (58.5 ttl pk-yrs)    Types: Cigarettes    Start date: 26    Quit date: 1998    Years since quitting: 26.8    Passive exposure: Past   Smokeless tobacco:  WBC 4.0 - 10.5 K/uL 3.1  9.7  5.1   Hemoglobin 12.0 - 15.0 g/dL 9.3  40.9  81.1   Hematocrit 36.0 - 46.0 % 28.8  39.7  38.4   Platelets 150 - 400 K/uL 167  287  217.0       CMP     Component Value Date/Time   NA 141 11/06/2022 0756   NA 141 06/10/2019 1324   K 4.1 11/06/2022 0756   K 4.1 07/23/2012 1536   CL 102 11/06/2022 0756   CO2 30 11/06/2022 0756   GLUCOSE 117 (H) 11/06/2022 0756   BUN 17 11/06/2022 0756   BUN 15 06/10/2019 1324   CREATININE 0.71 06/03/2023 0803   CREATININE 1.26 (H) 12/23/2012 1440   CALCIUM 9.5 11/06/2022 0756   PROT 6.5 11/06/2022 0756   PROT 6.6 06/10/2019 1324   ALBUMIN 4.4 11/06/2022 0756   ALBUMIN 4.5 06/10/2019 1324   AST 20 11/06/2022 0756   ALT 21 11/06/2022 0756   ALKPHOS 57 11/06/2022 0756   BILITOT 0.4 11/06/2022 0756   BILITOT 0.4 06/10/2019 1324   GFR 65.72 11/06/2022 0756   GFRNONAA >60 06/03/2023 0803     VAS [place] ABI WITH/WO TBI  Result Date: 05/27/2023  LOWER EXTREMITY DOPPLER STUDY Patient Name:  Misty Ferguson  Date of Exam:   05/20/2023 Medical Rec #: [id number]       Accession #:    [id number] Date of Birth: [birth year]      Patient Gender: F Patient Age:   16 years Exam Location:  [facility] Procedure:      VAS [place] ABI WITH/WO TBI Referring Phys: Barbara Cower DEW --------------------------------------------------------------------------------  Indications: Peripheral artery disease, and Decreased pulses; C/O bilateral heel              pain (R>L). High Risk Factors: Hypertension, past history of smoking.  Vascular Interventions: 11/20/2021: Rt EIA/CIA Stent PTA RT SFA.                           09/03/2022: Aortogram and Selective Left Lower Extremity                         Angiogram. PTA of Left SFA and Popliteal Artery with 5                         mm x 22 cm length Lutonix drug coated balloon. Stent                         placement with 6 mm x 6 cm and 6 mm x 4 cm Lifestents to                         the distal SFA and above knee Popliteal Artery. PTA of                         Left EIA wit 6 mm diameter 4 cm length Lutonix drug                         coated angioplasty balloon. Performing Technologist: Hardie Lora RVT  Examination Guidelines: A complete evaluation includes at minimum, Doppler waveform signals and systolic blood pressure reading at the level of bilateral brachial, anterior tibial, and posterior  Subjective:    Patient ID: Misty Ferguson, female    DOB: [birth year], 89 y.o.   MRN: [id number] Chief Complaint  Patient presents with   Follow-up    ADD ON PER FB    The patient underwent angiogram on her left lower extremity on 06/05/2023 and subsequently has been having some oozing from her right groin.  There has not been significant blood flow but just a constant oozing.  She also endorses having some pain in the left foot as well.  It is notably swollen today.    Review of Systems  Cardiovascular:  Positive for leg swelling.  Hematological:  Bruises/bleeds easily.  All other systems reviewed and are negative.      Objective:   Physical Exam Vitals reviewed.  HENT:     Head: Normocephalic.  Cardiovascular:     Rate and Rhythm: Normal rate.     Pulses:          Dorsalis pedis pulses are detected w/ Doppler on the left side.       Posterior tibial pulses are detected w/ Doppler on the left side.  Pulmonary:     Effort: Pulmonary effort is normal.  Musculoskeletal:     Left lower leg: Edema present.  Skin:    General: Skin is warm and dry.  Neurological:     Mental Status: She is alert and oriented to person, place, and time.  Psychiatric:        Mood and Affect: Mood normal.        Behavior: Behavior normal.        Thought Content: Thought content normal.        Judgment: Judgment normal.     BP (!) 157/77 (BP Location: Left Arm)   Pulse 92   Past Medical History:  Diagnosis Date   AK (actinic keratosis) 12/08/2020   right pretibia inferior bx proven, LN2 01/10/21   Burping    Chronic airway obstruction, not elsewhere classified    Dyspnea    Dysrhythmia    Macular degeneration (senile) of retina, unspecified    Malignant neoplasm of urinary bladder (HCC) 03/2019   Partial bladder resection and Rad tx's.    Obstructive chronic bronchitis with exacerbation (HCC)    Osteoporosis, unspecified    Oxygen deficiency    Personal history of peptic ulcer  disease    SCC (squamous cell carcinoma) 12/08/2020   right pretibia superior, EDC 01/10/21   SCC (squamous cell carcinoma) 07/03/2022   left medial calf, clear 09/25/22   SCC (squamous cell carcinoma) 11/22/2022   right thigh   treated with ED&C   Squamous cell carcinoma in situ (SCCIS) 07/03/2022   right pretibia sccis needs treatment with fluorouracil   Tobacco use disorder    Unspecified essential hypertension    Unspecified glaucoma(365.9)     Social History   Socioeconomic History   Marital status: Married    Spouse name: [name]   Number of children: 3   Years of education: Not on file   Highest education level: Not on file  Occupational History   Occupation: RETIRED    Employer: RETIRED  Tobacco Use   Smoking status: Former    Current packs/day: 0.00    Average packs/day: 1.3 packs/day for 45.0 years (58.5 ttl pk-yrs)    Types: Cigarettes    Start date: 26    Quit date: 1998    Years since quitting: 26.8    Passive exposure: Past   Smokeless tobacco:  Subjective:    Patient ID: Misty Ferguson, female    DOB: [birth year], 89 y.o.   MRN: [id number] Chief Complaint  Patient presents with   Follow-up    ADD ON PER FB    The patient underwent angiogram on her left lower extremity on 06/05/2023 and subsequently has been having some oozing from her right groin.  There has not been significant blood flow but just a constant oozing.  She also endorses having some pain in the left foot as well.  It is notably swollen today.    Review of Systems  Cardiovascular:  Positive for leg swelling.  Hematological:  Bruises/bleeds easily.  All other systems reviewed and are negative.      Objective:   Physical Exam Vitals reviewed.  HENT:     Head: Normocephalic.  Cardiovascular:     Rate and Rhythm: Normal rate.     Pulses:          Dorsalis pedis pulses are detected w/ Doppler on the left side.       Posterior tibial pulses are detected w/ Doppler on the left side.  Pulmonary:     Effort: Pulmonary effort is normal.  Musculoskeletal:     Left lower leg: Edema present.  Skin:    General: Skin is warm and dry.  Neurological:     Mental Status: She is alert and oriented to person, place, and time.  Psychiatric:        Mood and Affect: Mood normal.        Behavior: Behavior normal.        Thought Content: Thought content normal.        Judgment: Judgment normal.     BP (!) 157/77 (BP Location: Left Arm)   Pulse 92   Past Medical History:  Diagnosis Date   AK (actinic keratosis) 12/08/2020   right pretibia inferior bx proven, LN2 01/10/21   Burping    Chronic airway obstruction, not elsewhere classified    Dyspnea    Dysrhythmia    Macular degeneration (senile) of retina, unspecified    Malignant neoplasm of urinary bladder (HCC) 03/2019   Partial bladder resection and Rad tx's.    Obstructive chronic bronchitis with exacerbation (HCC)    Osteoporosis, unspecified    Oxygen deficiency    Personal history of peptic ulcer  disease    SCC (squamous cell carcinoma) 12/08/2020   right pretibia superior, EDC 01/10/21   SCC (squamous cell carcinoma) 07/03/2022   left medial calf, clear 09/25/22   SCC (squamous cell carcinoma) 11/22/2022   right thigh   treated with ED&C   Squamous cell carcinoma in situ (SCCIS) 07/03/2022   right pretibia sccis needs treatment with fluorouracil   Tobacco use disorder    Unspecified essential hypertension    Unspecified glaucoma(365.9)     Social History   Socioeconomic History   Marital status: Married    Spouse name: [name]   Number of children: 3   Years of education: Not on file   Highest education level: Not on file  Occupational History   Occupation: RETIRED    Employer: RETIRED  Tobacco Use   Smoking status: Former    Current packs/day: 0.00    Average packs/day: 1.3 packs/day for 45.0 years (58.5 ttl pk-yrs)    Types: Cigarettes    Start date: 26    Quit date: 1998    Years since quitting: 26.8    Passive exposure: Past   Smokeless tobacco:  WBC 4.0 - 10.5 K/uL 3.1  9.7  5.1   Hemoglobin 12.0 - 15.0 g/dL 9.3  40.9  81.1   Hematocrit 36.0 - 46.0 % 28.8  39.7  38.4   Platelets 150 - 400 K/uL 167  287  217.0       CMP     Component Value Date/Time   NA 141 11/06/2022 0756   NA 141 06/10/2019 1324   K 4.1 11/06/2022 0756   K 4.1 07/23/2012 1536   CL 102 11/06/2022 0756   CO2 30 11/06/2022 0756   GLUCOSE 117 (H) 11/06/2022 0756   BUN 17 11/06/2022 0756   BUN 15 06/10/2019 1324   CREATININE 0.71 06/03/2023 0803   CREATININE 1.26 (H) 12/23/2012 1440   CALCIUM 9.5 11/06/2022 0756   PROT 6.5 11/06/2022 0756   PROT 6.6 06/10/2019 1324   ALBUMIN 4.4 11/06/2022 0756   ALBUMIN 4.5 06/10/2019 1324   AST 20 11/06/2022 0756   ALT 21 11/06/2022 0756   ALKPHOS 57 11/06/2022 0756   BILITOT 0.4 11/06/2022 0756   BILITOT 0.4 06/10/2019 1324   GFR 65.72 11/06/2022 0756   GFRNONAA >60 06/03/2023 0803     VAS [place] ABI WITH/WO TBI  Result Date: 05/27/2023  LOWER EXTREMITY DOPPLER STUDY Patient Name:  Misty Ferguson  Date of Exam:   05/20/2023 Medical Rec #: [id number]       Accession #:    [id number] Date of Birth: [birth year]      Patient Gender: F Patient Age:   16 years Exam Location:  [facility] Procedure:      VAS [place] ABI WITH/WO TBI Referring Phys: Barbara Cower DEW --------------------------------------------------------------------------------  Indications: Peripheral artery disease, and Decreased pulses; C/O bilateral heel              pain (R>L). High Risk Factors: Hypertension, past history of smoking.  Vascular Interventions: 11/20/2021: Rt EIA/CIA Stent PTA RT SFA.                           09/03/2022: Aortogram and Selective Left Lower Extremity                         Angiogram. PTA of Left SFA and Popliteal Artery with 5                         mm x 22 cm length Lutonix drug coated balloon. Stent                         placement with 6 mm x 6 cm and 6 mm x 4 cm Lifestents to                         the distal SFA and above knee Popliteal Artery. PTA of                         Left EIA wit 6 mm diameter 4 cm length Lutonix drug                         coated angioplasty balloon. Performing Technologist: Hardie Lora RVT  Examination Guidelines: A complete evaluation includes at minimum, Doppler waveform signals and systolic blood pressure reading at the level of bilateral brachial, anterior tibial, and posterior

## 2023-06-05 NOTE — Patient Instructions (Addendum)
Continue inhalers as prescribed Continue oxygen as prescribed  Avoid secondhand smoke Avoid SICK contacts Recommend  Masking  when appropriate Recommend Keep up-to-date with vaccinations  Lets start Trelegy 200 And albuterol nebulizers every 4-6 hours as needed Use flutter valve after nebulizer therapy  Follow-up vascular surgery for assessment of femoral site

## 2023-06-05 NOTE — Telephone Encounter (Signed)
See if she can come in today and I can patch it

## 2023-06-05 NOTE — Progress Notes (Signed)
[facility] Pulmonary Medicine Consultation     Date: 06/05/2023,   MRN# [id number] Misty Ferguson Feb 15, 1934      Synopsis: Misty Ferguson was first seen by the [facility] pulmonary clinic in the summer of 2013. She has COPD. Simple spirometry performed in clinic showed an FEV1 of 45% predicted with clear obstruction.  She smoked 2 pack/day x 40 years, quit  17 years ago according to patient. She is a frequent exacerbation phenotype. She uses 2 L of oxygen continuously with exertion and at night   lling, mild erythema CC  Follow up COPD Assessment of chronic hypoxic resp failure Send vascular surgery for PAD  HPI  No exacerbation at this time No evidence of heart failure at this time No evidence or signs of infection at this time No respiratory distress No fevers, chills, nausea, vomiting, diarrhea No evidence of lower extremity edema No evidence hemoptysis No need for antibiotics at this time  Diagnosed with COVID July 2022 Declining ever since then  Recent VASC procedure  Continues to use oxygen therapy 24/7 Needs this for survival   Patient diagnosed with bladder cancer sees urology   Chronic shortness of breath and dyspnea exertion seems to be stable over the last several years Patient little lethargic and weak today due to recent vascular surgery Recommend using albuterol nebs and flutter valve Follow-up with vascular surgery office for assessment      o signs of CHF or lower ext swelling at t ssues, has chCurrent Medication:   Current Outpatient Medications:    albuterol (VENTOLIN HFA) 108 (90 Base) MCG/ACT inhaler, Inhale 2 puffs into the lungs every 6 (six) hours as needed for wheezing or shortness of breath., Disp: 8 g, Rfl: 0   ascorbic acid (VITAMIN C) 1000 MG tablet, Take 1,000 mg by mouth daily. , Disp: , Rfl:    aspirin EC 81 MG tablet, Take 1 tablet (81 mg total) by mouth daily. Swallow whole., Disp: 150 tablet, Rfl: 2   atorvastatin  (LIPITOR) 10 MG tablet, TAKE 1 TABLET EVERY DAY, Disp: 90 tablet, Rfl: 3   Azelastine HCl 137 MCG/SPRAY SOLN, PLACE 2 SPRAYS INTO BOTH NOSTRILS 2 (TWO) TIMES DAILY. USE IN EACH NOSTRIL AS DIRECTED, Disp: 100 mL, Rfl: 5   budesonide-formoterol (SYMBICORT) 160-4.5 MCG/ACT inhaler, Inhale 2 puffs into the lungs in the morning and at bedtime., Disp: 3 each, Rfl: 0   Cholecalciferol (VITAMIN D) 125 MCG (5000 UT) CAPS, Take 5,000 Units by mouth daily., Disp: , Rfl:    clopidogrel (PLAVIX) 75 MG tablet, TAKE 1 TABLET EVERY DAY, Disp: 90 tablet, Rfl: 3   docusate sodium (COLACE) 100 MG capsule, Take 100 mg by mouth 2 (two) times daily., Disp: , Rfl:    guaiFENesin (MUCINEX) 600 MG 12 hr tablet, Take 600 mg by mouth 2 (two) times daily., Disp: , Rfl:    losartan (COZAAR) 25 MG tablet, 25 mg daily. , Disp: , Rfl:    Multiple Vitamins-Minerals (PRESERVISION AREDS) TABS, Take 1 tablet by mouth 2 (two) times daily., Disp: , Rfl:    OXYGEN, Inhale 4 L into the lungs as needed., Disp: , Rfl:    verapamil (VERELAN PM) 360 MG 24 hr capsule, Take 360 mg by mouth daily., Disp: , Rfl:    Vitamin E 180 MG CAPS, Take 180 mg by mouth daily., Disp: , Rfl:      ALLERGIES   Patient has no known allergies.  BP 132/60 (BP Location: Left Arm, Patient Position: Sitting,  Cuff Size: Normal)   Pulse 81   Temp 97.6 F (36.4 C) (Temporal)   Ht 5' (1.524 m)   Wt 90 lb (40.8 kg)   SpO2 91%   BMI 17.58 kg/m    Review of Systems: Gen:  Denies  fever, sweats, chills weight loss  HEENT: Denies blurred vision, double vision, ear pain, eye pain, hearing loss, nose bleeds, sore throat Cardiac:  No dizziness, chest pain or heaviness, chest tightness,edema, No JVD Resp:   No cough, -sputum production, -shortness of breath,-wheezing, -hemoptysis,  Other:  All other systems negative   Physical Examination:   General Appearance: No distress thin frail cachectic EYES PERRLA, EOM intact.   NECK Supple, No JVD Pulmonary:  normal breath sounds, +rhonchi CardiovascularNormal S1,S2.  No m/r/g.   Abdomen: Benign, Soft, non-tender. Neurology UE/LE 5/5 strength, no focal deficits Ext pulses intact, cap refill intact ALL OTHER ROS ARE NEGATIVE           ASSESSMENT/PLAN   89 year old pleasant white female seen today for follow-up assessment for COPD Gold stage D end-stage severe COPD with chronic hypoxic respiratory failure with CT finding suggestive of severe emphysema and bronchiectasis with right lower lobe pleural-based nodule with excessive daytime sleepiness findings may suggest underlying sleep apnea but does not want to go any further sleep apnea testing nor does she want any further imaging of her lung nodule with CT scans    SEVERE COPD No exacerbation at this time Patient was started on nebulized therapy now switch back to [employer] no longer covering Symbicort will try Trelegy 200 Use flutter valve as tolerated recommend using after nebulizer therapy with albuterol   SEVERE RESP INEFFICIENCY CONTINUE INHALERS AND OXYGEN AS PRESCRIBED Recommend flutter valve as tolerated  Patient is at a very high risk for pulmonary complications if she were to undergo any type of procedure With recent vascular surgery procedure for PAD Similar vascular site is bleeding recommend follow-up Vasc surgery office Message sent to Dr. Wyn Quaker   CT chest ABNORMAL Right pleural-based nodule based on CT scan findings Patient does not want repeat CT chest to assess interval changes She does not want to pursue any further diagnostic testing She is happy with her life and does not want any type of work-up for this nodule We addressed repeating CT scans at this time however patient does not want any  further intervention or work-up  Chronic Hypoxic resp failure due to COPD -Patient benefits from oxygen therapy 2L [place]  -recommend using oxygen as prescribed -patient needs this for  survival    MEDICATION ADJUSTMENTS/LABS AND TESTS ORDERED: Avoid secondhand smoke Avoid SICK contacts Recommend  Masking  when appropriate Recommend Keep up-to-date with vaccinations Continue inhalers with Symbicort as prescribed Continue Oxygen as prescribed  Follow-up in 6 months  Total Time spent 41 mins    Misty Ferguson, M.D.  [facility]  Medical Director [facility] Medical Director [facility] Cardio-Pulmonary Department

## 2023-06-20 ENCOUNTER — Encounter (INDEPENDENT_AMBULATORY_CARE_PROVIDER_SITE_OTHER): Payer: Self-pay

## 2023-06-21 ENCOUNTER — Other Ambulatory Visit (INDEPENDENT_AMBULATORY_CARE_PROVIDER_SITE_OTHER): Payer: Self-pay | Admitting: Vascular Surgery

## 2023-06-21 DIAGNOSIS — Z9889 Other specified postprocedural states: Secondary | ICD-10-CM

## 2023-06-24 ENCOUNTER — Encounter (INDEPENDENT_AMBULATORY_CARE_PROVIDER_SITE_OTHER): Payer: Self-pay

## 2023-06-24 ENCOUNTER — Encounter (INDEPENDENT_AMBULATORY_CARE_PROVIDER_SITE_OTHER): Payer: Medicare HMO

## 2023-07-03 ENCOUNTER — Ambulatory Visit (INDEPENDENT_AMBULATORY_CARE_PROVIDER_SITE_OTHER): Payer: Medicare HMO | Admitting: Nurse Practitioner

## 2023-07-03 ENCOUNTER — Encounter (INDEPENDENT_AMBULATORY_CARE_PROVIDER_SITE_OTHER): Payer: Self-pay | Admitting: Nurse Practitioner

## 2023-07-03 ENCOUNTER — Ambulatory Visit (INDEPENDENT_AMBULATORY_CARE_PROVIDER_SITE_OTHER): Payer: Medicare HMO

## 2023-07-03 VITALS — BP 156/67 | HR 71 | Resp 18 | Ht 61.0 in | Wt 90.0 lb

## 2023-07-03 DIAGNOSIS — I739 Peripheral vascular disease, unspecified: Secondary | ICD-10-CM

## 2023-07-03 DIAGNOSIS — Z9889 Other specified postprocedural states: Secondary | ICD-10-CM

## 2023-07-03 DIAGNOSIS — I10 Essential (primary) hypertension: Secondary | ICD-10-CM | POA: Diagnosis not present

## 2023-07-03 DIAGNOSIS — E7800 Pure hypercholesterolemia, unspecified: Secondary | ICD-10-CM | POA: Diagnosis not present

## 2023-07-04 LAB — VAS US ABI WITH/WO TBI
Left ABI: 0.97
Right ABI: 0.97

## 2023-07-05 ENCOUNTER — Encounter (INDEPENDENT_AMBULATORY_CARE_PROVIDER_SITE_OTHER): Payer: Medicare HMO

## 2023-07-05 ENCOUNTER — Ambulatory Visit (INDEPENDENT_AMBULATORY_CARE_PROVIDER_SITE_OTHER): Payer: Medicare HMO | Admitting: Vascular Surgery

## 2023-07-08 NOTE — Progress Notes (Signed)
Subjective:    Patient ID: Misty Ferguson, female    DOB: 12/25/1933, 89 y.o.   MRN: [id number] Chief Complaint  Patient presents with   Follow-up    Follow up  in 4 weeks ABI    The patient returns to the office for followup and review status post angiogram with intervention on 06/03/2023.   Procedure: Procedure(s) Performed:             1.  Ultrasound guidance for vascular access right femoral artery             2.  Catheter placement into left common femoral artery from right femoral approach             3.  Aortogram and selective left lower extremity angiogram             4.  Percutaneous transluminal angioplasty of left external iliac artery with 6 mm diameter by 4 cm length Lutonix drug-coated angioplasty balloon             5.  Mechanical thrombectomy of the left SFA and popliteal arteries with the Rota Rex device             6.  Percutaneous transluminal angioplasty of left mid popliteal artery with 4 mm diameter by 10 cm length Lutonix drug-coated angioplasty balloon             7.  Stent placement to the left SFA and most proximal popliteal artery with 6 mm diameter by 10 cm length Viabahn stent             8.  StarClose closure device right femoral artery   The patient notes improvement in the lower extremity symptoms. No interval shortening of the patient's claudication distance or rest pain symptoms. No new ulcers or wounds have occurred since the last visit.  There have been no significant changes to the patient's overall health care.  No documented history of amaurosis fugax or recent TIA symptoms. There are no recent neurological changes noted. No documented history of DVT, PE or superficial thrombophlebitis. The patient denies recent episodes of angina or shortness of breath.   ABI's Rt=0.97 and Lt=0.97  (previous ABI's Rt=0.89 and Lt=0.45) Duplex US of the bilateral tibial arteries show triphasic waveforms with good toe waveforms bilaterally    Review of  Systems  Cardiovascular:  Positive for leg swelling.  Neurological:  Positive for weakness.  All other systems reviewed and are negative.      Objective:   Physical Exam Vitals reviewed.  HENT:     Head: Normocephalic.  Cardiovascular:     Rate and Rhythm: Normal rate.     Pulses:          Dorsalis pedis pulses are detected w/ Doppler on the right side and detected w/ Doppler on the left side.       Posterior tibial pulses are detected w/ Doppler on the right side and detected w/ Doppler on the left side.  Pulmonary:     Effort: Pulmonary effort is normal.  Musculoskeletal:     Left lower leg: Edema present.  Skin:    General: Skin is warm and dry.  Neurological:     Mental Status: She is alert and oriented to person, place, and time.     Motor: Weakness present.  Psychiatric:        Mood and Affect: Mood normal.        Behavior: Behavior normal.  Thought Content: Thought content normal.        Judgment: Judgment normal.     BP (!) 156/67 (BP Location: Left Arm)   Pulse 71   Resp 18   Ht 5\' 1"  (1.549 m)   Wt 90 lb (40.8 kg)   BMI 17.01 kg/m   Past Medical History:  Diagnosis Date   AK (actinic keratosis) 12/08/2020   right pretibia inferior bx proven, LN2 01/10/21   Burping    Chronic airway obstruction, not elsewhere classified    Dyspnea    Dysrhythmia    Macular degeneration (senile) of retina, unspecified    Malignant neoplasm of urinary bladder (HCC) 03/2019   Partial bladder resection and Rad tx's.    Obstructive chronic bronchitis with exacerbation (HCC)    Osteoporosis, unspecified    Oxygen deficiency    Personal history of peptic ulcer disease    SCC (squamous cell carcinoma) 12/08/2020   right pretibia superior, EDC 01/10/21   SCC (squamous cell carcinoma) 07/03/2022   left medial calf, clear 09/25/22   SCC (squamous cell carcinoma) 11/22/2022   right thigh   treated with ED&C   Squamous cell carcinoma in situ (SCCIS) 07/03/2022   right  pretibia sccis needs treatment with fluorouracil   Tobacco use disorder    Unspecified essential hypertension    Unspecified glaucoma(365.9)     Social History   Socioeconomic History   Marital status: Married    Spouse name: [name]   Number of children: 3   Years of education: Not on file   Highest education level: Not on file  Occupational History   Occupation: RETIRED    Employer: RETIRED  Tobacco Use   Smoking status: Former    Current packs/day: 0.00    Average packs/day: 1.3 packs/day for 45.0 years (58.5 ttl pk-yrs)    Types: Cigarettes    Start date: 96    Quit date: 1998    Years since quitting: 26.9    Passive exposure: Past   Smokeless tobacco: Never  Vaping Use   Vaping status: Never Used  Substance and Sexual Activity   Alcohol use: Yes    Alcohol/week: 14.0 standard drinks of alcohol    Types: 14 Glasses of wine per week    Comment: 1x/day   Drug use: No   Sexual activity: Yes    Birth control/protection: Post-menopausal  Other Topics Concern   Not on file  Social History Narrative   Tobacco abuse x 45 years   Lives with husband in a home    Social Determinants of Health   Financial Resource Strain: Low Risk  (11/12/2022)   Overall Financial Resource Strain (CARDIA)    Difficulty of Paying Living Expenses: Not hard at all  Food Insecurity: No Food Insecurity (11/12/2022)   Hunger Vital Sign    Worried About Running Out of Food in the Last Year: Never true    Ran Out of Food in the Last Year: Never true  Transportation Needs: No Transportation Needs (11/12/2022)   PRAPARE - Administrator, Civil Service (Medical): No    Lack of Transportation (Non-Medical): No  Physical Activity: Insufficiently Active (11/12/2022)   Exercise Vital Sign    Days of Exercise per Week: 7 days    Minutes of Exercise per Session: 20 min  Stress: No Stress Concern Present (11/12/2022)   [employer] of Occupational Health - Occupational Stress Questionnaire     Feeling of Stress : Not at all  Social  Connections: Moderately Isolated (11/12/2022)   Social Connection and Isolation Panel [NHANES]    Frequency of Communication with Friends and Family: More than three times a week    Frequency of Social Gatherings with Friends and Family: Twice a week    Attends Religious Services: Never    Database administrator or Organizations: No    Attends Banker Meetings: Never    Marital Status: Married  Catering manager Violence: Not At Risk (11/12/2022)   Humiliation, Afraid, Rape, and Kick questionnaire    Fear of Current or Ex-Partner: No    Emotionally Abused: No    Physically Abused: No    Sexually Abused: No    Past Surgical History:  Procedure Laterality Date   CATARACT EXTRACTION W/ INTRAOCULAR LENS  IMPLANT, BILATERAL     CYSTOSCOPY WITH STENT PLACEMENT Left 05/22/2019   Procedure: CYSTOSCOPY WITH STENT PLACEMENT;  Surgeon: Sondra Come, MD;  Location: [facility];  Service: Urology;  Laterality: Left;   ELBOW FRACTURE SURGERY Left    LOWER EXTREMITY ANGIOGRAPHY Right 11/20/2021   Procedure: Lower Extremity Angiography;  Surgeon: Annice Needy, MD;  Location: [facility];  Service: Cardiovascular;  Laterality: Right;   LOWER EXTREMITY ANGIOGRAPHY Left 09/03/2022   Procedure: Lower Extremity Angiography;  Surgeon: Annice Needy, MD;  Location: [facility];  Service: Cardiovascular;  Laterality: Left;   LOWER EXTREMITY ANGIOGRAPHY Left 06/03/2023   Procedure: Lower Extremity Angiography;  Surgeon: Annice Needy, MD;  Location: [facility];  Service: Cardiovascular;  Laterality: Left;   TRANSURETHRAL RESECTION OF BLADDER TUMOR N/A 05/22/2019   Procedure: TRANSURETHRAL RESECTION OF BLADDER TUMOR (TURBT);  Surgeon: Sondra Come, MD;  Location: [facility];  Service: Urology;  Laterality: N/A;    Family History  Adopted: Yes    No Known Allergies     Latest Ref Rng & Units 07/26/2022    4:48 AM 07/25/2022    10:32 AM 05/29/2022   10:35 AM  CBC  WBC 4.0 - 10.5 K/uL 3.1  9.7  5.1   Hemoglobin 12.0 - 15.0 g/dL 9.3  08.6  57.8   Hematocrit 36.0 - 46.0 % 28.8  39.7  38.4   Platelets 150 - 400 K/uL 167  287  217.0       CMP     Component Value Date/Time   NA 141 11/06/2022 0756   NA 141 06/10/2019 1324   K 4.1 11/06/2022 0756   K 4.1 07/23/2012 1536   CL 102 11/06/2022 0756   CO2 30 11/06/2022 0756   GLUCOSE 117 (H) 11/06/2022 0756   BUN 17 11/06/2022 0756   BUN 15 06/10/2019 1324   CREATININE 0.71 06/03/2023 0803   CREATININE 1.26 (H) 12/23/2012 1440   CALCIUM 9.5 11/06/2022 0756   PROT 6.5 11/06/2022 0756   PROT 6.6 06/10/2019 1324   ALBUMIN 4.4 11/06/2022 0756   ALBUMIN 4.5 06/10/2019 1324   AST 20 11/06/2022 0756   ALT 21 11/06/2022 0756   ALKPHOS 57 11/06/2022 0756   BILITOT 0.4 11/06/2022 0756   BILITOT 0.4 06/10/2019 1324   GFR 65.72 11/06/2022 0756   GFRNONAA >60 06/03/2023 0803     VAS [place] ABI WITH/WO TBI  Result Date: 07/04/2023  LOWER EXTREMITY DOPPLER STUDY Patient Name:  Misty MENNE  Date of Exam:   07/03/2023 Medical Rec #: [id number]       Accession #:    [id number] Date of Birth: May 19, 1934  Patient Gender: F Patient Age:   19 years Exam Location:  [facility] Procedure:      VAS [place] ABI WITH/WO TBI Referring Phys: JASON DEW --------------------------------------------------------------------------------  Indications: Peripheral artery disease, and Decreased pulses; C/O bilateral heel              pain (R>L). High Risk Factors: Hypertension, past history of smoking.  Vascular Interventions: 06/03/2023 PTA left EIA pop artery, thrombectomy left                         SFA and pop arteries and left SFA stent.                          11/20/2021: Rt EIA/CIA Stent PTA RT SFA.                          09/03/2022: Aortogram and Selective Left Lower Extremity                         Angiogram. PTA of Left SFA and Popliteal Artery with 5                          mm x 22 cm length Lutonix drug coated balloon. Stent                         placement with 6 mm x 6 cm and 6 mm x 4 cm Lifestents to                         the distal SFA and above knee Popliteal Artery. PTA of                         Left EIA wit 6 mm diameter 4 cm length Lutonix drug                         coated angioplasty balloon. Performing Technologist: Hardie Lora RVT  Examination Guidelines: A complete evaluation includes at minimum, Doppler waveform signals and systolic blood pressure reading at the level of bilateral brachial, anterior tibial, and posterior tibial arteries, when vessel segments are accessible. Bilateral testing is considered an integral part of a complete examination. Photoelectric Plethysmograph (PPG) waveforms and toe systolic pressure readings are included as required and additional duplex testing as needed. Limited examinations for reoccurring indications may be performed as noted.  ABI Findings: +---------+------------------+-----+---------+--------+ Right    Rt Pressure (mmHg)IndexWaveform Comment  +---------+------------------+-----+---------+--------+ Brachial 169                                      +---------+------------------+-----+---------+--------+ PTA      151               0.89 triphasic         +---------+------------------+-----+---------+--------+ DP       164               0.97 triphasic         +---------+------------------+-----+---------+--------+ Great Toe110               0.65                   +---------+------------------+-----+---------+--------+ +---------+------------------+-----+---------+-------+  Left     Lt Pressure (mmHg)IndexWaveform Comment +---------+------------------+-----+---------+-------+ Brachial 163                                     +---------+------------------+-----+---------+-------+ PTA      160               0.95 triphasic         +---------+------------------+-----+---------+-------+ DP       164               0.97 triphasic        +---------+------------------+-----+---------+-------+ Great Toe118               0.70                  +---------+------------------+-----+---------+-------+ +-------+-----------+-----------+------------+------------+ ABI/TBIToday's ABIToday's TBIPrevious ABIPrevious TBI +-------+-----------+-----------+------------+------------+ Right  0.97       0.65       0.89        0.59         +-------+-----------+-----------+------------+------------+ Left   0.97       0.70       0.45        0.00         +-------+-----------+-----------+------------+------------+ Right ABIs appear essentially unchanged compared to prior study on 05/20/2023. Left ABIs appear increased compared to prior study on 05/20/2023.  Summary: Right: Resting right ankle-brachial index is within normal range. The right toe-brachial index is abnormal. Left: Resting left ankle-brachial index is within normal range. The left toe-brachial index is normal. *See table(s) above for measurements and observations.  Electronically signed by Levora Dredge MD on 07/04/2023 at 5:40:47 PM.    Final    VAS [place] ABI WITH/WO TBI  Result Date: 05/27/2023  LOWER EXTREMITY DOPPLER STUDY Patient Name:  MEMPHIS MENDEN  Date of Exam:   05/20/2023 Medical Rec #: [id number]       Accession #:    [id number] Date of Birth: August 13, 1934      Patient Gender: F Patient Age:   87 years Exam Location:  [facility] Procedure:      VAS [place] ABI WITH/WO TBI Referring Phys: Barbara Cower DEW --------------------------------------------------------------------------------  Indications: Peripheral artery disease, and Decreased pulses; C/O bilateral heel              pain (R>L). High Risk Factors: Hypertension, past history of smoking.  Vascular Interventions: 11/20/2021: Rt EIA/CIA Stent PTA RT SFA.                          09/03/2022: Aortogram and  Selective Left Lower Extremity                         Angiogram. PTA of Left SFA and Popliteal Artery with 5                         mm x 22 cm length Lutonix drug coated balloon. Stent                         placement with 6 mm x 6 cm and 6 mm x 4 cm Lifestents to                         the distal SFA and above  knee Popliteal Artery. PTA of                         Left EIA wit 6 mm diameter 4 cm length Lutonix drug                         coated angioplasty balloon. Performing Technologist: Hardie Lora RVT  Examination Guidelines: A complete evaluation includes at minimum, Doppler waveform signals and systolic blood pressure reading at the level of bilateral brachial, anterior tibial, and posterior tibial arteries, when vessel segments are accessible. Bilateral testing is considered an integral part of a complete examination. Photoelectric Plethysmograph (PPG) waveforms and toe systolic pressure readings are included as required and additional duplex testing as needed. Limited examinations for reoccurring indications may be performed as noted.  ABI Findings: +---------+------------------+-----+---------+--------+ Right    Rt Pressure (mmHg)IndexWaveform Comment  +---------+------------------+-----+---------+--------+ Brachial 201                                      +---------+------------------+-----+---------+--------+ PTA      168               0.84 triphasic         +---------+------------------+-----+---------+--------+ DP       178               0.89 triphasic         +---------+------------------+-----+---------+--------+ Randie Heinz Toe119               0.59                   +---------+------------------+-----+---------+--------+ +---------+------------------+-----+----------+-------+ Left     Lt Pressure (mmHg)IndexWaveform  Comment +---------+------------------+-----+----------+-------+ Brachial 196                                       +---------+------------------+-----+----------+-------+ PTA      84                0.42 monophasic        +---------+------------------+-----+----------+-------+ DP       90                0.45 monophasic        +---------+------------------+-----+----------+-------+ Great Toe0                 0.00                   +---------+------------------+-----+----------+-------+ +-------+-----------+-----------+------------+------------+ ABI/TBIToday's ABIToday's TBIPrevious ABIPrevious TBI +-------+-----------+-----------+------------+------------+ Right  0.89       0.59       0.88        0.54         +-------+-----------+-----------+------------+------------+ Left   0.45       0.00       0.83        0.50         +-------+-----------+-----------+------------+------------+ Limited imaging showed >50% left external iliac stenosis with a PSV of 546. Right ABIs appear essentially unchanged compared to prior study on 01/01/2023. Left ABIs appear decreased compared to prior study on 01/01/2023.  Summary: Right: Resting right ankle-brachial index indicates mild right lower extremity arterial disease. The right toe-brachial index is abnormal. Left: Resting left ankle-brachial index indicates severe left lower extremity arterial disease. The  left toe-brachial index is abnormal. *See table(s) above for measurements and observations.  Electronically signed by Festus Barren MD on 05/27/2023 at 1:07:00 PM.    Final        Assessment & Plan:   1. Peripheral arterial disease with history of revascularization (HCC) Recommend:  The patient is status post successful angiogram with intervention.  The patient reports that the claudication symptoms and leg pain has improved.   The patient denies lifestyle limiting changes at this point in time.  No further invasive studies, angiography or surgery at this time. The patient should continue walking and begin a more formal exercise program.  The patient  should continue antiplatelet therapy and aggressive treatment of the lipid abnormalities  Continued surveillance is indicated as atherosclerosis is likely to progress with time.    Patient should undergo noninvasive studies as ordered. The patient will follow up with me to review the studies.   2. Essential hypertension, benign Continue antihypertensive medications as already ordered, these medications have been reviewed and there are no changes at this time.  3. HYPERCHOLESTEROLEMIA Continue statin as ordered and reviewed, no changes at this time   Current Outpatient Medications on File Prior to Visit  Medication Sig Dispense Refill   albuterol (PROVENTIL) (2.5 MG/3ML) 0.083% nebulizer solution Take 3 mLs (2.5 mg total) by nebulization every 4 (four) hours as needed for wheezing or shortness of breath. 75 mL 12   albuterol (VENTOLIN HFA) 108 (90 Base) MCG/ACT inhaler Inhale 2 puffs into the lungs every 6 (six) hours as needed for wheezing or shortness of breath. 8 g 0   ascorbic acid (VITAMIN C) 1000 MG tablet Take 1,000 mg by mouth daily.      aspirin EC 81 MG tablet Take 1 tablet (81 mg total) by mouth daily. Swallow whole. 150 tablet 2   atorvastatin (LIPITOR) 10 MG tablet TAKE 1 TABLET EVERY DAY 90 tablet 3   Azelastine HCl 137 MCG/SPRAY SOLN PLACE 2 SPRAYS INTO BOTH NOSTRILS 2 (TWO) TIMES DAILY. USE IN EACH NOSTRIL AS DIRECTED 100 mL 5   Cholecalciferol (VITAMIN D) 125 MCG (5000 UT) CAPS Take 5,000 Units by mouth daily.     clopidogrel (PLAVIX) 75 MG tablet TAKE 1 TABLET EVERY DAY 90 tablet 3   docusate sodium (COLACE) 100 MG capsule Take 100 mg by mouth 2 (two) times daily.     Fluticasone-Umeclidin-Vilant (TRELEGY ELLIPTA) 200-62.5-25 MCG/ACT AEPB Inhale 1 Act into the lungs daily. 1 each 5   guaiFENesin (MUCINEX) 600 MG 12 hr tablet Take 600 mg by mouth 2 (two) times daily.     losartan (COZAAR) 25 MG tablet 25 mg daily.      Multiple Vitamins-Minerals (PRESERVISION AREDS) TABS  Take 1 tablet by mouth 2 (two) times daily.     OXYGEN Inhale 4 L into the lungs daily in the afternoon.     verapamil (VERELAN PM) 360 MG 24 hr capsule Take 360 mg by mouth daily.     Vitamin E 180 MG CAPS Take 180 mg by mouth daily.     budesonide-formoterol (SYMBICORT) 160-4.5 MCG/ACT inhaler Inhale 2 puffs into the lungs in the morning and at bedtime. (Patient not taking: Reported on 07/03/2023) 3 each 0   No current facility-administered medications on file prior to visit.    There are no Patient Instructions on file for this visit. No follow-ups on file.   Georgiana Spinner, NP

## 2023-07-18 DIAGNOSIS — H353134 Nonexudative age-related macular degeneration, bilateral, advanced atrophic with subfoveal involvement: Secondary | ICD-10-CM | POA: Diagnosis not present

## 2023-08-21 ENCOUNTER — Telehealth: Payer: Self-pay | Admitting: Family Medicine

## 2023-08-21 NOTE — Telephone Encounter (Signed)
 Copied from CRM [id number]. Topic: General - Other >> Aug 21, 2023 12:13 PM Evie B wrote: Reason for CRM: Pt called requesting to speak with the Pharmacist. States she is unable to pay for medication. Would like some assistance with paying for medication Trelegy ellipta . 200 mcg-625 mcg-25 power for inhaulation.  Please called pt back at [phone]  Sent to provider pool and pharmacist

## 2023-08-22 ENCOUNTER — Other Ambulatory Visit (INDEPENDENT_AMBULATORY_CARE_PROVIDER_SITE_OTHER): Payer: Medicare HMO | Admitting: Pharmacist

## 2023-08-22 DIAGNOSIS — J9611 Chronic respiratory failure with hypoxia: Secondary | ICD-10-CM

## 2023-08-22 NOTE — Progress Notes (Signed)
   08/22/2023 Name: Misty Ferguson MRN: [id number] DOB: [birth year]  Subjective  No chief complaint on file.   Care Team: Primary Care Provider: Avelina Greig BRAVO, MD  Reason for visit: ?  Misty Ferguson is a 89 y.o. female who presents today for a telephone visit with the pharmacist due to medication access concerns regarding their Trelegy inhaler . ?   Medication Access: ?  Prescription drug coverage: Payor: [employer] MEDICARE / Plan: [employer] MEDICARE HMO / Product Type: *No Product type* / .   Reports that all medications are not affordable.  Copay has increased this month due to Medicare Deductible. Upon patient's husband looking at her ins card, he believes the initial deductible is ~$250  Notably, patient reports they have 1 month of medication remaining.   Current Patient Assistance:  None, though in the past has been [place] in [place] for Symbicort .   Patient lives in a household of 2 with an estimated combined monthly income of only social security . Patient and husband are certain they fall below the 300% limit for [place].   Assessment and Plan:   1. Medication Access Trelegy through [employer] requires $600 out of pocket before they will enroll patients in their PAP program. [employer] ([place]) accepts patients based on income without spend requirement. [place]'s triple therapy (Breztri ) includes same drug classes as current therapy.  [place]: patient portion of application filled out today and will be mailed to home per pt preference.   Patient is not eligible for copay cards due to government insurance.  Future Appointments  Date Time Provider Department Center  10/07/2023 10:00 AM Gaynel Delon CROME, DPM [facility]  10/09/2023  8:00 AM [facility] 3 [facility] None  10/09/2023  9:00 AM [facility] 3 [facility] None  10/09/2023 10:30 AM Delores Orvin BRAVO, NP [facility] None  10/14/2023 10:00 AM Claudene Lehmann, MD [facility] None  11/13/2023  8:50 AM [facility] ANNUAL WELLNESS VISIT 1 [facility]   11/15/2023  8:00 AM [facility] LAB [facility]  11/22/2023 10:20 AM Avelina Greig BRAVO, MD [facility]  05/07/2024  1:00 PM Francisca Redell BROCKS, MD [facility] None   Manuelita FABIENE Kobs, PharmD Clinical Pharmacist [facility] [phone]

## 2023-08-27 ENCOUNTER — Encounter: Payer: Self-pay | Admitting: Pharmacist

## 2023-08-27 NOTE — Progress Notes (Signed)
No forms received in E-Fax folder for Dr. Ermalene Searing to sign.   Please faxed to [phone].

## 2023-08-27 NOTE — Progress Notes (Addendum)
 Manufacturer Assistance Program (MAP) Application   Manufacturer: [employer] (AZ&Me)    (New enrollment) Medication(s): Breztri    Patient Portion of Application:  1/9:  Filled out by pharmacist. Emailed to clinic admin pool for print and mail to patient's home 1/13: Patient called reporting has not received app in the mail. Reports he would like to pick up at office today 1/14: Confirmed by front office, patient's husband picked up application, brought home to wife, then returned to clinic same day. Zenaida Blush Patient dropped off forms placed in Box up front  Income Documentation: N/A - Electronic verification elected.  Provider Portion of Application:  1/14:  Provider page completed by PharmD and added to eFax folder for print/signature.  Clinical pool cc'd to ensure PCP signature. Form to be returned to pharmacist folder to be submitted along with patient pages.  Prescription(s): Included in MAP application.   Application Status: APPROVAL THROUGH 08/12/2024  Next Steps: [x]    Patient signature (08/26/23) [x]    PCP signature (1/15) [x]    Upon PCP signature, Application to be placed in Pharmacist folder. Pharmacist will then fax to [facility] along with signed patient pages to complete application.   - Pharm Received. Application faxed w patient + provider pages + insurance card (08/28/23) [x]    Fax confirmation of approval received/documented (08/29/2023)  Forwarded to [facility] Patient Advocate Team for future correspondences/re-enrollment.  Note routed to PCP Clinic Pool to ensure PCP signature is obtained and application is faxed.  *[facility] clinic team - Please Addend/update this note as the Next Steps are completed in office*

## 2023-08-28 NOTE — Progress Notes (Signed)
Form signed by Dr. Ermalene Searing and placed in Lindsay's folder in front office.

## 2023-09-04 ENCOUNTER — Encounter: Payer: Self-pay | Admitting: Urology

## 2023-09-04 ENCOUNTER — Ambulatory Visit
Admission: RE | Admit: 2023-09-04 | Discharge: 2023-09-04 | Disposition: A | Discharge status: Home / Self Care | Payer: Medicare HMO | Source: Ambulatory Visit | Attending: Urology | Admitting: Urology

## 2023-09-04 ENCOUNTER — Ambulatory Visit: Payer: Medicare HMO | Admitting: Urology

## 2023-09-04 VITALS — BP 148/78

## 2023-09-04 DIAGNOSIS — R339 Retention of urine, unspecified: Secondary | ICD-10-CM

## 2023-09-04 DIAGNOSIS — N132 Hydronephrosis with renal and ureteral calculous obstruction: Secondary | ICD-10-CM | POA: Diagnosis not present

## 2023-09-04 DIAGNOSIS — C672 Malignant neoplasm of lateral wall of bladder: Secondary | ICD-10-CM

## 2023-09-04 DIAGNOSIS — Z8551 Personal history of malignant neoplasm of bladder: Secondary | ICD-10-CM | POA: Diagnosis not present

## 2023-09-04 DIAGNOSIS — C679 Malignant neoplasm of bladder, unspecified: Secondary | ICD-10-CM | POA: Diagnosis not present

## 2023-09-04 DIAGNOSIS — R310 Gross hematuria: Secondary | ICD-10-CM

## 2023-09-04 DIAGNOSIS — R319 Hematuria, unspecified: Secondary | ICD-10-CM | POA: Diagnosis not present

## 2023-09-04 DIAGNOSIS — K861 Other chronic pancreatitis: Secondary | ICD-10-CM | POA: Diagnosis not present

## 2023-09-04 DIAGNOSIS — R911 Solitary pulmonary nodule: Secondary | ICD-10-CM

## 2023-09-04 LAB — URINALYSIS, COMPLETE
Bilirubin, UA: NEGATIVE
Glucose, UA: NEGATIVE
Ketones, UA: NEGATIVE
Leukocytes,UA: NEGATIVE
Nitrite, UA: NEGATIVE
Specific Gravity, UA: 1.02 (ref 1.005–1.030)
Urobilinogen, Ur: 0.2 mg/dL (ref 0.2–1.0)
pH, UA: 7 (ref 5.0–7.5)

## 2023-09-04 LAB — POCT I-STAT CREATININE: Creatinine, Ser: 0.9 mg/dL (ref 0.44–1.00)

## 2023-09-04 LAB — MICROSCOPIC EXAMINATION: RBC, Urine: >30 /[HPF] — AB (ref 0–2)

## 2023-09-04 LAB — BLADDER SCAN AMB NON-IMAGING: Scan Result: 171

## 2023-09-04 LAB — I-STAT CREATININE

## 2023-09-04 MED ORDER — IOHEXOL 300 MG/ML  SOLN
100.0000 mL | Freq: Once | INTRAMUSCULAR | Status: AC | PRN
  Administered 2023-09-04: 75 mL via INTRAVENOUS

## 2023-09-04 NOTE — Progress Notes (Signed)
09/04/2023 4:46 PM   Misty Ferguson 03/13/1934 [id number]  Referring provider: Excell Seltzer, MD [address] [place],  [place] [address]  Urological history: 1.  Muscle invasive bladder cancer -September 2020 with gross hematuria and CTU showed 3cm left sided bladder mass with left hydronephrosis, sCr 1.46(eGFR33)  -TURBT 05/22/2019 with resection of all visible disease(HG T2 urothelial cell carcinoma with squamous differentiation), left ureteral stent placement. Renal function improved after stent to 0.98(eGFR 54), stent removed in follow-up.  She deferred concurrent chemotherapy, but did undergo radiation to the bladder and the pelvic lymph nodes with Dr. Rushie Chestnut completed in December 2020.  -Patient has chosen yearly surveillance with renal/bladder ultrasound   Chief Complaint  Patient presents with   gross hematuria   HPI: Misty Ferguson is a 89 y.o. female who presents today for inability to urinate since 6 am with her husband, [name].   Previous records reviewed.   She has been having a weak urinary stream for several months.  This morning she got up at 6 AM, had her cup of coffee and a glass of water and then had the urge to void.  She went to the toilet and was unable to pass urine all she got out was some blood.  She has been unable to void and could not give provide a specimen.  She is very uncomfortable with an intense urge to void.    Patient denies any modifying or aggravating factors.  Patient denies any recent UTI's, dysuria or suprapubic/flank pain.  Patient denies any fevers, chills, nausea or vomiting.    PVR 171 mL   PMH: Past Medical History:  Diagnosis Date   AK (actinic keratosis) 12/08/2020   right pretibia inferior bx proven, LN2 01/10/21   Burping    Chronic airway obstruction, not elsewhere classified    Dyspnea    Dysrhythmia    Macular degeneration (senile) of retina, unspecified    Malignant neoplasm of urinary bladder (HCC) 03/2019    Partial bladder resection and Rad tx's.    Obstructive chronic bronchitis with exacerbation (HCC)    Osteoporosis, unspecified    Oxygen deficiency    Personal history of peptic ulcer disease    SCC (squamous cell carcinoma) 12/08/2020   right pretibia superior, EDC 01/10/21   SCC (squamous cell carcinoma) 07/03/2022   left medial calf, clear 09/25/22   SCC (squamous cell carcinoma) 11/22/2022   right thigh   treated with ED&C   Squamous cell carcinoma in situ (SCCIS) 07/03/2022   right pretibia sccis needs treatment with fluorouracil   Tobacco use disorder    Unspecified essential hypertension    Unspecified glaucoma(365.9)     Surgical History: Past Surgical History:  Procedure Laterality Date   CATARACT EXTRACTION W/ INTRAOCULAR LENS  IMPLANT, BILATERAL     CYSTOSCOPY WITH STENT PLACEMENT Left 05/22/2019   Procedure: CYSTOSCOPY WITH STENT PLACEMENT;  Surgeon: Sondra Come, MD;  Location: [facility] ORS;  Service: Urology;  Laterality: Left;   ELBOW FRACTURE SURGERY Left    LOWER EXTREMITY ANGIOGRAPHY Right 11/20/2021   Procedure: Lower Extremity Angiography;  Surgeon: Annice Needy, MD;  Location: [facility];  Service: Cardiovascular;  Laterality: Right;   LOWER EXTREMITY ANGIOGRAPHY Left 09/03/2022   Procedure: Lower Extremity Angiography;  Surgeon: Annice Needy, MD;  Location: [facility];  Service: Cardiovascular;  Laterality: Left;   LOWER EXTREMITY ANGIOGRAPHY Left 06/03/2023   Procedure: Lower Extremity Angiography;  Surgeon: Wyn Quaker,  Marlow Baars, MD;  Location: [facility];  Service: Cardiovascular;  Laterality: Left;   TRANSURETHRAL RESECTION OF BLADDER TUMOR N/A 05/22/2019   Procedure: TRANSURETHRAL RESECTION OF BLADDER TUMOR (TURBT);  Surgeon: Sondra Come, MD;  Location: [facility] OR[facility];  Service: Urology;  Laterality: N/A;    Home Medications:  Allergies as of 09/04/2023   No Known Allergies      Medication List        Accurate as of September 04, 2023   4:46 PM. If you have any questions, ask your nurse or doctor.          albuterol 108 (90 Base) MCG/ACT inhaler Commonly known as: VENTOLIN HFA Inhale 2 puffs into the lungs every 6 (six) hours as needed for wheezing or shortness of breath.   albuterol (2.5 MG/3ML) 0.083% nebulizer solution Commonly known as: PROVENTIL Take 3 mLs (2.5 mg total) by nebulization every 4 (four) hours as needed for wheezing or shortness of breath.   ascorbic acid 1000 MG tablet Commonly known as: VITAMIN C Take 1,000 mg by mouth daily.   aspirin EC 81 MG tablet Take 1 tablet (81 mg total) by mouth daily. Swallow whole.   atorvastatin 10 MG tablet Commonly known as: LIPITOR TAKE 1 TABLET EVERY DAY   Azelastine HCl 137 MCG/SPRAY Soln PLACE 2 SPRAYS INTO BOTH NOSTRILS 2 (TWO) TIMES DAILY. USE IN EACH NOSTRIL AS DIRECTED   budesonide-formoterol 160-4.5 MCG/ACT inhaler Commonly known as: Symbicort Inhale 2 puffs into the lungs in the morning and at bedtime.   clopidogrel 75 MG tablet Commonly known as: PLAVIX TAKE 1 TABLET EVERY DAY   docusate sodium 100 MG capsule Commonly known as: COLACE Take 100 mg by mouth 2 (two) times daily.   guaiFENesin 600 MG 12 hr tablet Commonly known as: MUCINEX Take 600 mg by mouth 2 (two) times daily.   losartan 25 MG tablet Commonly known as: COZAAR 25 mg daily.   OXYGEN Inhale 4 L into the lungs daily in the afternoon.   PreserVision AREDS Tabs Take 1 tablet by mouth 2 (two) times daily.   Trelegy Ellipta 200-62.5-25 MCG/ACT Aepb Generic drug: Fluticasone-Umeclidin-Vilant Inhale 1 Act into the lungs daily.   verapamil 360 MG 24 hr capsule Commonly known as: VERELAN Take 360 mg by mouth daily.   Vitamin D 125 MCG (5000 UT) Caps Take 5,000 Units by mouth daily.   Vitamin E 180 MG Caps Take 180 mg by mouth daily.        Allergies: No Known Allergies  Family History: Family History  Adopted: Yes    Social History:  reports that she  quit smoking about 27 years ago. Her smoking use included cigarettes. She started smoking about 72 years ago. She has a 58.5 pack-year smoking history. She has been exposed to tobacco smoke. She has never used smokeless tobacco. She reports current alcohol use of about 14.0 standard drinks of alcohol per week. She reports that she does not use drugs.  ROS: Pertinent ROS in HPI  Physical Exam: BP (!) 148/78   Constitutional:  Well nourished. Alert and oriented, No acute distress. HEENT: [place] AT, moist mucus membranes.  Trachea midline, no masses. Cardiovascular: No clubbing, cyanosis, or edema. Respiratory: Normal respiratory effort, no increased work of breathing. GU: No CVA tenderness.  No bladder fullness or masses.  Recession of labia minora, dry, pale vulvar vaginal mucosa and loss of mucosal ridges and folds.  Normal urethral meatus, no lesions, no prolapse, no discharge.  No urethral masses, tenderness and/or tenderness.  Anus and perineum are without rashes or lesions.    Neurologic: Grossly intact, no focal deficits, moving all 4 extremities. Psychiatric: Normal mood and affect.    Laboratory Data: Lab Results  Component Value Date   CREATININE 0.90 09/04/2023    Lab Results  Component Value Date   HGBA1C 5.7 11/06/2022      Component Value Date/Time   CHOL 202 (H) 11/06/2022 0756   HDL 129.40 11/06/2022 0756   CHOLHDL 2 11/06/2022 0756   VLDL 9.8 11/06/2022 0756   LDLCALC 63 11/06/2022 0756    Lab Results  Component Value Date   AST 20 11/06/2022   Lab Results  Component Value Date   ALT 21 11/06/2022    Urinalysis See EPIC and HPI  I have reviewed the labs.  Simple Catheter Placement  Due to urinary retention patient is present today for a foley cath placement.  Patient was cleaned and prepped in a sterile fashion with betadine and 2% lidocaine jelly was instilled into the urethra. A 18  FR foley catheter was inserted, urine return was noted  200 ml, urine  was maroon in color.  The balloon was filled with 10cc of sterile water.  I irrigated the catheter with 1 L of sterile water until it was pink clear.  A leg bag was attached for drainage.  The urine turned bloody again, so I irrigated a second time with 500 mL of sterile water and just retrieved a very small clot.  Patient tolerated well, no complications were noted   Performed by: Michiel Cowboy, PA-C and Humberta Magallon-Mariche, CMA   Catheter Removal  Patient is present today for a catheter removal.  10 ml of water was drained from the balloon. A 18 FR foley cath was removed from the bladder, no complications were noted. Patient tolerated well.  Performed by: Michiel Cowboy, PA-C     Pertinent Imaging: CLINICAL DATA:  Bladder cancer with hematuria. * Tracking Code: BO *   EXAM: CT ABDOMEN AND PELVIS WITHOUT AND WITH CONTRAST   TECHNIQUE: Multidetector CT imaging of the abdomen and pelvis was performed following the standard protocol before and following the bolus administration of intravenous contrast.   RADIATION DOSE REDUCTION: This exam was performed according to the departmental dose-optimization program which includes automated exposure control, adjustment of the mA and/or kV according to patient size and/or use of iterative reconstruction technique.   CONTRAST:  75mL OMNIPAQUE IOHEXOL 300 MG/ML  SOLN   COMPARISON:  10/14/2020   FINDINGS: Lower chest: Emphysema noted in the lung bases. 1.3 x 1.2 cm nodule is identified in the periphery of the right lower lobe, new in the interval. There appears to be some peripheral airway impaction proximal to this lesion suggesting infectious/inflammatory etiology.   Hepatobiliary: Stable ill-defined hypoattenuation along the falciform ligament, likely focal fatty deposition. Liver otherwise unremarkable. There is no evidence for gallstones, gallbladder wall thickening, or pericholecystic fluid. No intrahepatic or extrahepatic  biliary dilation.   Pancreas: Innumerable parenchymal calcifications are seen in the pancreas with similar appearance of diffuse main duct dilatation.   Spleen: No splenomegaly. No suspicious focal mass lesion.   Adrenals/Urinary Tract: No adrenal nodule or mass.   Precontrast imaging shows no stones in the right kidney. Punctate nonobstructing stone identified lower pole left kidney on 23/2. Ureters are not well demonstrated on precontrast imaging, but no evidence for hydroureter. No definite ureteral stone. Pre contrast imaging shows a Foley catheter balloon in  the bladder lumen.   Imaging after IV contrast administration shows no suspicious enhancing mass lesion in either kidney. Tiny well-defined homogeneous low-density lesions in both kidneys are too small to characterize but are statistically most likely benign and probably cysts. No followup imaging is recommended.   Delayed post-contrast imaging shows no wall thickening or soft tissue filling defect in either intrarenal collecting system or renal pelvis. Right ureter is relatively well opacified and unremarkable. Mild left hydronephrosis without substantial left hydroureter. Left ureter is incompletely opacified but shows no focal dilatation or discernible mass lesion along its length. Large filling defect in the bladder lumen is probably blood clot. Soft tissue lesion posterolateral left bladder wall in the region of the UVJ is not excluded. 7 mm sessile polypoid lesion along the anterior bladder dome appears to be a mucosal lesion on axial 62/13 and coronal 30/16). Gas in the lumen of the urinary bladder is presumably secondary to the instrumentation although infection could cause this appearance.   Stomach/Bowel: Stomach is distended with fluid and gas. Duodenum is normally positioned as is the ligament of Treitz. No small bowel wall thickening. No small bowel dilatation. The appendix is normal. No gross colonic mass.  No colonic wall thickening. Prominent volume gas and formed stool in the rectum.   Vascular/Lymphatic: There is advanced atherosclerotic calcification of the abdominal aorta without aneurysm. There is no gastrohepatic or hepatoduodenal ligament lymphadenopathy. No retroperitoneal or mesenteric lymphadenopathy. No pelvic sidewall lymphadenopathy.   Reproductive: There is no adnexal mass.   Other: No intraperitoneal free fluid.   Musculoskeletal: No worrisome lytic or sclerotic osseous abnormality.   IMPRESSION: 1. Large filling defect in the bladder lumen is probably blood clot. Soft tissue lesion posterolateral left bladder wall in the region of the UVJ is not excluded. 2. 7 mm sessile polypoid lesion along the anterior bladder dome appears to be a mucosal lesion compatible with neoplasm. 3. Mild left hydronephrosis without substantial left hydroureter. Left ureter is incompletely opacified but shows no focal dilatation or discernible mass lesion along its length. 4. Punctate nonobstructing stone lower pole left kidney. 5. 1.3 x 1.2 cm nodule in the periphery of the right lower lobe, new in the interval. There appears to be some peripheral airway impaction proximal to this lesion suggesting infectious/inflammatory etiology. As neoplasm is not excluded, close follow-up recommended. Consider repeat CT chest in 3 months. 6. Sequelae of chronic pancreatitis. 7.  Aortic Atherosclerosis (ICD10-I70.0).     Electronically Signed   By: Kennith Center M.D.   On: 09/04/2023 15:40 I have independently reviewed the films.  See HPI.     Assessment & Plan:    1.  Gross hematuria -UA w/ gross heme, does not look infected -urine culture pending -likely secondary to radiation cystitis or bladder cancer recurrence (7 mm sessile polypoid lesion in the anterior bladder dome which is very suspicious for bladder cancer recurrence on today's CT urogram) also because of blood clot and Foley  catheter being in place it was difficult to evaluate the entire bladder -At this point, they would like me to speak with Dr. Richardo Hanks regarding his recommendations on how she should go forward -discussed case with Dr. Apolinar Junes regarding the catheter and we felt leaving the catheter in place would continue to irritate the bladder and cause more gross heme, so I removed the Foley -Also advised her to stop aspirin and Plavix until the gross hematuria resolves -Reviewed red flag signs, such as inability to urinate, fever/chills, feeling weak, dizzy,  hypotensive, chest pain or shortness of breath worse than her baseline  2. Bladder cancer -Possible recurrence seen on today's CT urogram -Will discuss this further with Dr. Richardo Hanks as they are wanting his recommendations on next step  3. Pulmonary nodule -Explained that a 1.3 x 1.2 cm nodule was found in her right lower lobe which radiologist felt was a new and is suggesting a repeat CT chest in 3 months, I gave her and her husband the report and advised them to follow-up with their PCP or pulmonologist for further evaluation   Return for pending Dr. Keane Scrape recommendations .  These notes generated with voice recognition software. I apologize for typographical errors.  Cloretta Ned  [facility] [address]  [address] [place], [place] [address] [phone]

## 2023-09-04 NOTE — Telephone Encounter (Signed)
Pt seen in office

## 2023-09-04 NOTE — Patient Instructions (Signed)
Please stop your aspirin and Plavix until your urine is back to yellow

## 2023-09-05 ENCOUNTER — Other Ambulatory Visit: Payer: Self-pay

## 2023-09-05 ENCOUNTER — Ambulatory Visit: Payer: Medicare HMO | Admitting: Urology

## 2023-09-05 VITALS — Ht 61.0 in | Wt 90.0 lb

## 2023-09-05 DIAGNOSIS — R310 Gross hematuria: Secondary | ICD-10-CM

## 2023-09-05 MED ORDER — ALBUTEROL SULFATE HFA 108 (90 BASE) MCG/ACT IN AERS: 2.0000 | INHALATION_SPRAY | Freq: Four times a day (QID) | RESPIRATORY_TRACT | 6 refills | Status: DC | PRN

## 2023-09-05 NOTE — Progress Notes (Signed)
Let patient know that I reviewed her note from urology from January 22.  They had noted on CT scan a new pulmonary nodule.  Will she plan on following up on this with her pulmonologist?  Following up with pulmonologist would be my preference.  Or do we need to order a repeat chest CT in 3 months?

## 2023-09-05 NOTE — Progress Notes (Signed)
   09/05/2023 2:40 PM   Misty Ferguson 10-Apr-1934 [id number]  Reason for visit: History of stage II bladder cancer, new gross hematuria  HPI: 89 year old frail female with COPD on oxygen who originally presented in September 2020 with gross hematuria and CTU showed 3cm left sided bladder mass with left hydronephrosis, sCr 1.46(eGFR33).  She underwent TURBT 05/22/2019 with resection of all visible disease(HG T2 urothelial cell carcinoma with squamous differentiation), left ureteral stent placement. Renal function improved after stent to 0.98(eGFR 54), stent removed in follow-up.  She deferred concurrent chemotherapy, but did undergo radiation to the bladder and the pelvic lymph nodes with Dr. Rushie Chestnut completed in December 2020.   She overall had been doing well since that time with no evidence of recurrence.  With her other medical issues she opted for surveillance with yearly renal/bladder ultrasounds alone, and ultrasound was benign in September 2024.  She presented to clinic 09/04/2023 with gross hematuria and difficulty voiding, bladder scan was 170 mL, and Michiel Cowboy, PA placed 18 French Foley catheter with return of bloody urine, irrigated the catheter, the catheter was ultimately removed.  She also ordered a CT urogram.  I personally viewed and interpreted the CT urogram from 1/22 that appears to show a large formed clot in the bladder, no definite evidence of recurrence or metastatic disease, stable mild left hydronephrosis that has been present since her initial TURBT, possible subtle 6 mm mucosal lesion right side of the bladder versus mucosal fold.  She is on Plavix for history of vascular disease, most recently underwent a vascular procedure in October 2024.  Last dose of Plavix was 1/21 in the evening.  She was added to clinic today for lower abdominal discomfort and difficulty voiding with persistent hematuria.  Bladder scan on arrival was .  In standard sterile fashion a 24  [place] Roush hematuria catheter was advanced into the bladder with return of maroon urine.  maroon urine was drained.  The bladder was irrigated extensively with 3 L of sterile water with return of approximately 300 cc of old dark-colored clot.  The urine was able to be irrigated to clear.  Catheter was connected to dependent drainage.  I recommended continuing to hold her Plavix, and follow-up early next week for Foley removal and cystoscopy for evaluation of gross hematuria, high suspicion for radiation cystitis, though recurrence of bladder tumor certainly possible.  Return precautions were discussed.  I spent 45 total minutes on the day of the encounter including pre-visit review of the medical record, face-to-face time with the patient, and post visit ordering of labs/imaging/tests.   Sondra Come, MD  [facility] [address], [address] [place], [place] [address] [phone]

## 2023-09-05 NOTE — Telephone Encounter (Signed)
Called pt, she states that she is having abdominal pain. She has had some relief with Tylenol 500mg  tablet x 2 but is still uncomfortable. She states that she has not been able to void with a full urinary stream today, states that she has had a dribble a few times today only. Denies fever or chills. Pt advised to come into clinic asap for bladder scan.

## 2023-09-05 NOTE — Progress Notes (Signed)
Note sent to Dr. Belia Heman Pulmonary re: recent CT changes seen at urologist 09/04/23

## 2023-09-07 LAB — CULTURE, URINE COMPREHENSIVE

## 2023-09-10 ENCOUNTER — Ambulatory Visit: Payer: Medicare HMO | Admitting: Urology

## 2023-09-10 ENCOUNTER — Encounter: Payer: Self-pay | Admitting: Urology

## 2023-09-10 VITALS — BP 114/57 | HR 98

## 2023-09-10 DIAGNOSIS — Z8551 Personal history of malignant neoplasm of bladder: Secondary | ICD-10-CM

## 2023-09-10 DIAGNOSIS — R82998 Other abnormal findings in urine: Secondary | ICD-10-CM | POA: Diagnosis not present

## 2023-09-10 DIAGNOSIS — N3289 Other specified disorders of bladder: Secondary | ICD-10-CM | POA: Diagnosis not present

## 2023-09-10 DIAGNOSIS — R310 Gross hematuria: Secondary | ICD-10-CM

## 2023-09-10 NOTE — Progress Notes (Signed)
   09/10/2023 4:16 PM   Misty Ferguson 01-13-1934 [id number]  Reason for visit: Follow up as hematuria, history of bladder cancer  HPI: 89 year old frail female with COPD on oxygen who originally presented in September 2020 with gross hematuria and CTU showed 3cm left sided bladder mass with left hydronephrosis, sCr 1.46(eGFR33).  She underwent TURBT 05/22/2019 with resection of all visible disease(HG T2 urothelial cell carcinoma with squamous differentiation), left ureteral stent placement. Renal function improved after stent to 0.98(eGFR 54), stent removed in follow-up.  She deferred concurrent chemotherapy, but did undergo radiation to the bladder and the pelvic lymph nodes with Dr. Rushie Chestnut completed in December 2020.   She developed significant gross hematuria and clot retention on 09/04/2023, ultimately underwent extensive irrigation with me through a 24 [place] three-way Foley with removal of 300 mL old clot to clear.  She is holding her Plavix.  CT showed no definite recurrence of bladder cancer, large formed clot prior to extensive irrigation.  Today she is having quite a bit of trouble with shortness of breath and breathing, and defers cystoscopy that was scheduled today.  Urine in the catheter is faint pink.  I irrigated the catheter and it remained clear with no clots.  Ultimately, using shared decision making, she would like to send a urine cytology today instead of undergo cystoscopy, and we again reviewed possible causes of her gross hematuria including recurrence of bladder cancer or radiation cystitis.  They do not want the Foley to be removed in case she develops retention overnight, and a syringe was given so her husband can remove her Foley first thing in the morning and she will reach out if any trouble voiding.  -Urine cytology today, call with results, if positive recommend OR for cystoscopy and bladder biopsy/fulguration -Foley will be removed tomorrow morning at home by her  husband -Very challenging scenario with her overall decline in health and poor functional status -Continue to hold Plavix, will set up virtual visit next week to consider resuming and check urine status   Sondra Come, MD  [facility] [address] [place], [place] [address] [phone]

## 2023-09-19 ENCOUNTER — Telehealth: Payer: Medicare HMO | Admitting: Urology

## 2023-09-19 DIAGNOSIS — R310 Gross hematuria: Secondary | ICD-10-CM

## 2023-09-19 NOTE — Progress Notes (Signed)
 Virtual Visit via audio/video note  I connected with Misty Ferguson on 09/19/23 at 11:45 AM EST by Doximity app audio/video and verified that I am speaking with the correct person using two identifiers.   Patient location: Home Provider location: [facility]   I discussed the limitations, risks, security and privacy concerns of performing an evaluation and management service by telephone and the availability of in person appointments. We discussed the impact of the COVID-19 pandemic on the healthcare system, and the importance of social distancing and reducing patient and provider exposure. I also discussed with the patient that there may be a patient responsible charge related to this service. The patient expressed understanding and agreed to proceed.  Reason for visit: History of bladder cancer, gross hematuria  History of Present Illness: 89 year old frail female with COPD on oxygen  who originally presented in September 2020 with gross hematuria and CTU showed 3cm left sided bladder mass with left hydronephrosis, sCr 1.46(eGFR33).  She underwent TURBT 05/22/2019 with resection of all visible disease(HG T2 urothelial cell carcinoma with squamous differentiation), left ureteral stent placement. Renal function improved after stent to 0.98(eGFR 54), stent removed in follow-up.  She deferred concurrent chemotherapy, but did undergo radiation to the bladder and the pelvic lymph nodes with Dr. Lenn completed in December 2020.   She developed significant gross hematuria and clot retention on 09/04/2023, ultimately underwent extensive irrigation with me through a 24 French three-way Foley with removal of 300 mL old clot to clear.  CT showed no definite recurrence of bladder cancer, large formed clot prior to extensive irrigation.  Her overall health has declined with significant weakness and increased oxygen  needs secondary to COPD, she has deferred cystoscopy for further evaluation of her  gross hematuria and opted for voided urine cytology.  This showed only atypical cells with no definite evidence of bladder cancer recurrence.  High suspicion for radiation cystitis as etiology of her recurrent gross hematuria.  Her Foley was removed 09/11/2023, urine has completely cleared and she denies any gross hematuria or clots.  She is voiding normally.  She has resumed her Plavix .  She would like to defer cystoscopy at this time which I think is very reasonable, return precautions discussed extensively including recurrence of gross hematuria.  Not a candidate for hyperbaric oxygen  treatment for possible radiation cystitis with her severe COPD.  Follow Up:  Keep scheduled follow-up September 2025, I also follow-up with her husband in April 2025 and will touch base with them then, they know to call for any recurrence of gross hematuria or other problems that would warrant cystoscopy for further evaluation   I discussed the assessment and treatment plan with the patient. The patient was provided an opportunity to ask questions and all were answered. The patient agreed with the plan and demonstrated an understanding of the instructions.   The patient was advised to call back or seek an in-person evaluation if the symptoms worsen or if the condition fails to improve as anticipated.  I provided 6 minutes of non-face-to-face time during this encounter.   Redell JAYSON Burnet, MD

## 2023-10-01 DIAGNOSIS — J432 Centrilobular emphysema: Secondary | ICD-10-CM | POA: Diagnosis not present

## 2023-10-01 DIAGNOSIS — E7800 Pure hypercholesterolemia, unspecified: Secondary | ICD-10-CM | POA: Diagnosis not present

## 2023-10-01 DIAGNOSIS — I739 Peripheral vascular disease, unspecified: Secondary | ICD-10-CM | POA: Diagnosis not present

## 2023-10-01 DIAGNOSIS — I10 Essential (primary) hypertension: Secondary | ICD-10-CM | POA: Diagnosis not present

## 2023-10-01 DIAGNOSIS — Z87898 Personal history of other specified conditions: Secondary | ICD-10-CM | POA: Diagnosis not present

## 2023-10-01 DIAGNOSIS — J9611 Chronic respiratory failure with hypoxia: Secondary | ICD-10-CM | POA: Diagnosis not present

## 2023-10-07 ENCOUNTER — Ambulatory Visit: Payer: Medicare HMO | Admitting: Podiatry

## 2023-10-07 ENCOUNTER — Encounter: Payer: Self-pay | Admitting: Podiatry

## 2023-10-07 VITALS — Ht 61.0 in | Wt 90.0 lb

## 2023-10-07 DIAGNOSIS — G6289 Other specified polyneuropathies: Secondary | ICD-10-CM | POA: Diagnosis not present

## 2023-10-07 DIAGNOSIS — M79676 Pain in unspecified toe(s): Secondary | ICD-10-CM

## 2023-10-07 DIAGNOSIS — I739 Peripheral vascular disease, unspecified: Secondary | ICD-10-CM

## 2023-10-07 DIAGNOSIS — B351 Tinea unguium: Secondary | ICD-10-CM

## 2023-10-07 NOTE — Progress Notes (Signed)
  Subjective:  Patient ID: Misty Ferguson, female    DOB: 10-11-1933,  MRN: [id number]  89 y.o. female presents with at risk foot care. Patient has h/o PAD and neuropathy and painful, discolored, thick toenails which interfere with daily activities Chief Complaint  Patient presents with   Nail Problem    Pt is here for [facility] PCP is Dr Ermalene Searing and LOV was in September.     PCP: Excell Seltzer, MD.  New problem(s): None.   Review of Systems: Negative except as noted in the HPI.   No Known Allergies  Objective:  There were no vitals filed for this visit. Constitutional Patient is a pleasant 89 y.o. female frail, in NAD. AAO x 3. On supplemental oxygen.  Vascular Capillary fill time to digits <3 seconds.  DP/PT pulse(s) are faintly palpable b/l lower extremities. Pedal hair absent b/l. Lower extremity skin temperature gradient warm to cool b/l. No pain with calf compression b/l. No cyanosis or clubbing noted. No ischemia nor gangrene noted b/l. +1 edema noted dorsal aspect left foot. Trace edema noted right foot.  Neurologic Protective sensation diminished with 10g monofilament b/l.  Dermatologic Pedal skin is thin, shiny and atrophic b/l.  No open wounds b/l lower extremities. No interdigital macerations b/l lower extremities. Toenails 1-5 b/l elongated, discolored, dystrophic, thickened, crumbly with subungual debris and tenderness to dorsal palpation. No hyperkeratotic nor porokeratotic lesions present on today's visit.  Orthopedic: Normal muscle strength 5/5 to all lower extremity muscle groups bilaterally. Hammertoe(s) noted to the L 5th toe and R 5th toe. Utilizes transport chair for ambulation assistance.   Last HgA1c:     Latest Ref Rng & Units 11/06/2022    7:56 AM  Hemoglobin A1C  Hemoglobin-A1c 4.6 - 6.5 % 5.7      Assessment:   1. Pain due to onychomycosis of toenail   2. Other polyneuropathy   3. PVD (peripheral vascular disease) (HCC)    Plan:  -Patient's family member  present. All questions/concerns addressed on today's visit. -Patient to continue soft, supportive shoe gear daily. -Mycotic toenails 1-5 bilaterally were debrided in length and girth with sterile nail nippers and dremel without incident. -Patient/POA to call should there be question/concern in the interim.  Return in about 3 months (around 01/04/2024).  Freddie Breech, DPM      [place] LOCATION: [address] [place] [address]                   Office [phone]   [facility] LOCATION: [address] [place], [place] [address] Office [phone]

## 2023-10-08 ENCOUNTER — Other Ambulatory Visit (INDEPENDENT_AMBULATORY_CARE_PROVIDER_SITE_OTHER): Payer: Self-pay | Admitting: Vascular Surgery

## 2023-10-08 DIAGNOSIS — Z9889 Other specified postprocedural states: Secondary | ICD-10-CM

## 2023-10-09 ENCOUNTER — Ambulatory Visit (INDEPENDENT_AMBULATORY_CARE_PROVIDER_SITE_OTHER): Payer: Medicare HMO

## 2023-10-09 ENCOUNTER — Ambulatory Visit (INDEPENDENT_AMBULATORY_CARE_PROVIDER_SITE_OTHER): Payer: Medicare HMO | Admitting: Nurse Practitioner

## 2023-10-09 ENCOUNTER — Encounter (INDEPENDENT_AMBULATORY_CARE_PROVIDER_SITE_OTHER): Payer: Self-pay | Admitting: Nurse Practitioner

## 2023-10-09 VITALS — BP 162/65 | HR 72 | Resp 15 | Wt 90.0 lb

## 2023-10-09 DIAGNOSIS — I10 Essential (primary) hypertension: Secondary | ICD-10-CM | POA: Diagnosis not present

## 2023-10-09 DIAGNOSIS — Z9889 Other specified postprocedural states: Secondary | ICD-10-CM

## 2023-10-09 DIAGNOSIS — I739 Peripheral vascular disease, unspecified: Secondary | ICD-10-CM

## 2023-10-09 DIAGNOSIS — E7800 Pure hypercholesterolemia, unspecified: Secondary | ICD-10-CM | POA: Diagnosis not present

## 2023-10-09 NOTE — Progress Notes (Signed)
 Subjective:    Patient ID: Misty Ferguson, female    DOB: Nov 04, 1933, 89 y.o.   MRN: [id number] Chief Complaint  Patient presents with   Follow-up    3 month abi and left arterial duplex    Misty Ferguson is an 89 year old female who follows up 3 months after her most recent intervention to her left lower extremity.  Overall she has been doing fairly well.  She has a wound on her right lower extremity following a fall but this has largely healed and is scabbed over at this time.  She denies any significant claudication-like issues.  She does continue to experience some swelling in the left lower extremity after her most recent intervention.  Today noninvasive studies show an ABI of 1.06 on the right and 1.08 on the left.  Previously her ABI was 0.97 bilaterally.  She has good toe pressures and TBI's bilaterally.  Left lower extremity arterial duplex shows primarily biphasic waveforms throughout with patent stent.  She has triphasic tibial waveforms on the right with normal toe waveforms bilaterally.    Review of Systems  Cardiovascular:  Positive for leg swelling.  Skin:  Positive for wound.  Neurological:  Positive for weakness.  All other systems reviewed and are negative.      Objective:   Physical Exam Vitals reviewed.  HENT:     Head: Normocephalic.  Cardiovascular:     Rate and Rhythm: Normal rate and regular rhythm.  Pulmonary:     Effort: Pulmonary effort is normal. No respiratory distress.     Comments: Home O2 Musculoskeletal:     Left lower leg: Edema present.  Skin:    General: Skin is warm and dry.     Findings: Bruising present.  Neurological:     Mental Status: She is alert and oriented to person, place, and time.     Motor: Weakness present.  Psychiatric:        Mood and Affect: Mood normal.        Behavior: Behavior normal.        Thought Content: Thought content normal.        Judgment: Judgment normal.     BP (!) 162/65   Pulse 72   Resp 15   Wt  90 lb (40.8 kg)   BMI 17.01 kg/m   Past Medical History:  Diagnosis Date   AK (actinic keratosis) 12/08/2020   right pretibia inferior bx proven, LN2 01/10/21   Burping    Chronic airway obstruction, not elsewhere classified    Dyspnea    Dysrhythmia    Macular degeneration (senile) of retina, unspecified    Malignant neoplasm of urinary bladder (HCC) 03/2019   Partial bladder resection and Rad tx's.    Obstructive chronic bronchitis with exacerbation (HCC)    Osteoporosis, unspecified    Oxygen deficiency    Personal history of peptic ulcer disease    SCC (squamous cell carcinoma) 12/08/2020   right pretibia superior, EDC 01/10/21   SCC (squamous cell carcinoma) 07/03/2022   left medial calf, clear 09/25/22   SCC (squamous cell carcinoma) 11/22/2022   right thigh   treated with ED&C   Squamous cell carcinoma in situ (SCCIS) 07/03/2022   right pretibia sccis needs treatment with fluorouracil   Tobacco use disorder    Unspecified essential hypertension    Unspecified glaucoma(365.9)     Social History   Socioeconomic History   Marital status: Married    Spouse name: [name]   Number  of children: 3   Years of education: Not on file   Highest education level: Not on file  Occupational History   Occupation: RETIRED    Employer: RETIRED  Tobacco Use   Smoking status: Former    Current packs/day: 0.00    Average packs/day: 1.3 packs/day for 45.0 years (58.5 ttl pk-yrs)    Types: Cigarettes    Start date: 39    Quit date: 40    Years since quitting: 27.1    Passive exposure: Past   Smokeless tobacco: Never  Vaping Use   Vaping status: Never Used  Substance and Sexual Activity   Alcohol use: Yes    Alcohol/week: 14.0 standard drinks of alcohol    Types: 14 Glasses of wine per week    Comment: 1x/day   Drug use: No   Sexual activity: Yes    Birth control/protection: Post-menopausal  Other Topics Concern   Not on file  Social History Narrative   Tobacco abuse x  45 years   Lives with husband in a home    Social Drivers of Health   Financial Resource Strain: Low Risk  (11/12/2022)   Overall Financial Resource Strain (CARDIA)    Difficulty of Paying Living Expenses: Not hard at all  Food Insecurity: No Food Insecurity (11/12/2022)   Hunger Vital Sign    Worried About Running Out of Food in the Last Year: Never true    Ran Out of Food in the Last Year: Never true  Transportation Needs: No Transportation Needs (11/12/2022)   PRAPARE - Administrator, Civil Service (Medical): No    Lack of Transportation (Non-Medical): No  Physical Activity: Insufficiently Active (11/12/2022)   Exercise Vital Sign    Days of Exercise per Week: 7 days    Minutes of Exercise per Session: 20 min  Stress: No Stress Concern Present (11/12/2022)   [employer] of Occupational Health - Occupational Stress Questionnaire    Feeling of Stress : Not at all  Social Connections: Moderately Isolated (11/12/2022)   Social Connection and Isolation Panel [NHANES]    Frequency of Communication with Friends and Family: More than three times a week    Frequency of Social Gatherings with Friends and Family: Twice a week    Attends Religious Services: Never    Database administrator or Organizations: No    Attends Banker Meetings: Never    Marital Status: Married  Catering manager Violence: Not At Risk (11/12/2022)   Humiliation, Afraid, Rape, and Kick questionnaire    Fear of Current or Ex-Partner: No    Emotionally Abused: No    Physically Abused: No    Sexually Abused: No    Past Surgical History:  Procedure Laterality Date   CATARACT EXTRACTION W/ INTRAOCULAR LENS  IMPLANT, BILATERAL     CYSTOSCOPY WITH STENT PLACEMENT Left 05/22/2019   Procedure: CYSTOSCOPY WITH STENT PLACEMENT;  Surgeon: Sondra Come, MD;  Location: [facility];  Service: Urology;  Laterality: Left;   ELBOW FRACTURE SURGERY Left    LOWER EXTREMITY ANGIOGRAPHY Right 11/20/2021    Procedure: Lower Extremity Angiography;  Surgeon: Annice Needy, MD;  Location: [facility];  Service: Cardiovascular;  Laterality: Right;   LOWER EXTREMITY ANGIOGRAPHY Left 09/03/2022   Procedure: Lower Extremity Angiography;  Surgeon: Annice Needy, MD;  Location: [facility];  Service: Cardiovascular;  Laterality: Left;   LOWER EXTREMITY ANGIOGRAPHY Left 06/03/2023   Procedure: Lower Extremity Angiography;  Surgeon:  Annice Needy, MD;  Location: [facility];  Service: Cardiovascular;  Laterality: Left;   TRANSURETHRAL RESECTION OF BLADDER TUMOR N/A 05/22/2019   Procedure: TRANSURETHRAL RESECTION OF BLADDER TUMOR (TURBT);  Surgeon: Sondra Come, MD;  Location: [facility];  Service: Urology;  Laterality: N/A;    Family History  Adopted: Yes    No Known Allergies     Latest Ref Rng & Units 07/26/2022    4:48 AM 07/25/2022   10:32 AM 05/29/2022   10:35 AM  CBC  WBC 4.0 - 10.5 K/uL 3.1  9.7  5.1   Hemoglobin 12.0 - 15.0 g/dL 9.3  40.3  47.4   Hematocrit 36.0 - 46.0 % 28.8  39.7  38.4   Platelets 150 - 400 K/uL 167  287  217.0       CMP     Component Value Date/Time   NA 141 11/06/2022 0756   NA 141 06/10/2019 1324   K 4.1 11/06/2022 0756   K 4.1 07/23/2012 1536   CL 102 11/06/2022 0756   CO2 30 11/06/2022 0756   GLUCOSE 117 (H) 11/06/2022 0756   BUN 17 11/06/2022 0756   BUN 15 06/10/2019 1324   CREATININE 0.90 09/04/2023 1506   CREATININE 1.26 (H) 12/23/2012 1440   CALCIUM 9.5 11/06/2022 0756   PROT 6.5 11/06/2022 0756   PROT 6.6 06/10/2019 1324   ALBUMIN 4.4 11/06/2022 0756   ALBUMIN 4.5 06/10/2019 1324   AST 20 11/06/2022 0756   ALT 21 11/06/2022 0756   ALKPHOS 57 11/06/2022 0756   BILITOT 0.4 11/06/2022 0756   BILITOT 0.4 06/10/2019 1324   GFR 65.72 11/06/2022 0756   GFRNONAA >60 06/03/2023 0803     No results found.     Assessment & Plan:   1. Peripheral arterial disease with history of revascularization (HCC) (Primary)   Recommend:  The patient does not voice lifestyle limiting changes at this point in time.  Noninvasive studies do not suggest clinically significant change.  No invasive studies, angiography or surgery at this time The patient should continue walking and begin a more formal exercise program.  The patient should continue antiplatelet therapy and aggressive treatment of the lipid abnormalities  No changes in the patient's medications at this time  Continued surveillance is indicated as atherosclerosis is likely to progress with time.    The patient will continue follow up with noninvasive studies as ordered.   2. Essential hypertension, benign Continue antihypertensive medications as already ordered, these medications have been reviewed and there are no changes at this time.  3. HYPERCHOLESTEROLEMIA Continue statin as ordered and reviewed, no changes at this time   Current Outpatient Medications on File Prior to Visit  Medication Sig Dispense Refill   albuterol (PROVENTIL) (2.5 MG/3ML) 0.083% nebulizer solution Take 3 mLs (2.5 mg total) by nebulization every 4 (four) hours as needed for wheezing or shortness of breath. 75 mL 12   albuterol (VENTOLIN HFA) 108 (90 Base) MCG/ACT inhaler Inhale 2 puffs into the lungs every 6 (six) hours as needed for wheezing or shortness of breath. 8 g 6   ascorbic acid (VITAMIN C) 1000 MG tablet Take 1,000 mg by mouth daily.      aspirin EC 81 MG tablet Take 1 tablet (81 mg total) by mouth daily. Swallow whole. 150 tablet 2   atorvastatin (LIPITOR) 10 MG tablet TAKE 1 TABLET EVERY DAY 90 tablet 3   Azelastine HCl 137 MCG/SPRAY SOLN PLACE 2 SPRAYS INTO BOTH  NOSTRILS 2 (TWO) TIMES DAILY. USE IN EACH NOSTRIL AS DIRECTED 100 mL 5   Budeson-Glycopyrrol-Formoterol (BREZTRI AEROSPHERE) 160-9-4.8 MCG/ACT AERO Inhale 2 puffs into the lungs 2 (two) times daily.     Cholecalciferol (VITAMIN D) 125 MCG (5000 UT) CAPS Take 5,000 Units by mouth daily.     clopidogrel  (PLAVIX) 75 MG tablet TAKE 1 TABLET EVERY DAY 90 tablet 3   docusate sodium (COLACE) 100 MG capsule Take 100 mg by mouth 2 (two) times daily.     guaiFENesin (MUCINEX) 600 MG 12 hr tablet Take 600 mg by mouth 2 (two) times daily.     losartan (COZAAR) 25 MG tablet 25 mg daily.      Multiple Vitamins-Minerals (PRESERVISION AREDS) TABS Take 1 tablet by mouth 2 (two) times daily.     OXYGEN Inhale 4 L into the lungs daily in the afternoon.     verapamil (VERELAN PM) 360 MG 24 hr capsule Take 360 mg by mouth daily.     Vitamin E 180 MG CAPS Take 180 mg by mouth daily.     Fluticasone-Umeclidin-Vilant (TRELEGY ELLIPTA) 200-62.5-25 MCG/ACT AEPB Inhale 1 Act into the lungs daily. (Patient not taking: Reported on 10/09/2023) 1 each 5   No current facility-administered medications on file prior to visit.    There are no Patient Instructions on file for this visit. No follow-ups on file.   Georgiana Spinner, NP

## 2023-10-11 LAB — VAS US ABI WITH/WO TBI
Left ABI: 1.08
Right ABI: 1.06

## 2023-10-14 ENCOUNTER — Encounter: Payer: Self-pay | Admitting: Dermatology

## 2023-10-14 ENCOUNTER — Ambulatory Visit (INDEPENDENT_AMBULATORY_CARE_PROVIDER_SITE_OTHER): Payer: Medicare HMO | Admitting: Dermatology

## 2023-10-14 DIAGNOSIS — Z86007 Personal history of in-situ neoplasm of skin: Secondary | ICD-10-CM | POA: Diagnosis not present

## 2023-10-14 DIAGNOSIS — W908XXA Exposure to other nonionizing radiation, initial encounter: Secondary | ICD-10-CM | POA: Diagnosis not present

## 2023-10-14 DIAGNOSIS — R234 Changes in skin texture: Secondary | ICD-10-CM | POA: Diagnosis not present

## 2023-10-14 DIAGNOSIS — Q828 Other specified congenital malformations of skin: Secondary | ICD-10-CM

## 2023-10-14 DIAGNOSIS — D692 Other nonthrombocytopenic purpura: Secondary | ICD-10-CM | POA: Diagnosis not present

## 2023-10-14 DIAGNOSIS — L578 Other skin changes due to chronic exposure to nonionizing radiation: Secondary | ICD-10-CM

## 2023-10-14 NOTE — Patient Instructions (Addendum)
 Recommend daily broad spectrum sunscreen SPF 30+ to sun-exposed areas, reapply every 2 hours as needed. Call for new or changing lesions.  Staying in the shade or wearing long sleeves, sun glasses (UVA+UVB protection) and wide brim hats (4-inch brim around the entire circumference of the hat) are also recommended for sun protection.    Recommend starting moisturizer with exfoliant (Urea, Salicylic acid, or Lactic acid) one to two times daily to help smooth rough and bumpy skin.  OTC options include Cetaphil Rough and Bumpy lotion (Urea), Eucerin Roughness Relief lotion or spot treatment cream (Urea), CeraVe SA lotion/cream for Rough and Bumpy skin (Sal Acid), Gold Bond Rough and Bumpy cream (Sal Acid), and AmLactin 12% lotion/cream (Lactic Acid).  If applying in morning, also apply sunscreen to sun-exposed areas, since these exfoliating moisturizers can increase sensitivity to sun.   Dermlite Lumio 2 Available at  [email or web address]     Due to recent changes in healthcare laws, you may see results of your pathology and/or laboratory studies on MyChart before the doctors have had a chance to review them. We understand that in some cases there may be results that are confusing or concerning to you. Please understand that not all results are received at the same time and often the doctors may need to interpret multiple results in order to provide you with the best plan of care or course of treatment. Therefore, we ask that you please give [place] 2 business days to thoroughly review all your results before contacting the office for clarification. Should we see a critical lab result, you will be contacted sooner.   If You Need Anything After Your Visit  If you have any questions or concerns for your doctor, please call our main line at [phone] and press option 4 to reach your doctor's medical assistant. If no one answers, please leave a voicemail as directed and we will return your  call as soon as possible. Messages left after 4 pm will be answered the following business day.   You may also send [place] a message via MyChart. We typically respond to MyChart messages within 1-2 business days.  For prescription refills, please ask your pharmacy to contact our office. Our fax number is [phone].  If you have an urgent issue when the clinic is closed that cannot wait until the next business day, you can page your doctor at the number below.    Please note that while we do our best to be available for urgent issues outside of office hours, we are not available 24/7.   If you have an urgent issue and are unable to reach [place], you may choose to seek medical care at your doctor's office, retail clinic, urgent care center, or emergency room.  If you have a medical emergency, please immediately call 911 or go to the emergency department.  Pager Numbers  - Dr. Gwen Pounds: [phone]  - Dr. Roseanne Reno: [phone]  - Dr. Katrinka Blazing: [phone]   In the event of inclement weather, please call our main line at [phone] for an update on the status of any delays or closures.  Dermatology Medication Tips: Please keep the boxes that topical medications come in in order to help keep track of the instructions about where and how to use these. Pharmacies typically print the medication instructions only on the boxes and not directly on the medication tubes.   If your medication is too expensive, please contact our office at [phone] option 4 or send [place] a message through MyChart.  We are unable to tell what your co-pay for medications will be in advance as this is different depending on your insurance coverage. However, we may be able to find a substitute medication at lower cost or fill out paperwork to get insurance to cover a needed medication.   If a prior authorization is required to get your medication covered by your insurance company, please allow [place] 1-2 business days to  complete this process.  Drug prices often vary depending on where the prescription is filled and some pharmacies may offer cheaper prices.  The website www.goodrx.com contains coupons for medications through different pharmacies. The prices here do not account for what the cost may be with help from insurance (it may be cheaper with your insurance), but the website can give you the price if you did not use any insurance.  - You can print the associated coupon and take it with your prescription to the pharmacy.  - You may also stop by our office during regular business hours and pick up a GoodRx coupon card.  - If you need your prescription sent electronically to a different pharmacy, notify our office through [facility] [place] or by phone at [phone] option 4.     Si Usted Necesita Algo Despus de Su Visita  Tambin puede enviarnos un mensaje a travs de Clinical cytogeneticist. Por lo general respondemos a los mensajes de MyChart en el transcurso de 1 a 2 das hbiles.  Para renovar recetas, por favor pida a su farmacia que se ponga en contacto con nuestra oficina. Annie Sable de fax es [place] [phone].  Si tiene un asunto urgente cuando la clnica est cerrada y que no puede esperar hasta el siguiente da hbil, puede llamar/localizar a su doctor(a) al nmero que aparece a continuacin.   Por favor, tenga en cuenta que aunque hacemos todo lo posible para estar disponibles para asuntos urgentes fuera del horario de [place], no estamos disponibles las 24 horas del da, los 7 [address]  [address] de la [place].   Si tiene un problema urgente y no puede comunicarse con nosotros, puede optar por buscar atencin mdica  en el consultorio de su doctor(a), en una clnica privada, en un centro de atencin urgente o en una sala de emergencias.  Si tiene Engineer, drilling, por favor llame inmediatamente al 911 o vaya a la sala de emergencias.  Nmeros de bper  - Dr. Gwen Pounds: [phone]  - Dra. Roseanne Reno:  [phone]  - Dr. Katrinka Blazing: [phone]   En caso de inclemencias del tiempo, por favor llame a Lacy Duverney principal al [phone] para una actualizacin sobre el [place] de cualquier retraso o cierre.  Consejos para la medicacin en dermatologa: Por favor, guarde las cajas en las que vienen los medicamentos de uso tpico para ayudarle a seguir las instrucciones sobre dnde y cmo usarlos. Las farmacias generalmente imprimen las instrucciones del medicamento slo en las cajas y no directamente en los tubos del [place].   Si su medicamento es muy caro, por favor, pngase en contacto con [facility] [place] llamando al [phone] y presione la opcin 4 o envenos un mensaje a travs de Clinical cytogeneticist.   No podemos decirle cul ser su copago por los medicamentos por adelantado ya que esto es diferente dependiendo de la cobertura de su seguro. Sin embargo, es posible que podamos encontrar un medicamento sustituto a Audiological scientist un formulario para que el seguro cubra el medicamento que se considera necesario.   Si se requiere una autorizacin previa para que  su compaa de seguros [place] su medicamento, por favor permtanos de 1 a 2 das hbiles para completar [address]  Los precios de los medicamentos varan con frecuencia dependiendo del Environmental consultant de dnde se surte la receta y alguna farmacias pueden ofrecer precios ms baratos.  El sitio web www.goodrx.com tiene cupones para medicamentos de Health and safety inspector. Los precios aqu no tienen en cuenta lo que podra costar con la ayuda del seguro (puede ser ms barato con su seguro), pero el sitio web puede darle el precio si no utiliz Tourist information centre manager.  - Puede imprimir el cupn correspondiente y llevarlo con su receta a la farmacia.  - Tambin puede pasar por nuestra oficina durante el horario de atencin regular y Education officer, museum una tarjeta de cupones de GoodRx.  - Si necesita que su receta se enve electrnicamente a una farmacia diferente, informe  a nuestra oficina a travs de MyChart de [place] o por telfono llamando al [phone] y presione la opcin 4.

## 2023-10-14 NOTE — Progress Notes (Signed)
   Follow-Up Visit   Subjective  Misty Ferguson is a 89 y.o. female who presents for the following: 6 month follow up of biopsy proven SCCs on B/L anterior lower legs. Lesions were concerning for recurrence at 04/16/2023 visit. Patient opted for observation.  Patient states she has been having vascular problems with her legs since last visit, but not related to these sites. PAD/PVD diagnosis.   The patient has spots, moles and lesions to be evaluated, some may be new or changing and the patient may have concern these could be cancer.  Husband is with patient and contributes to history.   The following portions of the chart were reviewed this encounter and updated as appropriate: medications, allergies, medical history  Review of Systems:  No other skin or systemic complaints except as noted in HPI or Assessment and Plan.  Objective  Well appearing patient in no apparent distress; mood and affect are within normal limits.  A focused examination was performed of the following areas: B/L legs  Relevant exam findings are noted in the Assessment and Plan.         Assessment & Plan   Purpura - Chronic; persistent and recurrent.  Treatable, but not curable. - Violaceous macules and patches at legs - Benign - Related to trauma, age, sun damage and/or use of blood thinners, chronic use of topical and/or oral steroids - Observe - Can use OTC arnica containing moisturizer such as Dermend Bruise Formula if desired - Call for worsening or other concerns   Eschar, secondary to fall  Exam: thickened hemorrhagic crusting at right lateral knee.  Treatment: Cleanse daily with soap and water. Apply Vaseline Jelly, cover with band-aid.    ACTINIC DAMAGE - chronic, secondary to cumulative UV radiation exposure/sun exposure over time - diffuse scaly erythematous macules with underlying dyspigmentation - Recommend daily broad spectrum sunscreen SPF 30+ to sun-exposed areas, reapply every  2 hours as needed.  - Recommend staying in the shade or wearing long sleeves, sun glasses (UVA+UVB protection) and wide brim hats (4-inch brim around the entire circumference of the hat). - Call for new or changing lesions.  HISTORY OF SQUAMOUS CELL CARCINOMA IN SITU OF THE SKIN. B/L lower legs. - No evidence of recurrence today - Recommend regular full body skin exams - Recommend daily broad spectrum sunscreen SPF 30+ to sun-exposed areas, reapply every 2 hours as needed.  - Call if any new or changing lesions are noted between office visits  - worrisome lesions at last visit have improved  Keratoderma  Exam: thickened keratotic skin on B/L legs  Treatment:  Recommend using CeraVe SA cream. Samples of CeraVe SA cream and Amlactin 15% cream given.  SOLAR PURPURA (HCC)   ACTINIC ELASTOSIS   SCAB   KERATODERMA    Return in about 6 months (around 04/15/2024) for recheck SCCis lower legs.  I, Lawson Radar, CMA, am acting as scribe for Elie Goody, MD.   Documentation: I have reviewed the above documentation for accuracy and completeness, and I agree with the above.  Elie Goody, MD

## 2023-10-25 ENCOUNTER — Telehealth: Payer: Self-pay | Admitting: *Deleted

## 2023-10-25 DIAGNOSIS — E7800 Pure hypercholesterolemia, unspecified: Secondary | ICD-10-CM

## 2023-10-25 DIAGNOSIS — R7303 Prediabetes: Secondary | ICD-10-CM

## 2023-10-25 NOTE — Telephone Encounter (Signed)
-----   Message from Lovena Neighbours sent at 10/25/2023 10:58 AM EDT ----- Regarding: Labs for Friday 4.4.25 Please put fasting physical lab orders in future. Thank you, Denny Peon

## 2023-11-15 ENCOUNTER — Other Ambulatory Visit (INDEPENDENT_AMBULATORY_CARE_PROVIDER_SITE_OTHER): Payer: Medicare HMO

## 2023-11-15 ENCOUNTER — Encounter: Payer: Self-pay | Admitting: Family Medicine

## 2023-11-15 DIAGNOSIS — R7303 Prediabetes: Secondary | ICD-10-CM

## 2023-11-15 DIAGNOSIS — E7800 Pure hypercholesterolemia, unspecified: Secondary | ICD-10-CM | POA: Diagnosis not present

## 2023-11-15 LAB — LIPID PANEL
Cholesterol: 183 mg/dL (ref 0–200)
HDL: 103.6 mg/dL (ref >39.00)
LDL Cholesterol: 69 mg/dL (ref 0–99)
NonHDL: 79.55
Total CHOL/HDL Ratio: 2
Triglycerides: 54 mg/dL (ref 0.0–149.0)
VLDL: 10.8 mg/dL (ref 0.0–40.0)

## 2023-11-15 LAB — COMPREHENSIVE METABOLIC PANEL WITH GFR
ALT: 17 U/L (ref 0–35)
AST: 15 U/L (ref 0–37)
Albumin: 4.1 g/dL (ref 3.5–5.2)
Alkaline Phosphatase: 87 U/L (ref 39–117)
BUN: 18 mg/dL (ref 6–23)
CO2: 29 meq/L (ref 19–32)
Calcium: 9.3 mg/dL (ref 8.4–10.5)
Chloride: 101 meq/L (ref 96–112)
Creatinine, Ser: 0.72 mg/dL (ref 0.40–1.20)
GFR: 75.16 mL/min (ref >60.00)
Glucose, Bld: 124 mg/dL — ABNORMAL HIGH (ref 70–99)
Potassium: 4.9 meq/L (ref 3.5–5.1)
Sodium: 139 meq/L (ref 135–145)
Total Bilirubin: 0.4 mg/dL (ref 0.2–1.2)
Total Protein: 6.3 g/dL (ref 6.0–8.3)

## 2023-11-15 LAB — HEMOGLOBIN A1C: Hgb A1c MFr Bld: 5.6 % (ref 4.6–6.5)

## 2023-11-15 NOTE — Progress Notes (Signed)
 No critical labs need to be addressed urgently. We will discuss labs in detail at upcoming office visit.

## 2023-11-22 ENCOUNTER — Ambulatory Visit: Payer: Medicare HMO | Admitting: Family Medicine

## 2023-11-22 ENCOUNTER — Encounter: Payer: Self-pay | Admitting: Family Medicine

## 2023-11-22 VITALS — BP 138/70 | HR 78 | Temp 98.4°F | Ht 61.0 in | Wt 88.0 lb

## 2023-11-22 DIAGNOSIS — J432 Centrilobular emphysema: Secondary | ICD-10-CM

## 2023-11-22 DIAGNOSIS — E43 Unspecified severe protein-calorie malnutrition: Secondary | ICD-10-CM | POA: Diagnosis not present

## 2023-11-22 DIAGNOSIS — Z0000 Encounter for general adult medical examination without abnormal findings: Secondary | ICD-10-CM

## 2023-11-22 DIAGNOSIS — G6289 Other specified polyneuropathies: Secondary | ICD-10-CM

## 2023-11-22 DIAGNOSIS — I739 Peripheral vascular disease, unspecified: Secondary | ICD-10-CM

## 2023-11-22 DIAGNOSIS — J9621 Acute and chronic respiratory failure with hypoxia: Secondary | ICD-10-CM

## 2023-11-22 DIAGNOSIS — J9611 Chronic respiratory failure with hypoxia: Secondary | ICD-10-CM

## 2023-11-22 DIAGNOSIS — E7800 Pure hypercholesterolemia, unspecified: Secondary | ICD-10-CM

## 2023-11-22 DIAGNOSIS — D692 Other nonthrombocytopenic purpura: Secondary | ICD-10-CM

## 2023-11-22 NOTE — Assessment & Plan Note (Signed)
Chronic, stable lipid control important in reducing the progression of atherosclerotic disease. Continue statin therapy  Atorvastatin 10 mg p.o. daily

## 2023-11-22 NOTE — Assessment & Plan Note (Signed)
Chronic, severe ? ?Followed by pulmonary on controllers and rescue medication. ?

## 2023-11-22 NOTE — Assessment & Plan Note (Signed)
Chronic,  follwoed by vascular.  On plavix

## 2023-11-22 NOTE — Progress Notes (Addendum)
 Patient ID: Misty Ferguson, female    DOB: 03-22-1934, 89 y.o.   MRN: [id number]  This visit was conducted in person.  BP (!) 150/70 (BP Location: Left Arm, Patient Position: Sitting, Cuff Size: Normal)   Pulse 78   Temp 98.4 F (36.9 C) (Temporal)   Ht 5\' 1"  (1.549 m)   Wt 88 lb (39.9 kg) Comment: Patient reported  SpO2 97% Comment: 4 L O2  BMI 16.63 kg/m    CC:  Chief Complaint  Patient presents with   Annual Exam    Subjective:   HPI: Misty Ferguson is a 89 y.o. female presenting on 11/22/2023 for Annual Exam  The patient presents for annual medicare wellness, complete physical and review of chronic health problems. He/She also has the following acute concerns today:  I have personally reviewed the Medicare Annual Wellness questionnaire and have noted 1. The patient's medical and social history 2. Their use of alcohol, tobacco or illicit drugs 3. Their current medications and supplements 4. The patient's functional ability including ADL's, fall risks, home safety risks and hearing or visual             impairment. 5. Diet and physical activities 6. Evidence for depression or mood disorders 7.         Updated provider list Cognitive evaluation was performed and recorded on pt medicare questionnaire form. The patients weight, height, BMI and visual acuity have been recorded in the chart   I have made referrals, counseling and provided education to the patient based review of the above and I have provided the pt with a written personalized care plan for preventive services.   Documentation of this information was scanned into the electronic record under the media tab.   Advance directives and end of life planning reviewed in detail with patient and documented in EMR. Patient given handout on advance care directives if needed. HCPOA and living will updated if needed.  Screening and hearing eval performed.  No falls in last 12 months.  Also mobility assessment  performed while she was in the office.  Patient is limited weightbearing and primarily uses a manual wheelchair to perform ADLs. Patient suffers from severe COPD and PAD which impairs her ability to perform daily activities like bathing and toileting in the home.  A cane, crutch, or walker will not resolve issue with performing activities of daily living. A wheelchair will allow patient to safely perform daily activities. Patient can safely propel the wheelchair in the home and has a caregiver who can provide assistance.     PVD bilateral legs.. now seeing  vascular. Right worse than left.  Has angiogram planned.  She is having right heel.. pain causing pain  keeping her up at night.  Hydrocodone  5/325 mg 1 tablet minimal help.   COPD, severe Stable  given minimally active.  On continuous oxygen .   No chest pain.   Good water intake  Elevated Cholesterol:  LDL at goal < 70 on atorvastatin  10 mg daily Lab Results  Component Value Date   CHOL 183 11/15/2023   HDL 103.60 11/15/2023   LDLCALC 69 11/15/2023   LDLDIRECT 99.1 09/08/2013   TRIG 54.0 11/15/2023   CHOLHDL 2 11/15/2023  Using medications without problems: none Muscle aches:  none Diet compliance:  good Exercise:limited given COPD Other complaints:    Prediabetes;  Lab Results  Component Value Date   HGBA1C 5.6 11/15/2023     Hypertension:  Stable control on current  medications losartan  and verapamil LA BP Readings from Last 3 Encounters:  11/22/23 (!) 150/70  10/09/23 (!) 162/65  09/10/23 (!) 114/57  Using medication without problems or lightheadedness: none Chest pain with exertion:none Edema:none Short of breath: yes Average home BPs: not checking Other issues:    Hx of bladder cancer S/P radiation... followed by [facility]. Relevant past medical, surgical, family and social history reviewed and updated as indicated. Interim medical history since our last visit reviewed. Allergies and medications  reviewed and updated. Outpatient Medications Prior to Visit  Medication Sig Dispense Refill   albuterol  (PROVENTIL ) (2.5 MG/3ML) 0.083% nebulizer solution Take 3 mLs (2.5 mg total) by nebulization every 4 (four) hours as needed for wheezing or shortness of breath. 75 mL 12   albuterol  (VENTOLIN  HFA) 108 (90 Base) MCG/ACT inhaler Inhale 2 puffs into the lungs every 6 (six) hours as needed for wheezing or shortness of breath. 8 g 6   ascorbic acid (VITAMIN C) 1000 MG tablet Take 1,000 mg by mouth daily.      aspirin  EC 81 MG tablet Take 1 tablet (81 mg total) by mouth daily. Swallow whole. 150 tablet 2   atorvastatin  (LIPITOR) 10 MG tablet TAKE 1 TABLET EVERY DAY 90 tablet 3   Azelastine  HCl 137 MCG/SPRAY SOLN PLACE 2 SPRAYS INTO BOTH NOSTRILS 2 (TWO) TIMES DAILY. USE IN EACH NOSTRIL AS DIRECTED 100 mL 5   Budeson-Glycopyrrol-Formoterol  (BREZTRI  AEROSPHERE) 160-9-4.8 MCG/ACT AERO Inhale 2 puffs into the lungs 2 (two) times daily.     Cholecalciferol  (VITAMIN D ) 125 MCG (5000 UT) CAPS Take 5,000 Units by mouth daily.     clopidogrel  (PLAVIX ) 75 MG tablet TAKE 1 TABLET EVERY DAY 90 tablet 3   docusate sodium  (COLACE) 100 MG capsule Take 100 mg by mouth 2 (two) times daily.     guaiFENesin (MUCINEX) 600 MG 12 hr tablet Take 600 mg by mouth 2 (two) times daily.     losartan  (COZAAR ) 25 MG tablet 25 mg daily.      Multiple Vitamins-Minerals (PRESERVISION AREDS) TABS Take 1 tablet by mouth 2 (two) times daily.     OXYGEN  Inhale 4 L into the lungs daily in the afternoon.     verapamil (VERELAN PM) 360 MG 24 hr capsule Take 360 mg by mouth daily.     Vitamin E 180 MG CAPS Take 180 mg by mouth daily.     Fluticasone -Umeclidin-Vilant (TRELEGY ELLIPTA ) 200-62.5-25 MCG/ACT AEPB Inhale 1 Act into the lungs daily. (Patient not taking: Reported on 10/09/2023) 1 each 5   No facility-administered medications prior to visit.     Per HPI unless specifically indicated in ROS section below Review of Systems   Constitutional:  Negative for fatigue and fever.  HENT:  Negative for congestion.   Eyes:  Negative for pain.  Respiratory:  Negative for cough and shortness of breath.   Cardiovascular:  Negative for chest pain, palpitations and leg swelling.  Gastrointestinal:  Negative for abdominal pain.  Genitourinary:  Negative for dysuria and vaginal bleeding.  Musculoskeletal:  Negative for back pain.  Neurological:  Negative for syncope, light-headedness and headaches.  Psychiatric/Behavioral:  Negative for dysphoric mood.    Objective:  BP (!) 150/70 (BP Location: Left Arm, Patient Position: Sitting, Cuff Size: Normal)   Pulse 78   Temp 98.4 F (36.9 C) (Temporal)   Ht 5\' 1"  (1.549 m)   Wt 88 lb (39.9 kg) Comment: Patient reported  SpO2 97% Comment: 4 L O2  BMI 16.63 kg/m  Wt Readings from Last 3 Encounters:  11/22/23 88 lb (39.9 kg)  10/09/23 90 lb (40.8 kg)  10/07/23 90 lb (40.8 kg)      MMSE nml in office today... good short term memory and executive funcitoning.    Physical Exam Vitals and nursing note reviewed.  Constitutional:      General: She is not in acute distress.    Appearance: Normal appearance. She is well-developed. She is not ill-appearing or toxic-appearing.  HENT:     Head: Normocephalic.     Right Ear: Hearing, tympanic membrane, ear canal and external ear normal. Tympanic membrane is not erythematous, retracted or bulging.     Left Ear: Hearing, tympanic membrane, ear canal and external ear normal. Tympanic membrane is not erythematous, retracted or bulging.     Nose: Nose normal. No mucosal edema or rhinorrhea.     Right Sinus: No maxillary sinus tenderness or frontal sinus tenderness.     Left Sinus: No maxillary sinus tenderness or frontal sinus tenderness.     Mouth/Throat:     Pharynx: Uvula midline.  Eyes:     General: Lids are normal. Lids are everted, no foreign bodies appreciated.     Conjunctiva/sclera: Conjunctivae normal.     Pupils: Pupils  are equal, round, and reactive to light.  Neck:     Thyroid : No thyroid  mass or thyromegaly.     Vascular: No carotid bruit.     Trachea: Trachea normal.  Cardiovascular:     Rate and Rhythm: Normal rate and regular rhythm.     Pulses: Decreased pulses.          Dorsalis pedis pulses are 1+ on the right side and 1+ on the left side.       Posterior tibial pulses are 0 on the right side and 0 on the left side.     Heart sounds: Normal heart sounds, S1 normal and S2 normal. No murmur heard.    No friction rub. No gallop.     Comments: Left leg colder than right Pulmonary:     Effort: Pulmonary effort is normal. No tachypnea or respiratory distress.     Breath sounds: Normal breath sounds. No decreased breath sounds, wheezing, rhonchi or rales.  Abdominal:     General: Bowel sounds are normal. There is no distension or abdominal bruit.     Palpations: Abdomen is soft. There is no fluid wave or mass.     Tenderness: There is no abdominal tenderness. There is no guarding or rebound.     Hernia: No hernia is present.  Musculoskeletal:     Cervical back: Normal range of motion and neck supple.     Right lower leg: No edema.     Left lower leg: No edema.  Lymphadenopathy:     Cervical: No cervical adenopathy.  Skin:    General: Skin is warm and dry.     Findings: No rash.     Comments:  Chronic skin changes in bilateral legs, senile purpura and hyperpigmented skin  Neurological:     Mental Status: She is alert.     Cranial Nerves: No cranial nerve deficit.     Sensory: No sensory deficit.  Psychiatric:        Mood and Affect: Mood is not anxious or depressed.        Speech: Speech normal.        Behavior: Behavior normal. Behavior is cooperative.        Thought Content: Thought  content normal.        Judgment: Judgment normal.       Results for orders placed or performed in visit on 11/15/23  Comprehensive metabolic panel   Collection Time: 11/15/23  7:51 AM  Result Value Ref  Range   Sodium 139 135 - 145 mEq/L   Potassium 4.9 3.5 - 5.1 mEq/L   Chloride 101 96 - 112 mEq/L   CO2 29 19 - 32 mEq/L   Glucose, Bld 124 (H) 70 - 99 mg/dL   BUN 18 6 - 23 mg/dL   Creatinine, Ser 8.11 0.40 - 1.20 mg/dL   Total Bilirubin 0.4 0.2 - 1.2 mg/dL   Alkaline Phosphatase 87 39 - 117 U/L   AST 15 0 - 37 U/L   ALT 17 0 - 35 U/L   Total Protein 6.3 6.0 - 8.3 g/dL   Albumin 4.1 3.5 - 5.2 g/dL   GFR 91.47 >82.95 mL/min   Calcium  9.3 8.4 - 10.5 mg/dL  Hemoglobin A2Z   Collection Time: 11/15/23  7:51 AM  Result Value Ref Range   Hgb A1c MFr Bld 5.6 4.6 - 6.5 %  Lipid panel   Collection Time: 11/15/23  7:51 AM  Result Value Ref Range   Cholesterol 183 0 - 200 mg/dL   Triglycerides 30.8 0.0 - 149.0 mg/dL   HDL 657.84 >69.62 mg/dL   VLDL 95.2 0.0 - 84.1 mg/dL   LDL Cholesterol 69 0 - 99 mg/dL   Total CHOL/HDL Ratio 2    NonHDL 79.55     This visit occurred during the SARS-CoV-2 public health emergency.  Safety protocols were in place, including screening questions prior to the visit, additional usage of staff PPE, and extensive cleaning of exam room while observing appropriate contact time as indicated for disinfecting solutions.   COVID 19 screen:  No recent travel or known exposure to COVID19 The patient denies respiratory symptoms of COVID 19 at this time. The importance of social distancing was discussed today.   Assessment and Plan   The patient's preventative maintenance and recommended screening tests for an annual wellness exam were reviewed in full today. Brought up to date unless services declined.  Counselled on the importance of diet, exercise, and its role in overall health and mortality. The patient's FH and SH was reviewed, including their home life, tobacco status, and drug and alcohol status.   Flowsheet Row Office Visit from 11/22/2023 in [facility]  PHQ-2 Total Score 0        Vaccines:Uptodate. Encouraged COVID  vaccine Mammogram: last in 2009, not interested in continuing to follow. No longer indicated DEXA: on fosamax (many years about 10 years) last checked 2009 t -3.11.Aaron Aas Stopped fosamax 2012,    05/2016 femoral neck  -2.7  After discussion.. she has chosen not to continue bone densities as she would not use a med to treat... again discussed and declined in 2025 Colon: Not interested in colon cancer screening... no longer indicated Discussed the risks and benefits of cancer screening again in 2025... Pt voices understanding an chooses to not proceed with prevention as noted above.   " I am ready to go if the lord takes me" No indication for pap/DVE.    Problem List Items Addressed This Visit     Acute on chronic respiratory failure with hypoxia (HCC)    Chronic, on continuous oxygen  followed by Pulmonary.      Centrilobular emphysema (HCC)   Chronic, severe  Followed by pulmonary on controllers and rescue medication.      Chronic hypoxemic respiratory failure (HCC)   Chronic, stable on 4 L oxygen       HYPERCHOLESTEROLEMIA    Chronic, stable lipid control important in reducing the progression of atherosclerotic disease. Continue statin therapy  Atorvastatin  10 mg p.o. daily       Peripheral neuropathy   Moderate control.. Not currently interested in trail of lyrica  or higher dose gabapentin .      PVD (peripheral vascular disease) (HCC)   Chronic,  follwoed by vascular.  On plavix        Senile purpura (HCC)   Chronic, stable        Other Visit Diagnoses       Medicare annual wellness visit, subsequent    -  Primary       Herby Lolling, MD

## 2023-11-22 NOTE — Assessment & Plan Note (Signed)
 Wt Readings from Last 3 Encounters:  11/22/23 88 lb (39.9 kg)  10/09/23 90 lb (40.8 kg)  10/07/23 90 lb (40.8 kg)   She feels that she has a good appetite  Has had further weight loss.

## 2023-11-22 NOTE — Assessment & Plan Note (Signed)
Chronic, stable on 4 L oxygen ?

## 2023-11-22 NOTE — Assessment & Plan Note (Signed)
 Chronic, stable

## 2023-11-22 NOTE — Assessment & Plan Note (Signed)
Moderate control.. Not currently interested in trail of lyrica or higher dose gabapentin.

## 2023-11-22 NOTE — Assessment & Plan Note (Signed)
 Chronic, on continuous oxygen followed by Pulmonary.

## 2023-11-27 ENCOUNTER — Encounter: Payer: Self-pay | Admitting: Family Medicine

## 2023-11-27 DIAGNOSIS — I739 Peripheral vascular disease, unspecified: Secondary | ICD-10-CM

## 2023-11-27 DIAGNOSIS — J432 Centrilobular emphysema: Secondary | ICD-10-CM

## 2023-11-27 NOTE — Telephone Encounter (Signed)
 Okay to order new chair?

## 2023-12-02 ENCOUNTER — Ambulatory Visit: Admitting: Internal Medicine

## 2023-12-02 ENCOUNTER — Encounter: Payer: Self-pay | Admitting: Internal Medicine

## 2023-12-02 ENCOUNTER — Telehealth: Payer: Self-pay

## 2023-12-02 VITALS — BP 160/80 | HR 74 | Temp 97.7°F | Ht 61.0 in | Wt 88.0 lb

## 2023-12-02 DIAGNOSIS — J449 Chronic obstructive pulmonary disease, unspecified: Secondary | ICD-10-CM

## 2023-12-02 DIAGNOSIS — J9611 Chronic respiratory failure with hypoxia: Secondary | ICD-10-CM

## 2023-12-02 DIAGNOSIS — J439 Emphysema, unspecified: Secondary | ICD-10-CM

## 2023-12-02 DIAGNOSIS — R0689 Other abnormalities of breathing: Secondary | ICD-10-CM

## 2023-12-02 MED ORDER — ALBUTEROL SULFATE (2.5 MG/3ML) 0.083% IN NEBU: 2.5000 mg | INHALATION_SOLUTION | RESPIRATORY_TRACT | 12 refills | Status: DC | PRN

## 2023-12-02 MED ORDER — OHTUVAYRE 3 MG/2.5ML IN SUSP: 1.0000 | Freq: Two times a day (BID) | RESPIRATORY_TRACT | 1 refills | Status: DC

## 2023-12-02 MED ORDER — BREZTRI AEROSPHERE 160-9-4.8 MCG/ACT IN AERO: 2.0000 | INHALATION_SPRAY | Freq: Two times a day (BID) | RESPIRATORY_TRACT | 12 refills | Status: DC

## 2023-12-02 MED ORDER — ALBUTEROL SULFATE HFA 108 (90 BASE) MCG/ACT IN AERS: 2.0000 | INHALATION_SPRAY | Freq: Four times a day (QID) | RESPIRATORY_TRACT | 6 refills | Status: AC | PRN

## 2023-12-02 MED ORDER — BREZTRI AEROSPHERE 160-9-4.8 MCG/ACT IN AERO: 2.0000 | INHALATION_SPRAY | Freq: Two times a day (BID) | RESPIRATORY_TRACT | Status: AC

## 2023-12-02 NOTE — Telephone Encounter (Signed)
 Are you able to add a note to her CPE on 11/22/2023 about wheelchair use or do we need to bring her in for a separate appointment??

## 2023-12-02 NOTE — Patient Instructions (Signed)
 Continue oxygen  as prescribed  Will try new nebulizer therapy to help with breathing  Continue Breztri  as prescribed Continue nebulized therapy as prescribed  Continue flutter valve as prescribed  We will try chest vest physiotherapy for chronic mucus production   Avoid Allergens and Irritants Avoid secondhand smoke Avoid SICK contacts Recommend  Masking  when appropriate Recommend Keep up-to-date with vaccinations

## 2023-12-02 NOTE — Progress Notes (Addendum)
 [facility] Pulmonary Medicine Consultation     Date: 12/02/2023,   MRN# [id number] Misty Ferguson Aug 11, 1934      Synopsis: Misty Ferguson was first seen by the [facility] pulmonary clinic in the summer of 2013. She has COPD. Simple spirometry performed in clinic showed an FEV1 of 45% predicted with clear obstruction.  She smoked 2 pack/day x 40 years, quit  17 years ago according to patient. She is a frequent exacerbation phenotype. She uses 2 L of oxygen  continuously with exertion and at night   lling, mild erythema CC  Follow-up assessment for COPD Follow-up assessment for severe respiratory insufficiency Follow-up assessment for oxygen  therapy  HPI No exacerbation at this time No evidence of heart failure at this time No evidence or signs of infection at this time No respiratory distress No fevers, chills, nausea, vomiting, diarrhea No evidence of lower extremity edema No evidence hemoptysis   Diagnosed with COVID July 2022 Declining ever since then  S/p VASC procedure  Chronic Hypoxic resp failure due to COPD -Patient benefits from oxygen  therapy 4L [place]  -recommend using oxygen  as prescribed -patient needs this for survival   Patient diagnosed with bladder cancer sees urology  Chronic shortness of breath and dyspnea exertion Stable over the last several years   Severe respiratory insufficiency Debilitating shortness of breath 4 L nasal cannula Requires oxygen  24/7    Patient has excessive mucus production despite flutter valve She does flutter valve 10 x 10 times a day, despite flutter valve patient has extensive mucus production Patient has chronic productive cough more than 6 months I have discussed with her possible chest physiotherapy CT chest confirms bilateral bronchiectasis October 14, 2020    o signs of CHF or lower ext swelling at t ssues, has chCurrent Medication:   Current Outpatient Medications:    albuterol  (PROVENTIL ) (2.5  MG/3ML) 0.083% nebulizer solution, Take 3 mLs (2.5 mg total) by nebulization every 4 (four) hours as needed for wheezing or shortness of breath., Disp: 75 mL, Rfl: 12   albuterol  (VENTOLIN  HFA) 108 (90 Base) MCG/ACT inhaler, Inhale 2 puffs into the lungs every 6 (six) hours as needed for wheezing or shortness of breath., Disp: 8 g, Rfl: 6   ascorbic acid (VITAMIN C) 1000 MG tablet, Take 1,000 mg by mouth daily. , Disp: , Rfl:    aspirin  EC 81 MG tablet, Take 1 tablet (81 mg total) by mouth daily. Swallow whole., Disp: 150 tablet, Rfl: 2   atorvastatin  (LIPITOR) 10 MG tablet, TAKE 1 TABLET EVERY DAY, Disp: 90 tablet, Rfl: 3   Azelastine  HCl 137 MCG/SPRAY SOLN, PLACE 2 SPRAYS INTO BOTH NOSTRILS 2 (TWO) TIMES DAILY. USE IN EACH NOSTRIL AS DIRECTED, Disp: 100 mL, Rfl: 5   Budeson-Glycopyrrol-Formoterol  (BREZTRI  AEROSPHERE) 160-9-4.8 MCG/ACT AERO, Inhale 2 puffs into the lungs 2 (two) times daily., Disp: , Rfl:    Cholecalciferol  (VITAMIN D ) 125 MCG (5000 UT) CAPS, Take 5,000 Units by mouth daily., Disp: , Rfl:    clopidogrel  (PLAVIX ) 75 MG tablet, TAKE 1 TABLET EVERY DAY, Disp: 90 tablet, Rfl: 3   docusate sodium  (COLACE) 100 MG capsule, Take 100 mg by mouth 2 (two) times daily., Disp: , Rfl:    guaiFENesin (MUCINEX) 600 MG 12 hr tablet, Take 600 mg by mouth 2 (two) times daily., Disp: , Rfl:    losartan  (COZAAR ) 25 MG tablet, 25 mg daily. , Disp: , Rfl:    Multiple Vitamins-Minerals (PRESERVISION AREDS) TABS, Take 1 tablet by mouth 2 (two)  times daily., Disp: , Rfl:    OXYGEN , Inhale 4 L into the lungs daily in the afternoon., Disp: , Rfl:    verapamil (VERELAN PM) 360 MG 24 hr capsule, Take 360 mg by mouth daily., Disp: , Rfl:    Vitamin E 180 MG CAPS, Take 180 mg by mouth daily., Disp: , Rfl:      ALLERGIES   Patient has no known allergies.  BP (!) 160/80 (BP Location: Left Arm, Patient Position: Sitting, Cuff Size: Small)   Pulse 74   Temp 97.7 F (36.5 C) (Temporal)   Ht 5\' 1"  (1.549 m)    Wt 88 lb (39.9 kg)   SpO2 94% Comment: 4L oxygen   BMI 16.63 kg/m     Review of Systems: Gen:  Denies  fever, sweats, chills weight loss  HEENT: Denies blurred vision, double vision, ear pain, eye pain, hearing loss, nose bleeds, sore throat Cardiac:  No dizziness, chest pain or heaviness, chest tightness,edema, No JVD Resp:   + Chronic productive cough, +sputum production, +shortness of breath,-wheezing, -hemoptysis,  Other:  All other systems negative   Physical Examination:   General Appearance: No distress  EYES PERRLA, EOM intact.   NECK Supple, No JVD Pulmonary: normal breath sounds, No wheezing.  CardiovascularNormal S1,S2.  No m/r/g.   Abdomen: Benign, Soft, non-tender. Neurology UE/LE 5/5 strength, no focal deficits Ext pulses intact, cap refill intact ALL OTHER ROS ARE NEGATIVE           ASSESSMENT/PLAN   89 year old pleasant white female seen today for follow-up assessment for COPD Gold stage D end-stage severe COPD with chronic hypoxic respiratory failure CT findings suggestion of severe emphysema and bronchiectasis with right lower lobe pleural-based nodule with excessive daytime sleepiness findings may sugge get are you st underlying sleep apnea but does not want to get any further sleep apnea testing nor does she want any further imaging of her lung nodule with CT scans Patient with chronic mucus production with chronic bronchiectasis chronic bronchitis   Severe COPD No exacerbation at this time Patient had trial runs of nebulized therapy but wants to switch back to Symbicort  Symbicort  was not covered by insurance so patient will try Trelegy 200 Recommend using flutter valve and incentive spirometry Using nebulizer as needed  Chronic bronchitis chronic bronchiectasis Continue flutter valve At this time but flutter valve device is insufficient patient may benefit from chest physiotherapy DME referral placed   Severe respiratory  insufficiency Continue inhalers as prescribed Continue to use oxygen  as prescribed    Patient is at a very high risk for pulmonary complications if she were to undergo any type of procedure With recent vascular surgery procedure for PAD Similar vascular site is bleeding recommend follow-up Vasc surgery office Message sent to Dr. Vonna Guardian   CT chest ABNORMAL Right pleural-based nodule based on CT scan findings Patient does not want repeat CT chest to assess interval changes She does not want to pursue any further diagnostic testing She is happy with her life and does not want any type of work-up for this nodule We addressed repeating CT scans at this time however patient does not want any  further intervention or work-up  Chronic Hypoxic resp failure due to COPD -Patient benefits from oxygen  therapy 4L   -recommend using oxygen  as prescribed -patient needs this for survival     MEDICATION ADJUSTMENTS/LABS AND TESTS ORDERED: Will try new nebulizer therapy to help with breathing Continue Breztri  as prescribed Continue nebulized therapy as prescribed Continue  flutter valve as prescribed We will try chest vest physiotherapy for chronic mucus production Avoid Allergens and Irritants Avoid secondhand smoke Avoid SICK contacts Recommend  Masking  when appropriate Recommend Keep up-to-date with vaccinations Continue oxygen  as needed Continue inhalers as prescribed  CURRENT MEDICATIONS REVIEWED AT LENGTH WITH PATIENT TODAY   Patient  satisfied with Plan of action and management. All questions answered   Follow up 8 weeks   I spent a total of 47 minutes reviewing chart data, face-to-face evaluation with the patient, counseling and coordination of care as detailed above.      Lady Pier, M.D.  [facility]  Medical Director [facility] Medical Director [facility] Cardio-Pulmonary Department

## 2023-12-02 NOTE — Telephone Encounter (Signed)
 Patient was seen today in office by Dr. Auston Left. Patient was started on Ohtuvayre . Forms signed and will be faxed to pharmacy team.

## 2023-12-03 ENCOUNTER — Telehealth: Payer: Self-pay

## 2023-12-03 NOTE — Telephone Encounter (Signed)
 Copied from CRM [id number]. Topic: General - Other >> Dec 03, 2023  2:41 PM Hilton Lucky wrote: Reason for CRM: Zack with [employer] is calling to update on an order. Manufacterer of O2VER requires first rx fill to go through [place] [facility] (Phone:[phone] or [email or web address]) where further fills will go through pharmacy of choice. States information was e-scribed to them on 12/02/2023 and just needs to be changed.

## 2023-12-04 ENCOUNTER — Telehealth: Payer: Self-pay | Admitting: Internal Medicine

## 2023-12-04 DIAGNOSIS — J479 Bronchiectasis, uncomplicated: Secondary | ICD-10-CM

## 2023-12-04 DIAGNOSIS — J439 Emphysema, unspecified: Secondary | ICD-10-CM

## 2023-12-04 DIAGNOSIS — R0689 Other abnormalities of breathing: Secondary | ICD-10-CM

## 2023-12-04 DIAGNOSIS — J9611 Chronic respiratory failure with hypoxia: Secondary | ICD-10-CM

## 2023-12-04 NOTE — Telephone Encounter (Signed)
 I have placed the order

## 2023-12-04 NOTE — Addendum Note (Signed)
 Addended by: Pepper Boyer on: 12/04/2023 03:03 PM   Modules accepted: Orders

## 2023-12-04 NOTE — Telephone Encounter (Signed)
 Per Dr. Landa Pine last office note: Patient has excessive mucus production despite flutter valve She does flutter valve 10 x 10 times a day Patient has chronic productive cough I have discussed with her possible chest physiotherapy Patient would like to try if insurance pays for CT chest confirms bilateral bronchiectasi

## 2023-12-04 NOTE — Telephone Encounter (Signed)
 Dr. Auston Left saw the patient on 12/02/23 and mentioned We will try chest vest physiotherapy for chronic mucus production. Katie with [employer] called because Dr. Kasa gave her the patients name. A [employer] order was never placed

## 2023-12-06 NOTE — Telephone Encounter (Signed)
 Community message sent to [employer] in regards to wheelchair order in Epic.

## 2023-12-10 ENCOUNTER — Telehealth: Payer: Self-pay | Admitting: Internal Medicine

## 2023-12-10 ENCOUNTER — Telehealth: Payer: Self-pay

## 2023-12-10 NOTE — Telephone Encounter (Signed)
 I have received a message from Misty Ferguson with [employer] She wants to know if you would be willing to do an addendum stating that the provider has reviewed a CT scan (and the scan date), and the doctor finds Bronchiectasis, and the patient has a 'daily or chronic productive cough greater than six months'?

## 2023-12-10 NOTE — Telephone Encounter (Signed)
 Will fax Ohtuvayre  forms to [employer]  Geraldene Kleine, PharmD, MPH, BCPS, CPP Clinical Pharmacist (Rheumatology and Pulmonology)

## 2023-12-10 NOTE — Telephone Encounter (Signed)
 Received Oht[employer]re new start paperwork. Completed form and faxed with clinicals and insurance card copy to [facility]   Phone#: [phone] Fax#: [phone]

## 2023-12-11 NOTE — Telephone Encounter (Signed)
 Received fax from [employer] with summary of benefits. Referral form for Ohtuvayre  received. Rx will be triaged to [facility].. Once benefits investigation completed, pharmacy will reach out the patient to schedule shipment. If medication is unaffordable, patient will need to express financial hardship to be referred back to [place] [facility] for patient assistance program pre-screening.   Patient ID: [id number] Pharmacy phone: [phone] [place] [facility] Phone#: [phone]

## 2023-12-18 DIAGNOSIS — J479 Bronchiectasis, uncomplicated: Secondary | ICD-10-CM | POA: Diagnosis not present

## 2023-12-19 NOTE — Telephone Encounter (Signed)
 I have sent Misty Ferguson with [employer] a message letting her know Dr. Auston Left did the addendum to his note. She had faxed a form for Dr. Auston Left to sign not sure if it was received

## 2023-12-31 ENCOUNTER — Encounter (INDEPENDENT_AMBULATORY_CARE_PROVIDER_SITE_OTHER): Payer: Self-pay

## 2024-01-09 ENCOUNTER — Encounter: Payer: Self-pay | Admitting: Internal Medicine

## 2024-01-09 ENCOUNTER — Ambulatory Visit: Admitting: Internal Medicine

## 2024-01-09 VITALS — BP 112/70 | HR 68 | Temp 97.7°F | Ht 61.0 in | Wt 90.0 lb

## 2024-01-09 DIAGNOSIS — J411 Mucopurulent chronic bronchitis: Secondary | ICD-10-CM | POA: Diagnosis not present

## 2024-01-09 DIAGNOSIS — J479 Bronchiectasis, uncomplicated: Secondary | ICD-10-CM

## 2024-01-09 DIAGNOSIS — J9611 Chronic respiratory failure with hypoxia: Secondary | ICD-10-CM | POA: Diagnosis not present

## 2024-01-09 DIAGNOSIS — Z87891 Personal history of nicotine dependence: Secondary | ICD-10-CM

## 2024-01-09 MED ORDER — BREZTRI AEROSPHERE 160-9-4.8 MCG/ACT IN AERO
2.0000 | INHALATION_SPRAY | Freq: Two times a day (BID) | RESPIRATORY_TRACT | Status: DC
Start: 2024-01-09 — End: 2024-02-10

## 2024-01-09 NOTE — Patient Instructions (Signed)
 Continue vest therapy as prescribed-GREAT JOB!! 20 minutes in the morning at 7AM  and 20 minutes at 330PM-continue current prescription Lets plan to use Mucinex as needed Lets plan to hold Breztri  and assess breathing  Recommend using oxygen  as prescribed to keep levels between 88 and 95%  Avoid Allergens and Irritants Avoid secondhand smoke Avoid SICK contacts Recommend  Masking  when appropriate Recommend Keep up-to-date with vaccinations

## 2024-01-09 NOTE — Progress Notes (Signed)
 [facility] Consultation     Date: 01/09/2024,   MRN# [id number] ROUX BRANDY 1933/11/19      Synopsis: Umberto Ganong was first seen by the [facility] pulmonary clinic in the summer of 2013. She has COPD. Simple spirometry performed in clinic showed an FEV1 of 45% predicted with clear obstruction.  She smoked 2 pack/day x 40 years, quit  17 years ago according to patient. She is a frequent exacerbation phenotype. She uses 2 L of oxygen  continuously with exertion and at night   lling, mild erythema CC  Follow-up assessment for COPD Follow-up assessment for severe respiratory insufficiency Follow-up assessment for oxygen  therapy  HPI No exacerbation at this time No evidence of heart failure at this time No evidence or signs of infection at this time No respiratory distress No fevers, chills, nausea, vomiting, diarrhea No evidence of lower extremity edema No evidence hemoptysis  Diagnosed with COVID July 2022 Declining ever since then S/p VASC procedure  Chronic Hypoxic resp failure due to COPD -Patient benefits from oxygen  therapy 2L [place]  -recommend using oxygen  as prescribed -patient needs this for survival Severe respiratory insufficiency Debilitating shortness of breath    Patient diagnosed with bladder cancer sees urology  Chronic shortness of breath and dyspnea exertion Stable over the last several years We started PDE 4 inhibitor inhaler therapy last office visit However this was very expensive and did not start nebulized therapy  Patient has excessive mucus production despite flutter valve She does flutter valve 10 x 10 times a day, despite flutter valve patient has extensive mucus production Patient has chronic productive cough more than 6 months I have discussed with her possible chest physiotherapy CT chest confirms bilateral bronchiectasis October 14, 2020 Patient started Smart vest therapy 17 days ago This has significantly  improved her symptoms and she is very happy She is less short of breath Significant mucus production has decreased with usage No evidence of infection at this time Patient does 20 minutes twice daily Intensity at 20,25 and 35% respectively Patient tolerating very well Patient is using her Mucinex as needed Plan to wean off Breztri  if she can tolerate Oxygen  saturations goal therapy is 80 to 95% Patient was in the room talking to me without oxygen  and her O2 sat was 92% Patient will need oxygen  therapy with exertion and at night    o signs of CHF or lower ext swelling at t ssues, has chCurrent Medication:   Current Outpatient Medications:    albuterol  (PROVENTIL ) (2.5 MG/3ML) 0.083% nebulizer solution, Take 3 mLs (2.5 mg total) by nebulization every 4 (four) hours as needed for wheezing or shortness of breath., Disp: 500 mL, Rfl: 12   albuterol  (VENTOLIN  HFA) 108 (90 Base) MCG/ACT inhaler, Inhale 2 puffs into the lungs every 6 (six) hours as needed for wheezing or shortness of breath., Disp: 8 g, Rfl: 6   ascorbic acid (VITAMIN C) 1000 MG tablet, Take 1,000 mg by mouth daily. , Disp: , Rfl:    aspirin  EC 81 MG tablet, Take 1 tablet (81 mg total) by mouth daily. Swallow whole., Disp: 150 tablet, Rfl: 2   atorvastatin  (LIPITOR) 10 MG tablet, TAKE 1 TABLET EVERY DAY, Disp: 90 tablet, Rfl: 3   Azelastine  HCl 137 MCG/SPRAY SOLN, PLACE 2 SPRAYS INTO BOTH NOSTRILS 2 (TWO) TIMES DAILY. USE IN EACH NOSTRIL AS DIRECTED, Disp: 100 mL, Rfl: 5   budeson-glycopyrrolate-formoterol  (BREZTRI  AEROSPHERE) 160-9-4.8 MCG/ACT AERO inhaler, Inhale 2 puffs into the lungs 2 (two)  times daily., Disp: 1 each, Rfl: 12   Cholecalciferol  (VITAMIN D ) 125 MCG (5000 UT) CAPS, Take 5,000 Units by mouth daily., Disp: , Rfl:    clopidogrel  (PLAVIX ) 75 MG tablet, TAKE 1 TABLET EVERY DAY, Disp: 90 tablet, Rfl: 3   docusate sodium  (COLACE) 100 MG capsule, Take 100 mg by mouth 2 (two) times daily., Disp: , Rfl:    Ensifentrine   (OHTUVAYRE ) 3 MG/2.5ML SUSP, Inhale 1 Act into the lungs 2 (two) times daily., Disp: 1500 mL, Rfl: 1   guaiFENesin (MUCINEX) 600 MG 12 hr tablet, Take 600 mg by mouth 2 (two) times daily., Disp: , Rfl:    losartan  (COZAAR ) 25 MG tablet, 25 mg daily. , Disp: , Rfl:    Multiple Vitamins-Minerals (PRESERVISION AREDS) TABS, Take 1 tablet by mouth 2 (two) times daily., Disp: , Rfl:    OXYGEN , Inhale 4 L into the lungs daily in the afternoon., Disp: , Rfl:    verapamil (VERELAN PM) 360 MG 24 hr capsule, Take 360 mg by mouth daily., Disp: , Rfl:    Vitamin E 180 MG CAPS, Take 180 mg by mouth daily., Disp: , Rfl:      ALLERGIES   Patient has no known allergies.  BP 112/70 (BP Location: Left Arm, Patient Position: Sitting, Cuff Size: Normal)   Pulse 68   Temp 97.7 F (36.5 C) (Oral)   Ht 5\' 1"  (1.549 m)   Wt 90 lb (40.8 kg)   SpO2 94% Comment: on room air and an hour after last using O2  BMI 17.01 kg/m     Review of Systems: Gen:  Denies  fever, sweats, chills weight loss  HEENT: Denies blurred vision, double vision, ear pain, eye pain, hearing loss, nose bleeds, sore throat Cardiac:  No dizziness, chest pain or heaviness, chest tightness,edema, No JVD Resp:   No cough, -sputum production, +shortness of breath,-wheezing, -hemoptysis,  Other:  All other systems negative   Physical Examination:   General Appearance: No distress  EYES PERRLA, EOM intact.   NECK Supple, No JVD Pulmonary: normal breath sounds, No wheezing.  CardiovascularNormal S1,S2.  No m/r/g.   Abdomen: Benign, Soft, non-tender. Neurology UE/LE 5/5 strength, no focal deficits Ext pulses intact, cap refill intact ALL OTHER ROS ARE NEGATIVE         ASSESSMENT/PLAN   89 year old pleasant white female seen today for follow-up assessment for COPD Gold stage D end-stage severe COPD with chronic hypoxic respiratory failure CT findings suggestion of severe emphysema and bronchiectasis with right lower lobe  pleural-based nodule with excessive daytime sleepiness findings may sugge get are you st underlying sleep apnea but does not want to get any further sleep apnea testing nor does she want any further imaging of her lung nodule with CT scans Patient with chronic mucus production with chronic bronchiectasis chronic bronchitis   Severe COPD No exacerbation at this time Patient currently on Breztri  inhaler therapy Plan to wean off Breztri  to see if respiratory status changes Patient feels significantly better since starting Smart vest therapy   Chronic bronchitis chronic bronchiectasis Continue flutter valve At this time but flutter valve device is insufficient  patient is clearly benefiting from chest physiotherapy Patient states her shortness of breath has improved Mucus production has diminished Patient using oxygen  only with exertion    Severe respiratory insufficiency Continue inhalers as prescribed Continue to use oxygen  as prescribed Recommend oxygen  saturations 88 to 95% Wean down to 2 L nasal cannula   PRE-OP ASSESSMENT Patient is  at a very high risk for pulmonary complications if she were to undergo any type of procedure   Chronic Hypoxic resp failure due to COPD -Patient benefits from oxygen  therapy 2L [place] with exertion and at night -recommend using oxygen  as prescribed -patient needs this for survival    CT chest ABNORMAL Right pleural-based nodule based on CT scan findings Patient does not want repeat CT chest to assess interval changes She does not want to pursue any further diagnostic testing She is happy with her life and does not want any type of work-up for this nodule We addressed repeating CT scans at this time however patient does not want any  further intervention or work-up    MEDICATION ADJUSTMENTS/LABS AND TESTS ORDERED: Continue smart vest therapy as prescribed Mucinex as needed Nebulizers and flutter valve as prescribed Wean off Breztri  and  assess breathing Continue oxygen  80 to 95% 2 L titrate as needed Avoid Allergens and Irritants Avoid secondhand smoke Avoid SICK contacts Recommend  Masking  when appropriate Recommend Keep up-to-date with vaccinations  CURRENT MEDICATIONS REVIEWED AT LENGTH WITH PATIENT TODAY   Patient  satisfied with Plan of action and management. All questions answered   Follow up 4 weeks   I spent a total of 43 minutes reviewing chart data, face-to-face evaluation with the patient, counseling and coordination of care as detailed above.      Lady Pier, M.D.  [facility]  Medical Director [facility] Medical Director [facility] Cardio-Pulmonary Department

## 2024-01-10 ENCOUNTER — Encounter

## 2024-01-17 ENCOUNTER — Other Ambulatory Visit: Payer: Self-pay | Admitting: Internal Medicine

## 2024-01-17 DIAGNOSIS — R0689 Other abnormalities of breathing: Secondary | ICD-10-CM

## 2024-01-17 DIAGNOSIS — J449 Chronic obstructive pulmonary disease, unspecified: Secondary | ICD-10-CM

## 2024-01-18 DIAGNOSIS — J479 Bronchiectasis, uncomplicated: Secondary | ICD-10-CM | POA: Diagnosis not present

## 2024-01-20 ENCOUNTER — Other Ambulatory Visit: Payer: Self-pay

## 2024-01-20 MED ORDER — AZELASTINE HCL 137 MCG/SPRAY NA SOLN: 2.0000 | Freq: Two times a day (BID) | NASAL | 5 refills | Status: AC

## 2024-01-23 ENCOUNTER — Ambulatory Visit

## 2024-01-23 VITALS — Ht 61.0 in | Wt 90.0 lb

## 2024-01-23 DIAGNOSIS — Z0000 Encounter for general adult medical examination without abnormal findings: Secondary | ICD-10-CM | POA: Diagnosis not present

## 2024-01-23 NOTE — Progress Notes (Signed)
 Subjective:   Misty Ferguson is a 89 y.o. who presents for a Medicare Wellness preventive visit.  As a reminder, Annual Wellness Visits don't include a physical exam, and some assessments may be limited, especially if this visit is performed virtually. We may recommend an in-person follow-up visit with your provider if needed.  Visit Complete: Virtual I connected with  Misty Ferguson on 01/23/24 by a audio enabled telemedicine application and verified that I am speaking with the correct person using two identifiers.  Patient Location: Home  Provider Location: Office/Clinic  I discussed the limitations of evaluation and management by telemedicine. The patient expressed understanding and agreed to proceed.  Vital Signs: Because this visit was a virtual/telehealth visit, some criteria may be missing or patient reported. Any vitals not documented were not able to be obtained and vitals that have been documented are patient reported.  VideoDeclined- This patient declined Librarian, academic. Therefore the visit was completed with audio only.  Persons Participating in Visit: Patient.  AWV Questionnaire: No: Patient Medicare AWV questionnaire was not completed prior to this visit.  Cardiac Risk Factors include: advanced age (>55men, >35 women);sedentary lifestyle;hypertension     Objective:    Today's Vitals   01/23/24 0940 01/23/24 0941  Weight: 90 lb (40.8 kg)   Height: 5' 1 (1.549 m)   PainSc:  3    Body mass index is 17.01 kg/m.     01/23/2024   10:03 AM 06/03/2023    8:14 AM 02/01/2023    9:31 PM 11/12/2022    8:33 AM 09/03/2022    9:56 AM 11/20/2021    9:16 AM 11/07/2021    9:55 AM  Advanced Directives  Does Patient Have a Medical Advance Directive? Yes Yes Yes Yes No No Yes  Type of Estate agent of [place];Living will Healthcare Power of [place];Living will Healthcare Power of [place];Living will Healthcare Power of  [place];Living will   Healthcare Power of [place];Living will  Copy of Healthcare Power of Attorney in Chart? Yes - validated most recent copy scanned in chart (See row information) No - copy requested  No - copy requested   No - copy requested    Current Medications (verified) Outpatient Encounter Medications as of 01/23/2024  Medication Sig   Acetylcysteine (NAC 600 PO) Take by mouth.   albuterol  (PROVENTIL ) (2.5 MG/3ML) 0.083% nebulizer solution INHALE THE CONTENTS OF 1 VIAL VIA NEBULIZER EVERY 4 HOURS AS NEEDED FOR WHEEZING OR SHORTNESS OF BREATH.   albuterol  (VENTOLIN  HFA) 108 (90 Base) MCG/ACT inhaler Inhale 2 puffs into the lungs every 6 (six) hours as needed for wheezing or shortness of breath.   ascorbic acid (VITAMIN C) 1000 MG tablet Take 1,000 mg by mouth daily.    aspirin  EC 81 MG tablet Take 1 tablet (81 mg total) by mouth daily. Swallow whole.   atorvastatin  (LIPITOR) 10 MG tablet TAKE 1 TABLET EVERY DAY   Azelastine  HCl 137 MCG/SPRAY SOLN Place 2 sprays into both nostrils 2 (two) times daily.   budeson-glycopyrrolate-formoterol  (BREZTRI  AEROSPHERE) 160-9-4.8 MCG/ACT AERO inhaler Inhale 2 puffs into the lungs 2 (two) times daily.   budesonide -glycopyrrolate-formoterol  (BREZTRI  AEROSPHERE) 160-9-4.8 MCG/ACT AERO inhaler Inhale 2 puffs into the lungs in the morning and at bedtime.   Cholecalciferol  (VITAMIN D ) 125 MCG (5000 UT) CAPS Take 5,000 Units by mouth daily.   clopidogrel  (PLAVIX ) 75 MG tablet TAKE 1 TABLET EVERY DAY   Coenzyme Q10 (COQ10) 200 MG CAPS Take by mouth.  docusate sodium  (COLACE) 100 MG capsule Take 100 mg by mouth 2 (two) times daily.   Ensifentrine  (OHTUVAYRE ) 3 MG/2.5ML SUSP Inhale 1 Act into the lungs 2 (two) times daily.   guaiFENesin (MUCINEX) 600 MG 12 hr tablet Take 600 mg by mouth 2 (two) times daily.   losartan  (COZAAR ) 25 MG tablet 25 mg daily.    Multiple Vitamins-Minerals (PRESERVISION AREDS) TABS Take 1 tablet by mouth 2 (two) times daily.    OXYGEN  Inhale 4 L into the lungs daily in the afternoon.   verapamil (VERELAN PM) 360 MG 24 hr capsule Take 360 mg by mouth daily.   Vitamin E 180 MG CAPS Take 180 mg by mouth daily.   No facility-administered encounter medications on file as of 01/23/2024.    Allergies (verified) Patient has no known allergies.   History: Past Medical History:  Diagnosis Date   AK (actinic keratosis) 12/08/2020   right pretibia inferior bx proven, LN2 01/10/21   Burping    Chronic airway obstruction, not elsewhere classified    Dyspnea    Dysrhythmia    Macular degeneration (senile) of retina, unspecified    Malignant neoplasm of urinary bladder (HCC) 03/2019   Partial bladder resection and Rad tx's.    Obstructive chronic bronchitis with exacerbation (HCC)    Osteoporosis, unspecified    Oxygen  deficiency    Personal history of peptic ulcer disease    SCC (squamous cell carcinoma) 12/08/2020   right pretibia superior, EDC 01/10/21   SCC (squamous cell carcinoma) 07/03/2022   left medial calf, clear 09/25/22   SCC (squamous cell carcinoma) 11/22/2022   right thigh   treated with ED&C   Squamous cell carcinoma in situ (SCCIS) 07/03/2022   right pretibia sccis needs treatment with fluorouracil    Tobacco use disorder    Unspecified essential hypertension    Unspecified glaucoma(365.9)    Past Surgical History:  Procedure Laterality Date   CATARACT EXTRACTION W/ INTRAOCULAR LENS  IMPLANT, BILATERAL     CYSTOSCOPY WITH STENT PLACEMENT Left 05/22/2019   Procedure: CYSTOSCOPY WITH STENT PLACEMENT;  Surgeon: Lawerence Pressman, MD;  Location: [facility];  Service: Urology;  Laterality: Left;   ELBOW FRACTURE SURGERY Left    LOWER EXTREMITY ANGIOGRAPHY Right 11/20/2021   Procedure: Lower Extremity Angiography;  Surgeon: Celso College, MD;  Location: [facility];  Service: Cardiovascular;  Laterality: Right;   LOWER EXTREMITY ANGIOGRAPHY Left 09/03/2022   Procedure: Lower Extremity Angiography;   Surgeon: Celso College, MD;  Location: [facility];  Service: Cardiovascular;  Laterality: Left;   LOWER EXTREMITY ANGIOGRAPHY Left 06/03/2023   Procedure: Lower Extremity Angiography;  Surgeon: Celso College, MD;  Location: [facility];  Service: Cardiovascular;  Laterality: Left;   TRANSURETHRAL RESECTION OF BLADDER TUMOR N/A 05/22/2019   Procedure: TRANSURETHRAL RESECTION OF BLADDER TUMOR (TURBT);  Surgeon: Lawerence Pressman, MD;  Location: [facility];  Service: Urology;  Laterality: N/A;   Family History  Adopted: Yes   Social History   Socioeconomic History   Marital status: Married    Spouse name: Lyell Samuel   Number of children: 3   Years of education: Not on file   Highest education level: Not on file  Occupational History   Occupation: RETIRED    Employer: RETIRED  Tobacco Use   Smoking status: Former    Current packs/day: 0.00    Average packs/day: 1.3 packs/day for 45.0 years (58.5 ttl pk-yrs)    Types: Cigarettes  Start date: 29    Quit date: 1998    Years since quitting: 27.4    Passive exposure: Past   Smokeless tobacco: Never  Vaping Use   Vaping status: Never Used  Substance and Sexual Activity   Alcohol use: Yes    Alcohol/week: 14.0 standard drinks of alcohol    Types: 14 Glasses of wine per week    Comment: 1x/day   Drug use: No   Sexual activity: Yes    Birth control/protection: Post-menopausal  Other Topics Concern   Not on file  Social History Narrative   Tobacco abuse x 45 years   Lives with husband in a home    Social Drivers of Health   Financial Resource Strain: Low Risk  (01/23/2024)   Overall Financial Resource Strain (CARDIA)    Difficulty of Paying Living Expenses: Not hard at all  Food Insecurity: No Food Insecurity (01/23/2024)   Hunger Vital Sign    Worried About Running Out of Food in the Last Year: Never true    Ran Out of Food in the Last Year: Never true  Transportation Needs: No Transportation Needs (01/23/2024)    PRAPARE - Administrator, Civil Service (Medical): No    Lack of Transportation (Non-Medical): No  Physical Activity: Inactive (01/23/2024)   Exercise Vital Sign    Days of Exercise per Week: 0 days    Minutes of Exercise per Session: 0 min  Stress: No Stress Concern Present (01/23/2024)   [employer] of Occupational Health - Occupational Stress Questionnaire    Feeling of Stress: Not at all  Social Connections: Moderately Isolated (01/23/2024)   Social Connection and Isolation Panel    Frequency of Communication with Friends and Family: Three times a week    Frequency of Social Gatherings with Friends and Family: Never    Attends Religious Services: Never    Database administrator or Organizations: No    Attends Engineer, structural: Never    Marital Status: Married    Tobacco Counseling Counseling given: Not Answered    Clinical Intake:  Pre-visit preparation completed: Yes  Pain : 0-10 Pain Score: 3  Pain Type: Neuropathic pain Pain Location: Foot Pain Orientation: Right, Left Pain Descriptors / Indicators: Aching, Burning, Shooting, Tingling Pain Onset: More than a month ago Pain Frequency: Constant Pain Relieving Factors: natural remedies Effect of Pain on Daily Activities: limits what she can do greatly  Pain Relieving Factors: natural remedies  BMI - recorded: 17.01 Nutritional Status: BMI <19  Underweight Nutritional Risks: None Diabetes: No  Lab Results  Component Value Date   HGBA1C 5.6 11/15/2023   HGBA1C 5.7 11/06/2022   HGBA1C 5.6 07/25/2022     How often do you need to have someone help you when you read instructions, pamphlets, or other written materials from your doctor or pharmacy?: 1 - Never  Interpreter Needed?: No  Comments: lives with husband Information entered by :: B.Malcomb Gangemi,LPN   Activities of Daily Living     01/23/2024   10:03 AM 06/03/2023    8:09 AM  In your present state of health, do you have any  difficulty performing the following activities:  Hearing? 1 0  Vision? 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 0 0  Walking or climbing stairs? 1   Dressing or bathing? 1   Doing errands, shopping? 1   Preparing Food and eating ? Fernande Howells  Using the Toilet? Y   In the past six months, have you accidently leaked urine? N   Do you have problems with loss of bowel control? N   Managing your Medications? Y   Managing your Finances? Y   Housekeeping or managing your Housekeeping? Y     Patient Care Team: Judithann Novas, MD as PCP - General Cleve Dale, MD as Consulting Physician (Pulmonary Disease) Clair Crews, MD as Referring Physician (Ophthalmology) Alta Ast, [facility] (Inactive) as Pharmacist (Pharmacist) Link Rice, MD as Referring Physician (Cardiology) Lawerence Pressman, MD as Consulting Physician (Urology)  I have updated your Care Teams any recent Medical Services you may have received from other providers in the past year.     Assessment:   This is a routine wellness examination for Misty Ferguson.  Hearing/Vision screen Hearing Screening - Comments:: Pt says her hearing is not as good;declines hearing test Vision Screening - Comments:: Pt says she cannot see due to MCD  Dr Merrell Abate   Goals Addressed             This Visit's Progress    Patient Stated       No new goals just doing the best I can and take it day by day       Depression Screen     01/23/2024    9:57 AM 11/22/2023   10:23 AM 11/12/2022    8:32 AM 11/07/2021    9:48 AM 11/01/2020    9:59 AM 10/28/2019    9:53 AM 11/05/2017    3:42 PM  PHQ 2/9 Scores  PHQ - 2 Score 0 0 0 0 0 0 1  PHQ- 9 Score     0 0     Fall Risk     01/23/2024    9:49 AM 11/22/2023   10:23 AM 11/12/2022    8:34 AM 11/07/2021    9:46 AM 11/01/2020    9:57 AM  Fall Risk   Falls in the past year? 1 1 0 0 0  Number falls in past yr: 0 0 0 0 0  Injury with Fall? 0 0 0  0 0  Risk for fall due to : Impaired balance/gait;Impaired vision;Impaired mobility Impaired mobility No Fall Risks Orthopedic patient;Other (Comment) Medication side effect  Risk for fall due to: Comment    neuropathy   Follow up Education provided;Falls prevention discussed Falls evaluation completed Falls prevention discussed;Falls evaluation completed Education provided;Falls prevention discussed  Falls evaluation completed;Falls prevention discussed      Data saved with a previous flowsheet row definition    MEDICARE RISK AT HOME:  Medicare Risk at Home Any stairs in or around the home?: No If so, are there any without handrails?: Yes Home free of loose throw rugs in walkways, pet beds, electrical cords, etc?: Yes Adequate lighting in your home to reduce risk of falls?: Yes Life alert?: No Use of a cane, walker or w/c?: Yes Grab bars in the bathroom?: No Shower chair or bench in shower?: Yes Elevated toilet seat or a handicapped toilet?: Yes  TIMED UP AND GO:  Was the test performed?  No  Cognitive Function: 6CIT completed    11/01/2020   10:03 AM 10/28/2019    9:56 AM 10/11/2017    1:59 PM 10/04/2016    1:44 PM  MMSE - Mini Mental State Exam  Orientation to time 5 5 5 5    Orientation to Place 5 5 5 5    Registration 3 3  3 3   Attention/ Calculation 5 5 0 0   Recall 3 3 3 3    Language- name 2 objects   0 0   Language- repeat 1 1 1 1   Language- follow 3 step command   3 3   Language- read & follow direction   0 0   Write a sentence   0 0   Copy design   0 0   Total score   20 20      Data saved with a previous flowsheet row definition        01/23/2024   10:05 AM 11/12/2022    8:37 AM 11/07/2021    9:55 AM  6CIT Screen  What Year? 0 points 0 points 0 points  What month? 0 points 0 points 0 points  What time? 0 points 0 points 0 points  Count back from 20 0 points 0 points 0 points  Months in reverse 0 points 0 points 0 points  Repeat phrase 0 points 0 points 2  points  Total Score 0 points 0 points 2 points    Immunizations Immunization History  Administered Date(s) Administered   Fluad Quad(high Dose 65+) 04/09/2019, 04/27/2020, 04/28/2021, 05/08/2022   Fluad Trivalent(High Dose 65+) 05/01/2023   H1N1 10/04/2008   Influenza Split 04/21/2011, 05/12/2012   Influenza Whole 05/27/2007, 05/08/2008, 05/04/2009, 04/26/2010   Influenza, High Dose Seasonal PF 05/01/2023   Influenza,inj,Quad PF,6+ Mos 05/06/2013, 05/06/2014, 04/21/2015, 05/11/2016, 04/17/2017, 05/01/2018   Pneumococcal Conjugate-13 09/21/2014   Pneumococcal Polysaccharide-23 03/13/2006, 06/28/2011   Td 03/13/2006   Tdap 10/04/2016   Zoster Recombinant(Shingrix) 11/08/2020, 01/19/2021   Zoster, Live 09/05/2010    Screening Tests Health Maintenance  Topic Date Due   DEXA SCAN  11/21/2024 (Originally 05/28/2021)   INFLUENZA VACCINE  03/13/2024   Medicare Annual Wellness (AWV)  01/22/2025   DTaP/Tdap/Td (3 - Td or Tdap) 10/04/2026   Pneumococcal Vaccine: 50+ Years  Completed   Zoster Vaccines- Shingrix  Completed   HPV VACCINES  Aged Out   Meningococcal B Vaccine  Aged Out   COVID-19 Vaccine  Discontinued    Health Maintenance  There are no preventive care reminders to display for this patient. Health Maintenance Items Addressed: None needed at this time  Additional Screening:  Vision Screening: Recommended annual ophthalmology exams for early detection of glaucoma and other disorders of the eye. Would you like a referral to an eye doctor? No    Dental Screening: Recommended annual dental exams for proper oral hygiene  Community Resource Referral / Chronic Care Management: CRR required this visit?  No   CCM required this visit?  No   Plan:    I have personally reviewed and noted the following in the patient's chart:   Medical and social history Use of alcohol, tobacco or illicit drugs  Current medications and supplements including opioid prescriptions.  Patient is not currently taking opioid prescriptions. Functional ability and status Nutritional status Physical activity Advanced directives List of other physicians Hospitalizations, surgeries, and ER visits in previous 12 months Vitals Screenings to include cognitive, depression, and falls Referrals and appointments  In addition, I have reviewed and discussed with patient certain preventive protocols, quality metrics, and best practice recommendations. A written personalized care plan for preventive services as well as general preventive health recommendations were provided to patient.   Misty Bannister, LPN   9/60/4540   After Visit Summary: (MyChart) Due to this being a telephonic visit, the after visit summary with patients personalized  plan was offered to patient via MyChart   Notes: Nothing significant to report at this time.

## 2024-01-23 NOTE — Patient Instructions (Signed)
 Misty Ferguson , Thank you for taking time out of your busy schedule to complete your Annual Wellness Visit with me. I enjoyed our conversation and look forward to speaking with you again next year. I, as well as your care team,  appreciate your ongoing commitment to your health goals. Please review the following plan we discussed and let me know if I can assist you in the future. Your Game plan/ To Do List    Follow up Visits: Next Medicare AWV with our clinical staff: 01/26/25 @ 10:50am   Have you seen your provider in the last 6 months (3 months if uncontrolled diabetes)? Yes Next Office Visit with your provider: 11/25/2024  Clinician Recommendations:  Aim for 30 minutes of exercise or brisk walking, 6-8 glasses of water, and 5 servings of fruits and vegetables each day.       This is a list of the screening recommended for you and due dates:  Health Maintenance  Topic Date Due   DEXA scan (bone density measurement)  11/21/2024*   Flu Shot  03/13/2024   Medicare Annual Wellness Visit  01/22/2025   DTaP/Tdap/Td vaccine (3 - Td or Tdap) 10/04/2026   Pneumococcal Vaccine for age over 52  Completed   Zoster (Shingles) Vaccine  Completed   HPV Vaccine  Aged Out   Meningitis B Vaccine  Aged Out   COVID-19 Vaccine  Discontinued  *Topic was postponed. The date shown is not the original due date.    Advanced directives: (In Chart) A copy of your advanced directives are scanned into your chart should your provider ever need it. Advance Care Planning is important because it:  [x]  Makes sure you receive the medical care that is consistent with your values, goals, and preferences  [x]  It provides guidance to your family and loved ones and reduces their decisional burden about whether or not they are making the right decisions based on your wishes.  Follow the link provided in your after visit summary or read over the paperwork we have mailed to you to help you started getting your Advance Directives  in place. If you need assistance in completing these, please reach out to us  so that we can help you!

## 2024-02-07 ENCOUNTER — Other Ambulatory Visit: Payer: Self-pay | Admitting: Internal Medicine

## 2024-02-07 DIAGNOSIS — J449 Chronic obstructive pulmonary disease, unspecified: Secondary | ICD-10-CM

## 2024-02-07 DIAGNOSIS — R0689 Other abnormalities of breathing: Secondary | ICD-10-CM

## 2024-02-10 ENCOUNTER — Ambulatory Visit: Admitting: Internal Medicine

## 2024-02-10 ENCOUNTER — Encounter: Payer: Self-pay | Admitting: Internal Medicine

## 2024-02-10 VITALS — BP 100/60 | HR 69 | Temp 98.0°F | Ht 61.0 in | Wt 89.0 lb

## 2024-02-10 DIAGNOSIS — J479 Bronchiectasis, uncomplicated: Secondary | ICD-10-CM

## 2024-02-10 DIAGNOSIS — J439 Emphysema, unspecified: Secondary | ICD-10-CM | POA: Diagnosis not present

## 2024-02-10 DIAGNOSIS — J9611 Chronic respiratory failure with hypoxia: Secondary | ICD-10-CM

## 2024-02-10 DIAGNOSIS — J449 Chronic obstructive pulmonary disease, unspecified: Secondary | ICD-10-CM | POA: Diagnosis not present

## 2024-02-10 DIAGNOSIS — R0689 Other abnormalities of breathing: Secondary | ICD-10-CM

## 2024-02-10 DIAGNOSIS — J411 Mucopurulent chronic bronchitis: Secondary | ICD-10-CM

## 2024-02-10 MED ORDER — BREZTRI AEROSPHERE 160-9-4.8 MCG/ACT IN AERO: 2.0000 | INHALATION_SPRAY | Freq: Two times a day (BID) | RESPIRATORY_TRACT | Status: DC

## 2024-02-10 NOTE — Progress Notes (Signed)
 [facility] [place] Pulmonary Medicine Consultation     Date: 02/10/2024,   MRN# [id number] Misty Ferguson 06/26/1934      Synopsis: Misty Ferguson was first seen by the [facility] pulmonary clinic in the summer of 2013. She has COPD. Simple spirometry performed in clinic showed an FEV1 of 45% predicted with clear obstruction.  She smoked 2 pack/day x 40 years, quit  17 years ago according to patient. She is a frequent exacerbation phenotype. She uses 2 L of oxygen  continuously with exertion and at night   lling, mild erythema CC  Follow-up assessment for COPD And for severe respiratory insufficiency Follow-up assessment for oxygen  therapy Follow-up assessment for chronic bronchitis with chronic bronchiectasis   HPI No exacerbation at this time No evidence of heart failure at this time No evidence or signs of infection at this time No respiratory distress No fevers, chills, nausea, vomiting, diarrhea No evidence of lower extremity edema No evidence hemoptysis  We had decreased Breztri  inhaler to 2 puffs daily however with increased sputum production will prescribe Breztri  inhaler 2 puffs twice daily   Diagnosed with COVID July 2022 Declining ever since then S/p VASC procedure  Chronic Hypoxic resp failure due to COPD -Patient benefits from oxygen  therapy 2L [place]  -recommend using oxygen  as prescribed -patient needs this for survival Severe respiratory insufficiency Debilitating shortness of breath Chronic shortness of breath and dyspnea exertion Stable over the last several years We started PDE 4 inhibitor inhaler therapy last office visit However this was very expensive and did not start nebulized therapy We will talk about Daliresp therapy at next office visit   Patient diagnosed with bladder cancer sees urology  Assessment of chronic bronchitis and chronic bronchiectasis Patient has excessive mucus production despite flutter valve She does flutter valve 10 x 10  times a day, despite flutter valve patient has extensive mucus production Patient has chronic productive cough more than 6 months I have discussed with her possible chest physiotherapy CT chest confirms bilateral bronchiectasis October 14, 2020 Patient started Smart vest therapy 1 months  ago Her symptoms improved significantly and she is very happy Less short of breath Significant mucus production has decreased with usage No evidence of infection Smart vest regimen includes the following Patient does 20 minutes twice daily Intensity at 20,25 and 35% respectively Patient tolerating very well Patient is using her Mucinex as needed  Oxygen  saturations goal therapy is 88 to 95% Patient was in the room talking to me without oxygen  and her O2 sat was 92% Patient will need oxygen  therapy with exertion and at night    o signs of CHF or lower ext swelling at t ssues, has chCurrent Medication:   Current Outpatient Medications:    Acetylcysteine (NAC 600 PO), Take by mouth., Disp: , Rfl:    albuterol  (PROVENTIL ) (2.5 MG/3ML) 0.083% nebulizer solution, INHALE THE CONTENTS OF 1 VIAL VIA NEBULIZER EVERY 4 HOURS AS NEEDED FOR WHEEZING OR SHORTNESS OF BREATH., Disp: 450 mL, Rfl: 10   albuterol  (VENTOLIN  HFA) 108 (90 Base) MCG/ACT inhaler, Inhale 2 puffs into the lungs every 6 (six) hours as needed for wheezing or shortness of breath., Disp: 8 g, Rfl: 6   ascorbic acid (VITAMIN C) 1000 MG tablet, Take 1,000 mg by mouth daily. , Disp: , Rfl:    aspirin  EC 81 MG tablet, Take 1 tablet (81 mg total) by mouth daily. Swallow whole., Disp: 150 tablet, Rfl: 2   atorvastatin  (LIPITOR) 10 MG tablet, TAKE 1 TABLET EVERY  DAY, Disp: 90 tablet, Rfl: 3   Azelastine  HCl 137 MCG/SPRAY SOLN, Place 2 sprays into both nostrils 2 (two) times daily., Disp: 100 mL, Rfl: 5   budeson-glycopyrrolate-formoterol  (BREZTRI  AEROSPHERE) 160-9-4.8 MCG/ACT AERO inhaler, Inhale 2 puffs into the lungs 2 (two) times daily., Disp: 1 each,  Rfl: 12   budesonide -glycopyrrolate-formoterol  (BREZTRI  AEROSPHERE) 160-9-4.8 MCG/ACT AERO inhaler, Inhale 2 puffs into the lungs in the morning and at bedtime., Disp: , Rfl:    Cholecalciferol  (VITAMIN D ) 125 MCG (5000 UT) CAPS, Take 5,000 Units by mouth daily., Disp: , Rfl:    clopidogrel  (PLAVIX ) 75 MG tablet, TAKE 1 TABLET EVERY DAY, Disp: 90 tablet, Rfl: 3   Coenzyme Q10 (COQ10) 200 MG CAPS, Take by mouth., Disp: , Rfl:    docusate sodium  (COLACE) 100 MG capsule, Take 100 mg by mouth 2 (two) times daily., Disp: , Rfl:    Ensifentrine  (OHTUVAYRE ) 3 MG/2.5ML SUSP, Inhale 1 Act into the lungs 2 (two) times daily., Disp: 1500 mL, Rfl: 1   guaiFENesin (MUCINEX) 600 MG 12 hr tablet, Take 600 mg by mouth 2 (two) times daily., Disp: , Rfl:    losartan  (COZAAR ) 25 MG tablet, 25 mg daily. , Disp: , Rfl:    Multiple Vitamins-Minerals (PRESERVISION AREDS) TABS, Take 1 tablet by mouth 2 (two) times daily., Disp: , Rfl:    OXYGEN , Inhale 4 L into the lungs daily in the afternoon., Disp: , Rfl:    verapamil (VERELAN PM) 360 MG 24 hr capsule, Take 360 mg by mouth daily., Disp: , Rfl:    Vitamin E 180 MG CAPS, Take 180 mg by mouth daily., Disp: , Rfl:   Immunization History  Administered Date(s) Administered   Fluad Quad(high Dose 65+) 04/09/2019, 04/27/2020, 04/28/2021, 05/08/2022   Fluad Trivalent(High Dose 65+) 05/01/2023   H1N1 10/04/2008   Influenza Split 04/21/2011, 05/12/2012   Influenza Whole 05/27/2007, 05/08/2008, 05/04/2009, 04/26/2010   Influenza, High Dose Seasonal PF 05/01/2023   Influenza,inj,Quad PF,6+ Mos 05/06/2013, 05/06/2014, 04/21/2015, 05/11/2016, 04/17/2017, 05/01/2018   Pneumococcal Conjugate-13 09/21/2014   Pneumococcal Polysaccharide-23 03/13/2006, 06/28/2011   Td 03/13/2006   Tdap 10/04/2016   Zoster Recombinant(Shingrix) 11/08/2020, 01/19/2021   Zoster, Live 09/05/2010     ALLERGIES   Patient has no known allergies.  BP 100/60 (BP Location: Right Arm, Patient  Position: Sitting, Cuff Size: Normal)   Pulse 69   Temp 98 F (36.7 C) (Oral)   Ht 5' 1 (1.549 m)   Wt 89 lb (40.4 kg)   SpO2 92% Comment: on 2L of o2  BMI 16.82 kg/m      Review of Systems: Gen:  Denies  fever, sweats, chills weight loss  HEENT: Denies blurred vision, double vision, ear pain, eye pain, hearing loss, nose bleeds, sore throat Cardiac:  No dizziness, chest pain or heaviness, chest tightness,edema, No JVD Resp:   + cough, +sputum production, +shortness of breath,-wheezing, -hemoptysis,  Other:  All other systems negative   Physical Examination:   General Appearance: No distress  EYES PERRLA, EOM intact.   NECK Supple, No JVD Pulmonary: normal breath sounds, No wheezing.  CardiovascularNormal S1,S2.  No m/r/g.   Abdomen: Benign, Soft, non-tender. Neurology UE/LE 5/5 strength, no focal deficits Ext pulses intact, cap refill intact ALL OTHER ROS ARE NEGATIVE      ASSESSMENT/PLAN   89 year old pleasant white female seen today for follow-up assessment for COPD Gold stage D end-stage severe COPD with chronic hypoxic respiratory failure CT findings suggestion of severe emphysema and bronchiectasis with  right lower lobe pleural-based nodule with excessive daytime sleepiness findings may sugge get are you st underlying sleep apnea but does not want to get any further sleep apnea testing nor does she want any further imaging of her lung nodule with CT scans Patient with chronic mucus production with chronic bronchiectasis chronic bronchitis   Severe COPD No exacerbation at this time Patient currently on Breztri  inhaler therapy Plan to increase Breztri  inhaler 2 puffs twice daily Rinse mouth after use We will plan to discuss Daliresp therapy at next office visit   Chronic bronchitis chronic bronchiectasis patient is clearly benefiting from chest physiotherapy Mucus production has diminished Patient using oxygen  only with exertion Patient does 20 minutes twice  daily Intensity at 20,25 and 35% respectively Patient tolerating very well   Severe respiratory insufficiency Continue to use oxygen  as prescribed Continue supportive care Avoid infections Keep up-to-date on immunizations   Chronic Hypoxic resp failure due to COPD -Patient benefits from oxygen  therapy 2L [place]  -recommend using oxygen  as prescribed -patient needs this for survival Use oxygen  with exertion and at night   CT chest ABNORMAL Right pleural-based nodule based on CT scan findings Patient does not want repeat CT chest to assess interval changes She does not want to pursue any further diagnostic testing She is happy with her life and does not want any type of work-up for this nodule We addressed repeating CT scans at this time however patient does not want any  further intervention or work-up    MEDICATION ADJUSTMENTS/LABS AND TESTS ORDERED: Continue smart vest therapy as prescribed Mucinex as needed Nebulizers and flutter valve as prescribed Reestablish Breztri  inhaler therapy 2 puffs twice daily Rinse mouth after use Continue oxygen  88 to 95% 2 L titrate as needed Avoid Allergens and Irritants Avoid secondhand smoke Avoid SICK contacts Recommend  Masking  when appropriate Recommend Keep up-to-date with vaccinations    CURRENT MEDICATIONS REVIEWED AT LENGTH WITH PATIENT TODAY   Patient  satisfied with Plan of action and management. All questions answered   Follow up 3 months   I spent a total of 48 minutes dedicated to the care of this patient on the date of this encounter to include pre-visit review of records, face-to-face time with the patient discussing conditions above, post visit ordering of testing, clinical documentation with the electronic health record, making appropriate referrals as documented, and communicating necessary information to the patient's healthcare team.    The Patient requires high complexity decision making for assessment and  support, frequent evaluation and titration of therapies, application of advanced monitoring technologies and extensive interpretation of multiple databases.  Patient satisfied with Plan of action and management. All questions answered    Nickolas Alm Cellar, M.D.  [facility]  Medical Director [facility] Medical Director [facility] Cardio-Pulmonary Department

## 2024-02-10 NOTE — Patient Instructions (Signed)
 Lets plan to use Breztri  2 puffs in the morning 2 puffs at night Please rinse mouth after use  We will plan to consider Daliresp as an oral medication at next visit  Continue inhalers as prescribed Continue oxygen  as prescribed  Continue vest therapy and flutter valve device  Avoid Allergens and Irritants Avoid secondhand smoke Avoid SICK contacts Recommend  Masking  when appropriate Recommend Keep up-to-date with vaccinations

## 2024-02-17 DIAGNOSIS — J479 Bronchiectasis, uncomplicated: Secondary | ICD-10-CM | POA: Diagnosis not present

## 2024-02-19 ENCOUNTER — Other Ambulatory Visit (INDEPENDENT_AMBULATORY_CARE_PROVIDER_SITE_OTHER): Payer: Self-pay | Admitting: Vascular Surgery

## 2024-02-24 ENCOUNTER — Other Ambulatory Visit: Payer: Self-pay | Admitting: Family Medicine

## 2024-02-24 ENCOUNTER — Telehealth: Payer: Self-pay

## 2024-02-24 DIAGNOSIS — J479 Bronchiectasis, uncomplicated: Secondary | ICD-10-CM

## 2024-02-24 DIAGNOSIS — J439 Emphysema, unspecified: Secondary | ICD-10-CM

## 2024-02-24 DIAGNOSIS — J449 Chronic obstructive pulmonary disease, unspecified: Secondary | ICD-10-CM

## 2024-02-24 DIAGNOSIS — J9611 Chronic respiratory failure with hypoxia: Secondary | ICD-10-CM

## 2024-02-24 DIAGNOSIS — J411 Mucopurulent chronic bronchitis: Secondary | ICD-10-CM

## 2024-02-24 MED ORDER — BREZTRI AEROSPHERE 160-9-4.8 MCG/ACT IN AERO: 2.0000 | INHALATION_SPRAY | Freq: Two times a day (BID) | RESPIRATORY_TRACT | 3 refills | Status: AC

## 2024-02-24 NOTE — Telephone Encounter (Signed)
 Received fax from [facility] requesting updated Rx on Breztri  Inhaler.  Rx sent electronically to [facility] as requested.

## 2024-02-24 NOTE — Telephone Encounter (Signed)
 Copied from CRM [id number]. Topic: Clinical - Prescription Issue >> Feb 24, 2024  1:36 PM Rozanna MATSU wrote: Reason for CRM: PT SPOUSE CALLING ABOUT FAX THAT SHOULD BE SENT TODAY FOR budesonide -glycopyrrolate-formoterol  (BREZTRI  AEROSPHERE) 160-9-4.8 MCG/ACT AERO inhaler. PLEASE REACH OUT TO PATIENT

## 2024-02-27 ENCOUNTER — Encounter: Payer: Self-pay | Admitting: Urology

## 2024-02-28 DIAGNOSIS — H353134 Nonexudative age-related macular degeneration, bilateral, advanced atrophic with subfoveal involvement: Secondary | ICD-10-CM | POA: Diagnosis not present

## 2024-02-28 DIAGNOSIS — Z961 Presence of intraocular lens: Secondary | ICD-10-CM | POA: Diagnosis not present

## 2024-03-05 ENCOUNTER — Encounter: Payer: Self-pay | Admitting: Podiatry

## 2024-03-05 ENCOUNTER — Ambulatory Visit: Payer: Medicare HMO | Admitting: Podiatry

## 2024-03-05 DIAGNOSIS — I739 Peripheral vascular disease, unspecified: Secondary | ICD-10-CM | POA: Diagnosis not present

## 2024-03-05 DIAGNOSIS — B351 Tinea unguium: Secondary | ICD-10-CM

## 2024-03-05 DIAGNOSIS — M79676 Pain in unspecified toe(s): Secondary | ICD-10-CM

## 2024-03-05 DIAGNOSIS — G6289 Other specified polyneuropathies: Secondary | ICD-10-CM | POA: Diagnosis not present

## 2024-03-05 NOTE — Progress Notes (Signed)
  Subjective:  Patient ID: Misty Ferguson, female    DOB: May 11, 1934,  MRN: [id number]  Misty Ferguson presents to clinic today for at risk foot care. Patient has h/o PAD and neuropathy and painful thick toenails that are difficult to trim. Pain interferes with ambulation. Aggravating factors include wearing enclosed shoe gear. Pain is relieved with periodic professional debridement. She is accompanied by her husband on today's visit. She is followed by Vascular Surgery for her PAD. Chief Complaint  Patient presents with   Nail Problem    Thick painful toenails, 9 week follow up    New problem(s): None.   PCP is Avelina Greig BRAVO, MD. Misty Ferguson 11/22/2023.  No Known Allergies  Review of Systems: Negative except as noted in the HPI.  Objective: No changes noted in today's physical examination. There were no vitals filed for this visit. Misty Ferguson is a pleasant 89 y.o. female thin build in NAD. AAO x 3. On supplemental oxygen .  Vascular Examination: CFT <3 seconds b/l. DP/PT pulses faintly palpable b/l. Skin temperature gradient warm to warm b/l. No pain with calf compression. No ischemia or gangrene. No cyanosis or clubbing noted b/l. Evidence of skin changes consistent with long term venous stasis LLE.   Neurological Examination: Protective sensation diminished with 10g monofilament b/l.  Dermatological Examination: Pedal skin warm and supple b/l.   No open wounds. No interdigital macerations.  Toenails 1-5 b/l thick, discolored, elongated with subungual debris and pain on dorsal palpation.    No corns, calluses nor porokeratotic lesions noted.  Musculoskeletal Examination: Muscle strength 5/5 to all lower extremity muscle groups bilaterally. Hammertoe(s) bilateral 5th toes. Utilizes transport chair for ambulation assistance.  Radiographs: None  Last A1c:      Latest Ref Rng & Units 11/15/2023    7:51 AM  Hemoglobin A1C  Hemoglobin-A1c 4.6 - 6.5 % 5.6    Assessment/Plan: 1.  Pain due to onychomycosis of toenail   2. Other polyneuropathy   3. PVD (peripheral vascular disease) (HCC)   -Patient's family member present. All questions/concerns addressed on today's visit. -Patient to continue soft, supportive shoe gear daily. -Toenails 1-5 b/l were debrided in length and girth with sterile nail nippers and dremel without iatrogenic bleeding.  -Patient/POA to call should there be question/concern in the interim.   Return in about 6 months (around 09/05/2024).  Delon LITTIE Merlin, DPM      [place] LOCATION: [address] [place] [address]                   Office [phone]   [facility] LOCATION: [address] [place], [place] [address] Office [phone]

## 2024-03-06 ENCOUNTER — Encounter (INDEPENDENT_AMBULATORY_CARE_PROVIDER_SITE_OTHER): Payer: Self-pay

## 2024-03-06 NOTE — Telephone Encounter (Signed)
 Pt is coming in on August 7 @ 10:30 to have ABI and see Orvin Daring.

## 2024-03-06 NOTE — Telephone Encounter (Signed)
 We can see about seeing her sooner with same studies

## 2024-03-13 ENCOUNTER — Telehealth (INDEPENDENT_AMBULATORY_CARE_PROVIDER_SITE_OTHER): Payer: Self-pay

## 2024-03-13 NOTE — Telephone Encounter (Signed)
 Patient spouse left a message stating that his wife left heel is very painful when applying pressure at 8 out of 10. The left foot is painful, red, and cool. The patient has appt scheduled for 03/19/24 and was seeing if she could seen sooner or any vascular recommendations until the schedule appt time. Please Advise

## 2024-03-13 NOTE — Telephone Encounter (Signed)
 Patient and spouse has been notified with medical recommendations and verbalized understanding.

## 2024-03-14 ENCOUNTER — Emergency Department

## 2024-03-14 ENCOUNTER — Inpatient Hospital Stay
Admission: EM | Admit: 2024-03-14 | Discharge: 2024-03-22 | DRG: 270 | Disposition: A | Discharge status: Home Health | Attending: Internal Medicine | Admitting: Internal Medicine

## 2024-03-14 ENCOUNTER — Other Ambulatory Visit: Payer: Self-pay

## 2024-03-14 DIAGNOSIS — I6529 Occlusion and stenosis of unspecified carotid artery: Secondary | ICD-10-CM | POA: Diagnosis not present

## 2024-03-14 DIAGNOSIS — R7303 Prediabetes: Secondary | ICD-10-CM | POA: Diagnosis present

## 2024-03-14 DIAGNOSIS — Z9981 Dependence on supplemental oxygen: Secondary | ICD-10-CM

## 2024-03-14 DIAGNOSIS — Z7902 Long term (current) use of antithrombotics/antiplatelets: Secondary | ICD-10-CM | POA: Diagnosis not present

## 2024-03-14 DIAGNOSIS — R062 Wheezing: Secondary | ICD-10-CM | POA: Diagnosis not present

## 2024-03-14 DIAGNOSIS — E43 Unspecified severe protein-calorie malnutrition: Secondary | ICD-10-CM | POA: Diagnosis present

## 2024-03-14 DIAGNOSIS — Z87891 Personal history of nicotine dependence: Secondary | ICD-10-CM

## 2024-03-14 DIAGNOSIS — D62 Acute posthemorrhagic anemia: Secondary | ICD-10-CM | POA: Diagnosis not present

## 2024-03-14 DIAGNOSIS — Z66 Do not resuscitate: Secondary | ICD-10-CM | POA: Diagnosis not present

## 2024-03-14 DIAGNOSIS — S41111A Laceration without foreign body of right upper arm, initial encounter: Secondary | ICD-10-CM | POA: Diagnosis present

## 2024-03-14 DIAGNOSIS — Z7189 Other specified counseling: Secondary | ICD-10-CM | POA: Diagnosis not present

## 2024-03-14 DIAGNOSIS — J90 Pleural effusion, not elsewhere classified: Secondary | ICD-10-CM | POA: Diagnosis not present

## 2024-03-14 DIAGNOSIS — Z9889 Other specified postprocedural states: Secondary | ICD-10-CM

## 2024-03-14 DIAGNOSIS — T82856A Stenosis of peripheral vascular stent, initial encounter: Secondary | ICD-10-CM | POA: Diagnosis not present

## 2024-03-14 DIAGNOSIS — J9611 Chronic respiratory failure with hypoxia: Secondary | ICD-10-CM | POA: Diagnosis present

## 2024-03-14 DIAGNOSIS — J479 Bronchiectasis, uncomplicated: Secondary | ICD-10-CM | POA: Diagnosis not present

## 2024-03-14 DIAGNOSIS — J9621 Acute and chronic respiratory failure with hypoxia: Secondary | ICD-10-CM | POA: Diagnosis not present

## 2024-03-14 DIAGNOSIS — Z48813 Encounter for surgical aftercare following surgery on the respiratory system: Secondary | ICD-10-CM | POA: Diagnosis not present

## 2024-03-14 DIAGNOSIS — R0602 Shortness of breath: Secondary | ICD-10-CM | POA: Diagnosis not present

## 2024-03-14 DIAGNOSIS — Z7951 Long term (current) use of inhaled steroids: Secondary | ICD-10-CM

## 2024-03-14 DIAGNOSIS — R140 Abdominal distension (gaseous): Secondary | ICD-10-CM | POA: Diagnosis not present

## 2024-03-14 DIAGNOSIS — Z7982 Long term (current) use of aspirin: Secondary | ICD-10-CM | POA: Diagnosis not present

## 2024-03-14 DIAGNOSIS — K5669 Other partial intestinal obstruction: Secondary | ICD-10-CM | POA: Diagnosis not present

## 2024-03-14 DIAGNOSIS — K7581 Nonalcoholic steatohepatitis (NASH): Secondary | ICD-10-CM | POA: Diagnosis present

## 2024-03-14 DIAGNOSIS — I998 Other disorder of circulatory system: Secondary | ICD-10-CM | POA: Diagnosis not present

## 2024-03-14 DIAGNOSIS — N179 Acute kidney failure, unspecified: Secondary | ICD-10-CM | POA: Diagnosis present

## 2024-03-14 DIAGNOSIS — J441 Chronic obstructive pulmonary disease with (acute) exacerbation: Principal | ICD-10-CM | POA: Diagnosis present

## 2024-03-14 DIAGNOSIS — J9811 Atelectasis: Secondary | ICD-10-CM | POA: Diagnosis not present

## 2024-03-14 DIAGNOSIS — I4510 Unspecified right bundle-branch block: Secondary | ICD-10-CM | POA: Diagnosis present

## 2024-03-14 DIAGNOSIS — Z79899 Other long term (current) drug therapy: Secondary | ICD-10-CM | POA: Diagnosis not present

## 2024-03-14 DIAGNOSIS — J4489 Other specified chronic obstructive pulmonary disease: Secondary | ICD-10-CM | POA: Diagnosis not present

## 2024-03-14 DIAGNOSIS — K56609 Unspecified intestinal obstruction, unspecified as to partial versus complete obstruction: Secondary | ICD-10-CM | POA: Insufficient documentation

## 2024-03-14 DIAGNOSIS — Z8551 Personal history of malignant neoplasm of bladder: Secondary | ICD-10-CM

## 2024-03-14 DIAGNOSIS — K6389 Other specified diseases of intestine: Secondary | ICD-10-CM | POA: Diagnosis not present

## 2024-03-14 DIAGNOSIS — J439 Emphysema, unspecified: Secondary | ICD-10-CM | POA: Diagnosis present

## 2024-03-14 DIAGNOSIS — I10 Essential (primary) hypertension: Secondary | ICD-10-CM | POA: Diagnosis present

## 2024-03-14 DIAGNOSIS — E875 Hyperkalemia: Secondary | ICD-10-CM | POA: Diagnosis present

## 2024-03-14 DIAGNOSIS — T82868A Thrombosis of vascular prosthetic devices, implants and grafts, initial encounter: Secondary | ICD-10-CM | POA: Diagnosis not present

## 2024-03-14 DIAGNOSIS — S2242XA Multiple fractures of ribs, left side, initial encounter for closed fracture: Secondary | ICD-10-CM | POA: Diagnosis not present

## 2024-03-14 DIAGNOSIS — J440 Chronic obstructive pulmonary disease with acute lower respiratory infection: Secondary | ICD-10-CM | POA: Diagnosis present

## 2024-03-14 DIAGNOSIS — Z681 Body mass index (BMI) 19 or less, adult: Secondary | ICD-10-CM | POA: Diagnosis not present

## 2024-03-14 DIAGNOSIS — S51011A Laceration without foreign body of right elbow, initial encounter: Secondary | ICD-10-CM | POA: Diagnosis present

## 2024-03-14 DIAGNOSIS — S0993XA Unspecified injury of face, initial encounter: Secondary | ICD-10-CM

## 2024-03-14 DIAGNOSIS — R918 Other nonspecific abnormal finding of lung field: Secondary | ICD-10-CM | POA: Diagnosis not present

## 2024-03-14 DIAGNOSIS — Y92002 Bathroom of unspecified non-institutional (private) residence single-family (private) house as the place of occurrence of the external cause: Secondary | ICD-10-CM

## 2024-03-14 DIAGNOSIS — W010XXA Fall on same level from slipping, tripping and stumbling without subsequent striking against object, initial encounter: Secondary | ICD-10-CM | POA: Diagnosis present

## 2024-03-14 DIAGNOSIS — Z515 Encounter for palliative care: Secondary | ICD-10-CM | POA: Diagnosis not present

## 2024-03-14 DIAGNOSIS — K5900 Constipation, unspecified: Secondary | ICD-10-CM | POA: Diagnosis present

## 2024-03-14 DIAGNOSIS — W19XXXA Unspecified fall, initial encounter: Secondary | ICD-10-CM

## 2024-03-14 DIAGNOSIS — R64 Cachexia: Secondary | ICD-10-CM | POA: Diagnosis not present

## 2024-03-14 DIAGNOSIS — J323 Chronic sphenoidal sinusitis: Secondary | ICD-10-CM | POA: Diagnosis not present

## 2024-03-14 DIAGNOSIS — K8689 Other specified diseases of pancreas: Secondary | ICD-10-CM | POA: Diagnosis not present

## 2024-03-14 DIAGNOSIS — E785 Hyperlipidemia, unspecified: Secondary | ICD-10-CM | POA: Diagnosis present

## 2024-03-14 DIAGNOSIS — M5031 Other cervical disc degeneration,  high cervical region: Secondary | ICD-10-CM | POA: Diagnosis not present

## 2024-03-14 DIAGNOSIS — Z86008 Personal history of in-situ neoplasm of other site: Secondary | ICD-10-CM

## 2024-03-14 DIAGNOSIS — K7460 Unspecified cirrhosis of liver: Secondary | ICD-10-CM | POA: Diagnosis not present

## 2024-03-14 DIAGNOSIS — R54 Age-related physical debility: Secondary | ICD-10-CM | POA: Diagnosis present

## 2024-03-14 DIAGNOSIS — K56699 Other intestinal obstruction unspecified as to partial versus complete obstruction: Secondary | ICD-10-CM | POA: Diagnosis present

## 2024-03-14 DIAGNOSIS — G4700 Insomnia, unspecified: Secondary | ICD-10-CM | POA: Diagnosis present

## 2024-03-14 DIAGNOSIS — Z043 Encounter for examination and observation following other accident: Secondary | ICD-10-CM | POA: Diagnosis not present

## 2024-03-14 DIAGNOSIS — I70222 Atherosclerosis of native arteries of extremities with rest pain, left leg: Principal | ICD-10-CM | POA: Diagnosis present

## 2024-03-14 DIAGNOSIS — Z723 Lack of physical exercise: Secondary | ICD-10-CM

## 2024-03-14 DIAGNOSIS — K760 Fatty (change of) liver, not elsewhere classified: Secondary | ICD-10-CM | POA: Diagnosis not present

## 2024-03-14 DIAGNOSIS — K861 Other chronic pancreatitis: Secondary | ICD-10-CM | POA: Diagnosis not present

## 2024-03-14 DIAGNOSIS — J189 Pneumonia, unspecified organism: Secondary | ICD-10-CM | POA: Diagnosis present

## 2024-03-14 DIAGNOSIS — M79605 Pain in left leg: Secondary | ICD-10-CM | POA: Diagnosis not present

## 2024-03-14 DIAGNOSIS — Z4682 Encounter for fitting and adjustment of non-vascular catheter: Secondary | ICD-10-CM | POA: Diagnosis not present

## 2024-03-14 DIAGNOSIS — Z9221 Personal history of antineoplastic chemotherapy: Secondary | ICD-10-CM

## 2024-03-14 DIAGNOSIS — M799 Soft tissue disorder, unspecified: Secondary | ICD-10-CM | POA: Diagnosis not present

## 2024-03-14 DIAGNOSIS — R069 Unspecified abnormalities of breathing: Secondary | ICD-10-CM | POA: Diagnosis not present

## 2024-03-14 DIAGNOSIS — N1832 Chronic kidney disease, stage 3b: Secondary | ICD-10-CM | POA: Diagnosis not present

## 2024-03-14 DIAGNOSIS — M810 Age-related osteoporosis without current pathological fracture: Secondary | ICD-10-CM | POA: Diagnosis present

## 2024-03-14 LAB — COMPREHENSIVE METABOLIC PANEL WITH GFR
ALT: 19 U/L (ref 0–44)
AST: 20 U/L (ref 15–41)
Albumin: 3.5 g/dL (ref 3.5–5.0)
Alkaline Phosphatase: 89 U/L (ref 38–126)
Anion gap: 11 (ref 5–15)
BUN: 21 mg/dL (ref 8–23)
CO2: 25 mmol/L (ref 22–32)
Calcium: 9.2 mg/dL (ref 8.9–10.3)
Chloride: 104 mmol/L (ref 98–111)
Creatinine, Ser: 0.64 mg/dL (ref 0.44–1.00)
GFR, Estimated: >60 mL/min (ref >60)
Glucose, Bld: 176 mg/dL — ABNORMAL HIGH (ref 70–99)
Potassium: 4.7 mmol/L (ref 3.5–5.1)
Sodium: 140 mmol/L (ref 135–145)
Total Bilirubin: 0.4 mg/dL (ref 0.0–1.2)
Total Protein: 5.9 g/dL — ABNORMAL LOW (ref 6.5–8.1)

## 2024-03-14 LAB — BLOOD GAS, VENOUS
Acid-base deficit: 2.3 mmol/L — ABNORMAL HIGH (ref 0.0–2.0)
Bicarbonate: 25 mmol/L (ref 20.0–28.0)
O2 Saturation: 77.6 %
Patient temperature: 37
pCO2, Ven: 52 mmHg (ref 44–60)
pH, Ven: 7.29 (ref 7.25–7.43)
pO2, Ven: 51 mmHg — ABNORMAL HIGH (ref 32–45)

## 2024-03-14 LAB — CBC
HCT: 40.6 % (ref 36.0–46.0)
Hemoglobin: 12.9 g/dL (ref 12.0–15.0)
MCH: 30.9 pg (ref 26.0–34.0)
MCHC: 31.8 g/dL (ref 30.0–36.0)
MCV: 97.1 fL (ref 80.0–100.0)
Platelets: 390 K/uL (ref 150–400)
RBC: 4.18 MIL/uL (ref 3.87–5.11)
RDW: 13.7 % (ref 11.5–15.5)
WBC: 11.7 K/uL — ABNORMAL HIGH (ref 4.0–10.5)
nRBC: 0 % (ref 0.0–0.2)

## 2024-03-14 LAB — TROPONIN I (HIGH SENSITIVITY): Troponin I (High Sensitivity): 7 ng/L (ref <18)

## 2024-03-14 LAB — PROTIME-INR
INR: 1 (ref 0.8–1.2)
Prothrombin Time: 13.5 s (ref 11.4–15.2)

## 2024-03-14 LAB — EXPECTORATED SPUTUM ASSESSMENT W GRAM STAIN, RFLX TO RESP C

## 2024-03-14 LAB — HEPARIN LEVEL (UNFRACTIONATED): Heparin Unfractionated: 0.46 [IU]/mL (ref 0.30–0.70)

## 2024-03-14 LAB — LACTIC ACID, PLASMA
Lactic Acid, Venous: 2 mmol/L (ref 0.5–1.9)
Lactic Acid, Venous: 1.7 mmol/L (ref 0.5–1.9)

## 2024-03-14 LAB — APTT: aPTT: 30 s (ref 24–36)

## 2024-03-14 LAB — BRAIN NATRIURETIC PEPTIDE: B Natriuretic Peptide: 92.6 pg/mL (ref 0.0–100.0)

## 2024-03-14 MED ORDER — HEPARIN BOLUS VIA INFUSION
2400.0000 [IU] | Freq: Once | INTRAVENOUS | Status: AC
  Administered 2024-03-14: 2400 [IU] via INTRAVENOUS
  Filled 2024-03-14: qty 2400

## 2024-03-14 MED ORDER — ONDANSETRON HCL 4 MG PO TABS: 4.0000 mg | ORAL_TABLET | Freq: Four times a day (QID) | ORAL | Status: DC | PRN

## 2024-03-14 MED ORDER — SODIUM CHLORIDE 0.9 % IV SOLN: 500.0000 mg | INTRAVENOUS | Status: DC

## 2024-03-14 MED ORDER — HEPARIN (PORCINE) 25000 UT/250ML-% IV SOLN
650.0000 [IU]/h | INTRAVENOUS | Status: DC
  Administered 2024-03-14 – 2024-03-15 (×2): 650 [IU]/h via INTRAVENOUS
  Filled 2024-03-14 (×2): qty 250

## 2024-03-14 MED ORDER — OXYCODONE HCL 5 MG PO TABS
5.0000 mg | ORAL_TABLET | ORAL | Status: DC | PRN
  Administered 2024-03-15 – 2024-03-21 (×12): 5 mg via ORAL
  Filled 2024-03-14 (×12): qty 1

## 2024-03-14 MED ORDER — MORPHINE SULFATE (PF) 2 MG/ML IV SOLN
2.0000 mg | Freq: Once | INTRAVENOUS | Status: AC
  Administered 2024-03-14: 2 mg via INTRAVENOUS
  Filled 2024-03-14: qty 1

## 2024-03-14 MED ORDER — IPRATROPIUM-ALBUTEROL 0.5-2.5 (3) MG/3ML IN SOLN
6.0000 mL | Freq: Once | RESPIRATORY_TRACT | Status: AC
  Administered 2024-03-14: 6 mL via RESPIRATORY_TRACT
  Filled 2024-03-14: qty 6

## 2024-03-14 MED ORDER — ONDANSETRON HCL 4 MG/2ML IJ SOLN
4.0000 mg | Freq: Four times a day (QID) | INTRAMUSCULAR | Status: DC | PRN
  Administered 2024-03-15 – 2024-03-21 (×3): 4 mg via INTRAVENOUS
  Filled 2024-03-14 (×3): qty 2

## 2024-03-14 MED ORDER — SODIUM CHLORIDE 0.9 % IV SOLN
2.0000 g | Freq: Once | INTRAVENOUS | Status: AC
  Administered 2024-03-14: 2 g via INTRAVENOUS
  Filled 2024-03-14: qty 12.5

## 2024-03-14 MED ORDER — LACTATED RINGERS IV SOLN: INTRAVENOUS | Status: AC

## 2024-03-14 MED ORDER — METHYLPREDNISOLONE SODIUM SUCC 40 MG IJ SOLR
40.0000 mg | Freq: Two times a day (BID) | INTRAMUSCULAR | Status: AC
  Administered 2024-03-14 – 2024-03-15 (×2): 40 mg via INTRAVENOUS
  Filled 2024-03-14 (×2): qty 1

## 2024-03-14 MED ORDER — ATORVASTATIN CALCIUM 10 MG PO TABS
10.0000 mg | ORAL_TABLET | Freq: Every day | ORAL | Status: DC
  Administered 2024-03-14 – 2024-03-21 (×7): 10 mg via ORAL
  Filled 2024-03-14 (×7): qty 1

## 2024-03-14 MED ORDER — METHYLPREDNISOLONE SODIUM SUCC 125 MG IJ SOLR
125.0000 mg | Freq: Once | INTRAMUSCULAR | Status: AC
  Administered 2024-03-14: 125 mg via INTRAVENOUS
  Filled 2024-03-14: qty 2

## 2024-03-14 MED ORDER — SODIUM CHLORIDE 0.9 % IV SOLN
2.0000 g | Freq: Two times a day (BID) | INTRAVENOUS | Status: DC
  Administered 2024-03-14 – 2024-03-17 (×6): 2 g via INTRAVENOUS
  Filled 2024-03-14 (×6): qty 12.5

## 2024-03-14 MED ORDER — DOXYCYCLINE HYCLATE 100 MG PO TABS
100.0000 mg | ORAL_TABLET | Freq: Two times a day (BID) | ORAL | Status: DC
Start: 2024-03-14 — End: 2024-03-14

## 2024-03-14 MED ORDER — BUDESON-GLYCOPYRROL-FORMOTEROL 160-9-4.8 MCG/ACT IN AERO
2.0000 | INHALATION_SPRAY | Freq: Every day | RESPIRATORY_TRACT | Status: DC
  Administered 2024-03-14: 2 via RESPIRATORY_TRACT
  Filled 2024-03-14: qty 5.9

## 2024-03-14 MED ORDER — DOCUSATE SODIUM 100 MG PO CAPS
100.0000 mg | ORAL_CAPSULE | Freq: Two times a day (BID) | ORAL | Status: DC
  Administered 2024-03-14 – 2024-03-15 (×2): 100 mg via ORAL
  Filled 2024-03-14 (×2): qty 1

## 2024-03-14 MED ORDER — GUAIFENESIN ER 600 MG PO TB12
600.0000 mg | ORAL_TABLET | Freq: Two times a day (BID) | ORAL | Status: DC
  Administered 2024-03-16 – 2024-03-22 (×12): 600 mg via ORAL
  Filled 2024-03-14 (×13): qty 1

## 2024-03-14 MED ORDER — VERAPAMIL HCL ER 180 MG PO TBCR
360.0000 mg | EXTENDED_RELEASE_TABLET | Freq: Every day | ORAL | Status: DC
  Administered 2024-03-14 – 2024-03-22 (×8): 360 mg via ORAL
  Filled 2024-03-14 (×9): qty 2

## 2024-03-14 MED ORDER — ALBUTEROL SULFATE (2.5 MG/3ML) 0.083% IN NEBU
2.5000 mg | INHALATION_SOLUTION | RESPIRATORY_TRACT | Status: DC | PRN
  Administered 2024-03-15: 2.5 mg via RESPIRATORY_TRACT
  Filled 2024-03-14: qty 3

## 2024-03-14 MED ORDER — MORPHINE SULFATE (PF) 2 MG/ML IV SOLN
2.0000 mg | INTRAVENOUS | Status: DC | PRN
  Administered 2024-03-14 – 2024-03-21 (×10): 2 mg via INTRAVENOUS
  Filled 2024-03-14 (×10): qty 1

## 2024-03-14 MED ORDER — PREDNISONE 20 MG PO TABS: 40.0000 mg | ORAL_TABLET | Freq: Every day | ORAL | Status: DC

## 2024-03-14 MED ORDER — ASPIRIN 81 MG PO TBEC
81.0000 mg | DELAYED_RELEASE_TABLET | Freq: Every day | ORAL | Status: DC
  Administered 2024-03-14 – 2024-03-22 (×8): 81 mg via ORAL
  Filled 2024-03-14 (×8): qty 1

## 2024-03-14 MED ORDER — LOSARTAN POTASSIUM 25 MG PO TABS
25.0000 mg | ORAL_TABLET | Freq: Every day | ORAL | Status: DC
  Administered 2024-03-14 – 2024-03-18 (×4): 25 mg via ORAL
  Filled 2024-03-14 (×4): qty 1

## 2024-03-14 MED ORDER — CLOPIDOGREL BISULFATE 75 MG PO TABS
75.0000 mg | ORAL_TABLET | Freq: Every day | ORAL | Status: DC
  Administered 2024-03-14 – 2024-03-22 (×8): 75 mg via ORAL
  Filled 2024-03-14 (×8): qty 1

## 2024-03-14 MED ORDER — IPRATROPIUM-ALBUTEROL 0.5-2.5 (3) MG/3ML IN SOLN
3.0000 mL | Freq: Four times a day (QID) | RESPIRATORY_TRACT | Status: DC
  Administered 2024-03-14 – 2024-03-16 (×7): 3 mL via RESPIRATORY_TRACT
  Filled 2024-03-14 (×9): qty 3

## 2024-03-14 MED ORDER — SODIUM CHLORIDE 0.9 % IV SOLN
500.0000 mg | INTRAVENOUS | Status: DC
  Administered 2024-03-14 – 2024-03-17 (×4): 500 mg via INTRAVENOUS
  Filled 2024-03-14 (×4): qty 5

## 2024-03-14 MED ORDER — MAGNESIUM SULFATE 2 GM/50ML IV SOLN
2.0000 g | Freq: Once | INTRAVENOUS | Status: AC
  Administered 2024-03-14: 2 g via INTRAVENOUS
  Filled 2024-03-14: qty 50

## 2024-03-14 NOTE — ED Notes (Signed)
 Pillow placed as pad in place of heel pad.

## 2024-03-14 NOTE — Consult Note (Signed)
 [facility] Vascular Consult Note  MRN : [id number]  Misty Ferguson is a 89 y.o. (Jan 06, 1934) female who presents with chief complaint of  Chief Complaint  Patient presents with   Respiratory Distress  .  History of Present Illness: I am asked to see the patient by Dr. Suzanne in the [facility] emergency department for ischemia of the left lower extremity.  The patient is an 89 year old female who is well-known to our service.  She saw us  in the office earlier this year for her known peripheral arterial disease and has undergone multiple previous revascularizations of the left lower extremity in the past.  The most recent was almost 1 year ago.  She had normal perfusion when checked earlier this year in the office.  Over the past week or so, she has had worsening left lower extremity pain.  She has clear rest pain now of the left lower extremity.  Her left heel and forefoot are very painful.  The foot is cool.  She was on aspirin , Plavix , and Lipitor at home.  This morning, she tried to get up and go to the bathroom and her left foot was so numb and painful that she fell.  Her trauma workup appears negative.  She has significant COPD and is being admitted for COPD exacerbation currently.  We are consulted due to the ischemic situation of the left lower extremity.  Current Facility-Administered Medications  Medication Dose Route Frequency Provider Last Rate Last Admin   azithromycin  (ZITHROMAX ) 500 mg in sodium chloride  0.9 % 250 mL IVPB  500 mg Intravenous Q24H Mumma, Clotilda, MD   Stopped at 03/14/24 1148   heparin  ADULT infusion 100 units/mL (25000 units/250mL)  650 Units/hr Intravenous Continuous Hallaji, Sheema M, RPH 6.5 mL/hr at 03/14/24 1138 650 Units/hr at 03/14/24 1138   lactated ringers  infusion   Intravenous Continuous Mumma, Shannon, MD 150 mL/hr at 03/14/24 1044 New Bag at 03/14/24 1044   Current Outpatient Medications  Medication Sig  Dispense Refill   Acetylcysteine (NAC 600 PO) Take by mouth.     albuterol  (PROVENTIL ) (2.5 MG/3ML) 0.083% nebulizer solution INHALE THE CONTENTS OF 1 VIAL VIA NEBULIZER EVERY 4 HOURS AS NEEDED FOR WHEEZING OR SHORTNESS OF BREATH. 450 mL 10   albuterol  (VENTOLIN  HFA) 108 (90 Base) MCG/ACT inhaler Inhale 2 puffs into the lungs every 6 (six) hours as needed for wheezing or shortness of breath. 8 g 6   ascorbic acid (VITAMIN C) 1000 MG tablet Take 1,000 mg by mouth daily.      aspirin  EC 81 MG tablet Take 1 tablet (81 mg total) by mouth daily. Swallow whole. 150 tablet 2   atorvastatin  (LIPITOR) 10 MG tablet TAKE 1 TABLET EVERY DAY 90 tablet 3   Azelastine  HCl 137 MCG/SPRAY SOLN Place 2 sprays into both nostrils 2 (two) times daily. 100 mL 5   budesonide -glycopyrrolate -formoterol  (BREZTRI  AEROSPHERE) 160-9-4.8 MCG/ACT AERO inhaler Inhale 2 puffs into the lungs in the morning and at bedtime.     budesonide -glycopyrrolate -formoterol  (BREZTRI  AEROSPHERE) 160-9-4.8 MCG/ACT AERO inhaler Inhale 2 puffs into the lungs 2 (two) times daily. 3 each 3   Cholecalciferol  (VITAMIN D ) 125 MCG (5000 UT) CAPS Take 5,000 Units by mouth daily.     clopidogrel  (PLAVIX ) 75 MG tablet TAKE 1 TABLET EVERY DAY 90 tablet 3   Coenzyme Q10 (COQ10) 200 MG CAPS Take by mouth.     docusate sodium  (COLACE) 100 MG capsule Take 100 mg by mouth 2 (  two) times daily.     Ensifentrine  (OHTUVAYRE ) 3 MG/2.5ML SUSP Inhale 1 Act into the lungs 2 (two) times daily. 1500 mL 1   guaiFENesin  (MUCINEX ) 600 MG 12 hr tablet Take 600 mg by mouth 2 (two) times daily.     losartan  (COZAAR ) 25 MG tablet 25 mg daily.      Multiple Vitamins-Minerals (PRESERVISION AREDS) TABS Take 1 tablet by mouth 2 (two) times daily.     OXYGEN  Inhale 4 L into the lungs daily in the afternoon.     Pari LC Sprint Nebulizer Set MISC      verapamil  (VERELAN  PM) 360 MG 24 hr capsule Take 360 mg by mouth daily.     Vitamin E 180 MG CAPS Take 180 mg by mouth daily.       Past Medical History:  Diagnosis Date   AK (actinic keratosis) 12/08/2020   right pretibia inferior bx proven, LN2 01/10/21   Burping    Chronic airway obstruction, not elsewhere classified    Dyspnea    Dysrhythmia    Macular degeneration (senile) of retina, unspecified    Malignant neoplasm of urinary bladder (HCC) 03/2019   Partial bladder resection and Rad tx's.    Obstructive chronic bronchitis with exacerbation (HCC)    Osteoporosis, unspecified    Oxygen  deficiency    Personal history of peptic ulcer disease    SCC (squamous cell carcinoma) 12/08/2020   right pretibia superior, EDC 01/10/21   SCC (squamous cell carcinoma) 07/03/2022   left medial calf, clear 09/25/22   SCC (squamous cell carcinoma) 11/22/2022   right thigh   treated with ED&C   Squamous cell carcinoma in situ (SCCIS) 07/03/2022   right pretibia sccis needs treatment with fluorouracil    Tobacco use disorder    Unspecified essential hypertension    Unspecified glaucoma(365.9)     Past Surgical History:  Procedure Laterality Date   CATARACT EXTRACTION W/ INTRAOCULAR LENS  IMPLANT, BILATERAL     CYSTOSCOPY WITH STENT PLACEMENT Left 05/22/2019   Procedure: CYSTOSCOPY WITH STENT PLACEMENT;  Surgeon: Francisca Redell BROCKS, MD;  Location: [facility] ORS;  Service: Urology;  Laterality: Left;   ELBOW FRACTURE SURGERY Left    LOWER EXTREMITY ANGIOGRAPHY Right 11/20/2021   Procedure: Lower Extremity Angiography;  Surgeon: Marea Selinda RAMAN, MD;  Location: [facility] INVASIVE CV LAB;  Service: Cardiovascular;  Laterality: Right;   LOWER EXTREMITY ANGIOGRAPHY Left 09/03/2022   Procedure: Lower Extremity Angiography;  Surgeon: Marea Selinda RAMAN, MD;  Location: [facility] INVASIVE CV LAB;  Service: Cardiovascular;  Laterality: Left;   LOWER EXTREMITY ANGIOGRAPHY Left 06/03/2023   Procedure: Lower Extremity Angiography;  Surgeon: Marea Selinda RAMAN, MD;  Location: [facility] INV[facility]IVE CV LAB;  Service: Cardiovascular;  Laterality: Left;   TRANSURETHRAL RESECTION  OF BLADDER TUMOR N/A 05/22/2019   Procedure: TRANSURETHRAL RESECTION OF BLADDER TUMOR (TURBT);  Surgeon: Francisca Redell BROCKS, MD;  Location: [facility] ORS;  Service: Urology;  Laterality: N/A;     Social History   Tobacco Use   Smoking status: Former    Current packs/day: 0.00    Average packs/day: 1.3 packs/day for 45.0 years (58.5 ttl pk-yrs)    Types: Cigarettes    Start date: 72    Quit date: 1998    Years since quitting: 27.6    Passive exposure: Past   Smokeless tobacco: Never  Vaping Use   Vaping status: Never Used  Substance Use Topics   Alcohol use: Yes    Alcohol/week: 14.0 standard drinks of alcohol  Types: 14 Glasses of wine per week    Comment: 1x/day   Drug use: No     Family History  Adopted: Yes    No Known Allergies   REVIEW OF SYSTEMS (Negative unless checked)  Constitutional: [] Weight loss  [] Fever  [] Chills Cardiac: [] Chest pain   [] Chest pressure   [] Palpitations   [] Shortness of breath when laying flat   [x] Shortness of breath at rest   [x] Shortness of breath with exertion. Vascular:  [x] Pain in legs with walking   [] Pain in legs at rest   [] Pain in legs when laying flat   [x] Claudication   [] Pain in feet when walking  [x] Pain in feet at rest  [x] Pain in feet when laying flat   [] History of DVT   [] Phlebitis   [] Swelling in legs   [] Varicose veins   [] Non-healing ulcers Pulmonary:   [] Uses home oxygen    [] Productive cough   [] Hemoptysis   [] Wheeze  [x] COPD   [] Asthma Neurologic:  [] Dizziness  [] Blackouts   [] Seizures   [] History of stroke   [] History of TIA  [] Aphasia   [] Temporary blindness   [] Dysphagia   [] Weakness or numbness in arms   [] Weakness or numbness in legs Musculoskeletal:  [] Arthritis   [] Joint swelling   [] Joint pain   [] Low back pain Hematologic:  [x] Easy bruising  [] Easy bleeding   [] Hypercoagulable state   [] Anemic  [] Hepatitis Gastrointestinal:  [] Blood in stool   [] Vomiting blood  [] Gastroesophageal reflux/heartburn   [] Difficulty  swallowing. Genitourinary:  [x] Chronic kidney disease   [] Difficult urination  [] Frequent urination  [] Burning with urination   [] Blood in urine Skin:  [] Rashes   [] Ulcers   [] Wounds Psychological:  [] History of anxiety   []  History of major depression.  Physical Examination  Vitals:   03/14/24 1000 03/14/24 1051 03/14/24 1200 03/14/24 1215  BP: (!) 158/68  134/63   Pulse: 89  88 88  Resp: (!) 24  18 20   Temp:  97.7 F (36.5 C)    TempSrc:  Oral    SpO2: 99%  97% 97%  Weight:      Height:       Body mass index is 16.82 kg/m. Gen:   NAD, thin and frail appearing Head: Significant facial bruising is present.  + temporalis wasting.  Ear/Nose/Throat: Hearing grossly intact, nares w/o erythema or drainage, oropharynx w/o Erythema/Exudate Eyes: Sclera non-icteric, conjunctiva clear Neck: Trachea midline.  No JVD.  Pulmonary:  Good air movement, mildly labored on supplemental oxygen  currently. Cardiac: RRR, normal S1, S2. Vascular:  Vessel Right Left  Radial Palpable Palpable                          PT 2+ palpable Not palpable  DP 1+ palpable Not palpable   Gastrointestinal: soft, non-tender/non-distended. No guarding/reflex.  Musculoskeletal: M/S 5/5 throughout.  Left foot is cool with sluggish capillary refill.  Significant stasis changes are present bilaterally.  No open wounds. Neurologic: Sensation grossly intact in extremities.  Symmetrical.  Speech is fluent. Motor exam as listed above. Psychiatric: Judgment intact, Mood & affect appropriate for pt's clinical situation. Dermatologic: No rashes or ulcers noted.  No cellulitis or open wounds.      CBC Lab Results  Component Value Date   WBC 11.7 (H) 03/14/2024   HGB 12.9 03/14/2024   HCT 40.6 03/14/2024   MCV 97.1 03/14/2024   PLT 390 03/14/2024    BMET    Component Value Date/Time  NA 140 03/14/2024 0709   NA 141 06/10/2019 1324   K 4.7 03/14/2024 0709   K 4.1 07/23/2012 1536   CL 104 03/14/2024  0709   CO2 25 03/14/2024 0709   GLUCOSE 176 (H) 03/14/2024 0709   BUN 21 03/14/2024 0709   BUN 15 06/10/2019 1324   CREATININE 0.64 03/14/2024 0709   CREATININE 1.26 (H) 12/23/2012 1440   CALCIUM  9.2 03/14/2024 0709   GFRNONAA >60 03/14/2024 0709   GFRAA 62 06/10/2019 1324   Estimated Creatinine Clearance: 31.6 mL/min (by C-G formula based on SCr of 0.64 mg/dL).  COAG Lab Results  Component Value Date   INR 1.0 03/14/2024   INR 1.1 07/25/2022   INR 0.88 09/08/2017    Radiology CT Maxillofacial Wo Contrast Result Date: 03/14/2024 CLINICAL DATA:  89 year old female status post fall, respiratory distress. Skin tears. EXAM: CT MAXILLOFACIAL WITHOUT CONTRAST TECHNIQUE: Multidetector CT imaging of the maxillofacial structures was performed. Multiplanar CT image reconstructions were also generated. RADIATION DOSE REDUCTION: This exam was performed according to the departmental dose-optimization program which includes automated exposure control, adjustment of the mA and/or kV according to patient size and/or use of iterative reconstruction technique. COMPARISON:  CT head and cervical spine today.  Face CT 10/22/2017. FINDINGS: Osseous: Mandible intact and normally located. Absent maxillary dentition. Bilateral maxilla, zygoma, pterygoid, nasal bones appear stable and intact. Central skull base appears intact. Orbits: Intact orbital walls. Chronic postoperative changes to the globes. Orbits soft tissues otherwise normal. Sinuses: Advanced chronic left sphenoid sinusitis with stable mucoperiosteal thickening. Other paranasal sinuses, tympanic cavities are clear. Chronic bilateral mastoid effusions and sclerosis, stable. Soft tissues: Mild motion artifact in the deep soft tissue spaces of the face. Negative visible noncontrast thyroid , larynx, pharynx, parapharyngeal spaces, retropharyngeal space, sublingual space, submandibular, masticator and parotid spaces. Calcified cervical carotid atherosclerosis.  Limited intracranial: Stable to that reported separately. IMPRESSION: 1. No acute traumatic injury identified in the Face. 2. Stable chronic left sphenoid sinusitis, chronic bilateral mastoid inflammation. Electronically Signed   By: VEAR Hurst M.D.   On: 03/14/2024 09:18   CT Cervical Spine Wo Contrast Result Date: 03/14/2024 CLINICAL DATA:  89 year old female status post fall, respiratory distress. Skin tears. EXAM: CT CERVICAL SPINE WITHOUT CONTRAST TECHNIQUE: Multidetector CT imaging of the cervical spine was performed without intravenous contrast. Multiplanar CT image reconstructions were also generated. RADIATION DOSE REDUCTION: This exam was performed according to the departmental dose-optimization program which includes automated exposure control, adjustment of the mA and/or kV according to patient size and/or use of iterative reconstruction technique. COMPARISON:  Head and face CT today. FINDINGS: Alignment: Straightening of cervical lordosis. Cervicothoracic junction alignment is within normal limits. Bilateral posterior element alignment is within normal limits. Skull base and vertebrae: Bone mineralization is within normal limits for age. Visualized skull base is intact. No atlanto-occipital dissociation. C1 and C2 appear intact and aligned. No acute osseous abnormality identified. Soft tissues and spinal canal: No prevertebral fluid or swelling. No visible canal hematoma. Motion artifact in the noncontrast neck. Calcified carotid atherosclerosis. Disc levels: Degenerative cervical vertebral ankylosis C3-C4. Advanced facet arthropathy, chronic disc and endplate degeneration at the adjacent segments. But no significant spinal stenosis by CT. Upper chest: Moderate to large layering left pleural effusion in the left lung apex, simple fluid density favoring transudate. Underlying emphysema, moderate to severe. Negative visible noncontrast thoracic inlet soft tissues. IMPRESSION: 1. No acute traumatic injury  identified in the cervical spine. 2. Cervical spine degeneration superimposed on degenerative C3-C4 ankylosis, but  no significant spinal stenosis by CT. 3. Emphysema (ICD10-J43.9). Moderate or possibly large layering Left Pleural Effusion suspected to be transudate. Electronically Signed   By: VEAR Hurst M.D.   On: 03/14/2024 09:16   CT Head Wo Contrast Result Date: 03/14/2024 CLINICAL DATA:  89 year old female status post fall, respiratory distress. Skin tears. EXAM: CT HEAD WITHOUT CONTRAST TECHNIQUE: Contiguous axial images were obtained from the base of the skull through the vertex without intravenous contrast. RADIATION DOSE REDUCTION: This exam was performed according to the departmental dose-optimization program which includes automated exposure control, adjustment of the mA and/or kV according to patient size and/or use of iterative reconstruction technique. COMPARISON:  Face and cervical spine CT today reported separately. Head and face CT 10/22/2017. FINDINGS: Brain: Cerebral volume not significantly changed since 2019. No midline shift, ventriculomegaly, mass effect, evidence of mass lesion, intracranial hemorrhage or evidence of cortically based acute infarction. Gray-white differentiation remains normal for age. Vascular: No suspicious intracranial vascular hyperdensity. Calcified atherosclerosis at the skull base. Stable punctate basal ganglia vascular calcifications. Skull: Stable. No acute osseous abnormality identified. Face reported separately. Sinuses/Orbits: Chronic left sphenoid sinusitis with mucoperiosteal thickening appears stable. Other paranasal sinuses well aerated. Mild chronic mastoid effusions and sclerosis stable since 2019. Tympanic cavities well aerated. Other: No orbit or scalp soft tissue injury identified. IMPRESSION: 1. No  acute traumatic injury identified. 2. Stable and negative for age noncontrast CT appearance of the brain. 3. Face CT reported separately. Electronically Signed    By: VEAR Hurst M.D.   On: 03/14/2024 09:13   DG Elbow 2 Views Right Result Date: 03/14/2024 EXAM: 1 or 2 VIEW(S) XRAY OF THE RIGHT ELBOW COMPARISON: 02/01/2023 CLINICAL HISTORY: Fall. EMS reports to have found patient sitting on the toilet with skin tears present to her right elbow and knee. Upon arrival, patient was in respiratory distress with bilateral expiratory wheezing and O2 sats at 85% on RA. History of COPD. FINDINGS: BONES AND JOINTS: No acute fracture. No focal osseous lesion. No joint dislocation. SOFT TISSUES: The soft tissues are unremarkable. IMPRESSION: 1. No acute abnormality. Electronically signed by: Waddell Calk MD 03/14/2024 08:47 AM EDT RP Workstation: [id number]   DG Knee 2 Views Right Result Date: 03/14/2024 EXAM: 1 or 2 VIEW(S) XRAY OF THE RIGHT KNEE 03/14/2024 08:33:00 AM COMPARISON: 02/01/2023 CLINICAL HISTORY: Fall. EMS reports to have found patient sitting on the toilet with skin tears present to her right elbow and knee. Upon arrival, patient was in respiratory distress with bilateral expiratory wheezing and O2 sats at 85% on RA. History of COPD. FINDINGS: BONES AND JOINTS: No acute fracture. No focal osseous lesion. No joint dislocation. No significant joint effusion. No significant degenerative changes. SOFT TISSUES: New prepatellar soft tissue thickening measures 1.2 cm in thickness. Soft tissue irregularity with mild subcutaneous superficial soft tissue gas overlying the lateral knee compatible with soft tissue injury. IMPRESSION: 1. No acute fracture or dislocation. 2. Soft tissue irregularity with mild subcutaneous superficial soft tissue gas overlying the lateral knee, compatible with soft tissue injury. 3. New prepatellar soft tissue thickening measuring 1.2 cm in thickness . Electronically signed by: Waddell Calk MD 03/14/2024 08:46 AM EDT RP Workstation: [id number]   DG Hip Unilat With Pelvis 2-3 Views Left Result Date: 03/14/2024 EXAM: 2 or 3 VIEW(S) XRAY OF THE LEFT  HIP 03/14/2024 08:33:00 AM COMPARISON: None available. CLINICAL HISTORY: Fall. EMS reports to have found patient sitting on the toilet with skin tears present to her right elbow and knee. Upon  arrival, patient was in respiratory distress with bilateral expiratory wheezing and O2 sats at 85% on RA. History of COPD. FINDINGS: BONES AND JOINTS: No acute fracture or focal osseous lesion. The hip joint is maintained. No significant degenerative changes. SOFT TISSUES: The soft tissues are unremarkable. VASCULATURE: Vascular calcifications identified. Right common iliac artery stent. IMPRESSION: 1. No acute fracture or dislocation of the left hip or visualized pelvis. Electronically signed by: Waddell Calk MD 03/14/2024 08:44 AM EDT RP Workstation: [id number]   DG Chest Port 1 View Result Date: 03/14/2024 EXAM: 1 VIEW XRAY OF THE CHEST 03/14/2024 07:12:17 AM COMPARISON: 07/25/2022 CLINICAL HISTORY: SOB, respiratory distress. EMS reports they were called for a fall. Upon arrival they report to have found patient sitting on the toilet with skin tears present to her right elbow and knee. Upon arrival patient was in respiratory distress with bilateral expiratory wheezing and O2 sats at 85% on RA. History of COPD. FINDINGS: LUNGS AND PLEURA: New small to moderate size left pleural effusion with overlying atelectasis and retrocardiac opacification. New patchy opacities noted within the central right upper lobe. Emphysema with diffuse bronchial wall thickening. HEART AND MEDIASTINUM: No acute abnormality of the cardiac and mediastinal silhouettes. Aortic atherosclerotic calcification. BONES AND SOFT TISSUES: Remote left lower anterior rib fractures. IMPRESSION: 1. New small to moderate size left pleural effusion with overlying atelectasis and retrocardiac opacification. 2. New small patchy opacities within the central right upper lobe. 3. Emphysema with diffuse bronchial wall thickening. 4. Aortic atherosclerotic  calcification. 5. Remote left lower anterior rib fractures. Electronically signed by: Waddell Calk MD 03/14/2024 07:31 AM EDT RP Workstation: [id number]      Assessment/Plan 1.  Acute on chronic ischemia of the left lower extremity with likely thrombosis of previous interventions.  Known history of severe peripheral arterial disease.  Her symptoms have now been going on for a few days to a week.  Was actually scheduled to see us  in the office in the near future.  Her fall earlier this morning was likely related to pain and numbness from ischemia of the left lower extremity.  I have discussed the case with the emergency room physician and we are going to start the patient on heparin .  She is being admitted for COPD exacerbation and other issues.  Has a litany of chronic medical issues.  We will keep her on heparin  and if she is medically stable I will plan to take her to the angiogram suite on Monday.  If she is not medically stable at that time, we will continue heparin  and hopefully get her down to the angiogram suite for attempted revascularization early next week.  This is a very critical and serious situation with a high risk of limb loss. 2.  COPD exacerbation.  Being admitted to hospitalist.  If she can be medically stabilized and reduce her periprocedural risks for angiography, that will be in her best interest 3.  Chronic kidney disease.  Stage IIIb.  If she will tolerate gentle hydration prior to angiogram Monday, that would reduce her risk of nephrotoxicity.  I will try to limit contrast as well although this is a critical and limb threatening situation. 4.  Facial trauma after fall.  CT of the head did not show any evidence of bleeding.  Heparin  will be her best bet of limb salvage.  This may exacerbate her bruising, but with a negative head CT this is encouraging that she will not have any life-threatening hemorrhage.   [name], MD  03/14/2024 12:20 PM    This note was created with  Dragon medical transcription system.  Any error is purely unintentional

## 2024-03-14 NOTE — ED Notes (Signed)
 Fall precautions in place for Pt. This RN placed fall band, fall grip socks, bed alarm and fall sign.

## 2024-03-14 NOTE — Progress Notes (Signed)
 PHARMACY - ANTICOAGULATION CONSULT NOTE  Pharmacy Consult for Heparin  infusion  Indication: Peripheral arterial disease, limb ischemia   No Known Allergies  Patient Measurements: Height: 5' 1 (154.9 cm) Weight: 40.4 kg (89 lb) IBW/kg (Calculated) : 47.8  Vital Signs: Temp: 96.7 F (35.9 C) (08/02 0657) Temp Source: Axillary (08/02 0657) BP: 158/68 (08/02 1000) Pulse Rate: 89 (08/02 1000)  Labs: Recent Labs    03/14/24 0709  HGB 12.9  HCT 40.6  PLT 390  CREATININE 0.64  TROPONINIHS 7    Estimated Creatinine Clearance: 31.6 mL/min (by C-G formula based on SCr of 0.64 mg/dL).   Medical History: Past Medical History:  Diagnosis Date   AK (actinic keratosis) 12/08/2020   right pretibia inferior bx proven, LN2 01/10/21   Burping    Chronic airway obstruction, not elsewhere classified    Dyspnea    Dysrhythmia    Macular degeneration (senile) of retina, unspecified    Malignant neoplasm of urinary bladder (HCC) 03/2019   Partial bladder resection and Rad tx's.    Obstructive chronic bronchitis with exacerbation (HCC)    Osteoporosis, unspecified    Oxygen  deficiency    Personal history of peptic ulcer disease    SCC (squamous cell carcinoma) 12/08/2020   right pretibia superior, EDC 01/10/21   SCC (squamous cell carcinoma) 07/03/2022   left medial calf, clear 09/25/22   SCC (squamous cell carcinoma) 11/22/2022   right thigh   treated with ED&C   Squamous cell carcinoma in situ (SCCIS) 07/03/2022   right pretibia sccis needs treatment with fluorouracil    Tobacco use disorder    Unspecified essential hypertension    Unspecified glaucoma(365.9)      Assessment: 89 yo female w/PMH of PVD, COPD, and bladder carcinoma admitted with respiratory distress and no pulse in left leg.  Pharmacy has been consulted for heparin  infusion dosing and monitoring for limb ischemia.   Goal of Therapy:  Heparin  level 0.3-0.7 units/ml Monitor platelets by anticoagulation protocol:  Yes   Plan:  Give 2500 units bolus x 1 Start heparin  infusion at 650 units/hr Check anti-Xa level in 8 hours and daily while on heparin  Continue to monitor H&H and platelets. CBC daily per protocol.   Estill CHRISTELLA Lutes, PharmD, BCPS Clinical Pharmacist 03/14/2024 10:51 AM

## 2024-03-14 NOTE — Progress Notes (Signed)
 PHARMACY - ANTICOAGULATION CONSULT NOTE  Pharmacy Consult for Heparin  infusion  Indication: Peripheral arterial disease, limb ischemia   No Known Allergies  Patient Measurements: Height: 5' 1 (154.9 cm) Weight: 40.4 kg (89 lb) IBW/kg (Calculated) : 47.8  Vital Signs: Temp: 97.7 F (36.5 C) (08/02 1859) Temp Source: Oral (08/02 1859) BP: 125/55 (08/02 1930) Pulse Rate: 80 (08/02 1930)  Labs: Recent Labs    03/14/24 0709 03/14/24 1914  HGB 12.9  --   HCT 40.6  --   PLT 390  --   APTT 30  --   LABPROT 13.5  --   INR 1.0  --   HEPARINUNFRC  --  0.46  CREATININE 0.64  --   TROPONINIHS 7  --     Estimated Creatinine Clearance: 31.6 mL/min (by C-G formula based on SCr of 0.64 mg/dL).   Medical History: Past Medical History:  Diagnosis Date   AK (actinic keratosis) 12/08/2020   right pretibia inferior bx proven, LN2 01/10/21   Burping    Chronic airway obstruction, not elsewhere classified    Dyspnea    Dysrhythmia    Macular degeneration (senile) of retina, unspecified    Malignant neoplasm of urinary bladder (HCC) 03/2019   Partial bladder resection and Rad tx's.    Obstructive chronic bronchitis with exacerbation (HCC)    Osteoporosis, unspecified    Oxygen  deficiency    Personal history of peptic ulcer disease    SCC (squamous cell carcinoma) 12/08/2020   right pretibia superior, EDC 01/10/21   SCC (squamous cell carcinoma) 07/03/2022   left medial calf, clear 09/25/22   SCC (squamous cell carcinoma) 11/22/2022   right thigh   treated with ED&C   Squamous cell carcinoma in situ (SCCIS) 07/03/2022   right pretibia sccis needs treatment with fluorouracil    Tobacco use disorder    Unspecified essential hypertension    Unspecified glaucoma(365.9)      Assessment: 89 yo female w/PMH of PVD, COPD, and bladder carcinoma admitted with respiratory distress and no pulse in left leg.  Pharmacy has been consulted for heparin  infusion dosing and monitoring for limb  ischemia.   Goal of Therapy:  Heparin  level 0.3-0.7 units/ml Monitor platelets by anticoagulation protocol: Yes  8/02 @ 1914: HL 0.46  Therapeutic X 1  Plan:  8/02 @ 1914: HL 0.46  = Therapeutic Continue heparin  infusion at 650 units/hr Recheck anti-Xa level in 8 hours and daily while on heparin  Continue to monitor H&H and platelets. CBC daily per protocol.    Will M. Lenon, PharmD Clinical Pharmacist 03/14/2024 7:52 PM

## 2024-03-14 NOTE — ED Notes (Signed)
 CCMD called and Pt added to cardiac monitoring

## 2024-03-14 NOTE — ED Provider Notes (Signed)
 [facility] Provider Note    Event Date/Time   First MD Initiated Contact with Patient 03/14/24 [phone]     (approximate)   History   Respiratory Distress   HPI  Misty Ferguson is a 89 y.o. female past medical history significant for COPD with baseline 2 L home oxygen  requirement, presents to the emergency department following a fall.  Patient was ambulating to the bathroom when she tripped and fell landing on her face and right side.  When EMS arrived she was found to be in respiratory distress and oxygen  saturation at 85% with diffuse wheezing.  Patient was given 2 albuterol  treatments prior to arrival.  Patient also has a history of PAD and has been taking Plavix  and aspirin .  States that she has been having worsening pain to her left heel and she is uncertain if this caused her to fall.  Denies passing out or hitting her head when she fell.  States that she is followed by vascular surgery and she has had 2 stents to her left leg, call their office and scheduled an appointment for this coming Thursday.  Does not take any anticoagulation.  Does states that she feels like the weather is making her more short of breath.  States that she increased her home oxygen  up to 3 L over the past 1 week.  Denies history of DVT or PE.     Physical Exam   Triage Vital Signs: ED Triage Vitals [03/14/24 0657]  Encounter Vitals Group     BP      Girls Systolic BP Percentile      Girls Diastolic BP Percentile      Boys Systolic BP Percentile      Boys Diastolic BP Percentile      Pulse Rate (!) 102     Resp (!) 28     Temp (!) 96.7 F (35.9 C)     Temp Source Axillary     SpO2 100 %     Weight      Height      Head Circumference      Peak Flow      Pain Score      Pain Loc      Pain Education      Exclude from Growth Chart     Most recent vital signs: Vitals:   03/14/24 1215 03/14/24 1444  BP:    Pulse: 88   Resp: 20   Temp:  97.7 F (36.5 C)  SpO2: 97%      Physical Exam Constitutional:      General: She is in acute distress.     Appearance: She is well-developed.  HENT:     Head: Atraumatic.  Eyes:     Conjunctiva/sclera: Conjunctivae normal.  Cardiovascular:     Rate and Rhythm: Regular rhythm. Tachycardia present.  Pulmonary:     Effort: Respiratory distress present.     Breath sounds: Wheezing and rhonchi present.  Abdominal:     General: There is no distension.     Tenderness: There is no abdominal tenderness.  Musculoskeletal:        General: Normal range of motion.     Cervical back: Normal range of motion.     Comments: No pain with range of motion to bilateral hips.  No midline cervical, thoracic or lumbar tenderness to palpation  Skin:    General: Skin is warm.     Capillary Refill: Capillary refill takes less than  2 seconds.     Comments: Large skin tear to the lateral aspect of the right knee that is hemostatic.  No involvement of the joint.  Old ecchymosis to the right knee.  Multiple areas of old bruising to bilateral lower extremities.  Skin tear to the right elbow and right second finger.  Hemostatic.  Mild erythema to the left heel with significant tenderness to palpation.  Unable to get a dopplerable pulse to the left DP.  Right DP dopplerable pulse.  Delayed capillary refill to the left foot it is colder to touch when compared to the right.  No open wounds.  Neurological:     Mental Status: She is alert. Mental status is at baseline.     IMPRESSION / MDM / ASSESSMENT AND PLAN / ED COURSE  I reviewed the triage vital signs and the nursing notes.  Differential diagnosis including COPD exacerbation, pneumonia, anemia, fracture, dislocation,  limb ischemia  On arrival with significant wheezing and increased work of breathing, was placed on BiPAP and given a DuoNeb treatment  EKG  I, Clotilda Punter, the attending physician, personally viewed and interpreted this ECG.  Significant artifact.  Sinus tachycardia  with a heart rate of 114.  QRS 164.  QTc 535.  No significant ST elevation or depression.  Significant artifact.  No findings of acute ischemia.  No tachycardic or bradycardic dysrhythmias while on cardiac telemetry.  RADIOLOGY CT scan of the head with no acute findings  CT scan of the cervical spine with no acute findings  CT scan face no acute findings  X-ray imaging with no acute fracture or dislocation  Chest x-ray with new small to moderate left pleural effusion.  New small patchy opacities to the right upper lobe.  Remote left anterior rib fractures LABS (all labs ordered are listed, but only abnormal results are displayed) Labs interpreted as -    Labs Reviewed  CBC - Abnormal; Notable for the following components:      Result Value   WBC 11.7 (*)    All other components within normal limits  COMPREHENSIVE METABOLIC PANEL WITH GFR - Abnormal; Notable for the following components:   Glucose, Bld 176 (*)    Total Protein 5.9 (*)    All other components within normal limits  BLOOD GAS, VENOUS - Abnormal; Notable for the following components:   pO2, Ven 51 (*)    Acid-base deficit 2.3 (*)    All other components within normal limits  LACTIC ACID, PLASMA - Abnormal; Notable for the following components:   Lactic Acid, Venous 2.0 (*)    All other components within normal limits  CULTURE, BLOOD (ROUTINE X 2)  CULTURE, BLOOD (ROUTINE X 2)  RESPIRATORY PANEL BY PCR  EXPECTORATED SPUTUM ASSESSMENT W GRAM STAIN, RFLX TO RESP C  BRAIN NATRIURETIC PEPTIDE  LACTIC ACID, PLASMA  APTT  PROTIME-INR  HEPARIN  LEVEL (UNFRACTIONATED)  TROPONIN I (HIGH SENSITIVITY)     MDM    Patient initially placed on BiPAP and given DuoNeb treatments.  Chest x-ray concerning for possible pneumonia with new opacities.  Patient did have tachycardia and leukocytosis we will add on blood cultures and lactic acid.  Does have an ongoing cough with increased oxygen  requirement.  Given her Pseudomonas  risk factors we will start the patient on antibiotics to cover for Pseudomonas and atypical bacteria.  Able to transition her off of BiPAP to 3 L nasal cannula.  VBG with no significant hypercarbia.  Lactic acid 2.0  Discussed  the patient's case with Dr. Marea who is her vascular surgeon given her lack of palpable pulse to the left foot.  Recommended heparin  and admission to the hospitalist for possible arteriogram.  Consulted hospitalist for admission for COPD exacerbation, community-acquired pneumonia, and limb ischemia   PROCEDURES:  Critical Care performed: yes  .Critical Care  Performed by: Suzanne Kirsch, MD Authorized by: Suzanne Kirsch, MD   Critical care provider statement:    Critical care time (minutes):  45   Critical care was necessary to treat or prevent imminent or life-threatening deterioration of the following conditions:  Circulatory failure   Critical care was time spent personally by me on the following activities:  Development of treatment plan with patient or surrogate, discussions with consultants, evaluation of patient's response to treatment, examination of patient, ordering and review of laboratory studies, ordering and review of radiographic studies, ordering and performing treatments and interventions, pulse oximetry, re-evaluation of patient's condition and review of old charts   Care discussed with: admitting provider     Patient's presentation is most consistent with acute presentation with potential threat to life or bodily function.   MEDICATIONS ORDERED IN ED: Medications  lactated ringers  infusion ( Intravenous New Bag/Given 03/14/24 1044)  azithromycin  (ZITHROMAX ) 500 mg in sodium chloride  0.9 % 250 mL IVPB (0 mg Intravenous Stopped 03/14/24 1148)  heparin  bolus via infusion 2,400 Units (2,400 Units Intravenous Bolus from Bag 03/14/24 1138)    Followed by  heparin  ADULT infusion 100 units/mL (25000 units/250mL) (650 Units/hr Intravenous New Bag/Given  03/14/24 1138)  morphine  (PF) 2 MG/ML injection 2 mg (2 mg Intravenous Given 03/14/24 1443)  aspirin  EC tablet 81 mg (has no administration in time range)  atorvastatin  (LIPITOR) tablet 10 mg (has no administration in time range)  losartan  (COZAAR ) tablet 25 mg (has no administration in time range)  verapamil  (CALAN -SR) CR tablet 360 mg (has no administration in time range)  docusate sodium  (COLACE) capsule 100 mg (has no administration in time range)  clopidogrel  (PLAVIX ) tablet 75 mg (has no administration in time range)  budesonide -glycopyrrolate -formoterol  (BREZTRI ) 160-9-4.8 MCG/ACT inhaler 2 puff (has no administration in time range)  guaiFENesin  (MUCINEX ) 12 hr tablet 600 mg (has no administration in time range)  methylPREDNISolone  sodium succinate (SOLU-MEDROL ) 40 mg/mL injection 40 mg (has no administration in time range)    Followed by  predniSONE  (DELTASONE ) tablet 40 mg (has no administration in time range)  oxyCODONE  (Oxy IR/ROXICODONE ) immediate release tablet 5 mg (has no administration in time range)  ondansetron  (ZOFRAN ) tablet 4 mg (has no administration in time range)    Or  ondansetron  (ZOFRAN ) injection 4 mg (has no administration in time range)  ipratropium-albuterol  (DUONEB) 0.5-2.5 (3) MG/3ML nebulizer solution 3 mL (has no administration in time range)  albuterol  (PROVENTIL ) (2.5 MG/3ML) 0.083% nebulizer solution 2.5 mg (has no administration in time range)  ceFEPIme  (MAXIPIME ) 2 g in sodium chloride  0.9 % 100 mL IVPB (has no administration in time range)  methylPREDNISolone  sodium succinate (SOLU-MEDROL ) 125 mg/2 mL injection 125 mg (125 mg Intravenous Given 03/14/24 0749)  magnesium  sulfate IVPB 2 g 50 mL (0 g Intravenous Stopped 03/14/24 0844)  ipratropium-albuterol  (DUONEB) 0.5-2.5 (3) MG/3ML nebulizer solution 6 mL (6 mLs Nebulization Given 03/14/24 0745)  ceFEPIme  (MAXIPIME ) 2 g in sodium chloride  0.9 % 100 mL IVPB (0 g Intravenous Stopped 03/14/24 1120)  morphine  (PF) 2  MG/ML injection 2 mg (2 mg Intravenous Given 03/14/24 1039)    FINAL CLINICAL IMPRESSION(S) / ED DIAGNOSES  Final diagnoses:  COPD exacerbation (HCC)  Pleural effusion  Community acquired pneumonia of right lung, unspecified part of lung     Rx / DC Orders   ED Discharge Orders     None        Note:  This document was prepared using Dragon voice recognition software and may include unintentional dictation errors.   Suzanne Kirsch, MD 03/14/24 [phone]

## 2024-03-14 NOTE — ED Notes (Signed)
 Dinner tray given

## 2024-03-14 NOTE — Sepsis Progress Note (Signed)
 Elink following code sepsis

## 2024-03-14 NOTE — H&P (Signed)
 History and Physical    Misty Ferguson FMW:[id number] DOB: 01/14/1934 DOA: 03/14/2024  PCP: Avelina Greig BRAVO, MD  Patient coming from: home  I have personally briefly reviewed patient's old medical records in [facility]  Chief Complaint:  sob  HPI: Misty Ferguson is a 89 y.o. female with medical history significant of Severe COPD  , Chronic bronchiectasis , chronic hypoxic respiratory failure home O2 with exertion and at bedtime ,  Pleural nodule, HTN, PAD with symptoms predominantly in the left leg , HLD, Prediabetes, bladder CA s/p TURBT 05/22/2019 s/p chemo and xrt,tobacco abuse who presents to ED BIB EMS S/p call out for a fall. However on arrival EMS noted patient was in respiratory distress.  She was found sitting on toilet with multiple skin tears noted.  Her O2 sat was 85% . Of note patient is on home O2 with exertion and at bedtime but typically maintains sats well at rest on RA. Patient was treated with albuterol   and transported to ED. Of note if patient has any head trauma. Patient does not complaint of any HA.   ED Course:  Patient on evaluation in ED was noted to have AECOPD/CAP as well as no dopplerable pulse in the  left lower extremity. AT this juncture Dr Marea was consulted who recommend heparin  for acute on chronic left lower ext limb ischemia with plans for intervention once patient is medically stable. However until then will continue on heparin  drip.  Of note patient in Ed was initially placed on bipap was able to be weaned to [facility].  Vitals: Temp 96.7, hr 102, rr 28, hr 102   bp 160/83 Vbg: 7.29/52 Wbc 11.7, hgb 12.9 Na 140, K 4.7, Cl 104, glu 176, cr 0.64 ZXH:Dpwld tachycardia , PVC , RBBB Cxr  IMPRESSION: 1. New small to moderate size left pleural effusion with overlying atelectasis and retrocardiac opacification. 2. New small patchy opacities within the central right upper lobe. 3. Emphysema with diffuse bronchial wall thickening. 4. Aortic atherosclerotic  calcification. 5. Remote left lower anterior rib fractures.  Knee xray IMPRESSION: 1. No acute fracture or dislocation. 2. Soft tissue irregularity with mild subcutaneous superficial soft tissue gas overlying the lateral knee, compatible with soft tissue injury. 3. New prepatellar soft tissue thickening measuring 1.2 cm in thickness . Hip xray MPRESSION: 1. No acute fracture or dislocation of the left hip or visualized pelvis. Tx mag , douneb , solumedrol  CTH :NAD Cervical spine: NAD  Lactic 2 repeat 1.7  Tx azithromycin , cefepime , heparin  drip,morphine  Review of Systems: As per HPI otherwise 10 point review of systems negative.   Past Medical History:  Diagnosis Date   AK (actinic keratosis) 12/08/2020   right pretibia inferior bx proven, LN2 01/10/21   Burping    Chronic airway obstruction, not elsewhere classified    Dyspnea    Dysrhythmia    Macular degeneration (senile) of retina, unspecified    Malignant neoplasm of urinary bladder (HCC) 03/2019   Partial bladder resection and Rad tx's.    Obstructive chronic bronchitis with exacerbation (HCC)    Osteoporosis, unspecified    Oxygen  deficiency    Personal history of peptic ulcer disease    SCC (squamous cell carcinoma) 12/08/2020   right pretibia superior, EDC 01/10/21   SCC (squamous cell carcinoma) 07/03/2022   left medial calf, clear 09/25/22   SCC (squamous cell carcinoma) 11/22/2022   right thigh   treated with ED&C   Squamous cell carcinoma in situ (SCCIS)  07/03/2022   right pretibia sccis needs treatment with fluorouracil    Tobacco use disorder    Unspecified essential hypertension    Unspecified glaucoma(365.9)     Past Surgical History:  Procedure Laterality Date   CATARACT EXTRACTION W/ INTRAOCULAR LENS  IMPLANT, BILATERAL     CYSTOSCOPY WITH STENT PLACEMENT Left 05/22/2019   Procedure: CYSTOSCOPY WITH STENT PLACEMENT;  Surgeon: Francisca Redell BROCKS, MD;  Location: [facility] ORS;  Service: Urology;  Laterality:  Left;   ELBOW FRACTURE SURGERY Left    LOWER EXTREMITY ANGIOGRAPHY Right 11/20/2021   Procedure: Lower Extremity Angiography;  Surgeon: Marea Selinda RAMAN, MD;  Location: [facility] INVASIVE CV LAB;  Service: Cardiovascular;  Laterality: Right;   LOWER EXTREMITY ANGIOGRAPHY Left 09/03/2022   Procedure: Lower Extremity Angiography;  Surgeon: Marea Selinda RAMAN, MD;  Location: [facility] INV[facility] CV LAB;  Service: Cardiovascular;  Laterality: Left;   LOWER EXTREMITY ANGIOGRAPHY Left 06/03/2023   Procedure: Lower Extremity Angiography;  Surgeon: Marea Selinda RAMAN, MD;  Location: [facility] CV [facility];  Service: Cardiovascular;  Laterality: Left;   TRANSURETHRAL RESECTION OF BLADDER TUMOR N/A 05/22/2019   Procedure: TRANSURETHRAL RESECTION OF BLADDER TUMOR (TURBT);  Surgeon: Francisca Redell BROCKS, MD;  Location: [facility] ORS;  Service: Urology;  Laterality: N/A;     reports that she quit smoking about 27 years ago. Her smoking use included cigarettes. She started smoking about 72 years ago. She has a 58.5 pack-year smoking history. She has been exposed to tobacco smoke. She has never used smokeless tobacco. She reports current alcohol use of about 14.0 standard drinks of alcohol per week. She reports that she does not use drugs.  No Known Allergies  Family History  Adopted: Yes    Prior to Admission medications   Medication Sig Start Date End Date Taking? Authorizing Provider  Acetylcysteine (NAC 600 PO) Take by mouth.    [provider]  albuterol  (PROVENTIL ) (2.5 MG/3ML) 0.083% nebulizer solution INHALE THE CONTENTS OF 1 VIAL VIA NEBULIZER EVERY 4 HOURS AS NEEDED FOR WHEEZING OR SHORTNESS OF BREATH. 02/07/24   Isaiah Scrivener, MD  albuterol  (VENTOLIN  HFA) 108 (90 Base) MCG/ACT inhaler Inhale 2 puffs into the lungs every 6 (six) hours as needed for wheezing or shortness of breath. 12/02/23   Isaiah Scrivener, MD  ascorbic acid (VITAMIN C) 1000 MG tablet Take 1,000 mg by mouth daily.     [provider]  aspirin  EC 81 MG  tablet Take 1 tablet (81 mg total) by mouth daily. Swallow whole. 06/03/23 06/02/24  Marea Selinda RAMAN, MD  atorvastatin  (LIPITOR) 10 MG tablet TAKE 1 TABLET EVERY DAY 02/19/24   Dew, Jason S, MD  Azelastine  HCl 137 MCG/SPRAY SOLN Place 2 sprays into both nostrils 2 (two) times daily. 01/20/24   Kasa, Kurian, MD  budesonide -glycopyrrolate -formoterol  (BREZTRI  AEROSPHERE) 160-9-4.8 MCG/ACT AERO inhaler Inhale 2 puffs into the lungs in the morning and at bedtime. 02/10/24   Kasa, Kurian, MD  budesonide -glycopyrrolate -formoterol  (BREZTRI  AEROSPHERE) 160-9-4.8 MCG/ACT AERO inhaler Inhale 2 puffs into the lungs 2 (two) times daily. 02/24/24   [name], Amy E, MD  Cholecalciferol  (VITAMIN D ) 125 MCG (5000 UT) CAPS Take 5,000 Units by mouth daily.    [provider]  clopidogrel  (PLAVIX ) 75 MG tablet TAKE 1 TABLET EVERY DAY 05/01/23   Dew, Jason S, MD  Coenzyme Q10 (COQ10) 200 MG CAPS Take by mouth.    [provider]  docusate sodium  (COLACE) 100 MG capsule Take 100 mg by mouth 2 (two) times daily.  [provider]  Ensifentrine  (OHTUVAYRE ) 3 MG/2.5ML SUSP Inhale 1 Act into the lungs 2 (two) times daily. 12/02/23   Kasa, Kurian, MD  guaiFENesin  (MUCINEX ) 600 MG 12 hr tablet Take 600 mg by mouth 2 (two) times daily.    [provider]  losartan  (COZAAR ) 25 MG tablet 25 mg daily.  08/25/19   [provider]  Multiple Vitamins-Minerals (PRESERVISION AREDS) TABS Take 1 tablet by mouth 2 (two) times daily.    [provider]  OXYGEN  Inhale 4 L into the lungs daily in the afternoon.    [provider]  Jim DEAR Sprint Nebulizer Set MISC  12/13/23   [provider]  verapamil  (VERELAN  PM) 360 MG 24 hr capsule Take 360 mg by mouth daily. 10/20/19   [provider]  Vitamin E 180 MG CAPS Take 180 mg by mouth daily.    [provider]    Physical Exam: Vitals:   03/14/24 1051 03/14/24 1200 03/14/24 1215 03/14/24 1444  BP:  134/63    Pulse:   88 88   Resp:  18 20   Temp: 97.7 F (36.5 C)   97.7 F (36.5 C)  TempSrc: Oral   Oral  SpO2:  97% 97%   Weight:      Height:        Constitutional: NAD, calm, comfortable Vitals:   03/14/24 1051 03/14/24 1200 03/14/24 1215 03/14/24 1444  BP:  134/63    Pulse:  88 88   Resp:  18 20   Temp: 97.7 F (36.5 C)   97.7 F (36.5 C)  TempSrc: Oral   Oral  SpO2:  97% 97%   Weight:      Height:       Eyes: PERRL, lids and conjunctivae normal ENMT: Mucous membranes are moist. Posterior pharynx clear of any exudate or lesions.Normal dentition.  Neck: normal, supple, no masses, no thyromegaly Respiratory: clear to auscultation bilaterally, no wheezing, no crackles. Normal respiratory effort. No accessory muscle use.  Cardiovascular: Regular rate and rhythm, no murmurs / rubs / gallops. No extremity edema, +pulse on right , none palpable on left Abdomen: no tenderness, no masses palpated. No hepatosplenomegaly. Bowel sounds positive.  Musculoskeletal: no clubbing / cyanosis. No joint deformity upper and lower extremities. Good ROM, no contractures. Normal muscle tone.  Skin: multiple area of skin tears ,chronic lower extremity discoloration due to pvd  Neurologic: CN grossly intact. Sensation intact, Strength 5/5 in all 4.  Psychiatric: Normal judgment and insight. Alert and oriented x 3. Normal mood.    Labs on Admission: I have personally reviewed following labs and imaging studies  CBC: Recent Labs  Lab 03/14/24 0709  WBC 11.7*  HGB 12.9  HCT 40.6  MCV 97.1  PLT 390   Basic Metabolic Panel: Recent Labs  Lab 03/14/24 0709  NA 140  K 4.7  CL 104  CO2 25  GLUCOSE 176*  BUN 21  CREATININE 0.64  CALCIUM  9.2   GFR: Estimated Creatinine Clearance: 31.6 mL/min (by C-G formula based on SCr of 0.64 mg/dL). Liver Function Tests: Recent Labs  Lab 03/14/24 0709  AST 20  ALT 19  ALKPHOS 89  BILITOT 0.4  PROT 5.9*  ALBUMIN 3.5   No results for input(s): LIPASE,  AMYLASE in the last 168 hours. No results for input(s): AMMONIA in the last 168 hours. Coagulation Profile: Recent Labs  Lab 03/14/24 0709  INR 1.0   Cardiac Enzymes: No results for input(s): CKTOTAL, CKMB,  CKMBINDEX, TROPONINI in the last 168 hours. BNP (last 3 results) No results for input(s): PROBNP in the last 8760 hours. HbA1C: No results for input(s): HGBA1C in the last 72 hours. CBG: No results for input(s): GLUCAP in the last 168 hours. Lipid Profile: No results for input(s): CHOL, HDL, LDLCALC, TRIG, CHOLHDL, LDLDIRECT in the last 72 hours. Thyroid  Function Tests: No results for input(s): TSH, T4TOTAL, FREET4, T3FREE, THYROIDAB in the last 72 hours. Anemia Panel: No results for input(s): VITAMINB12, FOLATE, FERRITIN, TIBC, IRON, RETICCTPCT in the last 72 hours. Urine analysis:    Component Value Date/Time   COLORURINE YELLOW 08/18/2010 1626   APPEARANCEUR Cloudy (A) 09/04/2023 1341   LABSPEC 1.021 08/18/2010 1626   PHURINE 5.0 08/18/2010 1626   GLUCOSEU Negative 09/04/2023 1341   HGBUR NEGATIVE 08/18/2010 1626   BILIRUBINUR Negative 09/04/2023 1341   KETONESUR NEGATIVE 08/18/2010 1626   PROTEINUR 3+ (A) 09/04/2023 1341   PROTEINUR NEGATIVE 08/18/2010 1626   UROBILINOGEN 0.2 04/24/2019 1037   UROBILINOGEN 0.2 08/18/2010 1626   NITRITE Negative 09/04/2023 1341   NITRITE POSITIVE (A) 08/18/2010 1626   LEUKOCYTESUR Negative 09/04/2023 1341    Radiological Exams on Admission: CT Maxillofacial Wo Contrast Result Date: 03/14/2024 CLINICAL DATA:  89 year old female status post fall, respiratory distress. Skin tears. EXAM: CT MAXILLOFACIAL WITHOUT CONTRAST TECHNIQUE: Multidetector CT imaging of the maxillofacial structures was performed. Multiplanar CT image reconstructions were also generated. RADIATION DOSE REDUCTION: This exam was performed according to the departmental dose-optimization program which includes automated  exposure control, adjustment of the mA and/or kV according to patient size and/or use of iterative reconstruction technique. COMPARISON:  CT head and cervical spine today.  Face CT 10/22/2017. FINDINGS: Osseous: Mandible intact and normally located. Absent maxillary dentition. Bilateral maxilla, zygoma, pterygoid, nasal bones appear stable and intact. Central skull base appears intact. Orbits: Intact orbital walls. Chronic postoperative changes to the globes. Orbits soft tissues otherwise normal. Sinuses: Advanced chronic left sphenoid sinusitis with stable mucoperiosteal thickening. Other paranasal sinuses, tympanic cavities are clear. Chronic bilateral mastoid effusions and sclerosis, stable. Soft tissues: Mild motion artifact in the deep soft tissue spaces of the face. Negative visible noncontrast thyroid , larynx, pharynx, parapharyngeal spaces, retropharyngeal space, sublingual space, submandibular, masticator and parotid spaces. Calcified cervical carotid atherosclerosis. Limited intracranial: Stable to that reported separately. IMPRESSION: 1. No acute traumatic injury identified in the Face. 2. Stable chronic left sphenoid sinusitis, chronic bilateral mastoid inflammation. Electronically Signed   By: VEAR Hurst M.D.   On: 03/14/2024 09:18   CT Cervical Spine Wo Contrast Result Date: 03/14/2024 CLINICAL DATA:  89 year old female status post fall, respiratory distress. Skin tears. EXAM: CT CERVICAL SPINE WITHOUT CONTRAST TECHNIQUE: Multidetector CT imaging of the cervical spine was performed without intravenous contrast. Multiplanar CT image reconstructions were also generated. RADIATION DOSE REDUCTION: This exam was performed according to the departmental dose-optimization program which includes automated exposure control, adjustment of the mA and/or kV according to patient size and/or use of iterative reconstruction technique. COMPARISON:  Head and face CT today. FINDINGS: Alignment: Straightening of cervical  lordosis. Cervicothoracic junction alignment is within normal limits. Bilateral posterior element alignment is within normal limits. Skull base and vertebrae: Bone mineralization is within normal limits for age. Visualized skull base is intact. No atlanto-occipital dissociation. C1 and C2 appear intact and aligned. No acute osseous abnormality identified. Soft tissues and spinal canal: No prevertebral fluid or swelling. No visible canal hematoma. Motion artifact in the noncontrast neck. Calcified carotid atherosclerosis. Disc levels: Degenerative cervical  vertebral ankylosis C3-C4. Advanced facet arthropathy, chronic disc and endplate degeneration at the adjacent segments. But no significant spinal stenosis by CT. Upper chest: Moderate to large layering left pleural effusion in the left lung apex, simple fluid density favoring transudate. Underlying emphysema, moderate to severe. Negative visible noncontrast thoracic inlet soft tissues. IMPRESSION: 1. No acute traumatic injury identified in the cervical spine. 2. Cervical spine degeneration superimposed on degenerative C3-C4 ankylosis, but no significant spinal stenosis by CT. 3. Emphysema (ICD10-J43.9). Moderate or possibly large layering Left Pleural Effusion suspected to be transudate. Electronically Signed   By: VEAR Hurst M.D.   On: 03/14/2024 09:16   CT Head Wo Contrast Result Date: 03/14/2024 CLINICAL DATA:  89 year old female status post fall, respiratory distress. Skin tears. EXAM: CT HEAD WITHOUT CONTRAST TECHNIQUE: Contiguous axial images were obtained from the base of the skull through the vertex without intravenous contrast. RADIATION DOSE REDUCTION: This exam was performed according to the departmental dose-optimization program which includes automated exposure control, adjustment of the mA and/or kV according to patient size and/or use of iterative reconstruction technique. COMPARISON:  Face and cervical spine CT today reported separately. Head and  face CT 10/22/2017. FINDINGS: Brain: Cerebral volume not significantly changed since 2019. No midline shift, ventriculomegaly, mass effect, evidence of mass lesion, intracranial hemorrhage or evidence of cortically based acute infarction. Gray-white differentiation remains normal for age. Vascular: No suspicious intracranial vascular hyperdensity. Calcified atherosclerosis at the skull base. Stable punctate basal ganglia vascular calcifications. Skull: Stable. No acute osseous abnormality identified. Face reported separately. Sinuses/Orbits: Chronic left sphenoid sinusitis with mucoperiosteal thickening appears stable. Other paranasal sinuses well aerated. Mild chronic mastoid effusions and sclerosis stable since 2019. Tympanic cavities well aerated. Other: No orbit or scalp soft tissue injury identified. IMPRESSION: 1. No  acute traumatic injury identified. 2. Stable and negative for age noncontrast CT appearance of the brain. 3. Face CT reported separately. Electronically Signed   By: VEAR Hurst M.D.   On: 03/14/2024 09:13   DG Elbow 2 Views Right Result Date: 03/14/2024 EXAM: 1 or 2 VIEW(S) XRAY OF THE RIGHT ELBOW COMPARISON: 02/01/2023 CLINICAL HISTORY: Fall. EMS reports to have found patient sitting on the toilet with skin tears present to her right elbow and knee. Upon arrival, patient was in respiratory distress with bilateral expiratory wheezing and O2 sats at 85% on RA. History of COPD. FINDINGS: BONES AND JOINTS: No acute fracture. No focal osseous lesion. No joint dislocation. SOFT TISSUES: The soft tissues are unremarkable. IMPRESSION: 1. No acute abnormality. Electronically signed by: Waddell Calk MD 03/14/2024 08:47 AM EDT RP Workstation: [id number]   DG Knee 2 Views Right Result Date: 03/14/2024 EXAM: 1 or 2 VIEW(S) XRAY OF THE RIGHT KNEE 03/14/2024 08:33:00 AM COMPARISON: 02/01/2023 CLINICAL HISTORY: Fall. EMS reports to have found patient sitting on the toilet with skin tears present to her right  elbow and knee. Upon arrival, patient was in respiratory distress with bilateral expiratory wheezing and O2 sats at 85% on RA. History of COPD. FINDINGS: BONES AND JOINTS: No acute fracture. No focal osseous lesion. No joint dislocation. No significant joint effusion. No significant degenerative changes. SOFT TISSUES: New prepatellar soft tissue thickening measures 1.2 cm in thickness. Soft tissue irregularity with mild subcutaneous superficial soft tissue gas overlying the lateral knee compatible with soft tissue injury. IMPRESSION: 1. No acute fracture or dislocation. 2. Soft tissue irregularity with mild subcutaneous superficial soft tissue gas overlying the lateral knee, compatible with soft tissue injury. 3. New prepatellar soft  tissue thickening measuring 1.2 cm in thickness . Electronically signed by: Waddell Calk MD 03/14/2024 08:46 AM EDT RP Workstation: [id number]   DG Hip Unilat With Pelvis 2-3 Views Left Result Date: 03/14/2024 EXAM: 2 or 3 VIEW(S) XRAY OF THE LEFT HIP 03/14/2024 08:33:00 AM COMPARISON: None available. CLINICAL HISTORY: Fall. EMS reports to have found patient sitting on the toilet with skin tears present to her right elbow and knee. Upon arrival, patient was in respiratory distress with bilateral expiratory wheezing and O2 sats at 85% on RA. History of COPD. FINDINGS: BONES AND JOINTS: No acute fracture or focal osseous lesion. The hip joint is maintained. No significant degenerative changes. SOFT TISSUES: The soft tissues are unremarkable. VASCULATURE: Vascular calcifications identified. Right common iliac artery stent. IMPRESSION: 1. No acute fracture or dislocation of the left hip or visualized pelvis. Electronically signed by: Waddell Calk MD 03/14/2024 08:44 AM EDT RP Workstation: [id number]   DG Chest Port 1 View Result Date: 03/14/2024 EXAM: 1 VIEW XRAY OF THE CHEST 03/14/2024 07:12:17 AM COMPARISON: 07/25/2022 CLINICAL HISTORY: SOB, respiratory distress. EMS reports they  were called for a fall. Upon arrival they report to have found patient sitting on the toilet with skin tears present to her right elbow and knee. Upon arrival patient was in respiratory distress with bilateral expiratory wheezing and O2 sats at 85% on RA. History of COPD. FINDINGS: LUNGS AND PLEURA: New small to moderate size left pleural effusion with overlying atelectasis and retrocardiac opacification. New patchy opacities noted within the central right upper lobe. Emphysema with diffuse bronchial wall thickening. HEART AND MEDIASTINUM: No acute abnormality of the cardiac and mediastinal silhouettes. Aortic atherosclerotic calcification. BONES AND SOFT TISSUES: Remote left lower anterior rib fractures. IMPRESSION: 1. New small to moderate size left pleural effusion with overlying atelectasis and retrocardiac opacification. 2. New small patchy opacities within the central right upper lobe. 3. Emphysema with diffuse bronchial wall thickening. 4. Aortic atherosclerotic calcification. 5. Remote left lower anterior rib fractures. Electronically signed by: Waddell Calk MD 03/14/2024 07:31 AM EDT RP Workstation: [id number]    EKG: Independently reviewed.   Assessment/Plan   CAP with acute hypoxic respiratory failure  -admit to progressive care  -cefepime / azithromycin , in setting of bronchiectasis de-escalate as able  -pulmonary toilet  -urine ag, sputum, f/u on culture data   Acute COPD exacerbation with acute hypoxic respiratory failure  -in background of bronchiectasis  - solumedrol iv , bid taper to po prednisone  daily per protocol - cefepime / azithromycin  due to hx of bronchiectasis  -f/u on sputum cultures  - nebs standing and prn  -resume chronic inhalers  -pulmonary toilet  -wean O2 as able back to baseline needs  Acute on chronic critical limb ischemia of left lower extremity  - continue plavix /asa  - on heparin  drip per Dr MAREA -plans for intervention Monday if patient is medically  cleared   Pleural nodule -followed by pulmonary Dr Isaiah   HTN -stable  -resume home regimen once med rec completed   HLD   Prediabetes -monitor poc   Hx ladder CA s/p TURBT 05/22/2019 s/p chemo and xrt -monitor of for hematuria , due to patient history   Tobacco abuse -in remission   DVT prophylaxis: heparin  drip Code Status: full/ as discussed per patient wishes in event of cardiac arrest  Family Communication:  Disposition Plan: patient  expected to be admitted greater than 2 midnights  Consults called:  Dr MAREA vascular Admission status:  progressive   Camila DELENA Ned MD  [facility]  If 7PM-7AM, please contact night-coverage [email or web address] Password [facility]  03/14/2024, 3:46 PM

## 2024-03-14 NOTE — ED Triage Notes (Signed)
 Pt arrives from home via [facility] in respiratory distress. EMS reports they were called for a fall. Upon arrival they report to have found pt sitting on the toilet with skin tears present to her right elbow and knee. Upon arrival pt was in respiratory distress with bilateral expiratory wheezing and O2 sats at 85% on RA. Hx of COPD. Received 2 Albuterol  breathing tx en route with EMS. Reported to have received 1 Albuterol  treatment from the fire department. Unknown if pt hit her head at this time but EMS reports that pt denied pain in head, neck, or back. Takes Plavix . Pt alert at this time. RT at bedside.

## 2024-03-14 NOTE — ED Notes (Signed)
 Attending notified of lactic

## 2024-03-14 NOTE — Progress Notes (Signed)
 CODE SEPSIS - PHARMACY COMMUNICATION  **Broad Spectrum Antibiotics should be administered within 1 hour of Sepsis diagnosis**  Time Code Sepsis Called/Page Received: 1023  Antibiotics Ordered: Azithromycin , Cefepime    Time of 1st antibiotic administration: 1050  Additional action taken by pharmacy: N/A  If necessary, Name of Provider/Nurse Contacted: N/A   Misty Ferguson, PharmD, BCPS Clinical Pharmacist 03/14/2024 11:03 AM

## 2024-03-14 NOTE — H&P (Incomplete)
 History and Physical    Misty Ferguson FMW:[id number] DOB: 1934/02/06 DOA: 03/14/2024  PCP: Avelina Greig BRAVO, MD  Patient coming from: ***  I have personally briefly reviewed patient's old medical records in [facility]  Chief Complaint: ***  HPI: Misty Ferguson is a 89 y.o. female with medical history significant of    ED Course: ***  Review of Systems: As per HPI otherwise 10 point review of systems negative.   Past Medical History:  Diagnosis Date   AK (actinic keratosis) 12/08/2020   right pretibia inferior bx proven, LN2 01/10/21   Burping    Chronic airway obstruction, not elsewhere classified    Dyspnea    Dysrhythmia    Macular degeneration (senile) of retina, unspecified    Malignant neoplasm of urinary bladder (HCC) 03/2019   Partial bladder resection and Rad tx's.    Obstructive chronic bronchitis with exacerbation (HCC)    Osteoporosis, unspecified    Oxygen  deficiency    Personal history of peptic ulcer disease    SCC (squamous cell carcinoma) 12/08/2020   right pretibia superior, EDC 01/10/21   SCC (squamous cell carcinoma) 07/03/2022   left medial calf, clear 09/25/22   SCC (squamous cell carcinoma) 11/22/2022   right thigh   treated with ED&C   Squamous cell carcinoma in situ (SCCIS) 07/03/2022   right pretibia sccis needs treatment with fluorouracil    Tobacco use disorder    Unspecified essential hypertension    Unspecified glaucoma(365.9)     Past Surgical History:  Procedure Laterality Date   CATARACT EXTRACTION W/ INTRAOCULAR LENS  IMPLANT, BILATERAL     CYSTOSCOPY WITH STENT PLACEMENT Left 05/22/2019   Procedure: CYSTOSCOPY WITH STENT PLACEMENT;  Surgeon: Francisca Redell BROCKS, MD;  Location: [facility] ORS;  Service: Urology;  Laterality: Left;   ELBOW FRACTURE SURGERY Left    LOWER EXTREMITY ANGIOGRAPHY Right 11/20/2021   Procedure: Lower Extremity Angiography;  Surgeon: Marea Selinda RAMAN, MD;  Location: [facility] INVASIVE CV LAB;  Service: Cardiovascular;   Laterality: Right;   LOWER EXTREMITY ANGIOGRAPHY Left 09/03/2022   Procedure: Lower Extremity Angiography;  Surgeon: Marea Selinda RAMAN, MD;  Location: [facility] INV[facility] CV [facility];  Service: Cardiovascular;  Laterality: Left;   LOWER EXTREMITY ANGIOGRAPHY Left 06/03/2023   Procedure: Lower Extremity Angiography;  Surgeon: Marea Selinda RAMAN, MD;  Location: [facility] INV[facility]IVE CV LAB;  Service: Cardiovascular;  Laterality: Left;   TRANSURETHRAL RESECTION OF BLADDER TUMOR N/A 05/22/2019   Procedure: TRANSURETHRAL RESECTION OF BLADDER TUMOR (TURBT);  Surgeon: Francisca Redell BROCKS, MD;  Location: [facility] ORS;  Service: Urology;  Laterality: N/A;     reports that she quit smoking about 27 years ago. Her smoking use included cigarettes. She started smoking about 72 years ago. She has a 58.5 pack-year smoking history. She has been exposed to tobacco smoke. She has never used smokeless tobacco. She reports current alcohol use of about 14.0 standard drinks of alcohol per week. She reports that she does not use drugs.  No Known Allergies  Family History  Adopted: Yes   *** Prior to Admission medications   Medication Sig Start Date End Date Taking? Authorizing Provider  Acetylcysteine (NAC 600 PO) Take by mouth.    [provider]  albuterol  (PROVENTIL ) (2.5 MG/3ML) 0.083% nebulizer solution INHALE THE CONTENTS OF 1 VIAL VIA NEBULIZER EVERY 4 HOURS AS NEEDED FOR WHEEZING OR SHORTNESS OF BREATH. 02/07/24   Isaiah Scrivener, MD  albuterol  (VENTOLIN  HFA) 108 (90 Base) MCG/ACT inhaler Inhale 2 puffs into the  lungs every 6 (six) hours as needed for wheezing or shortness of breath. 12/02/23   Kasa, Kurian, MD  ascorbic acid (VITAMIN C) 1000 MG tablet Take 1,000 mg by mouth daily.     [provider]  aspirin  EC 81 MG tablet Take 1 tablet (81 mg total) by mouth daily. Swallow whole. 06/03/23 06/02/24  Marea Selinda RAMAN, MD  atorvastatin  (LIPITOR) 10 MG tablet TAKE 1 TABLET EVERY DAY 02/19/24   Dew, Jason S, MD  Azelastine  HCl 137  MCG/SPRAY SOLN Place 2 sprays into both nostrils 2 (two) times daily. 01/20/24   Kasa, Kurian, MD  budesonide -glycopyrrolate -formoterol  (BREZTRI  AEROSPHERE) 160-9-4.8 MCG/ACT AERO inhaler Inhale 2 puffs into the lungs in the morning and at bedtime. 02/10/24   Kasa, Kurian, MD  budesonide -glycopyrrolate -formoterol  (BREZTRI  AEROSPHERE) 160-9-4.8 MCG/ACT AERO inhaler Inhale 2 puffs into the lungs 2 (two) times daily. 02/24/24   [name], Amy E, MD  Cholecalciferol  (VITAMIN D ) 125 MCG (5000 UT) CAPS Take 5,000 Units by mouth daily.    [provider]  clopidogrel  (PLAVIX ) 75 MG tablet TAKE 1 TABLET EVERY DAY 05/01/23   Dew, Jason S, MD  Coenzyme Q10 (COQ10) 200 MG CAPS Take by mouth.    [provider]  docusate sodium  (COLACE) 100 MG capsule Take 100 mg by mouth 2 (two) times daily.    [provider]  Ensifentrine  (OHTUVAYRE ) 3 MG/2.5ML SUSP Inhale 1 Act into the lungs 2 (two) times daily. 12/02/23   Kasa, Kurian, MD  guaiFENesin  (MUCINEX ) 600 MG 12 hr tablet Take 600 mg by mouth 2 (two) times daily.    [provider]  losartan  (COZAAR ) 25 MG tablet 25 mg daily.  08/25/19   [provider]  Multiple Vitamins-Minerals (PRESERVISION AREDS) TABS Take 1 tablet by mouth 2 (two) times daily.    [provider]  OXYGEN  Inhale 4 L into the lungs daily in the afternoon.    [provider]  Jim DEAR Sprint Nebulizer Set MISC  12/13/23   [provider]  verapamil  (VERELAN  PM) 360 MG 24 hr capsule Take 360 mg by mouth daily. 10/20/19   [provider]  Vitamin E 180 MG CAPS Take 180 mg by mouth daily.    [provider]    Physical Exam: Vitals:   03/14/24 0900 03/14/24 0915 03/14/24 1000 03/14/24 1051  BP: (!) 143/65  (!) 158/68   Pulse: 88 92 89   Resp: 20 20 (!) 24   Temp:    97.7 F (36.5 C)  TempSrc:    Oral  SpO2: 95% 96% 99%   Weight:      Height:        Constitutional: NAD, calm, comfortable Vitals:   03/14/24  0900 03/14/24 0915 03/14/24 1000 03/14/24 1051  BP: (!) 143/65  (!) 158/68   Pulse: 88 92 89   Resp: 20 20 (!) 24   Temp:    97.7 F (36.5 C)  TempSrc:    Oral  SpO2: 95% 96% 99%   Weight:      Height:       Eyes: PERRL, lids and conjunctivae normal ENMT: Mucous membranes are moist. Posterior pharynx clear of any exudate or lesions.Normal dentition.  Neck: normal, supple, no masses, no thyromegaly Respiratory: clear to auscultation bilaterally, no wheezing, no crackles. Normal respiratory effort. No accessory muscle use.  Cardiovascular: Regular rate and rhythm, no murmurs / rubs / gallops. No extremity edema. 2+ pedal pulses. No carotid bruits.  Abdomen: no tenderness,  no masses palpated. No hepatosplenomegaly. Bowel sounds positive.  Musculoskeletal: no clubbing / cyanosis. No joint deformity upper and lower extremities. Good ROM, no contractures. Normal muscle tone.  Skin: no rashes, lesions, ulcers. No induration Neurologic: CN 2-12 grossly intact. Sensation intact, DTR normal. Strength 5/5 in all 4.  Psychiatric: Normal judgment and insight. Alert and oriented x 3. Normal mood.    Labs on Admission: I have personally reviewed following labs and imaging studies  CBC: Recent Labs  Lab 03/14/24 0709  WBC 11.7*  HGB 12.9  HCT 40.6  MCV 97.1  PLT 390   Basic Metabolic Panel: Recent Labs  Lab 03/14/24 0709  NA 140  K 4.7  CL 104  CO2 25  GLUCOSE 176*  BUN 21  CREATININE 0.64  CALCIUM  9.2   GFR: Estimated Creatinine Clearance: 31.6 mL/min (by C-G formula based on SCr of 0.64 mg/dL). Liver Function Tests: Recent Labs  Lab 03/14/24 0709  AST 20  ALT 19  ALKPHOS 89  BILITOT 0.4  PROT 5.9*  ALBUMIN 3.5   No results for input(s): LIPASE, AMYLASE in the last 168 hours. No results for input(s): AMMONIA in the last 168 hours. Coagulation Profile: Recent Labs  Lab 03/14/24 0709  INR 1.0   Cardiac Enzymes: No results for input(s): CKTOTAL, CKMB,  CKMBINDEX, TROPONINI in the last 168 hours. BNP (last 3 results) No results for input(s): PROBNP in the last 8760 hours. HbA1C: No results for input(s): HGBA1C in the last 72 hours. CBG: No results for input(s): GLUCAP in the last 168 hours. Lipid Profile: No results for input(s): CHOL, HDL, LDLCALC, TRIG, CHOLHDL, LDLDIRECT in the last 72 hours. Thyroid  Function Tests: No results for input(s): TSH, T4TOTAL, FREET4, T3FREE, THYROIDAB in the last 72 hours. Anemia Panel: No results for input(s): VITAMINB12, FOLATE, FERRITIN, TIBC, IRON, RETICCTPCT in the last 72 hours. Urine analysis:    Component Value Date/Time   COLORURINE YELLOW 08/18/2010 1626   APPEARANCEUR Cloudy (A) 09/04/2023 1341   LABSPEC 1.021 08/18/2010 1626   PHURINE 5.0 08/18/2010 1626   GLUCOSEU Negative 09/04/2023 1341   HGBUR NEGATIVE 08/18/2010 1626   BILIRUBINUR Negative 09/04/2023 1341   KETONESUR NEGATIVE 08/18/2010 1626   PROTEINUR 3+ (A) 09/04/2023 1341   PROTEINUR NEGATIVE 08/18/2010 1626   UROBILINOGEN 0.2 04/24/2019 1037   UROBILINOGEN 0.2 08/18/2010 1626   NITRITE Negative 09/04/2023 1341   NITRITE POSITIVE (A) 08/18/2010 1626   LEUKOCYTESUR Negative 09/04/2023 1341    Radiological Exams on Admission: CT Maxillofacial Wo Contrast Result Date: 03/14/2024 CLINICAL DATA:  89 year old female status post fall, respiratory distress. Skin tears. EXAM: CT MAXILLOFACIAL WITHOUT CONTRAST TECHNIQUE: Multidetector CT imaging of the maxillofacial structures was performed. Multiplanar CT image reconstructions were also generated. RADIATION DOSE REDUCTION: This exam was performed according to the departmental dose-optimization program which includes automated exposure control, adjustment of the mA and/or kV according to patient size and/or use of iterative reconstruction technique. COMPARISON:  CT head and cervical spine today.  Face CT 10/22/2017. FINDINGS: Osseous: Mandible  intact and normally located. Absent maxillary dentition. Bilateral maxilla, zygoma, pterygoid, nasal bones appear stable and intact. Central skull base appears intact. Orbits: Intact orbital walls. Chronic postoperative changes to the globes. Orbits soft tissues otherwise normal. Sinuses: Advanced chronic left sphenoid sinusitis with stable mucoperiosteal thickening. Other paranasal sinuses, tympanic cavities are clear. Chronic bilateral mastoid effusions and sclerosis, stable. Soft tissues: Mild motion artifact in the deep soft tissue spaces of the face. Negative visible noncontrast thyroid , larynx, pharynx, parapharyngeal  spaces, retropharyngeal space, sublingual space, submandibular, masticator and parotid spaces. Calcified cervical carotid atherosclerosis. Limited intracranial: Stable to that reported separately. IMPRESSION: 1. No acute traumatic injury identified in the Face. 2. Stable chronic left sphenoid sinusitis, chronic bilateral mastoid inflammation. Electronically Signed   By: VEAR Hurst M.D.   On: 03/14/2024 09:18   CT Cervical Spine Wo Contrast Result Date: 03/14/2024 CLINICAL DATA:  89 year old female status post fall, respiratory distress. Skin tears. EXAM: CT CERVICAL SPINE WITHOUT CONTRAST TECHNIQUE: Multidetector CT imaging of the cervical spine was performed without intravenous contrast. Multiplanar CT image reconstructions were also generated. RADIATION DOSE REDUCTION: This exam was performed according to the departmental dose-optimization program which includes automated exposure control, adjustment of the mA and/or kV according to patient size and/or use of iterative reconstruction technique. COMPARISON:  Head and face CT today. FINDINGS: Alignment: Straightening of cervical lordosis. Cervicothoracic junction alignment is within normal limits. Bilateral posterior element alignment is within normal limits. Skull base and vertebrae: Bone mineralization is within normal limits for age. Visualized  skull base is intact. No atlanto-occipital dissociation. C1 and C2 appear intact and aligned. No acute osseous abnormality identified. Soft tissues and spinal canal: No prevertebral fluid or swelling. No visible canal hematoma. Motion artifact in the noncontrast neck. Calcified carotid atherosclerosis. Disc levels: Degenerative cervical vertebral ankylosis C3-C4. Advanced facet arthropathy, chronic disc and endplate degeneration at the adjacent segments. But no significant spinal stenosis by CT. Upper chest: Moderate to large layering left pleural effusion in the left lung apex, simple fluid density favoring transudate. Underlying emphysema, moderate to severe. Negative visible noncontrast thoracic inlet soft tissues. IMPRESSION: 1. No acute traumatic injury identified in the cervical spine. 2. Cervical spine degeneration superimposed on degenerative C3-C4 ankylosis, but no significant spinal stenosis by CT. 3. Emphysema (ICD10-J43.9). Moderate or possibly large layering Left Pleural Effusion suspected to be transudate. Electronically Signed   By: VEAR Hurst M.D.   On: 03/14/2024 09:16   CT Head Wo Contrast Result Date: 03/14/2024 CLINICAL DATA:  89 year old female status post fall, respiratory distress. Skin tears. EXAM: CT HEAD WITHOUT CONTRAST TECHNIQUE: Contiguous axial images were obtained from the base of the skull through the vertex without intravenous contrast. RADIATION DOSE REDUCTION: This exam was performed according to the departmental dose-optimization program which includes automated exposure control, adjustment of the mA and/or kV according to patient size and/or use of iterative reconstruction technique. COMPARISON:  Face and cervical spine CT today reported separately. Head and face CT 10/22/2017. FINDINGS: Brain: Cerebral volume not significantly changed since 2019. No midline shift, ventriculomegaly, mass effect, evidence of mass lesion, intracranial hemorrhage or evidence of cortically based acute  infarction. Gray-white differentiation remains normal for age. Vascular: No suspicious intracranial vascular hyperdensity. Calcified atherosclerosis at the skull base. Stable punctate basal ganglia vascular calcifications. Skull: Stable. No acute osseous abnormality identified. Face reported separately. Sinuses/Orbits: Chronic left sphenoid sinusitis with mucoperiosteal thickening appears stable. Other paranasal sinuses well aerated. Mild chronic mastoid effusions and sclerosis stable since 2019. Tympanic cavities well aerated. Other: No orbit or scalp soft tissue injury identified. IMPRESSION: 1. No  acute traumatic injury identified. 2. Stable and negative for age noncontrast CT appearance of the brain. 3. Face CT reported separately. Electronically Signed   By: VEAR Hurst M.D.   On: 03/14/2024 09:13   DG Elbow 2 Views Right Result Date: 03/14/2024 EXAM: 1 or 2 VIEW(S) XRAY OF THE RIGHT ELBOW COMPARISON: 02/01/2023 CLINICAL HISTORY: Fall. EMS reports to have found patient sitting on the  toilet with skin tears present to her right elbow and knee. Upon arrival, patient was in respiratory distress with bilateral expiratory wheezing and O2 sats at 85% on RA. History of COPD. FINDINGS: BONES AND JOINTS: No acute fracture. No focal osseous lesion. No joint dislocation. SOFT TISSUES: The soft tissues are unremarkable. IMPRESSION: 1. No acute abnormality. Electronically signed by: Waddell Calk MD 03/14/2024 08:47 AM EDT RP Workstation: [id number]   DG Knee 2 Views Right Result Date: 03/14/2024 EXAM: 1 or 2 VIEW(S) XRAY OF THE RIGHT KNEE 03/14/2024 08:33:00 AM COMPARISON: 02/01/2023 CLINICAL HISTORY: Fall. EMS reports to have found patient sitting on the toilet with skin tears present to her right elbow and knee. Upon arrival, patient was in respiratory distress with bilateral expiratory wheezing and O2 sats at 85% on RA. History of COPD. FINDINGS: BONES AND JOINTS: No acute fracture. No focal osseous lesion. No joint  dislocation. No significant joint effusion. No significant degenerative changes. SOFT TISSUES: New prepatellar soft tissue thickening measures 1.2 cm in thickness. Soft tissue irregularity with mild subcutaneous superficial soft tissue gas overlying the lateral knee compatible with soft tissue injury. IMPRESSION: 1. No acute fracture or dislocation. 2. Soft tissue irregularity with mild subcutaneous superficial soft tissue gas overlying the lateral knee, compatible with soft tissue injury. 3. New prepatellar soft tissue thickening measuring 1.2 cm in thickness . Electronically signed by: Waddell Calk MD 03/14/2024 08:46 AM EDT RP Workstation: [id number]   DG Hip Unilat With Pelvis 2-3 Views Left Result Date: 03/14/2024 EXAM: 2 or 3 VIEW(S) XRAY OF THE LEFT HIP 03/14/2024 08:33:00 AM COMPARISON: None available. CLINICAL HISTORY: Fall. EMS reports to have found patient sitting on the toilet with skin tears present to her right elbow and knee. Upon arrival, patient was in respiratory distress with bilateral expiratory wheezing and O2 sats at 85% on RA. History of COPD. FINDINGS: BONES AND JOINTS: No acute fracture or focal osseous lesion. The hip joint is maintained. No significant degenerative changes. SOFT TISSUES: The soft tissues are unremarkable. VASCULATURE: Vascular calcifications identified. Right common iliac artery stent. IMPRESSION: 1. No acute fracture or dislocation of the left hip or visualized pelvis. Electronically signed by: Waddell Calk MD 03/14/2024 08:44 AM EDT RP Workstation: [id number]   DG Chest Port 1 View Result Date: 03/14/2024 EXAM: 1 VIEW XRAY OF THE CHEST 03/14/2024 07:12:17 AM COMPARISON: 07/25/2022 CLINICAL HISTORY: SOB, respiratory distress. EMS reports they were called for a fall. Upon arrival they report to have found patient sitting on the toilet with skin tears present to her right elbow and knee. Upon arrival patient was in respiratory distress with bilateral expiratory  wheezing and O2 sats at 85% on RA. History of COPD. FINDINGS: LUNGS AND PLEURA: New small to moderate size left pleural effusion with overlying atelectasis and retrocardiac opacification. New patchy opacities noted within the central right upper lobe. Emphysema with diffuse bronchial wall thickening. HEART AND MEDIASTINUM: No acute abnormality of the cardiac and mediastinal silhouettes. Aortic atherosclerotic calcification. BONES AND SOFT TISSUES: Remote left lower anterior rib fractures. IMPRESSION: 1. New small to moderate size left pleural effusion with overlying atelectasis and retrocardiac opacification. 2. New small patchy opacities within the central right upper lobe. 3. Emphysema with diffuse bronchial wall thickening. 4. Aortic atherosclerotic calcification. 5. Remote left lower anterior rib fractures. Electronically signed by: Waddell Calk MD 03/14/2024 07:31 AM EDT RP Workstation: [id number]    EKG: Independently reviewed. ***  Assessment/Plan Active Problems:   CAP (community acquired pneumonia)   ***  DVT  prophylaxis: *** (Lovenox /Heparin /SCD's/anticoagulated/None (if comfort care) Code Status: *** (Full/Partial (specify details) Family Communication: *** (Specify name, relationship. Do not write discussed with patient. Specify tel # if discussed over the phone) Disposition Plan: *** (specify when and where you expect patient to be discharged) Consults called: *** (with names) Admission status: *** (inpatient / obs / tele / medical floor / SDU)   Misty DELENA Ned MD [facility] Pager [phone]  If 7PM-7AM, please contact night-coverage [email or web address] Password [facility]  03/14/2024, 11:27 AM

## 2024-03-15 ENCOUNTER — Inpatient Hospital Stay

## 2024-03-15 DIAGNOSIS — J189 Pneumonia, unspecified organism: Secondary | ICD-10-CM | POA: Diagnosis not present

## 2024-03-15 DIAGNOSIS — I70222 Atherosclerosis of native arteries of extremities with rest pain, left leg: Secondary | ICD-10-CM | POA: Diagnosis not present

## 2024-03-15 DIAGNOSIS — J9611 Chronic respiratory failure with hypoxia: Secondary | ICD-10-CM | POA: Diagnosis not present

## 2024-03-15 DIAGNOSIS — J90 Pleural effusion, not elsewhere classified: Secondary | ICD-10-CM

## 2024-03-15 DIAGNOSIS — J441 Chronic obstructive pulmonary disease with (acute) exacerbation: Secondary | ICD-10-CM | POA: Diagnosis not present

## 2024-03-15 DIAGNOSIS — M79605 Pain in left leg: Secondary | ICD-10-CM

## 2024-03-15 DIAGNOSIS — Z515 Encounter for palliative care: Secondary | ICD-10-CM | POA: Diagnosis not present

## 2024-03-15 DIAGNOSIS — I998 Other disorder of circulatory system: Secondary | ICD-10-CM

## 2024-03-15 DIAGNOSIS — Z4682 Encounter for fitting and adjustment of non-vascular catheter: Secondary | ICD-10-CM | POA: Diagnosis not present

## 2024-03-15 LAB — BASIC METABOLIC PANEL WITH GFR
Anion gap: 9 (ref 5–15)
BUN: 22 mg/dL (ref 8–23)
CO2: 26 mmol/L (ref 22–32)
Calcium: 8.8 mg/dL — ABNORMAL LOW (ref 8.9–10.3)
Chloride: 105 mmol/L (ref 98–111)
Creatinine, Ser: 0.62 mg/dL (ref 0.44–1.00)
GFR, Estimated: >60 mL/min (ref >60)
Glucose, Bld: 149 mg/dL — ABNORMAL HIGH (ref 70–99)
Potassium: 4.4 mmol/L (ref 3.5–5.1)
Sodium: 140 mmol/L (ref 135–145)

## 2024-03-15 LAB — RESPIRATORY PANEL BY PCR

## 2024-03-15 LAB — BLOOD GAS, VENOUS
Acid-Base Excess: 3.7 mmol/L — ABNORMAL HIGH (ref 0.0–2.0)
Bicarbonate: 27.7 mmol/L (ref 20.0–28.0)
O2 Content: 4 L/min
O2 Saturation: 70.8 %
Patient temperature: 37
pCO2, Ven: 39 mmHg — ABNORMAL LOW (ref 44–60)
pH, Ven: 7.46 — ABNORMAL HIGH (ref 7.25–7.43)
pO2, Ven: 42 mmHg (ref 32–45)

## 2024-03-15 LAB — HEPARIN LEVEL (UNFRACTIONATED): Heparin Unfractionated: 0.47 [IU]/mL (ref 0.30–0.70)

## 2024-03-15 LAB — RESP PANEL BY RT-PCR (RSV, FLU A&B, COVID)  RVPGX2
Influenza A by PCR: NEGATIVE
Influenza B by PCR: NEGATIVE
Resp Syncytial Virus by PCR: NEGATIVE
SARS Coronavirus 2 by RT PCR: NEGATIVE

## 2024-03-15 LAB — CBC
HCT: 26.5 % — ABNORMAL LOW (ref 36.0–46.0)
HCT: 28.4 % — ABNORMAL LOW (ref 36.0–46.0)
Hemoglobin: 8.7 g/dL — ABNORMAL LOW (ref 12.0–15.0)
Hemoglobin: 9.1 g/dL — ABNORMAL LOW (ref 12.0–15.0)
MCH: 31.3 pg (ref 26.0–34.0)
MCH: 30.7 pg (ref 26.0–34.0)
MCHC: 32.8 g/dL (ref 30.0–36.0)
MCHC: 32 g/dL (ref 30.0–36.0)
MCV: 95.3 fL (ref 80.0–100.0)
MCV: 95.9 fL (ref 80.0–100.0)
Platelets: 255 K/uL (ref 150–400)
Platelets: 272 K/uL (ref 150–400)
RBC: 2.78 MIL/uL — ABNORMAL LOW (ref 3.87–5.11)
RBC: 2.96 MIL/uL — ABNORMAL LOW (ref 3.87–5.11)
RDW: 13.8 % (ref 11.5–15.5)
RDW: 14 % (ref 11.5–15.5)
WBC: 5.7 K/uL (ref 4.0–10.5)
WBC: 6.5 K/uL (ref 4.0–10.5)
nRBC: 0 % (ref 0.0–0.2)
nRBC: 0 % (ref 0.0–0.2)

## 2024-03-15 LAB — PHOSPHORUS: Phosphorus: 3.2 mg/dL (ref 2.5–4.6)

## 2024-03-15 LAB — VITAMIN B12: Vitamin B-12: 224 pg/mL (ref 180–914)

## 2024-03-15 LAB — MAGNESIUM: Magnesium: 1.9 mg/dL (ref 1.7–2.4)

## 2024-03-15 MED ORDER — ARFORMOTEROL TARTRATE 15 MCG/2ML IN NEBU
15.0000 ug | INHALATION_SOLUTION | Freq: Two times a day (BID) | RESPIRATORY_TRACT | Status: DC
  Administered 2024-03-15 – 2024-03-22 (×13): 15 ug via RESPIRATORY_TRACT
  Filled 2024-03-15 (×19): qty 2

## 2024-03-15 MED ORDER — METHYLPREDNISOLONE SODIUM SUCC 40 MG IJ SOLR
40.0000 mg | INTRAMUSCULAR | Status: AC
  Administered 2024-03-17 – 2024-03-19 (×3): 40 mg via INTRAVENOUS
  Filled 2024-03-15 (×3): qty 1

## 2024-03-15 MED ORDER — POLYETHYLENE GLYCOL 3350 17 G PO PACK
17.0000 g | PACK | Freq: Two times a day (BID) | ORAL | Status: DC
  Administered 2024-03-15: 17 g via ORAL
  Filled 2024-03-15: qty 1

## 2024-03-15 MED ORDER — FUROSEMIDE 10 MG/ML IJ SOLN: 20.0000 mg | Freq: Once | INTRAMUSCULAR | Status: DC

## 2024-03-15 MED ORDER — PREDNISONE 10 MG PO TABS: 10.0000 mg | ORAL_TABLET | Freq: Every day | ORAL | Status: DC

## 2024-03-15 MED ORDER — BISACODYL 5 MG PO TBEC
10.0000 mg | DELAYED_RELEASE_TABLET | Freq: Once | ORAL | Status: AC
  Administered 2024-03-15: 10 mg via ORAL
  Filled 2024-03-15: qty 2

## 2024-03-15 MED ORDER — PANTOPRAZOLE SODIUM 40 MG IV SOLR
40.0000 mg | INTRAVENOUS | Status: DC
  Administered 2024-03-15: 40 mg via INTRAVENOUS
  Filled 2024-03-15: qty 10

## 2024-03-15 MED ORDER — PREDNISONE 20 MG PO TABS: 30.0000 mg | ORAL_TABLET | Freq: Every day | ORAL | Status: DC

## 2024-03-15 MED ORDER — IOHEXOL 300 MG/ML  SOLN
80.0000 mL | Freq: Once | INTRAMUSCULAR | Status: AC | PRN
  Administered 2024-03-15: 80 mL via INTRAVENOUS

## 2024-03-15 MED ORDER — PANTOPRAZOLE SODIUM 40 MG IV SOLR
40.0000 mg | Freq: Two times a day (BID) | INTRAVENOUS | Status: DC
  Administered 2024-03-16 – 2024-03-19 (×7): 40 mg via INTRAVENOUS
  Filled 2024-03-15 (×7): qty 10

## 2024-03-15 MED ORDER — BISACODYL 5 MG PO TBEC: 10.0000 mg | DELAYED_RELEASE_TABLET | Freq: Every day | ORAL | Status: DC

## 2024-03-15 MED ORDER — BISACODYL 10 MG RE SUPP: 10.0000 mg | Freq: Every day | RECTAL | Status: DC | PRN

## 2024-03-15 MED ORDER — IOHEXOL 9 MG/ML PO SOLN: 500.0000 mL | ORAL | Status: AC

## 2024-03-15 MED ORDER — PHENOL 1.4 % MT LIQD
1.0000 | OROMUCOSAL | Status: DC | PRN
  Filled 2024-03-15: qty 177

## 2024-03-15 MED ORDER — SODIUM CHLORIDE 0.9 % IV SOLN: INTRAVENOUS | Status: AC

## 2024-03-15 MED ORDER — METHYLPREDNISOLONE SODIUM SUCC 40 MG IJ SOLR
40.0000 mg | Freq: Two times a day (BID) | INTRAMUSCULAR | Status: AC
  Administered 2024-03-15 – 2024-03-17 (×4): 40 mg via INTRAVENOUS
  Filled 2024-03-15 (×4): qty 1

## 2024-03-15 MED ORDER — PREDNISONE 20 MG PO TABS: 20.0000 mg | ORAL_TABLET | Freq: Every day | ORAL | Status: DC

## 2024-03-15 NOTE — Progress Notes (Signed)
 PHARMACY - ANTICOAGULATION CONSULT NOTE  Pharmacy Consult for Heparin  infusion  Indication: Peripheral arterial disease, limb ischemia   No Known Allergies  Patient Measurements: Height: 5' 1 (154.9 cm) Weight: 42.2 kg (93 lb) IBW/kg (Calculated) : 47.8 HEPARIN  DW (KG): 42.2  Vital Signs: Temp: 97.5 F (36.4 C) (08/03 0752) Temp Source: Oral (08/03 0504) BP: 122/63 (08/03 0752) Pulse Rate: 81 (08/03 0752)  Labs: Recent Labs    03/14/24 0709 03/14/24 1914 03/15/24 0714  HGB 12.9  --   --   HCT 40.6  --   --   PLT 390  --   --   APTT 30  --   --   LABPROT 13.5  --   --   INR 1.0  --   --   HEPARINUNFRC  --  0.46 0.47  CREATININE 0.64  --   --   TROPONINIHS 7  --   --     Estimated Creatinine Clearance: 33 mL/min (by C-G formula based on SCr of 0.64 mg/dL).   Medical History: Past Medical History:  Diagnosis Date   AK (actinic keratosis) 12/08/2020   right pretibia inferior bx proven, LN2 01/10/21   Burping    Chronic airway obstruction, not elsewhere classified    Dyspnea    Dysrhythmia    Macular degeneration (senile) of retina, unspecified    Malignant neoplasm of urinary bladder (HCC) 03/2019   Partial bladder resection and Rad tx's.    Obstructive chronic bronchitis with exacerbation (HCC)    Osteoporosis, unspecified    Oxygen  deficiency    Personal history of peptic ulcer disease    SCC (squamous cell carcinoma) 12/08/2020   right pretibia superior, EDC 01/10/21   SCC (squamous cell carcinoma) 07/03/2022   left medial calf, clear 09/25/22   SCC (squamous cell carcinoma) 11/22/2022   right thigh   treated with ED&C   Squamous cell carcinoma in situ (SCCIS) 07/03/2022   right pretibia sccis needs treatment with fluorouracil    Tobacco use disorder    Unspecified essential hypertension    Unspecified glaucoma(365.9)      Assessment: 89 yo female w/PMH of PVD, COPD, and bladder carcinoma admitted with respiratory distress and no pulse in left leg.   Pharmacy has been consulted for heparin  infusion dosing and monitoring for limb ischemia.   Goal of Therapy:  Heparin  level 0.3-0.7 units/ml Monitor platelets by anticoagulation protocol: Yes  8/02 @ 1914: HL 0.46  Therapeutic X 1 8/03 @ 0714: HL 0.47  Therapeutic X 2  Plan:  Continue heparin  infusion at 650 units/hr Recheck anti-Xa level tomorrow with morning labs Continue to monitor H&H and platelets. CBC daily per protocol.    Gaelen Brager A Fawnda Vitullo, PharmD Clinical Pharmacist 03/15/2024 8:13 AM

## 2024-03-15 NOTE — Progress Notes (Incomplete)
 Goals of care discussion DNR/DNI D/w pts spouse that her condition is critical

## 2024-03-15 NOTE — Progress Notes (Addendum)
 Brief pulmonary consult note;  Misty Ferguson is an 89 year old female patient with a past medical history of advanced COPD complicated by chronic hypoxic respiratory failure on home oxygen , hypertension, PAD presenting to [facility] with acute hypoxic respiratory failure requiring initially BiPAP support.  I have been asked by the primary team to evaluate her for worsening work of breathing.  She presented on 08/02 for acute hypoxic respiratory failure requiring BiPAP support.  She was diagnosed with acute exacerbation of COPD and was placed on BiPAP.  She was started on steroids and azithromycin  and DuoNebs scheduled.  Also course complicated by no dopplerable pulses in her left lower extremity and therefore vascular surgery was consulted and she was started on heparin  drip.  She was noted to have multiple bruises and therefore multiple x-rays were obtained as well as CT head and CT cervical spine and maxillofacial.  Relevant finding includes a loculated moderate or possibly large layering pleural effusion.  Physical exam GEN: Cachectic frail elderly female with mild respiratory distress HEENT supple neck, reactive pupils, EOMI CVS normal S1, normal S2, regular rate and rhythm Lungs diminished breath sounds bilaterally mostly diminished over the left hemithorax. Extremities no edema multiple bruises noted.  Labs and imaging were reviewed.  H&H stable.  Assessment and plan Misty Ferguson is an 89 year old female patient with a past medical history of advanced COPD complicated by chronic hypoxic respiratory failure on home oxygen , hypertension, PAD presenting to [facility] with acute hypoxic respiratory failure requiring initially BiPAP support.  I have been asked by the primary team to evaluate her for worsening work of breathing.  Her increased work of breathing is multifactorial in the setting of COPD exacerbation and large left loculated pleural effusion with differential including exudative versus  transudative.  I am worried of a malignant process as she had few left-sided pulmonary nodules in the past in a background of extensive emphysema.  # Acute COPD exacerbation # Severe COPD with chronic hypoxic respiratory failure # New left-sided pleural effusion with differential as above # PAD with questionable emboli in the left lower extremity currently on heparin  drip.  []  Agree with systemic steroids.  DuoNebs every 6 hours scheduled.  Agree with current antibiotic regimen per primary team. []  Agree with obtaining CT chest without contrast. []  Given she is anticoagulated she would be at high risk for bedside thoracentesis or chest tube placement and would recommend pursuing IR guided thoracentesis in the a.m. if that is within patient's wishes. []  Can consider low-dose Lasix . []  Rest of care per primary team.  Pulmonary Team will follow.   I spent 55 minutes caring for this patient today, including preparing to see the patient, obtaining a medical history , reviewing a separately obtained history, performing a medically appropriate examination and/or evaluation, counseling and educating the patient/family/caregiver, documenting clinical information in the electronic health record, and independently interpreting results (not separately reported/billed) and communicating results to the patient/family/caregiver  Darrin Barn, MD [facility] 03/15/2024 4:48 PM

## 2024-03-15 NOTE — Plan of Care (Signed)

## 2024-03-15 NOTE — Consult Note (Signed)
 Consultation Note Date: 03/15/2024   Patient Name: Misty Ferguson  DOB: [birth year]  MRN: [id number]  Age / Sex: 89 y.o., female  PCP: Avelina Greig BRAVO, MD Referring Physician: Von Bellis, MD  Reason for Consultation: Establishing goals of care   HPI/Brief Hospital Course: 89 y.o. female  with past medical history of advanced COPD with chronic respiratory failure requiring 2-4L [place] at baseline, HTN, PAD followed closely by vascular surgery s/p x3 angiograms LLE, history of bladder cancer s/p TURBT and chemo/radiation, and pleural nodule admitted from home on 03/14/2024 with fall at home. On EMS arrival to her home she was found hypoxic and in respiratory distress. On arrival to ED placed on bipap-quickly transitioned to . LLE without a pulse even with doppler-Dr. Marea consulted, recommend initiation of heparin  due to medical instability--limb ischemia.  Pulmonary team consulted-large left loculated pleural effusion, recommend CT imaging, may need thoracentesis (IR guided due to high risk nature)  Course has been further complicated by possible SBO on imaging-pending CT abd/pelvis, general surgery consulted  Palliative medicine was consulted for assisting with goals of care conversations.  Subjective:  Extensive chart review has been completed prior to meeting patient including labs, vital signs, imaging, progress notes, orders, and available advanced directive documents from current and previous encounters.  Visited with Ms. Misty Ferguson at her bedside. She is awake, alert and able to engage in conversations. No family or visitors at bedside during time of visit.  Introduced myself as a Publishing rights manager as a member of the palliative care team. Explained palliative medicine is specialized medical care for people living with serious illness. It focuses on providing relief from the symptoms and stress of a serious illness. The goal is to improve quality of life for both  the patient and the family.   Ms. Misty Ferguson expresses feeling overwhelmed by the multiple providers and continued information she continues to receive. She shares she is struggling to understand and process all of the information she has been provided.  We had a detailed and in depth conversation about current medical conditions. Limb ischemia, compromised respiratory status and possible SBO. We also discussed all interventions/treatments options that have been presented to her. Her husband-Eric joins at bedside during our conversations.  She and Misty Ferguson have been married for many years--they have 3 children and many grandchildren, all out of state.  We discussed patient's current illness and what it means in the larger context of patient's on-going co-morbidities. Natural disease trajectory and expectations at EOL were discussed.   Ms. Misty Ferguson shares she is struggling to accept her health decline. She also shares she is afraid to agree to all interventions being offered to her as she does not want to prolong her suffering. She shares she recognizes her advanced age and has a realistic outlook on quality versus quantity of life.  Attempted to elicit goals of care conversations. We discussed code status and reviewed documents in Vyn[employer]--DNR form and MOST form reflecting DNR/DNI. Ms. Misty Ferguson and her husband express wishes for DNR/DNI.  The difference between aggressive medical intervention and comfort care was discussed.  Ms. Misty Ferguson shares at this time she wishes to continue with current plan of care but would like time to process and consider her options moving forward.  Ms. Misty Ferguson shares she has struggled with chronic constipation for many years. She will have small, hard bowel movements several times per day. She never feels as though she fully empties herself. She will occasionally have to take a laxative that relieves  her constipation symptoms.  Assessed current symptoms. She denies abdominal pain, nausea,  vomiting, reports pain to her left lower extremity but manageable at this time.  I discussed importance of continued conversations with family/support persons and all members of their medical team regarding overall plan of care and treatment options ensuring decisions are in alignment with patients goals of care.  All questions/concerns addressed. Emotional support provided to patient/family/support persons. PMT will continue to follow and support patient as needed.   Objective: Primary Diagnoses: Present on Admission:  CAP (community acquired pneumonia)   Physical Exam Constitutional:      General: She is not in acute distress.    Appearance: She is cachectic. She is ill-appearing.  Abdominal:     General: There is no distension.     Tenderness: There is no abdominal tenderness.  Skin:    General: Skin is warm and dry.     Findings: Bruising present.     Comments: Multiple skin tears  Neurological:     Mental Status: She is alert and oriented to person, place, and time.     Motor: Weakness present.  Psychiatric:        Mood and Affect: Mood is anxious.     Vital Signs: BP (!) 151/84 (BP Location: Left Arm)   Pulse 81   Temp 98 F (36.7 C)   Resp (!) 24   Ht 5' 1 (1.549 m)   Wt 42.2 kg   SpO2 97%   BMI 17.57 kg/m  Pain Scale: 0-10   Pain Score: 0-No pain  IO: Intake/output summary:  Intake/Output Summary (Last 24 hours) at 03/15/2024 1810 Last data filed at 03/15/2024 1500 Gross per 24 hour  Intake 1061.73 ml  Output 300 ml  Net 761.73 ml    LBM: Last BM Date :  (PTA) Baseline Weight: Weight: 40.4 kg Most recent weight: Weight: 42.2 kg       Assessment and Plan  SUMMARY OF RECOMMENDATIONS   DNR/DNI Continue with current plan of care-time for outcomes  Palliative Prophylaxis:   Bowel Regimen, Delirium Protocol and Frequent Pain Assessment  Discussed With: Primary team and nursing staff   Thank you for this consult and allowing Palliative Medicine to  participate in the care of Misty Ferguson. Palliative medicine will continue to follow and assist as needed.   Time Total: 75 minutes  Time spent includes: Detailed review of medical records (labs, imaging, vital signs), medically appropriate exam (mental status, respiratory, cardiac, skin), discussed with treatment team, counseling and educating patient, family and staff, documenting clinical information, medication management and coordination of care.   Signed by: Waddell Lesches, DNP, AGNP-C Palliative Medicine    Please contact Palliative Medicine Team phone at [phone] for questions and concerns.  For individual provider: See Tracey

## 2024-03-15 NOTE — H&P (View-Only) (Signed)
 [facility] Ferguson and Vascular [facility]  Daily Progress Note   Subjective  -   Patient still complaining of left foot pain.  Does have heel pads on.  Overall feeling fairly poorly.  Objective Vitals:   03/15/24 0144 03/15/24 0504 03/15/24 0752 03/15/24 0807  BP:  130/71 122/63   Pulse:  81 81   Resp:  20 20   Temp:  (!) 97.4 F (36.3 C) (!) 97.5 F (36.4 C)   TempSrc:  Oral    SpO2: 98% 100% 100% 97%  Weight:      Height:        Intake/Output Summary (Last 24 hours) at 03/15/2024 0909 Last data filed at 03/15/2024 0634 Gross per 24 hour  Intake 591.09 ml  Output 600 ml  Net -8.91 ml    PULM  diminished.  No increased respiratory effort on supplemental oxygen  CV  RRR VASC  left foot remains cool with sluggish capillary refill.  No palpable pulses.  Motor function intact.  Laboratory CBC    Component Value Date/Time   WBC 5.7 03/15/2024 0714   HGB 8.7 (L) 03/15/2024 0714   HGB 12.4 04/30/2019 1613   HCT 26.5 (L) 03/15/2024 0714   HCT 37.1 04/30/2019 1613   PLT 255 03/15/2024 0714   PLT 295 04/30/2019 1613    BMET    Component Value Date/Time   NA 140 03/14/2024 0709   NA 141 06/10/2019 1324   K 4.7 03/14/2024 0709   K 4.1 07/23/2012 1536   CL 104 03/14/2024 0709   CO2 25 03/14/2024 0709   GLUCOSE 176 (H) 03/14/2024 0709   BUN 21 03/14/2024 0709   BUN 15 06/10/2019 1324   CREATININE 0.64 03/14/2024 0709   CREATININE 1.26 (H) 12/23/2012 1440   CALCIUM  9.2 03/14/2024 0709   GFRNONAA >60 03/14/2024 0709   GFRAA 62 06/10/2019 1324    Assessment/Planning:   Subacute on chronic ischemia of the left lower extremity with likely thrombosis of previous interventions On a heparin  drip.  Has rest pain. Her respiratory status seems to be reasonably near her baseline which is not great. If felt to be medically stable by the primary service, I will plan to take her to the angiogram suite tomorrow in hopes of revascularization of the left lower extremity Continue  heparin  drip This is clearly a critical and limb threatening situation   Misty Ferguson  03/15/2024, 9:09 AM

## 2024-03-15 NOTE — Progress Notes (Signed)
 PT Cancellation Note  Patient Details Name: Misty Ferguson MRN: [id number] DOB: 1934/03/03   Cancelled Treatment:    Reason Eval/Treat Not Completed: Other (comment) Pt laying in bed in NAD, pleasant and interactive.  She reports that she slept very poorly last night and just wants to rest, would like to wait to get up with PT until after her revascularization procedure (scheduled 8/4).  Family in room and in agreement.  [name], DPT 03/15/2024, 12:18 PM

## 2024-03-15 NOTE — Progress Notes (Signed)
 Pt to CT with transport.

## 2024-03-15 NOTE — Progress Notes (Signed)
 [name]  Daily Progress Note   Subjective  -   Patient still complaining of left foot pain.  Does have heel pads on.  Overall feeling fairly poorly.  Objective Vitals:   03/15/24 0144 03/15/24 0504 03/15/24 0752 03/15/24 0807  BP:  130/71 122/63   Pulse:  81 81   Resp:  20 20   Temp:  (!) 97.4 F (36.3 C) (!) 97.5 F (36.4 C)   TempSrc:  Oral    SpO2: 98% 100% 100% 97%  Weight:      Height:        Intake/Output Summary (Last 24 hours) at 03/15/2024 0909 Last data filed at 03/15/2024 0634 Gross per 24 hour  Intake 591.09 ml  Output 600 ml  Net -8.91 ml    PULM  diminished.  No increased respiratory effort on supplemental oxygen  CV  RRR VASC  left foot remains cool with sluggish capillary refill.  No palpable pulses.  Motor function intact.  Laboratory CBC    Component Value Date/Time   WBC 5.7 03/15/2024 0714   HGB 8.7 (L) 03/15/2024 0714   HGB 12.4 04/30/2019 1613   HCT 26.5 (L) 03/15/2024 0714   HCT 37.1 04/30/2019 1613   PLT 255 03/15/2024 0714   PLT 295 04/30/2019 1613    BMET    Component Value Date/Time   NA 140 03/14/2024 0709   NA 141 06/10/2019 1324   K 4.7 03/14/2024 0709   K 4.1 07/23/2012 1536   CL 104 03/14/2024 0709   CO2 25 03/14/2024 0709   GLUCOSE 176 (H) 03/14/2024 0709   BUN 21 03/14/2024 0709   BUN 15 06/10/2019 1324   CREATININE 0.64 03/14/2024 0709   CREATININE 1.26 (H) 12/23/2012 1440   CALCIUM  9.2 03/14/2024 0709   GFRNONAA >60 03/14/2024 0709   GFRAA 62 06/10/2019 1324    Assessment/Planning:   Subacute on chronic ischemia of the left lower extremity with likely thrombosis of previous interventions On a heparin  drip.  Has rest pain. Her respiratory status seems to be reasonably near her baseline which is not great. If felt to be medically stable by the primary service, I will plan to take her to the angiogram suite tomorrow in hopes of revascularization of the left lower extremity Continue  heparin  drip This is clearly a critical and limb threatening situation   Misty Ferguson  03/15/2024, 9:09 AM

## 2024-03-15 NOTE — Progress Notes (Addendum)
 [facility] Progress Note  Patient: Misty Ferguson    FMW:[id number]  DOA: 03/14/2024     Date of Service: the patient was seen and examined on 03/15/2024  Chief Complaint  Patient presents with   Respiratory Distress   Brief hospital course: Kisa M Andre is a 89 y.o. female with medical history significant of Severe COPD  , Chronic bronchiectasis , chronic hypoxic respiratory failure home O2 with exertion and at bedtime ,  Pleural nodule, HTN, PAD with symptoms predominantly in the left leg , HLD, Prediabetes, bladder CA s/p TURBT 05/22/2019 s/p chemo and xrt,tobacco abuse who presents to ED BIB EMS S/p call out for a fall. However on arrival EMS noted patient was in respiratory distress.  She was found sitting on toilet with multiple skin tears noted.  Her O2 sat was 85% . Of note patient is on home O2 with exertion and at bedtime but typically maintains sats well at rest on RA. Patient was treated with albuterol   and transported to ED. Of note if patient has any head trauma. Patient does not complaint of any HA.   ED Course:  Patient was diagnosed with AECOPD/CAP, s/p Bipap, transition to O2 via [place] further management as below.   LLE, no dopplerable pulse.  Seen by Dr. Marea vascular surgery and started heparin  IV infusion. TRH was consulted for admission and further management as below.   Assessment and Plan:  Addendum # Small bowel obstruction 8/3 respiratory status got worse and patient was not feeling right, also having constipation, not passing gas.  Feeling abdominal pressure and abdominal tenderness. KUB positive for small bowel obstruction CT scan is pending Keep n.p.o., IV fluid and NG tube insertion with intermittent suction. General surgery consulted.   # Acute on chronic hypoxic respiratory failure due to pneumonia and COPD exacerbation On 2 L oxygen  at baseline S/p BiPAP in the ED, use BiPAP as needed Continue supplemental O2 admission and gradually wean down to  baseline Continue breathing treatment Status post IV Solu-Medrol , continue prednisone  oral  # Community-acquired pneumonia Continue cefepime  and azithromycin  Mucinex  600 twice daily Pleural effusion on left side most likely due to [facility] pneumonia Follow CT chest, patient may need thoracentesis tomorrow a.m.   # Acute on chronic critical limb ischemia left lower extremity Continue Plavix  75 mg p.o. daily Continue heparin  IV infusion Vascular surgery following, possible angiogram tomorrow a.m., depends on her respiratory status.   # Hypertension, HLD Continue losartan  25 twice daily, verapamil  360 mg, Lipitor 10 mg daily Monitor BP and titrate medications accordingly  # Constipation, continue laxatives  # Multiple skin tears right upper and lower extremity Continue wound care Continue fall precaution Ambulate with assistance Follow PT OT eval   # Prediabetes -monitor poc   # Hx Bladder CA s/p TURBT 05/22/2019 s/p chemo and xrt -monitor of for hematuria , due to patient history    # Tobacco abuse -in remission    # Pleural nodule -followed by pulmonary Dr Isaiah    Body mass index is 17.57 kg/m.  Interventions:  Diet: Heart healthy diet DVT Prophylaxis: Heparin  IV infusion  Advance goals of care discussion: Full code  Family Communication: family was present at bedside, at the time of interview.  The pt provided permission to discuss medical plan with the family. Opportunity was given to ask question and all questions were answered satisfactorily.   Disposition:  Pt is from Home, admitted with CAP, COPD, Resp Failure, critical LLE limb ischemia, on IV  heparin  infusion and IV antibiotics, which precludes a safe discharge. Discharge to home versus SNF TBD after improvement, when stable, may need few days to improve.  Subjective: Patient was admitted yesterday due to respiratory failure, s/p BiPAP, breathing improved but still has significant shortness of  breath, complaining of pain in the right arm and leg due to skin tears, dressing is intact, no active bleeding.  Denied any chest pain or palpitations, no any other active issues. Discussed about CT scan chest to be done and may need thoracentesis which will be done tomorrow if needed.  Physical Exam: General: NAD, lying comfortably, mild to moderate SOB Appear in mild distress, affect slightly depressed Eyes: PERRLA ENT: Oral Mucosa Clear, moist  Neck: no JVD,  Cardiovascular: S1 and S2 Present, no Murmur,  Respiratory: Increased work of breathing, decreased breath sounds on the left lower side, mild crackles, no wheezes  Abdomen: Bowel Sound present, Soft and no tenderness,  Skin: Multiple skin tears right arm and right leg, multiple bruises.  Dressing intact.   Extremities: LLE: Decreased pulses and slightly cold.  Pedal edema, no calf tenderness. Neurologic: without any new focal findings Gait not checked due to patient safety concerns  Vitals:   03/15/24 0752 03/15/24 0807 03/15/24 0931 03/15/24 1123  BP: 122/63  122/63 (!) 103/53  Pulse: 81   88  Resp: 20   20  Temp: (!) 97.5 F (36.4 C)   98 F (36.7 C)  TempSrc:      SpO2: 100% 97%  97%  Weight:      Height:        Intake/Output Summary (Last 24 hours) at 03/15/2024 1312 Last data filed at 03/15/2024 0900 Gross per 24 hour  Intake 481.09 ml  Output 600 ml  Net -118.91 ml   Filed Weights   03/14/24 0711 03/14/24 2142  Weight: 40.4 kg 42.2 kg    Data Reviewed: I have personally reviewed and interpreted daily labs, tele strips, imagings as discussed above. I reviewed all nursing notes, pharmacy notes, vitals, pertinent old records I have discussed plan of care as described above with RN and patient/family.  CBC: Recent Labs  Lab 03/14/24 0709 03/15/24 0714 03/15/24 0903  WBC 11.7* 5.7 6.5  HGB 12.9 8.7* 9.1*  HCT 40.6 26.5* 28.4*  MCV 97.1 95.3 95.9  PLT 390 255 272   Basic Metabolic Panel: Recent Labs   Lab 03/14/24 0709 03/15/24 0903  NA 140 140  K 4.7 4.4  CL 104 105  CO2 25 26  GLUCOSE 176* 149*  BUN 21 22  CREATININE 0.64 0.62  CALCIUM  9.2 8.8*  MG  --  1.9  PHOS  --  3.2    Studies: No results found.  Scheduled Meds:  aspirin  EC  81 mg Oral Daily   atorvastatin   10 mg Oral Daily   [START ON 03/16/2024] bisacodyl   10 mg Oral QHS   budesonide -glycopyrrolate -formoterol   2 puff Inhalation QHS   clopidogrel   75 mg Oral Daily   docusate sodium   100 mg Oral BID   guaiFENesin   600 mg Oral BID   ipratropium-albuterol   3 mL Nebulization Q6H   losartan   25 mg Oral Daily   polyethylene glycol  17 g Oral BID   predniSONE   40 mg Oral Q breakfast   verapamil   360 mg Oral Daily   Continuous Infusions:  azithromycin  500 mg (03/15/24 1048)   ceFEPime  (MAXIPIME ) IV Stopped (03/15/24 0933)   heparin  650 Units/hr (03/14/24 2045)  PRN Meds: albuterol , bisacodyl , morphine  injection, ondansetron  **OR** ondansetron  (ZOFRAN ) IV, oxyCODONE   Time spent: 55 minutes  Author: ELVAN SOR. MD [facility] 03/15/2024 1:12 PM  To reach On-call, see care teams to locate the attending and reach out to them via [email or web address]. If 7PM-7AM, please contact night-coverage If you still have difficulty reaching the attending provider, please page the [facility] (Director on Call) for [facility] on amion for assistance.

## 2024-03-15 NOTE — Plan of Care (Signed)
  Problem: Education: Goal: Knowledge of General Education information will improve Description: Including pain rating scale, medication(s)/side effects and non-pharmacologic comfort measures Outcome: Progressing   Problem: Clinical Measurements: Goal: Ability to maintain clinical measurements within normal limits will improve Outcome: Progressing Goal: Will remain free from infection Outcome: Progressing Goal: Diagnostic test results will improve Outcome: Progressing   Problem: Activity: Goal: Risk for activity intolerance will decrease Outcome: Progressing   Problem: Nutrition: Goal: Adequate nutrition will be maintained Outcome: Progressing   Problem: Elimination: Goal: Will not experience complications related to bowel motility Outcome: Progressing   Problem: Pain Managment: Goal: General experience of comfort will improve and/or be controlled Outcome: Progressing   Problem: Safety: Goal: Ability to remain free from injury will improve Outcome: Progressing

## 2024-03-16 ENCOUNTER — Inpatient Hospital Stay

## 2024-03-16 ENCOUNTER — Encounter
Admission: EM | Disposition: A | Discharge status: Home Health | Payer: Self-pay | Source: Home / Self Care | Attending: Student

## 2024-03-16 DIAGNOSIS — J189 Pneumonia, unspecified organism: Secondary | ICD-10-CM | POA: Diagnosis not present

## 2024-03-16 DIAGNOSIS — I70222 Atherosclerosis of native arteries of extremities with rest pain, left leg: Secondary | ICD-10-CM | POA: Diagnosis not present

## 2024-03-16 DIAGNOSIS — K56609 Unspecified intestinal obstruction, unspecified as to partial versus complete obstruction: Secondary | ICD-10-CM | POA: Diagnosis not present

## 2024-03-16 DIAGNOSIS — R918 Other nonspecific abnormal finding of lung field: Secondary | ICD-10-CM | POA: Diagnosis not present

## 2024-03-16 DIAGNOSIS — T82868A Thrombosis of vascular prosthetic devices, implants and grafts, initial encounter: Secondary | ICD-10-CM

## 2024-03-16 DIAGNOSIS — J4489 Other specified chronic obstructive pulmonary disease: Secondary | ICD-10-CM | POA: Diagnosis not present

## 2024-03-16 DIAGNOSIS — R0602 Shortness of breath: Secondary | ICD-10-CM | POA: Diagnosis not present

## 2024-03-16 DIAGNOSIS — T82856A Stenosis of peripheral vascular stent, initial encounter: Secondary | ICD-10-CM

## 2024-03-16 DIAGNOSIS — J90 Pleural effusion, not elsewhere classified: Secondary | ICD-10-CM | POA: Diagnosis not present

## 2024-03-16 HISTORY — PX: LOWER EXTREMITY ANGIOGRAPHY: CATH118251

## 2024-03-16 LAB — BASIC METABOLIC PANEL WITH GFR
Anion gap: 12 (ref 5–15)
BUN: 24 mg/dL — ABNORMAL HIGH (ref 8–23)
CO2: 24 mmol/L (ref 22–32)
Calcium: 8.7 mg/dL — ABNORMAL LOW (ref 8.9–10.3)
Chloride: 106 mmol/L (ref 98–111)
Creatinine, Ser: 0.57 mg/dL (ref 0.44–1.00)
GFR, Estimated: >60 mL/min (ref >60)
Glucose, Bld: 121 mg/dL — ABNORMAL HIGH (ref 70–99)
Potassium: 4.4 mmol/L (ref 3.5–5.1)
Sodium: 142 mmol/L (ref 135–145)

## 2024-03-16 LAB — HEPARIN LEVEL (UNFRACTIONATED): Heparin Unfractionated: 0.56 [IU]/mL (ref 0.30–0.70)

## 2024-03-16 LAB — HEMOGLOBIN AND HEMATOCRIT, BLOOD
HCT: 21.3 % — ABNORMAL LOW (ref 36.0–46.0)
Hemoglobin: 7 g/dL — ABNORMAL LOW (ref 12.0–15.0)

## 2024-03-16 LAB — CBC
HCT: 23.5 % — ABNORMAL LOW (ref 36.0–46.0)
Hemoglobin: 7.5 g/dL — ABNORMAL LOW (ref 12.0–15.0)
MCH: 30.5 pg (ref 26.0–34.0)
MCHC: 31.9 g/dL (ref 30.0–36.0)
MCV: 95.5 fL (ref 80.0–100.0)
Platelets: 267 K/uL (ref 150–400)
RBC: 2.46 MIL/uL — ABNORMAL LOW (ref 3.87–5.11)
RDW: 14.4 % (ref 11.5–15.5)
WBC: 9.8 K/uL (ref 4.0–10.5)
nRBC: 0 % (ref 0.0–0.2)

## 2024-03-16 LAB — VITAMIN D 25 HYDROXY (VIT D DEFICIENCY, FRACTURES): Vit D, 25-Hydroxy: 64.2 ng/mL (ref 30–100)

## 2024-03-16 LAB — PHOSPHORUS: Phosphorus: 3.5 mg/dL (ref 2.5–4.6)

## 2024-03-16 LAB — MAGNESIUM: Magnesium: 2 mg/dL (ref 1.7–2.4)

## 2024-03-16 SURGERY — LOWER EXTREMITY ANGIOGRAPHY
Anesthesia: Moderate Sedation | Laterality: Left

## 2024-03-16 MED ORDER — CEFAZOLIN SODIUM-DEXTROSE 1-4 GM/50ML-% IV SOLN: 1.0000 g | INTRAVENOUS | Status: DC

## 2024-03-16 MED ORDER — IPRATROPIUM-ALBUTEROL 0.5-2.5 (3) MG/3ML IN SOLN
3.0000 mL | Freq: Three times a day (TID) | RESPIRATORY_TRACT | Status: DC
  Administered 2024-03-16 – 2024-03-17 (×3): 3 mL via RESPIRATORY_TRACT
  Filled 2024-03-16 (×4): qty 3

## 2024-03-16 MED ORDER — CEFAZOLIN SODIUM-DEXTROSE 1-4 GM/50ML-% IV SOLN
INTRAVENOUS | Status: AC
Start: 2024-03-16 — End: 2024-03-16
  Filled 2024-03-16: qty 50

## 2024-03-16 MED ORDER — SODIUM CHLORIDE 0.9% IV SOLUTION: Freq: Once | INTRAVENOUS | Status: AC

## 2024-03-16 MED ORDER — HEPARIN SODIUM (PORCINE) 10000 UNIT/ML IJ SOLN
INTRAMUSCULAR | Status: DC | PRN
  Administered 2024-03-16: 3000 [IU]

## 2024-03-16 MED ORDER — MIDAZOLAM HCL 2 MG/2ML IJ SOLN
INTRAMUSCULAR | Status: DC | PRN
Start: 2024-03-16 — End: 2024-03-16
  Administered 2024-03-16 (×2): .5 mg via INTRAVENOUS
  Administered 2024-03-16: 1 mg via INTRAVENOUS
  Administered 2024-03-16: .5 mg via INTRAVENOUS

## 2024-03-16 MED ORDER — FUROSEMIDE 10 MG/ML IJ SOLN
40.0000 mg | Freq: Once | INTRAMUSCULAR | Status: AC
  Administered 2024-03-16: 40 mg via INTRAVENOUS
  Filled 2024-03-16: qty 4

## 2024-03-16 MED ORDER — DIATRIZOATE MEGLUMINE & SODIUM 66-10 % PO SOLN
90.0000 mL | Freq: Once | ORAL | Status: AC
  Administered 2024-03-16: 90 mL via NASOGASTRIC

## 2024-03-16 MED ORDER — FENTANYL CITRATE (PF) 100 MCG/2ML IJ SOLN
INTRAMUSCULAR | Status: DC | PRN
  Administered 2024-03-16 (×2): 25 ug via INTRAVENOUS
  Administered 2024-03-16: 50 ug via INTRAVENOUS
  Administered 2024-03-16: 25 ug via INTRAVENOUS

## 2024-03-16 MED ORDER — IODIXANOL 320 MG/ML IV SOLN
INTRAVENOUS | Status: DC | PRN
  Administered 2024-03-16: 70 mL via INTRA_ARTERIAL

## 2024-03-16 MED ORDER — TIROFIBAN HCL IN NACL 5-0.9 MG/100ML-% IV SOLN
INTRAVENOUS | Status: AC
  Filled 2024-03-16: qty 100

## 2024-03-16 MED ORDER — FENTANYL CITRATE (PF) 100 MCG/2ML IJ SOLN
INTRAMUSCULAR | Status: AC
  Filled 2024-03-16: qty 2

## 2024-03-16 MED ORDER — LIDOCAINE-EPINEPHRINE (PF) 1 %-1:200000 IJ SOLN
INTRAMUSCULAR | Status: DC | PRN
  Administered 2024-03-16: 10 mL via INTRADERMAL

## 2024-03-16 MED ORDER — MIDAZOLAM HCL 5 MG/5ML IJ SOLN
INTRAMUSCULAR | Status: AC
  Filled 2024-03-16: qty 5

## 2024-03-16 MED ORDER — HEPARIN SODIUM (PORCINE) 1000 UNIT/ML IJ SOLN
INTRAMUSCULAR | Status: AC
  Filled 2024-03-16: qty 10

## 2024-03-16 MED ORDER — HEPARIN (PORCINE) IN NACL 1000-0.9 UT/500ML-% IV SOLN
INTRAVENOUS | Status: DC | PRN
  Administered 2024-03-16: 1000 mL

## 2024-03-16 MED ORDER — SODIUM CHLORIDE 0.9 % IV SOLN: INTRAVENOUS | Status: DC

## 2024-03-16 MED ORDER — TIROFIBAN HCL IN NACL 5-0.9 MG/100ML-% IV SOLN
0.0750 ug/kg/min | INTRAVENOUS | Status: AC
  Administered 2024-03-16: 0.075 ug/kg/min via INTRAVENOUS
  Filled 2024-03-16: qty 100

## 2024-03-16 SURGICAL SUPPLY — 27 items
BALLOON LUTONIX 018 4X40X130 (BALLOONS) IMPLANT
BALLOON LUTONIX 018 4X60X130 (BALLOONS) IMPLANT
BALLOON LUTONIX 018 5X150X130 (BALLOONS) IMPLANT
BALLOON LUTONIX DCB 5X100X130 (BALLOONS) IMPLANT
BALLOON ULTRVRSE 3X150X150 (BALLOONS) IMPLANT
CATH ANGIO 5F PIGTAIL 65CM (CATHETERS) IMPLANT
CATH BEACON 5 .038 100 VERT TP (CATHETERS) IMPLANT
CATH ROTAREX 135 6FR (CATHETERS) IMPLANT
CATH SEEKER .035X135CM (CATHETERS) IMPLANT
COVER PROBE ULTRASOUND 5X96 (MISCELLANEOUS) IMPLANT
DEVICE PRESTO INFLATION (MISCELLANEOUS) IMPLANT
DEVICE STARCLOSE SE CLOSURE (Vascular Products) IMPLANT
GLIDEWIRE ADV .035X260CM (WIRE) IMPLANT
GUIDEWIRE PFTE-COATED .018X300 (WIRE) IMPLANT
KIT MICROPUNCTURE VSI 5F STIFF (SHEATH) IMPLANT
PACK ANGIOGRAPHY (CUSTOM PROCEDURE TRAY) ×1 IMPLANT
SHEATH ANL2 6FRX45 HC (SHEATH) IMPLANT
SHEATH BRITE TIP 6FRX11 (SHEATH) IMPLANT
STENT VIABAHN 5X50X120 (Permanent Stent) IMPLANT
STENT VIABAHN 6X100X120 (Permanent Stent) IMPLANT
STENT VIABAHN 6X150X120 (Permanent Stent) IMPLANT
SYR MEDRAD MARK 7 150ML (SYRINGE) IMPLANT
TUBING CONTRAST HIGH PRESS 72 (TUBING) IMPLANT
WIRE COMMAND ST 018 300CM (WIRE) IMPLANT
WIRE COMMAND ST ANG 014 300 (WIRE) IMPLANT
WIRE G V18X300CM (WIRE) IMPLANT
WIRE J 3MM .035X145CM (WIRE) IMPLANT

## 2024-03-16 NOTE — Plan of Care (Signed)
 PMT following. Patient off unit and in procedure. PMT will continue to follow.

## 2024-03-16 NOTE — Progress Notes (Signed)
 PHARMACY - ANTICOAGULATION CONSULT NOTE  Pharmacy Consult for Heparin  infusion  Indication: Peripheral arterial disease, limb ischemia   No Known Allergies  Patient Measurements: Height: 5' 1 (154.9 cm) Weight: 42.2 kg (93 lb) IBW/kg (Calculated) : 47.8 HEPARIN  DW (KG): 42.2  Vital Signs: Temp: 97.8 F (36.6 C) (08/04 0609) Temp Source: Oral (08/04 0609) BP: 118/59 (08/04 0609) Pulse Rate: 90 (08/04 0609)  Labs: Recent Labs    03/14/24 0709 03/14/24 1914 03/15/24 0714 03/15/24 0903 03/16/24 0613  HGB 12.9  --  8.7* 9.1* 7.5*  HCT 40.6  --  26.5* 28.4* 23.5*  PLT 390  --  255 272 267  APTT 30  --   --   --   --   LABPROT 13.5  --   --   --   --   INR 1.0  --   --   --   --   HEPARINUNFRC  --  0.46 0.47  --  0.56  CREATININE 0.64  --   --  0.62  --   TROPONINIHS 7  --   --   --   --     Estimated Creatinine Clearance: 33 mL/min (by C-G formula based on SCr of 0.62 mg/dL).   Medical History: Past Medical History:  Diagnosis Date   AK (actinic keratosis) 12/08/2020   right pretibia inferior bx proven, LN2 01/10/21   Burping    Chronic airway obstruction, not elsewhere classified    Dyspnea    Dysrhythmia    Macular degeneration (senile) of retina, unspecified    Malignant neoplasm of urinary bladder (HCC) 03/2019   Partial bladder resection and Rad tx's.    Obstructive chronic bronchitis with exacerbation (HCC)    Osteoporosis, unspecified    Oxygen  deficiency    Personal history of peptic ulcer disease    SCC (squamous cell carcinoma) 12/08/2020   right pretibia superior, EDC 01/10/21   SCC (squamous cell carcinoma) 07/03/2022   left medial calf, clear 09/25/22   SCC (squamous cell carcinoma) 11/22/2022   right thigh   treated with ED&C   Squamous cell carcinoma in situ (SCCIS) 07/03/2022   right pretibia sccis needs treatment with fluorouracil    Tobacco use disorder    Unspecified essential hypertension    Unspecified glaucoma(365.9)       Assessment: 89 yo female w/PMH of PVD, COPD, and bladder carcinoma admitted with respiratory distress and no pulse in left leg.  Pharmacy has been consulted for heparin  infusion dosing and monitoring for limb ischemia.   Goal of Therapy:  Heparin  level 0.3-0.7 units/ml Monitor platelets by anticoagulation protocol: Yes  8/02 @ 1914: HL 0.46  Therapeutic X 1 8/03 @ 0714: HL 0.47  Therapeutic X 2 8/04 @ 0613: HL 0.56            Therapeutic X 3   Plan:  Continue heparin  infusion at 650 units/hr Recheck anti-Xa level tomorrow with morning labs Continue to monitor H&H and platelets. CBC daily per protocol.    Silver Selinda BIRCH, PharmD Clinical Pharmacist 03/16/2024 7:01 AM

## 2024-03-16 NOTE — Progress Notes (Signed)
 OT Cancellation Note  Patient Details Name: Misty Ferguson MRN: [id number] DOB: Feb 27, 1934   Cancelled Treatment:    Reason Eval/Treat Not Completed: Patient at procedure or test/ unavailable. Chart reviewed. Pt off unit for scheduled procedure, will evaluate next date as available.   Elston Slot, M.S. OTR/L  03/16/24, 9:43 AM  ascom [phone]

## 2024-03-16 NOTE — Progress Notes (Signed)
 [facility] Progress Note  Patient: Misty Ferguson    FMW:[id number]  DOA: 03/14/2024     Date of Service: the patient was seen and examined on 03/16/2024  Chief Complaint  Patient presents with   Respiratory Distress   Brief hospital course: Misty Ferguson is a 89 y.o. female with medical history significant of Severe COPD  , Chronic bronchiectasis , chronic hypoxic respiratory failure home O2 with exertion and at bedtime ,  Pleural nodule, HTN, PAD with symptoms predominantly in the left leg , HLD, Prediabetes, bladder CA s/p TURBT 05/22/2019 s/p chemo and xrt,tobacco abuse who presents to ED BIB EMS S/p call out for a fall. However on arrival EMS noted patient was in respiratory distress.  She was found sitting on toilet with multiple skin tears noted.  Her O2 sat was 85% . Of note patient is on home O2 with exertion and at bedtime but typically maintains sats well at rest on RA. Patient was treated with albuterol   and transported to ED. Of note if patient has any head trauma. Patient does not complaint of any HA.   ED Course:  Patient was diagnosed with AECOPD/CAP, s/p Bipap, transition to O2 via [place] further management as below.   LLE, no dopplerable pulse.  Seen by Dr. Marea vascular surgery and started heparin  IV infusion. TRH was consulted for admission and further management as below.   Assessment and Plan:  # Acute on chronic critical limb ischemia left lower extremity Continue Plavix  75 mg p.o. daily S/p Heparin  IV infusion, on 8/4 started Aggrastat  infusion after angiogram as per vascular surgery Vascular surgery consulted s/p angiogram done on 8/4   # Small bowel obstruction 8/3 respiratory status got worse and patient was not feeling right, also having constipation, not passing gas.  Feeling abdominal pressure and abdominal tenderness. KUB positive for small bowel obstruction CT a/p: Small-bowel obstruction, with dilated proximal small bowel measuring up to 3.6 cm. The  exact point of obstruction is not identified, but the distal jejunum and ileum are decompressed. Moderate retained stool throughout the colon. General surgery consulted. 8/4 SBFT: Contrast material throughout the colon with decompressed small bowel. No current evidence for bowel obstruction. 8/4 SBO resolved, NGT clamped, started clear liquid diet as per general surgery.   # Acute on chronic hypoxic respiratory failure due to pneumonia and COPD exacerbation On 2 L oxygen  at baseline S/p BiPAP in the ED, use BiPAP as needed Continue supplemental O2 admission and gradually wean down to baseline Continue breathing treatment Status post IV Solu-Medrol , continue prednisone  oral 8/3 CT chest: Right lower lobe bronchial wall thickening with areas of mucoid impaction, which could be sequela of aspiration or infection. Moderate partially loculated left pleural effusion, with compressive atelectasis in the dependent left lower lobe, not appreciably changed since prior study. 2. New trace free-flowing right pleural effusion with minimal right lower lobe atelectasis.  # Pleural effusion, left Patient may need thoracentesis, we will discuss tomorrow if remains stable.   # Community-acquired pneumonia Continue cefepime  and azithromycin  Mucinex  600 twice daily Pleural effusion on left side most likely due to [facility] pneumonia Follow CT chest, patient may need thoracentesis tomorrow a.m.   # Acute blood loss anemia 8/4 Hb 7.0, transfuse 1 unit of PRBC Monitor H&H and transfuse as needed    # Hypertension, HLD Continue losartan  25 twice daily, verapamil  360 mg, Lipitor 10 mg daily Monitor BP and titrate medications accordingly  # Constipation, due to SBO, now resolved May  benefit from laxatives at discharge.   # Multiple skin tears right upper and lower extremity Continue wound care Continue fall precaution Ambulate with assistance Follow PT OT eval   # Prediabetes -monitor poc    # Hx Bladder CA s/p TURBT 05/22/2019 s/p chemo and xrt -monitor of for hematuria , due to patient history    # Tobacco abuse -in remission    # Pleural nodule -followed by pulmonary Dr Isaiah    Body mass index is 17.58 kg/m.  Interventions:  Diet: Heart healthy diet DVT Prophylaxis: Aggrastat  infusion  Advance goals of care discussion: DNR/DNI, Limited  Family Communication: family was present at bedside, at the time of interview.  The pt provided permission to discuss medical plan with the family. Opportunity was given to ask question and all questions were answered satisfactorily.   Disposition:  Pt is from Home, admitted with CAP, COPD, Resp Failure, critical LLE limb ischemia, on IV heparin  infusion and IV antibiotics, which precludes a safe discharge. Discharge to home versus SNF TBD after improvement, when stable, may need few days to improve.  Subjective: No significant events overnight.  NG tube was placed yesterday due to SBO, today it seemed to resolve.  Patient was seen after angiogram, had left lower extremity pain 8-9/10, received morphine , pain improved to 4-5/10. Patient denied any abdominal pain, started on clear liquid diet, she is hungry and would like to eat solids.  Patient was advised to continue liquids today.  Physical Exam: General: NAD, lying comfortably, mild SOB Appear in mild distress, affect slightly depressed Eyes: PERRLA ENT: Oral Mucosa Clear, moist  Neck: no JVD,  Cardiovascular: S1 and S2 Present, no Murmur,  Respiratory: Equal air entry bilaterally, decreased breath sounds on the left lower side, mild crackles and but no wheezes Abdomen: Bowel Sound present, Soft and no tenderness,  Skin: Multiple skin tears right arm and right leg, multiple bruises.  Dressing intact.  Blood is oozing from the right hand, compression dressing intact Extremities: LLE: Palpable pulse and warm to touch, No Pedal edema, no calf tenderness.  Chronic  pigmentation Neurologic: without any new focal findings Gait not checked due to patient safety concerns  Vitals:   03/16/24 1245 03/16/24 1316 03/16/24 1415 03/16/24 1516  BP: 136/69 (!) 145/65 135/71 116/72  Pulse: 83 90 93 92  Resp: 16 (!) 24 18 16   Temp:   97.7 F (36.5 C) 97.6 F (36.4 C)  TempSrc:   Oral Oral  SpO2: 100% 97% 100% 100%  Weight:      Height:        Intake/Output Summary (Last 24 hours) at 03/16/2024 1551 Last data filed at 03/16/2024 0912 Gross per 24 hour  Intake 385.82 ml  Output 900 ml  Net -514.18 ml   Filed Weights   03/14/24 0711 03/14/24 2142 03/16/24 0924  Weight: 40.4 kg 42.2 kg 42.2 kg    Data Reviewed: I have personally reviewed and interpreted daily labs, tele strips, imagings as discussed above. I reviewed all nursing notes, pharmacy notes, vitals, pertinent old records I have discussed plan of care as described above with RN and patient/family.  CBC: Recent Labs  Lab 03/14/24 0709 03/15/24 0714 03/15/24 0903 03/16/24 0613  WBC 11.7* 5.7 6.5 9.8  HGB 12.9 8.7* 9.1* 7.5*  HCT 40.6 26.5* 28.4* 23.5*  MCV 97.1 95.3 95.9 95.5  PLT 390 255 272 267   Basic Metabolic Panel: Recent Labs  Lab 03/14/24 0709 03/15/24 0903 03/16/24 0613  NA 140  140 142  K 4.7 4.4 4.4  CL 104 105 106  CO2 25 26 24   GLUCOSE 176* 149* 121*  BUN 21 22 24*  CREATININE 0.64 0.62 0.57  CALCIUM  9.2 8.8* 8.7*  MG  --  1.9 2.0  PHOS  --  3.2 3.5    Studies: DG Abd Portable 1V-Small Bowel Obstruction Protocol-initial, 8 hr delay Result Date: 03/16/2024 CLINICAL DATA:  10 hour delay or small-bowel obstruction. EXAM: PORTABLE ABDOMEN - 1 VIEW COMPARISON:  CT and plain films earlier today. FINDINGS: Contrast material noted throughout to the colon. Small bowel is decompressed. Calcifications throughout the pancreas. No free air or organomegaly. NG tube in the stomach. IMPRESSION: Contrast material throughout the colon with decompressed small bowel. No current  evidence for bowel obstruction. Chronic pancreatitis changes. Electronically Signed   By: Franky Crease M.D.   On: 03/16/2024 12:55   PERIPHERAL VASCULAR CATHETERIZATION Result Date: 03/16/2024 See surgical note for result.  DG Abd 1 View Result Date: 03/15/2024 EXAM: 1 VIEW XRAY OF THE ABDOMEN 03/15/2024 09:33:04 PM COMPARISON: None available. CLINICAL HISTORY: Encounter for nasogastric (NG) tube placement; encounter for imaging study to confirm nasogastric tube placement. NG tube placement verification. FINDINGS: Enteric tube tip inside stomach. Remainder unchanged from same day CT given differences in technique. IMPRESSION: 1. Enteric tube tip is appropriately positioned within the stomach. Electronically signed by: Norman Gatlin MD 03/15/2024 09:41 PM EDT RP Workstation: [id number]   CT CHEST ABDOMEN PELVIS W CONTRAST Result Date: 03/15/2024 CLINICAL DATA:  Shortness of breath, abnormal chest x-ray, small-bowel obstruction EXAM: CT CHEST, ABDOMEN, AND PELVIS WITH CONTRAST TECHNIQUE: Multidetector CT imaging of the chest, abdomen and pelvis was performed following the standard protocol during bolus administration of intravenous contrast. RADIATION DOSE REDUCTION: This exam was performed according to the departmental dose-optimization program which includes automated exposure control, adjustment of the mA and/or kV according to patient size and/or use of iterative reconstruction technique. CONTRAST:  80mL OMNIPAQUE  IOHEXOL  300 MG/ML  SOLN COMPARISON:  09/04/2023, 03/15/2024 FINDINGS: CT CHEST FINDINGS Cardiovascular: The heart is unremarkable without pericardial effusion. No evidence of thoracic aortic aneurysm or dissection. Atherosclerosis of the aorta and coronary vasculature. Mediastinum/Nodes: No enlarged mediastinal, hilar, or axillary lymph nodes. Thyroid  gland, trachea, and esophagus demonstrate no significant findings. Lungs/Pleura: Stable partially loculated left pleural effusion with compressive  atelectasis within the left lower lobe. Trace free-flowing right pleural effusion with minimal dependent right lower lobe atelectasis. Upper lobe predominant emphysema. No pneumothorax. Right lower lobe bronchial wall thickening with areas of mucoid impaction. Musculoskeletal: No acute or destructive bony abnormalities. Reconstructed images demonstrate no additional findings. CT ABDOMEN PELVIS FINDINGS Hepatobiliary: No focal liver abnormality is seen. No gallstones, gallbladder wall thickening, or biliary dilatation. Pancreas: Stable diffuse pancreatic parenchymal atrophy with coarse calcifications compatible with sequela of chronic calcific pancreatitis. Stable cystic dilatation of pancreatic duct. No acute inflammatory changes. Spleen: Normal in size without focal abnormality. Adrenals/Urinary Tract: Adrenal glands are unremarkable. Kidneys are normal, without renal calculi, focal lesion, or hydronephrosis. Bladder is unremarkable. Stomach/Bowel: Dilated proximal small bowel measuring up to 3.6 cm, with numerous gas fluid levels, consistent with small-bowel obstruction. Exact transition point is not identified, but normal caliber distal jejunum and ileum are identified. Moderate gas and stool within the colon. Vascular/Lymphatic: Aortic atherosclerosis. No enlarged abdominal or pelvic lymph nodes. Reproductive: Uterus and bilateral adnexa are unremarkable. Other: Trace pelvic free fluid. No free intraperitoneal gas. No abdominal wall hernia. Musculoskeletal: No acute or destructive bony abnormalities. Reconstructed images demonstrate  no additional findings. IMPRESSION: 1. Moderate partially loculated left pleural effusion, with compressive atelectasis in the dependent left lower lobe, not appreciably changed since prior study. 2. New trace free-flowing right pleural effusion with minimal right lower lobe atelectasis. 3. Small-bowel obstruction, with dilated proximal small bowel measuring up to 3.6 cm. The exact  point of obstruction is not identified, but the distal jejunum and ileum are decompressed. 4. Moderate retained stool throughout the colon. 5. Stable sequela of chronic calcific pancreatitis and pancreatic parenchymal atrophy. No evidence of acute pancreatitis. 6. Right lower lobe bronchial wall thickening with areas of mucoid impaction, which could be sequela of aspiration or infection. 7. Aortic Atherosclerosis (ICD10-I70.0) and Emphysema (ICD10-J43.9). Electronically Signed   By: Ozell Daring M.D.   On: 03/15/2024 19:04   DG Abd 1 View Result Date: 03/15/2024 CLINICAL DATA:  [id number] Small bowel obstruction (HCC) 881155. EXAM: ABDOMEN - 1 VIEW COMPARISON:  CT scan abdomen and pelvis from 09/04/2023. FINDINGS: There is gaseous dilatation of several small bowel loops overlying the central abdomen, which is disproportionate to the degree of distention of the colon, suggesting small bowel obstruction. If not already performed, CT of the abdomen or small bowel follow through is recommended for further evaluation. No evidence of pneumoperitoneum, within the limitations of a supine film. No acute osseous abnormalities. The soft tissues are within normal limits. Surgical changes, devices, tubes and lines: None. IMPRESSION: *Findings favor small bowel obstruction. If not already performed, CT of the abdomen or small bowel follow through is recommended for further evaluation. Electronically Signed   By: Ree Molt M.D.   On: 03/15/2024 17:00   DG Chest Port 1 View Result Date: 03/15/2024 CLINICAL DATA:  New onset shortness of breath. EXAM: PORTABLE CHEST 1 VIEW COMPARISON:  03/14/2024. FINDINGS: Bilateral lungs appear hyperlucent with coarse bronchovascular markings, in keeping with COPD. Re-demonstration of left retrocardiac airspace opacity obscuring the left hemidiaphragm, descending thoracic aorta and blunting the left lateral costophrenic angle, suggesting combination of left lung atelectasis and/or  consolidation with pleural effusion. No significant interval change. Mild heterogeneous opacities again seen overlying the right mid lung zone, laterally, which may represent underlying pneumonitis versus atelectasis. No interval change. Otherwise, no acute consolidation or lung collapse. Right lateral costophrenic angle is clear. Stable cardio-mediastinal silhouette. No acute osseous abnormalities. The soft tissues are within normal limits. IMPRESSION: *No significant interval change since the prior study. *Persistent left retrocardiac and right mid lung zone opacities, as described above. Electronically Signed   By: Ree Molt M.D.   On: 03/15/2024 16:31    Scheduled Meds:  arformoterol   15 mcg Nebulization BID   aspirin  EC  81 mg Oral Daily   atorvastatin   10 mg Oral Daily   clopidogrel   75 mg Oral Daily   guaiFENesin   600 mg Oral BID   ipratropium-albuterol   3 mL Nebulization TID   losartan   25 mg Oral Daily   methylPREDNISolone  (SOLU-MEDROL ) injection  40 mg Intravenous Q12H   Followed by   NOREEN ON 03/17/2024] methylPREDNISolone  (SOLU-MEDROL ) injection  40 mg Intravenous Q24H   Followed by   NOREEN ON 03/20/2024] predniSONE   30 mg Oral Q breakfast   Followed by   NOREEN ON 03/23/2024] predniSONE   20 mg Oral Q breakfast   Followed by   NOREEN ON 03/26/2024] predniSONE   10 mg Oral Q breakfast   pantoprazole  (PROTONIX ) IV  40 mg Intravenous BID   verapamil   360 mg Oral Daily   Continuous Infusions:  sodium chloride  50  mL/hr at 03/16/24 0218   azithromycin  500 mg (03/16/24 1355)   ceFEPime  (MAXIPIME ) IV 2 g (03/16/24 0912)   tirofiban  0.075 mcg/kg/min (03/16/24 1224)   PRN Meds: albuterol , morphine  injection, ondansetron  **OR** ondansetron  (ZOFRAN ) IV, oxyCODONE , phenol  Time spent: 55 minutes  Author: ELVAN SOR. MD [facility] 03/16/2024 3:51 PM  To reach On-call, see care teams to locate the attending and reach out to them via [email or web address]. If 7PM-7AM, please contact  night-coverage If you still have difficulty reaching the attending provider, please page the [facility] (Director on Call) for [facility] on amion for assistance.

## 2024-03-16 NOTE — Progress Notes (Signed)
 PT Cancellation Note  Patient Details Name: Misty Ferguson MRN: [id number] DOB: 09/22/1933   Cancelled Treatment:    Reason Eval/Treat Not Completed: Other (comment). Pt scheduled for surgery this date, will plan to evaluate next date.   Lakota Markgraf 03/16/2024, 8:52 AM Corean Dade, PT, DPT, [facility] [phone]

## 2024-03-16 NOTE — TOC CM/SW Note (Signed)
 Transition of Care [facility]) - Inpatient Brief Assessment   Patient Details  Name: Misty Ferguson MRN: [id number] Date of Birth: [birth year]  Transition of Care [facility]) CM/SW Contact:    Misty JAYSON Carpen, LCSW Phone Number: 03/16/2024, 4:04 PM   Clinical Narrative: CSW reviewed chart. No TOC needs identified so far. CSW will continue to follow progress. Please place [facility] consult if any needs arise.  Transition of Care Asessment: Insurance and Status: Insurance coverage has been reviewed Patient has primary care physician: Yes Home environment has been reviewed: Single family home Prior level of function:: Not documented Prior/Current Home Services: No current home services Social Drivers of Health Review: SDOH reviewed no interventions necessary Readmission risk has been reviewed: Yes Transition of care needs: no transition of care needs at this time

## 2024-03-16 NOTE — Plan of Care (Signed)
  Problem: Education: Goal: Knowledge of General Education information will improve Description: Including pain rating scale, medication(s)/side effects and non-pharmacologic comfort measures 03/16/2024 1134 by Dodson Geni GRADE, RN Outcome: Progressing 03/16/2024 1134 by Dodson Geni GRADE, RN Outcome: Progressing   Problem: Health Behavior/Discharge Planning: Goal: Ability to manage health-related needs will improve Outcome: Progressing   Problem: Clinical Measurements: Goal: Ability to maintain clinical measurements within normal limits will improve 03/16/2024 1134 by Dodson Geni GRADE, RN Outcome: Progressing 03/16/2024 1134 by Dodson Geni GRADE, RN Outcome: Progressing    Problem: Coping: Goal: Level of anxiety will decrease Outcome: Progressing   Problem: Pain Managment: Goal: General experience of comfort will improve and/or be controlled Outcome: Progressing   Problem: Safety: Goal: Ability to remain free from injury will improve Outcome: Progressing

## 2024-03-16 NOTE — Progress Notes (Signed)
       CROSS COVER NOTE  NAME: Misty Ferguson MRN: [id number] DOB : [birth year]    Concern as stated by nurse / staff   This patient arrieved on 8/2. Admitted with CAP. She also had a fall at home and she has bruising from head to toe, with bleeding. HX Severe COPD (home o2 2) on 4 L, Chronic hypoxia, chronic , Chronic bronchiectasis, Emphysema, Blind, Pleural nodule, HTN, PAD with symptoms predominantly in the left leg , HLD, Prediabetes, bladder CA s/p TURBT 05/22/2019 s/p chemo and xrt,tobacco abuse. Patient had .9 running at 50 for 24 hours. Angiograph today. She already has crackles bilateral, having a difficult time laying flat for 1 minute to be changed. She has 1 unit of blood ordered and is already SOB.      Pertinent findings on chart review: H&P and last progress note reviewed: Admitted for critical limb ischemia, respiratory failure attributed to COPD and CAP. -Review of chest x-ray shows small to moderate pleural effusion -Patient has no history of CHF, BNP was 92 on 03/14/2024  Patient Assessment      03/16/2024    7:49 PM 03/16/2024    3:16 PM 03/16/2024    2:15 PM  Vitals with BMI  Systolic 124 116 864  Diastolic 64 72 71  Pulse 105 92 93     Assessment and  Interventions   Assessment:  Acute dyspnea, some concern for fluid overload Pleural effusion with no history of CHF, receiving fluids and blood  Plan: Stat chest x-ray--> Stable left basilar airspace opacities and small left pleural effusion. 2. Emphysema. Empiric Lasix  40 mg IV x 1 Continue oxygen  Prn duoneb and chest x-ray reassuring-no concern for CHF at this time

## 2024-03-16 NOTE — Op Note (Signed)
 [facility]  Percutaneous Study/Intervention Procedural Note   Date of Surgery: 03/16/2024  Surgeon(s):Buford Bremer    Assistants:none  Pre-operative Diagnosis: PAD with rest pain left lower extremity, acute on chronic ischemia with thrombosis of previous interventions  Post-operative diagnosis:  Same  Procedure(s) Performed:             1.  Ultrasound guidance for vascular access right femoral artery             2.  Catheter placement into left superficial femoral artery and left profunda femoris artery from right femoral approach             3.  Aortogram and selective left lower extremity angiogram including separate profunda femoris angiogram and intervention             4.  Mechanical thrombectomy of the left SFA and popliteal arteries with the [place] Rex device             5.  Angioplasty of the left peroneal artery, tibioperoneal trunk, and distal popliteal artery with 3 mm diameter angioplasty balloon  6.  Stent placement to the left popliteal artery with 5 mm diameter by 5 cm length Viabahn stent  7.  Stent placement x 2 to the left SFA with 6 mm diameter by 15 cm length and 6 mm diameter by 10 cm length Viabahn stent  8.  Angioplasty of the left profunda femoris artery with 4 mm diameter by 4 cm length Lutonix drug-coated angioplasty balloon             9.  StarClose closure device right femoral artery  EBL: 75 cc  Contrast: 70 cc  Fluoro Time: 16.1 minutes  Moderate Conscious Sedation Time: approximately 79 minutes using 2.5 mg of Versed  and 125 mcg of Fentanyl               Indications:  Patient is a 89 y.o.female with profound left lower extremity ischemia with a long history of peripheral arterial disease and thrombosis of her previous interventions with an ischemic left lower extremity.  The patient is brought in for angiography for further evaluation and potential treatment.  Due to the limb threatening nature of the situation, angiogram was performed  for attempted limb salvage. The patient is aware that if the procedure fails, amputation would be expected.  The patient also understands that even with successful revascularization, amputation may still be required due to the severity of the situation.  Risks and benefits are discussed and informed consent is obtained.   Procedure:  The patient was identified and appropriate procedural time out was performed.  The patient was then placed supine on the table and prepped and draped in the usual sterile fashion. Moderate conscious sedation was administered during a face to face encounter with the patient throughout the procedure with my supervision of the RN administering medicines and monitoring the patient's vital signs, pulse oximetry, telemetry and mental status throughout from the start of the procedure until the patient was taken to the recovery room. Ultrasound was used to evaluate the right common femoral artery.  It was patent but significantly diseased.  A digital ultrasound image was acquired.  A Seldinger needle was used to access the right common femoral artery under direct ultrasound guidance and a permanent image was performed.  A 0.035 J wire was advanced without resistance and a 5Fr sheath was placed.  Pigtail catheter was placed into the aorta and an AP aortogram was performed. This demonstrated  normal renal arteries and normal aorta and iliac segments without significant stenosis after previously having stents in the iliac artery. I then crossed the aortic bifurcation and advanced to the left femoral head. Selective left lower extremity angiogram was then performed. This demonstrated a relatively high femoral bifurcation with the catheter actually in the proximal SFA.  The previously treated left common femoral artery had mild residual stenosis in the 20 to 25% range.  The left profunda femoris artery had a 90% stenosis.  The proximal SFA was patent, but then it became diseased with moderate  stenosis above the previous stents and thrombosis of the previous SFA and popliteal stents.  There was only faint distal reconstitution of a small peroneal artery seen distally. It was felt that it was in the patient's best interest to proceed with intervention after these images to avoid a second procedure and a larger amount of contrast and fluoroscopy based off of the findings from the initial angiogram. The patient was systemically heparinized and a 6 [place] sheath was then placed over the [employer] wire. I then used a Kumpe catheter and the advantage wire to get into the SFA but the lesion was initially difficult to cross and I ended up exchanging for a command 18 wire and a seeker catheter.  Using this, I was able to cross the SFA and popliteal occlusion and get down into the peroneal artery.  I then performed mechanical thrombectomy to debulk the thrombosed stents in the SFA and popliteal arteries.  3 passes were made with the [place] Rex device performing mechanical thrombectomy.  This resulted in marked improvement in the thrombus in the stent.  There was a high-grade stenosis associated with some residual thrombus proximal to the stent in the proximal portion of the stent.  The majority of the stent was then patent but the distal stent had a high-grade stenosis at its distal edge with disease that tracked down into the distal popliteal artery and tibioperoneal trunk creating greater than 50% stenosis.  Her vessels were very small.  A 3 mm diameter by 15 cm length angioplasty balloon was then inflated from the proximal peroneal artery up to the tibioperoneal trunk and distal popliteal artery and taken to 10 atm for 1 minute.  Completion imaging showed marked improvement with less than 20% residual stenosis after balloon in the peroneal artery and tibioperoneal trunk as well as the distal popliteal artery.  I then primarily stented the distal edge of the stent as there was some residual thrombus  present.  A 5 mm diameter by 5 cm length Viabahn stent was selected and deployed and postdilated with a 4 mm balloon.  The proximal portion was then treated with a 6 mm diameter by 15 cm length Viabahn stent that was selected and deployed in the left SFA.  There was still some residual disease proximal to this and an additional 6 mm diameter by 10 cm length Viabahn stent was selected and deployed to treat this up to the most proximal SFA.  These were postdilated with 5 mm diameter Lutonix drug-coated balloon with excellent angiographic completion result and now less than 10% residual stenosis in the left SFA and popliteal arteries.  She now had inline flow but given her severe disease and very small vessels, the risk of recurrence was high and I felt that a separate treatment for the profunda femoris artery would help give her better collateral flow if her SFA and popliteal stents reoccluded.  The command wire was  pulled back and then used to cannulate the profunda femoris artery without difficulty.  The Kumpe catheter was used to cross the lesion and selective imaging showed the high-grade stenosis in the proximal profunda femoris artery but the artery beyond the first 2 cm was widely patent.  A 4 mm diameter by 4 cm length Lutonix drug-coated angioplasty balloon was then inflated to 6 atm for 1 minute in the left profunda femoris artery.  Completion imaging showed marked improvement with only about a 20% residual stenosis in the left profunda femoris artery after angioplasty.  I elected to terminate the procedure. The sheath was removed and StarClose closure device was deployed in the right femoral artery with excellent hemostatic result. The patient was taken to the recovery room in stable condition having tolerated the procedure well.  Findings:               Aortogram:  This demonstrated normal renal arteries and normal aorta and iliac segments without significant stenosis after previously having stents in  the iliac artery.             Left Lower Extremity:  This demonstrated a relatively high femoral bifurcation with the catheter actually in the proximal SFA.  The previously treated left common femoral artery had mild residual stenosis in the 20 to 25% range.  The left profunda femoris artery had a 90% stenosis.  The proximal SFA was patent, but then it became diseased with moderate stenosis above the previous stents and thrombosis of the previous SFA and popliteal stents.  There was only faint distal reconstitution of a small peroneal artery seen distally.   Disposition: Patient was taken to the recovery room in stable condition having tolerated the procedure well.  Complications: None  [name] 03/16/2024 11:53 AM   This note was created with Dragon Medical transcription system. Any errors in dictation are purely unintentional.

## 2024-03-16 NOTE — Consult Note (Addendum)
 [facility]-General Surgery  SURGICAL CONSULTATION NOTE    HISTORY OF PRESENT ILLNESS (HPI):  89 y.o. female presented to [facility] ED 03/14/2024 following a fall.  Patient has a history of PAD and has been taking Plavix  and aspirin . States she was having worsening pain and numbness of left foot, likely related to her fall. She had a an appointment scheduled with vascular surgery this upcoming Thursday.  No dopplerable pulse of the left foot and delayed capillary refill. Vascular was consulted. She was started on heparin  drip.  Planning for left lower extremity angiography today depending on her respiratory status.    Patient has a history of severe COPD, chronic bronchiectasis, and chronic hypoxic respiratory failure with 2 L of oxygen  at home. In the ED, she was found to be in respiratory distress with an oxygen  saturation of 85% she had increased her oxygen  at home to 3 L over the past 1 week.  Chest x-ray concerning for possible pneumonia.  She was initially placed on BiPAP but was then transitioned transition to 3 L nasal cannula.    Patient has a history of constipation. She states she had not had a bowel movement for since mid last week and has not passed gas. States she takes laxative at home at times. Was having some non-radiating abdominal pain yesterday. No known alleviating or aggravating factors. States not having bowel movements for several days is normal for her.  Patient denies any nausea or vomiting patient has a prior surgical history of transurethral resection of bladder tumor. No history of abdominal surgery.  Imaging was ordered yesterday. Abdominal x-ray was showed several dilated small bowel loops with distention of colon consistent with small bowel obstruction.  CT of the abdomen findings correlate with small bowel obstruction, no exact transition point was identified but obvious decompression of distal jejunum and ileum.    Surgery is consulted by Dr. Von in this context for  evaluation and management of small bowel obstruction.  Started conservative management yesterday with NG tube and NPO. Ordered Gastrografin  challenge to follow progress with serial imaging  PAST MEDICAL HISTORY (PMH):  Past Medical History:  Diagnosis Date   AK (actinic keratosis) 12/08/2020   right pretibia inferior bx proven, LN2 01/10/21   Burping    Chronic airway obstruction, not elsewhere classified    Dyspnea    Dysrhythmia    Macular degeneration (senile) of retina, unspecified    Malignant neoplasm of urinary bladder (HCC) 03/2019   Partial bladder resection and Rad tx's.    Obstructive chronic bronchitis with exacerbation (HCC)    Osteoporosis, unspecified    Oxygen  deficiency    Personal history of peptic ulcer disease    SCC (squamous cell carcinoma) 12/08/2020   right pretibia superior, EDC 01/10/21   SCC (squamous cell carcinoma) 07/03/2022   left medial calf, clear 09/25/22   SCC (squamous cell carcinoma) 11/22/2022   right thigh   treated with ED&C   Squamous cell carcinoma in situ (SCCIS) 07/03/2022   right pretibia sccis needs treatment with fluorouracil    Tobacco use disorder    Unspecified essential hypertension    Unspecified glaucoma(365.9)      PAST SURGICAL HISTORY (PSH):  Past Surgical History:  Procedure Laterality Date   CATARACT EXTRACTION W/ INTRAOCULAR LENS  IMPLANT, BILATERAL     CYSTOSCOPY WITH STENT PLACEMENT Left 05/22/2019   Procedure: CYSTOSCOPY WITH STENT PLACEMENT;  Surgeon: Francisca Redell BROCKS, MD;  Location: [facility] ORS;  Service: Urology;  Laterality: Left;   ELBOW  FRACTURE SURGERY Left    LOWER EXTREMITY ANGIOGRAPHY Right 11/20/2021   Procedure: Lower Extremity Angiography;  Surgeon: Marea Selinda RAMAN, MD;  Location: [facility] INVASIVE CV LAB;  Service: Cardiovascular;  Laterality: Right;   LOWER EXTREMITY ANGIOGRAPHY Left 09/03/2022   Procedure: Lower Extremity Angiography;  Surgeon: Marea Selinda RAMAN, MD;  Location: [facility] INVASIVE CV LAB;  Service:  Cardiovascular;  Laterality: Left;   LOWER EXTREMITY ANGIOGRAPHY Left 06/03/2023   Procedure: Lower Extremity Angiography;  Surgeon: Marea Selinda RAMAN, MD;  Location: [facility] INV[facility]IVE CV LAB;  Service: Cardiovascular;  Laterality: Left;   TRANSURETHRAL RESECTION OF BLADDER TUMOR N/A 05/22/2019   Procedure: TRANSURETHRAL RESECTION OF BLADDER TUMOR (TURBT);  Surgeon: Francisca Redell BROCKS, MD;  Location: [facility] ORS;  Service: Urology;  Laterality: N/A;     MEDICATIONS:  Prior to Admission medications   Medication Sig Start Date End Date Taking? Authorizing Provider  albuterol  (PROVENTIL ) (2.5 MG/3ML) 0.083% nebulizer solution INHALE THE CONTENTS OF 1 VIAL VIA NEBULIZER EVERY 4 HOURS AS NEEDED FOR WHEEZING OR SHORTNESS OF BREATH. 02/07/24  Yes Isaiah Scrivener, MD  albuterol  (VENTOLIN  HFA) 108 (90 Base) MCG/ACT inhaler Inhale 2 puffs into the lungs every 6 (six) hours as needed for wheezing or shortness of breath. 12/02/23  Yes Kasa, Scrivener, MD  ascorbic acid (VITAMIN C) 1000 MG tablet Take 1,000 mg by mouth daily.    Yes [provider]  aspirin  EC 81 MG tablet Take 1 tablet (81 mg total) by mouth daily. Swallow whole. 06/03/23 06/02/24 Yes Dew, Selinda RAMAN, MD  atorvastatin  (LIPITOR) 10 MG tablet TAKE 1 TABLET EVERY DAY 02/19/24  Yes Dew, Jason S, MD  Azelastine  HCl 137 MCG/SPRAY SOLN Place 2 sprays into both nostrils 2 (two) times daily. 01/20/24  Yes Kasa, Kurian, MD  budesonide -glycopyrrolate -formoterol  (BREZTRI  AEROSPHERE) 160-9-4.8 MCG/ACT AERO inhaler Inhale 2 puffs into the lungs in the morning and at bedtime. 02/10/24  Yes Kasa, Kurian, MD  Cholecalciferol  (VITAMIN D ) 125 MCG (5000 UT) CAPS Take 5,000 Units by mouth daily.   Yes [provider]  clopidogrel  (PLAVIX ) 75 MG tablet TAKE 1 TABLET EVERY DAY 05/01/23  Yes Dew, Jason S, MD  Coenzyme Q10 (COQ10) 200 MG CAPS Take by mouth.   Yes [provider]  docusate sodium  (COLACE) 100 MG capsule Take 100 mg by mouth 2 (two) times daily.   Yes  [provider]  guaiFENesin  (MUCINEX ) 600 MG 12 hr tablet Take 600 mg by mouth 2 (two) times daily.   Yes [provider]  losartan  (COZAAR ) 25 MG tablet 25 mg daily.  08/25/19  Yes [provider]  Multiple Vitamins-Minerals (PRESERVISION AREDS) TABS Take 1 tablet by mouth 2 (two) times daily.   Yes [provider]  verapamil  (VERELAN  PM) 360 MG 24 hr capsule Take 360 mg by mouth daily. 10/20/19  Yes [provider]  Vitamin E 180 MG CAPS Take 180 mg by mouth daily.   Yes [provider]  Acetylcysteine (NAC 600 PO) Take by mouth.    [provider]  budesonide -glycopyrrolate -formoterol  (BREZTRI  AEROSPHERE) 160-9-4.8 MCG/ACT AERO inhaler Inhale 2 puffs into the lungs 2 (two) times daily. 02/24/24   [name], Amy E, MD  Ensifentrine  (OHTUVAYRE ) 3 MG/2.5ML SUSP Inhale 1 Act into the lungs 2 (two) times daily. Patient not taking: Reported on 03/14/2024 12/02/23   Kasa, Kurian, MD  OXYGEN  Inhale 4 L into the lungs daily in the afternoon.    [provider]  [facility] Sprint Nebulizer Set MISC  12/13/23   [provider]     ALLERGIES:  No Known Allergies   SOCIAL HISTORY:  Social History   Socioeconomic History   Marital status: Married    Spouse name: Camellia   Number of children: 3   Years of education: Not on file   Highest education level: Not on file  Occupational History   Occupation: RETIRED    Employer: RETIRED  Tobacco Use   Smoking status: Former    Current packs/day: 0.00    Average packs/day: 1.3 packs/day for 45.0 years (58.5 ttl pk-yrs)    Types: Cigarettes    Start date: 33    Quit date: 1998    Years since quitting: 27.6    Passive exposure: Past   Smokeless tobacco: Never  Vaping Use   Vaping status: Never Used  Substance and Sexual Activity   Alcohol use: Yes    Alcohol/week: 14.0 standard drinks of alcohol    Types: 14 Glasses of wine per week    Comment: 1x/day   Drug use: No   Sexual  activity: Yes    Birth control/protection: Post-menopausal  Other Topics Concern   Not on file  Social History Narrative   Tobacco abuse x 45 years   Lives with husband in a home    Social Drivers of Health   Financial Resource Strain: Low Risk  (01/23/2024)   Overall Financial Resource Strain (CARDIA)    Difficulty of Paying Living Expenses: Not hard at all  Food Insecurity: No Food Insecurity (03/15/2024)   Hunger Vital Sign    Worried About Running Out of Food in the Last Year: Never true    Ran Out of Food in the Last Year: Never true  Transportation Needs: No Transportation Needs (03/15/2024)   PRAPARE - Administrator, Civil Service (Medical): No    Lack of Transportation (Non-Medical): No  Physical Activity: Inactive (01/23/2024)   Exercise Vital Sign    Days of Exercise per Week: 0 days    Minutes of Exercise per Session: 0 min  Stress: No Stress Concern Present (01/23/2024)   [employer] of Occupational Health - Occupational Stress Questionnaire    Feeling of Stress: Not at all  Social Connections: Unknown (03/15/2024)   Social Connection and Isolation Panel    Frequency of Communication with Friends and Family: Not on file    Frequency of Social Gatherings with Friends and Family: Not on file    Attends Religious Services: Not on file    Active Member of Clubs or Organizations: No    Attends Banker Meetings: Not on file    Marital Status: Not on file  Recent Concern: Social Connections - Moderately Isolated (03/15/2024)   Social Connection and Isolation Panel    Frequency of Communication with Friends and Family: Three times a week    Frequency of Social Gatherings with Friends and Family: Never    Attends Religious Services: Never    Database administrator or Organizations: Not on file    Attends Banker Meetings: Never    Marital Status: Married  Catering manager Violence: Not At Risk (03/15/2024)   Humiliation, Afraid, Rape, and  Kick questionnaire    Fear of Current or Ex-Partner: No    Emotionally Abused: No    Physically Abused: No    Sexually Abused: No     FAMILY HISTORY:  Family History  Adopted: Yes      REVIEW OF SYSTEMS:  Review  of Systems  Constitutional:  Negative for chills and fever.  Respiratory:  Negative for cough and wheezing.   Cardiovascular:  Negative for chest pain and palpitations.  Gastrointestinal:  Positive for abdominal pain and constipation. Negative for nausea and vomiting.    VITAL SIGNS:  Temp:  [96.9 F (36.1 C)-98.1 F (36.7 C)] 96.9 F (36.1 C) (08/04 1156) Pulse Rate:  [81-103] 88 (08/04 1215) Resp:  [15-24] 15 (08/04 1215) BP: (118-167)/(46-84) 138/56 (08/04 1215) SpO2:  [95 %-100 %] 99 % (08/04 1215) Weight:  [42.2 kg] 42.2 kg (08/04 0924)     Height: 5' 1 (154.9 cm) Weight: 42.2 kg BMI (Calculated): 17.59   INTAKE/OUTPUT:  08/03 0701 - 08/04 0700 In: 1269.9 [P.O.:480; I.V.:340.6; IV Piggyback:449.2] Out: -   PHYSICAL EXAM:  Physical Exam Constitutional:      Appearance: Normal appearance.  HENT:     Head: Normocephalic.     Comments: Patient has some bruising on face from fall.  Eyes:     Extraocular Movements: Extraocular movements intact.     Pupils: Pupils are equal, round, and reactive to light.  Cardiovascular:     Rate and Rhythm: Normal rate and regular rhythm.  Pulmonary:     Breath sounds: Normal breath sounds.  Abdominal:     General: There is no distension.     Tenderness: There is abdominal tenderness. There is no guarding or rebound.  Neurological:     Mental Status: She is alert.     Labs:     Latest Ref Rng & Units 03/16/2024    6:13 AM 03/15/2024    9:03 AM 03/15/2024    7:14 AM  CBC  WBC 4.0 - 10.5 K/uL 9.8  6.5  5.7   Hemoglobin 12.0 - 15.0 g/dL 7.5  9.1  8.7   Hematocrit 36.0 - 46.0 % 23.5  28.4  26.5   Platelets 150 - 400 K/uL 267  272  255       Latest Ref Rng & Units 03/16/2024    6:13 AM 03/15/2024    9:03 AM 03/14/2024     7:09 AM  CMP  Glucose 70 - 99 mg/dL 878  850  823   BUN 8 - 23 mg/dL 24  22  21    Creatinine 0.44 - 1.00 mg/dL 9.42  9.37  9.35   Sodium 135 - 145 mmol/L 142  140  140   Potassium 3.5 - 5.1 mmol/L 4.4  4.4  4.7   Chloride 98 - 111 mmol/L 106  105  104   CO2 22 - 32 mmol/L 24  26  25    Calcium  8.9 - 10.3 mg/dL 8.7  8.8  9.2   Total Protein 6.5 - 8.1 g/dL   5.9   Total Bilirubin 0.0 - 1.2 mg/dL   0.4   Alkaline Phos 38 - 126 U/L   89   AST 15 - 41 U/L   20   ALT 0 - 44 U/L   19     Imaging studies:   EXAM: 03/15/24 ABDOMEN - 1 VIEW   COMPARISON:  CT scan abdomen and pelvis from 09/04/2023.   FINDINGS: There is gaseous dilatation of several small bowel loops overlying the central abdomen, which is disproportionate to the degree of distention of the colon, suggesting small bowel obstruction. If not already performed, CT of the abdomen or small bowel follow through is recommended for further evaluation.   No evidence of pneumoperitoneum, within the limitations of a supine  film.   No acute osseous abnormalities.   The soft tissues are within normal limits.   Surgical changes, devices, tubes and lines: None.   IMPRESSION: *Findings favor small bowel obstruction. If not already performed, CT of the abdomen or small bowel follow through is recommended for further evaluation.     Electronically Signed   By: Ree Molt M.D.   On: 03/15/2024 17:00    EXAM: 03/15/24 CT CHEST, ABDOMEN, AND PELVIS WITH CONTRAST   TECHNIQUE: Multidetector CT imaging of the chest, abdomen and pelvis was performed following the standard protocol during bolus administration of intravenous contrast.   RADIATION DOSE REDUCTION: This exam was performed according to the departmental dose-optimization program which includes automated exposure control, adjustment of the mA and/or kV according to patient size and/or use of iterative reconstruction technique.   CONTRAST:  80mL OMNIPAQUE   IOHEXOL  300 MG/ML  SOLN   COMPARISON:  09/04/2023, 03/15/2024   FINDINGS: CT CHEST FINDINGS   Cardiovascular: The heart is unremarkable without pericardial effusion. No evidence of thoracic aortic aneurysm or dissection. Atherosclerosis of the aorta and coronary vasculature.   Mediastinum/Nodes: No enlarged mediastinal, hilar, or axillary lymph nodes. Thyroid  gland, trachea, and esophagus demonstrate no significant findings.   Lungs/Pleura: Stable partially loculated left pleural effusion with compressive atelectasis within the left lower lobe. Trace free-flowing right pleural effusion with minimal dependent right lower lobe atelectasis. Upper lobe predominant emphysema. No pneumothorax. Right lower lobe bronchial wall thickening with areas of mucoid impaction.   Musculoskeletal: No acute or destructive bony abnormalities. Reconstructed images demonstrate no additional findings.   CT ABDOMEN PELVIS FINDINGS   Hepatobiliary: No focal liver abnormality is seen. No gallstones, gallbladder wall thickening, or biliary dilatation.   Pancreas: Stable diffuse pancreatic parenchymal atrophy with coarse calcifications compatible with sequela of chronic calcific pancreatitis. Stable cystic dilatation of pancreatic duct. No acute inflammatory changes.   Spleen: Normal in size without focal abnormality.   Adrenals/Urinary Tract: Adrenal glands are unremarkable. Kidneys are normal, without renal calculi, focal lesion, or hydronephrosis. Bladder is unremarkable.   Stomach/Bowel: Dilated proximal small bowel measuring up to 3.6 cm, with numerous gas fluid levels, consistent with small-bowel obstruction. Exact transition point is not identified, but normal caliber distal jejunum and ileum are identified. Moderate gas and stool within the colon.   Vascular/Lymphatic: Aortic atherosclerosis. No enlarged abdominal or pelvic lymph nodes.   Reproductive: Uterus and bilateral adnexa are  unremarkable.   Other: Trace pelvic free fluid. No free intraperitoneal gas. No abdominal wall hernia.   Musculoskeletal: No acute or destructive bony abnormalities. Reconstructed images demonstrate no additional findings.   IMPRESSION: 1. Moderate partially loculated left pleural effusion, with compressive atelectasis in the dependent left lower lobe, not appreciably changed since prior study. 2. New trace free-flowing right pleural effusion with minimal right lower lobe atelectasis. 3. Small-bowel obstruction, with dilated proximal small bowel measuring up to 3.6 cm. The exact point of obstruction is not identified, but the distal jejunum and ileum are decompressed. 4. Moderate retained stool throughout the colon. 5. Stable sequela of chronic calcific pancreatitis and pancreatic parenchymal atrophy. No evidence of acute pancreatitis. 6. Right lower lobe bronchial wall thickening with areas of mucoid impaction, which could be sequela of aspiration or infection. 7. Aortic Atherosclerosis (ICD10-I70.0) and Emphysema (ICD10-J43.9).     Electronically Signed   By: Ozell Daring M.D.   On: 03/15/2024 19:04  CLINICAL DATA:  10 hour delay or small-bowel obstruction.   EXAM: 04/05/24 PORTABLE ABDOMEN - 1  VIEW   COMPARISON:  CT and plain films earlier today.   FINDINGS: Contrast material noted throughout to the colon. Small bowel is decompressed. Calcifications throughout the pancreas. No free air or organomegaly. NG tube in the stomach.   IMPRESSION: Contrast material throughout the colon with decompressed small bowel. No current evidence for bowel obstruction.   Chronic pancreatitis changes.     Electronically Signed   By: Franky Crease M.D.   On: 03/16/2024 12:55   Assessment/Plan: 89 y.o. female with small bowel obstruction, complicated by pertinent comorbidities including PAD with symptoms of left leg, severe COPD, chronic bronchiectasis, chronic hypoxic respiratory  failure with home O2, pleural nodule, HTN, HDL, history of bladder CA and prediabetes.   Small bowel obstruction  - Stable vital signs, no leukocytosis    - Post-gastrografin  abdominal x-ray shows contrast in colon with no evidence of SBO, likely resolving   - Started on clear liquid diet and clamp NGT, plan to advance diet when return of GI function - Continue pain management    - DVT prophylaxis  Hypoxic respiratory failure/pneumonia/COPD exacerbation  -Managed by medicine team   -Currently on 4L [place] (2L of O2 at baseline)   -On oral prednisone  taper  -High risk for pseudomonas, continue cefepime  and azithromycin    -Continue to follow their recommendations   Acute on chronic limb ischemia of left lower leg   - Managed by vascular team   - Had left lower extremity angiography - stent placement x2  - Continue to follow their recommendations  Thank you for the opportunity to participate in this patient's care.   -- Gilmer Cea PA-C

## 2024-03-16 NOTE — Interval H&P Note (Signed)
 History and Physical Interval Note:  03/16/2024 10:07 AM  Misty Ferguson  has presented today for surgery, with the diagnosis of PAD.  The various methods of treatment have been discussed with the patient and family. After consideration of risks, benefits and other options for treatment, the patient has consented to  Procedure(s): Lower Extremity Angiography (Left) as a surgical intervention.  The patient's history has been reviewed, patient examined, no change in status, stable for surgery.  I have reviewed the patient's chart and labs.  Questions were answered to the patient's satisfaction.     Cincere Zorn

## 2024-03-16 NOTE — Progress Notes (Signed)
 Patient bladder scanned for 466 ml. Dr. Cleatus notified. While waiting for response patient urinated 150 ml, message updated t Dr. Cleatus. No new orders, continue current treatment. 1 Unit of blood started. VS stable. Call light within reach.

## 2024-03-17 ENCOUNTER — Encounter: Payer: Self-pay | Admitting: Vascular Surgery

## 2024-03-17 DIAGNOSIS — Z7189 Other specified counseling: Secondary | ICD-10-CM

## 2024-03-17 DIAGNOSIS — I70222 Atherosclerosis of native arteries of extremities with rest pain, left leg: Secondary | ICD-10-CM

## 2024-03-17 LAB — BASIC METABOLIC PANEL WITH GFR
Anion gap: 7 (ref 5–15)
BUN: 28 mg/dL — ABNORMAL HIGH (ref 8–23)
CO2: 26 mmol/L (ref 22–32)
Calcium: 8.1 mg/dL — ABNORMAL LOW (ref 8.9–10.3)
Chloride: 104 mmol/L (ref 98–111)
Creatinine, Ser: 0.85 mg/dL (ref 0.44–1.00)
GFR, Estimated: >60 mL/min (ref >60)
Glucose, Bld: 134 mg/dL — ABNORMAL HIGH (ref 70–99)
Potassium: 4.7 mmol/L (ref 3.5–5.1)
Sodium: 137 mmol/L (ref 135–145)

## 2024-03-17 LAB — PREPARE RBC (CROSSMATCH)

## 2024-03-17 LAB — CBC
HCT: 25.4 % — ABNORMAL LOW (ref 36.0–46.0)
Hemoglobin: 8.1 g/dL — ABNORMAL LOW (ref 12.0–15.0)
MCH: 28.7 pg (ref 26.0–34.0)
MCHC: 31.9 g/dL (ref 30.0–36.0)
MCV: 90.1 fL (ref 80.0–100.0)
Platelets: 218 K/uL (ref 150–400)
RBC: 2.82 MIL/uL — ABNORMAL LOW (ref 3.87–5.11)
RDW: 19.3 % — ABNORMAL HIGH (ref 11.5–15.5)
WBC: 9.2 K/uL (ref 4.0–10.5)
nRBC: 0.5 % — ABNORMAL HIGH (ref 0.0–0.2)

## 2024-03-17 LAB — MAGNESIUM: Magnesium: 2 mg/dL (ref 1.7–2.4)

## 2024-03-17 LAB — PHOSPHORUS: Phosphorus: 3.9 mg/dL (ref 2.5–4.6)

## 2024-03-17 LAB — HEMOGLOBIN AND HEMATOCRIT, BLOOD
HCT: 23 % — ABNORMAL LOW (ref 36.0–46.0)
Hemoglobin: 7.6 g/dL — ABNORMAL LOW (ref 12.0–15.0)

## 2024-03-17 MED ORDER — BISACODYL 5 MG PO TBEC
10.0000 mg | DELAYED_RELEASE_TABLET | Freq: Every day | ORAL | Status: DC
  Administered 2024-03-17: 10 mg via ORAL
  Filled 2024-03-17 (×3): qty 2

## 2024-03-17 MED ORDER — BISACODYL 10 MG RE SUPP
10.0000 mg | Freq: Once | RECTAL | Status: AC
  Administered 2024-03-17: 10 mg via RECTAL
  Filled 2024-03-17: qty 1

## 2024-03-17 MED ORDER — ENOXAPARIN SODIUM 30 MG/0.3ML IJ SOSY
30.0000 mg | PREFILLED_SYRINGE | Freq: Every day | INTRAMUSCULAR | Status: DC
  Administered 2024-03-18 – 2024-03-21 (×4): 30 mg via SUBCUTANEOUS
  Filled 2024-03-17 (×4): qty 0.3

## 2024-03-17 MED ORDER — SODIUM CHLORIDE 0.9 % IV SOLN
500.0000 mg | INTRAVENOUS | Status: AC
  Administered 2024-03-18: 500 mg via INTRAVENOUS
  Filled 2024-03-17: qty 5

## 2024-03-17 MED ORDER — POLYETHYLENE GLYCOL 3350 17 G PO PACK
17.0000 g | PACK | Freq: Every day | ORAL | Status: DC
  Administered 2024-03-17: 17 g via ORAL
  Filled 2024-03-17: qty 1

## 2024-03-17 MED ORDER — AZITHROMYCIN 250 MG PO TABS
500.0000 mg | ORAL_TABLET | Freq: Every day | ORAL | Status: AC
  Administered 2024-03-18: 500 mg via ORAL
  Filled 2024-03-17: qty 2

## 2024-03-17 MED ORDER — POLYETHYLENE GLYCOL 3350 17 G PO PACK
17.0000 g | PACK | Freq: Two times a day (BID) | ORAL | Status: DC
  Administered 2024-03-17: 17 g via ORAL
  Filled 2024-03-17 (×3): qty 1

## 2024-03-17 MED ORDER — IPRATROPIUM-ALBUTEROL 0.5-2.5 (3) MG/3ML IN SOLN
3.0000 mL | Freq: Four times a day (QID) | RESPIRATORY_TRACT | Status: DC
  Administered 2024-03-17 – 2024-03-19 (×5): 3 mL via RESPIRATORY_TRACT
  Filled 2024-03-17 (×6): qty 3

## 2024-03-17 MED ORDER — SODIUM CHLORIDE 0.9 % IV SOLN
1.0000 g | INTRAVENOUS | Status: AC
  Administered 2024-03-17 – 2024-03-18 (×2): 1 g via INTRAVENOUS
  Filled 2024-03-17 (×2): qty 10

## 2024-03-17 MED ORDER — BOOST / RESOURCE BREEZE PO LIQD CUSTOM
1.0000 | Freq: Three times a day (TID) | ORAL | Status: DC
  Administered 2024-03-17 – 2024-03-21 (×9): 1 via ORAL

## 2024-03-17 NOTE — Progress Notes (Signed)
 [facility]- General Surgery  SURGICAL PROGRESS NOTE  Hospital Day(s): 3.   Interval History:  Patient seen and examined. No acute events or new complaints overnight. Overall appears comfortable. States she had a good dinner yesterday, tolerating clear liquid diet. Denies any nausea or vomiting. Denies any abdominal discomfort. States she feels constipated, experienced some flatulence yesterday.  She was receiving a breathing treatment this morning.   Vital signs in last 24 hours: [min-max] current  Temp:  [96.9 F (36.1 C)-98.3 F (36.8 C)] 97.9 F (36.6 C) (08/05 0838) Pulse Rate:  [80-111] 93 (08/05 0838) Resp:  [15-24] 19 (08/05 0439) BP: (114-167)/(46-77) 149/77 (08/05 0838) SpO2:  [94 %-100 %] 96 % (08/05 0838) Weight:  [42.2 kg] 42.2 kg (08/04 0924)     Height: 5' 1 (154.9 cm) Weight: 42.2 kg BMI (Calculated): 17.59   Intake/Output last 2 shifts:  08/04 0701 - 08/05 0700 In: 1296.5 [P.O.:240; I.V.:157.5; Blood:344; NG/GT:5; IV Piggyback:550] Out: 2450 [Urine:2200; Emesis/NG output:250]   Physical Exam:  Constitutional: alert, cooperative and no distress  Respiratory: wheezing b/I, on 4 L [place]  Cardiovascular: regular rate and sinus rhythm  Gastrointestinal: soft, non-tender, and non-distended Integumentary: Bleeding from right arm from fall incident that occurred at home   Labs:     Latest Ref Rng & Units 03/17/2024    4:43 AM 03/16/2024    4:03 PM 03/16/2024    6:13 AM  CBC  WBC 4.0 - 10.5 K/uL 9.2   9.8   Hemoglobin 12.0 - 15.0 g/dL 8.1  7.0  7.5   Hematocrit 36.0 - 46.0 % 25.4  21.3  23.5   Platelets 150 - 400 K/uL 218   267       Latest Ref Rng & Units 03/17/2024    4:43 AM 03/16/2024    6:13 AM 03/15/2024    9:03 AM  CMP  Glucose 70 - 99 mg/dL 865  878  850   BUN 8 - 23 mg/dL 28  24  22    Creatinine 0.44 - 1.00 mg/dL 9.14  9.42  9.37   Sodium 135 - 145 mmol/L 137  142  140   Potassium 3.5 - 5.1 mmol/L 4.7  4.4  4.4   Chloride 98 - 111 mmol/L 104  106  105    CO2 22 - 32 mmol/L 26  24  26    Calcium  8.9 - 10.3 mg/dL 8.1  8.7  8.8     Imaging studies: No new pertinent imaging studies   Assessment/Plan:  89 y.o. female with small bowel obstruction, Day 3, complicated by pertinent comorbidities including PAD with symptoms of left leg, severe COPD, chronic bronchiectasis, chronic hypoxic respiratory failure with home O2, pleural nodule, HTN, HDL, history of bladder CA and prediabetes.   Small bowel obstruction -resolving              - Tolerated clear liquid diet and reported flatulence, will advance to full liquid diet and discontinue NG tube  - Start MiraLAX   - Continue pain management               - DVT prophylaxis   Hypoxic respiratory failure/pneumonia/COPD exacerbation             -Managed by medicine team              -Currently on 4L [place] (2L of O2 at baseline) and IV antibiotics              -Continue to follow their  recommendations    Acute on chronic limb ischemia of left lower leg              - Managed by vascular team              - Had left lower extremity angiography om 03/16/24 - stent placement x2             - Continue to follow their recommendations  -- Kaspar Albornoz Barrientos PA-C

## 2024-03-17 NOTE — Progress Notes (Signed)
 [facility] Progress Note  Patient: Misty Ferguson    FMW:[id number]  DOA: 03/14/2024     Date of Service: the patient was seen and examined on 03/17/2024  Chief Complaint  Patient presents with   Respiratory Distress   Brief hospital course: Misty Ferguson is a 89 y.o. female with medical history significant of Severe COPD  , Chronic bronchiectasis , chronic hypoxic respiratory failure home O2 with exertion and at bedtime ,  Pleural nodule, HTN, PAD with symptoms predominantly in the left leg , HLD, Prediabetes, bladder CA s/p TURBT 05/22/2019 s/p chemo and xrt,tobacco abuse who presents to ED BIB EMS S/p call out for a fall. However on arrival EMS noted patient was in respiratory distress.  She was found sitting on toilet with multiple skin tears noted.  Her O2 sat was 85% . Of note patient is on home O2 with exertion and at bedtime but typically maintains sats well at rest on RA. Patient was treated with albuterol   and transported to ED. Of note if patient has any head trauma. Patient does not complaint of any HA.   ED Course:  Patient was diagnosed with AECOPD/CAP, s/p Bipap, transition to O2 via [place] further management as below.   LLE, no dopplerable pulse.  Seen by Dr. Marea vascular surgery and started heparin  IV infusion. [facility] was consulted for admission and further management as below.   Assessment and Plan:  # Acute on chronic critical limb ischemia left lower extremity Continue Plavix  75 mg p.o. daily S/p Heparin  IV infusion, on 8/4 started Aggrastat  infusion after angiogram as per vascular surgery till 8/5 Vascular surgery consulted s/p angiogram done on 8/4 8/5 continue DAPT, off anticoagulation  # Small bowel obstruction.  Resolved with conservative management 8/3 respiratory status got worse and patient was not feeling right, also having constipation, not passing gas.  Feeling abdominal pressure and abdominal tenderness. KUB positive for small bowel obstruction CT a/p:  Small-bowel obstruction, with dilated proximal small bowel measuring up to 3.6 cm. The exact point of obstruction is not identified, but the distal jejunum and ileum are decompressed. Moderate retained stool throughout the colon. General surgery consulted. 8/4 SBFT: Contrast material throughout the colon with decompressed small bowel. No current evidence for bowel obstruction. 8/4 SBO resolved, NGT clamped, started clear liquid diet as per general surgery. 8/5 NGT removed, FL diet started.  Patient moved bowels.  Dulcolax suppository was given.  Continue laxatives.   # Acute on chronic hypoxic respiratory failure due to pneumonia and COPD exacerbation On 2 L oxygen  at baseline S/p BiPAP in the ED, use BiPAP as needed Continue supplemental O2 admission and gradually wean down to baseline Continue breathing treatment Status post IV Solu-Medrol , continue prednisone  oral 8/3 CT chest: Right lower lobe bronchial wall thickening with areas of mucoid impaction, which could be sequela of aspiration or infection. Moderate partially loculated left pleural effusion, with compressive atelectasis in the dependent left lower lobe, not appreciably changed since prior study. 2. New trace free-flowing right pleural effusion with minimal right lower lobe atelectasis.  # Pleural effusion, left Patient may benefit from thoracentesis. Follow IR for thoracentesis tomorrow a.m. 8/6   # Community-acquired pneumonia Continue cefepime  and azithromycin  x 5 days Mucinex  600 twice daily Pleural effusion on left side most likely due to community-acquired pneumonia CT chest shows moderate left loculated pleural effusion  # Acute blood loss anemia 8/4 Hb 7.0, transfuse 1 unit of PRBC 8/5 Hb 8.1 >> 7.6 Monitor H&H and  transfuse as needed    # Hypertension, HLD Continue losartan  25 twice daily, verapamil  360 mg, Lipitor 10 mg daily Monitor BP and titrate medications accordingly  # Constipation, due to SBO,  now resolved 8/5 s/p Dulcolax suppository.  Continue current laxatives.  # Multiple skin tears right upper and lower extremity 8/5 active bleeding from right hand in between 3rd and 4th fingers, surgical Steri-Strip applied followed by pressure bandage.  RN was advised to place ice pack Continue wound care Continue fall precaution Ambulate with assistance Follow PT OT eval   # Prediabetes -monitor poc   # Hx Bladder CA s/p TURBT 05/22/2019 s/p chemo and xrt -monitor of for hematuria , due to patient history    # Tobacco abuse -in remission    # Pleural nodule -followed by pulmonary Dr Isaiah    Body mass index is 17.58 kg/m.  Interventions:  Diet: CLD >>FLD 8/5 DVT Prophylaxis: s/p Hep gtt and Aggrastat  infusion. Start Lovenox  SQ nightly on 8/6  Advance goals of care discussion: DNR/DNI, Limited  Family Communication: family was present at bedside, at the time of interview.  The pt provided permission to discuss medical plan with the family. Opportunity was given to ask question and all questions were answered satisfactorily.   Disposition:  Pt is from Home, admitted with CAP, COPD, Resp Failure, critical LLE limb ischemia, on IV heparin  infusion and IV antibiotics, which precludes a safe discharge. Discharge to home versus SNF TBD after improvement, when stable, may need few days to improve.  Subjective: No significant events overnight.  Patient was having bleeding to the right hand, status post upright.  Suppository was given, patient moved bowels.  Feeling improvement.  Mild shortness of breath, no any other complaints.   Physical Exam: General: NAD, lying comfortably, mild SOB Appear in mild distress, affect slightly depressed Eyes: PERRLA ENT: Oral Mucosa Clear, moist  Neck: no JVD,  Cardiovascular: S1 and S2 Present, no Murmur,  Respiratory: Equal air entry bilaterally, decreased breath sounds on the left lower side, mild crackles and but no wheezes Abdomen:  Bowel Sound present, Soft and no tenderness,  Skin: Multiple skin tears right arm and right leg, multiple bruises. Dressing intact.  Blood is oozing from the right hand, compression dressing intact Extremities: LLE: Palpable pulse and warm to touch, No Pedal edema, no calf tenderness.  Chronic pigmentation Neurologic: without any new focal findings Gait not checked due to patient safety concerns  Vitals:   03/17/24 0731 03/17/24 0838 03/17/24 1438 03/17/24 1657  BP:  (!) 149/77  (!) 112/58  Pulse:  93  83  Resp:      Temp:  97.9 F (36.6 C)  98.3 F (36.8 C)  TempSrc:      SpO2: 100% 96% 94% 100%  Weight:      Height:        Intake/Output Summary (Last 24 hours) at 03/17/2024 1743 Last data filed at 03/17/2024 1700 Gross per 24 hour  Intake 2586.51 ml  Output 1550 ml  Net 1036.51 ml   Filed Weights   03/14/24 0711 03/14/24 2142 03/16/24 0924  Weight: 40.4 kg 42.2 kg 42.2 kg    Data Reviewed: I have personally reviewed and interpreted daily labs, tele strips, imagings as discussed above. I reviewed all nursing notes, pharmacy notes, vitals, pertinent old records I have discussed plan of care as described above with RN and patient/family.  CBC: Recent Labs  Lab 03/14/24 [phone] 03/15/24 9285 03/15/24 9096 03/16/24 9386 03/16/24 1603  03/17/24 0443 03/17/24 1649  WBC 11.7* 5.7 6.5 9.8  --  9.2  --   HGB 12.9 8.7* 9.1* 7.5* 7.0* 8.1* 7.6*  HCT 40.6 26.5* 28.4* 23.5* 21.3* 25.4* 23.0*  MCV 97.1 95.3 95.9 95.5  --  90.1  --   PLT 390 255 272 267  --  218  --    Basic Metabolic Panel: Recent Labs  Lab 03/14/24 0709 03/15/24 0903 03/16/24 0613 03/17/24 0443  NA 140 140 142 137  K 4.7 4.4 4.4 4.7  CL 104 105 106 104  CO2 25 26 24 26   GLUCOSE 176* 149* 121* 134*  BUN 21 22 24* 28*  CREATININE 0.64 0.62 0.57 0.85  CALCIUM  9.2 8.8* 8.7* 8.1*  MG  --  1.9 2.0 2.0  PHOS  --  3.2 3.5 3.9    Studies: DG Chest Port 1 View Result Date: 03/16/2024 EXAM: 1 VIEW XRAY OF  THE CHEST 03/16/2024 09:32:18 PM COMPARISON: 03/15/2024 CLINICAL HISTORY: Shortness of breath (SOB) FINDINGS: LUNGS AND PLEURA: Left basilar airspace opacities and small left pleural effusion. Heterogeneous opacities in the right mid lung. No pneumothorax. Hyperinflation and chronic bronchitic changes. HEART AND MEDIASTINUM: Stable cardiomediastinal silhouette. Aortic atherosclerotic calcification. BONES AND SOFT TISSUES: No acute osseous abnormality. IMPRESSION: 1. Stable left basilar airspace opacities and small left pleural effusion. 2. Emphysema. Electronically signed by: Norman Gatlin MD 03/16/2024 10:12 PM EDT RP Workstation: [id number]    Scheduled Meds:  arformoterol   15 mcg Nebulization BID   aspirin  EC  81 mg Oral Daily   atorvastatin   10 mg Oral Daily   [START ON 03/18/2024] azithromycin   500 mg Oral Daily   bisacodyl   10 mg Oral QHS   clopidogrel   75 mg Oral Daily   feeding supplement  1 Container Oral TID BM   guaiFENesin   600 mg Oral BID   ipratropium-albuterol   3 mL Nebulization TID   losartan   25 mg Oral Daily   methylPREDNISolone  (SOLU-MEDROL ) injection  40 mg Intravenous Q24H   pantoprazole  (PROTONIX ) IV  40 mg Intravenous BID   polyethylene glycol  17 g Oral BID   verapamil   360 mg Oral Daily   Continuous Infusions:  [START ON 03/18/2024] azithromycin      cefTRIAXone  (ROCEPHIN )  IV 200 mL/hr at 03/17/24 1700   PRN Meds: albuterol , morphine  injection, ondansetron  **OR** ondansetron  (ZOFRAN ) IV, oxyCODONE , phenol  Time spent: 55 minutes  Author: ELVAN SOR. MD [facility] 03/17/2024 5:43 PM  To reach On-call, see care teams to locate the attending and reach out to them via [email or web address]. If 7PM-7AM, please contact night-coverage If you still have difficulty reaching the attending provider, please page the [facility] (Director on Call) for [facility] on amion for assistance.

## 2024-03-17 NOTE — Progress Notes (Signed)
 Patient requested midnight B/P be taken before she went to bed. Patient states she had a very long night yesterday and would like to be able to sleep a couple hours and not be disturbed.

## 2024-03-17 NOTE — Progress Notes (Signed)
 Progress Note    03/17/2024 7:28 AM 1 Day Post-Op  Subjective:  Misty Ferguson is an 89 yo female now POD #1 from:  Procedure(s) Performed:             1.  Ultrasound guidance for vascular access right femoral artery             2.  Catheter placement into left superficial femoral artery and left profunda femoris artery from right femoral approach             3.  Aortogram and selective left lower extremity angiogram including separate profunda femoris angiogram and intervention             4.  Mechanical thrombectomy of the left SFA and popliteal arteries with the R[place] Rex device             5.  Angioplasty of the left peroneal artery, tibioperoneal trunk, and distal popliteal artery with 3 mm diameter angioplasty balloon             6.  Stent placement to the left popliteal artery with 5 mm diameter by 5 cm length Viabahn stent             7.  Stent placement x 2 to the left SFA with 6 mm diameter by 15 cm length and 6 mm diameter by 10 cm length Viabahn stent             8.  Angioplasty of the left profunda femoris artery with 4 mm diameter by 4 cm length Lutonix drug-coated angioplasty balloon             9.  StarClose closure device right femoral artery  Patient is resting comfortably in bed this morning.  She endorses the pain to her left lower extremity has now subsided.  She also endorses that her left lower extremity feels much warmer this morning.  No other complaints overnight.  Vitals are remained stable.   Vitals:   03/17/24 0127 03/17/24 0439  BP: (!) 136/56 (!) 135/50  Pulse: 95 80  Resp: 20 19  Temp: 97.6 F (36.4 C) 97.6 F (36.4 C)  SpO2: 100% 100%   Physical Exam: Cardiac:  RRR, normal S1-S2 no murmurs. Lungs: Lung sounds coarse on auscultation throughout.  Patient history of COPD on 2 L nasal cannula oxygen . Incisions: Right groin incision with dressing clean dry and intact.  No hematoma seroma to note. Extremities: Large skin tear to the lateral aspect of the  right knee bandaged.  Multiple areas of ecchymotic old bruising to bilateral lower extremities.  Also noted skin tear to the right elbow of the right second finger from her fall.  These are all dressed with bandages.  Positive Doppler pulses today to DP and PT her left lower extremity. Abdomen: Positive bowel sounds, nontender nondistended. Neurologic: Alert and oriented x 3, answers all questions and follows commands appropriately.  CBC    Component Value Date/Time   WBC 9.2 03/17/2024 0443   RBC 2.82 (L) 03/17/2024 0443   HGB 8.1 (L) 03/17/2024 0443   HGB 12.4 04/30/2019 1613   HCT 25.4 (L) 03/17/2024 0443   HCT 37.1 04/30/2019 1613   PLT 218 03/17/2024 0443   PLT 295 04/30/2019 1613   MCV 90.1 03/17/2024 0443   MCV 92 04/30/2019 1613   MCH 28.7 03/17/2024 0443   MCHC 31.9 03/17/2024 0443   RDW 19.3 (H) 03/17/2024 0443   RDW 11.5 (L) 04/30/2019 1613  LYMPHSABS 0.2 (L) 07/26/2022 0448   LYMPHSABS 1.4 04/30/2019 1613   MONOABS 0.4 07/26/2022 0448   EOSABS 0.0 07/26/2022 0448   EOSABS 0.2 04/30/2019 1613   BASOSABS 0.0 07/26/2022 0448   BASOSABS 0.1 04/30/2019 1613    BMET    Component Value Date/Time   NA 137 03/17/2024 0443   NA 141 06/10/2019 1324   K 4.7 03/17/2024 0443   K 4.1 07/23/2012 1536   CL 104 03/17/2024 0443   CO2 26 03/17/2024 0443   GLUCOSE 134 (H) 03/17/2024 0443   BUN 28 (H) 03/17/2024 0443   BUN 15 06/10/2019 1324   CREATININE 0.85 03/17/2024 0443   CREATININE 1.26 (H) 12/23/2012 1440   CALCIUM  8.1 (L) 03/17/2024 0443   GFRNONAA >60 03/17/2024 0443   GFRAA 62 06/10/2019 1324    INR    Component Value Date/Time   INR 1.0 03/14/2024 0709     Intake/Output Summary (Last 24 hours) at 03/17/2024 0728 Last data filed at 03/17/2024 9388 Gross per 24 hour  Intake 1296.51 ml  Output 2450 ml  Net -1153.49 ml     Assessment/Plan:  89 y.o. female is s/p SEE ABOVE 1 Day Post-Op   PLAN Patient postoperatively was on Aggrastat  infusion.  That was  stopped at 610 this morning.  Patient will start aspirin  81 mg daily today Lipitor 10 mg daily today and Plavix  75 mg daily today.  Patient will need to be on aspirin  and Plavix  due to stent placement. Continue to monitor left lower extremity ischemia and check pulses. Right groin with dressing clean dry and intact.  No hematoma seroma to note.  DVT prophylaxis: Aspirin  81 mg daily Plavix  75 mg daily and Lipitor 10 mg daily.   Misty Ferguson [facility] 03/17/2024 7:28 AM

## 2024-03-17 NOTE — Care Management Important Message (Signed)
 Important Message  Patient Details  Name: Misty Ferguson MRN: [id number] Date of Birth: 03-03-1934   Important Message Given:  Yes - Medicare IM     [name] 03/17/2024, 12:06 PM

## 2024-03-17 NOTE — Consult Note (Addendum)
 Consultation Note Date: 03/17/2024   Patient Name: Misty Ferguson  DOB: 30-Apr-1934  MRN: [id number]  Age / Sex: 89 y.o., female  PCP: Avelina Greig BRAVO, MD Referring Physician: Von Bellis, MD  Reason for Consultation: Establishing goals of care  HPI/Patient Profile: Per H&P: Misty Ferguson is a 89 y.o. female with medical history significant of Severe COPD  , Chronic bronchiectasis , chronic hypoxic respiratory failure home O2 with exertion and at bedtime ,  Pleural nodule, HTN, PAD with symptoms predominantly in the left leg , HLD, Prediabetes, bladder CA s/p TURBT 05/22/2019 s/p chemo and xrt,tobacco abuse who presents to ED BIB EMS S/p call out for a fall. However on arrival EMS noted patient was in respiratory distress.  She was found sitting on toilet with multiple skin tears noted.  Her O2 sat was 85% . Of note patient is on home O2 with exertion and at bedtime but typically maintains sats well at rest on RA. Patient was treated with albuterol   and transported to ED.  Clinical Assessment and Goals of Care: Notes and labs reviewed.  In to see patient.  She is currently resting in bed with her husband at bedside.  She states she is married with children.  We discussed the events that led to her hospitalization.  We discussed her diagnoses, prognosis, GOC, EOL wishes disposition and options.  Created space and opportunity for patient  to explore thoughts and feelings regarding current medical information.   A detailed discussion was had today regarding advanced directives.  Concepts specific to code status, artifical feeding and hydration, IV antibiotics and rehospitalization were discussed.  The difference between an aggressive medical intervention path and a comfort care path was discussed.  Values and goals of care important to patient and family were attempted to be elicited.  Discussed limitations of  medical interventions to prolong quality of life in some situations and discussed the concept of human mortality.  She states she is a woman of great faith and talks in depth about Jesus, and her faith in Him as does her husband.     She states when the [facility] is ready to take her she is ready to go.  In the meantime she is taking day by day.    SUMMARY OF RECOMMENDATIONS   Ordered I-S and flutter valve Continue current care      Primary Diagnoses: Present on Admission:  CAP (community acquired pneumonia)   I have reviewed the medical record, interviewed the patient and family, and examined the patient. The following aspects are pertinent.  Past Medical History:  Diagnosis Date   AK (actinic keratosis) 12/08/2020   right pretibia inferior bx proven, LN2 01/10/21   Burping    Chronic airway obstruction, not elsewhere classified    Dyspnea    Dysrhythmia    Macular degeneration (senile) of retina, unspecified    Malignant neoplasm of urinary bladder (HCC) 03/2019   Partial bladder resection and Rad tx's.    Obstructive chronic bronchitis with exacerbation (HCC)  Osteoporosis, unspecified    Oxygen  deficiency    Personal history of peptic ulcer disease    SCC (squamous cell carcinoma) 12/08/2020   right pretibia superior, EDC 01/10/21   SCC (squamous cell carcinoma) 07/03/2022   left medial calf, clear 09/25/22   SCC (squamous cell carcinoma) 11/22/2022   right thigh   treated with ED&C   Squamous cell carcinoma in situ (SCCIS) 07/03/2022   right pretibia sccis needs treatment with fluorouracil    Tobacco use disorder    Unspecified essential hypertension    Unspecified glaucoma(365.9)    Social History   Socioeconomic History   Marital status: Married    Spouse name: Camellia   Number of children: 3   Years of education: Not on file   Highest education level: Not on file  Occupational History   Occupation: RETIRED    Employer: RETIRED  Tobacco Use   Smoking status:  Former    Current packs/day: 0.00    Average packs/day: 1.3 packs/day for 45.0 years (58.5 ttl pk-yrs)    Types: Cigarettes    Start date: 79    Quit date: 1998    Years since quitting: 27.6    Passive exposure: Past   Smokeless tobacco: Never  Vaping Use   Vaping status: Never Used  Substance and Sexual Activity   Alcohol use: Yes    Alcohol/week: 14.0 standard drinks of alcohol    Types: 14 Glasses of wine per week    Comment: 1x/day   Drug use: No   Sexual activity: Yes    Birth control/protection: Post-menopausal  Other Topics Concern   Not on file  Social History Narrative   Tobacco abuse x 45 years   Lives with husband in a home    Social Drivers of Health   Financial Resource Strain: Low Risk  (01/23/2024)   Overall Financial Resource Strain (CARDIA)    Difficulty of Paying Living Expenses: Not hard at all  Food Insecurity: No Food Insecurity (03/15/2024)   Hunger Vital Sign    Worried About Running Out of Food in the Last Year: Never true    Ran Out of Food in the Last Year: Never true  Transportation Needs: No Transportation Needs (03/15/2024)   PRAPARE - Administrator, Civil Service (Medical): No    Lack of Transportation (Non-Medical): No  Physical Activity: Inactive (01/23/2024)   Exercise Vital Sign    Days of Exercise per Week: 0 days    Minutes of Exercise per Session: 0 min  Stress: No Stress Concern Present (01/23/2024)   [employer] of Occupational Health - Occupational Stress Questionnaire    Feeling of Stress: Not at all  Social Connections: Unknown (03/15/2024)   Social Connection and Isolation Panel    Frequency of Communication with Friends and Family: Not on file    Frequency of Social Gatherings with Friends and Family: Not on file    Attends Religious Services: Not on file    Active Member of Clubs or Organizations: No    Attends Banker Meetings: Not on file    Marital Status: Not on file  Recent Concern: Social  Connections - Moderately Isolated (03/15/2024)   Social Connection and Isolation Panel    Frequency of Communication with Friends and Family: Three times a week    Frequency of Social Gatherings with Friends and Family: Never    Attends Religious Services: Never    Database administrator or Organizations: Not on file  Attends Banker Meetings: Never    Marital Status: Married   Family History  Adopted: Yes   Scheduled Meds:  arformoterol   15 mcg Nebulization BID   aspirin  EC  81 mg Oral Daily   atorvastatin   10 mg Oral Daily   [START ON 03/18/2024] azithromycin   500 mg Oral Daily   bisacodyl   10 mg Oral QHS   clopidogrel   75 mg Oral Daily   feeding supplement  1 Container Oral TID BM   guaiFENesin   600 mg Oral BID   ipratropium-albuterol   3 mL Nebulization TID   losartan   25 mg Oral Daily   methylPREDNISolone  (SOLU-MEDROL ) injection  40 mg Intravenous Q24H   pantoprazole  (PROTONIX ) IV  40 mg Intravenous BID   polyethylene glycol  17 g Oral BID   verapamil   360 mg Oral Daily   Continuous Infusions:  [START ON 03/18/2024] azithromycin      cefTRIAXone  (ROCEPHIN )  IV 1 g (03/17/24 1216)   PRN Meds:.albuterol , morphine  injection, ondansetron  **OR** ondansetron  (ZOFRAN ) IV, oxyCODONE , phenol Medications Prior to Admission:  Prior to Admission medications   Medication Sig Start Date End Date Taking? Authorizing Provider  albuterol  (PROVENTIL ) (2.5 MG/3ML) 0.083% nebulizer solution INHALE THE CONTENTS OF 1 VIAL VIA NEBULIZER EVERY 4 HOURS AS NEEDED FOR WHEEZING OR SHORTNESS OF BREATH. 02/07/24  Yes Isaiah Scrivener, MD  albuterol  (VENTOLIN  HFA) 108 (90 Base) MCG/ACT inhaler Inhale 2 puffs into the lungs every 6 (six) hours as needed for wheezing or shortness of breath. 12/02/23  Yes Kasa, Scrivener, MD  ascorbic acid (VITAMIN C) 1000 MG tablet Take 1,000 mg by mouth daily.    Yes [provider]  aspirin  EC 81 MG tablet Take 1 tablet (81 mg total) by mouth daily. Swallow  whole. 06/03/23 06/02/24 Yes Dew, Selinda RAMAN, MD  atorvastatin  (LIPITOR) 10 MG tablet TAKE 1 TABLET EVERY DAY 02/19/24  Yes Dew, Jason S, MD  Azelastine  HCl 137 MCG/SPRAY SOLN Place 2 sprays into both nostrils 2 (two) times daily. 01/20/24  Yes Kasa, Kurian, MD  budesonide -glycopyrrolate -formoterol  (BREZTRI  AEROSPHERE) 160-9-4.8 MCG/ACT AERO inhaler Inhale 2 puffs into the lungs in the morning and at bedtime. 02/10/24  Yes Kasa, Kurian, MD  Cholecalciferol  (VITAMIN D ) 125 MCG (5000 UT) CAPS Take 5,000 Units by mouth daily.   Yes [provider]  clopidogrel  (PLAVIX ) 75 MG tablet TAKE 1 TABLET EVERY DAY 05/01/23  Yes Dew, Jason S, MD  Coenzyme Q10 (COQ10) 200 MG CAPS Take by mouth.   Yes [provider]  docusate sodium  (COLACE) 100 MG capsule Take 100 mg by mouth 2 (two) times daily.   Yes [provider]  guaiFENesin  (MUCINEX ) 600 MG 12 hr tablet Take 600 mg by mouth 2 (two) times daily.   Yes [provider]  losartan  (COZAAR ) 25 MG tablet 25 mg daily.  08/25/19  Yes [provider]  Multiple Vitamins-Minerals (PRESERVISION AREDS) TABS Take 1 tablet by mouth 2 (two) times daily.   Yes [provider]  verapamil  (VERELAN  PM) 360 MG 24 hr capsule Take 360 mg by mouth daily. 10/20/19  Yes [provider]  Vitamin E 180 MG CAPS Take 180 mg by mouth daily.   Yes [provider]  Acetylcysteine (NAC 600 PO) Take by mouth.    [provider]  budesonide -glycopyrrolate -formoterol  (BREZTRI  AEROSPHERE) 160-9-4.8 MCG/ACT AERO inhaler Inhale 2 puffs into the lungs 2 (two) times daily. 02/24/24   [name], Amy E, MD  Ensifentrine  (OHTUVAYRE ) 3 MG/2.5ML SUSP Inhale 1  Act into the lungs 2 (two) times daily. Patient not taking: Reported on 03/14/2024 12/02/23   Kasa, Kurian, MD  OXYGEN  Inhale 4 L into the lungs daily in the afternoon.    [provider]  Jim DEAR Sprint Nebulizer Set MISC  12/13/23   [provider]   No Known  Allergies Review of Systems  All other systems reviewed and are negative.   Physical Exam Pulmonary:     Effort: Pulmonary effort is normal.  Skin:    General: Skin is warm and dry.     Comments: Bandaging to right arm and left hand  Neurological:     Mental Status: She is alert.     Vital Signs: BP (!) 149/77 (BP Location: Left Arm)   Pulse 93   Temp 97.9 F (36.6 C)   Resp 19   Ht 5' 1 (1.549 m)   Wt 42.2 kg   SpO2 94%   BMI 17.58 kg/m  Pain Scale: 0-10 POSS *See Group Information*: S-Acceptable,Sleep, easy to arouse Pain Score: 6    SpO2: SpO2: 94 % O2 Device:SpO2: 94 % O2 Flow Rate: .O2 Flow Rate (L/min): 4 L/min  IO: Intake/output summary:  Intake/Output Summary (Last 24 hours) at 03/17/2024 1528 Last data filed at 03/17/2024 1300 Gross per 24 hour  Intake 2736.51 ml  Output 1550 ml  Net 1186.51 ml    LBM: Last BM Date : 03/15/24 Baseline Weight: Weight: 40.4 kg Most recent weight: Weight: 42.2 kg        Signed by: Camelia Lewis, NP   Please contact Palliative Medicine Team phone at [phone] for questions and concerns.  For individual provider: See Tracey

## 2024-03-17 NOTE — Plan of Care (Signed)
?  Problem: Education: ?Goal: Knowledge of General Education information will improve ?Description: Including pain rating scale, medication(s)/side effects and non-pharmacologic comfort measures ?Outcome: Progressing ?  ?Problem: Health Behavior/Discharge Planning: ?Goal: Ability to manage health-related needs will improve ?Outcome: Progressing ?  ?Problem: Clinical Measurements: ?Goal: Ability to maintain clinical measurements within normal limits will improve ?Outcome: Progressing ?  ?Problem: Clinical Measurements: ?Goal: Respiratory complications will improve ?Outcome: Progressing ?  ?Problem: Clinical Measurements: ?Goal: Cardiovascular complication will be avoided ?Outcome: Progressing ?  ?

## 2024-03-17 NOTE — Evaluation (Signed)
 Occupational Therapy Evaluation Patient Details Name: Misty Ferguson MRN: [id number] DOB: 09-29-1933 Today's Date: 03/17/2024   History of Present Illness   Patient is a 89 year old female with respiratory distress, Acute on chronic critical limb ischemia left lower extremity s/p angiogram. Small bowel obstruction. PMH: severe COPD, bronchiectasis, acute respiratory failure with home 02    Clinical Impressions [name] was seen for OT evaluation this date. Prior to hospital admission, pt required assist for ADLs and mobility from spouse, reports primarily using a transport chair. Pt currently requires MOD A bed mobility, initial MIN A static sitting improves to SUPERVISION. Tolerates >10 mins seated grooming tasks, requires MAX A for all tasks as pt unable to grip 2/2 B hand wrappings from wounds. MIN A x2 + HHA sit<>stand and side steps along EOB. Pt would benefit from skilled OT to address noted impairments and functional limitations (see below for any additional details). Upon hospital discharge, recommend OT follow up.    If plan is discharge home, recommend the following:   Help with stairs or ramp for entrance;A little help with bathing/dressing/bathroom;A little help with walking and/or transfers     Functional Status Assessment   Patient has had a recent decline in their functional status and demonstrates the ability to make significant improvements in function in a reasonable and predictable amount of time.     Equipment Recommendations   BSC/3in1     Recommendations for Other Services         Precautions/Restrictions   Precautions Precautions: Fall Recall of Precautions/Restrictions: Intact Restrictions Weight Bearing Restrictions Per Provider Order: No     Mobility Bed Mobility Overal bed mobility: Needs Assistance Bed Mobility: Supine to Sit, Sit to Supine     Supine to sit: Mod assist Sit to supine: Mod assist        Transfers Overall transfer  level: Needs assistance Equipment used: 2 person hand held assist Transfers: Sit to/from Stand Sit to Stand: Min assist, +2 physical assistance                  Balance Overall balance assessment: Needs assistance Sitting-balance support: Feet supported Sitting balance-Leahy Scale: Fair     Standing balance support: Bilateral upper extremity supported Standing balance-Leahy Scale: Poor                             ADL either performed or assessed with clinical judgement   ADL Overall ADL's : Needs assistance/impaired                                       General ADL Comments: MAX A don B socks in sitting. MIN A x2 + HHA simulated BSC t/f     Vision         Perception         Praxis         Pertinent Vitals/Pain Pain Assessment Pain Assessment: Faces Faces Pain Scale: Hurts even more Pain Location: left leg Pain Descriptors / Indicators: Discomfort Pain Intervention(s): Premedicated before session, Repositioned     Extremity/Trunk Assessment Upper Extremity Assessment Upper Extremity Assessment: RUE deficits/detail;LUE deficits/detail RUE Deficits / Details: wrapped 2/2 wounds, unable to utilize grip LUE Deficits / Details: wrapped 2/2 wounds, unable to utilize grip   Lower Extremity Assessment Lower Extremity Assessment: Generalized weakness  Communication Communication Communication: Impaired Factors Affecting Communication: Hearing impaired   Cognition Arousal: Alert Behavior During Therapy: WFL for tasks assessed/performed, Anxious Cognition: No apparent impairments                               Following commands: Intact       Cueing  General Comments   Cueing Techniques: Verbal cues  improving anxiety with mobility. dyspnea with exertion. Sp02 96% on 4 L02. occasional reinforcement of pursed lip breathing techniques   Exercises     Shoulder Instructions      Home Living  Family/patient expects to be discharged to:: Private residence Living Arrangements: Spouse/significant other Available Help at Discharge: Family Type of Home: House Home Access: Stairs to enter Secretary/administrator of Steps: 1   Home Layout: One level     Bathroom Shower/Tub: Walk-in shower         Home Equipment: Agricultural consultant (2 wheels);Cane - single point;BSC/3in1;Transport chair          Prior Functioning/Environment Prior Level of Function : Needs assist             Mobility Comments: limited walking with rolling walker. uses mostly the transport chair and spouse assists. spouse also uses the transport chair to get down the 1 step out of the house ADLs Comments: spouse assists with most all ADLs, cooking, cleaning, etc.    OT Problem List: Decreased strength;Decreased range of motion;Decreased activity tolerance;Impaired balance (sitting and/or standing);Decreased cognition   OT Treatment/Interventions: Self-care/ADL training;Therapeutic exercise;DME and/or AE instruction;Therapeutic activities;Patient/family education      OT Goals(Current goals can be found in the care plan section)   Acute Rehab OT Goals Patient Stated Goal: to go home OT Goal Formulation: With patient/family Time For Goal Achievement: 03/31/24 Potential to Achieve Goals: Fair ADL Goals Pt Will Perform Grooming: with modified independence;sitting Pt Will Perform Lower Body Dressing: with modified independence;sit to/from stand Pt Will Transfer to Toilet: with modified independence;ambulating;regular height toilet   OT Frequency:  Min 2X/week    Co-evaluation PT/OT/SLP Co-Evaluation/Treatment: Yes Reason for Co-Treatment: Complexity of the patient's impairments (multi-system involvement);To address functional/ADL transfers PT goals addressed during session: Mobility/safety with mobility OT goals addressed during session: ADL's and self-care      AM-PAC OT 6 Clicks Daily  Activity     Outcome Measure Help from another person eating meals?: A Lot Help from another person taking care of personal grooming?: A Lot Help from another person toileting, which includes using toliet, bedpan, or urinal?: A Lot Help from another person bathing (including washing, rinsing, drying)?: A Lot Help from another person to put on and taking off regular upper body clothing?: A Lot Help from another person to put on and taking off regular lower body clothing?: A Lot 6 Click Score: 12   End of Session Equipment Utilized During Treatment: Oxygen   Activity Tolerance: Patient tolerated treatment well Patient left: in bed;with call bell/phone within reach;with bed alarm set;with family/visitor present  OT Visit Diagnosis: Muscle weakness (generalized) (M62.81);Other abnormalities of gait and mobility (R26.89)                Time: 8980-8943 OT Time Calculation (min): 37 min Charges:  OT General Charges $OT Visit: 1 Visit OT Evaluation $OT Eval Moderate Complexity: 1 Mod OT Treatments $Self Care/Home Management : 8-22 mins  Elston Slot, M.S. OTR/L  03/17/24, 1:27 PM  ascom [phone]

## 2024-03-17 NOTE — Evaluation (Signed)
 Physical Therapy Evaluation Patient Details Name: Misty Ferguson MRN: [id number] DOB: 1934-08-08 Today's Date: 03/17/2024  History of Present Illness  Patient is a 89 year old female with respiratory distress, Acute on chronic critical limb ischemia left lower extremity s/p angiogram. Small bowel obstruction. PMH: severe COPD, bronchiectasis, acute respiratory failure with home 02  Clinical Impression  Patient is agreeable to PT evaluation with encouragement. Patient with limited walking at baseline and spouse assists with mobility and ADLs at home. Patient uses a transport chair as primary means of mobility at baseline.  Today the patient was able to sit up, stand, and take a few side steps with Min A + 2 person. Dyspnea with exertion with Sp02 96% on 4 L02. The patient is hopeful for return home with continued caregiver support from spouse. Recommend to continue PT to maximize independence and to decrease caregiver burden.     If plan is discharge home, recommend the following: A little help with walking and/or transfers;A little help with bathing/dressing/bathroom;Help with stairs or ramp for entrance;Assistance with cooking/housework   Can travel by private vehicle        Equipment Recommendations None recommended by PT  Recommendations for Other Services       Functional Status Assessment Patient has had a recent decline in their functional status and demonstrates the ability to make significant improvements in function in a reasonable and predictable amount of time.     Precautions / Restrictions Precautions Precautions: Fall Recall of Precautions/Restrictions: Intact Restrictions Weight Bearing Restrictions Per Provider Order: No      Mobility  Bed Mobility Overal bed mobility: Needs Assistance Bed Mobility: Supine to Sit, Sit to Supine     Supine to sit: Min assist, +2 for physical assistance Sit to supine: Min assist, +2 for physical assistance   General bed  mobility comments: assistance for trunk and BLE support. cues for technique    Transfers Overall transfer level: Needs assistance Equipment used: 2 person hand held assist Transfers: Sit to/from Stand Sit to Stand: Min assist, +2 physical assistance           General transfer comment: cues for anterior weight shifting    Ambulation/Gait             Pre-gait activities: patient able to take 2-3 small side steps to the left with + 2 person assistance. standing activity tolerance limited by fatigue    Stairs            Wheelchair Mobility     Tilt Bed    Modified Rankin (Stroke Patients Only)       Balance Overall balance assessment: Needs assistance Sitting-balance support: Feet supported Sitting balance-Leahy Scale: Fair     Standing balance support: Bilateral upper extremity supported Standing balance-Leahy Scale: Poor Standing balance comment: external support required                             Pertinent Vitals/Pain Pain Assessment Pain Assessment: Faces Faces Pain Scale: Hurts even more Pain Location: left leg Pain Descriptors / Indicators: Discomfort Pain Intervention(s): Limited activity within patient's tolerance, Monitored during session, Repositioned    Home Living Family/patient expects to be discharged to:: Private residence Living Arrangements: Spouse/significant other Available Help at Discharge: Family Type of Home: House Home Access: Stairs to enter   Secretary/administrator of Steps: 1   Home Layout: One level Home Equipment: Agricultural consultant (2 wheels);Cane - single point;BSC/3in1;Transport chair  Prior Function Prior Level of Function : Needs assist             Mobility Comments: limited walking with rolling walker. uses mostly the transport chair and spouse assists. spouse also uses the transport chair to get down the 1 step out of the house ADLs Comments: spouse assists with most all ADLs, cooking,  cleaning, etc.     Extremity/Trunk Assessment   Upper Extremity Assessment Upper Extremity Assessment: Defer to OT evaluation    Lower Extremity Assessment Lower Extremity Assessment: Generalized weakness       Communication   Communication Communication: Impaired Factors Affecting Communication: Hearing impaired    Cognition Arousal: Alert Behavior During Therapy: Anxious   PT - Cognitive impairments: No apparent impairments                         Following commands: Intact       Cueing Cueing Techniques: Verbal cues     General Comments General comments (skin integrity, edema, etc.): improving anxiety with mobility. dyspnea with exertion. Sp02 96% on 4 L02. occasional reinforcement of pursed lip breathing techniques    Exercises     Assessment/Plan    PT Assessment Patient needs continued PT services  PT Problem List Decreased strength;Decreased balance;Decreased activity tolerance;Decreased range of motion;Decreased mobility;Cardiopulmonary status limiting activity;Decreased safety awareness       PT Treatment Interventions Gait training;DME instruction;Functional mobility training;Therapeutic exercise;Therapeutic activities;Balance training;Cognitive remediation;Neuromuscular re-education;Patient/family education;Wheelchair mobility training    PT Goals (Current goals can be found in the Care Plan section)  Acute Rehab PT Goals Patient Stated Goal: home PT Goal Formulation: With patient/family Time For Goal Achievement: 03/31/24 Potential to Achieve Goals: Fair Additional Goals Additional Goal #1: patient will increase standing tolerance to 3 minutes with CGA using device as needed in preparation for standing ADLs    Frequency Min 2X/week     Co-evaluation PT/OT/SLP Co-Evaluation/Treatment: Yes Reason for Co-Treatment: Complexity of the patient's impairments (multi-system involvement);To address functional/ADL transfers PT goals addressed  during session: Mobility/safety with mobility         AM-PAC PT 6 Clicks Mobility  Outcome Measure Help needed turning from your back to your side while in a flat bed without using bedrails?: A Little Help needed moving from lying on your back to sitting on the side of a flat bed without using bedrails?: A Little Help needed moving to and from a bed to a chair (including a wheelchair)?: A Lot Help needed standing up from a chair using your arms (e.g., wheelchair or bedside chair)?: A Lot Help needed to walk in hospital room?: A Lot Help needed climbing 3-5 steps with a railing? : A Lot 6 Click Score: 14    End of Session   Activity Tolerance: Patient tolerated treatment well;Patient limited by fatigue Patient left: in bed;with call bell/phone within reach;with bed alarm set Nurse Communication: Mobility status PT Visit Diagnosis: Muscle weakness (generalized) (M62.81);Unsteadiness on feet (R26.81)    Time: 8980-8943 PT Time Calculation (min) (ACUTE ONLY): 37 min   Charges:   PT Evaluation $PT Eval Moderate Complexity: 1 Mod PT Treatments $Therapeutic Activity: 8-22 mins PT General Charges $$ ACUTE PT VISIT: 1 Visit         Randine Essex, PT, MPT   Randine LULLA Essex 03/17/2024, 12:58 PM

## 2024-03-17 NOTE — Plan of Care (Signed)
  Problem: Education: Goal: Knowledge of General Education information will improve Description: Including pain rating scale, medication(s)/side effects and non-pharmacologic comfort measures Outcome: Progressing   Problem: Nutrition: Goal: Adequate nutrition will be maintained Outcome: Progressing   Problem: Elimination: Goal: Will not experience complications related to bowel motility Outcome: Progressing Goal: Will not experience complications related to urinary retention Outcome: Progressing   Problem: Safety: Goal: Ability to remain free from injury will improve Outcome: Progressing   Problem: Education: Goal: Knowledge of disease or condition will improve Outcome: Progressing   Problem: Health Behavior/Discharge Planning: Goal: Ability to manage health-related needs will improve Outcome: Not Progressing   Problem: Clinical Measurements: Goal: Respiratory complications will improve Outcome: Not Progressing   Problem: Activity: Goal: Risk for activity intolerance will decrease Outcome: Not Progressing   Problem: Skin Integrity: Goal: Risk for impaired skin integrity will decrease Outcome: Not Progressing

## 2024-03-18 ENCOUNTER — Inpatient Hospital Stay

## 2024-03-18 DIAGNOSIS — Z7189 Other specified counseling: Secondary | ICD-10-CM | POA: Diagnosis not present

## 2024-03-18 DIAGNOSIS — I70222 Atherosclerosis of native arteries of extremities with rest pain, left leg: Secondary | ICD-10-CM | POA: Diagnosis not present

## 2024-03-18 LAB — CBC
HCT: 24.6 % — ABNORMAL LOW (ref 36.0–46.0)
Hemoglobin: 8 g/dL — ABNORMAL LOW (ref 12.0–15.0)
MCH: 29.1 pg (ref 26.0–34.0)
MCHC: 32.5 g/dL (ref 30.0–36.0)
MCV: 89.5 fL (ref 80.0–100.0)
Platelets: 203 K/uL (ref 150–400)
RBC: 2.75 MIL/uL — ABNORMAL LOW (ref 3.87–5.11)
RDW: 19 % — ABNORMAL HIGH (ref 11.5–15.5)
WBC: 10.2 K/uL (ref 4.0–10.5)
nRBC: 0.3 % — ABNORMAL HIGH (ref 0.0–0.2)

## 2024-03-18 LAB — BASIC METABOLIC PANEL WITH GFR
Anion gap: 6 (ref 5–15)
Anion gap: 7 (ref 5–15)
BUN: 33 mg/dL — ABNORMAL HIGH (ref 8–23)
BUN: 36 mg/dL — ABNORMAL HIGH (ref 8–23)
CO2: 27 mmol/L (ref 22–32)
CO2: 28 mmol/L (ref 22–32)
Calcium: 8.5 mg/dL — ABNORMAL LOW (ref 8.9–10.3)
Calcium: 8.8 mg/dL — ABNORMAL LOW (ref 8.9–10.3)
Chloride: 105 mmol/L (ref 98–111)
Chloride: 104 mmol/L (ref 98–111)
Creatinine, Ser: 0.95 mg/dL (ref 0.44–1.00)
Creatinine, Ser: 1.07 mg/dL — ABNORMAL HIGH (ref 0.44–1.00)
GFR, Estimated: 58 mL/min — ABNORMAL LOW (ref >60)
GFR, Estimated: 50 mL/min — ABNORMAL LOW (ref >60)
Glucose, Bld: 121 mg/dL — ABNORMAL HIGH (ref 70–99)
Glucose, Bld: 137 mg/dL — ABNORMAL HIGH (ref 70–99)
Potassium: 5.6 mmol/L — ABNORMAL HIGH (ref 3.5–5.1)
Potassium: 5.4 mmol/L — ABNORMAL HIGH (ref 3.5–5.1)
Sodium: 138 mmol/L (ref 135–145)
Sodium: 139 mmol/L (ref 135–145)

## 2024-03-18 LAB — PROTEIN, PLEURAL OR PERITONEAL FLUID: Total protein, fluid: <3 g/dL

## 2024-03-18 LAB — BODY FLUID CELL COUNT WITH DIFFERENTIAL
Eos, Fluid: 0 %
Lymphs, Fluid: 49 %
Monocyte-Macrophage-Serous Fluid: 13 %
Neutrophil Count, Fluid: 38 %
Total Nucleated Cell Count, Fluid: 500 uL

## 2024-03-18 LAB — PHOSPHORUS: Phosphorus: 3 mg/dL (ref 2.5–4.6)

## 2024-03-18 LAB — MAGNESIUM: Magnesium: 2.3 mg/dL (ref 1.7–2.4)

## 2024-03-18 LAB — LACTATE DEHYDROGENASE, PLEURAL OR PERITONEAL FLUID: LD, Fluid: 47 U/L — ABNORMAL HIGH (ref 3–23)

## 2024-03-18 MED ORDER — SODIUM ZIRCONIUM CYCLOSILICATE 10 G PO PACK
10.0000 g | PACK | Freq: Once | ORAL | Status: DC
  Filled 2024-03-18: qty 1

## 2024-03-18 MED ORDER — LIDOCAINE HCL (PF) 1 % IJ SOLN
10.0000 mL | Freq: Once | INTRAMUSCULAR | Status: AC
  Administered 2024-03-18: 10 mL
  Filled 2024-03-18: qty 10

## 2024-03-18 NOTE — TOC CM/SW Note (Signed)
 CSW is following for home health and DME. CSW went by the room a few times to talk with patient and husband. She was with the nurse twice and then palliative. Will try again later.  Lauraine Carpen, CSW [phone]

## 2024-03-18 NOTE — Progress Notes (Signed)
 Rechecked patient 30 minutes after oxycodone   5 mg PRN given. Patient report no change and still has considerable pain. Patient requests her morphine  2 mg PRN. Will recheck patient in 30 minutes.

## 2024-03-18 NOTE — TOC Initial Note (Signed)
 Transition of Care [facility]) - Initial/Assessment Note    Patient Details  Name: Misty Ferguson MRN: [id number] Date of Birth: January 17, 1934  [facility] of [facility]) CM/SW Contact:    Lauraine JAYSON Carpen, LCSW Phone Number: 03/18/2024, 2:25 PM  Clinical Narrative: CSW met with patient. Husband at bedside. CSW introduced role and explained that therapy notes would be discussed. Patient and her husband are agreeable to home health. They reviewed CMS scores and first preference is [facility]. They have accepted referral for PT, OT, RN. They will need wound care instructions listed in the home health order. Patient confirmed having home oxygen . She requires 2-4 L. Patient bought her own oxygen  and it is supplied through [facility] in [place]. Patient and husband declined 3-in-1. She already has an over-the-toilet seat and shower seat. No further concerns. CSW will continue to follow patient and her husband for support and facilitate return home once stable. Husband will transport her home at discharge and bring her oxygen  for the ride.                 Expected Discharge Plan: Home w Home Health Services Barriers to Discharge: Continued Medical Work up   Patient Goals and CMS Choice            Expected Discharge Plan and Services     Post Acute Care Choice: Home Health Living arrangements for the past 2 months: Single Family Home                           HH Arranged: PT, OT, RN [facility] Agency: [facility] Date [facility] Agency Contacted: 03/18/24   Representative spoke with at [facility] Agency: Larraine  Prior Living Arrangements/Services Living arrangements for the past 2 months: Single Family Home Lives with:: Spouse Patient language and need for interpreter reviewed:: Yes Do you feel safe going back to the place where you live?: Yes      Need for Family Participation in Patient Care: Yes (Comment) Care giver support system in place?: Yes (comment) Current home services:  DME Criminal Activity/Legal Involvement Pertinent to Current Situation/Hospitalization: No - Comment as needed  Activities of Daily Living   ADL Screening (condition at time of admission) Independently performs ADLs?: No Does the patient have a NEW difficulty with bathing/dressing/toileting/self-feeding that is expected to last >3 days?: No Does the patient have a NEW difficulty with getting in/out of bed, walking, or climbing stairs that is expected to last >3 days?: No Does the patient have a NEW difficulty with communication that is expected to last >3 days?: No Is the patient deaf or have difficulty hearing?: No Does the patient have difficulty seeing, even when wearing glasses/contacts?: Yes Does the patient have difficulty concentrating, remembering, or making decisions?: No  Permission Sought/Granted Permission sought to share information with : Facility Medical sales representative, Family Supports Permission granted to share information with : Yes, Verbal Permission Granted  Share Information with NAME: Santoria Chason  Permission granted to share info w AGENCY: [facility]  Permission granted to share info w Relationship: Husband  Permission granted to share info w Contact Information: [phone]  Emotional Assessment Appearance:: Appears stated age Attitude/Demeanor/Rapport: Engaged, Gracious Affect (typically observed): Accepting, Appropriate, Calm, Pleasant Orientation: : Oriented to Self, Oriented to Place, Oriented to  Time, Oriented to Situation Alcohol / Substance Use: Not Applicable Psych Involvement: No (comment)  Admission diagnosis:  Pleural  effusion [J90] COPD exacerbation (HCC) [J44.1] CAP (community acquired pneumonia) [J18.9] Community acquired pneumonia of right lung, unspecified part of lung [J18.9] Patient Active Problem List   Diagnosis Date Noted   Critical limb ischemia of left lower extremity (HCC) 03/17/2024   CAP (community acquired  pneumonia) 03/14/2024   Atherosclerosis of native arteries of extremity with intermittent claudication (HCC) 05/28/2023   Multiple skin tears 02/08/2023   Chronic toe pain, bilateral 11/20/2022   DNR (do not resuscitate) 11/20/2022   Acute bacterial bronchitis 07/26/2022   Protein-calorie malnutrition, severe (HCC) 07/26/2022   Acute on chronic respiratory failure with hypoxia (HCC) 07/25/2022   Acute cough 01/23/2022   PVD (peripheral vascular disease) (HCC) 12/14/2021   Decreased pulses in feet 11/06/2021   Goals of care, counseling/discussion 06/14/2019   History of bladder cancer 06/08/2019   Senile purpura (HCC) 10/04/2016   Peripheral neuropathy 07/24/2016   Chronic hypoxemic respiratory failure (HCC) 08/25/2012   HYPERCHOLESTEROLEMIA 03/24/2008   Macular degeneration (senile) of retina 12/10/2006   Essential hypertension, benign 12/10/2006   Centrilobular emphysema (HCC) 12/10/2006   Osteoporosis 12/10/2006   PCP:  Avelina Greig BRAVO, MD Pharmacy:   [facility] [phone] [address], [place] - [address] [place] [address] Phone: [phone] Fax: [phone]  [facility] - [place], [place] - [address] [place] [address] Phone: [phone] Fax: [phone]  [facility] [place], [place] - [phone] [place] [address] Phone: [phone] Fax: [phone]  [facility] - [place], [place] - [address] [place] [address] Phone: [phone] Fax: [phone]     Social Drivers of Health (SDOH) Social History: SDOH Screenings   Food Insecurity: No Food Insecurity (03/15/2024)  Housing: Low Risk  (03/15/2024)  Transportation Needs: No Transportation Needs (03/15/2024)  Utilities: Not At Risk (03/15/2024)  Alcohol Screen: Low Risk  (01/23/2024)  Depression (PHQ2-9): Low Risk  (01/23/2024)  Financial Resource Strain: Low Risk  (01/23/2024)   Physical Activity: Inactive (01/23/2024)  Social Connections: Unknown (03/15/2024)  Recent Concern: Social Connections - Moderately Isolated (03/15/2024)  Stress: No Stress Concern Present (01/23/2024)  Tobacco Use: Medium Risk (03/14/2024)  Health Literacy: Adequate Health Literacy (01/23/2024)   SDOH Interventions:     Readmission Risk Interventions     No data to display

## 2024-03-18 NOTE — Progress Notes (Signed)
 [facility]- General Surgery  SURGICAL PROGRESS NOTE  Hospital Day(s): 4.    Interval History:  Patient seen and examined. States she has been tolerating full liquid diet.  Denies any nausea or vomiting.  States she has had a few large bowel movements, and feels like she has not done.Denies any abdominal discomfort. Feeling well overall.    Vital signs in last 24 hours: [min-max] current  Temp:  [97.5 F (36.4 C)-98.3 F (36.8 C)] 97.5 F (36.4 C) (08/06 0715) Pulse Rate:  [77-97] 97 (08/06 0715) Resp:  [19-20] 20 (08/06 0715) BP: (112-149)/(53-77) 121/57 (08/06 0715) SpO2:  [85 %-100 %] 85 % (08/06 0741)     Height: 5' 1 (154.9 cm) Weight: 42.2 kg BMI (Calculated): 17.59   Intake/Output last 2 shifts:  08/05 0701 - 08/06 0700 In: 4186.7 [P.O.:1560; IV Piggyback:2626.7] Out: 600 [Urine:600]   Physical Exam:  Constitutional: alert, cooperative and no distress  Respiratory: breathing non-labored at rest, 4L [place] Cardiovascular: regular rate and sinus rhythm  Gastrointestinal: soft, non-tender, and non-distended  Labs:     Latest Ref Rng & Units 03/18/2024    7:08 AM 03/17/2024    4:49 PM 03/17/2024    4:43 AM  CBC  WBC 4.0 - 10.5 K/uL 10.2   9.2   Hemoglobin 12.0 - 15.0 g/dL 8.0  7.6  8.1   Hematocrit 36.0 - 46.0 % 24.6  23.0  25.4   Platelets 150 - 400 K/uL 203   218       Latest Ref Rng & Units 03/18/2024    5:29 AM 03/17/2024    4:43 AM 03/16/2024    6:13 AM  CMP  Glucose 70 - 99 mg/dL 878  865  878   BUN 8 - 23 mg/dL 33  28  24   Creatinine 0.44 - 1.00 mg/dL 9.04  9.14  9.42   Sodium 135 - 145 mmol/L 138  137  142   Potassium 3.5 - 5.1 mmol/L 5.6  4.7  4.4   Chloride 98 - 111 mmol/L 105  104  106   CO2 22 - 32 mmol/L 27  26  24    Calcium  8.9 - 10.3 mg/dL 8.5  8.1  8.7     Imaging studies: No new pertinent imaging studies   Assessment/Plan:  89 y.o. female with small bowel obstruction, Day 4, complicated by pertinent comorbidities including PAD with symptoms of  left leg, severe COPD, chronic bronchiectasis, chronic hypoxic respiratory failure with home O2, pleural nodule, HTN, HDL, history of bladder CA and prediabetes.   Small bowel obstruction -resolving              - Tolerated full liquid diet without any complications and has had a few bowel movements. Will advance to soft diet. SBO has likely resolved. Very unlikely patient will need surgery at this point. Will continue to monitor.             - Continue MiraLAX  2x daily - Continue pain management               - DVT prophylaxis   Hypoxic respiratory failure/pneumonia/COPD exacerbation/pleural effusions              -Managed by medicine team              -Still on 4L [place] (2L of O2 at baseline)  -Continue IV rocephin /azithromycin    -IR planning thoracentesis this morning for left pleural effusion.             -  Continue to follow their recommendations    Acute on chronic limb ischemia of left lower leg              - Managed by vascular team              - Had left lower extremity angiography om 03/16/24 - stent placement x2  - Was started Aggrastat  infusion post angiogram until 08/05. Will continue dual antiplatelet therapy with aspirin  and plavix              - Continue to follow their recommendations  -- Kayanna Mckillop Barrientos PA-C

## 2024-03-18 NOTE — Progress Notes (Signed)
 [facility] Progress Note  Patient: Misty Ferguson    FMW:[id number]  DOA: 03/14/2024     Date of Service: the patient was seen and examined on 03/18/2024  Chief Complaint  Patient presents with   Respiratory Distress   Brief hospital course: Misty Ferguson is a 89 y.o. female with medical history significant of Severe COPD  , Chronic bronchiectasis , chronic hypoxic respiratory failure home O2 with exertion and at bedtime ,  Pleural nodule, HTN, PAD with symptoms predominantly in the left leg , HLD, Prediabetes, bladder CA s/p TURBT 05/22/2019 s/p chemo and xrt,tobacco abuse who presents to ED BIB EMS S/p call out for a fall. However on arrival EMS noted patient was in respiratory distress.  She was found sitting on toilet with multiple skin tears noted.  Her O2 sat was 85% . Of note patient is on home O2 with exertion and at bedtime but typically maintains sats well at rest on RA. Patient was treated with albuterol   and transported to ED. Of note if patient has any head trauma. Patient does not complaint of any HA.   ED Course:  Patient was diagnosed with AECOPD/CAP, s/p Bipap, transition to O2 via [place] further management as below.   LLE, no dopplerable pulse.  Seen by Dr. Marea vascular surgery and started heparin  IV infusion. TRH was consulted for admission and further management as below.   Assessment and Plan:  # Acute on chronic critical limb ischemia left lower extremity Continue Plavix  75 mg p.o. daily S/p Heparin  IV infusion, on 8/4 started Aggrastat  infusion after angiogram as per vascular surgery till 8/5 Vascular surgery consulted s/p angioplasty and stent inserted in left SFA, and left profunda femoris artery done on 8/4 8/5 continue DAPT, off anticoagulation  # Small bowel obstruction.  Resolved with conservative management 8/3 respiratory status got worse and patient was not feeling right, also having constipation, not passing gas.  Feeling abdominal pressure and abdominal  tenderness. KUB positive for small bowel obstruction CT a/p: Small-bowel obstruction, with dilated proximal small bowel measuring up to 3.6 cm. The exact point of obstruction is not identified, but the distal jejunum and ileum are decompressed. Moderate retained stool throughout the colon. General surgery consulted. 8/4 SBFT: Contrast material throughout the colon with decompressed small bowel. No current evidence for bowel obstruction. 8/4 SBO resolved, NGT clamped, started clear liquid diet as per general surgery. 8/5 NGT removed, FL diet started.  Patient moved bowels.  Dulcolax suppository was given.  Continue laxatives.   # Acute on chronic hypoxic respiratory failure due to pneumonia and COPD exacerbation On 2 L oxygen  at baseline S/p BiPAP in the ED, use BiPAP as needed Continue supplemental O2 admission and gradually wean down to baseline Continue breathing treatment Status post IV Solu-Medrol , continue prednisone  oral 8/3 CT chest: Right lower lobe bronchial wall thickening with areas of mucoid impaction, which could be sequela of aspiration or infection. Moderate partially loculated left pleural effusion, with compressive atelectasis in the dependent left lower lobe, not appreciably changed since prior study. 2. New trace free-flowing right pleural effusion with minimal right lower lobe atelectasis.  # Pleural effusion, left Patient may benefit from thoracentesis. Follow IR for thoracentesis today 8/6   # Community-acquired pneumonia Continue cefepime  and azithromycin  x 5 days Mucinex  600 twice daily Pleural effusion on left side most likely due to community-acquired pneumonia CT chest shows moderate left loculated pleural effusion  # Acute blood loss anemia 8/4 Hb 7.0, transfuse 1 unit  of PRBC 8/5 Hb 8.1 >> 7.6 Monitor H&H and transfuse as needed    # Hypertension, HLD Continue losartan  25 twice daily, verapamil  360 mg, Lipitor 10 mg daily Monitor BP and titrate  medications accordingly  # Constipation, due to SBO, now resolved 8/5 s/p Dulcolax suppository.  Continue current laxatives.  # Multiple skin tears right upper and lower extremity 8/5 active bleeding from right hand in between 3rd and 4th fingers, surgical Steri-Strip applied followed by pressure bandage.  RN was advised to place ice pack 8/6 bleeding stopped from the hand, dressing CDI Continue wound care Continue fall precaution Ambulate with assistance Follow PT OT eval   # Prediabetes -monitor poc   # Hx Bladder CA s/p TURBT 05/22/2019 s/p chemo and xrt -monitor of for hematuria , due to patient history    # Tobacco abuse -in remission    # Pleural nodule -followed by pulmonary Dr Isaiah   # Hyperkalemia Lokelma  10 g x 1 dose ordered on 8/6 Hold off ACE inhibitor today Monitor BMP daily Started low potassium diet  Body mass index is 17.58 kg/m.  Interventions:  Diet: CLD >>FLD 8/5 DVT Prophylaxis: s/p Hep gtt and Aggrastat  infusion. Start Lovenox  SQ nightly on 8/6  Advance goals of care discussion: DNR/DNI, Limited  Family Communication: family was present at bedside, at the time of interview.  The pt provided permission to discuss medical plan with the family. Opportunity was given to ask question and all questions were answered satisfactorily.   Disposition:  Pt is from Home, admitted with CAP, COPD, Resp Failure, critical LLE limb ischemia, on IV heparin  infusion and IV antibiotics, which precludes a safe discharge. Discharge to home versus SNF TBD after improvement, when stable, may need few days to improve.  Subjective: No significant events overnight.  Rectal bleeding stopped.  Does not feel any improvement in the breathing, she was short of breath intermittently. Denied any chest pain.  Patient agreed for thoracentesis today. Leg pain is well-controlled with medications.  Physical Exam: General: NAD, lying comfortably, mild SOB Appear in mild distress,  affect slightly depressed Eyes: PERRLA ENT: Oral Mucosa Clear, moist  Neck: no JVD,  Cardiovascular: S1 and S2 Present, no Murmur,  Respiratory: Equal air entry bilaterally, decreased breath sounds on the left lower side, mild crackles and but no wheezes Abdomen: Bowel Sound present, Soft and no tenderness,  Skin: Multiple skin tears right arm and right leg, multiple bruises. Dressing intact.   Extremities: LLE: Palpable pulse and warm to touch, No Pedal edema, mild tenderness.  Chronic pigmentation Neurologic: without any new focal findings Gait not checked due to patient safety concerns  Vitals:   03/18/24 0404 03/18/24 0715 03/18/24 0741 03/18/24 1114  BP: (!) 119/53 (!) 121/57  (!) 110/54  Pulse: 91 97  97  Resp: 20 20  18   Temp: 97.8 F (36.6 C) (!) 97.5 F (36.4 C)  98 F (36.7 C)  TempSrc:      SpO2: 100% (!) 88% (!) 85% 98%  Weight:      Height:        Intake/Output Summary (Last 24 hours) at 03/18/2024 1410 Last data filed at 03/18/2024 0900 Gross per 24 hour  Intake 3106.67 ml  Output 600 ml  Net 2506.67 ml   Filed Weights   03/14/24 0711 03/14/24 2142 03/16/24 0924  Weight: 40.4 kg 42.2 kg 42.2 kg    Data Reviewed: I have personally reviewed and interpreted daily labs, tele strips, imagings as discussed  above. I reviewed all nursing notes, pharmacy notes, vitals, pertinent old records I have discussed plan of care as described above with RN and patient/family.  CBC: Recent Labs  Lab 03/15/24 0714 03/15/24 0903 03/16/24 0613 03/16/24 1603 03/17/24 0443 03/17/24 1649 03/18/24 0708  WBC 5.7 6.5 9.8  --  9.2  --  10.2  HGB 8.7* 9.1* 7.5* 7.0* 8.1* 7.6* 8.0*  HCT 26.5* 28.4* 23.5* 21.3* 25.4* 23.0* 24.6*  MCV 95.3 95.9 95.5  --  90.1  --  89.5  PLT 255 272 267  --  218  --  203   Basic Metabolic Panel: Recent Labs  Lab 03/15/24 0903 03/16/24 0613 03/17/24 0443 03/18/24 0529 03/18/24 1252  NA 140 142 137 138 139  K 4.4 4.4 4.7 5.6* 5.4*  CL 105  106 104 105 104  CO2 26 24 26 27 28   GLUCOSE 149* 121* 134* 121* 137*  BUN 22 24* 28* 33* 36*  CREATININE 0.62 0.57 0.85 0.95 1.07*  CALCIUM  8.8* 8.7* 8.1* 8.5* 8.8*  MG 1.9 2.0 2.0 2.3  --   PHOS 3.2 3.5 3.9 3.0  --     Studies: No results found.   Scheduled Meds:  arformoterol   15 mcg Nebulization BID   aspirin  EC  81 mg Oral Daily   atorvastatin   10 mg Oral Daily   bisacodyl   10 mg Oral QHS   clopidogrel   75 mg Oral Daily   enoxaparin  (LOVENOX ) injection  30 mg Subcutaneous QHS   feeding supplement  1 Container Oral TID BM   guaiFENesin   600 mg Oral BID   ipratropium-albuterol   3 mL Nebulization Q6H   losartan   25 mg Oral Daily   methylPREDNISolone  (SOLU-MEDROL ) injection  40 mg Intravenous Q24H   pantoprazole  (PROTONIX ) IV  40 mg Intravenous BID   polyethylene glycol  17 g Oral BID   verapamil   360 mg Oral Daily   Continuous Infusions:  azithromycin      cefTRIAXone  (ROCEPHIN )  IV 200 mL/hr at 03/17/24 1700   PRN Meds: albuterol , morphine  injection, ondansetron  **OR** ondansetron  (ZOFRAN ) IV, oxyCODONE , phenol  Time spent: 55 minutes  Author: ELVAN SOR. MD [facility] 03/18/2024 2:10 PM  To reach On-call, see care teams to locate the attending and reach out to them via [email or web address]. If 7PM-7AM, please contact night-coverage If you still have difficulty reaching the attending provider, please page the [facility] (Director on Call) for [facility] on amion for assistance.

## 2024-03-18 NOTE — Progress Notes (Addendum)
 Pt refused dulcolax and miralax  but was educated about its importance. Pt verbalized understanding but still refused at this time.MD Mansy made aware.  Update 2358: No new order place. Will notify incoming shift.

## 2024-03-18 NOTE — Plan of Care (Signed)
  Problem: Clinical Measurements: Goal: Ability to maintain clinical measurements within normal limits will improve Outcome: Progressing   Problem: Clinical Measurements: Goal: Diagnostic test results will improve Outcome: Progressing   Problem: Activity: Goal: Risk for activity intolerance will decrease Outcome: Progressing   Problem: Nutrition: Goal: Adequate nutrition will be maintained Outcome: Progressing   Problem: Coping: Goal: Level of anxiety will decrease Outcome: Progressing

## 2024-03-18 NOTE — Progress Notes (Signed)
 OT Cancellation Note  Patient Details Name: Misty Ferguson MRN: [id number] DOB: 03-27-1934   Cancelled Treatment:    Reason Eval/Treat Not Completed: Patient at procedure or test/ unavailable. Chart reviewed, pt off unit for thoracentesis, will hold and re-attempt as available.   Elston Slot, M.S. OTR/L  03/18/24, 3:04 PM  ascom [phone]

## 2024-03-18 NOTE — Procedures (Signed)
 PROCEDURE SUMMARY:  Successful image-guided left-sided diagnostic and therapeutic thoracentesis. Yielded 1.1 liters of clear, straw-colored pleural fluid. Patient tolerated procedure well. EBL: Zero No immediate complications.  Specimen was sent for labs. Post procedure CXR is pending.  Please see imaging section of Epic for full dictation.  Carlin DELENA Griffon PA-C 03/18/2024 3:38 PM

## 2024-03-18 NOTE — Progress Notes (Signed)
 Progress Note    03/18/2024 11:54 AM 2 Days Post-Op  Subjective:  Misty Ferguson is an 89 yo female now POD #1 from:   Procedure(s) Performed:             1.  Ultrasound guidance for vascular access right femoral artery             2.  Catheter placement into left superficial femoral artery and left profunda femoris artery from right femoral approach             3.  Aortogram and selective left lower extremity angiogram including separate profunda femoris angiogram and intervention             4.  Mechanical thrombectomy of the left SFA and popliteal arteries with the Rota Rex device             5.  Angioplasty of the left peroneal artery, tibioperoneal trunk, and distal popliteal artery with 3 mm diameter angioplasty balloon             6.  Stent placement to the left popliteal artery with 5 mm diameter by 5 cm length Viabahn stent             7.  Stent placement x 2 to the left SFA with 6 mm diameter by 15 cm length and 6 mm diameter by 10 cm length Viabahn stent             8.  Angioplasty of the left profunda femoris artery with 4 mm diameter by 4 cm length Lutonix drug-coated angioplasty balloon             9.  StarClose closure device right femoral artery   Patient is resting comfortably in bed this morning.  She endorses the pain to her left lower extremity has now subsided.  She also endorses that her left lower extremity feels much warmer this morning.  No other complaints overnight.  Vitals are remained stable.   Vitals:   03/18/24 0741 03/18/24 1114  BP:  (!) 110/54  Pulse:  97  Resp:  18  Temp:  98 F (36.7 C)  SpO2: (!) 85% 98%   Physical Exam: Cardiac:  RRR, normal S1-S2 no murmurs. Lungs: Lung sounds coarse on auscultation throughout.  Patient history of COPD on 2 L nasal cannula oxygen . Incisions: Right groin incision with dressing clean dry and intact.  No hematoma seroma to note. Extremities: Large skin tear to the lateral aspect of the right knee bandaged.   Multiple areas of ecchymotic old bruising to bilateral lower extremities.  Also noted skin tear to the right elbow of the right second finger from her fall.  These are all dressed with bandages.  Positive Doppler pulses today to DP and PT her left lower extremity. Abdomen: Positive bowel sounds, nontender nondistended. Neurologic: Alert and oriented x 3, answers all questions and follows commands appropriately.   CBC    Component Value Date/Time   WBC 10.2 03/18/2024 0708   RBC 2.75 (L) 03/18/2024 0708   HGB 8.0 (L) 03/18/2024 0708   HGB 12.4 04/30/2019 1613   HCT 24.6 (L) 03/18/2024 0708   HCT 37.1 04/30/2019 1613   PLT 203 03/18/2024 0708   PLT 295 04/30/2019 1613   MCV 89.5 03/18/2024 0708   MCV 92 04/30/2019 1613   MCH 29.1 03/18/2024 0708   MCHC 32.5 03/18/2024 0708   RDW 19.0 (H) 03/18/2024 0708   RDW 11.5 (L) 04/30/2019 1613  LYMPHSABS 0.2 (L) 07/26/2022 0448   LYMPHSABS 1.4 04/30/2019 1613   MONOABS 0.4 07/26/2022 0448   EOSABS 0.0 07/26/2022 0448   EOSABS 0.2 04/30/2019 1613   BASOSABS 0.0 07/26/2022 0448   BASOSABS 0.1 04/30/2019 1613    BMET    Component Value Date/Time   NA 138 03/18/2024 0529   NA 141 06/10/2019 1324   K 5.6 (H) 03/18/2024 0529   K 4.1 07/23/2012 1536   CL 105 03/18/2024 0529   CO2 27 03/18/2024 0529   GLUCOSE 121 (H) 03/18/2024 0529   BUN 33 (H) 03/18/2024 0529   BUN 15 06/10/2019 1324   CREATININE 0.95 03/18/2024 0529   CREATININE 1.26 (H) 12/23/2012 1440   CALCIUM  8.5 (L) 03/18/2024 0529   GFRNONAA 58 (L) 03/18/2024 0529   GFRAA 62 06/10/2019 1324    INR    Component Value Date/Time   INR 1.0 03/14/2024 0709     Intake/Output Summary (Last 24 hours) at 03/18/2024 1154 Last data filed at 03/18/2024 0900 Gross per 24 hour  Intake 3586.67 ml  Output 600 ml  Net 2986.67 ml     Assessment/Plan:  89 y.o. female is s/p SEE ABOVE  2 Days Post-Op   PLAN Patient to continue on ASA 81 mg Daily, Plavix  75 mg daily and Lipitor 10  mg daily.  Patient needs to be on ASA and Plavix  due to stent placement  Continue to monitor left lower extremity ischemia and check pulses. Right groin with dressing clean dry and intact.  No hematoma seroma to note.   DVT prophylaxis: Aspirin  81 mg daily Plavix  75 mg daily and Lipitor 10 mg daily.   Misty Ferguson [facility] 03/18/2024 11:54 AM

## 2024-03-18 NOTE — Progress Notes (Signed)
 Daily Progress Note   Patient Name: Misty Ferguson       Date: 03/18/2024 DOB: 1933-08-26  Age: 89 y.o. MRN#: [id number] Attending Physician: Von Bellis, MD Primary Care Physician: Avelina Greig BRAVO, MD Admit Date: 03/14/2024  Reason for Consultation/Follow-up: Establishing goals of care  Subjective: Notes and labs reviewed.  Into see patient; husband is at bedside.  Upon my injury she is using incentive spirometer and is able to reach 1,000.  She discusses using I-S and flutter valve.  She discusses feeling better and feeling stronger.  Patient continuing care and treating the treatable at this time.   Length of Stay: 4  Current Medications: Scheduled Meds:   arformoterol   15 mcg Nebulization BID   aspirin  EC  81 mg Oral Daily   atorvastatin   10 mg Oral Daily   bisacodyl   10 mg Oral QHS   clopidogrel   75 mg Oral Daily   enoxaparin  (LOVENOX ) injection  30 mg Subcutaneous QHS   feeding supplement  1 Container Oral TID BM   guaiFENesin   600 mg Oral BID   ipratropium-albuterol   3 mL Nebulization Q6H   losartan   25 mg Oral Daily   methylPREDNISolone  (SOLU-MEDROL ) injection  40 mg Intravenous Q24H   pantoprazole  (PROTONIX ) IV  40 mg Intravenous BID   polyethylene glycol  17 g Oral BID   verapamil   360 mg Oral Daily    Continuous Infusions:  azithromycin      cefTRIAXone  (ROCEPHIN )  IV 200 mL/hr at 03/17/24 1700    PRN Meds: albuterol , morphine  injection, ondansetron  **OR** ondansetron  (ZOFRAN ) IV, oxyCODONE , phenol  Physical Exam Pulmonary:     Effort: Pulmonary effort is normal.  Neurological:     Mental Status: She is alert.             Vital Signs: BP (!) 110/54 (BP Location: Left Arm)   Pulse 97   Temp 98 F (36.7 C)   Resp 18   Ht 5' 1 (1.549 m)   Wt 42.2 kg    SpO2 98%   BMI 17.58 kg/m  SpO2: SpO2: 98 % O2 Device: O2 Device: Nasal Cannula O2 Flow Rate: O2 Flow Rate (L/min): 5 L/min  Intake/output summary:  Intake/Output Summary (Last 24 hours) at 03/18/2024 1348 Last data filed at 03/18/2024 0900 Gross per 24 hour  Intake  3106.67 ml  Output 600 ml  Net 2506.67 ml   LBM: Last BM Date : 03/17/24 Baseline Weight: Weight: 40.4 kg Most recent weight: Weight: 42.2 kg   Patient Active Problem List   Diagnosis Date Noted   Critical limb ischemia of left lower extremity (HCC) 03/17/2024   CAP (community acquired pneumonia) 03/14/2024   Atherosclerosis of native arteries of extremity with intermittent claudication (HCC) 05/28/2023   Multiple skin tears 02/08/2023   Chronic toe pain, bilateral 11/20/2022   DNR (do not resuscitate) 11/20/2022   Acute bacterial bronchitis 07/26/2022   Protein-calorie malnutrition, severe (HCC) 07/26/2022   Acute on chronic respiratory failure with hypoxia (HCC) 07/25/2022   Acute cough 01/23/2022   PVD (peripheral vascular disease) (HCC) 12/14/2021   Decreased pulses in feet 11/06/2021   Goals of care, counseling/discussion 06/14/2019   History of bladder cancer 06/08/2019   Senile purpura (HCC) 10/04/2016   Peripheral neuropathy 07/24/2016   Chronic hypoxemic respiratory failure (HCC) 08/25/2012   HYPERCHOLESTEROLEMIA 03/24/2008   Macular degeneration (senile) of retina 12/10/2006   Essential hypertension, benign 12/10/2006   Centrilobular emphysema (HCC) 12/10/2006   Osteoporosis 12/10/2006    Palliative Care Assessment & Plan     Recommendations/Plan: DNR/DNI. Continue to treat the treatable.  Code Status:    Code Status Orders  (From admission, onward)           Start     Ordered   03/15/24 1848  Do not attempt resuscitation (DNR)- Limited -Do Not Intubate (DNI)  (Code Status)  Continuous       Question Answer Comment  If pulseless and not breathing No CPR or chest compressions.   In  Pre-Arrest Conditions (Patient Is Breathing and Has A Pulse) Do not intubate. Provide all appropriate non-invasive medical interventions. Avoid ICU transfer unless indicated or required.   Consent: Discussion documented in EHR or advanced directives reviewed      03/15/24 1847           Code Status History     Date Active Date Inactive Code Status Order ID Comments User Context   03/14/2024 1542 03/15/2024 1847 Full Code [id number]  Debby Camila LABOR, MD ED   06/03/2023 0949 06/03/2023 1658 Full Code [id number]  Marea Selinda RAMAN, MD Inpatient   07/25/2022 1341 07/27/2022 2145 DNR [id number]  Moody Alto, MD ED   11/20/2021 1109 11/20/2021 1840 Full Code [id number]  Marea Selinda RAMAN, MD Inpatient   10/23/2017 0550 10/25/2017 1858 DNR [id number]  Stephania Ozell RAMAN Inpatient   10/23/2017 0435 10/23/2017 0550 Full Code [id number]  Stephania Ozell RAMAN Inpatient        Camelia Lewis, NP  Please contact Palliative Medicine Team phone at [phone] for questions and concerns.

## 2024-03-18 NOTE — Plan of Care (Signed)
  Problem: Education: Goal: Knowledge of General Education information will improve Description: Including pain rating scale, medication(s)/side effects and non-pharmacologic comfort measures Outcome: Progressing   Problem: Clinical Measurements: Goal: Will remain free from infection Outcome: Progressing   Problem: Clinical Measurements: Goal: Respiratory complications will improve Outcome: Progressing   Problem: Clinical Measurements: Goal: Cardiovascular complication will be avoided Outcome: Progressing   Problem: Elimination: Goal: Will not experience complications related to bowel motility Outcome: Progressing   Problem: Elimination: Goal: Will not experience complications related to urinary retention Outcome: Progressing   Problem: Pain Managment: Goal: General experience of comfort will improve and/or be controlled Outcome: Progressing

## 2024-03-19 ENCOUNTER — Encounter (INDEPENDENT_AMBULATORY_CARE_PROVIDER_SITE_OTHER)

## 2024-03-19 ENCOUNTER — Ambulatory Visit (INDEPENDENT_AMBULATORY_CARE_PROVIDER_SITE_OTHER): Admitting: Nurse Practitioner

## 2024-03-19 DIAGNOSIS — I70222 Atherosclerosis of native arteries of extremities with rest pain, left leg: Secondary | ICD-10-CM | POA: Diagnosis not present

## 2024-03-19 DIAGNOSIS — Z7189 Other specified counseling: Secondary | ICD-10-CM | POA: Diagnosis not present

## 2024-03-19 DIAGNOSIS — J189 Pneumonia, unspecified organism: Secondary | ICD-10-CM | POA: Diagnosis not present

## 2024-03-19 LAB — BASIC METABOLIC PANEL WITH GFR
Anion gap: 7 (ref 5–15)
BUN: 37 mg/dL — ABNORMAL HIGH (ref 8–23)
CO2: 27 mmol/L (ref 22–32)
Calcium: 8.5 mg/dL — ABNORMAL LOW (ref 8.9–10.3)
Chloride: 103 mmol/L (ref 98–111)
Creatinine, Ser: 0.91 mg/dL (ref 0.44–1.00)
GFR, Estimated: >60 mL/min (ref >60)
Glucose, Bld: 158 mg/dL — ABNORMAL HIGH (ref 70–99)
Potassium: 5.4 mmol/L — ABNORMAL HIGH (ref 3.5–5.1)
Sodium: 137 mmol/L (ref 135–145)

## 2024-03-19 LAB — CULTURE, BLOOD (ROUTINE X 2)
Culture: NO GROWTH
Culture: NO GROWTH

## 2024-03-19 LAB — HEMOGLOBIN AND HEMATOCRIT, BLOOD
HCT: 31.2 % — ABNORMAL LOW (ref 36.0–46.0)
Hemoglobin: 10.6 g/dL — ABNORMAL LOW (ref 12.0–15.0)

## 2024-03-19 LAB — CBC
HCT: 21 % — ABNORMAL LOW (ref 36.0–46.0)
Hemoglobin: 6.7 g/dL — ABNORMAL LOW (ref 12.0–15.0)
MCH: 29.1 pg (ref 26.0–34.0)
MCHC: 31.9 g/dL (ref 30.0–36.0)
MCV: 91.3 fL (ref 80.0–100.0)
Platelets: 200 K/uL (ref 150–400)
RBC: 2.3 MIL/uL — ABNORMAL LOW (ref 3.87–5.11)
RDW: 18.2 % — ABNORMAL HIGH (ref 11.5–15.5)
WBC: 7.1 K/uL (ref 4.0–10.5)
nRBC: 0 % (ref 0.0–0.2)

## 2024-03-19 LAB — HEPATIC FUNCTION PANEL
ALT: 226 U/L — ABNORMAL HIGH (ref 0–44)
AST: 107 U/L — ABNORMAL HIGH (ref 15–41)
Albumin: 2.5 g/dL — ABNORMAL LOW (ref 3.5–5.0)
Alkaline Phosphatase: 91 U/L (ref 38–126)
Bilirubin, Direct: <0.1 mg/dL (ref 0.0–0.2)
Total Bilirubin: 0.7 mg/dL (ref 0.0–1.2)
Total Protein: 4.6 g/dL — ABNORMAL LOW (ref 6.5–8.1)

## 2024-03-19 LAB — PATHOLOGIST SMEAR REVIEW

## 2024-03-19 LAB — LACTATE DEHYDROGENASE: LDH: 191 U/L (ref 98–192)

## 2024-03-19 LAB — MAGNESIUM: Magnesium: 2 mg/dL (ref 1.7–2.4)

## 2024-03-19 LAB — PROCALCITONIN: Procalcitonin: <0.1 ng/mL

## 2024-03-19 LAB — PHOSPHORUS: Phosphorus: 3 mg/dL (ref 2.5–4.6)

## 2024-03-19 LAB — PREPARE RBC (CROSSMATCH)

## 2024-03-19 MED ORDER — PANTOPRAZOLE SODIUM 40 MG PO TBEC
40.0000 mg | DELAYED_RELEASE_TABLET | Freq: Two times a day (BID) | ORAL | Status: DC
  Administered 2024-03-19 – 2024-03-22 (×6): 40 mg via ORAL
  Filled 2024-03-19 (×6): qty 1

## 2024-03-19 MED ORDER — POLYETHYLENE GLYCOL 3350 17 G PO PACK
17.0000 g | PACK | Freq: Every day | ORAL | Status: DC
  Administered 2024-03-22: 17 g via ORAL
  Filled 2024-03-19 (×3): qty 1

## 2024-03-19 MED ORDER — SODIUM CHLORIDE 0.9% IV SOLUTION: Freq: Once | INTRAVENOUS | Status: AC

## 2024-03-19 MED ORDER — IPRATROPIUM-ALBUTEROL 0.5-2.5 (3) MG/3ML IN SOLN
3.0000 mL | Freq: Two times a day (BID) | RESPIRATORY_TRACT | Status: DC
  Administered 2024-03-19 – 2024-03-22 (×6): 3 mL via RESPIRATORY_TRACT
  Filled 2024-03-19 (×6): qty 3

## 2024-03-19 MED ORDER — SODIUM ZIRCONIUM CYCLOSILICATE 10 G PO PACK
10.0000 g | PACK | Freq: Once | ORAL | Status: AC
  Administered 2024-03-19: 10 g via ORAL
  Filled 2024-03-19: qty 1

## 2024-03-19 NOTE — Progress Notes (Addendum)
 Daily Progress Note   Patient Name: Misty Ferguson       Date: 03/19/2024 DOB: 1934/03/10  Age: 89 y.o. MRN#: [id number] Attending Physician: Von Bellis, MD Primary Care Physician: Avelina Greig BRAVO, MD Admit Date: 03/14/2024  Reason for Consultation/Follow-up: Establishing goals of care  Subjective: Notes and labs reviewed.  Into see patient, husband is at bedside.  Patient states she is having bowel movements, and is happy she is continuing to improve.  She states she has been having some lung pain with coughing, and states that this pain has been present during the entirety of the admission and for a while prior to the admission.  Nursing in to provide medication for pain.  Discussed symptom management.  Patient discusses she is grateful for any improvement, and states she is ready to go with the [place] if he feels it is time to call her home.  She confirms DNR/DNI status.  She states she would not ever want dialysis or to need to live by machines.  She would like to continue to treat the treatable at this time.   As goals are set, PMT will sign off.  Please reconsult if needs arise.   Length of Stay: 5  Current Medications: Scheduled Meds:   arformoterol   15 mcg Nebulization BID   aspirin  EC  81 mg Oral Daily   atorvastatin   10 mg Oral Daily   bisacodyl   10 mg Oral QHS   clopidogrel   75 mg Oral Daily   enoxaparin  (LOVENOX ) injection  30 mg Subcutaneous QHS   feeding supplement  1 Container Oral TID BM   guaiFENesin   600 mg Oral BID   ipratropium-albuterol   3 mL Nebulization BID   methylPREDNISolone  (SOLU-MEDROL ) injection  40 mg Intravenous Q24H   pantoprazole   40 mg Oral BID AC   polyethylene glycol  17 g Oral Daily   verapamil   360 mg Oral Daily    Continuous  Infusions:   PRN Meds: albuterol , morphine  injection, ondansetron  **OR** ondansetron  (ZOFRAN ) IV, oxyCODONE , phenol  Physical Exam Pulmonary:     Effort: Pulmonary effort is normal.  Skin:    General: Skin is warm and dry.  Neurological:     Mental Status: She is alert.             Vital Signs: BP 129/60 (  BP Location: Left Arm)   Pulse 100   Temp 98.5 F (36.9 C) (Oral)   Resp 19   Ht 5' 1 (1.549 m)   Wt 42.2 kg   SpO2 100%   BMI 17.58 kg/m  SpO2: SpO2: 100 % O2 Device: O2 Device: Nasal Cannula O2 Flow Rate: O2 Flow Rate (L/min): 3 L/min  Intake/output summary:  Intake/Output Summary (Last 24 hours) at 03/19/2024 1236 Last data filed at 03/19/2024 0900 Gross per 24 hour  Intake 540 ml  Output 600 ml  Net -60 ml   LBM: Last BM Date : 03/18/24 Baseline Weight: Weight: 40.4 kg Most recent weight: Weight: 42.2 kg   Patient Active Problem List   Diagnosis Date Noted   Critical limb ischemia of left lower extremity (HCC) 03/17/2024   CAP (community acquired pneumonia) 03/14/2024   Atherosclerosis of native arteries of extremity with intermittent claudication (HCC) 05/28/2023   Multiple skin tears 02/08/2023   Chronic toe pain, bilateral 11/20/2022   DNR (do not resuscitate) 11/20/2022   Acute bacterial bronchitis 07/26/2022   Protein-calorie malnutrition, severe (HCC) 07/26/2022   Acute on chronic respiratory failure with hypoxia (HCC) 07/25/2022   Acute cough 01/23/2022   PVD (peripheral vascular disease) (HCC) 12/14/2021   Decreased pulses in feet 11/06/2021   Goals of care, counseling/discussion 06/14/2019   History of bladder cancer 06/08/2019   Senile purpura (HCC) 10/04/2016   Peripheral neuropathy 07/24/2016   Chronic hypoxemic respiratory failure (HCC) 08/25/2012   HYPERCHOLESTEROLEMIA 03/24/2008   Macular degeneration (senile) of retina 12/10/2006   Essential hypertension, benign 12/10/2006   Centrilobular emphysema (HCC) 12/10/2006   Osteoporosis  12/10/2006    Palliative Care Assessment & Plan   Recommendations/Plan: DNR/DNI. Patient states she would never want to be kept alive by machines and would not want dialysis. She would like to continue to treat the treatable. PMT will sign off as goals are set.  Please reconsult if needs arise.   Code Status:    Code Status Orders  (From admission, onward)           Start     Ordered   03/15/24 1848  Do not attempt resuscitation (DNR)- Limited -Do Not Intubate (DNI)  (Code Status)  Continuous       Question Answer Comment  If pulseless and not breathing No CPR or chest compressions.   In Pre-Arrest Conditions (Patient Is Breathing and Has A Pulse) Do not intubate. Provide all appropriate non-invasive medical interventions. Avoid ICU transfer unless indicated or required.   Consent: Discussion documented in EHR or advanced directives reviewed      03/15/24 1847           Code Status History     Date Active Date Inactive Code Status Order ID Comments User Context   03/14/2024 1542 03/15/2024 1847 Full Code [id number]  Debby Camila LABOR, MD ED   06/03/2023 0949 06/03/2023 1658 Full Code [id number]  Marea Selinda RAMAN, MD Inpatient   07/25/2022 1341 07/27/2022 2145 DNR [id number]  Moody Alto, MD ED   11/20/2021 1109 11/20/2021 1840 Full Code [id number]  Marea Selinda RAMAN, MD Inpatient   10/23/2017 0550 10/25/2017 1858 DNR [id number]  Stephania Ozell RAMAN Inpatient   10/23/2017 0435 10/23/2017 0550 Full Code [id number]  Stephania Ozell RAMAN Inpatient      Thank you for allowing the Palliative Medicine Team to assist in the care of this patient.    Camelia Lewis, NP  Please contact Palliative Medicine Team phone at [phone] for  questions and concerns.

## 2024-03-19 NOTE — Progress Notes (Signed)
 [facility] Progress Note  Patient: Misty Ferguson    FMW:[id number]  DOA: 03/14/2024     Date of Service: the patient was seen and examined on 03/19/2024  Chief Complaint  Patient presents with   Respiratory Distress   Brief hospital course: Misty Ferguson is a 89 y.o. female with medical history significant of Severe COPD  , Chronic bronchiectasis , chronic hypoxic respiratory failure home O2 with exertion and at bedtime ,  Pleural nodule, HTN, PAD with symptoms predominantly in the left leg , HLD, Prediabetes, bladder CA s/p TURBT 05/22/2019 s/p chemo and xrt,tobacco abuse who presents to ED BIB EMS S/p call out for a fall. However on arrival EMS noted patient was in respiratory distress.  She was found sitting on toilet with multiple skin tears noted.  Her O2 sat was 85% . Of note patient is on home O2 with exertion and at bedtime but typically maintains sats well at rest on RA. Patient was treated with albuterol   and transported to ED. Of note if patient has any head trauma. Patient does not complaint of any HA.   ED Course:  Patient was diagnosed with AECOPD/CAP, s/p Bipap, transition to O2 via [place] further management as below.   LLE, no dopplerable pulse.  Seen by Dr. Marea vascular surgery and started heparin  IV infusion. TRH was consulted for admission and further management as below.   Assessment and Plan:  # Acute on chronic critical limb ischemia left lower extremity Continue ASA 81 mg and Plavix  75 mg p.o. daily S/p Heparin  IV infusion, on 8/4 started Aggrastat  infusion after angiogram as per vascular surgery till 8/5 Vascular surgery consulted s/p angioplasty and stent inserted in left SFA, and left profunda femoris artery done on 8/4 8/5 continue DAPT, off anticoagulation  # Small bowel obstruction.  Resolved with conservative management 8/3 respiratory status got worse and patient was not feeling right, also having constipation, not passing gas.  Feeling abdominal pressure  and abdominal tenderness. KUB positive for small bowel obstruction CT a/p: Small-bowel obstruction, with dilated proximal small bowel measuring up to 3.6 cm. The exact point of obstruction is not identified, but the distal jejunum and ileum are decompressed. Moderate retained stool throughout the colon. General surgery consulted. 8/4 SBFT: Contrast material throughout the colon with decompressed small bowel. No current evidence for bowel obstruction. 8/4 SBO resolved, NGT clamped, started clear liquid diet as per general surgery. 8/5 NGT removed, FL diet started.  Patient moved bowels.  Dulcolax suppository was given.  Continue laxatives.   # Acute on chronic hypoxic respiratory failure due to pneumonia and COPD exacerbation On 2 L oxygen  at baseline S/p BiPAP in the ED, use BiPAP as needed Continue supplemental O2 admission and gradually wean down to baseline Continue breathing treatment Status post IV Solu-Medrol , continue prednisone  oral 8/3 CT chest: Right lower lobe bronchial wall thickening with areas of mucoid impaction, which could be sequela of aspiration or infection. Moderate partially loculated left pleural effusion, with compressive atelectasis in the dependent left lower lobe, not appreciably changed since prior study. 2. New trace free-flowing right pleural effusion with minimal right lower lobe atelectasis.  # Pleural effusion, left 8/6 s/p left thoracentesis 1.1 L fluid was tapped by IR Fluid studies consistent with partially exudative by LDH criteria. CXR postprocedure did not show any complications   # Community-acquired pneumonia Continue cefepime  and azithromycin  x 5 days Mucinex  600 twice daily Pleural effusion on left side most likely due to CAP CT  chest shows moderate left loculated pleural effusion 8/7 WBC count 7.1, procalcitonin negative.  No need of more antibiotics.  Monitor off antibiotics for now   # Acute blood loss anemia 8/4 Hb 7.0, transfuse 1 unit  of PRBC 8/5 Hb 8.1 >> 7.6 8/7 Hb 6.7, no active bleeding visible.  Transfuse 1 unit of PRBC. Monitor H&H and transfuse as needed    # Hypertension and HLD Continue losartan  25 twice daily, verapamil  360 mg, Lipitor 10 mg daily Monitor BP and titrate medications accordingly  # Constipation, due to SBO, now resolved 8/5 s/p Dulcolax suppository.  Continue current laxatives.  # Multiple skin tears right upper and lower extremity 8/5 active bleeding from right hand in between 3rd and 4th fingers, surgical Steri-Strip applied followed by pressure bandage.  RN was advised to place ice pack 8/6 bleeding stopped from the hand, dressing CDI Continue wound care Continue fall precaution Ambulate with assistance Follow PT OT eval   # Prediabetes -monitor poc   # Hx Bladder CA s/p TURBT 05/22/2019 s/p chemo and xrt -monitor of for hematuria , due to patient history    # Tobacco abuse -in remission    # Pleural nodule -followed by pulmonary Dr Isaiah   # Hyperkalemia Lokelma  10 g x 1 dose ordered on 8/6 Lokelma  10 g x 1 dose given on 8/7 Discontinued losartan  due to hyperkalemia  Monitor BMP daily Started low potassium diet  Body mass index is 17.58 kg/m.  Interventions:  Diet: CLD >>FLD >> regular diet 8/6 DVT Prophylaxis: s/p Hep gtt and Aggrastat  infusion. Start Lovenox  SQ nightly on 8/6  Advance goals of care discussion: DNR/DNI, Limited  Family Communication: family was present at bedside, at the time of interview.  The pt provided permission to discuss medical plan with the family. Opportunity was given to ask question and all questions were answered satisfactorily.   Disposition:  Pt is from Home, admitted with CAP, COPD, Resp Failure, critical LLE limb ischemia, s/p IV heparin  infusion and IV antibiotics.  Still patient is very sick, which precludes a safe discharge. Discharge to home versus SNF TBD after improvement, when stable, may need few days to  improve.  Subjective: No significant events overnight.  Complaining of right-sided chest pain while coughing so she cannot cough out phlegm because of pain.  Patient was advised to take as needed pain medications. Patient denied any active bleeding, hemoglobin low, she agreed for blood transfusion. Breathing is same, does not feel much improvement after thoracentesis but no complications.   Physical Exam: General: NAD, lying comfortably, mild SOB Appear in mild distress, affect slightly depressed Eyes: PERRLA ENT: Oral Mucosa Clear, moist  Neck: no JVD,  Cardiovascular: S1 and S2 Present, no Murmur,  Respiratory: Equal air entry bilaterally, mild bibasilar crackles, no wheezes. Abdomen: Bowel Sound present, Soft and no tenderness,  Skin: Multiple skin tears right arm and right leg, multiple bruises. Dressing intact.   Extremities: LLE: Palpable pulse and warm to touch, No Pedal edema, mild tenderness.  Chronic pigmentation Neurologic: without any new focal findings Gait not checked due to patient safety concerns  Vitals:   03/19/24 0949 03/19/24 1011 03/19/24 1241 03/19/24 1330  BP: (!) 132/55 129/60 138/61 (!) 131/57  Pulse: (!) 102 100 (!) 102 99  Resp: 19 19 20 19   Temp: 98.1 F (36.7 C) 98.5 F (36.9 C) 98 F (36.7 C) 98.2 F (36.8 C)  TempSrc: Oral Oral  Oral  SpO2: 100% 100% 99% 99%  Weight:  Height:        Intake/Output Summary (Last 24 hours) at 03/19/2024 1514 Last data filed at 03/19/2024 1436 Gross per 24 hour  Intake 540 ml  Output 1050 ml  Net -510 ml   Filed Weights   03/14/24 0711 03/14/24 2142 03/16/24 0924  Weight: 40.4 kg 42.2 kg 42.2 kg    Data Reviewed: I have personally reviewed and interpreted daily labs, tele strips, imagings as discussed above. I reviewed all nursing notes, pharmacy notes, vitals, pertinent old records I have discussed plan of care as described above with RN and patient/family.  CBC: Recent Labs  Lab 03/15/24 0903  03/16/24 9386 03/16/24 1603 03/17/24 0443 03/17/24 1649 03/18/24 0708 03/19/24 0312  WBC 6.5 9.8  --  9.2  --  10.2 7.1  HGB 9.1* 7.5* 7.0* 8.1* 7.6* 8.0* 6.7*  HCT 28.4* 23.5* 21.3* 25.4* 23.0* 24.6* 21.0*  MCV 95.9 95.5  --  90.1  --  89.5 91.3  PLT 272 267  --  218  --  203 200   Basic Metabolic Panel: Recent Labs  Lab 03/15/24 0903 03/16/24 0613 03/17/24 0443 03/18/24 0529 03/18/24 1252 03/19/24 0312  NA 140 142 137 138 139 137  K 4.4 4.4 4.7 5.6* 5.4* 5.4*  CL 105 106 104 105 104 103  CO2 26 24 26 27 28 27   GLUCOSE 149* 121* 134* 121* 137* 158*  BUN 22 24* 28* 33* 36* 37*  CREATININE 0.62 0.57 0.85 0.95 1.07* 0.91  CALCIUM  8.8* 8.7* 8.1* 8.5* 8.8* 8.5*  MG 1.9 2.0 2.0 2.3  --  2.0  PHOS 3.2 3.5 3.9 3.0  --  3.0    Studies: DG Chest Port 1 View Result Date: 03/18/2024 CLINICAL DATA:  Status post left thoracentesis. EXAM: PORTABLE CHEST 1 VIEW COMPARISON:  March 16, 2024. FINDINGS: The heart size and mediastinal contours are within normal limits. No acute pulmonary abnormality is noted. Stable probable scarring seen in right upper lobe. The visualized skeletal structures are unremarkable. IMPRESSION: No active disease. Electronically Signed   By: Lynwood Landy Raddle M.D.   On: 03/18/2024 16:19   US  THORACENTESIS ASP PLEURAL SPACE W/IMG GUIDE Result Date: 03/18/2024 INDICATION: 89 year old female with acute on chronic hypoxic respiratory failure due to pneumonia and COPD exacerbation, on 2 L O2 at baseline, with new onset left pleural effusion. IR was requested for diagnostic and therapeutic thoracentesis. EXAM: ULTRASOUND GUIDED DIAGNOSTIC AND THERAPEUTIC THORACENTESIS MEDICATIONS: 5 cc of 1% lidocaine  COMPLICATIONS: None immediate. PROCEDURE: An ultrasound guided thoracentesis was thoroughly discussed with the patient and questions answered. The benefits, risks, alternatives and complications were also discussed. The patient understands and wishes to proceed with the procedure.  Written consent was obtained. Ultrasound was performed to localize and mark an adequate pocket of fluid in the left chest. The area was then prepped and draped in the normal sterile fashion. 1% Lidocaine  was used for local anesthesia. Under ultrasound guidance a 6 Fr Safe-T-Centesis catheter was introduced. Thoracentesis was performed. The catheter was removed and a dressing applied. FINDINGS: A total of approximately 1.1 L of clear, straw-colored pleural fluid was removed. Samples were sent to the laboratory as requested by the clinical team. IMPRESSION: Successful ultrasound guided left thoracentesis yielding 1.1 L of pleural fluid. Procedure performed by Carlin Griffon, PA-C Electronically Signed   By: CHRISTELLA.  Shick M.D.   On: 03/18/2024 15:40     Scheduled Meds:  arformoterol   15 mcg Nebulization BID   aspirin  EC  81 mg Oral  Daily   atorvastatin   10 mg Oral Daily   bisacodyl   10 mg Oral QHS   clopidogrel   75 mg Oral Daily   enoxaparin  (LOVENOX ) injection  30 mg Subcutaneous QHS   feeding supplement  1 Container Oral TID BM   guaiFENesin   600 mg Oral BID   ipratropium-albuterol   3 mL Nebulization BID   methylPREDNISolone  (SOLU-MEDROL ) injection  40 mg Intravenous Q24H   pantoprazole   40 mg Oral BID AC   polyethylene glycol  17 g Oral Daily   verapamil   360 mg Oral Daily   Continuous Infusions:   PRN Meds: albuterol , morphine  injection, ondansetron  **OR** ondansetron  (ZOFRAN ) IV, oxyCODONE , phenol  Time spent: 55 minutes  Author: ELVAN SOR. MD [facility] 03/19/2024 3:14 PM  To reach On-call, see care teams to locate the attending and reach out to them via [email or web address]. If 7PM-7AM, please contact night-coverage If you still have difficulty reaching the attending provider, please page the [facility] (Director on Call) for [facility] on amion for assistance.

## 2024-03-19 NOTE — Plan of Care (Signed)

## 2024-03-19 NOTE — Progress Notes (Signed)
 Progress Note    03/19/2024 10:02 AM 3 Days Post-Op  Subjective:  Misty Ferguson is an 89 yo female now POD #3 from:   Procedure(s) Performed:             1.  Ultrasound guidance for vascular access right femoral artery             2.  Catheter placement into left superficial femoral artery and left profunda femoris artery from right femoral approach             3.  Aortogram and selective left lower extremity angiogram including separate profunda femoris angiogram and intervention             4.  Mechanical thrombectomy of the left SFA and popliteal arteries with the Rota Rex device             5.  Angioplasty of the left peroneal artery, tibioperoneal trunk, and distal popliteal artery with 3 mm diameter angioplasty balloon             6.  Stent placement to the left popliteal artery with 5 mm diameter by 5 cm length Viabahn stent             7.  Stent placement x 2 to the left SFA with 6 mm diameter by 15 cm length and 6 mm diameter by 10 cm length Viabahn stent             8.  Angioplasty of the left profunda femoris artery with 4 mm diameter by 4 cm length Lutonix drug-coated angioplasty balloon             9.  StarClose closure device right femoral artery   Patient is resting comfortably in bed this morning.  She endorses the pain to her left lower extremity has now subsided.  She also endorses that her left lower extremity feels much warmer this morning.  No other complaints overnight.  Vitals are remained stable.   Vitals:   03/19/24 0802 03/19/24 0949  BP: 139/63 (!) 132/55  Pulse: 84 (!) 102  Resp: 19 19  Temp: 98.7 F (37.1 C) 98.1 F (36.7 C)  SpO2: 98% 100%   Physical Exam: Cardiac:  RRR, normal S1-S2 no murmurs. Lungs: Lung sounds coarse on auscultation throughout.  Patient history of COPD on 2 L nasal cannula oxygen . Incisions: Right groin incision with dressing clean dry and intact.  No hematoma seroma to note. Extremities: Large skin tear to the lateral aspect of  the right knee bandaged.  Multiple areas of ecchymotic old bruising to bilateral lower extremities.  Also noted skin tear to the right elbow of the right second finger from her fall.  These are all dressed with bandages.  Positive Doppler pulses today to DP and PT her left lower extremity. Abdomen: Positive bowel sounds, nontender nondistended. Neurologic: Alert and oriented x 3, answers all questions and follows commands appropriately.  CBC    Component Value Date/Time   WBC 7.1 03/19/2024 0312   RBC 2.30 (L) 03/19/2024 0312   HGB 6.7 (L) 03/19/2024 0312   HGB 12.4 04/30/2019 1613   HCT 21.0 (L) 03/19/2024 0312   HCT 37.1 04/30/2019 1613   PLT 200 03/19/2024 0312   PLT 295 04/30/2019 1613   MCV 91.3 03/19/2024 0312   MCV 92 04/30/2019 1613   MCH 29.1 03/19/2024 0312   MCHC 31.9 03/19/2024 0312   RDW 18.2 (H) 03/19/2024 0312   RDW 11.5 (L) 04/30/2019 1613  LYMPHSABS 0.2 (L) 07/26/2022 0448   LYMPHSABS 1.4 04/30/2019 1613   MONOABS 0.4 07/26/2022 0448   EOSABS 0.0 07/26/2022 0448   EOSABS 0.2 04/30/2019 1613   BASOSABS 0.0 07/26/2022 0448   BASOSABS 0.1 04/30/2019 1613    BMET    Component Value Date/Time   NA 137 03/19/2024 0312   NA 141 06/10/2019 1324   K 5.4 (H) 03/19/2024 0312   K 4.1 07/23/2012 1536   CL 103 03/19/2024 0312   CO2 27 03/19/2024 0312   GLUCOSE 158 (H) 03/19/2024 0312   BUN 37 (H) 03/19/2024 0312   BUN 15 06/10/2019 1324   CREATININE 0.91 03/19/2024 0312   CREATININE 1.26 (H) 12/23/2012 1440   CALCIUM  8.5 (L) 03/19/2024 0312   GFRNONAA >60 03/19/2024 0312   GFRAA 62 06/10/2019 1324    INR    Component Value Date/Time   INR 1.0 03/14/2024 0709     Intake/Output Summary (Last 24 hours) at 03/19/2024 1002 Last data filed at 03/18/2024 2341 Gross per 24 hour  Intake 180 ml  Output 600 ml  Net -420 ml     Assessment/Plan:  89 y.o. female is s/p SEE ABOVE  3 Days Post-Op   PLAN Patient to continue on ASA 81 mg Daily, Plavix  75 mg daily and  Lipitor 10 mg daily.  Patient needs to be on ASA and Plavix  due to stent placement  Continue to monitor left lower extremity ischemia and check pulses. Right groin with dressing clean dry and intact.  No hematoma seroma to note.   DVT prophylaxis: Aspirin  81 mg daily Plavix  75 mg daily and Lipitor 10 mg daily.   Gwendlyn JONELLE Shank [facility] 03/19/2024 10:02 AM

## 2024-03-19 NOTE — Progress Notes (Signed)
 Physical Therapy Treatment Patient Details Name: Misty Ferguson MRN: [id number] DOB: [birth year] Today's Date: 03/19/2024   History of Present Illness Patient is a 89 year old female with respiratory distress, Acute on chronic critical limb ischemia left lower extremity s/p angiogram. Small bowel obstruction. PMH: severe COPD, bronchiectasis, acute respiratory failure with home 02    PT Comments  Pt was long sitting in bed on 4L o2 upon arrival. Supportive spouse present throughout. Pt is visually impaired however able to perform all desired task with increased time. SOB greatly limits session progression. She requires lengthy recovery to slow breathing with minimal activity. Scheduled for Thoracentesis later this date. Pt was able to exit bed, stand, and ambulate to recliner. SOB takes several minutes to resolve after minimal activity. She was completely unaware she had had BM in bed. Required assistance with hygiene care. Discussed post acute care needs. Pt still wanting to DC home however both spouse/patient are considering rehab if she remains severely limited. Acute PT will continue to follow and progress as able per current POC.      If plan is discharge home, recommend the following: A little help with walking and/or transfers;A little help with bathing/dressing/bathroom;Help with stairs or ramp for entrance;Assistance with cooking/housework     Equipment Recommendations  None recommended by PT       Precautions / Restrictions Precautions Precautions: Fall Recall of Precautions/Restrictions: Intact Restrictions Weight Bearing Restrictions Per Provider Order: No     Mobility  Bed Mobility Overal bed mobility: Needs Assistance Bed Mobility: Supine to Sit  Supine to sit: Min assist  General bed mobility comments: increased time + min assist to safely exit L side of bed. pt gets SOB with minimal activity.    Transfers Overall transfer level: Needs assistance Equipment used: Rolling  walker (2 wheels) Transfers: Sit to/from Stand Sit to Stand: Min assist, From elevated surface  General transfer comment: min assist to stand from slightly elevated bed height. Pt unaware she had had BM. Author assisted with hygiene care.    Ambulation/Gait Ambulation/Gait assistance: Contact guard assist, Min assist Assistive device: Rolling walker (2 wheels) Gait Pattern/deviations: Step-through pattern Gait velocity: decreased  General Gait Details: Pt very SOB with minimal activity. ON 4 L o2 throughout session. sao2 > 90% throughout. Pt required ~ 4 minutes to resolve SOB with vcing for relaxation an dbreathign techniques    Balance Overall balance assessment: Needs assistance Sitting-balance support: Feet supported Sitting balance-Leahy Scale: Fair     Standing balance support: Bilateral upper extremity supported, During functional activity, Reliant on assistive device for balance Standing balance-Leahy Scale: Poor Standing balance comment: external support required     Communication Communication Communication: No apparent difficulties  Cognition Arousal: Alert Behavior During Therapy: WFL for tasks assessed/performed   PT - Cognitive impairments: No apparent impairments    PT - Cognition Comments: Pt is A and O. Have severe vision deficits however did not impact session progression. pt's spouse present throughout session Following commands: Intact      Cueing Cueing Techniques: Verbal cues, Tactile cues    PT Goals (current goals can now be found in the care plan section) Acute Rehab PT Goals Patient Stated Goal: go home once safe to go    Frequency    Min 2X/week     Co-evaluation     PT goals addressed during session: Mobility/safety with mobility;Balance;Proper use of DME;Strengthening/ROM        AM-PAC PT 6 Clicks Mobility   Outcome Measure  Help needed turning from your back to your side while in a flat bed without using bedrails?: A  Little Help needed moving from lying on your back to sitting on the side of a flat bed without using bedrails?: A Little Help needed moving to and from a bed to a chair (including a wheelchair)?: A Lot Help needed standing up from a chair using your arms (e.g., wheelchair or bedside chair)?: A Lot Help needed to walk in hospital room?: A Lot Help needed climbing 3-5 steps with a railing? : A Lot 6 Click Score: 14    End of Session   Activity Tolerance: Patient tolerated treatment well;Patient limited by fatigue Patient left: in chair;with call bell/phone within reach;with chair alarm set Nurse Communication: Mobility status PT Visit Diagnosis: Muscle weakness (generalized) (M62.81);Unsteadiness on feet (R26.81)     Time:  -     Charges:                            Rankin Essex PTA 03/19/24, 9:07 AM

## 2024-03-19 NOTE — Progress Notes (Signed)
 [facility]- General Surgery  SURGICAL PROGRESS NOTE  Hospital Day(s): 5.    Interval History:  Patient states she feels well overall. Since her thoracentesis yesterday afternoon, she has noticed improvement with her breathing.  Patient has also tolerated soft diet. States she had small portions yesterday but is ready for big breakfast. Denies any nausea or vomiting. She is having regular bowel movements.  Denies any abdominal discomfort  Vital signs in last 24 hours: [min-max] current  Temp:  [97.3 F (36.3 C)-98.7 F (37.1 C)] 98.7 F (37.1 C) (08/07 0802) Pulse Rate:  [78-97] 84 (08/07 0802) Resp:  [18-20] 19 (08/07 0802) BP: (92-139)/(38-63) 139/63 (08/07 0802) SpO2:  [96 %-100 %] 98 % (08/07 0802)     Height: 5' 1 (154.9 cm) Weight: 42.2 kg BMI (Calculated): 17.59   Intake/Output last 2 shifts:  08/06 0701 - 08/07 0700 In: 540 [P.O.:540] Out: 600 [Urine:600]   Physical Exam:  Constitutional: alert, cooperative and no distress  Respiratory: breathing non-labored at rest, 3L [place] Cardiovascular: regular rate and sinus rhythm  Gastrointestinal: soft, non-tender, and non-distended  Labs:     Latest Ref Rng & Units 03/19/2024    3:12 AM 03/18/2024    7:08 AM 03/17/2024    4:49 PM  CBC  WBC 4.0 - 10.5 K/uL 7.1  10.2    Hemoglobin 12.0 - 15.0 g/dL 6.7  8.0  7.6   Hematocrit 36.0 - 46.0 % 21.0  24.6  23.0   Platelets 150 - 400 K/uL 200  203        Latest Ref Rng & Units 03/19/2024    3:12 AM 03/18/2024   12:52 PM 03/18/2024    5:29 AM  CMP  Glucose 70 - 99 mg/dL 841  862  878   BUN 8 - 23 mg/dL 37  36  33   Creatinine 0.44 - 1.00 mg/dL 9.08  8.92  9.04   Sodium 135 - 145 mmol/L 137  139  138   Potassium 3.5 - 5.1 mmol/L 5.4  5.4  5.6   Chloride 98 - 111 mmol/L 103  104  105   CO2 22 - 32 mmol/L 27  28  27    Calcium  8.9 - 10.3 mg/dL 8.5  8.8  8.5   Total Protein 6.5 - 8.1 g/dL 4.6     Total Bilirubin 0.0 - 1.2 mg/dL 0.7     Alkaline Phos 38 - 126 U/L 91     AST 15 - 41  U/L 107     ALT 0 - 44 U/L 226       Imaging studies: No new pertinent imaging studies   Assessment/Plan:  89 y.o. female with small bowel obstruction, Day 5, complicated by pertinent comorbidities including PAD with symptoms of left leg, severe COPD, chronic bronchiectasis, chronic hypoxic respiratory failure with home O2, pleural nodule, HTN, HDL, history of bladder CA and prediabetes.   Small bowel obstruction -resolved             - Tolerated soft diet with no complications and is having regular bowel movements.  SBO has likely resolved at this point. No surgical intervention needed. With patient's history of constipation, will recommend to continue MiraLAX  once daily. Surgery will sign off. Will still be available for any question.   Hypoxic respiratory failure/pneumonia/COPD exacerbation/pleural effusions              -Managed by medicine team              -  Now on 3L [place] (2L of O2 at baseline)  -Has completed course of IV abx              -Had thoracentesis done yesterday             -Continue to follow their recommendations    Acute on chronic limb ischemia of left lower leg              - Managed by vascular team              - Had left lower extremity angiography om 03/16/24 - stent placement x2             - Will continue dual antiplatelet therapy with aspirin  and plavix              - Continue to follow their recommendations   -- Shanon Becvar Barrientos PA-C

## 2024-03-19 NOTE — Progress Notes (Signed)
 PHARMACIST - PHYSICIAN COMMUNICATION  DR:   Von  CONCERNING: IV to Oral Route Change Policy  RECOMMENDATION: This patient is receiving Pantoprazole  by the intravenous route.  Based on criteria approved by the Pharmacy and Therapeutics Committee, the intravenous medication(s) is/are being converted to the equivalent oral dose form(s).   DESCRIPTION: These criteria include: The patient is eating (either orally or via tube) and/or has been taking other orally administered medications for a least 24 hours The patient has no evidence of active gastrointestinal bleeding or impaired GI absorption (gastrectomy, short bowel, patient on TNA or NPO).   Misty Ferguson PharmD, BCPS 03/19/2024 11:23 AM

## 2024-03-19 NOTE — Progress Notes (Signed)
 PT Cancellation Note  Patient Details Name: Misty Ferguson MRN: [id number] DOB: 04-08-1934   Cancelled Treatment:     PT attempt. Pt about to start unit of blood. Acute PT will return at a later time/date when more appropriate to participate.    Rankin KATHEE Essex 03/19/2024, 9:47 AM

## 2024-03-19 NOTE — Progress Notes (Signed)
 Occupational Therapy Treatment Patient Details Name: Misty Ferguson MRN: [id number] DOB: [birth year] Today's Date: 03/19/2024   History of present illness Patient is a 89 year old female with respiratory distress, Acute on chronic critical limb ischemia left lower extremity s/p angiogram. Small bowel obstruction. PMH: severe COPD, bronchiectasis, acute respiratory failure with home 02   OT comments  Pt is supine in bed on arrival. She does not mention pain during session, just states her LLE is very tender to touch. Pt performed bed mobility with CGA and use of bed features. Demo seated balance at EOB with supervision with increased time between tasks for 02 recovery. On 3L on entry and dropped to 87% with bed mobility, able to improve to 95% with PLB. Required Max A to comb hair which per report is baseline d/t her vision. Noted with incont BM and pt unaware on bed. She demo STS from EOB with Min A and continued to have BM, therefore SPT to [facility] performed with CGA and verb cues for hand placement. Pt sat on BSC for a while to continued going, linens were exchanged and peri-care performed with MAX A. Pt able to stand from [facility] with CGA at RW and ambulate ~5 ft to recliner using RW with CGA and cueing d/t low vision. Sp02 remained stable throughout session on 4L and pt was returned to 3L on OT exit. Edu to sit up in recliner at least 1 hour. Pt left with all needs in place and will cont to require skilled acute OT services to maximize her safety and IND to return to PLOF.       If plan is discharge home, recommend the following:  Help with stairs or ramp for entrance;A little help with bathing/dressing/bathroom;A little help with walking and/or transfers   Equipment Recommendations  BSC/3in1    Recommendations for Other Services      Precautions / Restrictions Precautions Precautions: Fall Recall of Precautions/Restrictions: Intact Restrictions Weight Bearing Restrictions Per Provider Order: No        Mobility Bed Mobility Overal bed mobility: Needs Assistance Bed Mobility: Supine to Sit     Supine to sit: HOB elevated, Used rails, Contact guard     General bed mobility comments: exited R side of bed (side she goes at home) with use of bed features, SOB with minimal activity dropped to 87% on 3L with quick improvmenet to 95% with PLB; good seated balance at EOB    Transfers Overall transfer level: Needs assistance Equipment used: Rolling walker (2 wheels) Transfers: Sit to/from Stand Sit to Stand: Min assist           General transfer comment: Min A to stand from EOB, CGA for SPT to [facility] and able to take ~5 steps forward to turn and sit in recliner using RW with CGA; verb cues for hand/feet placement d/t poor vision     Balance Overall balance assessment: Needs assistance Sitting-balance support: Feet supported Sitting balance-Leahy Scale: Good     Standing balance support: Bilateral upper extremity supported, During functional activity, Reliant on assistive device for balance Standing balance-Leahy Scale: Fair Standing balance comment: utilized RW for short distance mobility and SPT with CGA                           ADL either performed or assessed with clinical judgement   ADL Overall ADL's : Needs assistance/impaired     Grooming: Brushing hair;Sitting;Maximal assistance Grooming Details (indicate cue  type and reason): reports d/t vision her husband combs her hair at home                 Toilet Transfer: Contact guard assist;BSC/3in1;Stand-pivot;Rolling walker (2 wheels)   Toileting- Clothing Manipulation and Hygiene: Maximal assistance;Sit to/from stand;Sitting/lateral lean       Functional mobility during ADLs: Contact guard assist;Rolling walker (2 wheels)      Extremity/Trunk Assessment              Vision       Perception     Praxis     Communication Communication Communication: No apparent difficulties    Cognition Arousal: Alert Behavior During Therapy: WFL for tasks assessed/performed                                 Following commands: Intact        Cueing   Cueing Techniques: Verbal cues, Tactile cues  Exercises      Shoulder Instructions       General Comments      Pertinent Vitals/ Pain       Pain Assessment Pain Assessment: No/denies pain Pain Intervention(s): Monitored during session, Repositioned  Home Living                                          Prior Functioning/Environment              Frequency  Min 2X/week        Progress Toward Goals  OT Goals(current goals can now be found in the care plan section)  Progress towards OT goals: Progressing toward goals  Acute Rehab OT Goals Patient Stated Goal: go home OT Goal Formulation: With patient/family Time For Goal Achievement: 03/31/24 Potential to Achieve Goals: Fair  Plan      Co-evaluation                 AM-PAC OT 6 Clicks Daily Activity     Outcome Measure   Help from another person eating meals?: A Lot Help from another person taking care of personal grooming?: A Lot Help from another person toileting, which includes using toliet, bedpan, or urinal?: A Lot Help from another person bathing (including washing, rinsing, drying)?: A Lot Help from another person to put on and taking off regular upper body clothing?: A Lot Help from another person to put on and taking off regular lower body clothing?: A Lot 6 Click Score: 12    End of Session Equipment Utilized During Treatment: Oxygen ;Rolling walker (2 wheels)  OT Visit Diagnosis: Muscle weakness (generalized) (M62.81);Other abnormalities of gait and mobility (R26.89)   Activity Tolerance Patient tolerated treatment well   Patient Left with call bell/phone within reach;with family/visitor present;in chair   Nurse Communication Mobility status        Time: 8547-8447 OT Time Calculation  (min): 60 min  Charges: OT General Charges $OT Visit: 1 Visit OT Treatments $Self Care/Home Management : 53-67 mins  Misty Ferguson, OTR/L  03/19/24, 4:11 PM   Misty Ferguson 03/19/2024, 4:07 PM

## 2024-03-19 NOTE — Progress Notes (Signed)
 OT Cancellation Note  Patient Details Name: Misty Ferguson MRN: [id number] DOB: [birth year]   Cancelled Treatment:    Reason Eval/Treat Not Completed: Medical issues which prohibited therapy. Chart reviewed. Noted to have HgB 6.7, on arrival starting blood transfusion. Will hold and re-attempt as able.   Elston Slot, M.S. OTR/L  03/19/24, 11:48 AM  ascom [phone]

## 2024-03-20 DIAGNOSIS — J189 Pneumonia, unspecified organism: Secondary | ICD-10-CM | POA: Diagnosis not present

## 2024-03-20 DIAGNOSIS — I70222 Atherosclerosis of native arteries of extremities with rest pain, left leg: Secondary | ICD-10-CM | POA: Diagnosis not present

## 2024-03-20 LAB — HEPATIC FUNCTION PANEL
ALT: 248 U/L — ABNORMAL HIGH (ref 0–44)
AST: 99 U/L — ABNORMAL HIGH (ref 15–41)
Albumin: 2.6 g/dL — ABNORMAL LOW (ref 3.5–5.0)
Alkaline Phosphatase: 113 U/L (ref 38–126)
Bilirubin, Direct: <0.1 mg/dL (ref 0.0–0.2)
Total Bilirubin: 0.6 mg/dL (ref 0.0–1.2)
Total Protein: 4.8 g/dL — ABNORMAL LOW (ref 6.5–8.1)

## 2024-03-20 LAB — CBC
HCT: 28.4 % — ABNORMAL LOW (ref 36.0–46.0)
Hemoglobin: 9.6 g/dL — ABNORMAL LOW (ref 12.0–15.0)
MCH: 30.9 pg (ref 26.0–34.0)
MCHC: 33.8 g/dL (ref 30.0–36.0)
MCV: 91.3 fL (ref 80.0–100.0)
Platelets: 212 K/uL (ref 150–400)
RBC: 3.11 MIL/uL — ABNORMAL LOW (ref 3.87–5.11)
RDW: 16.2 % — ABNORMAL HIGH (ref 11.5–15.5)
WBC: 7.3 K/uL (ref 4.0–10.5)
nRBC: 0.3 % — ABNORMAL HIGH (ref 0.0–0.2)

## 2024-03-20 LAB — TYPE AND SCREEN
ABO/RH(D): AB POS
Antibody Screen: NEGATIVE
Unit division: 0
Unit division: 0

## 2024-03-20 LAB — BASIC METABOLIC PANEL WITH GFR
Anion gap: 9 (ref 5–15)
BUN: 41 mg/dL — ABNORMAL HIGH (ref 8–23)
CO2: 31 mmol/L (ref 22–32)
Calcium: 8.8 mg/dL — ABNORMAL LOW (ref 8.9–10.3)
Chloride: 98 mmol/L (ref 98–111)
Creatinine, Ser: 0.78 mg/dL (ref 0.44–1.00)
GFR, Estimated: >60 mL/min
Glucose, Bld: 140 mg/dL — ABNORMAL HIGH (ref 70–99)
Potassium: 4.8 mmol/L (ref 3.5–5.1)
Sodium: 138 mmol/L (ref 135–145)

## 2024-03-20 LAB — BPAM RBC
Blood Product Expiration Date: 202508162359
Blood Product Expiration Date: 202509062359
ISSUE DATE / TIME: 202508042234
ISSUE DATE / TIME: 202508070942
Unit Type and Rh: 600
Unit Type and Rh: 6200

## 2024-03-20 MED ORDER — TRAZODONE HCL 50 MG PO TABS
25.0000 mg | ORAL_TABLET | Freq: Every evening | ORAL | Status: DC | PRN
  Administered 2024-03-21: 25 mg via ORAL
  Filled 2024-03-20: qty 1

## 2024-03-20 MED ORDER — MELATONIN 5 MG PO TABS
5.0000 mg | ORAL_TABLET | Freq: Every day | ORAL | Status: DC
  Administered 2024-03-20 – 2024-03-21 (×2): 5 mg via ORAL
  Filled 2024-03-20 (×2): qty 1

## 2024-03-20 NOTE — Progress Notes (Addendum)
 Physical Therapy Treatment Patient Details Name: Misty Ferguson MRN: [id number] DOB: October 22, 1933 Today's Date: 03/20/2024   History of Present Illness Patient is a 89 year old female with respiratory distress, Acute on chronic critical limb ischemia left lower extremity s/p angiogram. Small bowel obstruction. PMH: severe COPD, bronchiectasis, acute respiratory failure with home 02    PT Comments  Pt is alert and oriented. Motivated and pleasant. Does have severe vision deficits at baseline and requires assistance. Pt was on 2.5 L o2. Sao2 remained > 92% on 2.5 however during ambulation, pt requested increased o2 supply to 4 L for comfort. Pt has home o2 already in place. She demonstrated safe abilities to exit bed, stand to RW, and tolerate ambulation to BR. Successful BM on BSC (placed over toilet) prior to ambulating to recliner. Pt ambulated ~ her baseline distance. 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 Pt seems close to her baseline abilities. Recommend HHPT at DC to continue to maximize her independence and safety while decreasing caregiver burden. DC recs remain appropriate.     If plan is discharge home, recommend the following: A little help with walking and/or transfers;A little help with bathing/dressing/bathroom;Help with stairs or ramp for entrance;Assistance with cooking/housework     Equipment Recommendations  None recommended by PT (pt endorses having all equipment at home already)       Precautions / Restrictions Precautions Precautions: Fall Recall of Precautions/Restrictions: Intact Restrictions Weight Bearing Restrictions Per Provider Order: No     Mobility  Bed Mobility Overal bed mobility: Needs Assistance Bed Mobility: Supine to Sit  Supine to sit: Min assist General bed mobility comments: Min assist to exit L side of bed. per spouse, he would have to assist her at home with this prior to admission     Transfers Overall transfer level: Needs assistance Equipment used: Rolling walker (2 wheels) Transfers: Sit to/from Stand Sit to Stand: Supervision General transfer comment: pt stood from lowest bed height and from BSC/placed over toilet without physical assistance    Ambulation/Gait Ambulation/Gait assistance: Contact guard assist, Supervision Gait Distance (Feet): 20 Feet Assistive device: Rolling walker (2 wheels) Gait Pattern/deviations: Step-through pattern Gait velocity: decreased  General Gait Details: Pt ambulated to/from BR with RW on 3 L o2. Pt gets SOB however sao2 > 92% throughout. pt requested increased O2. Did increase O2 to 4L with sao2 increasing to 97%    Balance Overall balance assessment: Needs assistance Sitting-balance support: Feet supported Sitting balance-Leahy Scale: Good     Standing balance support: Bilateral upper extremity supported, During functional activity, Reliant on assistive device for balance Standing balance-Leahy Scale: Fair Standing balance comment: reliant on RW. vision deficits make pt at higher risk of falls      Communication Communication Communication: No apparent difficulties  Cognition Arousal: Alert Behavior During Therapy: WFL for tasks assessed/performed   PT - Cognitive impairments: No apparent impairments      PT - Cognition Comments: Pt is A and O. Have severe vision deficits however did not impact session progression. pt's spouse present throughout session Following commands: Intact      Cueing Cueing Techniques: Verbal cues, Tactile cues     General Comments General comments (skin integrity, edema, etc.): discussed at length DC recommendation and expectations. pt has been incontient throughout adimission. encouraged use of depends/adult diapers at DC. spouse planning to order purewick system for home use      Pertinent Vitals/Pain Pain Assessment Pain Assessment:  No/denies pain     PT Goals (current goals  can now be found in the care plan section) Acute Rehab PT Goals Patient Stated Goal: go home once safe to go Progress towards PT goals: Progressing toward goals    Frequency    Min 2X/week       Co-evaluation     PT goals addressed during session: Mobility/safety with mobility;Balance;Proper use of DME;Strengthening/ROM        AM-PAC PT 6 Clicks Mobility   Outcome Measure  Help needed turning from your back to your side while in a flat bed without using bedrails?: A Little Help needed moving from lying on your back to sitting on the side of a flat bed without using bedrails?: A Little Help needed moving to and from a bed to a chair (including a wheelchair)?: A Little Help needed standing up from a chair using your arms (e.g., wheelchair or bedside chair)?: A Little Help needed to walk in hospital room?: A Little Help needed climbing 3-5 steps with a railing? : A Lot 6 Click Score: 17    End of Session Equipment Utilized During Treatment: Oxygen  (pt has home o2 already. Back to 2.5 L o2 at conclusion of session) Activity Tolerance: Patient tolerated treatment well;Patient limited by fatigue Patient left: in chair;with call bell/phone within reach;with chair alarm set Nurse Communication: Mobility status PT Visit Diagnosis: Muscle weakness (generalized) (M62.81);Unsteadiness on feet (R26.81)     Time: 8898-8855 PT Time Calculation (min) (ACUTE ONLY): 43 min  Charges:    $Gait Training: 8-22 mins $Therapeutic Activity: 23-37 mins PT General Charges $$ ACUTE PT VISIT: 1 Visit                     Rankin Essex PTA 03/20/24, 12:00 PM

## 2024-03-20 NOTE — Plan of Care (Signed)

## 2024-03-20 NOTE — Progress Notes (Signed)
 Right elbow, right hand and right knee drsg's all changed.

## 2024-03-20 NOTE — Progress Notes (Signed)
 [facility] Progress Note  Patient: Misty Ferguson    FMW:[id number]  DOA: 03/14/2024     Date of Service: the patient was seen and examined on 03/20/2024  Chief Complaint  Patient presents with   Respiratory Distress   Brief hospital course: Misty Ferguson is a 89 y.o. female with medical history significant of Severe COPD  , Chronic bronchiectasis , chronic hypoxic respiratory failure home O2 with exertion and at bedtime ,  Pleural nodule, HTN, PAD with symptoms predominantly in the left leg , HLD, Prediabetes, bladder CA s/p TURBT 05/22/2019 s/p chemo and xrt,tobacco abuse who presents to ED BIB EMS S/p call out for a fall. However on arrival EMS noted patient was in respiratory distress.  She was found sitting on toilet with multiple skin tears noted.  Her O2 sat was 85% . Of note patient is on home O2 with exertion and at bedtime but typically maintains sats well at rest on RA. Patient was treated with albuterol   and transported to ED. Of note if patient has any head trauma. Patient does not complaint of any HA.   ED Course:  Patient was diagnosed with AECOPD/CAP, s/p Bipap, transition to O2 via [place] further management as below.   LLE, no dopplerable pulse.  Seen by Dr. Marea vascular surgery and started heparin  IV infusion. [facility] was consulted for admission and further management as below.   Assessment and Plan:  # Acute on chronic critical limb ischemia left lower extremity Continue ASA 81 mg and Plavix  75 mg p.o. daily S/p Heparin  IV infusion, on 8/4 started Aggrastat  infusion after angiogram as per vascular surgery till 8/5 Vascular surgery consulted s/p angioplasty and stent inserted in left SFA, and left profunda femoris artery done on 8/4 8/5 continue DAPT, off anticoagulation  # Small bowel obstruction.  Resolved with conservative management 8/3 respiratory status got worse and patient was not feeling right, also having constipation, not passing gas.  Feeling abdominal pressure  and abdominal tenderness. KUB positive for small bowel obstruction CT a/p: Small-bowel obstruction, with dilated proximal small bowel measuring up to 3.6 cm. The exact point of obstruction is not identified, but the distal jejunum and ileum are decompressed. Moderate retained stool throughout the colon. General surgery consulted. 8/4 SBFT: Contrast material throughout the colon with decompressed small bowel. No current evidence for bowel obstruction. 8/4 SBO resolved, NGT clamped, started clear liquid diet as per general surgery. 8/5 NGT removed, FL diet started.  Patient moved bowels.  Dulcolax suppository was given.  Continue laxatives.   # Acute on chronic hypoxic respiratory failure due to pneumonia and COPD exacerbation On 2 L oxygen  at baseline S/p BiPAP in the ED, use BiPAP as needed Continue supplemental O2 admission and gradually wean down to baseline Continue breathing treatment Status post IV Solu-Medrol , continue prednisone  oral 8/3 CT chest: Right lower lobe bronchial wall thickening with areas of mucoid impaction, which could be sequela of aspiration or infection. Moderate partially loculated left pleural effusion, with compressive atelectasis in the dependent left lower lobe, not appreciably changed since prior study. 2. New trace free-flowing right pleural effusion with minimal right lower lobe atelectasis.  # Pleural effusion, left 8/6 s/p left thoracentesis 1.1 L fluid was tapped by IR Fluid studies consistent with partially exudative by LDH criteria. CXR postprocedure did not show any complications   # Community-acquired pneumonia Continue cefepime  and azithromycin  x 5 days Mucinex  600 twice daily Pleural effusion on left side most likely due to CAP CT  chest shows moderate left loculated pleural effusion 8/7 WBC count 7.1, procalcitonin negative.  No need of more antibiotics.  Monitor off antibiotics for now   # Acute blood loss anemia 8/4 Hb 7.0, transfuse 1 unit  of PRBC 8/5 Hb 8.1 >> 7.6 8/7 Hb 6.7, no active bleeding visible.  Transfuse 1 unit of PRBC. 8/8 Hb 9.6 Monitor H&H and transfuse as needed    # Hypertension and HLD Continue losartan  25 twice daily, verapamil  360 mg, Lipitor 10 mg daily Monitor BP and titrate medications accordingly  # Constipation, due to SBO, now resolved 8/5 s/p Dulcolax suppository.  Continue current laxatives.  # Multiple skin tears right upper and lower extremity 8/5 active bleeding from right hand in between 3rd and 4th fingers, surgical Steri-Strip applied followed by pressure bandage.  RN was advised to place ice pack 8/6 bleeding stopped from the hand, dressing CDI Continue wound care Continue fall precaution Ambulate with assistance PT OT eval done, recommended home health PT/OT/RN   # Prediabetes -monitor poc   # Hx Bladder CA s/p TURBT 05/22/2019 s/p chemo and xrt -monitor of for hematuria , due to patient history    # Tobacco abuse -in remission    # Pleural nodule -followed by pulmonary Dr Isaiah   # Hyperkalemia: Resolved  Lokelma  10 g x 1 dose ordered on 8/6 Lokelma  10 g x 1 dose given on 8/7 Discontinued losartan  due to hyperkalemia  Monitor BMP daily Started low potassium diet  Body mass index is 17.58 kg/m.  Interventions:  Diet: CLD >>FLD >> regular diet 8/6 DVT Prophylaxis: s/p Hep gtt and Aggrastat  infusion. Start Lovenox  SQ nightly on 8/6  Advance goals of care discussion: DNR/DNI, Limited  Family Communication: family was present at bedside, at the time of interview.  The pt provided permission to discuss medical plan with the family. Opportunity was given to ask question and all questions were answered satisfactorily.   Disposition:  Pt is from Home, admitted with CAP, COPD, Resp Failure, critical LLE limb ischemia, s/p IV heparin  infusion and IV antibiotics.  Still patient is very sick, which precludes a safe discharge. Discharge to home with [facility] services, when stable,  may need 1-2 more days to improve  Subjective: No significant events overnight.  Still has mild shortness of breath, passing gas and diet is improving.  No active bleeding. Patient is motivated and working with physical therapy. Patient is still not physically strong, she thinks she may need 1-2 more days to stay in the hospital and then probably will go home.   Physical Exam: General: NAD, lying comfortably, mild SOB Appear in mild distress, affect slightly depressed Eyes: PERRLA ENT: Oral Mucosa Clear, moist  Neck: no JVD,  Cardiovascular: S1 and S2 Present, no Murmur,  Respiratory: Equal air entry bilaterally, mild bibasilar crackles, no wheezes. Abdomen: Bowel Sound present, Soft and no tenderness,  Skin: Multiple skin tears right arm and right leg, multiple bruises. Dressing intact.   Extremities: LLE: Palpable pulse and warm to touch, No Pedal edema, mild tenderness.  Chronic pigmentation Neurologic: without any new focal findings Gait not checked due to patient safety concerns  Vitals:   03/20/24 0432 03/20/24 0819 03/20/24 1208 03/20/24 1536  BP: (!) 157/66 139/60 (!) 110/59 (!) 119/58  Pulse: 78 84 83 62  Resp: 18 18 18 18   Temp: 97.7 F (36.5 C) 97.7 F (36.5 C) 98 F (36.7 C) 98.1 F (36.7 C)  TempSrc:      SpO2: 99%  99% 96% 98%  Weight:      Height:        Intake/Output Summary (Last 24 hours) at 03/20/2024 1739 Last data filed at 03/20/2024 1214 Gross per 24 hour  Intake 717 ml  Output 600 ml  Net 117 ml   Filed Weights   03/14/24 0711 03/14/24 2142 03/16/24 0924  Weight: 40.4 kg 42.2 kg 42.2 kg    Data Reviewed: I have personally reviewed and interpreted daily labs, tele strips, imagings as discussed above. I reviewed all nursing notes, pharmacy notes, vitals, pertinent old records I have discussed plan of care as described above with RN and patient/family.  CBC: Recent Labs  Lab 03/16/24 0613 03/16/24 1603 03/17/24 0443 03/17/24 1649  03/18/24 0708 03/19/24 0312 03/19/24 1655 03/20/24 0449  WBC 9.8  --  9.2  --  10.2 7.1  --  7.3  HGB 7.5*   < > 8.1* 7.6* 8.0* 6.7* 10.6* 9.6*  HCT 23.5*   < > 25.4* 23.0* 24.6* 21.0* 31.2* 28.4*  MCV 95.5  --  90.1  --  89.5 91.3  --  91.3  PLT 267  --  218  --  203 200  --  212   < > = values in this interval not displayed.   Basic Metabolic Panel: Recent Labs  Lab 03/15/24 0903 03/16/24 9386 03/17/24 0443 03/18/24 0529 03/18/24 1252 03/19/24 0312 03/20/24 0449  NA 140 142 137 138 139 137 138  K 4.4 4.4 4.7 5.6* 5.4* 5.4* 4.8  CL 105 106 104 105 104 103 98  CO2 26 24 26 27 28 27 31   GLUCOSE 149* 121* 134* 121* 137* 158* 140*  BUN 22 24* 28* 33* 36* 37* 41*  CREATININE 0.62 0.57 0.85 0.95 1.07* 0.91 0.78  CALCIUM  8.8* 8.7* 8.1* 8.5* 8.8* 8.5* 8.8*  MG 1.9 2.0 2.0 2.3  --  2.0  --   PHOS 3.2 3.5 3.9 3.0  --  3.0  --     Studies: No results found.    Scheduled Meds:  arformoterol   15 mcg Nebulization BID   aspirin  EC  81 mg Oral Daily   atorvastatin   10 mg Oral Daily   bisacodyl   10 mg Oral QHS   clopidogrel   75 mg Oral Daily   enoxaparin  (LOVENOX ) injection  30 mg Subcutaneous QHS   feeding supplement  1 Container Oral TID BM   guaiFENesin   600 mg Oral BID   ipratropium-albuterol   3 mL Nebulization BID   pantoprazole   40 mg Oral BID AC   polyethylene glycol  17 g Oral Daily   verapamil   360 mg Oral Daily   Continuous Infusions:   PRN Meds: albuterol , morphine  injection, ondansetron  **OR** ondansetron  (ZOFRAN ) IV, oxyCODONE , phenol  Time spent: 55 minutes  Author: ELVAN SOR. MD [facility] 03/20/2024 5:39 PM  To reach On-call, see care teams to locate the attending and reach out to them via [email or web address]. If 7PM-7AM, please contact night-coverage If you still have difficulty reaching the attending provider, please page the [facility] (Director on Call) for [facility] on amion for assistance.

## 2024-03-21 ENCOUNTER — Inpatient Hospital Stay

## 2024-03-21 DIAGNOSIS — J189 Pneumonia, unspecified organism: Secondary | ICD-10-CM | POA: Diagnosis not present

## 2024-03-21 DIAGNOSIS — J9621 Acute and chronic respiratory failure with hypoxia: Secondary | ICD-10-CM

## 2024-03-21 DIAGNOSIS — K56609 Unspecified intestinal obstruction, unspecified as to partial versus complete obstruction: Secondary | ICD-10-CM | POA: Insufficient documentation

## 2024-03-21 DIAGNOSIS — I70222 Atherosclerosis of native arteries of extremities with rest pain, left leg: Secondary | ICD-10-CM | POA: Diagnosis not present

## 2024-03-21 LAB — BASIC METABOLIC PANEL WITH GFR
Anion gap: 8 (ref 5–15)
BUN: 39 mg/dL — ABNORMAL HIGH (ref 8–23)
CO2: 30 mmol/L (ref 22–32)
Calcium: 8.9 mg/dL (ref 8.9–10.3)
Chloride: 99 mmol/L (ref 98–111)
Creatinine, Ser: 0.61 mg/dL (ref 0.44–1.00)
GFR, Estimated: >60 mL/min (ref >60)
Glucose, Bld: 124 mg/dL — ABNORMAL HIGH (ref 70–99)
Potassium: 4.3 mmol/L (ref 3.5–5.1)
Sodium: 137 mmol/L (ref 135–145)

## 2024-03-21 LAB — CBC
HCT: 30.4 % — ABNORMAL LOW (ref 36.0–46.0)
Hemoglobin: 10 g/dL — ABNORMAL LOW (ref 12.0–15.0)
MCH: 30.4 pg (ref 26.0–34.0)
MCHC: 32.9 g/dL (ref 30.0–36.0)
MCV: 92.4 fL (ref 80.0–100.0)
Platelets: 248 K/uL (ref 150–400)
RBC: 3.29 MIL/uL — ABNORMAL LOW (ref 3.87–5.11)
RDW: 16 % — ABNORMAL HIGH (ref 11.5–15.5)
WBC: 11.4 K/uL — ABNORMAL HIGH (ref 4.0–10.5)
nRBC: 0 % (ref 0.0–0.2)

## 2024-03-21 LAB — HEPATIC FUNCTION PANEL
ALT: 202 U/L — ABNORMAL HIGH (ref 0–44)
AST: 73 U/L — ABNORMAL HIGH (ref 15–41)
Albumin: 2.7 g/dL — ABNORMAL LOW (ref 3.5–5.0)
Alkaline Phosphatase: 101 U/L (ref 38–126)
Bilirubin, Direct: 0.1 mg/dL (ref 0.0–0.2)
Indirect Bilirubin: 0.6 mg/dL (ref 0.3–0.9)
Total Bilirubin: 0.7 mg/dL (ref 0.0–1.2)
Total Protein: 4.9 g/dL — ABNORMAL LOW (ref 6.5–8.1)

## 2024-03-21 MED ORDER — SUCRALFATE 1 G PO TABS
1.0000 g | ORAL_TABLET | Freq: Three times a day (TID) | ORAL | Status: DC
  Administered 2024-03-21 – 2024-03-22 (×4): 1 g via ORAL
  Filled 2024-03-21 (×4): qty 1

## 2024-03-21 MED ORDER — SENNOSIDES-DOCUSATE SODIUM 8.6-50 MG PO TABS
2.0000 | ORAL_TABLET | Freq: Two times a day (BID) | ORAL | Status: DC
  Administered 2024-03-21 (×2): 2 via ORAL
  Filled 2024-03-21 (×2): qty 2

## 2024-03-21 MED ORDER — QUETIAPINE FUMARATE 25 MG PO TABS
25.0000 mg | ORAL_TABLET | Freq: Every day | ORAL | Status: DC
  Administered 2024-03-21: 25 mg via ORAL
  Filled 2024-03-21: qty 1

## 2024-03-21 NOTE — Plan of Care (Signed)
  Problem: Education: Goal: Knowledge of General Education information will improve Description: Including pain rating scale, medication(s)/side effects and non-pharmacologic comfort measures Outcome: Progressing   Problem: Health Behavior/Discharge Planning: Goal: Ability to manage health-related needs will improve Outcome: Progressing   Problem: Clinical Measurements: Goal: Ability to maintain clinical measurements within normal limits will improve Outcome: Progressing Goal: Will remain free from infection Outcome: Progressing Goal: Diagnostic test results will improve Outcome: Progressing Goal: Respiratory complications will improve Outcome: Progressing Goal: Cardiovascular complication will be avoided Outcome: Progressing   Problem: Activity: Goal: Risk for activity intolerance will decrease Outcome: Progressing   Problem: Nutrition: Goal: Adequate nutrition will be maintained Outcome: Progressing   Problem: Coping: Goal: Level of anxiety will decrease Outcome: Progressing   Problem: Elimination: Goal: Will not experience complications related to bowel motility Outcome: Progressing Goal: Will not experience complications related to urinary retention Outcome: Progressing   Problem: Pain Managment: Goal: General experience of comfort will improve and/or be controlled Outcome: Progressing   Problem: Safety: Goal: Ability to remain free from injury will improve Outcome: Progressing   Problem: Education: Goal: Knowledge of disease or condition will improve Outcome: Progressing Goal: Knowledge of the prescribed therapeutic regimen will improve Outcome: Progressing Goal: Individualized Educational Video(s) Outcome: Progressing   Problem: Activity: Goal: Ability to tolerate increased activity will improve Outcome: Progressing Goal: Will verbalize the importance of balancing activity with adequate rest periods Outcome: Progressing   Problem: Respiratory: Goal:  Ability to maintain a clear airway will improve Outcome: Progressing Goal: Levels of oxygenation will improve Outcome: Progressing Goal: Ability to maintain adequate ventilation will improve Outcome: Progressing

## 2024-03-21 NOTE — Hospital Course (Signed)
 Misty Ferguson is a 89 y.o. female with medical history significant of Severe COPD  , Chronic bronchiectasis , chronic hypoxic respiratory failure home O2 with exertion and at bedtime ,  Pleural nodule, HTN, PAD with symptoms predominantly in the left leg , HLD, Prediabetes, bladder CA s/p TURBT 05/22/2019 s/p chemo and xrt,tobacco abuse who presents to ED BIB EMS S/p call out for a fall. However on arrival EMS noted patient was in respiratory distress.  She was found sitting on toilet with multiple skin tears noted.  Her O2 sat was 85%  Patient was diagnosed with AECOPD/CAP, s/p Bipap, transition to O2 via [place] further management as below.   LLE, no dopplerable pulse.  Seen by Dr. Marea vascular surgery and started heparin  IV infusion.s/p angioplasty and stent inserted in left SFA, and left profunda femoris artery done on 8/4 8/5

## 2024-03-21 NOTE — Plan of Care (Signed)

## 2024-03-21 NOTE — Progress Notes (Signed)
 Progress Note   Patient: Misty Ferguson FMW:[id number] DOB: 10-09-1933 DOA: 03/14/2024     7 DOS: the patient was seen and examined on 03/21/2024   Brief hospital course: Misty Ferguson is a 89 y.o. female with medical history significant of Severe COPD  , Chronic bronchiectasis , chronic hypoxic respiratory failure home O2 with exertion and at bedtime ,  Pleural nodule, HTN, PAD with symptoms predominantly in the left leg , HLD, Prediabetes, bladder CA s/p TURBT 05/22/2019 s/p chemo and xrt,tobacco abuse who presents to ED BIB EMS S/p call out for a fall. However on arrival EMS noted patient was in respiratory distress.  She was found sitting on toilet with multiple skin tears noted.  Her O2 sat was 85%  Patient was diagnosed with AECOPD/CAP, s/p Bipap, transition to O2 via [place] further management as below.   LLE, no dopplerable pulse.  Seen by Dr. Marea vascular surgery and started heparin  IV infusion.s/p angioplasty and stent inserted in left SFA, and left profunda femoris artery done on 8/4 8/5   Active Problems:   CAP (community acquired pneumonia)   Critical limb ischemia of left lower extremity (HCC)   Assessment and Plan: # Acute on chronic critical limb ischemia left lower extremity Continue ASA 81 mg and Plavix  75 mg p.o. daily S/p Heparin  IV infusion, on 8/4 started Aggrastat  infusion after angiogram as per vascular surgery till 8/5 Vascular surgery consulted s/p angioplasty and stent inserted in left SFA, and left profunda femoris artery done on 8/4 8/5 continue DAPT, off anticoagulation. No acute issues.   # Small bowel obstruction.  Resolved with conservative management 8/3 respiratory status got worse and patient was not feeling right, also having constipation, not passing gas.  Feeling abdominal pressure and abdominal tenderness. KUB positive for small bowel obstruction CT a/p: Small-bowel obstruction, with dilated proximal small bowel measuring up to 3.6 cm. The exact point of  obstruction is not identified, but the distal jejunum and ileum are decompressed. Moderate retained stool throughout the colon. General surgery consulted. 8/4 SBFT: Contrast material throughout the colon with decompressed small bowel. No current evidence for bowel obstruction. 8/4 SBO resolved since.  Patient still has some constipation, complaining of abdominal pain, currently taking MiraLAX , added senna.  Continue Protonix , also added sucralfate .     # Acute on chronic hypoxic respiratory failure due to pneumonia and COPD exacerbation On 2 L oxygen  at baseline S/p BiPAP in the ED, use BiPAP as needed Continue supplemental O2 admission and gradually wean down to baseline Continue breathing treatment Status post IV Solu-Medrol , continue prednisone  oral 8/3 CT chest: Right lower lobe bronchial wall thickening with areas of mucoid impaction, which could be sequela of aspiration or infection. Moderate partially loculated left pleural effusion, with compressive atelectasis in the dependent left lower lobe, not appreciably changed since prior study. 2. New trace free-flowing right pleural effusion with minimal right lower lobe atelectasis.\ Condition improved, antibiotic completed.   # Pleural effusion, left 8/6 s/p left thoracentesis 1.1 L fluid was tapped by IR Fluid studies consistent with partially exudative by LDH criteria.  No evidence of infection. CXR postprocedure did not show any complications    # Community-acquired pneumonia Completed cefepime  and azithromycin  x 5 days   # Acute blood loss anemia She was thought over 2 units of blood transfusion, hemoglobin has been stable since.  Insomnia. Patient could not sleep for the last 2 or 3 days, feel pretty bad.  Will continue melatonin.  Discontinue trazodone , added  Seroquel .  Nonalcoholic steatohepatitis. Patient had elevated liver enzymes, which is gradually improving.  Due to concern of metastatic cancer, ultrasound was  obtained, showed steatohepatitis without neoplasm.  Severe protein calorie malnutrition. Patient appears severely malnourished, continue protein supplement.    Subjective:  Patient complaining of upper stomach pain, appeared had a small bowel movement yesterday.  No nausea vomiting. She has severe insomnia, has not been able to sleep for the last 2 or 3 days.  Physical Exam: Vitals:   03/21/24 0323 03/21/24 0819 03/21/24 0849 03/21/24 1151  BP: (!) 155/69  (!) 147/70 (!) 101/54  Pulse: 75  90 86  Resp: 19  20 20   Temp: 98.3 F (36.8 C)  98.2 F (36.8 C) 98.1 F (36.7 C)  TempSrc: Oral  Oral Oral  SpO2: 97% 96% 98% 99%  Weight:      Height:       General exam: Appears calm and comfortable, severely malnourished. Respiratory system: Clear to auscultation. Respiratory effort normal. Cardiovascular system: S1 & S2 heard, RRR. No JVD, murmurs, rubs, gallops or clicks. No pedal edema. Gastrointestinal system: Abdomen is nondistended, soft and nontender. No organomegaly or masses felt. Normal bowel sounds heard. Central nervous system: Alert and oriented. No focal neurological deficits. Extremities: Significant muscle atrophy Skin: No rashes, lesions or ulcers Psychiatry: Judgement and insight appear normal. Mood & affect appropriate.    Data Reviewed:  Ultrasound x-ray, lab results reviewed.  Family Communication: Husband updated at bedside.  Disposition: Status is: Inpatient Remains inpatient appropriate because: Severity of disease, Likely discharge home with home care tomorrow if symptom improves.     Time spent: 50 minutes  Author: Murvin Mana, MD 03/21/2024 2:32 PM  For on call review [email or web address].

## 2024-03-22 ENCOUNTER — Other Ambulatory Visit: Payer: Self-pay

## 2024-03-22 DIAGNOSIS — I70222 Atherosclerosis of native arteries of extremities with rest pain, left leg: Secondary | ICD-10-CM | POA: Diagnosis not present

## 2024-03-22 DIAGNOSIS — J189 Pneumonia, unspecified organism: Secondary | ICD-10-CM | POA: Diagnosis not present

## 2024-03-22 DIAGNOSIS — J9621 Acute and chronic respiratory failure with hypoxia: Secondary | ICD-10-CM | POA: Diagnosis not present

## 2024-03-22 DIAGNOSIS — K56609 Unspecified intestinal obstruction, unspecified as to partial versus complete obstruction: Secondary | ICD-10-CM | POA: Diagnosis not present

## 2024-03-22 LAB — BASIC METABOLIC PANEL WITH GFR
Anion gap: 8 (ref 5–15)
BUN: 30 mg/dL — ABNORMAL HIGH (ref 8–23)
CO2: 31 mmol/L (ref 22–32)
Calcium: 8.3 mg/dL — ABNORMAL LOW (ref 8.9–10.3)
Chloride: 99 mmol/L (ref 98–111)
Creatinine, Ser: 0.75 mg/dL (ref 0.44–1.00)
GFR, Estimated: >60 mL/min (ref >60)
Glucose, Bld: 102 mg/dL — ABNORMAL HIGH (ref 70–99)
Potassium: 3.9 mmol/L (ref 3.5–5.1)
Sodium: 138 mmol/L (ref 135–145)

## 2024-03-22 LAB — CBC
HCT: 29.6 % — ABNORMAL LOW (ref 36.0–46.0)
Hemoglobin: 9.4 g/dL — ABNORMAL LOW (ref 12.0–15.0)
MCH: 30.5 pg (ref 26.0–34.0)
MCHC: 31.8 g/dL (ref 30.0–36.0)
MCV: 96.1 fL (ref 80.0–100.0)
Platelets: 229 K/uL (ref 150–400)
RBC: 3.08 MIL/uL — ABNORMAL LOW (ref 3.87–5.11)
RDW: 15.9 % — ABNORMAL HIGH (ref 11.5–15.5)
WBC: 7.1 K/uL (ref 4.0–10.5)
nRBC: 0 % (ref 0.0–0.2)

## 2024-03-22 LAB — HEPATIC FUNCTION PANEL
ALT: 143 U/L — ABNORMAL HIGH (ref 0–44)
AST: 48 U/L — ABNORMAL HIGH (ref 15–41)
Albumin: 2.3 g/dL — ABNORMAL LOW (ref 3.5–5.0)
Alkaline Phosphatase: 87 U/L (ref 38–126)
Bilirubin, Direct: 0.1 mg/dL (ref 0.0–0.2)
Indirect Bilirubin: 0.8 mg/dL (ref 0.3–0.9)
Total Bilirubin: 0.9 mg/dL (ref 0.0–1.2)
Total Protein: 4.3 g/dL — ABNORMAL LOW (ref 6.5–8.1)

## 2024-03-22 LAB — BODY FLUID CULTURE W GRAM STAIN
Culture: NO GROWTH
Gram Stain: NONE SEEN

## 2024-03-22 MED ORDER — MELATONIN 5 MG PO TABS: 5.0000 mg | ORAL_TABLET | Freq: Every day | ORAL | 0 refills | Status: AC

## 2024-03-22 MED ORDER — POLYETHYLENE GLYCOL 3350 17 G PO PACK: 17.0000 g | PACK | Freq: Every day | ORAL | 0 refills | Status: AC

## 2024-03-22 MED ORDER — PANTOPRAZOLE SODIUM 40 MG PO TBEC
40.0000 mg | DELAYED_RELEASE_TABLET | Freq: Every day | ORAL | 0 refills | Status: DC
  Filled 2024-03-22: qty 14, 14d supply, fill #0

## 2024-03-22 MED ORDER — SENNOSIDES-DOCUSATE SODIUM 8.6-50 MG PO TABS: 1.0000 | ORAL_TABLET | Freq: Two times a day (BID) | ORAL | 0 refills | Status: AC | PRN

## 2024-03-22 MED ORDER — POLYETHYLENE GLYCOL 3350 17 G PO PACK
17.0000 g | PACK | Freq: Every day | ORAL | 0 refills | Status: DC
  Filled 2024-03-22: qty 14, 14d supply, fill #0

## 2024-03-22 MED ORDER — QUETIAPINE FUMARATE 25 MG PO TABS
25.0000 mg | ORAL_TABLET | Freq: Every day | ORAL | 0 refills | Status: DC
Start: 2024-03-22 — End: 2024-03-22
  Filled 2024-03-22: qty 30, 30d supply, fill #0

## 2024-03-22 MED ORDER — QUETIAPINE FUMARATE 25 MG PO TABS: 25.0000 mg | ORAL_TABLET | Freq: Every day | ORAL | 0 refills | Status: DC

## 2024-03-22 MED ORDER — SENNOSIDES-DOCUSATE SODIUM 8.6-50 MG PO TABS
1.0000 | ORAL_TABLET | Freq: Two times a day (BID) | ORAL | 0 refills | Status: DC | PRN
  Filled 2024-03-22: qty 60, 30d supply, fill #0

## 2024-03-22 MED ORDER — MELATONIN 5 MG PO TABS
5.0000 mg | ORAL_TABLET | Freq: Every day | ORAL | 0 refills | Status: DC
  Filled 2024-03-22: qty 30, 30d supply, fill #0

## 2024-03-22 MED ORDER — PANTOPRAZOLE SODIUM 40 MG PO TBEC: 40.0000 mg | DELAYED_RELEASE_TABLET | Freq: Every day | ORAL | 0 refills | Status: DC

## 2024-03-22 NOTE — Plan of Care (Signed)

## 2024-03-22 NOTE — Plan of Care (Signed)

## 2024-03-22 NOTE — Discharge Summary (Signed)
 Physician Discharge Summary   Patient: Misty Ferguson MRN: [id number] DOB: 06/09/1934  Admit date:     03/14/2024  Discharge date: 03/22/24  Discharge Physician: Murvin Mana   PCP: Avelina Greig BRAVO, MD   Recommendations at discharge:   Follow-up with PCP in 1 week. Follow-up with vascular surgery as scheduled.  Discharge Diagnoses: Active Problems:   Acute on chronic respiratory failure with hypoxemia (HCC)   CAP (community acquired pneumonia)   Critical limb ischemia of left lower extremity (HCC)   SBO (small bowel obstruction) (HCC)  Resolved Problems:   * No resolved hospital problems. *  Hospital Course: Misty Ferguson is a 89 y.o. female with medical history significant of Severe COPD  , Chronic bronchiectasis , chronic hypoxic respiratory failure home O2 with exertion and at bedtime ,  Pleural nodule, HTN, PAD with symptoms predominantly in the left leg , HLD, Prediabetes, bladder CA s/p TURBT 05/22/2019 s/p chemo and xrt,tobacco abuse who presents to ED BIB EMS S/p call out for a fall. However on arrival EMS noted patient was in respiratory distress.  She was found sitting on toilet with multiple skin tears noted.  Her O2 sat was 85%  Patient was diagnosed with AECOPD/CAP, s/p Bipap, transition to O2 via [place] further management as below.   LLE, no dopplerable pulse.  Seen by Dr. Marea vascular surgery and started heparin  IV infusion.s/p angioplasty and stent inserted in left SFA, and left profunda femoris artery done on 8/4 8/5  Assessment and Plan: # Acute on chronic critical limb ischemia left lower extremity Continue ASA 81 mg and Plavix  75 mg p.o. daily S/p Heparin  IV infusion, on 8/4 started Aggrastat  infusion after angiogram as per vascular surgery till 8/5 Vascular surgery consulted s/p angioplasty and stent inserted in left SFA, and left profunda femoris artery done on 8/4 8/5 continue DAPT, off anticoagulation. No acute issues.   # Small bowel obstruction.  Resolved  with conservative management 8/3 respiratory status got worse and patient was not feeling right, also having constipation, not passing gas.  Feeling abdominal pressure and abdominal tenderness. KUB positive for small bowel obstruction CT a/p: Small-bowel obstruction, with dilated proximal small bowel measuring up to 3.6 cm. The exact point of obstruction is not identified, but the distal jejunum and ileum are decompressed. Moderate retained stool throughout the colon. General surgery consulted. 8/4 SBFT: Contrast material throughout the colon with decompressed small bowel. No current evidence for bowel obstruction. 8/4 SBO resolved since.  Patient still has some constipation, complaining of abdominal pain, currently taking MiraLAX , added senna.  Continue Protonix , also added sucralfate .     # Acute on chronic hypoxic respiratory failure due to pneumonia and COPD exacerbation On 2 L oxygen  at baseline S/p BiPAP in the ED, use BiPAP as needed Continue supplemental O2 admission and gradually wean down to baseline Continue breathing treatment Status post IV Solu-Medrol , continue prednisone  oral 8/3 CT chest: Right lower lobe bronchial wall thickening with areas of mucoid impaction, which could be sequela of aspiration or infection. Moderate partially loculated left pleural effusion, with compressive atelectasis in the dependent left lower lobe, not appreciably changed since prior study. 2. New trace free-flowing right pleural effusion with minimal right lower lobe atelectasis.\ Condition improved, antibiotic completed.   # Pleural effusion, left 8/6 s/p left thoracentesis 1.1 L fluid was tapped by IR Fluid studies consistent with partially exudative by LDH criteria.  No evidence of infection. CXR postprocedure did not show any complications    #  Community-acquired pneumonia Completed cefepime  and azithromycin  x 5 days    # Acute blood loss anemia She was thought over 2 units of blood  transfusion, hemoglobin has been stable since.   Insomnia. Patient could not sleep for the last 2 or 3 days, feel pretty bad.  Will continue melatonin.  Discontinue trazodone , added Seroquel . Patient slept well last night.  Will continue current regimen at discharge.   Nonalcoholic steatohepatitis. Patient had elevated liver enzymes, which is gradually improving.  Due to concern of metastatic cancer, ultrasound was obtained, showed steatohepatitis without neoplasm.   Severe protein calorie malnutrition. Patient appears severely malnourished, encouraged protein intake           Consultants: Vascular Procedures performed: LE angioplasty  Disposition: Home health Diet recommendation:  Discharge Diet Orders (From admission, onward)     Start     Ordered   03/22/24 0000  Diet - low sodium heart healthy        03/22/24 1203           Cardiac diet DISCHARGE MEDICATION: Allergies as of 03/22/2024   No Known Allergies      Medication List     STOP taking these medications    docusate sodium  100 MG capsule Commonly known as: COLACE   losartan  25 MG tablet Commonly known as: COZAAR    Ohtuvayre  3 MG/2.5ML Susp Generic drug: Ensifentrine        TAKE these medications    albuterol  108 (90 Base) MCG/ACT inhaler Commonly known as: VENTOLIN  HFA Inhale 2 puffs into the lungs every 6 (six) hours as needed for wheezing or shortness of breath.   albuterol  (2.5 MG/3ML) 0.083% nebulizer solution Commonly known as: PROVENTIL  INHALE THE CONTENTS OF 1 VIAL VIA NEBULIZER EVERY 4 HOURS AS NEEDED FOR WHEEZING OR SHORTNESS OF BREATH.   ascorbic acid 1000 MG tablet Commonly known as: VITAMIN C Take 1,000 mg by mouth daily.   aspirin  EC 81 MG tablet Take 1 tablet (81 mg total) by mouth daily. Swallow whole.   atorvastatin  10 MG tablet Commonly known as: LIPITOR TAKE 1 TABLET EVERY DAY   Azelastine  HCl 137 MCG/SPRAY Soln Place 2 sprays into both nostrils 2 (two) times  daily.   Breztri  Aerosphere 160-9-4.8 MCG/ACT Aero inhaler Generic drug: budesonide -glycopyrrolate -formoterol  Inhale 2 puffs into the lungs in the morning and at bedtime.   Breztri  Aerosphere 160-9-4.8 MCG/ACT Aero inhaler Generic drug: budesonide -glycopyrrolate -formoterol  Inhale 2 puffs into the lungs 2 (two) times daily.   clopidogrel  75 MG tablet Commonly known as: PLAVIX  TAKE 1 TABLET EVERY DAY   CoQ10 200 MG Caps Take by mouth.   guaiFENesin  600 MG 12 hr tablet Commonly known as: MUCINEX  Take 600 mg by mouth 2 (two) times daily.   melatonin 5 MG Tabs Take 1 tablet (5 mg total) by mouth at bedtime.   NAC 600 PO Take by mouth.   OXYGEN  Inhale 4 L into the lungs daily in the afternoon.   pantoprazole  40 MG tablet Commonly known as: PROTONIX  Take 1 tablet (40 mg total) by mouth daily.   Pari LC Sprint Nebulizer Set Misc   polyethylene glycol 17 g packet Commonly known as: MIRALAX  / GLYCOLAX  Take 17 g by mouth daily. Start taking on: March 23, 2024   PreserVision AREDS Tabs Take 1 tablet by mouth 2 (two) times daily.   QUEtiapine  25 MG tablet Commonly known as: SEROQUEL  Take 1 tablet (25 mg total) by mouth at bedtime.   senna-docusate 8.6-50 MG tablet Commonly known as:  Senokot-S Take 1 tablet by mouth 2 (two) times daily as needed for mild constipation.   verapamil  360 MG 24 hr capsule Commonly known as: VERELAN  Take 360 mg by mouth daily.   Vitamin D  125 MCG (5000 UT) Caps Take 5,000 Units by mouth daily.   Vitamin E 180 MG Caps Take 180 mg by mouth daily.               Discharge Care Instructions  (From admission, onward)           Start     Ordered   03/22/24 0000  Discharge wound care:       Comments: Follow with PCP in 1 week   03/22/24 1203            Follow-up Information     [name], Amy E, MD Follow up in 1 week(s).   Specialty: Family Medicine Contact information: [address] [place]  [address] [phone]                Discharge Exam: Fredricka Weights   03/14/24 9288 03/14/24 2142 03/16/24 0924  Weight: 40.4 kg 42.2 kg 42.2 kg   General exam: Appears calm and comfortable, frail, appears severely malnourished. Respiratory system: Clear to auscultation. Respiratory effort normal. Cardiovascular system: S1 & S2 heard, RRR. No JVD, murmurs, rubs, gallops or clicks. No pedal edema. Gastrointestinal system: Abdomen is nondistended, soft and nontender. No organomegaly or masses felt. Normal bowel sounds heard. Central nervous system: Alert and oriented. No focal neurological deficits. Extremities: Symmetric 5 x 5 power. Skin: No rashes, lesions or ulcers Psychiatry: Mood & affect appropriate.    Condition at discharge: good  The results of significant diagnostics from this hospitalization (including imaging, microbiology, ancillary and laboratory) are listed below for reference.   Imaging Studies: US  Abdomen Limited RUQ (LIVER/GB) Result Date: 03/21/2024 CLINICAL DATA:  Cirrhosis. EXAM: ULTRASOUND ABDOMEN LIMITED RIGHT UPPER QUADRANT COMPARISON:  CT abdomen 03/15/2024. FINDINGS: Gallbladder: No gallstones or wall thickening visualized. No sonographic Murphy sign noted by sonographer. Common bile duct: Diameter: 3 mm, within normal limits. No intrahepatic biliary ductal dilatation. Liver: Diffusely increased in echogenicity. No definite focal lesion. Portal vein is patent on color Doppler imaging with normal direction of blood flow towards the liver. Other: None. IMPRESSION: Hepatic steatosis. Electronically Signed   By: Newell Eke M.D.   On: 03/21/2024 13:35   DG Chest Port 1 View Result Date: 03/18/2024 CLINICAL DATA:  Status post left thoracentesis. EXAM: PORTABLE CHEST 1 VIEW COMPARISON:  March 16, 2024. FINDINGS: The heart size and mediastinal contours are within normal limits. No acute pulmonary abnormality is noted. Stable probable scarring seen in right upper  lobe. The visualized skeletal structures are unremarkable. IMPRESSION: No active disease. Electronically Signed   By: Lynwood Landy Raddle M.D.   On: 03/18/2024 16:19   US  THORACENTESIS ASP PLEURAL SPACE W/IMG GUIDE Result Date: 03/18/2024 INDICATION: 89 year old female with acute on chronic hypoxic respiratory failure due to pneumonia and COPD exacerbation, on 2 L O2 at baseline, with new onset left pleural effusion. IR was requested for diagnostic and therapeutic thoracentesis. EXAM: ULTRASOUND GUIDED DIAGNOSTIC AND THERAPEUTIC THORACENTESIS MEDICATIONS: 5 cc of 1% lidocaine  COMPLICATIONS: None immediate. PROCEDURE: An ultrasound guided thoracentesis was thoroughly discussed with the patient and questions answered. The benefits, risks, alternatives and complications were also discussed. The patient understands and wishes to proceed with the procedure. Written consent was obtained. Ultrasound was performed to localize and mark an adequate pocket  of fluid in the left chest. The area was then prepped and draped in the normal sterile fashion. 1% Lidocaine  was used for local anesthesia. Under ultrasound guidance a 6 Fr Safe-T-Centesis catheter was introduced. Thoracentesis was performed. The catheter was removed and a dressing applied. FINDINGS: A total of approximately 1.1 L of clear, straw-colored pleural fluid was removed. Samples were sent to the laboratory as requested by the clinical team. IMPRESSION: Successful ultrasound guided left thoracentesis yielding 1.1 L of pleural fluid. Procedure performed by Carlin Griffon, PA-C Electronically Signed   By: CHRISTELLA.  Shick M.D.   On: 03/18/2024 15:40   DG Chest Port 1 View Result Date: 03/16/2024 EXAM: 1 VIEW XRAY OF THE CHEST 03/16/2024 09:32:18 PM COMPARISON: 03/15/2024 CLINICAL HISTORY: Shortness of breath (SOB) FINDINGS: LUNGS AND PLEURA: Left basilar airspace opacities and small left pleural effusion. Heterogeneous opacities in the right mid lung. No pneumothorax.  Hyperinflation and chronic bronchitic changes. HEART AND MEDIASTINUM: Stable cardiomediastinal silhouette. Aortic atherosclerotic calcification. BONES AND SOFT TISSUES: No acute osseous abnormality. IMPRESSION: 1. Stable left basilar airspace opacities and small left pleural effusion. 2. Emphysema. Electronically signed by: Norman Gatlin MD 03/16/2024 10:12 PM EDT RP Workstation: [id number]   DG Abd Portable 1V-Small Bowel Obstruction Protocol-initial, 8 hr delay Result Date: 03/16/2024 CLINICAL DATA:  10 hour delay or small-bowel obstruction. EXAM: PORTABLE ABDOMEN - 1 VIEW COMPARISON:  CT and plain films earlier today. FINDINGS: Contrast material noted throughout to the colon. Small bowel is decompressed. Calcifications throughout the pancreas. No free air or organomegaly. NG tube in the stomach. IMPRESSION: Contrast material throughout the colon with decompressed small bowel. No current evidence for bowel obstruction. Chronic pancreatitis changes. Electronically Signed   By: Franky Crease M.D.   On: 03/16/2024 12:55   PERIPHERAL VASCULAR CATHETERIZATION Result Date: 03/16/2024 See surgical note for result.  DG Abd 1 View Result Date: 03/15/2024 EXAM: 1 VIEW XRAY OF THE ABDOMEN 03/15/2024 09:33:04 PM COMPARISON: None available. CLINICAL HISTORY: Encounter for nasogastric (NG) tube placement; encounter for imaging study to confirm nasogastric tube placement. NG tube placement verification. FINDINGS: Enteric tube tip inside stomach. Remainder unchanged from same day CT given differences in technique. IMPRESSION: 1. Enteric tube tip is appropriately positioned within the stomach. Electronically signed by: Norman Gatlin MD 03/15/2024 09:41 PM EDT RP Workstation: [id number]   CT CHEST ABDOMEN PELVIS W CONTRAST Result Date: 03/15/2024 CLINICAL DATA:  Shortness of breath, abnormal chest x-ray, small-bowel obstruction EXAM: CT CHEST, ABDOMEN, AND PELVIS WITH CONTRAST TECHNIQUE: Multidetector CT imaging of the  chest, abdomen and pelvis was performed following the standard protocol during bolus administration of intravenous contrast. RADIATION DOSE REDUCTION: This exam was performed according to the departmental dose-optimization program which includes automated exposure control, adjustment of the mA and/or kV according to patient size and/or use of iterative reconstruction technique. CONTRAST:  80mL OMNIPAQUE  IOHEXOL  300 MG/ML  SOLN COMPARISON:  09/04/2023, 03/15/2024 FINDINGS: CT CHEST FINDINGS Cardiovascular: The heart is unremarkable without pericardial effusion. No evidence of thoracic aortic aneurysm or dissection. Atherosclerosis of the aorta and coronary vasculature. Mediastinum/Nodes: No enlarged mediastinal, hilar, or axillary lymph nodes. Thyroid  gland, trachea, and esophagus demonstrate no significant findings. Lungs/Pleura: Stable partially loculated left pleural effusion with compressive atelectasis within the left lower lobe. Trace free-flowing right pleural effusion with minimal dependent right lower lobe atelectasis. Upper lobe predominant emphysema. No pneumothorax. Right lower lobe bronchial wall thickening with areas of mucoid impaction. Musculoskeletal: No acute or destructive bony abnormalities. Reconstructed images demonstrate no additional findings. CT ABDOMEN  PELVIS FINDINGS Hepatobiliary: No focal liver abnormality is seen. No gallstones, gallbladder wall thickening, or biliary dilatation. Pancreas: Stable diffuse pancreatic parenchymal atrophy with coarse calcifications compatible with sequela of chronic calcific pancreatitis. Stable cystic dilatation of pancreatic duct. No acute inflammatory changes. Spleen: Normal in size without focal abnormality. Adrenals/Urinary Tract: Adrenal glands are unremarkable. Kidneys are normal, without renal calculi, focal lesion, or hydronephrosis. Bladder is unremarkable. Stomach/Bowel: Dilated proximal small bowel measuring up to 3.6 cm, with numerous gas fluid  levels, consistent with small-bowel obstruction. Exact transition point is not identified, but normal caliber distal jejunum and ileum are identified. Moderate gas and stool within the colon. Vascular/Lymphatic: Aortic atherosclerosis. No enlarged abdominal or pelvic lymph nodes. Reproductive: Uterus and bilateral adnexa are unremarkable. Other: Trace pelvic free fluid. No free intraperitoneal gas. No abdominal wall hernia. Musculoskeletal: No acute or destructive bony abnormalities. Reconstructed images demonstrate no additional findings. IMPRESSION: 1. Moderate partially loculated left pleural effusion, with compressive atelectasis in the dependent left lower lobe, not appreciably changed since prior study. 2. New trace free-flowing right pleural effusion with minimal right lower lobe atelectasis. 3. Small-bowel obstruction, with dilated proximal small bowel measuring up to 3.6 cm. The exact point of obstruction is not identified, but the distal jejunum and ileum are decompressed. 4. Moderate retained stool throughout the colon. 5. Stable sequela of chronic calcific pancreatitis and pancreatic parenchymal atrophy. No evidence of acute pancreatitis. 6. Right lower lobe bronchial wall thickening with areas of mucoid impaction, which could be sequela of aspiration or infection. 7. Aortic Atherosclerosis (ICD10-I70.0) and Emphysema (ICD10-J43.9). Electronically Signed   By: Ozell Daring M.D.   On: 03/15/2024 19:04   DG Abd 1 View Result Date: 03/15/2024 CLINICAL DATA:  [id number] Small bowel obstruction (HCC) [id number]. EXAM: ABDOMEN - 1 VIEW COMPARISON:  CT scan abdomen and pelvis from 09/04/2023. FINDINGS: There is gaseous dilatation of several small bowel loops overlying the central abdomen, which is disproportionate to the degree of distention of the colon, suggesting small bowel obstruction. If not already performed, CT of the abdomen or small bowel follow through is recommended for further evaluation. No evidence  of pneumoperitoneum, within the limitations of a supine film. No acute osseous abnormalities. The soft tissues are within normal limits. Surgical changes, devices, tubes and lines: None. IMPRESSION: *Findings favor small bowel obstruction. If not already performed, CT of the abdomen or small bowel follow through is recommended for further evaluation. Electronically Signed   By: Ree Molt M.D.   On: 03/15/2024 17:00   DG Chest Port 1 View Result Date: 03/15/2024 CLINICAL DATA:  New onset shortness of breath. EXAM: PORTABLE CHEST 1 VIEW COMPARISON:  03/14/2024. FINDINGS: Bilateral lungs appear hyperlucent with coarse bronchovascular markings, in keeping with COPD. Re-demonstration of left retrocardiac airspace opacity obscuring the left hemidiaphragm, descending thoracic aorta and blunting the left lateral costophrenic angle, suggesting combination of left lung atelectasis and/or consolidation with pleural effusion. No significant interval change. Mild heterogeneous opacities again seen overlying the right mid lung zone, laterally, which may represent underlying pneumonitis versus atelectasis. No interval change. Otherwise, no acute consolidation or lung collapse. Right lateral costophrenic angle is clear. Stable cardio-mediastinal silhouette. No acute osseous abnormalities. The soft tissues are within normal limits. IMPRESSION: *No significant interval change since the prior study. *Persistent left retrocardiac and right mid lung zone opacities, as described above. Electronically Signed   By: Ree Molt M.D.   On: 03/15/2024 16:31   CT Maxillofacial Wo Contrast Result Date: 03/14/2024 CLINICAL DATA:  89 year old female status post fall, respiratory distress. Skin tears. EXAM: CT MAXILLOFACIAL WITHOUT CONTRAST TECHNIQUE: Multidetector CT imaging of the maxillofacial structures was performed. Multiplanar CT image reconstructions were also generated. RADIATION DOSE REDUCTION: This exam was performed  according to the departmental dose-optimization program which includes automated exposure control, adjustment of the mA and/or kV according to patient size and/or use of iterative reconstruction technique. COMPARISON:  CT head and cervical spine today.  Face CT 10/22/2017. FINDINGS: Osseous: Mandible intact and normally located. Absent maxillary dentition. Bilateral maxilla, zygoma, pterygoid, nasal bones appear stable and intact. Central skull base appears intact. Orbits: Intact orbital walls. Chronic postoperative changes to the globes. Orbits soft tissues otherwise normal. Sinuses: Advanced chronic left sphenoid sinusitis with stable mucoperiosteal thickening. Other paranasal sinuses, tympanic cavities are clear. Chronic bilateral mastoid effusions and sclerosis, stable. Soft tissues: Mild motion artifact in the deep soft tissue spaces of the face. Negative visible noncontrast thyroid , larynx, pharynx, parapharyngeal spaces, retropharyngeal space, sublingual space, submandibular, masticator and parotid spaces. Calcified cervical carotid atherosclerosis. Limited intracranial: Stable to that reported separately. IMPRESSION: 1. No acute traumatic injury identified in the Face. 2. Stable chronic left sphenoid sinusitis, chronic bilateral mastoid inflammation. Electronically Signed   By: VEAR Hurst M.D.   On: 03/14/2024 09:18   CT Cervical Spine Wo Contrast Result Date: 03/14/2024 CLINICAL DATA:  89 year old female status post fall, respiratory distress. Skin tears. EXAM: CT CERVICAL SPINE WITHOUT CONTRAST TECHNIQUE: Multidetector CT imaging of the cervical spine was performed without intravenous contrast. Multiplanar CT image reconstructions were also generated. RADIATION DOSE REDUCTION: This exam was performed according to the departmental dose-optimization program which includes automated exposure control, adjustment of the mA and/or kV according to patient size and/or use of iterative reconstruction technique.  COMPARISON:  Head and face CT today. FINDINGS: Alignment: Straightening of cervical lordosis. Cervicothoracic junction alignment is within normal limits. Bilateral posterior element alignment is within normal limits. Skull base and vertebrae: Bone mineralization is within normal limits for age. Visualized skull base is intact. No atlanto-occipital dissociation. C1 and C2 appear intact and aligned. No acute osseous abnormality identified. Soft tissues and spinal canal: No prevertebral fluid or swelling. No visible canal hematoma. Motion artifact in the noncontrast neck. Calcified carotid atherosclerosis. Disc levels: Degenerative cervical vertebral ankylosis C3-C4. Advanced facet arthropathy, chronic disc and endplate degeneration at the adjacent segments. But no significant spinal stenosis by CT. Upper chest: Moderate to large layering left pleural effusion in the left lung apex, simple fluid density favoring transudate. Underlying emphysema, moderate to severe. Negative visible noncontrast thoracic inlet soft tissues. IMPRESSION: 1. No acute traumatic injury identified in the cervical spine. 2. Cervical spine degeneration superimposed on degenerative C3-C4 ankylosis, but no significant spinal stenosis by CT. 3. Emphysema (ICD10-J43.9). Moderate or possibly large layering Left Pleural Effusion suspected to be transudate. Electronically Signed   By: VEAR Hurst M.D.   On: 03/14/2024 09:16   CT Head Wo Contrast Result Date: 03/14/2024 CLINICAL DATA:  89 year old female status post fall, respiratory distress. Skin tears. EXAM: CT HEAD WITHOUT CONTRAST TECHNIQUE: Contiguous axial images were obtained from the base of the skull through the vertex without intravenous contrast. RADIATION DOSE REDUCTION: This exam was performed according to the departmental dose-optimization program which includes automated exposure control, adjustment of the mA and/or kV according to patient size and/or use of iterative reconstruction  technique. COMPARISON:  Face and cervical spine CT today reported separately. Head and face CT 10/22/2017. FINDINGS: Brain: Cerebral volume not significantly changed since 2019.  No midline shift, ventriculomegaly, mass effect, evidence of mass lesion, intracranial hemorrhage or evidence of cortically based acute infarction. Gray-white differentiation remains normal for age. Vascular: No suspicious intracranial vascular hyperdensity. Calcified atherosclerosis at the skull base. Stable punctate basal ganglia vascular calcifications. Skull: Stable. No acute osseous abnormality identified. Face reported separately. Sinuses/Orbits: Chronic left sphenoid sinusitis with mucoperiosteal thickening appears stable. Other paranasal sinuses well aerated. Mild chronic mastoid effusions and sclerosis stable since 2019. Tympanic cavities well aerated. Other: No orbit or scalp soft tissue injury identified. IMPRESSION: 1. No  acute traumatic injury identified. 2. Stable and negative for age noncontrast CT appearance of the brain. 3. Face CT reported separately. Electronically Signed   By: VEAR Hurst M.D.   On: 03/14/2024 09:13   DG Elbow 2 Views Right Result Date: 03/14/2024 EXAM: 1 or 2 VIEW(S) XRAY OF THE RIGHT ELBOW COMPARISON: 02/01/2023 CLINICAL HISTORY: Fall. EMS reports to have found patient sitting on the toilet with skin tears present to her right elbow and knee. Upon arrival, patient was in respiratory distress with bilateral expiratory wheezing and O2 sats at 85% on RA. History of COPD. FINDINGS: BONES AND JOINTS: No acute fracture. No focal osseous lesion. No joint dislocation. SOFT TISSUES: The soft tissues are unremarkable. IMPRESSION: 1. No acute abnormality. Electronically signed by: Waddell Calk MD 03/14/2024 08:47 AM EDT RP Workstation: [id number]   DG Knee 2 Views Right Result Date: 03/14/2024 EXAM: 1 or 2 VIEW(S) XRAY OF THE RIGHT KNEE 03/14/2024 08:33:00 AM COMPARISON: 02/01/2023 CLINICAL HISTORY: Fall. EMS  reports to have found patient sitting on the toilet with skin tears present to her right elbow and knee. Upon arrival, patient was in respiratory distress with bilateral expiratory wheezing and O2 sats at 85% on RA. History of COPD. FINDINGS: BONES AND JOINTS: No acute fracture. No focal osseous lesion. No joint dislocation. No significant joint effusion. No significant degenerative changes. SOFT TISSUES: New prepatellar soft tissue thickening measures 1.2 cm in thickness. Soft tissue irregularity with mild subcutaneous superficial soft tissue gas overlying the lateral knee compatible with soft tissue injury. IMPRESSION: 1. No acute fracture or dislocation. 2. Soft tissue irregularity with mild subcutaneous superficial soft tissue gas overlying the lateral knee, compatible with soft tissue injury. 3. New prepatellar soft tissue thickening measuring 1.2 cm in thickness . Electronically signed by: Waddell Calk MD 03/14/2024 08:46 AM EDT RP Workstation: [id number]   DG Hip Unilat With Pelvis 2-3 Views Left Result Date: 03/14/2024 EXAM: 2 or 3 VIEW(S) XRAY OF THE LEFT HIP 03/14/2024 08:33:00 AM COMPARISON: None available. CLINICAL HISTORY: Fall. EMS reports to have found patient sitting on the toilet with skin tears present to her right elbow and knee. Upon arrival, patient was in respiratory distress with bilateral expiratory wheezing and O2 sats at 85% on RA. History of COPD. FINDINGS: BONES AND JOINTS: No acute fracture or focal osseous lesion. The hip joint is maintained. No significant degenerative changes. SOFT TISSUES: The soft tissues are unremarkable. VASCULATURE: Vascular calcifications identified. Right common iliac artery stent. IMPRESSION: 1. No acute fracture or dislocation of the left hip or visualized pelvis. Electronically signed by: Waddell Calk MD 03/14/2024 08:44 AM EDT RP Workstation: [id number]   DG Chest Port 1 View Result Date: 03/14/2024 EXAM: 1 VIEW XRAY OF THE CHEST 03/14/2024 07:12:17  AM COMPARISON: 07/25/2022 CLINICAL HISTORY: SOB, respiratory distress. EMS reports they were called for a fall. Upon arrival they report to have found patient sitting on the toilet with skin tears present to her right  elbow and knee. Upon arrival patient was in respiratory distress with bilateral expiratory wheezing and O2 sats at 85% on RA. History of COPD. FINDINGS: LUNGS AND PLEURA: New small to moderate size left pleural effusion with overlying atelectasis and retrocardiac opacification. New patchy opacities noted within the central right upper lobe. Emphysema with diffuse bronchial wall thickening. HEART AND MEDIASTINUM: No acute abnormality of the cardiac and mediastinal silhouettes. Aortic atherosclerotic calcification. BONES AND SOFT TISSUES: Remote left lower anterior rib fractures. IMPRESSION: 1. New small to moderate size left pleural effusion with overlying atelectasis and retrocardiac opacification. 2. New small patchy opacities within the central right upper lobe. 3. Emphysema with diffuse bronchial wall thickening. 4. Aortic atherosclerotic calcification. 5. Remote left lower anterior rib fractures. Electronically signed by: Waddell Calk MD 03/14/2024 07:31 AM EDT RP Workstation: [id number]    Microbiology: Results for orders placed or performed during the hospital encounter of 03/14/24  Blood culture (routine x 2)     Status: None   Collection Time: 03/14/24 10:10 AM   Specimen: BLOOD LEFT ARM  Result Value Ref Range Status   Specimen Description BLOOD LEFT ARM  Final   Special Requests   Final    BOTTLES DRAWN AEROBIC AND ANAEROBIC Blood Culture results may not be optimal due to an inadequate volume of blood received in culture bottles   Culture   Final    NO GROWTH 5 DAYS Performed at [facility], [address] [place], [place] [address]    Report Status 03/19/2024 FINAL  Final  Blood culture (routine x 2)     Status: None   Collection Time: 03/14/24 10:10 AM    Specimen: BLOOD RIGHT ARM  Result Value Ref Range Status   Specimen Description BLOOD RIGHT ARM  Final   Special Requests   Final    BOTTLES DRAWN AEROBIC AND ANAEROBIC Blood Culture results may not be optimal due to an inadequate volume of blood received in culture bottles   Culture   Final    NO GROWTH 5 DAYS Performed at [facility], [address] [place], [place] [address]    Report Status 03/19/2024 FINAL  Final  Respiratory (~20 pathogens) panel by PCR     Status: None   Collection Time: 03/14/24  3:39 PM   Specimen: Nasopharyngeal Swab; Respiratory  Result Value Ref Range Status   Adenovirus NOT DETECTED NOT DETECTED Final   Coronavirus 229E NOT DETECTED NOT DETECTED Final    Comment: (NOTE) The Coronavirus on the Respiratory Panel, DOES NOT test for the novel  Coronavirus (2019 nCoV)    Coronavirus HKU1 NOT DETECTED NOT DETECTED Final   Coronavirus NL63 NOT DETECTED NOT DETECTED Final   Coronavirus OC43 NOT DETECTED NOT DETECTED Final   Metapneumovirus NOT DETECTED NOT DETECTED Final   Rhinovirus / Enterovirus NOT DETECTED NOT DETECTED Final   Influenza A NOT DETECTED NOT DETECTED Final   Influenza B NOT DETECTED NOT DETECTED Final   Parainfluenza Virus 1 NOT DETECTED NOT DETECTED Final   Parainfluenza Virus 2 NOT DETECTED NOT DETECTED Final   Parainfluenza Virus 3 NOT DETECTED NOT DETECTED Final   Parainfluenza Virus 4 NOT DETECTED NOT DETECTED Final   Respiratory Syncytial Virus NOT DETECTED NOT DETECTED Final   Bordetella pertussis NOT DETECTED NOT DETECTED Final   Bordetella Parapertussis NOT DETECTED NOT DETECTED Final   Chlamydophila pneumoniae NOT DETECTED NOT DETECTED Final   Mycoplasma pneumoniae NOT DETECTED NOT DETECTED Final    Comment: Performed at [facility], [address] [place], [place] [address]  Expectorated Sputum Assessment w Gram Stain, Rflx to Resp Cult     Status: None   Collection Time: 03/14/24  3:40 PM   Specimen: Sputum   Result Value Ref Range Status   Specimen Description SPUTUM  Final   Special Requests NONE  Final   Sputum evaluation   Final    Sputum specimen not acceptable for testing.  Please recollect.   CALLED TO JES SAVAGE @1750  03/14/24 MJU Performed at [facility], [address] [place], [place] [address]    Report Status 03/14/2024 FINAL  Final  Resp panel by RT-PCR (RSV, Flu A&B, Covid) Anterior Nasal Swab     Status: None   Collection Time: 03/15/24  4:10 PM   Specimen: Anterior Nasal Swab  Result Value Ref Range Status   SARS Coronavirus 2 by RT PCR NEGATIVE NEGATIVE Final    Comment: (NOTE) SARS-CoV-2 target nucleic acids are NOT DETECTED.  The SARS-CoV-2 RNA is generally detectable in upper respiratory specimens during the acute phase of infection. The lowest concentration of SARS-CoV-2 viral copies this assay can detect is 138 copies/mL. A negative result does not preclude SARS-Cov-2 infection and should not be used as the sole basis for treatment or other patient management decisions. A negative result may occur with  improper specimen collection/handling, submission of specimen other than nasopharyngeal swab, presence of viral mutation(s) within the areas targeted by this assay, and inadequate number of viral copies(<138 copies/mL). A negative result must be combined with clinical observations, patient history, and epidemiological information. The expected result is Negative.  Fact Sheet for Patients:  BloggerCourse.com  Fact Sheet for Healthcare Providers:  SeriousBroker.it  This test is no t yet approved or cleared by the [place]  FDA and  has been authorized for detection and/or diagnosis of SARS-CoV-2 by FDA under an Emergency Use Authorization (EUA). This EUA will remain  in effect (meaning this test can be used) for the duration of the COVID-19 declaration under Section 564(b)(1) of the Act,  21 U.S.C.section 360bbb-3(b)(1), unless the authorization is terminated  or revoked sooner.       Influenza A by PCR NEGATIVE NEGATIVE Final   Influenza B by PCR NEGATIVE NEGATIVE Final    Comment: (NOTE) The Xpert Xpress SARS-CoV-2/FLU/RSV plus assay is intended as an aid in the diagnosis of influenza from Nasopharyngeal swab specimens and should not be used as a sole basis for treatment. Nasal washings and aspirates are unacceptable for Xpert Xpress SARS-CoV-2/FLU/RSV testing.  Fact Sheet for Patients: BloggerCourse.com  Fact Sheet for Healthcare Providers: SeriousBroker.it  This test is not yet approved or cleared by the [place]  FDA and has been authorized for detection and/or diagnosis of SARS-CoV-2 by FDA under an Emergency Use Authorization (EUA). This EUA will remain in effect (meaning this test can be used) for the duration of the COVID-19 declaration under Section 564(b)(1) of the Act, 21 U.S.C. section 360bbb-3(b)(1), unless the authorization is terminated or revoked.     Resp Syncytial Virus by PCR NEGATIVE NEGATIVE Final    Comment: (NOTE) Fact Sheet for Patients: BloggerCourse.com  Fact Sheet for Healthcare Providers: SeriousBroker.it  This test is not yet approved or cleared by the [place]  FDA and has been authorized for detection and/or diagnosis of SARS-CoV-2 by FDA under an Emergency Use Authorization (EUA). This EUA will remain in effect (meaning this test can be used) for the duration of the COVID-19 declaration under Section 564(b)(1)  of the Act, 21 U.S.C. section 360bbb-3(b)(1), unless the authorization is terminated or revoked.  Performed at [facility], [address] [place], [place] [address]   Body fluid culture w Gram Stain     Status: None   Collection Time: 03/18/24  3:28 PM   Specimen: PATH Cytology Pleural fluid   Result Value Ref Range Status   Specimen Description   Final    PLEURAL Performed at [facility], [address] [place], [place] [address]    Special Requests   Final    PLEURAL Performed at [facility], [address] [place], [place] [address]    Gram Stain NO WBC SEEN NO ORGANISMS SEEN   Final   Culture   Final    NO GROWTH 4 DAYS Performed at [facility], [address] [place], [place] [address]    Report Status 03/22/2024 FINAL  Final    Labs: CBC: Recent Labs  Lab 03/18/24 0708 03/19/24 0312 03/19/24 1655 03/20/24 0449 03/21/24 0203 03/22/24 0543  WBC 10.2 7.1  --  7.3 11.4* 7.1  HGB 8.0* 6.7* 10.6* 9.6* 10.0* 9.4*  HCT 24.6* 21.0* 31.2* 28.4* 30.4* 29.6*  MCV 89.5 91.3  --  91.3 92.4 96.1  PLT 203 200  --  212 248 229   Basic Metabolic Panel: Recent Labs  Lab 03/16/24 0613 03/17/24 0443 03/18/24 0529 03/18/24 1252 03/19/24 0312 03/20/24 0449 03/21/24 0203 03/22/24 0543  NA 142 137 138 139 137 138 137 138  K 4.4 4.7 5.6* 5.4* 5.4* 4.8 4.3 3.9  CL 106 104 105 104 103 98 99 99  CO2 24 26 27 28 27 31 30 31   GLUCOSE 121* 134* 121* 137* 158* 140* 124* 102*  BUN 24* 28* 33* 36* 37* 41* 39* 30*  CREATININE 0.57 0.85 0.95 1.07* 0.91 0.78 0.61 0.75  CALCIUM  8.7* 8.1* 8.5* 8.8* 8.5* 8.8* 8.9 8.3*  MG 2.0 2.0 2.3  --  2.0  --   --   --   PHOS 3.5 3.9 3.0  --  3.0  --   --   --    Liver Function Tests: Recent Labs  Lab 03/19/24 0312 03/20/24 0449 03/21/24 0203 03/22/24 0543  AST 107* 99* 73* 48*  ALT 226* 248* 202* 143*  ALKPHOS 91 113 101 87  BILITOT 0.7 0.6 0.7 0.9  PROT 4.6* 4.8* 4.9* 4.3*  ALBUMIN 2.5* 2.6* 2.7* 2.3*   CBG: No results for input(s): GLUCAP in the last 168 hours.  Discharge time spent: 35 minutes  Signed: Murvin Mana, MD [facility] 03/22/2024

## 2024-03-23 ENCOUNTER — Telehealth: Payer: Self-pay

## 2024-03-23 NOTE — Patient Instructions (Signed)
 Visit Information  Thank you for taking time to visit with me today. Please don't hesitate to contact me if I can be of assistance to you before our next scheduled telephone appointment.  Our next appointment is by telephone on 03/24/24 at 1200 pm  Following is a copy of your care plan:   Goals Addressed             This Visit's Progress    VBCI Transitions of Care (TOC) Care Plan       Problems:  Recent Hospitalization for treatment of COPD and pneumonia and small bowel obstruction Home Health services barrier: Home health start of care and Knowledge Deficit Related to Pneumonia/COPD and bowel obstruction  03/23/24 Patient better from hospitalization.  She states she home health has not set up start of care.  She has skin tear dressings to right hand, right elbow and right knee.  Patient also has some pain at times.  Advised to use tylenol  as needed and discuss stronger pain medication on follow up visit with PCP.  Discussed COPD and bowel management. She verbalized understanding.  RN CM will follow up on 03/24/24. Goal:  Over the next 30 days, the patient will not experience hospital readmission  Interventions:  Transitions of Care: Doctor Visits  - discussed the importance of doctor visits Communication with [facility] home health re: Start of care: Lynette representative to call back Reviewed Signs and symptoms of infection Oxygen  use- keeping oxygen  levels above 80. Encouraged patient to pace self with activity. Reviewed importance of medication compliance. Reviewed when to call for physician for increased shortness of breath, increased sputum, fatigue that is not normal, cough, fever or sick contacts.   COPD Interventions: Provided education about and advised patient to utilize infection prevention strategies to reduce risk of respiratory infection Provided instruction about proper use of medications used for management of COPD including inhalers Provided written and verbal  instructions on pursed lip breathing and utilized returned demonstration as teach back Use of home oxygen   Patient Self Care Activities:  Attend all scheduled provider appointments Call provider office for new concerns or questions  Notify RN Care Manager of TOC call rescheduling needs Participate in Transition of Care Program/Attend TOC scheduled calls Take medications as prescribed   do breathing exercises every day keep follow-up appointments: PCP 8/12 and Pulmonologist 8/26  do breathing exercises at least 2 times each day Uses smart vest as ordered BIDx  Plan:  The patient has been provided with contact information for the care management team and has been advised to call with any health related questions or concerns.         Patient verbalizes understanding of instructions and care plan provided today and agrees to view in MyChart. Active MyChart status and patient understanding of how to access instructions and care plan via MyChart confirmed with patient.     The patient has been provided with contact information for the care management team and has been advised to call with any health related questions or concerns.   Please call the care guide team at [phone] if you need to cancel or reschedule your appointment.   Please call the Suicide and Crisis Lifeline: 988 if you are experiencing a Mental Health or Behavioral Health Crisis or need someone to talk to.  Rogene Meth J. Doniqua Saxby RN, MSN [facility], [facility] RN Care Manager Direct Dial: [phone]  Fax: [phone] Website: [email or web address]

## 2024-03-23 NOTE — Transitions of Care (Post Inpatient/ED Visit) (Signed)
 03/23/2024  Name: Misty Ferguson MRN: [id number] DOB: 01-19-1934  Today's TOC FU Call Status: Today's TOC FU Call Status:: Successful TOC FU Call Completed TOC FU Call Complete Date: 03/23/24 Patient's Name and Date of Birth confirmed.  Transition Care Management Follow-up Telephone Call Date of Discharge: 03/22/24 Discharge Facility: [facility]) Type of Discharge: Inpatient Admission Primary Inpatient Discharge Diagnosis:: Acute on chronic respiratory failure with hypoxemia How have you been since you were released from the hospital?: Better Any questions or concerns?: No  Items Reviewed: Did you receive and understand the discharge instructions provided?: Yes Medications obtained,verified, and reconciled?: Yes (Medications Reviewed) Any new allergies since your discharge?: No Dietary orders reviewed?: Yes Type of Diet Ordered:: Low sodium heart healthy Do you have support at home?: Yes People in Home [RPT]: spouse Name of Support/Comfort Primary Source: Camellia  Medications Reviewed Today: Medications Reviewed Today     Reviewed by Jordane Hisle, RN (Case Manager) on 03/23/24 at 1436  Med List Status: <None>   Medication Order Taking? Sig Documenting Provider Last Dose Status Informant  Acetylcysteine (NAC 600 PO) [id number]  Take by mouth. [provider]  Active Self, Spouse/Significant Other, Pharmacy Records  albuterol  (PROVENTIL ) (2.5 MG/3ML) 0.083% nebulizer solution [id number] Yes INHALE THE CONTENTS OF 1 VIAL VIA NEBULIZER EVERY 4 HOURS AS NEEDED FOR WHEEZING OR SHORTNESS OF BREATH. Kasa, Kurian, MD  Active Self, Spouse/Significant Other, Pharmacy Records  albuterol  (VENTOLIN  HFA) [phone] Base) MCG/ACT inhaler [id number] Yes Inhale 2 puffs into the lungs every 6 (six) hours as needed for wheezing or shortness of breath. Kasa, Kurian, MD  Active Self, Spouse/Significant Other, Pharmacy Records  ascorbic acid (VITAMIN C) 1000 MG tablet  [id number] Yes Take 1,000 mg by mouth daily.  [provider]  Active Self, Spouse/Significant Other, Pharmacy Records  aspirin  EC 81 MG tablet [id number] Yes Take 1 tablet (81 mg total) by mouth daily. Swallow whole. Marea Selinda RAMAN, MD  Active Self, Spouse/Significant Other, Pharmacy Records  atorvastatin  (LIPITOR) 10 MG tablet [id number] Yes TAKE 1 TABLET EVERY DAY Dew, Jason S, MD  Active Self, Spouse/Significant Other, Pharmacy Records  Azelastine  HCl 137 MCG/SPRAY SOLN [id number] Yes Place 2 sprays into both nostrils 2 (two) times daily. Kasa, Kurian, MD  Active Self, Spouse/Significant Other, Pharmacy Records  budesonide -glycopyrrolate -formoterol  (BREZTRI  AEROSPHERE) 160-9-4.8 MCG/ACT AERO inhaler [id number] Yes Inhale 2 puffs into the lungs in the morning and at bedtime. Kasa, Kurian, MD  Active Self, Spouse/Significant Other, Pharmacy Records  budesonide -glycopyrrolate -formoterol  (BREZTRI  AEROSPHERE) 160-9-4.8 MCG/ACT AERO inhaler [id number]  Inhale 2 puffs into the lungs 2 (two) times daily. Avelina Greig BRAVO, MD  Active Self, Spouse/Significant Other, Pharmacy Records  Cholecalciferol  (VITAMIN D ) 125 MCG (5000 UT) CAPS [id number] Yes Take 5,000 Units by mouth daily. [provider]  Active Self, Spouse/Significant Other, Pharmacy Records  clopidogrel  (PLAVIX ) 75 MG tablet [id number] Yes TAKE 1 TABLET EVERY DAY Dew, Jason S, MD  Active Self, Spouse/Significant Other, Pharmacy Records  Coenzyme Q10 (COQ10) 200 MG CAPS [id number] Yes Take by mouth. [provider]  Active Self, Spouse/Significant Other, Pharmacy Records  guaiFENesin  (MUCINEX ) 600 MG 12 hr tablet [id number] Yes Take 600 mg by mouth 2 (two) times daily. [provider]  Active Spouse/Significant Other, Self, Pharmacy Records           Med Note RAYNE ARLEY CHRISTELLA   Sat Mar 14, 2024  3:49 PM) PRN  melatonin 5 MG TABS [id number] Yes Take 1 tablet (5 mg total) by mouth  at bedtime. Laurita Pillion, MD  Active   Multiple  Vitamins-Minerals (PRESERVISION AREDS) TABS [id number] Yes Take 1 tablet by mouth 2 (two) times daily. [provider]  Active Self, Spouse/Significant Other, Pharmacy Records  OXYGEN  [id number] Yes Inhale 4 L into the lungs daily in the afternoon. [provider]  Active Self, Spouse/Significant Other, Pharmacy Records  pantoprazole  (PROTONIX ) 40 MG tablet [id number] Yes Take 1 tablet (40 mg total) by mouth daily. Laurita Pillion, MD  Active   [facility] Sprint Nebulizer Set MISC [id number] Yes  [provider]  Active Self, Spouse/Significant Other, Pharmacy Records  polyethylene glycol (MIRALAX  / GLYCOLAX ) 17 g packet [id number] Yes Take 17 g by mouth daily. Laurita Pillion, MD  Active   QUEtiapine  (SEROQUEL ) 25 MG tablet [id number] Yes Take 1 tablet (25 mg total) by mouth at bedtime. Laurita Pillion, MD  Active   senna-docusate (SENOKOT-S) 8.6-50 MG tablet [id number] Yes Take 1 tablet by mouth 2 (two) times daily as needed for mild constipation. Laurita Pillion, MD  Active   verapamil  (VERELAN  PM) 360 MG 24 hr capsule [id number] Yes Take 360 mg by mouth daily. [provider]  Active Self, Spouse/Significant Other, Pharmacy Records  Vitamin E 180 MG CAPS [id number] Yes Take 180 mg by mouth daily. [provider]  Active Self, Spouse/Significant Other, Pharmacy Records            Home Care and Equipment/Supplies: Were Home Health Services Ordered?: Yes Name of Home Health Agency:: [facility] (Agency made contact 8/10 but has not called to schedule) Has Agency set up a time to come to your home?: No Any new equipment or medical supplies ordered?: NA  Functional Questionnaire: Do you need assistance with bathing/showering or dressing?: Yes (spouse assists) Do you need assistance with meal preparation?: Yes (spouse assists) Do you have difficulty maintaining continence: No Do you need assistance with getting out of bed/getting out of a chair/moving?: Yes (spouse  assists) Do you have difficulty managing or taking your medications?: Yes (spouse assists)  Follow up appointments reviewed: PCP Follow-up appointment confirmed?: Yes Date of PCP follow-up appointment?: 03/27/24 Follow-up Provider: [facility] Follow-up appointment confirmed?: No Reason Specialist Follow-Up Not Confirmed:  (patient to call Pulmonologist.  Patient waiting on vascular call) Do you understand care options if your condition(s) worsen?: Yes-patient verbalized understanding (Encouraged patient to pace self with activity.Reviewed importance of medication compliance. Reviewed when to call for physician for increased shortness of breath,increased sputu,fatigue that is not normal,cough,fever or sick contacts.)  SDOH Interventions Today    Flowsheet Row Most Recent Value  SDOH Interventions   Food Insecurity Interventions Intervention Not Indicated  Housing Interventions Intervention Not Indicated  Transportation Interventions Intervention Not Indicated  Utilities Interventions Intervention Not Indicated    Vernal Hritz J. Joshual Terrio RN, MSN [facility]  [facility], [facility] RN Care Manager Direct Dial: [phone]  Fax: [phone] Website: [email or web address]

## 2024-03-23 NOTE — Telephone Encounter (Signed)
 This encounter was created in error - please disregard.

## 2024-03-24 ENCOUNTER — Other Ambulatory Visit: Payer: Self-pay

## 2024-03-24 LAB — ABO/RH: ABO/RH(D): AB POS

## 2024-03-24 NOTE — Patient Instructions (Signed)
 Visit Information  Thank you for taking time to visit with me today. Please don't hesitate to contact me if I can be of assistance to you before our next scheduled telephone appointment.  Our next appointment is by telephone on 03/31/24 at 1200 pm  Following is a copy of your care plan:   Goals Addressed             This Visit's Progress    VBCI Transitions of Care (TOC) Care Plan       Problems:  Recent Hospitalization for treatment of COPD and pneumonia and small bowel obstruction Knowledge Deficit Related to Pneumonia/COPD and bowel obstruction Home Health Start of care 03/25/24  03/24/24 Patient okay today.  Breathing is good.  Discussed home health start of care date as 03/25/24.  Patient and spouse in agreement.  Patient has some pain at times and not sleeping at night.  Message to physician requesting pain and something for sleep.   Advised to use tylenol  as needed.    Reviewed COPD and bowel management. She verbalized understanding.  RN CM will follow up on 03/31/24. Goal:  Over the next 30 days, the patient will not experience hospital readmission  Interventions:  Transitions of Care: Doctor Visits  - discussed the importance of doctor visits Communication with [facility] home health re: Start of care: Larraine Blush representative confirmed start of care date 03/25/24 Reviewed Signs and symptoms of infection Oxygen  use- keeping oxygen  levels above 80. Encouraged patient to pace self with activity. Reviewed importance of medication compliance. Reviewed when to call for physician for increased shortness of breath, increased sputum, fatigue that is not normal, cough, fever or sick contacts.   COPD Interventions: Provided education about and advised patient to utilize infection prevention strategies to reduce risk of respiratory infection Provided instruction about proper use of medications used for management of COPD including inhalers Provided written and verbal instructions on  pursed lip breathing and utilized returned demonstration as teach back Use of home oxygen   Patient Self Care Activities:  Attend all scheduled provider appointments Call provider office for new concerns or questions  Notify RN Care Manager of TOC call rescheduling needs Participate in Transition of Care Program/Attend TOC scheduled calls Take medications as prescribed   do breathing exercises every day keep follow-up appointments: PCP 8/15 and Pulmonologist 8/26  do breathing exercises at least 2 times each day Uses smart vest as ordered BIDx  Plan:  The patient has been provided with contact information for the care management team and has been advised to call with any health related questions or concerns.         Patient verbalizes understanding of instructions and care plan provided today and agrees to view in MyChart. Active MyChart status and patient understanding of how to access instructions and care plan via MyChart confirmed with patient.     The patient has been provided with contact information for the care management team and has been advised to call with any health related questions or concerns.   Please call the care guide team at [phone] if you need to cancel or reschedule your appointment.   Please call the Suicide and Crisis Lifeline: 988 if you are experiencing a Mental Health or Behavioral Health Crisis or need someone to talk to.  Kraig Genis J. Amedio Bowlby RN, MSN [facility], [facility] Health RN Care Manager Direct Dial: [phone]  Fax: [phone] Website: [email or web address]

## 2024-03-24 NOTE — Transitions of Care (Post Inpatient/ED Visit) (Signed)
 Transition of Care Follow up  Visit Note  03/24/2024  Name: Misty Ferguson MRN: [id number]          DOB: 07-03-1934  Situation: Patient enrolled in [facility] 30-day program. Visit completed with patient and spouse by telephone.   Background:   Initial Transition Care Management Follow-up Telephone Call    Past Medical History:  Diagnosis Date   AK (actinic keratosis) 12/08/2020   right pretibia inferior bx proven, LN2 01/10/21   Burping    Chronic airway obstruction, not elsewhere classified    Dyspnea    Dysrhythmia    Macular degeneration (senile) of retina, unspecified    Malignant neoplasm of urinary bladder (HCC) 03/2019   Partial bladder resection and Rad tx's.    Obstructive chronic bronchitis with exacerbation (HCC)    Osteoporosis, unspecified    Oxygen  deficiency    Personal history of peptic ulcer disease    SCC (squamous cell carcinoma) 12/08/2020   right pretibia superior, EDC 01/10/21   SCC (squamous cell carcinoma) 07/03/2022   left medial calf, clear 09/25/22   SCC (squamous cell carcinoma) 11/22/2022   right thigh   treated with ED&C   Squamous cell carcinoma in situ (SCCIS) 07/03/2022   right pretibia sccis needs treatment with fluorouracil    Tobacco use disorder    Unspecified essential hypertension    Unspecified glaucoma(365.9)     Assessment: Patient Reported Symptoms: Cognitive Cognitive Status: Alert and oriented to person, place, and time, Normal speech and language skills      Neurological      HEENT        Cardiovascular      Respiratory      Endocrine      Gastrointestinal        Genitourinary      Integumentary Other Integumentary Symptoms: Skin tear to right hand-steri strips.  Right elbow-using xeroform gauze and wrapping.  Right knee and left groin dressing intact. Skin Management Strategies: Dressing changes (spouse doing dressing changes) Skin Self-Management Outcome: 4 (good)  Musculoskeletal          Psychosocial            There were no vitals filed for this visit.  Medications Reviewed Today     Reviewed by Latiesha Harada, RN (Case Manager) on 03/24/24 at 1240  Med List Status: <None>   Medication Order Taking? Sig Documenting Provider Last Dose Status Informant  Acetylcysteine (NAC 600 PO) [id number]  Take by mouth. [provider]  Active Self, Spouse/Significant Other, Pharmacy Records  albuterol  (PROVENTIL ) (2.5 MG/3ML) 0.083% nebulizer solution [id number]  INHALE THE CONTENTS OF 1 VIAL VIA NEBULIZER EVERY 4 HOURS AS NEEDED FOR WHEEZING OR SHORTNESS OF BREATH. Kasa, Kurian, MD  Active Self, Spouse/Significant Other, Pharmacy Records  albuterol  (VENTOLIN  HFA) 108 [id number] Base) MCG/ACT inhaler [id number]  Inhale 2 puffs into the lungs every 6 (six) hours as needed for wheezing or shortness of breath. Kasa, Kurian, MD  Active Self, Spouse/Significant Other, Pharmacy Records  ascorbic acid (VITAMIN C) 1000 MG tablet [id number]  Take 1,000 mg by mouth daily.  [provider]  Active Self, Spouse/Significant Other, Pharmacy Records  aspirin  EC 81 MG tablet [id number]  Take 1 tablet (81 mg total) by mouth daily. Swallow whole. Marea Selinda RAMAN, MD  Active Self, Spouse/Significant Other, Pharmacy Records  atorvastatin  (LIPITOR) 10 MG tablet [id number]  TAKE 1 TABLET EVERY DAY Dew, Jason S, MD  Active Self, Spouse/Significant Other, Pharmacy Records  Azelastine  HCl 137 MCG/SPRAY SOLN  [id number]  Place 2 sprays into both nostrils 2 (two) times daily. Kasa, Kurian, MD  Active Self, Spouse/Significant Other, Pharmacy Records  budesonide -glycopyrrolate -formoterol  (BREZTRI  AEROSPHERE) 160-9-4.8 MCG/ACT AERO inhaler [id number]  Inhale 2 puffs into the lungs in the morning and at bedtime. Kasa, Kurian, MD  Active Self, Spouse/Significant Other, Pharmacy Records  budesonide -glycopyrrolate -formoterol  (BREZTRI  AEROSPHERE) 160-9-4.8 MCG/ACT AERO inhaler [id number]  Inhale 2 puffs into the lungs 2 (two) times daily. Avelina Greig BRAVO, MD  Active Self, Spouse/Significant Other, Pharmacy Records  Cholecalciferol  (VITAMIN D ) 125 MCG (5000 UT) CAPS [id number]  Take 5,000 Units by mouth daily. [provider]  Active Self, Spouse/Significant Other, Pharmacy Records  clopidogrel  (PLAVIX ) 75 MG tablet [id number]  TAKE 1 TABLET EVERY DAY Dew, Jason S, MD  Active Self, Spouse/Significant Other, Pharmacy Records  Coenzyme Q10 (COQ10) 200 MG CAPS [id number]  Take by mouth. [provider]  Active Self, Spouse/Significant Other, Pharmacy Records  guaiFENesin  (MUCINEX ) 600 MG 12 hr tablet [id number]  Take 600 mg by mouth 2 (two) times daily. [provider]  Active Spouse/Significant Other, Self, Pharmacy Records           Med Note RAYNE ARLEY HERO   Sat Mar 14, 2024  3:49 PM) PRN  melatonin 5 MG TABS [id number]  Take 1 tablet (5 mg total) by mouth at bedtime. Laurita Pillion, MD  Active   Multiple Vitamins-Minerals (PRESERVISION AREDS) TABS [id number]  Take 1 tablet by mouth 2 (two) times daily. [provider]  Active Self, Spouse/Significant Other, Pharmacy Records  OXYGEN  [id number]  Inhale 4 L into the lungs daily in the afternoon. [provider]  Active Self, Spouse/Significant Other, Pharmacy Records  pantoprazole  (PROTONIX ) 40 MG tablet [id number]  Take 1 tablet (40 mg total) by mouth daily. Laurita Pillion, MD  Active   [facility] Sprint Nebulizer Set MISC [id number]   [provider]  Active Self, Spouse/Significant Other, Pharmacy Records  polyethylene glycol (MIRALAX  / GLYCOLAX ) 17 g packet [id number]  Take 17 g by mouth daily. Laurita Pillion, MD  Active   QUEtiapine  (SEROQUEL ) 25 MG tablet [id number]  Take 1 tablet (25 mg total) by mouth at bedtime. Laurita Pillion, MD  Active   senna-docusate (SENOKOT-S) 8.6-50 MG tablet [id number]  Take 1 tablet by mouth 2 (two) times daily as needed for mild constipation. Laurita Pillion, MD  Active   verapamil  (VERELAN  PM) 360 MG 24 hr capsule [id number]  Take 360  mg by mouth daily. [provider]  Active Self, Spouse/Significant Other, Pharmacy Records  Vitamin E 180 MG CAPS [id number]  Take 180 mg by mouth daily. [provider]  Active Self, Spouse/Significant Other, Pharmacy Records            Goals Addressed             This Visit's Progress    VBCI Transitions of Care (TOC) Care Plan       Problems:  Recent Hospitalization for treatment of COPD and pneumonia and small bowel obstruction Knowledge Deficit Related to Pneumonia/COPD and bowel obstruction Home Health Start of care 03/25/24  03/24/24 Patient okay today.  Breathing is good.  Discussed home health start of care date as 03/25/24.  Patient and spouse in agreement.  Patient has some pain at times and not sleeping at night.  Message to physician requesting pain and something for sleep.   Advised to use tylenol  as needed.    Reviewed COPD and bowel management. She verbalized understanding.  RN CM will follow  up on 03/31/24. Goal:  Over the next 30 days, the patient will not experience hospital readmission  Interventions:  Transitions of Care: Doctor Visits  - discussed the importance of doctor visits Communication with [facility] home health re: Start of care: Larraine Blush representative confirmed start of care date 03/25/24 Reviewed Signs and symptoms of infection Oxygen  use- keeping oxygen  levels above 80. Encouraged patient to pace self with activity. Reviewed importance of medication compliance. Reviewed when to call for physician for increased shortness of breath, increased sputum, fatigue that is not normal, cough, fever or sick contacts.   COPD Interventions: Provided education about and advised patient to utilize infection prevention strategies to reduce risk of respiratory infection Provided instruction about proper use of medications used for management of COPD including inhalers Provided written and verbal instructions on pursed lip breathing and utilized  returned demonstration as teach back Use of home oxygen   Patient Self Care Activities:  Attend all scheduled provider appointments Call provider office for new concerns or questions  Notify RN Care Manager of TOC call rescheduling needs Participate in Transition of Care Program/Attend TOC scheduled calls Take medications as prescribed   do breathing exercises every day keep follow-up appointments: PCP 8/15 and Pulmonologist 8/26  do breathing exercises at least 2 times each day Uses smart vest as ordered BIDx  Plan:  The patient has been provided with contact information for the care management team and has been advised to call with any health related questions or concerns.          Recommendation:   Continue Current Plan of Care  Follow Up Plan:   Telephone follow-up in 1 week  Milyn Stapleton J. Sanjuan Sawa RN, MSN [facility], [facility] Health RN Care Manager Direct Dial: [phone]  Fax: [phone] Website: [email or web address]

## 2024-03-25 ENCOUNTER — Telehealth: Payer: Self-pay | Admitting: *Deleted

## 2024-03-25 ENCOUNTER — Telehealth: Payer: Self-pay

## 2024-03-25 DIAGNOSIS — J189 Pneumonia, unspecified organism: Secondary | ICD-10-CM | POA: Diagnosis not present

## 2024-03-25 DIAGNOSIS — J9621 Acute and chronic respiratory failure with hypoxia: Secondary | ICD-10-CM | POA: Diagnosis not present

## 2024-03-25 DIAGNOSIS — M800AXD Age-related osteoporosis with current pathological fracture, other site, subsequent encounter for fracture with routine healing: Secondary | ICD-10-CM | POA: Diagnosis not present

## 2024-03-25 DIAGNOSIS — S51011D Laceration without foreign body of right elbow, subsequent encounter: Secondary | ICD-10-CM | POA: Diagnosis not present

## 2024-03-25 DIAGNOSIS — J440 Chronic obstructive pulmonary disease with acute lower respiratory infection: Secondary | ICD-10-CM | POA: Diagnosis not present

## 2024-03-25 DIAGNOSIS — J470 Bronchiectasis with acute lower respiratory infection: Secondary | ICD-10-CM | POA: Diagnosis not present

## 2024-03-25 DIAGNOSIS — J439 Emphysema, unspecified: Secondary | ICD-10-CM | POA: Diagnosis not present

## 2024-03-25 DIAGNOSIS — S81011D Laceration without foreign body, right knee, subsequent encounter: Secondary | ICD-10-CM | POA: Diagnosis not present

## 2024-03-25 DIAGNOSIS — J441 Chronic obstructive pulmonary disease with (acute) exacerbation: Secondary | ICD-10-CM | POA: Diagnosis not present

## 2024-03-25 NOTE — Telephone Encounter (Signed)
-----   Message from Greig Ring sent at 03/25/2024  8:36 AM EDT ----- Regarding: FW: Patient pain and sleep Please contact patient to triage her leg pain.  I believe it is probably chronic leg pain associated with peripheral artery disease, but make sure she is not having new swelling unilaterally or signs of DVT. There are multiple options we can do for her pain medication as well as sleep.  If she is agreeable it may be best to have her come in to speak about the different options or at least have a virtual visit. ----- Message ----- From: Leath, Dionne, RN Sent: 03/24/2024  12:35 PM EDT To: Greig FORBES Ring, MD Subject: Patient pain and sleep                         Hello,  I have been speaking with Mrs. Campise and her husband.  Mrs. Rushlow states she had pain medication in the hospital for her legs and feet.  She is having pain 5/10. She is requesting something for pain.  Can you assist?  I know she is scheduled with you on 8/15 for hospital follow up.    She also is stating that she is not sleeping at night and would like something to help her sleep.    Dionne J. Leath RN, MSN [facility], [facility] Health RN Care Manager Direct Dial: [phone]  [phone] Website: [email or web address]

## 2024-03-25 NOTE — Telephone Encounter (Addendum)
 Return Misty Ferguson call.  Got unidentified voicemail so I was unable to leave orders.  No agency name was provided so I am not able to contact agency to give order.   I left message on voicemail to return call to office.   Will await return call.  FYI to Dr. Avelina.

## 2024-03-25 NOTE — Telephone Encounter (Signed)
 Copied from CRM #[id number]. Topic: Clinical - Home Health Verbal Orders >> Mar 25, 2024 12:44 PM Rea ORN wrote: Caller/Agency: [facility] Callback Number: [phone] Service Requested: Physical Therapy Frequency: For Nursing- Once week for 1 week  For Physical Therapy- Once a week for 5 weeks, Once every other week for 4 weeks  Any new concerns about the patient? No

## 2024-03-25 NOTE — Telephone Encounter (Signed)
 Noted

## 2024-03-25 NOTE — Telephone Encounter (Signed)
 I spoke with pt; pt notified as instructed and pt voiced understanding.pt said this lower leg and heel pain in both legs is an ongoing problem for months. Pt said the lower legs hurt consistently and is a dull pain. No new redness in lower legs; pt has discolored lower legs due to past vascular problems per pt. No swelling. Offered pt in person or virtual appt and pt said that physical therapy is coming sometime today; does not know definite time yet and pt said to tell Dr Avelina she is going to wait and  discuss the legs hurting and sleep issues when she comes in on 03/27/24. UC & ED precautions given and pt voiced understanding. Sending note to Dr Avelina and [place] pool.

## 2024-03-26 DIAGNOSIS — J189 Pneumonia, unspecified organism: Secondary | ICD-10-CM | POA: Diagnosis not present

## 2024-03-26 DIAGNOSIS — J439 Emphysema, unspecified: Secondary | ICD-10-CM | POA: Diagnosis not present

## 2024-03-26 DIAGNOSIS — J9621 Acute and chronic respiratory failure with hypoxia: Secondary | ICD-10-CM | POA: Diagnosis not present

## 2024-03-26 DIAGNOSIS — M800AXD Age-related osteoporosis with current pathological fracture, other site, subsequent encounter for fracture with routine healing: Secondary | ICD-10-CM | POA: Diagnosis not present

## 2024-03-26 DIAGNOSIS — J470 Bronchiectasis with acute lower respiratory infection: Secondary | ICD-10-CM | POA: Diagnosis not present

## 2024-03-26 DIAGNOSIS — S51011D Laceration without foreign body of right elbow, subsequent encounter: Secondary | ICD-10-CM | POA: Diagnosis not present

## 2024-03-26 DIAGNOSIS — J440 Chronic obstructive pulmonary disease with acute lower respiratory infection: Secondary | ICD-10-CM | POA: Diagnosis not present

## 2024-03-26 DIAGNOSIS — S81011D Laceration without foreign body, right knee, subsequent encounter: Secondary | ICD-10-CM | POA: Diagnosis not present

## 2024-03-26 DIAGNOSIS — J441 Chronic obstructive pulmonary disease with (acute) exacerbation: Secondary | ICD-10-CM | POA: Diagnosis not present

## 2024-03-26 NOTE — Telephone Encounter (Signed)
 Noted

## 2024-03-27 ENCOUNTER — Encounter: Payer: Self-pay | Admitting: Family Medicine

## 2024-03-27 ENCOUNTER — Ambulatory Visit (INDEPENDENT_AMBULATORY_CARE_PROVIDER_SITE_OTHER): Admitting: Family Medicine

## 2024-03-27 ENCOUNTER — Inpatient Hospital Stay: Admitting: Family Medicine

## 2024-03-27 ENCOUNTER — Telehealth: Payer: Self-pay

## 2024-03-27 VITALS — BP 130/60 | HR 81 | Temp 98.8°F | Ht 61.0 in

## 2024-03-27 DIAGNOSIS — K56609 Unspecified intestinal obstruction, unspecified as to partial versus complete obstruction: Secondary | ICD-10-CM | POA: Diagnosis not present

## 2024-03-27 DIAGNOSIS — R7989 Other specified abnormal findings of blood chemistry: Secondary | ICD-10-CM | POA: Diagnosis not present

## 2024-03-27 DIAGNOSIS — J189 Pneumonia, unspecified organism: Secondary | ICD-10-CM | POA: Diagnosis not present

## 2024-03-27 DIAGNOSIS — S81011A Laceration without foreign body, right knee, initial encounter: Secondary | ICD-10-CM | POA: Diagnosis not present

## 2024-03-27 DIAGNOSIS — J9621 Acute and chronic respiratory failure with hypoxia: Secondary | ICD-10-CM | POA: Diagnosis not present

## 2024-03-27 DIAGNOSIS — D500 Iron deficiency anemia secondary to blood loss (chronic): Secondary | ICD-10-CM

## 2024-03-27 DIAGNOSIS — E43 Unspecified severe protein-calorie malnutrition: Secondary | ICD-10-CM

## 2024-03-27 DIAGNOSIS — L97912 Non-pressure chronic ulcer of unspecified part of right lower leg with fat layer exposed: Secondary | ICD-10-CM | POA: Diagnosis not present

## 2024-03-27 DIAGNOSIS — T148XXA Other injury of unspecified body region, initial encounter: Secondary | ICD-10-CM

## 2024-03-27 DIAGNOSIS — I70222 Atherosclerosis of native arteries of extremities with rest pain, left leg: Secondary | ICD-10-CM | POA: Diagnosis not present

## 2024-03-27 MED ORDER — "HYDROFERA BLUE 4""X4"" EX PADS": MEDICATED_PAD | CUTANEOUS | 0 refills | Status: DC

## 2024-03-27 MED ORDER — TRAMADOL HCL 50 MG PO TABS: 50.0000 mg | ORAL_TABLET | Freq: Three times a day (TID) | ORAL | 0 refills | Status: AC | PRN

## 2024-03-27 MED ORDER — SANTYL 250 UNIT/GM EX OINT: 1.0000 | TOPICAL_OINTMENT | Freq: Every day | CUTANEOUS | 0 refills | Status: DC

## 2024-03-27 MED ORDER — TRAZODONE HCL 50 MG PO TABS: 25.0000 mg | ORAL_TABLET | Freq: Every evening | ORAL | 3 refills | Status: DC | PRN

## 2024-03-27 NOTE — Assessment & Plan Note (Signed)
 Acute, no further issues now using MiraLAX  once daily along with senna.  We discussed that she can use MiraLAX  long-term as long is not tending towards diarrhea.

## 2024-03-27 NOTE — Progress Notes (Signed)
 Patient ID: Misty Ferguson, female    DOB: May 27, 1934, 89 y.o.   MRN: [id number]  This visit was conducted in person.  BP 130/60   Pulse 81   Temp 98.8 F (37.1 C) (Temporal)   Ht 5' 1 (1.549 m)   SpO2 95% Comment: 2.5 L O2 On Demand  BMI 17.58 kg/m    CC:  Chief Complaint  Patient presents with   Hospitalization Follow-up    Subjective:   HPI: Misty Ferguson is a 89 y.o. female presenting on 03/27/2024 for Hospitalization Follow-up  Recent hospitalization for community-acquired pneumonia with acute on chronic respiratory failure with hypoxemia in setting of severe COPD emphysema. She was admitted on August 2 and discharged on August 10 Initially seen by EMS after a fall and was found to be in respiratory distress.  Fall resulted in multiple skin tears. Treated initially with BiPAP and then transition to nasal cannula. On August 6 given moderate partially loculated left pleural effusion, she underwent left thoracentesis with 1.1 L fluid removed.  Consistent with exudate, no evidence of infection. During hospitalization she completed cefepime  and azithromycin  x 5 days.  Left lower extremity Doppler showed no dopplerable pulse.  Seen in hospital by Dr. Marea and started on heparin  IV infusion.  Underwent angioplasty and stent in the left SFA and left profunda femoris artery on August 4 Recommended continuation of dual antiplatelet therapy.  Also during hospitalization she was found to have a small bowel obstruction per [facility].  CT scan A/P showed small bowel obstruction with dilated proximal small bowel measuring up to 3.6 cm. General surgery was consulted.  Small bowel follow-through showed contrast material throughout the colon with decompressed small bowel. On reevaluation small bowel obstruction  resolved Recommended continuation of MiraLAX  and addition of senna.  Continued on Protonix  but added sucralfate  for reflux.    Anemia and hospitalization felt to be acute blood loss  for procedure and given 2 units transfusion.  For insomnia trazodone  was stopped and changed to Seroquel . Helped inititally, no not effective.  Elevated liver enzymes during hospitalization: Ultrasound showed steatohepatitis without neoplasm.  Since she has been discharged pain in low back, butt, and  legs and feet is terrible... She feels it is because she is having to sit still.  Pain at night is the worst at night, tolerable duing the day.  Doing home PT and doing respiratory vest.  [facility]  PT and RN should be coming out soon.   Since she has been home breathing has been good .. On 2-3 L Ida.  Surgical sites healing well.   Having daily BMs. Eating well.  Wound on right knee slow healing. Right arm and right hand initially injured with dfall.  Facial bruise.. no HA or concussion symptoms.  Has pulmonary and vascular appt next  Relevant past medical, surgical, family and social history reviewed and updated as indicated. Interim medical history since our last visit reviewed. Allergies and medications reviewed and updated. Outpatient Medications Prior to Visit  Medication Sig Dispense Refill   Acetylcysteine (NAC 600 PO) Take by mouth.     albuterol  (PROVENTIL ) (2.5 MG/3ML) 0.083% nebulizer solution INHALE THE CONTENTS OF 1 VIAL VIA NEBULIZER EVERY 4 HOURS AS NEEDED FOR WHEEZING OR SHORTNESS OF BREATH. 450 mL 10   albuterol  (VENTOLIN  HFA) 108 (90 Base) MCG/ACT inhaler Inhale 2 puffs into the lungs every 6 (six) hours as needed for wheezing or shortness of breath. 8 g 6   ascorbic acid (  VITAMIN C) 1000 MG tablet Take 1,000 mg by mouth daily.      aspirin  EC 81 MG tablet Take 1 tablet (81 mg total) by mouth daily. Swallow whole. 150 tablet 2   atorvastatin  (LIPITOR) 10 MG tablet TAKE 1 TABLET EVERY DAY 90 tablet 3   Azelastine  HCl 137 MCG/SPRAY SOLN Place 2 sprays into both nostrils 2 (two) times daily. 100 mL 5   budesonide -glycopyrrolate -formoterol  (BREZTRI  AEROSPHERE) 160-9-4.8  MCG/ACT AERO inhaler Inhale 2 puffs into the lungs in the morning and at bedtime.     budesonide -glycopyrrolate -formoterol  (BREZTRI  AEROSPHERE) 160-9-4.8 MCG/ACT AERO inhaler Inhale 2 puffs into the lungs 2 (two) times daily. 3 each 3   Cholecalciferol  (VITAMIN D ) 125 MCG (5000 UT) CAPS Take 5,000 Units by mouth daily.     clopidogrel  (PLAVIX ) 75 MG tablet TAKE 1 TABLET EVERY DAY 90 tablet 3   Coenzyme Q10 (COQ10) 200 MG CAPS Take by mouth.     guaiFENesin  (MUCINEX ) 600 MG 12 hr tablet Take 600 mg by mouth 2 (two) times daily.     melatonin 5 MG TABS Take 1 tablet (5 mg total) by mouth at bedtime. 30 tablet 0   Multiple Vitamins-Minerals (PRESERVISION AREDS) TABS Take 1 tablet by mouth 2 (two) times daily.     OXYGEN  Inhale 4 L into the lungs daily in the afternoon.     pantoprazole  (PROTONIX ) 40 MG tablet Take 1 tablet (40 mg total) by mouth daily. 14 tablet 0   Pari LC Sprint Nebulizer Set MISC      polyethylene glycol (MIRALAX  / GLYCOLAX ) 17 g packet Take 17 g by mouth daily. 14 each 0   senna-docusate (SENOKOT-S) 8.6-50 MG tablet Take 1 tablet by mouth 2 (two) times daily as needed for mild constipation. 60 tablet 0   verapamil  (VERELAN  PM) 360 MG 24 hr capsule Take 360 mg by mouth daily.     Vitamin E 180 MG CAPS Take 180 mg by mouth daily.     QUEtiapine  (SEROQUEL ) 25 MG tablet Take 1 tablet (25 mg total) by mouth at bedtime. 30 tablet 0   No facility-administered medications prior to visit.     Per HPI unless specifically indicated in ROS section below Review of Systems  Constitutional:  Positive for fatigue. Negative for fever.  HENT:  Negative for congestion.   Eyes:  Negative for pain.  Respiratory:  Positive for cough and shortness of breath.   Cardiovascular:  Negative for chest pain, palpitations and leg swelling.  Gastrointestinal:  Negative for abdominal pain.  Genitourinary:  Negative for dysuria and vaginal bleeding.  Musculoskeletal:  Negative for back pain.  Skin:   Positive for wound.  Neurological:  Positive for weakness. Negative for syncope, light-headedness and headaches.  Psychiatric/Behavioral:  Negative for dysphoric mood.    Objective:  BP 130/60   Pulse 81   Temp 98.8 F (37.1 C) (Temporal)   Ht 5' 1 (1.549 m)   SpO2 95% Comment: 2.5 L O2 On Demand  BMI 17.58 kg/m   Wt Readings from Last 3 Encounters:  03/16/24 93 lb 0.6 oz (42.2 kg)  02/10/24 89 lb (40.4 kg)  01/23/24 90 lb (40.8 kg)      Physical Exam Constitutional:      General: She is not in acute distress.    Appearance: Normal appearance. She is well-developed and underweight. She is ill-appearing. She is not toxic-appearing.  HENT:     Head: Normocephalic.     Right Ear: Hearing, tympanic membrane,  ear canal and external ear normal. Tympanic membrane is not erythematous, retracted or bulging.     Left Ear: Hearing, tympanic membrane, ear canal and external ear normal. Tympanic membrane is not erythematous, retracted or bulging.     Nose: No mucosal edema or rhinorrhea.     Right Sinus: No maxillary sinus tenderness or frontal sinus tenderness.     Left Sinus: No maxillary sinus tenderness or frontal sinus tenderness.     Mouth/Throat:     Pharynx: Uvula midline.  Eyes:     General: Lids are normal. Lids are everted, no foreign bodies appreciated.     Conjunctiva/sclera: Conjunctivae normal.     Pupils: Pupils are equal, round, and reactive to light.  Neck:     Thyroid : No thyroid  mass or thyromegaly.     Vascular: No carotid bruit.     Trachea: Trachea normal.  Cardiovascular:     Rate and Rhythm: Normal rate and regular rhythm.     Pulses: Normal pulses.     Heart sounds: Normal heart sounds, S1 normal and S2 normal. No murmur heard.    No friction rub. No gallop.  Pulmonary:     Effort: Pulmonary effort is normal. No tachypnea or respiratory distress.     Breath sounds: Normal breath sounds. No decreased breath sounds, wheezing, rhonchi or rales.  Abdominal:      General: Bowel sounds are normal.     Palpations: Abdomen is soft.     Tenderness: There is no abdominal tenderness.  Musculoskeletal:     Cervical back: Normal range of motion and neck supple.  Skin:    General: Skin is warm and dry.     Findings: No rash.     Comments: Well healing wound on right upper extremity,  2 lesions on right knee documented with photos  Neurological:     Mental Status: She is alert.  Psychiatric:        Mood and Affect: Mood is not anxious or depressed.        Speech: Speech normal.        Behavior: Behavior normal. Behavior is cooperative.        Thought Content: Thought content normal.        Judgment: Judgment normal.            Results for orders placed or performed during the hospital encounter of 03/14/24  Blood gas, venous   Collection Time: 03/14/24  7:08 AM  Result Value Ref Range   pH, Ven 7.29 7.25 - 7.43   pCO2, Ven 52 44 - 60 mmHg   pO2, Ven 51 (H) 32 - 45 mmHg   Bicarbonate 25.0 20.0 - 28.0 mmol/L   Acid-base deficit 2.3 (H) 0.0 - 2.0 mmol/L   O2 Saturation 77.6 %   Patient temperature 37.0    Collection site VEIN   Brain natriuretic peptide   Collection Time: 03/14/24  7:08 AM  Result Value Ref Range   B Natriuretic Peptide 92.6 0.0 - 100.0 pg/mL  CBC   Collection Time: 03/14/24  7:09 AM  Result Value Ref Range   WBC 11.7 (H) 4.0 - 10.5 K/uL   RBC 4.18 3.87 - 5.11 MIL/uL   Hemoglobin 12.9 12.0 - 15.0 g/dL   HCT 59.3 63.9 - 53.9 %   MCV 97.1 80.0 - 100.0 fL   MCH 30.9 26.0 - 34.0 pg   MCHC 31.8 30.0 - 36.0 g/dL   RDW 86.2 88.4 - 84.4 %  Platelets 390 150 - 400 K/uL   nRBC 0.0 0.0 - 0.2 %  Comprehensive metabolic panel   Collection Time: 03/14/24  7:09 AM  Result Value Ref Range   Sodium 140 135 - 145 mmol/L   Potassium 4.7 3.5 - 5.1 mmol/L   Chloride 104 98 - 111 mmol/L   CO2 25 22 - 32 mmol/L   Glucose, Bld 176 (H) 70 - 99 mg/dL   BUN 21 8 - 23 mg/dL   Creatinine, Ser 9.35 0.44 - 1.00 mg/dL   Calcium  9.2  8.9 - 10.3 mg/dL   Total Protein 5.9 (L) 6.5 - 8.1 g/dL   Albumin 3.5 3.5 - 5.0 g/dL   AST 20 15 - 41 U/L   ALT 19 0 - 44 U/L   Alkaline Phosphatase 89 38 - 126 U/L   Total Bilirubin 0.4 0.0 - 1.2 mg/dL   GFR, Estimated >39 >39 mL/min   Anion gap 11 5 - 15  APTT   Collection Time: 03/14/24  7:09 AM  Result Value Ref Range   aPTT 30 24 - 36 seconds  Protime-INR   Collection Time: 03/14/24  7:09 AM  Result Value Ref Range   Prothrombin Time 13.5 11.4 - 15.2 seconds   INR 1.0 0.8 - 1.2  Troponin I (High Sensitivity)   Collection Time: 03/14/24  7:09 AM  Result Value Ref Range   Troponin I (High Sensitivity) 7 <18 ng/L  Blood culture (routine x 2)   Collection Time: 03/14/24 10:10 AM   Specimen: BLOOD LEFT ARM  Result Value Ref Range   Specimen Description BLOOD LEFT ARM    Special Requests      BOTTLES DRAWN AEROBIC AND ANAEROBIC Blood Culture results may not be optimal due to an inadequate volume of blood received in culture bottles   Culture      NO GROWTH 5 DAYS Performed at [facility], [address] [place], [place] [address]    Report Status 03/19/2024 FINAL   Blood culture (routine x 2)   Collection Time: 03/14/24 10:10 AM   Specimen: BLOOD RIGHT ARM  Result Value Ref Range   Specimen Description BLOOD RIGHT ARM    Special Requests      BOTTLES DRAWN AEROBIC AND ANAEROBIC Blood Culture results may not be optimal due to an inadequate volume of blood received in culture bottles   Culture      NO GROWTH 5 DAYS Performed at [facility], [address] [place], [place] [address]    Report Status 03/19/2024 FINAL   Lactic acid, plasma   Collection Time: 03/14/24 10:10 AM  Result Value Ref Range   Lactic Acid, Venous 2.0 (HH) 0.5 - 1.9 mmol/L  Lactic acid, plasma   Collection Time: 03/14/24  1:20 PM  Result Value Ref Range   Lactic Acid, Venous 1.7 0.5 - 1.9 mmol/L  Respiratory (~20 pathogens) panel by PCR   Collection Time: 03/14/24  3:39  PM   Specimen: Nasopharyngeal Swab; Respiratory  Result Value Ref Range   Adenovirus NOT DETECTED NOT DETECTED   Coronavirus 229E NOT DETECTED NOT DETECTED   Coronavirus HKU1 NOT DETECTED NOT DETECTED   Coronavirus NL63 NOT DETECTED NOT DETECTED   Coronavirus OC43 NOT DETECTED NOT DETECTED   Metapneumovirus NOT DETECTED NOT DETECTED   Rhinovirus / Enterovirus NOT DETECTED NOT DETECTED   Influenza A NOT DETECTED NOT DETECTED   Influenza B NOT DETECTED NOT DETECTED   Parainfluenza Virus 1 NOT DETECTED NOT DETECTED  Parainfluenza Virus 2 NOT DETECTED NOT DETECTED   Parainfluenza Virus 3 NOT DETECTED NOT DETECTED   Parainfluenza Virus 4 NOT DETECTED NOT DETECTED   Respiratory Syncytial Virus NOT DETECTED NOT DETECTED   Bordetella pertussis NOT DETECTED NOT DETECTED   Bordetella Parapertussis NOT DETECTED NOT DETECTED   Chlamydophila pneumoniae NOT DETECTED NOT DETECTED   Mycoplasma pneumoniae NOT DETECTED NOT DETECTED  Expectorated Sputum Assessment w Gram Stain, Rflx to Resp Cult   Collection Time: 03/14/24  3:40 PM   Specimen: Sputum  Result Value Ref Range   Specimen Description SPUTUM    Special Requests NONE    Sputum evaluation      Sputum specimen not acceptable for testing.  Please recollect.   CALLED TO JES SAVAGE @1750  03/14/24 MJU Performed at [facility], [address] [place], [place] [address]    Report Status 03/14/2024 FINAL   Heparin  level (unfractionated)   Collection Time: 03/14/24  7:14 PM  Result Value Ref Range   Heparin  Unfractionated 0.46 0.30 - 0.70 IU/mL  Heparin  level (unfractionated)   Collection Time: 03/15/24  7:14 AM  Result Value Ref Range   Heparin  Unfractionated 0.47 0.30 - 0.70 IU/mL  CBC   Collection Time: 03/15/24  7:14 AM  Result Value Ref Range   WBC 5.7 4.0 - 10.5 K/uL   RBC 2.78 (L) 3.87 - 5.11 MIL/uL   Hemoglobin 8.7 (L) 12.0 - 15.0 g/dL   HCT 73.4 (L) 63.9 - 53.9 %   MCV 95.3 80.0 - 100.0 fL   MCH 31.3 26.0 - 34.0 pg    MCHC 32.8 30.0 - 36.0 g/dL   RDW 86.1 88.4 - 84.4 %   Platelets 255 150 - 400 K/uL   nRBC 0.0 0.0 - 0.2 %  Basic metabolic panel with GFR   Collection Time: 03/15/24  9:03 AM  Result Value Ref Range   Sodium 140 135 - 145 mmol/L   Potassium 4.4 3.5 - 5.1 mmol/L   Chloride 105 98 - 111 mmol/L   CO2 26 22 - 32 mmol/L   Glucose, Bld 149 (H) 70 - 99 mg/dL   BUN 22 8 - 23 mg/dL   Creatinine, Ser 9.37 0.44 - 1.00 mg/dL   Calcium  8.8 (L) 8.9 - 10.3 mg/dL   GFR, Estimated >39 >39 mL/min   Anion gap 9 5 - 15  CBC   Collection Time: 03/15/24  9:03 AM  Result Value Ref Range   WBC 6.5 4.0 - 10.5 K/uL   RBC 2.96 (L) 3.87 - 5.11 MIL/uL   Hemoglobin 9.1 (L) 12.0 - 15.0 g/dL   HCT 71.5 (L) 63.9 - 53.9 %   MCV 95.9 80.0 - 100.0 fL   MCH 30.7 26.0 - 34.0 pg   MCHC 32.0 30.0 - 36.0 g/dL   RDW 85.9 88.4 - 84.4 %   Platelets 272 150 - 400 K/uL   nRBC 0.0 0.0 - 0.2 %  Magnesium    Collection Time: 03/15/24  9:03 AM  Result Value Ref Range   Magnesium  1.9 1.7 - 2.4 mg/dL  Phosphorus   Collection Time: 03/15/24  9:03 AM  Result Value Ref Range   Phosphorus 3.2 2.5 - 4.6 mg/dL  Vitamin B12   Collection Time: 03/15/24  9:03 AM  Result Value Ref Range   Vitamin B-12 224 180 - 914 pg/mL  VITAMIN D  25 Hydroxy (Vit-D Deficiency, Fractures)   Collection Time: 03/15/24  9:03 AM  Result Value Ref Range   Vit D,  25-Hydroxy 64.20 30 - 100 ng/mL  Resp panel by RT-PCR (RSV, Flu A&B, Covid) Anterior Nasal Swab   Collection Time: 03/15/24  4:10 PM   Specimen: Anterior Nasal Swab  Result Value Ref Range   SARS Coronavirus 2 by RT PCR NEGATIVE NEGATIVE   Influenza A by PCR NEGATIVE NEGATIVE   Influenza B by PCR NEGATIVE NEGATIVE   Resp Syncytial Virus by PCR NEGATIVE NEGATIVE  Blood gas, venous   Collection Time: 03/15/24  4:26 PM  Result Value Ref Range   O2 Content 4.0 L/min   Delivery systems NASAL CANNULA    pH, Ven 7.46 (H) 7.25 - 7.43   pCO2, Ven 39 (L) 44 - 60 mmHg   pO2, Ven 42 32 - 45  mmHg   Bicarbonate 27.7 20.0 - 28.0 mmol/L   Acid-Base Excess 3.7 (H) 0.0 - 2.0 mmol/L   O2 Saturation 70.8 %   Patient temperature 37.0    Collection site VEIN   Heparin  level (unfractionated)   Collection Time: 03/16/24  6:13 AM  Result Value Ref Range   Heparin  Unfractionated 0.56 0.30 - 0.70 IU/mL  Basic metabolic panel with GFR   Collection Time: 03/16/24  6:13 AM  Result Value Ref Range   Sodium 142 135 - 145 mmol/L   Potassium 4.4 3.5 - 5.1 mmol/L   Chloride 106 98 - 111 mmol/L   CO2 24 22 - 32 mmol/L   Glucose, Bld 121 (H) 70 - 99 mg/dL   BUN 24 (H) 8 - 23 mg/dL   Creatinine, Ser 9.42 0.44 - 1.00 mg/dL   Calcium  8.7 (L) 8.9 - 10.3 mg/dL   GFR, Estimated >39 >39 mL/min   Anion gap 12 5 - 15  CBC   Collection Time: 03/16/24  6:13 AM  Result Value Ref Range   WBC 9.8 4.0 - 10.5 K/uL   RBC 2.46 (L) 3.87 - 5.11 MIL/uL   Hemoglobin 7.5 (L) 12.0 - 15.0 g/dL   HCT 76.4 (L) 63.9 - 53.9 %   MCV 95.5 80.0 - 100.0 fL   MCH 30.5 26.0 - 34.0 pg   MCHC 31.9 30.0 - 36.0 g/dL   RDW 85.5 88.4 - 84.4 %   Platelets 267 150 - 400 K/uL   nRBC 0.0 0.0 - 0.2 %  Magnesium    Collection Time: 03/16/24  6:13 AM  Result Value Ref Range   Magnesium  2.0 1.7 - 2.4 mg/dL  Phosphorus   Collection Time: 03/16/24  6:13 AM  Result Value Ref Range   Phosphorus 3.5 2.5 - 4.6 mg/dL  Hemoglobin and hematocrit, blood   Collection Time: 03/16/24  4:03 PM  Result Value Ref Range   Hemoglobin 7.0 (L) 12.0 - 15.0 g/dL   HCT 78.6 (L) 63.9 - 53.9 %  ABO/Rh   Collection Time: 03/16/24  4:03 PM  Result Value Ref Range   ABO/RH(D) AB POS    DAT, IgG NOT NEEDED   Prepare RBC (crossmatch)   Collection Time: 03/16/24  4:41 PM  Result Value Ref Range   Order Confirmation      ORDER PROCESSED BY BLOOD BANK Performed at [facility], [address] [place], [place] [address]   Type and screen [facility]   Collection Time: 03/16/24  4:51 PM  Result Value Ref Range    ABO/RH(D) AB POS    Antibody Screen NEG    Sample Expiration 03/19/2024,2359    Unit Number [id number]    Blood Component Type RED  CELLS,LR    Unit division 00    Status of Unit ISSUED,FINAL    Transfusion Status OK TO TRANSFUSE    Crossmatch Result Compatible    Unit Number [id number]    Blood Component Type RED CELLS,LR    Unit division 00    Status of Unit ISSUED,FINAL    Transfusion Status OK TO TRANSFUSE    Crossmatch Result      Compatible Performed at [facility], [address] [place], [place] [address]   BPAM [facility]   Collection Time: 03/16/24  4:51 PM  Result Value Ref Range   ISSUE DATE / TIME 797491957765    Blood Product Unit Number [id number]    PRODUCT CODE E0382V00    Unit Type and Rh 0600    Blood Product Expiration Date 797491837640    ISSUE DATE / TIME 797491929057    Blood Product Unit Number [id number]    PRODUCT CODE E0382V00    Unit Type and Rh 6200    Blood Product Expiration Date 797490937640   Basic metabolic panel with GFR   Collection Time: 03/17/24  4:43 AM  Result Value Ref Range   Sodium 137 135 - 145 mmol/L   Potassium 4.7 3.5 - 5.1 mmol/L   Chloride 104 98 - 111 mmol/L   CO2 26 22 - 32 mmol/L   Glucose, Bld 134 (H) 70 - 99 mg/dL   BUN 28 (H) 8 - 23 mg/dL   Creatinine, Ser 9.14 0.44 - 1.00 mg/dL   Calcium  8.1 (L) 8.9 - 10.3 mg/dL   GFR, Estimated >39 >39 mL/min   Anion gap 7 5 - 15  CBC   Collection Time: 03/17/24  4:43 AM  Result Value Ref Range   WBC 9.2 4.0 - 10.5 K/uL   RBC 2.82 (L) 3.87 - 5.11 MIL/uL   Hemoglobin 8.1 (L) 12.0 - 15.0 g/dL   HCT 74.5 (L) 63.9 - 53.9 %   MCV 90.1 80.0 - 100.0 fL   MCH 28.7 26.0 - 34.0 pg   MCHC 31.9 30.0 - 36.0 g/dL   RDW 80.6 (H) 88.4 - 84.4 %   Platelets 218 150 - 400 K/uL   nRBC 0.5 (H) 0.0 - 0.2 %  Magnesium    Collection Time: 03/17/24  4:43 AM  Result Value Ref Range   Magnesium  2.0 1.7 - 2.4 mg/dL  Phosphorus   Collection Time: 03/17/24  4:43 AM  Result Value  Ref Range   Phosphorus 3.9 2.5 - 4.6 mg/dL  Hemoglobin and hematocrit, blood   Collection Time: 03/17/24  4:49 PM  Result Value Ref Range   Hemoglobin 7.6 (L) 12.0 - 15.0 g/dL   HCT 76.9 (L) 63.9 - 53.9 %  Basic metabolic panel with GFR   Collection Time: 03/18/24  5:29 AM  Result Value Ref Range   Sodium 138 135 - 145 mmol/L   Potassium 5.6 (H) 3.5 - 5.1 mmol/L   Chloride 105 98 - 111 mmol/L   CO2 27 22 - 32 mmol/L   Glucose, Bld 121 (H) 70 - 99 mg/dL   BUN 33 (H) 8 - 23 mg/dL   Creatinine, Ser 9.04 0.44 - 1.00 mg/dL   Calcium  8.5 (L) 8.9 - 10.3 mg/dL   GFR, Estimated 58 (L) >60 mL/min   Anion gap 6 5 - 15  Magnesium    Collection Time: 03/18/24  5:29 AM  Result Value Ref Range   Magnesium  2.3 1.7 - 2.4 mg/dL  Phosphorus   Collection Time: 03/18/24  5:29 AM  Result Value Ref Range   Phosphorus 3.0 2.5 - 4.6 mg/dL  CBC   Collection Time: 03/18/24  7:08 AM  Result Value Ref Range   WBC 10.2 4.0 - 10.5 K/uL   RBC 2.75 (L) 3.87 - 5.11 MIL/uL   Hemoglobin 8.0 (L) 12.0 - 15.0 g/dL   HCT 75.3 (L) 63.9 - 53.9 %   MCV 89.5 80.0 - 100.0 fL   MCH 29.1 26.0 - 34.0 pg   MCHC 32.5 30.0 - 36.0 g/dL   RDW 80.9 (H) 88.4 - 84.4 %   Platelets 203 150 - 400 K/uL   nRBC 0.3 (H) 0.0 - 0.2 %  Basic metabolic panel   Collection Time: 03/18/24 12:52 PM  Result Value Ref Range   Sodium 139 135 - 145 mmol/L   Potassium 5.4 (H) 3.5 - 5.1 mmol/L   Chloride 104 98 - 111 mmol/L   CO2 28 22 - 32 mmol/L   Glucose, Bld 137 (H) 70 - 99 mg/dL   BUN 36 (H) 8 - 23 mg/dL   Creatinine, Ser 8.92 (H) 0.44 - 1.00 mg/dL   Calcium  8.8 (L) 8.9 - 10.3 mg/dL   GFR, Estimated 50 (L) >60 mL/min   Anion gap 7 5 - 15  Body fluid culture w Gram Stain   Collection Time: 03/18/24  3:28 PM   Specimen: PATH Cytology Pleural fluid  Result Value Ref Range   Specimen Description      PLEURAL Performed at [facility], [address] [place], [place] [address]    Special Requests      PLEURAL Performed  at [facility], [address] [place], [place] [address]    Gram Stain NO WBC SEEN NO ORGANISMS SEEN     Culture      NO GROWTH 4 DAYS Performed at [facility], [address], [place], [place] [address]    Report Status 03/22/2024 FINAL   Lactate dehydrogenase (pleural or peritoneal fluid)   Collection Time: 03/18/24  3:28 PM  Result Value Ref Range   LD, Fluid 47 (H) 3 - 23 U/L   Fluid Type-FLDH CYTO PLEU   Body fluid cell count with differential   Collection Time: 03/18/24  3:28 PM  Result Value Ref Range   Fluid Type-FCT CYTO PLEU    Color, Fluid YELLOW YELLOW   Appearance, Fluid HAZY (A) CLEAR   Total Nucleated Cell Count, Fluid 500 cu mm   Neutrophil Count, Fluid 38 %   Lymphs, Fluid 49 %   Monocyte-Macrophage-Serous Fluid 13 %   Eos, Fluid 0 %  Protein, pleural or peritoneal fluid   Collection Time: 03/18/24  3:28 PM  Result Value Ref Range   Total protein, fluid <3.0 g/dL   Fluid Type-FTP CYTO PLEU   Pathologist smear review   Collection Time: 03/18/24  3:28 PM  Result Value Ref Range   Path Review Cytospin of pleural fluid is reviewed.   Basic metabolic panel with GFR   Collection Time: 03/19/24  3:12 AM  Result Value Ref Range   Sodium 137 135 - 145 mmol/L   Potassium 5.4 (H) 3.5 - 5.1 mmol/L   Chloride 103 98 - 111 mmol/L   CO2 27 22 - 32 mmol/L   Glucose, Bld 158 (H) 70 - 99 mg/dL   BUN 37 (H) 8 - 23 mg/dL   Creatinine, Ser 9.08 0.44 - 1.00 mg/dL   Calcium  8.5 (L) 8.9 - 10.3 mg/dL  GFR, Estimated >60 >60 mL/min   Anion gap 7 5 - 15  CBC   Collection Time: 03/19/24  3:12 AM  Result Value Ref Range   WBC 7.1 4.0 - 10.5 K/uL   RBC 2.30 (L) 3.87 - 5.11 MIL/uL   Hemoglobin 6.7 (L) 12.0 - 15.0 g/dL   HCT 78.9 (L) 63.9 - 53.9 %   MCV 91.3 80.0 - 100.0 fL   MCH 29.1 26.0 - 34.0 pg   MCHC 31.9 30.0 - 36.0 g/dL   RDW 81.7 (H) 88.4 - 84.4 %   Platelets 200 150 - 400 K/uL   nRBC 0.0 0.0 - 0.2 %  Magnesium    Collection Time: 03/19/24   3:12 AM  Result Value Ref Range   Magnesium  2.0 1.7 - 2.4 mg/dL  Phosphorus   Collection Time: 03/19/24  3:12 AM  Result Value Ref Range   Phosphorus 3.0 2.5 - 4.6 mg/dL  Hepatic function panel   Collection Time: 03/19/24  3:12 AM  Result Value Ref Range   Total Protein 4.6 (L) 6.5 - 8.1 g/dL   Albumin 2.5 (L) 3.5 - 5.0 g/dL   AST 892 (H) 15 - 41 U/L   ALT 226 (H) 0 - 44 U/L   Alkaline Phosphatase 91 38 - 126 U/L   Total Bilirubin 0.7 0.0 - 1.2 mg/dL   Bilirubin, Direct <9.8 0.0 - 0.2 mg/dL   Indirect Bilirubin NOT CALCULATED 0.3 - 0.9 mg/dL  Prepare RBC (crossmatch)   Collection Time: 03/19/24  7:42 AM  Result Value Ref Range   Order Confirmation      ORDER PROCESSED BY BLOOD BANK Performed at [facility], [address] [place], [place] [address]   Lactate dehydrogenase   Collection Time: 03/19/24  9:20 AM  Result Value Ref Range   LDH 191 98 - 192 U/L  Procalcitonin   Collection Time: 03/19/24  9:20 AM  Result Value Ref Range   Procalcitonin <0.10 ng/mL  Hemoglobin and hematocrit, blood   Collection Time: 03/19/24  4:55 PM  Result Value Ref Range   Hemoglobin 10.6 (L) 12.0 - 15.0 g/dL   HCT 68.7 (L) 63.9 - 53.9 %  Basic metabolic panel with GFR   Collection Time: 03/20/24  4:49 AM  Result Value Ref Range   Sodium 138 135 - 145 mmol/L   Potassium 4.8 3.5 - 5.1 mmol/L   Chloride 98 98 - 111 mmol/L   CO2 31 22 - 32 mmol/L   Glucose, Bld 140 (H) 70 - 99 mg/dL   BUN 41 (H) 8 - 23 mg/dL   Creatinine, Ser 9.21 0.44 - 1.00 mg/dL   Calcium  8.8 (L) 8.9 - 10.3 mg/dL   GFR, Estimated >39 >39 mL/min   Anion gap 9 5 - 15  CBC   Collection Time: 03/20/24  4:49 AM  Result Value Ref Range   WBC 7.3 4.0 - 10.5 K/uL   RBC 3.11 (L) 3.87 - 5.11 MIL/uL   Hemoglobin 9.6 (L) 12.0 - 15.0 g/dL   HCT 71.5 (L) 63.9 - 53.9 %   MCV 91.3 80.0 - 100.0 fL   MCH 30.9 26.0 - 34.0 pg   MCHC 33.8 30.0 - 36.0 g/dL   RDW 83.7 (H) 88.4 - 84.4 %   Platelets 212 150 - 400 K/uL    nRBC 0.3 (H) 0.0 - 0.2 %  Hepatic function panel   Collection Time: 03/20/24  4:49 AM  Result Value Ref Range   Total Protein 4.8 (  L) 6.5 - 8.1 g/dL   Albumin 2.6 (L) 3.5 - 5.0 g/dL   AST 99 (H) 15 - 41 U/L   ALT 248 (H) 0 - 44 U/L   Alkaline Phosphatase 113 38 - 126 U/L   Total Bilirubin 0.6 0.0 - 1.2 mg/dL   Bilirubin, Direct <9.8 0.0 - 0.2 mg/dL   Indirect Bilirubin NOT CALCULATED 0.3 - 0.9 mg/dL  Basic metabolic panel with GFR   Collection Time: 03/21/24  2:03 AM  Result Value Ref Range   Sodium 137 135 - 145 mmol/L   Potassium 4.3 3.5 - 5.1 mmol/L   Chloride 99 98 - 111 mmol/L   CO2 30 22 - 32 mmol/L   Glucose, Bld 124 (H) 70 - 99 mg/dL   BUN 39 (H) 8 - 23 mg/dL   Creatinine, Ser 9.38 0.44 - 1.00 mg/dL   Calcium  8.9 8.9 - 10.3 mg/dL   GFR, Estimated >39 >39 mL/min   Anion gap 8 5 - 15  CBC   Collection Time: 03/21/24  2:03 AM  Result Value Ref Range   WBC 11.4 (H) 4.0 - 10.5 K/uL   RBC 3.29 (L) 3.87 - 5.11 MIL/uL   Hemoglobin 10.0 (L) 12.0 - 15.0 g/dL   HCT 69.5 (L) 63.9 - 53.9 %   MCV 92.4 80.0 - 100.0 fL   MCH 30.4 26.0 - 34.0 pg   MCHC 32.9 30.0 - 36.0 g/dL   RDW 83.9 (H) 88.4 - 84.4 %   Platelets 248 150 - 400 K/uL   nRBC 0.0 0.0 - 0.2 %  Hepatic function panel   Collection Time: 03/21/24  2:03 AM  Result Value Ref Range   Total Protein 4.9 (L) 6.5 - 8.1 g/dL   Albumin 2.7 (L) 3.5 - 5.0 g/dL   AST 73 (H) 15 - 41 U/L   ALT 202 (H) 0 - 44 U/L   Alkaline Phosphatase 101 38 - 126 U/L   Total Bilirubin 0.7 0.0 - 1.2 mg/dL   Bilirubin, Direct 0.1 0.0 - 0.2 mg/dL   Indirect Bilirubin 0.6 0.3 - 0.9 mg/dL  Basic metabolic panel with GFR   Collection Time: 03/22/24  5:43 AM  Result Value Ref Range   Sodium 138 135 - 145 mmol/L   Potassium 3.9 3.5 - 5.1 mmol/L   Chloride 99 98 - 111 mmol/L   CO2 31 22 - 32 mmol/L   Glucose, Bld 102 (H) 70 - 99 mg/dL   BUN 30 (H) 8 - 23 mg/dL   Creatinine, Ser 9.24 0.44 - 1.00 mg/dL   Calcium  8.3 (L) 8.9 - 10.3 mg/dL   GFR,  Estimated >39 >39 mL/min   Anion gap 8 5 - 15  CBC   Collection Time: 03/22/24  5:43 AM  Result Value Ref Range   WBC 7.1 4.0 - 10.5 K/uL   RBC 3.08 (L) 3.87 - 5.11 MIL/uL   Hemoglobin 9.4 (L) 12.0 - 15.0 g/dL   HCT 70.3 (L) 63.9 - 53.9 %   MCV 96.1 80.0 - 100.0 fL   MCH 30.5 26.0 - 34.0 pg   MCHC 31.8 30.0 - 36.0 g/dL   RDW 84.0 (H) 88.4 - 84.4 %   Platelets 229 150 - 400 K/uL   nRBC 0.0 0.0 - 0.2 %  Hepatic function panel   Collection Time: 03/22/24  5:43 AM  Result Value Ref Range   Total Protein 4.3 (L) 6.5 - 8.1 g/dL   Albumin 2.3 (L) 3.5 - 5.0 g/dL  AST 48 (H) 15 - 41 U/L   ALT 143 (H) 0 - 44 U/L   Alkaline Phosphatase 87 38 - 126 U/L   Total Bilirubin 0.9 0.0 - 1.2 mg/dL   Bilirubin, Direct 0.1 0.0 - 0.2 mg/dL   Indirect Bilirubin 0.8 0.3 - 0.9 mg/dL    Assessment and Plan  SBO (small bowel obstruction) (HCC) Assessment & Plan: Acute, no further issues now using MiraLAX  once daily along with senna.  We discussed that she can use MiraLAX  long-term as long is not tending towards diarrhea.   Critical limb ischemia of left lower extremity (HCC) Assessment & Plan: Well-healing right groin wound bandage removed.  Patient has follow-up with vascular next week   Community acquired pneumonia, unspecified laterality Assessment & Plan: Acute, resolved status post antibiotics.  Patient is back at her respiratory baseline. Patient has follow-up with vascular next week.   Multiple skin tears Assessment & Plan: Acute, well-healing several skin tears on arm and knee.  Reviewed home wound care.   Protein-calorie malnutrition, severe (HCC) Assessment & Plan: Chronic, encouraged her to keep up with p.o. intake and protein supplementation.   Acute on chronic respiratory failure with hypoxia (HCC) Assessment & Plan: Acute worsening now improved significantly.  Back on 2 to 3 L nasal cannula oxygen  continuous   Ulcer of right leg, with fat layer exposed  (HCC) Assessment & Plan: Acute, newly noted large ulcer right anterior knee.  Concern for poor healing given in setting of chronic peripheral edema and peripheral artery disease.  Will refer to wound clinic.  Apply Santyl  to debride wound and Hydrofera Blue for absorption.  Recommended elevation of legs above heart. No ongoing needed infection.  Orders: -     AMB referral to wound care center  Elevated liver function tests Assessment & Plan: Ultrasound showed steatohepatitis.  Will continue to follow.   Iron deficiency anemia due to chronic blood loss Assessment & Plan: Acute, likely secondary to injuries as well as procedure.  Status posttransfusion in hospital Improved at discharge   Other orders -     traZODone  HCl; Take 0.5-1 tablets (25-50 mg total) by mouth at bedtime as needed for sleep.  Dispense: 30 tablet; Refill: 3 -     traMADol  HCl; Take 1 tablet (50 mg total) by mouth every 8 (eight) hours as needed for up to 5 days.  Dispense: 15 tablet; Refill: 0 -     Santyl ; Apply 1 Application topically daily.  Dispense: 15 g; Refill: 0 -     Hydrofera Blue 4x4; Apply to knee ulcer.  Dispense: 5 each; Refill: 0    Return in about 4 weeks (around 04/24/2024) for  follow up multiple issues.   Greig Ring, MD

## 2024-03-27 NOTE — Patient Instructions (Addendum)
 Stop the Protonix  when completed.  Start trazodone  for sleep. Can use tramadol  for breakthrough pain... can take at night if pain and sleep not well controlled with trazodone  alone. Stop Seroquel .  To smaller wounds apply petroleum dressing, nonstick pad and wrap dressing.  For anterior knee ulcer...apply Santyl , cut hydrofera blue  foam to size, add nonstick pad and wrap.

## 2024-03-27 NOTE — Telephone Encounter (Signed)
 Copied from CRM [id number]. Topic: Clinical - Medical Advice >> Mar 27, 2024  9:02 AM Deaijah H wrote: Reason for CRM: *no adviceDEWAINE Novak, Grayce Nurse w/ [facility] home health called in to advise Dr. Avelina patient 2 skin tears from a fall one on right elbow and side of right knee that's being treated with Silver  S. (a cream) but also has a wound on top of right knee that is deep and has some tunnels & would like Dr. Avelina to evaluate and use silver  alginate or hydrafra Blue to the wound. Please call [phone] Raelene)

## 2024-03-27 NOTE — Telephone Encounter (Signed)
 Sonya notified as instructed by telephone. States understanding.

## 2024-03-27 NOTE — Assessment & Plan Note (Signed)
 Ultrasound showed steatohepatitis.  Will continue to follow.

## 2024-03-27 NOTE — Assessment & Plan Note (Signed)
 Acute, newly noted large ulcer right anterior knee.  Concern for poor healing given in setting of chronic peripheral edema and peripheral artery disease.  Will refer to wound clinic.  Apply Santyl  to debride wound and Hydrofera Blue for absorption.  Recommended elevation of legs above heart. No ongoing needed infection.

## 2024-03-27 NOTE — Assessment & Plan Note (Addendum)
 Acute, likely secondary to injuries as well as procedure.  Status posttransfusion in hospital Improved at discharge

## 2024-03-27 NOTE — Assessment & Plan Note (Signed)
 Acute worsening now improved significantly.  Back on 2 to 3 L nasal cannula oxygen  continuous

## 2024-03-27 NOTE — Assessment & Plan Note (Addendum)
 Acute, resolved status post antibiotics.  Patient is back at her respiratory baseline. Patient has follow-up with vascular next week.

## 2024-03-27 NOTE — Assessment & Plan Note (Signed)
 Well-healing right groin wound bandage removed.  Patient has follow-up with vascular next week

## 2024-03-27 NOTE — Telephone Encounter (Addendum)
 Please contact Misty Ferguson. Skin tears were evaluated. I have placed a referral to wound care for the anterior knee ulcer. She can apply silver  sulfasalazine cream to larger skin tear on right lateral leg and right elbow, apply petroleum gauze, nonstick pad and light wrap.  Instructed the patient to apply Santyl  to the  anterior wound  edges or if unaffordable antibacterial ointment or Silver  sulfasalazine and cover with hydrofera blue dressing, top with nonstick pad and light wrap.  Encouraged patient to elevate her feet above her heart is much as able to help with peripheral swelling.

## 2024-03-27 NOTE — Assessment & Plan Note (Signed)
 Acute, well-healing several skin tears on arm and knee.  Reviewed home wound care.

## 2024-03-27 NOTE — Assessment & Plan Note (Signed)
 Chronic, encouraged her to keep up with p.o. intake and protein supplementation.

## 2024-03-30 DIAGNOSIS — J9621 Acute and chronic respiratory failure with hypoxia: Secondary | ICD-10-CM | POA: Diagnosis not present

## 2024-03-30 DIAGNOSIS — S81011D Laceration without foreign body, right knee, subsequent encounter: Secondary | ICD-10-CM | POA: Diagnosis not present

## 2024-03-30 DIAGNOSIS — J470 Bronchiectasis with acute lower respiratory infection: Secondary | ICD-10-CM | POA: Diagnosis not present

## 2024-03-30 DIAGNOSIS — M800AXD Age-related osteoporosis with current pathological fracture, other site, subsequent encounter for fracture with routine healing: Secondary | ICD-10-CM | POA: Diagnosis not present

## 2024-03-30 DIAGNOSIS — J439 Emphysema, unspecified: Secondary | ICD-10-CM | POA: Diagnosis not present

## 2024-03-30 DIAGNOSIS — S51011D Laceration without foreign body of right elbow, subsequent encounter: Secondary | ICD-10-CM | POA: Diagnosis not present

## 2024-03-30 DIAGNOSIS — J189 Pneumonia, unspecified organism: Secondary | ICD-10-CM | POA: Diagnosis not present

## 2024-03-30 DIAGNOSIS — J441 Chronic obstructive pulmonary disease with (acute) exacerbation: Secondary | ICD-10-CM | POA: Diagnosis not present

## 2024-03-30 DIAGNOSIS — J440 Chronic obstructive pulmonary disease with acute lower respiratory infection: Secondary | ICD-10-CM | POA: Diagnosis not present

## 2024-03-31 ENCOUNTER — Other Ambulatory Visit: Payer: Self-pay

## 2024-03-31 NOTE — Patient Instructions (Signed)
 Visit Information  Thank you for taking time to visit with me today. Please don't hesitate to contact me if I can be of assistance to you before our next scheduled telephone appointment.  Our next appointment is by telephone on 04/08/24 at 1200 pm  Following is a copy of your care plan:   Goals Addressed             This Visit's Progress    [facility] Transitions of Care (TOC) Care Plan       Problems:  Recent Hospitalization for treatment of COPD and pneumonia and small bowel obstruction Knowledge Deficit Related to Pneumonia/COPD and bowel obstruction Home Health Active RN and PT  03/31/24 Patient okay today.  Breathing is good.  PCP visit went well new medications for pain and sleep.  Home health active with RN for assessment and wound care along with PT.   Patient has some pain at times using Tramadol  as needed patient also taking Trazodone  for sleep but does not last long.   Reiterated COPD and bowel management. She verbalized understanding.  RN CM will follow up on 04/08/24.   Goal:  Over the next 30 days, the patient will not experience hospital readmission  Interventions:  Transitions of Care: Doctor Visits  - discussed the importance of doctor visits Reviewed Signs and symptoms of infection Oxygen  use- keeping oxygen  levels above 80. Encouraged patient to pace self with activity. Reviewed importance of medication compliance. Reviewed when to call for physician for increased shortness of breath, increased sputum, fatigue that is not normal, cough, fever or sick contacts.   COPD Interventions: Provided education about and advised patient to utilize infection prevention strategies to reduce risk of respiratory infection Provided instruction about proper use of medications used for management of COPD including inhalers Provided written and verbal instructions on pursed lip breathing and utilized returned demonstration as teach back Use of home oxygen   Patient Self Care  Activities:  Attend all scheduled provider appointments Call provider office for new concerns or questions  Notify RN Care Manager of TOC call rescheduling needs Participate in Transition of Care Program/Attend TOC scheduled calls Take medications as prescribed   do breathing exercises every day keep follow-up appointments:  Pulmonologist 8/26  do breathing exercises at least 2 times each day Uses smart vest as ordered BIDx  Plan:  The patient has been provided with contact information for the care management team and has been advised to call with any health related questions or concerns.         Patient verbalizes understanding of instructions and care plan provided today and agrees to view in MyChart. Active MyChart status and patient understanding of how to access instructions and care plan via MyChart confirmed with patient.     The patient has been provided with contact information for the care management team and has been advised to call with any health related questions or concerns.   Please call the care guide team at [phone] if you need to cancel or reschedule your appointment.   Please call the Suicide and Crisis Lifeline: 988 if you are experiencing a Mental Health or Behavioral Health Crisis or need someone to talk to.  Donielle Radziewicz J. Millenia Waldvogel RN, MSN [facility], [facility] RN Care Manager Direct Dial: [phone]  Fax: [phone] Website: [email or web address]

## 2024-03-31 NOTE — Transitions of Care (Post Inpatient/ED Visit) (Addendum)
 Transition of Care week 3  Visit Note  03/31/2024  Name: Misty Ferguson MRN: [id number]          DOB: [birth year]  Situation: Patient enrolled in [facility] 30-day program. Visit completed with patient by telephone.   Background:   Initial Transition Care Management Follow-up Telephone Call    Past Medical History:  Diagnosis Date   AK (actinic keratosis) 12/08/2020   right pretibia inferior bx proven, LN2 01/10/21   Burping    Chronic airway obstruction, not elsewhere classified    Dyspnea    Dysrhythmia    Macular degeneration (senile) of retina, unspecified    Malignant neoplasm of urinary bladder (HCC) 03/2019   Partial bladder resection and Rad tx's.    Obstructive chronic bronchitis with exacerbation (HCC)    Osteoporosis, unspecified    Oxygen  deficiency    Personal history of peptic ulcer disease    SCC (squamous cell carcinoma) 12/08/2020   right pretibia superior, EDC 01/10/21   SCC (squamous cell carcinoma) 07/03/2022   left medial calf, clear 09/25/22   SCC (squamous cell carcinoma) 11/22/2022   right thigh   treated with ED&C   Squamous cell carcinoma in situ (SCCIS) 07/03/2022   right pretibia sccis needs treatment with fluorouracil    Tobacco use disorder    Unspecified essential hypertension    Unspecified glaucoma(365.9)     Assessment: Patient Reported Symptoms: Cognitive Cognitive Status: Alert and oriented to person, place, and time, Normal speech and language skills      Neurological Neurological Review of Symptoms: No symptoms reported    HEENT HEENT Symptoms Reported: No symptoms reported      Cardiovascular Cardiovascular Symptoms Reported: No symptoms reported    Respiratory Respiratory Symptoms Reported: Productive cough, Shortness of breath Additional Respiratory Details: Oxygen  @ 2 liters continuous, Shortness of breath with exertion Respiratory Management Strategies: Breathing techniques, Breathing exercise, Oxygen  therapy, Medication  therapy, Routine screening Respiratory Self-Management Outcome: 4 (good)  Endocrine Endocrine Symptoms Reported: No symptoms reported    Gastrointestinal Gastrointestinal Symptoms Reported: No symptoms reported Additional Gastrointestinal Details: Uses miralax  as needed Gastrointestinal Management Strategies: Medication therapy, Diet modification Gastrointestinal Self-Management Outcome: 4 (good)    Genitourinary Genitourinary Symptoms Reported: No symptoms reported    Integumentary Integumentary Symptoms Reported: Wound Other Integumentary Symptoms: Wound to right elbow healing.  Right and left knee areas being dressed daily. Home health nurse evaluating 2 week.  Wound clinic referral- Appointment about 3 weeks away.  Reviewed signs of infection. Skin Management Strategies: Dressing changes (Spouse completing dressing changes when home health not visiting) Skin Self-Management Outcome: 3 (uncertain)  Musculoskeletal Musculoskelatal Symptoms Reviewed: Weakness Additional Musculoskeletal Details: Home health PT acitve. Patient states she is doing exercises taught by therapist Musculoskeletal Management Strategies: Exercise Musculoskeletal Self-Management Outcome: 4 (good)      Psychosocial Psychosocial Symptoms Reported: No symptoms reported         There were no vitals filed for this visit.  Medications Reviewed Today     Reviewed by Osmond Steckman, RN (Case Manager) on 03/31/24 at 1208  Med List Status: <None>   Medication Order Taking? Sig Documenting Provider Last Dose Status Informant  Acetylcysteine (NAC 600 PO) [id number] Yes Take by mouth. [provider]  Active Self, Spouse/Significant Other, Pharmacy Records  albuterol  (PROVENTIL ) (2.5 MG/3ML) 0.083% nebulizer solution [id number] Yes INHALE THE CONTENTS OF 1 VIAL VIA NEBULIZER EVERY 4 HOURS AS NEEDED FOR WHEEZING OR SHORTNESS OF BREATH. Kasa, Kurian, MD  Active Self, Spouse/Significant Other, Pharmacy Records  albuterol  (VENTOLIN  HFA) 108 (90 Base) MCG/ACT inhaler [id number] Yes Inhale 2 puffs into the lungs every 6 (six) hours as needed for wheezing or shortness of breath. Kasa, Kurian, MD  Active Self, Spouse/Significant Other, Pharmacy Records  ascorbic acid (VITAMIN C) 1000 MG tablet [id number] Yes Take 1,000 mg by mouth daily.  [provider]  Active Self, Spouse/Significant Other, Pharmacy Records  aspirin  EC 81 MG tablet [id number] Yes Take 1 tablet (81 mg total) by mouth daily. Swallow whole. Marea Selinda RAMAN, MD  Active Self, Spouse/Significant Other, Pharmacy Records  atorvastatin  (LIPITOR) 10 MG tablet [id number] Yes TAKE 1 TABLET EVERY DAY Dew, Jason S, MD  Active Self, Spouse/Significant Other, Pharmacy Records  Azelastine  HCl 137 MCG/SPRAY SOLN [id number] Yes Place 2 sprays into both nostrils 2 (two) times daily. Kasa, Kurian, MD  Active Self, Spouse/Significant Other, Pharmacy Records  budesonide -glycopyrrolate -formoterol  (BREZTRI  AEROSPHERE) 160-9-4.8 MCG/ACT AERO inhaler [id number] Yes Inhale 2 puffs into the lungs in the morning and at bedtime. Kasa, Kurian, MD  Active Self, Spouse/Significant Other, Pharmacy Records  budesonide -glycopyrrolate -formoterol  (BREZTRI  AEROSPHERE) 160-9-4.8 MCG/ACT AERO inhaler [id number] Yes Inhale 2 puffs into the lungs 2 (two) times daily. Avelina Greig BRAVO, MD  Active Self, Spouse/Significant Other, Pharmacy Records  Cholecalciferol  (VITAMIN D ) 125 MCG (5000 UT) CAPS [id number] Yes Take 5,000 Units by mouth daily. [provider]  Active Self, Spouse/Significant Other, Pharmacy Records  clopidogrel  (PLAVIX ) 75 MG tablet [id number] Yes TAKE 1 TABLET EVERY DAY Dew, Jason S, MD  Active Self, Spouse/Significant Other, Pharmacy Records  Coenzyme Q10 (COQ10) 200 MG CAPS [id number] Yes Take by mouth. [provider]  Active Self, Spouse/Significant Other, Pharmacy Records  collagenase  (SANTYL ) 250 UNIT/GM ointment [id number] Yes Apply 1 Application topically  daily. [name], Amy E, MD  Active   guaiFENesin  (MUCINEX ) 600 MG 12 hr tablet [id number] Yes Take 600 mg by mouth 2 (two) times daily. [provider]  Active Spouse/Significant Other, Self, Pharmacy Records           Med Note RAYNE ARLEY HERO   Sat Mar 14, 2024  3:49 PM) PRN  melatonin 5 MG TABS [id number]  Take 1 tablet (5 mg total) by mouth at bedtime. Laurita Pillion, MD  Active   Multiple Vitamins-Minerals (PRESERVISION AREDS) TABS [id number]  Take 1 tablet by mouth 2 (two) times daily. [provider]  Active Self, Spouse/Significant Other, Pharmacy Records  OXYGEN  [id number] Yes Inhale 4 L into the lungs daily in the afternoon. [provider]  Active Self, Spouse/Significant Other, Pharmacy Records  pantoprazole  (PROTONIX ) 40 MG tablet [id number] Yes Take 1 tablet (40 mg total) by mouth daily. Laurita Pillion, MD  Active   [facility] Sprint Nebulizer Set MISC [id number] Yes  [provider]  Active Self, Spouse/Significant Other, Pharmacy Records  polyethylene glycol (MIRALAX  / GLYCOLAX ) 17 g packet [id number] Yes Take 17 g by mouth daily. Laurita Pillion, MD  Active   senna-docusate (SENOKOT-S) 8.6-50 MG tablet [id number] Yes Take 1 tablet by mouth 2 (two) times daily as needed for mild constipation. Laurita Pillion, MD  Active   traMADol  (ULTRAM ) 50 MG tablet [id number] Yes Take 1 tablet (50 mg total) by mouth every 8 (eight) hours as needed for up to 5 days. Avelina Greig BRAVO, MD  Active   traZODone  (DESYREL ) 50 MG tablet [id number] Yes Take 0.5-1 tablets (25-50 mg total) by mouth at bedtime as needed for sleep. [name], Amy E, MD  Active   verapamil  (VERELAN  PM) 360 MG 24 hr capsule [id number] Yes  Take 360 mg by mouth daily. [provider]  Active Self, Spouse/Significant Other, Pharmacy Records  Vitamin E 180 MG CAPS [id number] Yes Take 180 mg by mouth daily. [provider]  Active Self, Spouse/Significant Other, Pharmacy Records  Wound Dressings [facility]  4X4) PADS [id number] Yes Apply to knee ulcer. Avelina Greig BRAVO, MD  Active             Goals      Patient Stated     No new goals just doing the best I can and take it day by day     Pharmacy Care Plan     CARE PLAN ENTRY  Current Barriers:  Chronic Disease Management support, education, and care coordination needs related to Hypertension and COPD   Hypertension BP Readings from Last 3 Encounters:  04/20/20 (!) 148/79  10/30/19 132/60  10/28/19 (!) 144/81   Pharmacist Clinical Goal(s): Over the next 6 months, patient will work with PharmD and providers to achieve BP goal <140/90 mmHg Current regimen:  Losartan  25 mg - 1 tablet daily Verapamil  360 mg - 1 tablet daily Interventions: Reviewed recent BP readings  Reviewed labs: kidney function, potassium levels  Recommend continuing current therapy Patient self care activities - Over the next 6 months, patient will: Check BP once monthly and 5 days prior to any office visits, document, and provide at future appointments Ensure daily salt intake < 2300 mg/day  COPD Pharmacist Clinical Goal(s) Over the next 6 months, patient will work with PharmD and providers to improve shortness of breath Current regimen:  Stiolto 2.5-2.5 mcg - Inhale 2 puffs daily  Interventions: Contact insurance to review cost of Stiolto alternatives - Per insurance, Stiolto is the preferred and lowest cost option in the drug class, Anoro is not covered. Discussed patient assistance options - patient declined application 12/2019 Patient self care activities - Over the next 6 months, patient will: Discuss inhaled steroid therapy with pulmonologist   Please see past updates related to this goal by clicking on the Past Updates button in the selected goal       [facility] Transitions of Care (TOC) Care Plan     Problems:  Recent Hospitalization for treatment of COPD and pneumonia and small bowel obstruction Knowledge Deficit Related to Pneumonia/COPD and  bowel obstruction Home Health Active RN and PT  03/31/24 Patient okay today.  Breathing is good.  PCP visit went well new medications for pain and sleep.  Home health active with RN for assessment and wound care along with PT.   Patient has some pain at times using Tramadol  as needed patient also taking Trazodone  for sleep but does not last long.   Reiterated COPD and bowel management. She verbalized understanding.  RN CM will follow up on 04/08/24.   Goal:  Over the next 30 days, the patient will not experience hospital readmission  Interventions:  Transitions of Care: Doctor Visits  - discussed the importance of doctor visits Reviewed Signs and symptoms of infection Oxygen  use- keeping oxygen  levels above 80. Encouraged patient to pace self with activity. Reviewed importance of medication compliance. Reviewed when to call for physician for increased shortness of breath, increased sputum, fatigue that is not normal, cough, fever or sick contacts.   COPD Interventions: Provided education about and advised patient to utilize infection prevention strategies to reduce risk of respiratory infection Provided instruction about proper use of medications used for management of COPD including inhalers Provided written and verbal instructions on pursed lip breathing  and utilized returned demonstration as teach back Use of home oxygen   Patient Self Care Activities:  Attend all scheduled provider appointments Call provider office for new concerns or questions  Notify RN Care Manager of TOC call rescheduling needs Participate in Transition of Care Program/Attend TOC scheduled calls Take medications as prescribed   do breathing exercises every day keep follow-up appointments:  Pulmonologist 8/26  do breathing exercises at least 2 times each day Uses smart vest as ordered BIDx  Plan:  The patient has been provided with contact information for the care management team and has been advised to  call with any health related questions or concerns.         Recommendation:   Continue Current Plan of Care  Follow Up Plan:   Telephone follow-up in 1 week  Cadarius Nevares J. Carlen Rebuck RN, MSN [facility], [facility] RN Care Manager Direct Dial: [phone]  Fax: [phone] Website: [email or web address]

## 2024-04-02 DIAGNOSIS — S81011D Laceration without foreign body, right knee, subsequent encounter: Secondary | ICD-10-CM | POA: Diagnosis not present

## 2024-04-02 DIAGNOSIS — J439 Emphysema, unspecified: Secondary | ICD-10-CM | POA: Diagnosis not present

## 2024-04-02 DIAGNOSIS — I700 Atherosclerosis of aorta: Secondary | ICD-10-CM

## 2024-04-02 DIAGNOSIS — J470 Bronchiectasis with acute lower respiratory infection: Secondary | ICD-10-CM | POA: Diagnosis not present

## 2024-04-02 DIAGNOSIS — J440 Chronic obstructive pulmonary disease with acute lower respiratory infection: Secondary | ICD-10-CM | POA: Diagnosis not present

## 2024-04-02 DIAGNOSIS — S51011D Laceration without foreign body of right elbow, subsequent encounter: Secondary | ICD-10-CM | POA: Diagnosis not present

## 2024-04-02 DIAGNOSIS — J441 Chronic obstructive pulmonary disease with (acute) exacerbation: Secondary | ICD-10-CM | POA: Diagnosis not present

## 2024-04-02 DIAGNOSIS — J9621 Acute and chronic respiratory failure with hypoxia: Secondary | ICD-10-CM | POA: Diagnosis not present

## 2024-04-02 DIAGNOSIS — M800AXD Age-related osteoporosis with current pathological fracture, other site, subsequent encounter for fracture with routine healing: Secondary | ICD-10-CM | POA: Diagnosis not present

## 2024-04-02 DIAGNOSIS — J189 Pneumonia, unspecified organism: Secondary | ICD-10-CM | POA: Diagnosis not present

## 2024-04-02 DIAGNOSIS — Z48812 Encounter for surgical aftercare following surgery on the circulatory system: Secondary | ICD-10-CM

## 2024-04-02 DIAGNOSIS — I70222 Atherosclerosis of native arteries of extremities with rest pain, left leg: Secondary | ICD-10-CM

## 2024-04-03 DIAGNOSIS — J189 Pneumonia, unspecified organism: Secondary | ICD-10-CM | POA: Diagnosis not present

## 2024-04-03 DIAGNOSIS — J441 Chronic obstructive pulmonary disease with (acute) exacerbation: Secondary | ICD-10-CM | POA: Diagnosis not present

## 2024-04-03 DIAGNOSIS — M800AXD Age-related osteoporosis with current pathological fracture, other site, subsequent encounter for fracture with routine healing: Secondary | ICD-10-CM | POA: Diagnosis not present

## 2024-04-03 DIAGNOSIS — J470 Bronchiectasis with acute lower respiratory infection: Secondary | ICD-10-CM | POA: Diagnosis not present

## 2024-04-03 DIAGNOSIS — J9621 Acute and chronic respiratory failure with hypoxia: Secondary | ICD-10-CM | POA: Diagnosis not present

## 2024-04-03 DIAGNOSIS — S81011D Laceration without foreign body, right knee, subsequent encounter: Secondary | ICD-10-CM | POA: Diagnosis not present

## 2024-04-03 DIAGNOSIS — S51011D Laceration without foreign body of right elbow, subsequent encounter: Secondary | ICD-10-CM | POA: Diagnosis not present

## 2024-04-03 DIAGNOSIS — J440 Chronic obstructive pulmonary disease with acute lower respiratory infection: Secondary | ICD-10-CM | POA: Diagnosis not present

## 2024-04-03 DIAGNOSIS — J439 Emphysema, unspecified: Secondary | ICD-10-CM | POA: Diagnosis not present

## 2024-04-06 DIAGNOSIS — M800AXD Age-related osteoporosis with current pathological fracture, other site, subsequent encounter for fracture with routine healing: Secondary | ICD-10-CM | POA: Diagnosis not present

## 2024-04-06 DIAGNOSIS — S81011D Laceration without foreign body, right knee, subsequent encounter: Secondary | ICD-10-CM | POA: Diagnosis not present

## 2024-04-06 DIAGNOSIS — J441 Chronic obstructive pulmonary disease with (acute) exacerbation: Secondary | ICD-10-CM | POA: Diagnosis not present

## 2024-04-06 DIAGNOSIS — J9621 Acute and chronic respiratory failure with hypoxia: Secondary | ICD-10-CM | POA: Diagnosis not present

## 2024-04-06 DIAGNOSIS — J470 Bronchiectasis with acute lower respiratory infection: Secondary | ICD-10-CM | POA: Diagnosis not present

## 2024-04-06 DIAGNOSIS — J189 Pneumonia, unspecified organism: Secondary | ICD-10-CM | POA: Diagnosis not present

## 2024-04-06 DIAGNOSIS — J440 Chronic obstructive pulmonary disease with acute lower respiratory infection: Secondary | ICD-10-CM | POA: Diagnosis not present

## 2024-04-06 DIAGNOSIS — J439 Emphysema, unspecified: Secondary | ICD-10-CM | POA: Diagnosis not present

## 2024-04-06 DIAGNOSIS — S51011D Laceration without foreign body of right elbow, subsequent encounter: Secondary | ICD-10-CM | POA: Diagnosis not present

## 2024-04-07 ENCOUNTER — Ambulatory Visit (INDEPENDENT_AMBULATORY_CARE_PROVIDER_SITE_OTHER): Admitting: Vascular Surgery

## 2024-04-07 ENCOUNTER — Encounter (INDEPENDENT_AMBULATORY_CARE_PROVIDER_SITE_OTHER): Payer: Self-pay | Admitting: Vascular Surgery

## 2024-04-07 ENCOUNTER — Encounter: Payer: Self-pay | Admitting: Internal Medicine

## 2024-04-07 ENCOUNTER — Ambulatory Visit: Admitting: Internal Medicine

## 2024-04-07 VITALS — BP 126/63 | HR 82

## 2024-04-07 VITALS — BP 120/82 | HR 89 | Temp 98.7°F | Ht 59.0 in | Wt 93.0 lb

## 2024-04-07 DIAGNOSIS — J479 Bronchiectasis, uncomplicated: Secondary | ICD-10-CM

## 2024-04-07 DIAGNOSIS — J441 Chronic obstructive pulmonary disease with (acute) exacerbation: Secondary | ICD-10-CM

## 2024-04-07 DIAGNOSIS — J90 Pleural effusion, not elsewhere classified: Secondary | ICD-10-CM | POA: Diagnosis not present

## 2024-04-07 DIAGNOSIS — R9389 Abnormal findings on diagnostic imaging of other specified body structures: Secondary | ICD-10-CM | POA: Diagnosis not present

## 2024-04-07 DIAGNOSIS — I998 Other disorder of circulatory system: Secondary | ICD-10-CM

## 2024-04-07 DIAGNOSIS — I10 Essential (primary) hypertension: Secondary | ICD-10-CM

## 2024-04-07 DIAGNOSIS — M7989 Other specified soft tissue disorders: Secondary | ICD-10-CM

## 2024-04-07 DIAGNOSIS — E7800 Pure hypercholesterolemia, unspecified: Secondary | ICD-10-CM | POA: Diagnosis not present

## 2024-04-07 DIAGNOSIS — J449 Chronic obstructive pulmonary disease, unspecified: Secondary | ICD-10-CM

## 2024-04-07 DIAGNOSIS — J9611 Chronic respiratory failure with hypoxia: Secondary | ICD-10-CM

## 2024-04-07 DIAGNOSIS — I70222 Atherosclerosis of native arteries of extremities with rest pain, left leg: Secondary | ICD-10-CM | POA: Diagnosis not present

## 2024-04-07 DIAGNOSIS — J411 Mucopurulent chronic bronchitis: Secondary | ICD-10-CM

## 2024-04-07 DIAGNOSIS — R0689 Other abnormalities of breathing: Secondary | ICD-10-CM

## 2024-04-07 MED ORDER — FUROSEMIDE 20 MG PO TABS: 20.0000 mg | ORAL_TABLET | Freq: Every day | ORAL | 0 refills | Status: DC

## 2024-04-07 MED ORDER — LEVOFLOXACIN 750 MG PO TABS: 750.0000 mg | ORAL_TABLET | Freq: Every day | ORAL | 7 refills | Status: DC

## 2024-04-07 MED ORDER — PREDNISONE 10 MG PO TABS: 40.0000 mg | ORAL_TABLET | Freq: Every day | ORAL | 1 refills | Status: DC

## 2024-04-07 NOTE — Patient Instructions (Signed)
 Start Levaquin  750 mg 1 pill once a day for 7 days Start prednisone  80 mg daily for 10 days  Continue inhalers as prescribed with Breztri  Continue oxygen  as prescribed  Continue vest therapy and flutter valve device  We will plan to consider Daliresp as an oral medication in next office visit  Avoid Allergens and Irritants Avoid secondhand smoke Avoid SICK contacts Recommend  Masking  when appropriate Recommend Keep up-to-date with vaccinations

## 2024-04-07 NOTE — Assessment & Plan Note (Signed)
 blood pressure control important in reducing the progression of atherosclerotic disease. On appropriate oral medications.

## 2024-04-07 NOTE — Assessment & Plan Note (Signed)
 Quite prominent.  Short supply of Lasix .  Elevation and compression as well as increasing her activity as tolerated.  If she develops skin breakdown or weepage, Unna boots will be placed.

## 2024-04-07 NOTE — Progress Notes (Signed)
 MRN : [id number]  Misty Ferguson is a 89 y.o. (05/24/1934) female who presents with chief complaint of  Chief Complaint  Patient presents with   Follow-up  .  History of Present Illness: Patient returns today in follow up of her ischemic left lower extremity.  About 2 weeks ago, she underwent extensive left lower extremity revascularization.  She was admitted to the hospital with a litany of issues and stayed for several extra days dealing with all of her medical issues.  She did have significant reperfusion swelling but she actually has marked swelling in both lower extremities.  Her pulmonary status is tenuous and she actually saw her pulmonologist today.  She is on chronic oxygen .  Her leg swelling is prominent but she does not have open wounds or ulceration currently.  She and her husband just darted using compression socks and they been trying to elevate her legs.  Her access site is well-healed.  She has tolerated her antiplatelet therapy without obvious bleeding.  Current Outpatient Medications  Medication Sig Dispense Refill   Acetylcysteine (NAC 600 PO) Take by mouth.     albuterol  (PROVENTIL ) (2.5 MG/3ML) 0.083% nebulizer solution INHALE THE CONTENTS OF 1 VIAL VIA NEBULIZER EVERY 4 HOURS AS NEEDED FOR WHEEZING OR SHORTNESS OF BREATH. 450 mL 10   albuterol  (VENTOLIN  HFA) 108 (90 Base) MCG/ACT inhaler Inhale 2 puffs into the lungs every 6 (six) hours as needed for wheezing or shortness of breath. 8 g 6   ascorbic acid (VITAMIN C) 1000 MG tablet Take 1,000 mg by mouth daily.      aspirin  EC 81 MG tablet Take 1 tablet (81 mg total) by mouth daily. Swallow whole. 150 tablet 2   atorvastatin  (LIPITOR) 10 MG tablet TAKE 1 TABLET EVERY DAY 90 tablet 3   Azelastine  HCl 137 MCG/SPRAY SOLN Place 2 sprays into both nostrils 2 (two) times daily. 100 mL 5   budesonide -glycopyrrolate -formoterol  (BREZTRI  AEROSPHERE) 160-9-4.8 MCG/ACT AERO inhaler Inhale 2 puffs into the lungs in the morning and at  bedtime.     budesonide -glycopyrrolate -formoterol  (BREZTRI  AEROSPHERE) 160-9-4.8 MCG/ACT AERO inhaler Inhale 2 puffs into the lungs 2 (two) times daily. 3 each 3   Cholecalciferol  (VITAMIN D ) 125 MCG (5000 UT) CAPS Take 5,000 Units by mouth daily.     clopidogrel  (PLAVIX ) 75 MG tablet TAKE 1 TABLET EVERY DAY 90 tablet 3   Coenzyme Q10 (COQ10) 200 MG CAPS Take by mouth.     collagenase  (SANTYL ) 250 UNIT/GM ointment Apply 1 Application topically daily. 15 g 0   furosemide  (LASIX ) 20 MG tablet Take 1 tablet (20 mg total) by mouth daily for 7 days. 7 tablet 0   guaiFENesin  (MUCINEX ) 600 MG 12 hr tablet Take 600 mg by mouth 2 (two) times daily.     levofloxacin  (LEVAQUIN ) 750 MG tablet Take 1 tablet (750 mg total) by mouth daily. 7 days 1 tablet 7   melatonin 5 MG TABS Take 1 tablet (5 mg total) by mouth at bedtime. 30 tablet 0   Multiple Vitamins-Minerals (PRESERVISION AREDS) TABS Take 1 tablet by mouth 2 (two) times daily.     OXYGEN  Inhale 4 L into the lungs daily in the afternoon.     Pari LC Sprint Nebulizer Set MISC      polyethylene glycol (MIRALAX  / GLYCOLAX ) 17 g packet Take 17 g by mouth daily. 14 each 0   predniSONE  (DELTASONE ) 10 MG tablet Take 4 tablets (40 mg total) by mouth daily with breakfast. 10  days 10 tablet 1   senna-docusate (SENOKOT-S) 8.6-50 MG tablet Take 1 tablet by mouth 2 (two) times daily as needed for mild constipation. 60 tablet 0   traMADol  (ULTRAM ) 50 MG tablet Take 50 mg by mouth every 6 (six) hours as needed for moderate pain (pain score 4-6) or severe pain (pain score 7-10).     traZODone  (DESYREL ) 50 MG tablet Take 0.5-1 tablets (25-50 mg total) by mouth at bedtime as needed for sleep. 30 tablet 3   verapamil  (VERELAN  PM) 360 MG 24 hr capsule Take 360 mg by mouth daily.     Vitamin E 180 MG CAPS Take 180 mg by mouth daily.     Wound Dressings (HYDROFERA BLUE 4X4) PADS Apply to knee ulcer. 5 each 0   pantoprazole  (PROTONIX ) 40 MG tablet Take 1 tablet (40 mg  total) by mouth daily. (Patient not taking: Reported on 04/07/2024) 14 tablet 0   No current facility-administered medications for this visit.    Past Medical History:  Diagnosis Date   AK (actinic keratosis) 12/08/2020   right pretibia inferior bx proven, LN2 01/10/21   Burping    Chronic airway obstruction, not elsewhere classified    Dyspnea    Dysrhythmia    Macular degeneration (senile) of retina, unspecified    Malignant neoplasm of urinary bladder (HCC) 03/2019   Partial bladder resection and Rad tx's.    Obstructive chronic bronchitis with exacerbation (HCC)    Osteoporosis, unspecified    Oxygen  deficiency    Personal history of peptic ulcer disease    SCC (squamous cell carcinoma) 12/08/2020   right pretibia superior, EDC 01/10/21   SCC (squamous cell carcinoma) 07/03/2022   left medial calf, clear 09/25/22   SCC (squamous cell carcinoma) 11/22/2022   right thigh   treated with ED&C   Squamous cell carcinoma in situ (SCCIS) 07/03/2022   right pretibia sccis needs treatment with fluorouracil    Tobacco use disorder    Unspecified essential hypertension    Unspecified glaucoma(365.9)     Past Surgical History:  Procedure Laterality Date   CATARACT EXTRACTION W/ INTRAOCULAR LENS  IMPLANT, BILATERAL     CYSTOSCOPY WITH STENT PLACEMENT Left 05/22/2019   Procedure: CYSTOSCOPY WITH STENT PLACEMENT;  Surgeon: Francisca Redell BROCKS, MD;  Location: [facility];  Service: Urology;  Laterality: Left;   ELBOW FRACTURE SURGERY Left    LOWER EXTREMITY ANGIOGRAPHY Right 11/20/2021   Procedure: Lower Extremity Angiography;  Surgeon: Marea Selinda RAMAN, MD;  Location: [facility];  Service: Cardiovascular;  Laterality: Right;   LOWER EXTREMITY ANGIOGRAPHY Left 09/03/2022   Procedure: Lower Extremity Angiography;  Surgeon: Marea Selinda RAMAN, MD;  Location: [facility];  Service: Cardiovascular;  Laterality: Left;   LOWER EXTREMITY ANGIOGRAPHY Left 06/03/2023   Procedure: Lower Extremity  Angiography;  Surgeon: Marea Selinda RAMAN, MD;  Location: [facility];  Service: Cardiovascular;  Laterality: Left;   LOWER EXTREMITY ANGIOGRAPHY Left 03/16/2024   Procedure: Lower Extremity Angiography;  Surgeon: Marea Selinda RAMAN, MD;  Location: [facility];  Service: Cardiovascular;  Laterality: Left;   TRANSURETHRAL RESECTION OF BLADDER TUMOR N/A 05/22/2019   Procedure: TRANSURETHRAL RESECTION OF BLADDER TUMOR (TURBT);  Surgeon: Francisca Redell BROCKS, MD;  Location: [facility];  Service: Urology;  Laterality: N/A;     Social History   Tobacco Use   Smoking status: Former    Current packs/day: 0.00    Average packs/day: 1.3 packs/day for 45.0 years (58.5 ttl pk-yrs)  Types: Cigarettes    Start date: 16    Quit date: 1998    Years since quitting: 27.6    Passive exposure: Past   Smokeless tobacco: Never  Vaping Use   Vaping status: Never Used  Substance Use Topics   Alcohol use: Yes    Alcohol/week: 14.0 standard drinks of alcohol    Types: 14 Glasses of wine per week    Comment: 1x/day   Drug use: No      Family History  Adopted: Yes     No Known Allergies    REVIEW OF SYSTEMS (Negative unless checked)   Constitutional: [] Weight loss  [] Fever  [] Chills Cardiac: [] Chest pain   [] Chest pressure   [] Palpitations   [] Shortness of breath when laying flat   [x] Shortness of breath at rest   [x] Shortness of breath with exertion. Vascular:  [x] Pain in legs with walking   [] Pain in legs at rest   [] Pain in legs when laying flat   [x] Claudication   [] Pain in feet when walking  [x] Pain in feet at rest  [x] Pain in feet when laying flat   [] History of DVT   [] Phlebitis   [] Swelling in legs   [] Varicose veins   [] Non-healing ulcers Pulmonary:   [] Uses home oxygen    [] Productive cough   [] Hemoptysis   [] Wheeze  [x] COPD   [] Asthma Neurologic:  [] Dizziness  [] Blackouts   [] Seizures   [] History of stroke   [] History of TIA  [] Aphasia   [] Temporary blindness   [] Dysphagia   [] Weakness  or numbness in arms   [] Weakness or numbness in legs Musculoskeletal:  [] Arthritis   [] Joint swelling   [] Joint pain   [] Low back pain Hematologic:  [x] Easy bruising  [] Easy bleeding   [] Hypercoagulable state   [] Anemic  [] Hepatitis Gastrointestinal:  [] Blood in stool   [] Vomiting blood  [] Gastroesophageal reflux/heartburn   [] Difficulty swallowing. Genitourinary:  [x] Chronic kidney disease   [] Difficult urination  [] Frequent urination  [] Burning with urination   [] Blood in urine Skin:  [] Rashes   [] Ulcers   [] Wounds Psychological:  [] History of anxiety   []  History of major depression.  Physical Examination  BP 126/63   Pulse 82  Gen:  WD/WN, NAD, thin and frail Head: [place]/AT, + temporalis wasting. Ear/Nose/Throat: Hearing grossly intact, nares w/o erythema or drainage Eyes: Conjunctiva clear. Sclera non-icteric Neck: Supple.  Trachea midline Pulmonary:  Good air movement, no use of accessory muscles.  Cardiac: irregular Vascular:  Vessel Right Left  Radial Palpable Palpable                          PT Not palpable Not palpable  DP 1+ palpable 1+ palpable    Musculoskeletal: M/S 5/5 throughout.  No deformity or atrophy.  1-2+ right lower extremity edema, 2+ left lower extremity edema.  The skin does have some blistering and some stasis dermatitis changes which are fairly prominent but no current open wounds Neurologic: Sensation grossly intact in extremities.  Symmetrical.  Speech is fluent.  Psychiatric: Judgment intact, Mood & affect appropriate for pt's clinical situation.       Labs Recent Results (from the past 2160 hours)  Blood gas, venous     Status: Abnormal   Collection Time: 03/14/24  7:08 AM  Result Value Ref Range   pH, Ven 7.29 7.25 - 7.43   pCO2, Ven 52 44 - 60 mmHg   pO2, Ven 51 (H) 32 - 45 mmHg   Bicarbonate  25.0 20.0 - 28.0 mmol/L   Acid-base deficit 2.3 (H) 0.0 - 2.0 mmol/L   O2 Saturation 77.6 %   Patient temperature 37.0    Collection site VEIN      Comment: Performed at [facility], [address] [place], [place] [address]  Brain natriuretic peptide     Status: None   Collection Time: 03/14/24  7:08 AM  Result Value Ref Range   B Natriuretic Peptide 92.6 0.0 - 100.0 pg/mL    Comment: Performed at [facility] [place] [facility], [address] [place], [place] [address]  CBC     Status: Abnormal   Collection Time: 03/14/24  7:09 AM  Result Value Ref Range   WBC 11.7 (H) 4.0 - 10.5 K/uL   RBC 4.18 3.87 - 5.11 MIL/uL   Hemoglobin 12.9 12.0 - 15.0 g/dL   HCT 59.3 63.9 - 53.9 %   MCV 97.1 80.0 - 100.0 fL   MCH 30.9 26.0 - 34.0 pg   MCHC 31.8 30.0 - 36.0 g/dL   RDW 86.2 88.4 - 84.4 %   Platelets 390 150 - 400 K/uL   nRBC 0.0 0.0 - 0.2 %    Comment: Performed at [facility], [address] [place], [place] [address]  Comprehensive metabolic panel     Status: Abnormal   Collection Time: 03/14/24  7:09 AM  Result Value Ref Range   Sodium 140 135 - 145 mmol/L   Potassium 4.7 3.5 - 5.1 mmol/L   Chloride 104 98 - 111 mmol/L   CO2 25 22 - 32 mmol/L   Glucose, Bld 176 (H) 70 - 99 mg/dL    Comment: Glucose reference range applies only to samples taken after fasting for at least 8 hours.   BUN 21 8 - 23 mg/dL   Creatinine, Ser 9.35 0.44 - 1.00 mg/dL   Calcium  9.2 8.9 - 10.3 mg/dL   Total Protein 5.9 (L) 6.5 - 8.1 g/dL   Albumin 3.5 3.5 - 5.0 g/dL   AST 20 15 - 41 U/L   ALT 19 0 - 44 U/L   Alkaline Phosphatase 89 38 - 126 U/L   Total Bilirubin 0.4 0.0 - 1.2 mg/dL   GFR, Estimated >39 >39 mL/min    Comment: (NOTE) Calculated using the CKD-EPI Creatinine Equation (2021)    Anion gap 11 5 - 15    Comment: Performed at [facility], [address], [place], [place] [address]  Troponin I (High Sensitivity)     Status: None   Collection Time: 03/14/24  7:09 AM  Result Value Ref Range   Troponin I (High Sensitivity) 7 <18 ng/L    Comment: (NOTE) Elevated high sensitivity troponin I (hsTnI) values and  significant  changes across serial measurements may suggest ACS but many other  chronic and acute conditions are known to elevate hsTnI results.  Refer to the Links section for chest pain algorithms and additional  guidance. Performed at [facility], [address] [place], [place] [address]   APTT     Status: None   Collection Time: 03/14/24  7:09 AM  Result Value Ref Range   aPTT 30 24 - 36 seconds    Comment: Performed at [facility], [address] [place], [place] [address]  Protime-INR     Status: None   Collection Time: 03/14/24  7:09 AM  Result Value Ref Range   Prothrombin Time 13.5 11.4 - 15.2 seconds   INR 1.0 0.8 -  1.2    Comment: (NOTE) INR goal varies based on device and disease states. Performed at [facility], [address] [place], [place] [address]   Lactic acid, plasma     Status: Abnormal   Collection Time: 03/14/24 10:10 AM  Result Value Ref Range   Lactic Acid, Venous 2.0 (HH) 0.5 - 1.9 mmol/L    Comment: CRITICAL RESULT CALLED TO, READ BACK BY AND VERIFIED WITH JESS SAVAGE AT 1048 03/14/24.PMF Performed at [facility], [address] [place], [place] [address]   Blood culture (routine x 2)     Status: None   Collection Time: 03/14/24 10:10 AM   Specimen: BLOOD LEFT ARM  Result Value Ref Range   Specimen Description BLOOD LEFT ARM    Special Requests      BOTTLES DRAWN AEROBIC AND ANAEROBIC Blood Culture results may not be optimal due to an inadequate volume of blood received in culture bottles   Culture      NO GROWTH 5 DAYS Performed at [facility], [address] [place], [place] [address]    Report Status 03/19/2024 FINAL   Blood culture (routine x 2)     Status: None   Collection Time: 03/14/24 10:10 AM   Specimen: BLOOD RIGHT ARM  Result Value Ref Range   Specimen Description BLOOD RIGHT ARM    Special Requests      BOTTLES DRAWN AEROBIC AND ANAEROBIC Blood Culture results may not  be optimal due to an inadequate volume of blood received in culture bottles   Culture      NO GROWTH 5 DAYS Performed at [facility], [address] [place], [place] [address]    Report Status 03/19/2024 FINAL   Lactic acid, plasma     Status: None   Collection Time: 03/14/24  1:20 PM  Result Value Ref Range   Lactic Acid, Venous 1.7 0.5 - 1.9 mmol/L    Comment: Performed at [facility], [address] [place], [place] [address]  Respiratory (~20 pathogens) panel by PCR     Status: None   Collection Time: 03/14/24  3:39 PM   Specimen: Nasopharyngeal Swab; Respiratory  Result Value Ref Range   Adenovirus NOT DETECTED NOT DETECTED   Coronavirus 229E NOT DETECTED NOT DETECTED    Comment: (NOTE) The Coronavirus on the Respiratory Panel, DOES NOT test for the novel  Coronavirus (2019 nCoV)    Coronavirus HKU1 NOT DETECTED NOT DETECTED   Coronavirus NL63 NOT DETECTED NOT DETECTED   Coronavirus OC43 NOT DETECTED NOT DETECTED   Metapneumovirus NOT DETECTED NOT DETECTED   Rhinovirus / Enterovirus NOT DETECTED NOT DETECTED   Influenza A NOT DETECTED NOT DETECTED   Influenza B NOT DETECTED NOT DETECTED   Parainfluenza Virus 1 NOT DETECTED NOT DETECTED   Parainfluenza Virus 2 NOT DETECTED NOT DETECTED   Parainfluenza Virus 3 NOT DETECTED NOT DETECTED   Parainfluenza Virus 4 NOT DETECTED NOT DETECTED   Respiratory Syncytial Virus NOT DETECTED NOT DETECTED   Bordetella pertussis NOT DETECTED NOT DETECTED   Bordetella Parapertussis NOT DETECTED NOT DETECTED   Chlamydophila pneumoniae NOT DETECTED NOT DETECTED   Mycoplasma pneumoniae NOT DETECTED NOT DETECTED    Comment: Performed at [facility], [address], [place], [place] [address]  Expectorated Sputum Assessment w Gram Stain, Rflx to Resp Cult     Status: None   Collection Time: 03/14/24  3:40 PM   Specimen: Sputum  Result Value Ref Range  Specimen Description SPUTUM    Special Requests NONE     Sputum evaluation      Sputum specimen not acceptable for testing.  Please recollect.   CALLED TO JES SAVAGE @1750  03/14/24 MJU Performed at [facility], [address] [place], [place] [address]    Report Status 03/14/2024 FINAL   Heparin  level (unfractionated)     Status: None   Collection Time: 03/14/24  7:14 PM  Result Value Ref Range   Heparin  Unfractionated 0.46 0.30 - 0.70 IU/mL    Comment: (NOTE) The clinical reportable range upper limit is being lowered to >1.10 to align with the FDA approved guidance for the current laboratory assay.  If heparin  results are below expected values, and patient dosage has  been confirmed, suggest follow up testing of antithrombin III levels. Performed at [facility], [address] [place], [place] [address]   Heparin  level (unfractionated)     Status: None   Collection Time: 03/15/24  7:14 AM  Result Value Ref Range   Heparin  Unfractionated 0.47 0.30 - 0.70 IU/mL    Comment: (NOTE) The clinical reportable range upper limit is being lowered to >1.10 to align with the FDA approved guidance for the current laboratory assay.  If heparin  results are below expected values, and patient dosage has  been confirmed, suggest follow up testing of antithrombin III levels. Performed at [facility] [place], [address] [place], [place] [address]   CBC     Status: Abnormal   Collection Time: 03/15/24  7:14 AM  Result Value Ref Range   WBC 5.7 4.0 - 10.5 K/uL   RBC 2.78 (L) 3.87 - 5.11 MIL/uL   Hemoglobin 8.7 (L) 12.0 - 15.0 g/dL    Comment: REPEATED TO VERIFY   HCT 26.5 (L) 36.0 - 46.0 %   MCV 95.3 80.0 - 100.0 fL   MCH 31.3 26.0 - 34.0 pg   MCHC 32.8 30.0 - 36.0 g/dL   RDW 86.1 88.4 - 84.4 %   Platelets 255 150 - 400 K/uL   nRBC 0.0 0.0 - 0.2 %    Comment: Performed at [facility], [address] [place], [place] [address]  Basic metabolic panel with GFR     Status: Abnormal   Collection Time:  03/15/24  9:03 AM  Result Value Ref Range   Sodium 140 135 - 145 mmol/L   Potassium 4.4 3.5 - 5.1 mmol/L   Chloride 105 98 - 111 mmol/L   CO2 26 22 - 32 mmol/L   Glucose, Bld 149 (H) 70 - 99 mg/dL    Comment: Glucose reference range applies only to samples taken after fasting for at least 8 hours.   BUN 22 8 - 23 mg/dL   Creatinine, Ser 9.37 0.44 - 1.00 mg/dL   Calcium  8.8 (L) 8.9 - 10.3 mg/dL   GFR, Estimated >39 >39 mL/min    Comment: (NOTE) Calculated using the CKD-EPI Creatinine Equation (2021)    Anion gap 9 5 - 15    Comment: Performed at [facility] , [address] [place], [place] [address]  CBC     Status: Abnormal   Collection Time: 03/15/24  9:03 AM  Result Value Ref Range   WBC 6.5 4.0 - 10.5 K/uL   RBC 2.96 (L) 3.87 - 5.11 MIL/uL   Hemoglobin 9.1 (L) 12.0 - 15.0 g/dL   HCT 71.5 (L) 63.9 - 53.9 %   MCV 95.9 80.0 - 100.0 fL  MCH 30.7 26.0 - 34.0 pg   MCHC 32.0 30.0 - 36.0 g/dL   RDW 85.9 88.4 - 84.4 %   Platelets 272 150 - 400 K/uL   nRBC 0.0 0.0 - 0.2 %    Comment: Performed at [facility], [address] [place], [place] [address]  Magnesium      Status: None   Collection Time: 03/15/24  9:03 AM  Result Value Ref Range   Magnesium  1.9 1.7 - 2.4 mg/dL    Comment: Performed at [facility], [address] [place], [place] [address]  Phosphorus     Status: None   Collection Time: 03/15/24  9:03 AM  Result Value Ref Range   Phosphorus 3.2 2.5 - 4.6 mg/dL    Comment: Performed at [facility], [address] [place], [place] [address]  Vitamin B12     Status: None   Collection Time: 03/15/24  9:03 AM  Result Value Ref Range   Vitamin B-12 224 180 - 914 pg/mL    Comment: (NOTE) This assay is not validated for testing neonatal or myeloproliferative syndrome specimens for Vitamin B12 levels. Performed at [facility], [address] [place], [place] [address]   VITAMIN D  25 Hydroxy (Vit-D Deficiency,  Fractures)     Status: None   Collection Time: 03/15/24  9:03 AM  Result Value Ref Range   Vit D, 25-Hydroxy 64.20 30 - 100 ng/mL    Comment: (NOTE) Vitamin D  deficiency has been defined by the Institute of Medicine  and an Endocrine Society practice guideline as a level of serum 25-OH  vitamin D  less than 20 ng/mL (1,2). The Endocrine Society went on to  further define vitamin D  insufficiency as a level between 21 and 29  ng/mL (2).  1. IOM (Institute of Medicine). 2010. Dietary reference intakes for  calcium  and D. [place]: [employer]. 2. Holick MF, Binkley [place], Bischoff-Ferrari HA, et al. Evaluation,  treatment, and prevention of vitamin D  deficiency: an Endocrine  Society clinical practice guideline, JCEM. 2011 Jul; 96(7): 1911-30.  Performed at [facility], [address], [place], [place] [address]   Resp panel by RT-PCR (RSV, Flu A&B, Covid) Anterior Nasal Swab     Status: None   Collection Time: 03/15/24  4:10 PM   Specimen: Anterior Nasal Swab  Result Value Ref Range   SARS Coronavirus 2 by RT PCR NEGATIVE NEGATIVE    Comment: (NOTE) SARS-CoV-2 target nucleic acids are NOT DETECTED.  The SARS-CoV-2 RNA is generally detectable in upper respiratory specimens during the acute phase of infection. The lowest concentration of SARS-CoV-2 viral copies this assay can detect is 138 copies/mL. A negative result does not preclude SARS-Cov-2 infection and should not be used as the sole basis for treatment or other patient management decisions. A negative result may occur with  improper specimen collection/handling, submission of specimen other than nasopharyngeal swab, presence of viral mutation(s) within the areas targeted by this assay, and inadequate number of viral copies(<138 copies/mL). A negative result must be combined with clinical observations, patient history, and epidemiological information. The expected result is Negative.  Fact Sheet  for Patients:  BloggerCourse.com  Fact Sheet for Healthcare Providers:  SeriousBroker.it  This test is no t yet approved or cleared by the [place]  FDA and  has been authorized for detection and/or diagnosis of SARS-CoV-2 by FDA under an Emergency Use Authorization (EUA). This EUA will remain  in effect (meaning this  test can be used) for the duration of the COVID-19 declaration under Section 564(b)(1) of the Act, 21 U.S.C.section 360bbb-3(b)(1), unless the authorization is terminated  or revoked sooner.       Influenza A by PCR NEGATIVE NEGATIVE   Influenza B by PCR NEGATIVE NEGATIVE    Comment: (NOTE) The Xpert Xpress SARS-CoV-2/FLU/RSV plus assay is intended as an aid in the diagnosis of influenza from Nasopharyngeal swab specimens and should not be used as a sole basis for treatment. Nasal washings and aspirates are unacceptable for Xpert Xpress SARS-CoV-2/FLU/RSV testing.  Fact Sheet for Patients: BloggerCourse.com  Fact Sheet for Healthcare Providers: SeriousBroker.it  This test is not yet approved or cleared by the [place]  FDA and has been authorized for detection and/or diagnosis of SARS-CoV-2 by FDA under an Emergency Use Authorization (EUA). This EUA will remain in effect (meaning this test can be used) for the duration of the COVID-19 declaration under Section 564(b)(1) of the Act, 21 U.S.C. section 360bbb-3(b)(1), unless the authorization is terminated or revoked.     Resp Syncytial Virus by PCR NEGATIVE NEGATIVE    Comment: (NOTE) Fact Sheet for Patients: BloggerCourse.com  Fact Sheet for Healthcare Providers: SeriousBroker.it  This test is not yet approved or cleared by the [place]  FDA and has been authorized for detection and/or diagnosis of SARS-CoV-2 by FDA under an Emergency Use  Authorization (EUA). This EUA will remain in effect (meaning this test can be used) for the duration of the COVID-19 declaration under Section 564(b)(1) of the Act, 21 U.S.C. section 360bbb-3(b)(1), unless the authorization is terminated or revoked.  Performed at [facility], [address] [place], [place] [address]   Blood gas, venous     Status: Abnormal   Collection Time: 03/15/24  4:26 PM  Result Value Ref Range   O2 Content 4.0 L/min   Delivery systems NASAL CANNULA    pH, Ven 7.46 (H) 7.25 - 7.43   pCO2, Ven 39 (L) 44 - 60 mmHg   pO2, Ven 42 32 - 45 mmHg   Bicarbonate 27.7 20.0 - 28.0 mmol/L   Acid-Base Excess 3.7 (H) 0.0 - 2.0 mmol/L   O2 Saturation 70.8 %   Patient temperature 37.0    Collection site VEIN     Comment: Performed at [facility], [address] [place], [place] [address]  Heparin  level (unfractionated)     Status: None   Collection Time: 03/16/24  6:13 AM  Result Value Ref Range   Heparin  Unfractionated 0.56 0.30 - 0.70 IU/mL    Comment: (NOTE) The clinical reportable range upper limit is being lowered to >1.10 to align with the FDA approved guidance for the current laboratory assay.  If heparin  results are below expected values, and patient dosage has  been confirmed, suggest follow up testing of antithrombin III levels. Performed at [facility], [address] [place], [place] [address]   Basic metabolic panel with GFR     Status: Abnormal   Collection Time: 03/16/24  6:13 AM  Result Value Ref Range   Sodium 142 135 - 145 mmol/L   Potassium 4.4 3.5 - 5.1 mmol/L   Chloride 106 98 - 111 mmol/L   CO2 24 22 - 32 mmol/L   Glucose, Bld 121 (H) 70 - 99 mg/dL    Comment: Glucose reference range applies only to samples taken after fasting for at least 8 hours.   BUN 24 (H) 8 - 23 mg/dL   Creatinine, Ser 9.42  0.44 - 1.00 mg/dL   Calcium  8.7 (L) 8.9 - 10.3 mg/dL   GFR, Estimated >39 >39 mL/min    Comment:  (NOTE) Calculated using the CKD-EPI Creatinine Equation (2021)    Anion gap 12 5 - 15    Comment: Performed at [facility], [address] [place], [place] [address]  CBC     Status: Abnormal   Collection Time: 03/16/24  6:13 AM  Result Value Ref Range   WBC 9.8 4.0 - 10.5 K/uL   RBC 2.46 (L) 3.87 - 5.11 MIL/uL   Hemoglobin 7.5 (L) 12.0 - 15.0 g/dL   HCT 76.4 (L) 63.9 - 53.9 %   MCV 95.5 80.0 - 100.0 fL   MCH 30.5 26.0 - 34.0 pg   MCHC 31.9 30.0 - 36.0 g/dL   RDW 85.5 88.4 - 84.4 %   Platelets 267 150 - 400 K/uL   nRBC 0.0 0.0 - 0.2 %    Comment: Performed at [facility], [address] [place], [place] [address]  Magnesium      Status: None   Collection Time: 03/16/24  6:13 AM  Result Value Ref Range   Magnesium  2.0 1.7 - 2.4 mg/dL    Comment: Performed at [facility], [address] [place], [place] [address]  Phosphorus     Status: None   Collection Time: 03/16/24  6:13 AM  Result Value Ref Range   Phosphorus 3.5 2.5 - 4.6 mg/dL    Comment: Performed at [facility], [address] [place], [place] [address]  Hemoglobin and hematocrit, blood     Status: Abnormal   Collection Time: 03/16/24  4:03 PM  Result Value Ref Range   Hemoglobin 7.0 (L) 12.0 - 15.0 g/dL   HCT 78.6 (L) 63.9 - 53.9 %    Comment: Performed at [facility], [address] [place], [place] [address]  ABO/Rh     Status: None   Collection Time: 03/16/24  4:03 PM  Result Value Ref Range   ABO/RH(D) AB POS    DAT, IgG NOT NEEDED   Prepare RBC (crossmatch)     Status: None   Collection Time: 03/16/24  4:41 PM  Result Value Ref Range   Order Confirmation      ORDER PROCESSED BY BLOOD BANK Performed at [facility], [address] [place], [place] [address]   Type and screen [facility]     Status: None   Collection Time: 03/16/24  4:51 PM  Result Value Ref Range   ABO/RH(D) AB POS    Antibody Screen NEG    Sample  Expiration 03/19/2024,2359    Unit Number [id number]    Blood Component Type RED CELLS,LR    Unit division 00    Status of Unit ISSUED,FINAL    Transfusion Status OK TO TRANSFUSE    Crossmatch Result Compatible    Unit Number [id number]    Blood Component Type RED CELLS,LR    Unit division 00    Status of Unit ISSUED,FINAL    Transfusion Status OK TO TRANSFUSE    Crossmatch Result      Compatible Performed at [facility], [address] [place], [place] [address]   BPAM RBC     Status: None   Collection Time: 03/16/24  4:51 PM  Result Value Ref Range   ISSUE DATE / TIME 797491957765    Blood Product Unit Number [id number]    PRODUCT CODE [id number]  Unit Type and Rh 0600    Blood Product Expiration Date 797491837640    ISSUE DATE / TIME 797491929057    Blood Product Unit Number [id number]    PRODUCT CODE [id number]    Unit Type and Rh 6200    Blood Product Expiration Date 797490937640   Basic metabolic panel with GFR     Status: Abnormal   Collection Time: 03/17/24  4:43 AM  Result Value Ref Range   Sodium 137 135 - 145 mmol/L   Potassium 4.7 3.5 - 5.1 mmol/L   Chloride 104 98 - 111 mmol/L   CO2 26 22 - 32 mmol/L   Glucose, Bld 134 (H) 70 - 99 mg/dL    Comment: Glucose reference range applies only to samples taken after fasting for at least 8 hours.   BUN 28 (H) 8 - 23 mg/dL   Creatinine, Ser 9.14 0.44 - 1.00 mg/dL   Calcium  8.1 (L) 8.9 - 10.3 mg/dL   GFR, Estimated >39 >39 mL/min    Comment: (NOTE) Calculated using the CKD-EPI Creatinine Equation (2021)    Anion gap 7 5 - 15    Comment: Performed at [facility], [address] [place], [place] [address]  CBC     Status: Abnormal   Collection Time: 03/17/24  4:43 AM  Result Value Ref Range   WBC 9.2 4.0 - 10.5 K/uL   RBC 2.82 (L) 3.87 - 5.11 MIL/uL   Hemoglobin 8.1 (L) 12.0 - 15.0 g/dL   HCT 74.5 (L) 63.9 - 53.9 %   MCV 90.1 80.0 - 100.0 fL   MCH 28.7 26.0 - 34.0 pg   MCHC 31.9  30.0 - 36.0 g/dL   RDW 80.6 (H) 88.4 - 84.4 %   Platelets 218 150 - 400 K/uL   nRBC 0.5 (H) 0.0 - 0.2 %    Comment: Performed at [facility], [address], [place], [place] [address]  Magnesium      Status: None   Collection Time: 03/17/24  4:43 AM  Result Value Ref Range   Magnesium  2.0 1.7 - 2.4 mg/dL    Comment: Performed at [facility], [address], [place], [place] [address]  Phosphorus     Status: None   Collection Time: 03/17/24  4:43 AM  Result Value Ref Range   Phosphorus 3.9 2.5 - 4.6 mg/dL    Comment: Performed at [facility], [address] [place], [place] [address]  Hemoglobin and hematocrit, blood     Status: Abnormal   Collection Time: 03/17/24  4:49 PM  Result Value Ref Range   Hemoglobin 7.6 (L) 12.0 - 15.0 g/dL   HCT 76.9 (L) 63.9 - 53.9 %    Comment: Performed at [facility], [address], [place], [place] [address]  Basic metabolic panel with GFR     Status: Abnormal   Collection Time: 03/18/24  5:29 AM  Result Value Ref Range   Sodium 138 135 - 145 mmol/L   Potassium 5.6 (H) 3.5 - 5.1 mmol/L   Chloride 105 98 - 111 mmol/L   CO2 27 22 - 32 mmol/L   Glucose, Bld 121 (H) 70 - 99 mg/dL    Comment: Glucose reference range applies only to samples taken after fasting for at least 8 hours.   BUN 33 (H) 8 - 23 mg/dL   Creatinine, Ser 9.04 0.44 - 1.00 mg/dL   Calcium  8.5 (L) 8.9 - 10.3 mg/dL   GFR, Estimated 58 (L) >  60 mL/min    Comment: (NOTE) Calculated using the CKD-EPI Creatinine Equation (2021)    Anion gap 6 5 - 15    Comment: Performed at [facility], [address] [place], [place] [address]  Magnesium      Status: None   Collection Time: 03/18/24  5:29 AM  Result Value Ref Range   Magnesium  2.3 1.7 - 2.4 mg/dL    Comment: Performed at [facility], [address] [place], [place] [address]  Phosphorus     Status: None   Collection Time: 03/18/24  5:29 AM  Result Value Ref Range    Phosphorus 3.0 2.5 - 4.6 mg/dL    Comment: Performed at [facility], [address] [place], [place] [address]  CBC     Status: Abnormal   Collection Time: 03/18/24  7:08 AM  Result Value Ref Range   WBC 10.2 4.0 - 10.5 K/uL   RBC 2.75 (L) 3.87 - 5.11 MIL/uL   Hemoglobin 8.0 (L) 12.0 - 15.0 g/dL   HCT 75.3 (L) 63.9 - 53.9 %   MCV 89.5 80.0 - 100.0 fL   MCH 29.1 26.0 - 34.0 pg   MCHC 32.5 30.0 - 36.0 g/dL   RDW 80.9 (H) 88.4 - 84.4 %   Platelets 203 150 - 400 K/uL   nRBC 0.3 (H) 0.0 - 0.2 %    Comment: Performed at [facility], [address], [place], [place] [address]  Basic metabolic panel     Status: Abnormal   Collection Time: 03/18/24 12:52 PM  Result Value Ref Range   Sodium 139 135 - 145 mmol/L   Potassium 5.4 (H) 3.5 - 5.1 mmol/L   Chloride 104 98 - 111 mmol/L   CO2 28 22 - 32 mmol/L   Glucose, Bld 137 (H) 70 - 99 mg/dL    Comment: Glucose reference range applies only to samples taken after fasting for at least 8 hours.   BUN 36 (H) 8 - 23 mg/dL   Creatinine, Ser 8.92 (H) 0.44 - 1.00 mg/dL   Calcium  8.8 (L) 8.9 - 10.3 mg/dL   GFR, Estimated 50 (L) >60 mL/min    Comment: (NOTE) Calculated using the CKD-EPI Creatinine Equation (2021)    Anion gap 7 5 - 15    Comment: Performed at [facility], [address], [place], [place] [address]  Lactate dehydrogenase (pleural or peritoneal fluid)     Status: Abnormal   Collection Time: 03/18/24  3:28 PM  Result Value Ref Range   LD, Fluid 47 (H) 3 - 23 U/L    Comment: (NOTE) Results should be evaluated in conjunction with serum values    Fluid Type-FLDH CYTO PLEU     Comment: Performed at [facility], [address] [place], [place] [address]  Body fluid cell count with differential     Status: Abnormal   Collection Time: 03/18/24  3:28 PM  Result Value Ref Range   Fluid Type-FCT CYTO PLEU    Color, Fluid YELLOW YELLOW   Appearance, Fluid HAZY (A) CLEAR   Total Nucleated  Cell Count, Fluid 500 cu mm   Neutrophil Count, Fluid 38 %   Lymphs, Fluid 49 %   Monocyte-Macrophage-Serous Fluid 13 %   Eos, Fluid 0 %    Comment: Performed at [facility], [address] [place], [place] [address]  Protein, pleural or peritoneal fluid     Status: None   Collection Time: 03/18/24  3:28  PM  Result Value Ref Range   Total protein, fluid <3.0 g/dL    Comment: (NOTE) No normal range established for this test Results should be evaluated in conjunction with serum values    Fluid Type-FTP CYTO PLEU     Comment: Performed at [facility], [address], [place], [place] [address]  Body fluid culture w Gram Stain     Status: None   Collection Time: 03/18/24  3:28 PM   Specimen: PATH Cytology Pleural fluid  Result Value Ref Range   Specimen Description      PLEURAL Performed at [facility], [address], [place], [place] [address]    Special Requests      PLEURAL Performed at [facility], [address] [place], [place] [address]    Gram Stain NO WBC SEEN NO ORGANISMS SEEN     Culture      NO GROWTH 4 DAYS Performed at [facility], [address], [place], [place] [address]    Report Status 03/22/2024 FINAL   Pathologist smear review     Status: None   Collection Time: 03/18/24  3:28 PM  Result Value Ref Range   Path Review Cytospin of pleural fluid is reviewed.     Comment: Negative for malignancy. Histiocytes, reactive mesothelial cells, and neutrophils. Reviewed by Wally Highland, MD Performed at [facility], [address] [place], [place] [address]   Basic metabolic panel with GFR     Status: Abnormal   Collection Time: 03/19/24  3:12 AM  Result Value Ref Range   Sodium 137 135 - 145 mmol/L   Potassium 5.4 (H) 3.5 - 5.1 mmol/L   Chloride 103 98 - 111 mmol/L   CO2 27 22 - 32 mmol/L   Glucose, Bld 158 (H) 70 - 99 mg/dL    Comment: Glucose reference range applies only to samples taken  after fasting for at least 8 hours.   BUN 37 (H) 8 - 23 mg/dL   Creatinine, Ser 9.08 0.44 - 1.00 mg/dL   Calcium  8.5 (L) 8.9 - 10.3 mg/dL   GFR, Estimated >39 >39 mL/min    Comment: (NOTE) Calculated using the CKD-EPI Creatinine Equation (2021)    Anion gap 7 5 - 15    Comment: Performed at [facility], [address] [place], [place] [address]  CBC     Status: Abnormal   Collection Time: 03/19/24  3:12 AM  Result Value Ref Range   WBC 7.1 4.0 - 10.5 K/uL   RBC 2.30 (L) 3.87 - 5.11 MIL/uL   Hemoglobin 6.7 (L) 12.0 - 15.0 g/dL   HCT 78.9 (L) 63.9 - 53.9 %   MCV 91.3 80.0 - 100.0 fL   MCH 29.1 26.0 - 34.0 pg   MCHC 31.9 30.0 - 36.0 g/dL   RDW 81.7 (H) 88.4 - 84.4 %   Platelets 200 150 - 400 K/uL   nRBC 0.0 0.0 - 0.2 %    Comment: Performed at [facility], [address] [place], [place] [address]  Magnesium      Status: None   Collection Time: 03/19/24  3:12 AM  Result Value Ref Range   Magnesium  2.0 1.7 - 2.4 mg/dL    Comment: Performed at [facility], [address], [place], [place] [address]  Phosphorus     Status: None   Collection Time: 03/19/24  3:12 AM  Result Value Ref Range   Phosphorus 3.0 2.5 - 4.6  mg/dL    Comment: Performed at [facility], [address] [place], [place] [address]  Hepatic function panel     Status: Abnormal   Collection Time: 03/19/24  3:12 AM  Result Value Ref Range   Total Protein 4.6 (L) 6.5 - 8.1 g/dL   Albumin 2.5 (L) 3.5 - 5.0 g/dL   AST 892 (H) 15 - 41 U/L   ALT 226 (H) 0 - 44 U/L   Alkaline Phosphatase 91 38 - 126 U/L   Total Bilirubin 0.7 0.0 - 1.2 mg/dL   Bilirubin, Direct <9.8 0.0 - 0.2 mg/dL   Indirect Bilirubin NOT CALCULATED 0.3 - 0.9 mg/dL    Comment: Performed at [facility], [address], [place], [place] [address]  Prepare RBC (crossmatch)     Status: None   Collection Time: 03/19/24  7:42 AM  Result Value Ref Range   Order Confirmation      ORDER PROCESSED BY  BLOOD BANK Performed at [facility] [address], [place], [place] [address]   Lactate dehydrogenase     Status: None   Collection Time: 03/19/24  9:20 AM  Result Value Ref Range   LDH 191 98 - 192 U/L    Comment: Performed at [facility], [address] [place], [place] [address]  Procalcitonin     Status: None   Collection Time: 03/19/24  9:20 AM  Result Value Ref Range   Procalcitonin <0.10 ng/mL    Comment:        Interpretation: PCT (Procalcitonin) <= 0.5 ng/mL: Systemic infection (sepsis) is not likely. Local bacterial infection is possible. (NOTE)       Sepsis PCT Algorithm           Lower Respiratory Tract                                      Infection PCT Algorithm    ----------------------------     ----------------------------         PCT < 0.25 ng/mL                PCT < 0.10 ng/mL          Strongly encourage             Strongly discourage   discontinuation of antibiotics    initiation of antibiotics    ----------------------------     -----------------------------       PCT 0.25 - 0.50 ng/mL            PCT 0.10 - 0.25 ng/mL               OR       >80% decrease in PCT            Discourage initiation of                                            antibiotics      Encourage discontinuation           of antibiotics    ----------------------------     -----------------------------         PCT >= 0.50 ng/mL              PCT 0.26 - 0.50 ng/mL  AND        <80% decrease in PCT             Encourage initiation of                                             antibiotics       Encourage continuation           of antibiotics    ----------------------------     -----------------------------        PCT >= 0.50 ng/mL                  PCT > 0.50 ng/mL               AND         increase in PCT                  Strongly encourage                                      initiation of antibiotics    Strongly encourage escalation           of  antibiotics                                     -----------------------------                                           PCT <= 0.25 ng/mL                                                 OR                                        > 80% decrease in PCT                                      Discontinue / Do not initiate                                             antibiotics  Performed at [facility], [address] [place], [place] [address]   Hemoglobin and hematocrit, blood     Status: Abnormal   Collection Time: 03/19/24  4:55 PM  Result Value Ref Range   Hemoglobin 10.6 (L) 12.0 - 15.0 g/dL    Comment: REPEATED TO VERIFY   HCT 31.2 (L) 36.0 - 46.0 %    Comment: Performed at [facility], [address] [place], [place] [address]  Basic metabolic panel with GFR     Status: Abnormal   Collection Time: 03/20/24  4:49 AM  Result Value Ref Range   Sodium 138 135 - 145 mmol/L   Potassium 4.8 3.5 - 5.1 mmol/L   Chloride 98 98 - 111 mmol/L   CO2 31 22 - 32 mmol/L   Glucose, Bld 140 (H) 70 - 99 mg/dL    Comment: Glucose reference range applies only to samples taken after fasting for at least 8 hours.   BUN 41 (H) 8 - 23 mg/dL   Creatinine, Ser 9.21 0.44 - 1.00 mg/dL   Calcium  8.8 (L) 8.9 - 10.3 mg/dL   GFR, Estimated >39 >39 mL/min    Comment: (NOTE) Calculated using the CKD-EPI Creatinine Equation (2021)    Anion gap 9 5 - 15    Comment: Performed at [facility], [address] [place], [place] [address]  CBC     Status: Abnormal   Collection Time: 03/20/24  4:49 AM  Result Value Ref Range   WBC 7.3 4.0 - 10.5 K/uL   RBC 3.11 (L) 3.87 - 5.11 MIL/uL   Hemoglobin 9.6 (L) 12.0 - 15.0 g/dL   HCT 71.5 (L) 63.9 - 53.9 %   MCV 91.3 80.0 - 100.0 fL   MCH 30.9 26.0 - 34.0 pg   MCHC 33.8 30.0 - 36.0 g/dL   RDW 83.7 (H) 88.4 - 84.4 %   Platelets 212 150 - 400 K/uL   nRBC 0.3 (H) 0.0 - 0.2 %    Comment: Performed at [facility], [address] [place], [place] [address]  Hepatic function panel     Status: Abnormal   Collection Time: 03/20/24  4:49 AM  Result Value Ref Range   Total Protein 4.8 (L) 6.5 - 8.1 g/dL   Albumin 2.6 (L) 3.5 - 5.0 g/dL   AST 99 (H) 15 - 41 U/L   ALT 248 (H) 0 - 44 U/L   Alkaline Phosphatase 113 38 - 126 U/L   Total Bilirubin 0.6 0.0 - 1.2 mg/dL   Bilirubin, Direct <9.8 0.0 - 0.2 mg/dL   Indirect Bilirubin NOT CALCULATED 0.3 - 0.9 mg/dL    Comment: Performed at [facility], [address] [place], [place] [address]  Basic metabolic panel with GFR     Status: Abnormal   Collection Time: 03/21/24  2:03 AM  Result Value Ref Range   Sodium 137 135 - 145 mmol/L   Potassium 4.3 3.5 - 5.1 mmol/L   Chloride 99 98 - 111 mmol/L   CO2 30 22 - 32 mmol/L   Glucose, Bld 124 (H) 70 - 99 mg/dL    Comment: Glucose reference range applies only to samples taken after fasting for at least 8 hours.   BUN 39 (H) 8 - 23 mg/dL   Creatinine, Ser 9.38 0.44 - 1.00 mg/dL   Calcium  8.9 8.9 - 10.3 mg/dL   GFR, Estimated >39 >39 mL/min    Comment: (NOTE) Calculated using the CKD-EPI Creatinine Equation (2021)    Anion gap 8 5 - 15    Comment: Performed at [facility], [address] [place], [place] [address]  CBC     Status: Abnormal   Collection Time: 03/21/24  2:03 AM  Result Value Ref Range   WBC 11.4 (H) 4.0 - 10.5 K/uL   RBC 3.29 (L) 3.87 - 5.11 MIL/uL   Hemoglobin 10.0 (L) 12.0 - 15.0 g/dL   HCT 69.5 (L) 63.9 - 53.9 %   MCV 92.4 80.0 - 100.0 fL   MCH 30.4 26.0 - 34.0  pg   MCHC 32.9 30.0 - 36.0 g/dL   RDW 83.9 (H) 88.4 - 84.4 %   Platelets 248 150 - 400 K/uL   nRBC 0.0 0.0 - 0.2 %    Comment: Performed at [facility], [address] [place], [place] [address]  Hepatic function panel     Status: Abnormal   Collection Time: 03/21/24  2:03 AM  Result Value Ref Range   Total Protein 4.9 (L) 6.5 - 8.1 g/dL   Albumin 2.7 (L) 3.5 - 5.0 g/dL   AST 73 (H) 15 - 41 U/L   ALT  202 (H) 0 - 44 U/L   Alkaline Phosphatase 101 38 - 126 U/L   Total Bilirubin 0.7 0.0 - 1.2 mg/dL   Bilirubin, Direct 0.1 0.0 - 0.2 mg/dL   Indirect Bilirubin 0.6 0.3 - 0.9 mg/dL    Comment: Performed at [facility], [address] [place], [place] [address]  Basic metabolic panel with GFR     Status: Abnormal   Collection Time: 03/22/24  5:43 AM  Result Value Ref Range   Sodium 138 135 - 145 mmol/L   Potassium 3.9 3.5 - 5.1 mmol/L   Chloride 99 98 - 111 mmol/L   CO2 31 22 - 32 mmol/L   Glucose, Bld 102 (H) 70 - 99 mg/dL    Comment: Glucose reference range applies only to samples taken after fasting for at least 8 hours.   BUN 30 (H) 8 - 23 mg/dL   Creatinine, Ser 9.24 0.44 - 1.00 mg/dL   Calcium  8.3 (L) 8.9 - 10.3 mg/dL   GFR, Estimated >39 >39 mL/min    Comment: (NOTE) Calculated using the CKD-EPI Creatinine Equation (2021)    Anion gap 8 5 - 15    Comment: Performed at [facility], [address] [place], [place] [address]  CBC     Status: Abnormal   Collection Time: 03/22/24  5:43 AM  Result Value Ref Range   WBC 7.1 4.0 - 10.5 K/uL   RBC 3.08 (L) 3.87 - 5.11 MIL/uL   Hemoglobin 9.4 (L) 12.0 - 15.0 g/dL   HCT 70.3 (L) 63.9 - 53.9 %   MCV 96.1 80.0 - 100.0 fL   MCH 30.5 26.0 - 34.0 pg   MCHC 31.8 30.0 - 36.0 g/dL   RDW 84.0 (H) 88.4 - 84.4 %   Platelets 229 150 - 400 K/uL   nRBC 0.0 0.0 - 0.2 %    Comment: Performed at [facility], [address] [place], [place] [address]  Hepatic function panel     Status: Abnormal   Collection Time: 03/22/24  5:43 AM  Result Value Ref Range   Total Protein 4.3 (L) 6.5 - 8.1 g/dL   Albumin 2.3 (L) 3.5 - 5.0 g/dL   AST 48 (H) 15 - 41 U/L   ALT 143 (H) 0 - 44 U/L   Alkaline Phosphatase 87 38 - 126 U/L   Total Bilirubin 0.9 0.0 - 1.2 mg/dL   Bilirubin, Direct 0.1 0.0 - 0.2 mg/dL   Indirect Bilirubin 0.8 0.3 - 0.9 mg/dL    Comment: Performed at [facility], [address]  [place], [place] [address]    Radiology US  Abdomen Limited RUQ (LIVER/GB) Result Date: 03/21/2024 CLINICAL DATA:  Cirrhosis. EXAM: ULTRASOUND ABDOMEN LIMITED RIGHT UPPER QUADRANT COMPARISON:  CT abdomen 03/15/2024. FINDINGS: Gallbladder: No gallstones or wall thickening visualized. No sonographic Murphy sign noted by sonographer. Common bile duct:  Diameter: 3 mm, within normal limits. No intrahepatic biliary ductal dilatation. Liver: Diffusely increased in echogenicity. No definite focal lesion. Portal vein is patent on color Doppler imaging with normal direction of blood flow towards the liver. Other: None. IMPRESSION: Hepatic steatosis. Electronically Signed   By: Newell Eke M.D.   On: 03/21/2024 13:35   DG Chest Port 1 View Result Date: 03/18/2024 CLINICAL DATA:  Status post left thoracentesis. EXAM: PORTABLE CHEST 1 VIEW COMPARISON:  March 16, 2024. FINDINGS: The heart size and mediastinal contours are within normal limits. No acute pulmonary abnormality is noted. Stable probable scarring seen in right upper lobe. The visualized skeletal structures are unremarkable. IMPRESSION: No active disease. Electronically Signed   By: Lynwood Landy Raddle M.D.   On: 03/18/2024 16:19   US  THORACENTESIS ASP PLEURAL SPACE W/IMG GUIDE Result Date: 03/18/2024 INDICATION: 89 year old female with acute on chronic hypoxic respiratory failure due to pneumonia and COPD exacerbation, on 2 L O2 at baseline, with new onset left pleural effusion. IR was requested for diagnostic and therapeutic thoracentesis. EXAM: ULTRASOUND GUIDED DIAGNOSTIC AND THERAPEUTIC THORACENTESIS MEDICATIONS: 5 cc of 1% lidocaine  COMPLICATIONS: None immediate. PROCEDURE: An ultrasound guided thoracentesis was thoroughly discussed with the patient and questions answered. The benefits, risks, alternatives and complications were also discussed. The patient understands and wishes to proceed with the procedure. Written consent was obtained. Ultrasound was  performed to localize and mark an adequate pocket of fluid in the left chest. The area was then prepped and draped in the normal sterile fashion. 1% Lidocaine  was used for local anesthesia. Under ultrasound guidance a 6 Fr Safe-T-Centesis catheter was introduced. Thoracentesis was performed. The catheter was removed and a dressing applied. FINDINGS: A total of approximately 1.1 L of clear, straw-colored pleural fluid was removed. Samples were sent to the laboratory as requested by the clinical team. IMPRESSION: Successful ultrasound guided left thoracentesis yielding 1.1 L of pleural fluid. Procedure performed by Carlin Griffon, PA-C Electronically Signed   By: CHRISTELLA.  Shick M.D.   On: 03/18/2024 15:40   DG Chest Port 1 View Result Date: 03/16/2024 EXAM: 1 VIEW XRAY OF THE CHEST 03/16/2024 09:32:18 PM COMPARISON: 03/15/2024 CLINICAL HISTORY: Shortness of breath (SOB) FINDINGS: LUNGS AND PLEURA: Left basilar airspace opacities and small left pleural effusion. Heterogeneous opacities in the right mid lung. No pneumothorax. Hyperinflation and chronic bronchitic changes. HEART AND MEDIASTINUM: Stable cardiomediastinal silhouette. Aortic atherosclerotic calcification. BONES AND SOFT TISSUES: No acute osseous abnormality. IMPRESSION: 1. Stable left basilar airspace opacities and small left pleural effusion. 2. Emphysema. Electronically signed by: Norman Gatlin MD 03/16/2024 10:12 PM EDT RP Workstation: [id number]   DG Abd Portable 1V-Small Bowel Obstruction Protocol-initial, 8 hr delay Result Date: 03/16/2024 CLINICAL DATA:  10 hour delay or small-bowel obstruction. EXAM: PORTABLE ABDOMEN - 1 VIEW COMPARISON:  CT and plain films earlier today. FINDINGS: Contrast material noted throughout to the colon. Small bowel is decompressed. Calcifications throughout the pancreas. No free air or organomegaly. NG tube in the stomach. IMPRESSION: Contrast material throughout the colon with decompressed small bowel. No current evidence  for bowel obstruction. Chronic pancreatitis changes. Electronically Signed   By: Franky Crease M.D.   On: 03/16/2024 12:55   PERIPHERAL VASCULAR CATHETERIZATION Result Date: 03/16/2024 See surgical note for result.  DG Abd 1 View Result Date: 03/15/2024 EXAM: 1 VIEW XRAY OF THE ABDOMEN 03/15/2024 09:33:04 PM COMPARISON: None available. CLINICAL HISTORY: Encounter for nasogastric (NG) tube placement; encounter for imaging study to confirm nasogastric tube placement. NG tube placement  verification. FINDINGS: Enteric tube tip inside stomach. Remainder unchanged from same day CT given differences in technique. IMPRESSION: 1. Enteric tube tip is appropriately positioned within the stomach. Electronically signed by: Norman Gatlin MD 03/15/2024 09:41 PM EDT RP Workstation: [id number]   CT CHEST ABDOMEN PELVIS W CONTRAST Result Date: 03/15/2024 CLINICAL DATA:  Shortness of breath, abnormal chest x-ray, small-bowel obstruction EXAM: CT CHEST, ABDOMEN, AND PELVIS WITH CONTRAST TECHNIQUE: Multidetector CT imaging of the chest, abdomen and pelvis was performed following the standard protocol during bolus administration of intravenous contrast. RADIATION DOSE REDUCTION: This exam was performed according to the departmental dose-optimization program which includes automated exposure control, adjustment of the mA and/or kV according to patient size and/or use of iterative reconstruction technique. CONTRAST:  80mL OMNIPAQUE  IOHEXOL  300 MG/ML  SOLN COMPARISON:  09/04/2023, 03/15/2024 FINDINGS: CT CHEST FINDINGS Cardiovascular: The heart is unremarkable without pericardial effusion. No evidence of thoracic aortic aneurysm or dissection. Atherosclerosis of the aorta and coronary vasculature. Mediastinum/Nodes: No enlarged mediastinal, hilar, or axillary lymph nodes. Thyroid  gland, trachea, and esophagus demonstrate no significant findings. Lungs/Pleura: Stable partially loculated left pleural effusion with compressive  atelectasis within the left lower lobe. Trace free-flowing right pleural effusion with minimal dependent right lower lobe atelectasis. Upper lobe predominant emphysema. No pneumothorax. Right lower lobe bronchial wall thickening with areas of mucoid impaction. Musculoskeletal: No acute or destructive bony abnormalities. Reconstructed images demonstrate no additional findings. CT ABDOMEN PELVIS FINDINGS Hepatobiliary: No focal liver abnormality is seen. No gallstones, gallbladder wall thickening, or biliary dilatation. Pancreas: Stable diffuse pancreatic parenchymal atrophy with coarse calcifications compatible with sequela of chronic calcific pancreatitis. Stable cystic dilatation of pancreatic duct. No acute inflammatory changes. Spleen: Normal in size without focal abnormality. Adrenals/Urinary Tract: Adrenal glands are unremarkable. Kidneys are normal, without renal calculi, focal lesion, or hydronephrosis. Bladder is unremarkable. Stomach/Bowel: Dilated proximal small bowel measuring up to 3.6 cm, with numerous gas fluid levels, consistent with small-bowel obstruction. Exact transition point is not identified, but normal caliber distal jejunum and ileum are identified. Moderate gas and stool within the colon. Vascular/Lymphatic: Aortic atherosclerosis. No enlarged abdominal or pelvic lymph nodes. Reproductive: Uterus and bilateral adnexa are unremarkable. Other: Trace pelvic free fluid. No free intraperitoneal gas. No abdominal wall hernia. Musculoskeletal: No acute or destructive bony abnormalities. Reconstructed images demonstrate no additional findings. IMPRESSION: 1. Moderate partially loculated left pleural effusion, with compressive atelectasis in the dependent left lower lobe, not appreciably changed since prior study. 2. New trace free-flowing right pleural effusion with minimal right lower lobe atelectasis. 3. Small-bowel obstruction, with dilated proximal small bowel measuring up to 3.6 cm. The exact  point of obstruction is not identified, but the distal jejunum and ileum are decompressed. 4. Moderate retained stool throughout the colon. 5. Stable sequela of chronic calcific pancreatitis and pancreatic parenchymal atrophy. No evidence of acute pancreatitis. 6. Right lower lobe bronchial wall thickening with areas of mucoid impaction, which could be sequela of aspiration or infection. 7. Aortic Atherosclerosis (ICD10-I70.0) and Emphysema (ICD10-J43.9). Electronically Signed   By: Ozell Daring M.D.   On: 03/15/2024 19:04   DG Abd 1 View Result Date: 03/15/2024 CLINICAL DATA:  [id number] Small bowel obstruction (HCC) 881155. EXAM: ABDOMEN - 1 VIEW COMPARISON:  CT scan abdomen and pelvis from 09/04/2023. FINDINGS: There is gaseous dilatation of several small bowel loops overlying the central abdomen, which is disproportionate to the degree of distention of the colon, suggesting small bowel obstruction. If not already performed, CT of the abdomen or small bowel  follow through is recommended for further evaluation. No evidence of pneumoperitoneum, within the limitations of a supine film. No acute osseous abnormalities. The soft tissues are within normal limits. Surgical changes, devices, tubes and lines: None. IMPRESSION: *Findings favor small bowel obstruction. If not already performed, CT of the abdomen or small bowel follow through is recommended for further evaluation. Electronically Signed   By: Ree Molt M.D.   On: 03/15/2024 17:00   DG Chest Port 1 View Result Date: 03/15/2024 CLINICAL DATA:  New onset shortness of breath. EXAM: PORTABLE CHEST 1 VIEW COMPARISON:  03/14/2024. FINDINGS: Bilateral lungs appear hyperlucent with coarse bronchovascular markings, in keeping with COPD. Re-demonstration of left retrocardiac airspace opacity obscuring the left hemidiaphragm, descending thoracic aorta and blunting the left lateral costophrenic angle, suggesting combination of left lung atelectasis and/or  consolidation with pleural effusion. No significant interval change. Mild heterogeneous opacities again seen overlying the right mid lung zone, laterally, which may represent underlying pneumonitis versus atelectasis. No interval change. Otherwise, no acute consolidation or lung collapse. Right lateral costophrenic angle is clear. Stable cardio-mediastinal silhouette. No acute osseous abnormalities. The soft tissues are within normal limits. IMPRESSION: *No significant interval change since the prior study. *Persistent left retrocardiac and right mid lung zone opacities, as described above. Electronically Signed   By: Ree Molt M.D.   On: 03/15/2024 16:31   CT Maxillofacial Wo Contrast Result Date: 03/14/2024 CLINICAL DATA:  89 year old female status post fall, respiratory distress. Skin tears. EXAM: CT MAXILLOFACIAL WITHOUT CONTRAST TECHNIQUE: Multidetector CT imaging of the maxillofacial structures was performed. Multiplanar CT image reconstructions were also generated. RADIATION DOSE REDUCTION: This exam was performed according to the departmental dose-optimization program which includes automated exposure control, adjustment of the mA and/or kV according to patient size and/or use of iterative reconstruction technique. COMPARISON:  CT head and cervical spine today.  Face CT 10/22/2017. FINDINGS: Osseous: Mandible intact and normally located. Absent maxillary dentition. Bilateral maxilla, zygoma, pterygoid, nasal bones appear stable and intact. Central skull base appears intact. Orbits: Intact orbital walls. Chronic postoperative changes to the globes. Orbits soft tissues otherwise normal. Sinuses: Advanced chronic left sphenoid sinusitis with stable mucoperiosteal thickening. Other paranasal sinuses, tympanic cavities are clear. Chronic bilateral mastoid effusions and sclerosis, stable. Soft tissues: Mild motion artifact in the deep soft tissue spaces of the face. Negative visible noncontrast thyroid ,  larynx, pharynx, parapharyngeal spaces, retropharyngeal space, sublingual space, submandibular, masticator and parotid spaces. Calcified cervical carotid atherosclerosis. Limited intracranial: Stable to that reported separately. IMPRESSION: 1. No acute traumatic injury identified in the Face. 2. Stable chronic left sphenoid sinusitis, chronic bilateral mastoid inflammation. Electronically Signed   By: VEAR Hurst M.D.   On: 03/14/2024 09:18   CT Cervical Spine Wo Contrast Result Date: 03/14/2024 CLINICAL DATA:  89 year old female status post fall, respiratory distress. Skin tears. EXAM: CT CERVICAL SPINE WITHOUT CONTRAST TECHNIQUE: Multidetector CT imaging of the cervical spine was performed without intravenous contrast. Multiplanar CT image reconstructions were also generated. RADIATION DOSE REDUCTION: This exam was performed according to the departmental dose-optimization program which includes automated exposure control, adjustment of the mA and/or kV according to patient size and/or use of iterative reconstruction technique. COMPARISON:  Head and face CT today. FINDINGS: Alignment: Straightening of cervical lordosis. Cervicothoracic junction alignment is within normal limits. Bilateral posterior element alignment is within normal limits. Skull base and vertebrae: Bone mineralization is within normal limits for age. Visualized skull base is intact. No atlanto-occipital dissociation. C1 and C2 appear intact and aligned.  No acute osseous abnormality identified. Soft tissues and spinal canal: No prevertebral fluid or swelling. No visible canal hematoma. Motion artifact in the noncontrast neck. Calcified carotid atherosclerosis. Disc levels: Degenerative cervical vertebral ankylosis C3-C4. Advanced facet arthropathy, chronic disc and endplate degeneration at the adjacent segments. But no significant spinal stenosis by CT. Upper chest: Moderate to large layering left pleural effusion in the left lung apex, simple fluid  density favoring transudate. Underlying emphysema, moderate to severe. Negative visible noncontrast thoracic inlet soft tissues. IMPRESSION: 1. No acute traumatic injury identified in the cervical spine. 2. Cervical spine degeneration superimposed on degenerative C3-C4 ankylosis, but no significant spinal stenosis by CT. 3. Emphysema (ICD10-J43.9). Moderate or possibly large layering Left Pleural Effusion suspected to be transudate. Electronically Signed   By: VEAR Hurst M.D.   On: 03/14/2024 09:16   CT Head Wo Contrast Result Date: 03/14/2024 CLINICAL DATA:  89 year old female status post fall, respiratory distress. Skin tears. EXAM: CT HEAD WITHOUT CONTRAST TECHNIQUE: Contiguous axial images were obtained from the base of the skull through the vertex without intravenous contrast. RADIATION DOSE REDUCTION: This exam was performed according to the departmental dose-optimization program which includes automated exposure control, adjustment of the mA and/or kV according to patient size and/or use of iterative reconstruction technique. COMPARISON:  Face and cervical spine CT today reported separately. Head and face CT 10/22/2017. FINDINGS: Brain: Cerebral volume not significantly changed since 2019. No midline shift, ventriculomegaly, mass effect, evidence of mass lesion, intracranial hemorrhage or evidence of cortically based acute infarction. Gray-white differentiation remains normal for age. Vascular: No suspicious intracranial vascular hyperdensity. Calcified atherosclerosis at the skull base. Stable punctate basal ganglia vascular calcifications. Skull: Stable. No acute osseous abnormality identified. Face reported separately. Sinuses/Orbits: Chronic left sphenoid sinusitis with mucoperiosteal thickening appears stable. Other paranasal sinuses well aerated. Mild chronic mastoid effusions and sclerosis stable since 2019. Tympanic cavities well aerated. Other: No orbit or scalp soft tissue injury identified.  IMPRESSION: 1. No  acute traumatic injury identified. 2. Stable and negative for age noncontrast CT appearance of the brain. 3. Face CT reported separately. Electronically Signed   By: VEAR Hurst M.D.   On: 03/14/2024 09:13   DG Elbow 2 Views Right Result Date: 03/14/2024 EXAM: 1 or 2 VIEW(S) XRAY OF THE RIGHT ELBOW COMPARISON: 02/01/2023 CLINICAL HISTORY: Fall. EMS reports to have found patient sitting on the toilet with skin tears present to her right elbow and knee. Upon arrival, patient was in respiratory distress with bilateral expiratory wheezing and O2 sats at 85% on RA. History of COPD. FINDINGS: BONES AND JOINTS: No acute fracture. No focal osseous lesion. No joint dislocation. SOFT TISSUES: The soft tissues are unremarkable. IMPRESSION: 1. No acute abnormality. Electronically signed by: Waddell Calk MD 03/14/2024 08:47 AM EDT RP Workstation: [id number]   DG Knee 2 Views Right Result Date: 03/14/2024 EXAM: 1 or 2 VIEW(S) XRAY OF THE RIGHT KNEE 03/14/2024 08:33:00 AM COMPARISON: 02/01/2023 CLINICAL HISTORY: Fall. EMS reports to have found patient sitting on the toilet with skin tears present to her right elbow and knee. Upon arrival, patient was in respiratory distress with bilateral expiratory wheezing and O2 sats at 85% on RA. History of COPD. FINDINGS: BONES AND JOINTS: No acute fracture. No focal osseous lesion. No joint dislocation. No significant joint effusion. No significant degenerative changes. SOFT TISSUES: New prepatellar soft tissue thickening measures 1.2 cm in thickness. Soft tissue irregularity with mild subcutaneous superficial soft tissue gas overlying the lateral knee compatible with soft tissue  injury. IMPRESSION: 1. No acute fracture or dislocation. 2. Soft tissue irregularity with mild subcutaneous superficial soft tissue gas overlying the lateral knee, compatible with soft tissue injury. 3. New prepatellar soft tissue thickening measuring 1.2 cm in thickness . Electronically signed  by: Waddell Calk MD 03/14/2024 08:46 AM EDT RP Workstation: [id number]   DG Hip Unilat With Pelvis 2-3 Views Left Result Date: 03/14/2024 EXAM: 2 or 3 VIEW(S) XRAY OF THE LEFT HIP 03/14/2024 08:33:00 AM COMPARISON: None available. CLINICAL HISTORY: Fall. EMS reports to have found patient sitting on the toilet with skin tears present to her right elbow and knee. Upon arrival, patient was in respiratory distress with bilateral expiratory wheezing and O2 sats at 85% on RA. History of COPD. FINDINGS: BONES AND JOINTS: No acute fracture or focal osseous lesion. The hip joint is maintained. No significant degenerative changes. SOFT TISSUES: The soft tissues are unremarkable. VASCULATURE: Vascular calcifications identified. Right common iliac artery stent. IMPRESSION: 1. No acute fracture or dislocation of the left hip or visualized pelvis. Electronically signed by: Waddell Calk MD 03/14/2024 08:44 AM EDT RP Workstation: [id number]   DG Chest Port 1 View Result Date: 03/14/2024 EXAM: 1 VIEW XRAY OF THE CHEST 03/14/2024 07:12:17 AM COMPARISON: 07/25/2022 CLINICAL HISTORY: SOB, respiratory distress. EMS reports they were called for a fall. Upon arrival they report to have found patient sitting on the toilet with skin tears present to her right elbow and knee. Upon arrival patient was in respiratory distress with bilateral expiratory wheezing and O2 sats at 85% on RA. History of COPD. FINDINGS: LUNGS AND PLEURA: New small to moderate size left pleural effusion with overlying atelectasis and retrocardiac opacification. New patchy opacities noted within the central right upper lobe. Emphysema with diffuse bronchial wall thickening. HEART AND MEDIASTINUM: No acute abnormality of the cardiac and mediastinal silhouettes. Aortic atherosclerotic calcification. BONES AND SOFT TISSUES: Remote left lower anterior rib fractures. IMPRESSION: 1. New small to moderate size left pleural effusion with overlying atelectasis and  retrocardiac opacification. 2. New small patchy opacities within the central right upper lobe. 3. Emphysema with diffuse bronchial wall thickening. 4. Aortic atherosclerotic calcification. 5. Remote left lower anterior rib fractures. Electronically signed by: Waddell Calk MD 03/14/2024 07:31 AM EDT RP Workstation: [id number]    Assessment/Plan  Essential hypertension, benign blood pressure control important in reducing the progression of atherosclerotic disease. On appropriate oral medications.   COPD with acute exacerbation (HCC) On oxygen .  Poor candidate for general anesthesia or intubation at any point.  HYPERCHOLESTEROLEMIA lipid control important in reducing the progression of atherosclerotic disease. Continue statin therapy   Critical limb ischemia of left lower extremity (HCC) Her perfusion has been restored.  She does have prominent lower extremity swelling which is likely exacerbated by some reperfusion edema.  She has previously taken Lasix  with good results and I we will give her a short supply of that now, but have told her I would not provide this long-term and she should discuss with her primary care physician.  Will bring her back in a few weeks to reassess her legs as well as check for perfusion with noninvasive studies.  Continue current medical regimen.  Swelling of limb Quite prominent.  Short supply of Lasix .  Elevation and compression as well as increasing her activity as tolerated.  If she develops skin breakdown or weepage, Unna boots will be placed.    [name], MD  04/07/2024 4:21 PM    This note was created with Dragon medical transcription system.  Any errors from dictation are purely unintentional

## 2024-04-07 NOTE — Assessment & Plan Note (Signed)
 Her perfusion has been restored.  She does have prominent lower extremity swelling which is likely exacerbated by some reperfusion edema.  She has previously taken Lasix  with good results and I we will give her a short supply of that now, but have told her I would not provide this long-term and she should discuss with her primary care physician.  Will bring her back in a few weeks to reassess her legs as well as check for perfusion with noninvasive studies.  Continue current medical regimen.

## 2024-04-07 NOTE — Assessment & Plan Note (Signed)
 On oxygen .  Poor candidate for general anesthesia or intubation at any point.

## 2024-04-07 NOTE — Assessment & Plan Note (Signed)
 lipid control important in reducing the progression of atherosclerotic disease. Continue statin therapy

## 2024-04-07 NOTE — Progress Notes (Signed)
 [facility] Consultation     Date: 04/07/2024,   MRN# [id number] SHARNIKA BINNEY 20-Feb-1934      Synopsis: Nafeesa Dils was first seen by the [facility] pulmonary clinic in the summer of 2013. She has COPD. Simple spirometry performed in clinic showed an FEV1 of 45% predicted with clear obstruction.  She smoked 2 pack/day x 40 years, quit  17 years ago according to patient. She is a frequent exacerbation phenotype. She uses 2 L of oxygen  continuously with exertion and at night   lling, mild erythema CC  Follow-up assessment for COPD Follow-up assessment for severe respiratory insufficiency Follow-up assessment for chronic hypoxic respiratory failure Follow-up assessment for chronic bronchitis and chronic bronchiectasis  HPI Recent admission for acute limb ischemia with small bowel obstruction  No evidence of heart failure at this time No evidence or signs of infection at this time No respiratory distress No fevers, chills, nausea, vomiting, diarrhea No evidence of lower extremity edema No evidence hemoptysis   We had decreased Breztri  inhaler to 2 puffs daily however with increased sputum production will prescribe Breztri  inhaler 2 puffs twice daily   Diagnosed with COVID July 2022 Declining ever since then S/p VASC procedure  Chronic Hypoxic resp failure due to COPD -Patient benefits from oxygen  therapy 2L [place]  -recommend using oxygen  as prescribed -patient needs this for survival Oxygen  saturations goal therapy is 88 to 95% Patient was in the room talking to me without oxygen  and her O2 sat was 92% Patient will need oxygen  therapy with exertion and at night    Severe respiratory insufficiency Debilitating shortness of breath Chronic shortness of breath and dyspnea exertion Stable over the last several years We started PDE 4 inhibitor inhaler therapy last office visit However this was very expensive and did not start nebulized therapy We  will talk about Daliresp therapy at next office visit   Patient diagnosed with bladder cancer sees urology   CT chest confirms bilateral bronchiectasis October 14, 2020 Assessment of chronic bronchitis and chronic bronchiectasis Patient has excessive mucus production despite flutter valve She does flutter valve 10 x 10 times a day, despite flutter valve patient has extensive mucus production Patient has chronic productive cough more than 6 months  Patient started Smart vest therapy  Her symptoms improved significantly and she is very happy Less short of breath Significant mucus production has decreased with usage No evidence of infection Smart vest regimen includes the following Patient does 20 minutes twice daily Intensity at 20,25 and 35% respectively Patient tolerating very well     o signs of CHF or lower ext swelling at t ssues, has chCurrent Medication:   Current Outpatient Medications:    Acetylcysteine (NAC 600 PO), Take by mouth., Disp: , Rfl:    albuterol  (PROVENTIL ) (2.5 MG/3ML) 0.083% nebulizer solution, INHALE THE CONTENTS OF 1 VIAL VIA NEBULIZER EVERY 4 HOURS AS NEEDED FOR WHEEZING OR SHORTNESS OF BREATH., Disp: 450 mL, Rfl: 10   albuterol  (VENTOLIN  HFA) 108 (90 Base) MCG/ACT inhaler, Inhale 2 puffs into the lungs every 6 (six) hours as needed for wheezing or shortness of breath., Disp: 8 g, Rfl: 6   ascorbic acid (VITAMIN C) 1000 MG tablet, Take 1,000 mg by mouth daily. , Disp: , Rfl:    aspirin  EC 81 MG tablet, Take 1 tablet (81 mg total) by mouth daily. Swallow whole., Disp: 150 tablet, Rfl: 2   atorvastatin  (LIPITOR) 10 MG tablet, TAKE 1 TABLET EVERY DAY, Disp: 90 tablet,  Rfl: 3   Azelastine  HCl 137 MCG/SPRAY SOLN, Place 2 sprays into both nostrils 2 (two) times daily., Disp: 100 mL, Rfl: 5   budesonide -glycopyrrolate -formoterol  (BREZTRI  AEROSPHERE) 160-9-4.8 MCG/ACT AERO inhaler, Inhale 2 puffs into the lungs in the morning and at bedtime., Disp: , Rfl:     budesonide -glycopyrrolate -formoterol  (BREZTRI  AEROSPHERE) 160-9-4.8 MCG/ACT AERO inhaler, Inhale 2 puffs into the lungs 2 (two) times daily., Disp: 3 each, Rfl: 3   Cholecalciferol  (VITAMIN D ) 125 MCG (5000 UT) CAPS, Take 5,000 Units by mouth daily., Disp: , Rfl:    clopidogrel  (PLAVIX ) 75 MG tablet, TAKE 1 TABLET EVERY DAY, Disp: 90 tablet, Rfl: 3   Coenzyme Q10 (COQ10) 200 MG CAPS, Take by mouth., Disp: , Rfl:    collagenase  (SANTYL ) 250 UNIT/GM ointment, Apply 1 Application topically daily., Disp: 15 g, Rfl: 0   guaiFENesin  (MUCINEX ) 600 MG 12 hr tablet, Take 600 mg by mouth 2 (two) times daily., Disp: , Rfl:    melatonin 5 MG TABS, Take 1 tablet (5 mg total) by mouth at bedtime., Disp: 30 tablet, Rfl: 0   Multiple Vitamins-Minerals (PRESERVISION AREDS) TABS, Take 1 tablet by mouth 2 (two) times daily., Disp: , Rfl:    OXYGEN , Inhale 4 L into the lungs daily in the afternoon., Disp: , Rfl:    pantoprazole  (PROTONIX ) 40 MG tablet, Take 1 tablet (40 mg total) by mouth daily., Disp: 14 tablet, Rfl: 0   Pari LC Sprint Nebulizer Set MISC, , Disp: , Rfl:    polyethylene glycol (MIRALAX  / GLYCOLAX ) 17 g packet, Take 17 g by mouth daily., Disp: 14 each, Rfl: 0   senna-docusate (SENOKOT-S) 8.6-50 MG tablet, Take 1 tablet by mouth 2 (two) times daily as needed for mild constipation., Disp: 60 tablet, Rfl: 0   traZODone  (DESYREL ) 50 MG tablet, Take 0.5-1 tablets (25-50 mg total) by mouth at bedtime as needed for sleep., Disp: 30 tablet, Rfl: 3   verapamil  (VERELAN  PM) 360 MG 24 hr capsule, Take 360 mg by mouth daily., Disp: , Rfl:    Vitamin E 180 MG CAPS, Take 180 mg by mouth daily., Disp: , Rfl:    Wound Dressings (HYDROFERA BLUE 4X4) PADS, Apply to knee ulcer., Disp: 5 each, Rfl: 0  Immunization History  Administered Date(s) Administered   Fluad Quad(high Dose 65+) 04/09/2019, 04/27/2020, 04/28/2021, 05/08/2022   Fluad Trivalent(High Dose 65+) 05/01/2023   H1N1 10/04/2008   Influenza Split  04/21/2011, 05/12/2012   Influenza Whole 05/27/2007, 05/08/2008, 05/04/2009, 04/26/2010   Influenza, High Dose Seasonal PF 05/01/2023   Influenza,inj,Quad PF,6+ Mos 05/06/2013, 05/06/2014, 04/21/2015, 05/11/2016, 04/17/2017, 05/01/2018   Pneumococcal Conjugate-13 09/21/2014   Pneumococcal Polysaccharide-23 03/13/2006, 06/28/2011   Td 03/13/2006   Tdap 10/04/2016   Zoster Recombinant(Shingrix) 11/08/2020, 01/19/2021   Zoster, Live 09/05/2010     ALLERGIES   Patient has no known allergies.  There were no vitals taken for this visit.    Physical Examination:   General Appearance: No distress  EYES PERRLA, EOM intact.   NECK Supple, No JVD Pulmonary: normal breath sounds, No wheezing.  CardiovascularNormal S1,S2.  No m/r/g.   Abdomen: Benign, Soft, non-tender. Neurology UE/LE 5/5 strength, no focal deficits Ext pulses intact, cap refill intact ALL OTHER ROS ARE NEGATIVE       ASSESSMENT/PLAN   89 year old pleasant white female seen today for follow-up assessment for COPD Gold stage D end-stage severe COPD with chronic hypoxic respiratory failure CT findings suggestion of severe emphysema and bronchiectasis with right lower lobe pleural-based nodule  with excessive daytime sleepiness findings may sugge get are you st underlying sleep apnea but does not want to get any further sleep apnea testing nor does she want any further imaging of her lung nodule with CT scans Patient with chronic mucus production with chronic bronchiectasis chronic bronchitis  Assessment & Plan Chronic obstructive pulmonary disease, unspecified COPD type (HCC) COPD Gold stage D end-stage severe COPD Patient currently on Breztri  inhaler therapy Plan to increase Breztri  inhaler 2 puffs twice daily Rinse mouth after use We will plan to discuss Daliresp therapy at next office visit   Chronic hypoxemic respiratory failure (HCC) Chronic Hypoxic resp failure due to COPD -Patient benefits from oxygen   therapy 2L [place]  -recommend using oxygen  as prescribed -patient needs this for survival   Bronchiectasis without complication (HCC) Chronic bronchitis chronic bronchiectasis patient is clearly benefiting from chest physiotherapy Mucus production has diminished Patient using oxygen  only with exertion Patient does 20 minutes twice daily Intensity at 20,25 and 35% respectively Patient tolerating very well Mucopurulent chronic bronchitis (HCC) Chronic bronchitis chronic bronchiectasis patient is clearly benefiting from chest physiotherapy Mucus production has diminished Patient using oxygen  only with exertion Patient does 20 minutes twice daily Intensity at 20,25 and 35% respectively Patient tolerating very well Respiratory insufficiency Severe respiratory insufficiency Continue to use oxygen  as prescribed Continue supportive care Avoid infections Keep up-to-date on immunizations Abnormal CT of the chest CT chest ABNORMAL Right pleural-based nodule based on CT scan findings Patient does not want repeat CT chest to assess interval changes She does not want to pursue any further diagnostic testing She is happy with her life and does not want any type of work-up for this nodule We addressed repeating CT scans at this time however patient does not want any  further intervention or work-up Acute lower limb ischemia Acute on chronic critical limb ischemia left lower extremity Continue ASA 81 mg and Plavix  75 mg p.o. daily S/p Heparin  IV infusion, on 8/4 started Aggrastat  infusion after angiogram as per vascular surgery till 8/5 Vascular surgery consulted s/p angioplasty and stent inserted in left SFA, and left profunda femoris artery done on 8/4 8/5 continue DAPT  Pleural effusion 8/6 s/p left thoracentesis 1.1 L fluid was tapped by IR Fluid studies consistent with partially exudative by LDH criteria.  No evidence of infection. CXR postprocedure did not show any complications COPD  with acute exacerbation (HCC) Start Levaquin  750 mg daily for 7 days Prednisone  10 mg daily for 10 days          MEDICATION ADJUSTMENTS/LABS AND TESTS ORDERED: Continue smart vest therapy as prescribed Mucinex  as needed Nebulizers and flutter valve as prescribed Breztri  inhaler therapy 2 puffs twice daily Rinse mouth after use Continue oxygen  88 to 95% 2 L titrate as needed Avoid Allergens and Irritants Avoid secondhand smoke Avoid SICK contacts Recommend  Masking  when appropriate Recommend Keep up-to-date with vaccinations Levaquin  and prednisone  for COPD exacerbation Follow-up vascular surgery assessment   CURRENT MEDICATIONS REVIEWED AT LENGTH WITH PATIENT TODAY   Patient  satisfied with Plan of action and management. All questions answered   Follow up 3 months   I spent a total of 48 minutes dedicated to the care of this patient on the date of this encounter to include pre-visit review of records, face-to-face time with the patient discussing conditions above, post visit ordering of testing, clinical documentation with the electronic health record, making appropriate referrals as documented, and communicating necessary information to the patient's healthcare team.  The Patient requires high complexity decision making for assessment and support, frequent evaluation and titration of therapies, application of advanced monitoring technologies and extensive interpretation of multiple databases.  Patient satisfied with Plan of action and management. All questions answered    Nickolas Alm Cellar, M.D.  [facility]  Medical Director [facility] Medical Director [facility] Cardio-Pulmonary Department

## 2024-04-08 ENCOUNTER — Other Ambulatory Visit: Payer: Self-pay

## 2024-04-08 NOTE — Transitions of Care (Post Inpatient/ED Visit) (Addendum)
 Transition of Care week 3  Visit Note  04/08/2024  Name: Misty Ferguson MRN: [id number]          DOB: [birth year]  Situation: Patient enrolled in [facility] 30-day program. Visit completed with patient and spouse by telephone.   Background:   Initial Transition Care Management Follow-up Telephone Call    Past Medical History:  Diagnosis Date   AK (actinic keratosis) 12/08/2020   right pretibia inferior bx proven, LN2 01/10/21   Burping    Chronic airway obstruction, not elsewhere classified    Dyspnea    Dysrhythmia    Macular degeneration (senile) of retina, unspecified    Malignant neoplasm of urinary bladder (HCC) 03/2019   Partial bladder resection and Rad tx's.    Obstructive chronic bronchitis with exacerbation (HCC)    Osteoporosis, unspecified    Oxygen  deficiency    Personal history of peptic ulcer disease    SCC (squamous cell carcinoma) 12/08/2020   right pretibia superior, EDC 01/10/21   SCC (squamous cell carcinoma) 07/03/2022   left medial calf, clear 09/25/22   SCC (squamous cell carcinoma) 11/22/2022   right thigh   treated with ED&C   Squamous cell carcinoma in situ (SCCIS) 07/03/2022   right pretibia sccis needs treatment with fluorouracil    Tobacco use disorder    Unspecified essential hypertension    Unspecified glaucoma(365.9)     Assessment: Patient Reported Symptoms: Cognitive Cognitive Status: Alert and oriented to person, place, and time, Normal speech and language skills      Neurological Neurological Review of Symptoms: No symptoms reported    HEENT HEENT Symptoms Reported: No symptoms reported      Cardiovascular Cardiovascular Symptoms Reported: Swelling in legs or feet Cardiovascular Self-Management Outcome: 3 (uncertain) Cardiovascular Comment: Patient reports some sweeling to legs and feet. On lasix  x 7 days, using compression hose and raising legs.  Respiratory Respiratory Symptoms Reported: Productive cough, Shortness of breath Other  Respiratory Symptoms: Cough productive. Patient uses smart vest to assist with secretions. Additional Respiratory Details: Oxygen  @ liters continuous. Respiratory Management Strategies: Breathing exercise, Breathing techniques, Routine screening, Oxygen  therapy, Medication therapy Respiratory Self-Management Outcome: 3 (uncertain)  Endocrine Endocrine Symptoms Reported: No symptoms reported    Gastrointestinal Gastrointestinal Symptoms Reported: No symptoms reported      Genitourinary Genitourinary Symptoms Reported: No symptoms reported    Integumentary Other Integumentary Symptoms: Knee wounds are healing well per patient.  Wound clinic appointment next month. However home health nurse continues to evaluate wounds. No signs of infection.  Right hand and elbow healed Skin Management Strategies: Dressing changes, Routine screening Skin Self-Management Outcome: 4 (good)  Musculoskeletal Musculoskelatal Symptoms Reviewed: Weakness Additional Musculoskeletal Details: Home health active Musculoskeletal Management Strategies: Exercise Musculoskeletal Self-Management Outcome: 4 (good)      Psychosocial Psychosocial Symptoms Reported: No symptoms reported         There were no vitals filed for this visit.  Medications Reviewed Today     Reviewed by Sameerah Nachtigal, RN (Case Manager) on 04/08/24 at 1206  Med List Status: <None>   Medication Order Taking? Sig Documenting Provider Last Dose Status Informant  Acetylcysteine (NAC 600 PO) [id number] Yes Take by mouth. [provider]  Active Self, Spouse/Significant Other, Pharmacy Records  albuterol  (PROVENTIL ) (2.5 MG/3ML) 0.083% nebulizer solution [id number] Yes INHALE THE CONTENTS OF 1 VIAL VIA NEBULIZER EVERY 4 HOURS AS NEEDED FOR WHEEZING OR SHORTNESS OF BREATH. Kasa, Kurian, MD  Active Self, Spouse/Significant Other, Pharmacy Records  albuterol  (VENTOLIN  HFA) 108 [phone]  Base) MCG/ACT inhaler [id number] Yes Inhale 2 puffs into the lungs  every 6 (six) hours as needed for wheezing or shortness of breath. Kasa, Kurian, MD  Active Self, Spouse/Significant Other, Pharmacy Records  ascorbic acid (VITAMIN C) 1000 MG tablet [id number] Yes Take 1,000 mg by mouth daily.  [provider]  Active Self, Spouse/Significant Other, Pharmacy Records  aspirin  EC 81 MG tablet [id number] Yes Take 1 tablet (81 mg total) by mouth daily. Swallow whole. Marea Selinda RAMAN, MD  Active Self, Spouse/Significant Other, Pharmacy Records  atorvastatin  (LIPITOR) 10 MG tablet [id number] Yes TAKE 1 TABLET EVERY DAY Dew, Jason S, MD  Active Self, Spouse/Significant Other, Pharmacy Records  Azelastine  HCl 137 MCG/SPRAY SOLN [id number] Yes Place 2 sprays into both nostrils 2 (two) times daily. Kasa, Kurian, MD  Active Self, Spouse/Significant Other, Pharmacy Records  budesonide -glycopyrrolate -formoterol  (BREZTRI  AEROSPHERE) 160-9-4.8 MCG/ACT AERO inhaler [id number] Yes Inhale 2 puffs into the lungs in the morning and at bedtime. Kasa, Kurian, MD  Active Self, Spouse/Significant Other, Pharmacy Records  budesonide -glycopyrrolate -formoterol  (BREZTRI  AEROSPHERE) 160-9-4.8 MCG/ACT AERO inhaler [id number]  Inhale 2 puffs into the lungs 2 (two) times daily. Avelina Greig BRAVO, MD  Active Self, Spouse/Significant Other, Pharmacy Records  Cholecalciferol  (VITAMIN D ) 125 MCG (5000 UT) CAPS [id number] Yes Take 5,000 Units by mouth daily. [provider]  Active Self, Spouse/Significant Other, Pharmacy Records  clopidogrel  (PLAVIX ) 75 MG tablet [id number] Yes TAKE 1 TABLET EVERY DAY Dew, Jason S, MD  Active Self, Spouse/Significant Other, Pharmacy Records  Coenzyme Q10 (COQ10) 200 MG CAPS [id number] Yes Take by mouth. [provider]  Active Self, Spouse/Significant Other, Pharmacy Records  collagenase  (SANTYL ) 250 UNIT/GM ointment [id number] Yes Apply 1 Application topically daily. Avelina Greig BRAVO, MD  Active   furosemide  (LASIX ) 20 MG tablet [id number] Yes Take 1 tablet (20 mg  total) by mouth daily for 7 days. Marea Selinda RAMAN, MD  Active   guaiFENesin  (MUCINEX ) 600 MG 12 hr tablet [id number] Yes Take 600 mg by mouth 2 (two) times daily. [provider]  Active Spouse/Significant Other, Self, Pharmacy Records           Med Note RAYNE ARLEY HERO   Sat Mar 14, 2024  3:49 PM) PRN  levofloxacin  (LEVAQUIN ) 750 MG tablet [id number] Yes Take 1 tablet (750 mg total) by mouth daily. 7 days Kasa, Kurian, MD  Active   melatonin 5 MG TABS [id number] Yes Take 1 tablet (5 mg total) by mouth at bedtime. Laurita Pillion, MD  Active   Multiple Vitamins-Minerals (PRESERVISION AREDS) TABS [id number] Yes Take 1 tablet by mouth 2 (two) times daily. [provider]  Active Self, Spouse/Significant Other, Pharmacy Records  OXYGEN  [id number] Yes Inhale 4 L into the lungs daily in the afternoon. [provider]  Active Self, Spouse/Significant Other, Pharmacy Records  pantoprazole  (PROTONIX ) 40 MG tablet [id number]  Take 1 tablet (40 mg total) by mouth daily.  Patient not taking: Reported on 04/08/2024   Laurita Pillion, MD  Active   [facility] Sprint Nebulizer Set MISC [id number]   [provider]  Active Self, Spouse/Significant Other, Pharmacy Records  polyethylene glycol (MIRALAX  / GLYCOLAX ) 17 g packet [id number]  Take 17 g by mouth daily. Laurita Pillion, MD  Active   predniSONE  (DELTASONE ) 10 MG tablet [id number] Yes Take 4 tablets (40 mg total) by mouth daily with breakfast. 10 days Isaiah Scrivener, MD  Active   senna-docusate (SENOKOT-S) 8.6-50 MG tablet [id number] Yes Take 1 tablet by mouth 2 (two) times daily as  needed for mild constipation. Laurita Pillion, MD  Active   traMADol  (ULTRAM ) 50 MG tablet [id number] Yes Take 50 mg by mouth every 6 (six) hours as needed for moderate pain (pain score 4-6) or severe pain (pain score 7-10). [provider]  Active   traZODone  (DESYREL ) 50 MG tablet [id number] Yes Take 0.5-1 tablets (25-50 mg total) by mouth at bedtime as needed for  sleep. [name], Amy E, MD  Active   verapamil  (VERELAN  PM) 360 MG 24 hr capsule [id number] Yes Take 360 mg by mouth daily. [provider]  Active Self, Spouse/Significant Other, Pharmacy Records  Vitamin E 180 MG CAPS [id number] Yes Take 180 mg by mouth daily. [provider]  Active Self, Spouse/Significant Other, Pharmacy Records  Wound Dressings [facility] 4X4) PADS [id number] Yes Apply to knee ulcer. Avelina Greig BRAVO, MD  Active             Goals Addressed             This Visit's Progress    [facility] Transitions of Care (TOC) Care Plan       Problems:  Recent Hospitalization for treatment of COPD and pneumonia and small bowel obstruction Knowledge Deficit Related to Pneumonia/COPD and bowel obstruction Home Health Active RN and PT  04/08/24 Patient okay today.  Not so good last night some restlessness reported.  Vascular and Pulmonology appointments yesterday.  Antibiotic and prednisone  started for  COPD exacerbation by pulmonary. Continues to manage pain with tramadol .    Home health remains active with RN for assessment and wound care along with PT.   Reviewed COPD and wound management. She verbalized understanding.     Goal:  Over the next 30 days, the patient will not experience hospital readmission  Interventions:  Transitions of Care: Doctor Visits  - discussed the importance of doctor visits Reviewed Signs and symptoms of infection Oxygen  use- keeping oxygen  levels above 80. Encouraged patient to pace self with activity. Reviewed importance of medication compliance. Reviewed when to call for physician for increased shortness of breath, increased sputum, fatigue that is not normal, cough, fever or sick contacts.   COPD Interventions: Provided education about and advised patient to utilize infection prevention strategies to reduce risk of respiratory infection Provided instruction about proper use of medications used for management of COPD including  inhalers Provided written and verbal instructions on pursed lip breathing and utilized returned demonstration as teach back Use of home oxygen   Patient Self Care Activities:  Attend all scheduled provider appointments Call provider office for new concerns or questions  Notify RN Care Manager of TOC call rescheduling needs Participate in Transition of Care Program/Attend TOC scheduled calls Take medications as prescribed   do breathing exercises every day do breathing exercises at least 2 times each day Uses smart vest as ordered BIDx  Plan:  The patient has been provided with contact information for the care management team and has been advised to call with any health related questions or concerns.         Recommendation:   Continue Current Plan of Care  Follow Up Plan:   Telephone follow-up in 1 week  Alayzia Pavlock J. Zamir Staples RN, MSN [facility], [facility] RN Care Manager Direct Dial: [phone]  Fax: [phone] Website: [email or web address]

## 2024-04-08 NOTE — Patient Instructions (Signed)
 Visit Information  Thank you for taking time to visit with me today. Please don't hesitate to contact me if I can be of assistance to you before our next scheduled telephone appointment.  Our next appointment is by telephone on 04/15/24 at 1200 pm  Following is a copy of your care plan:   Goals Addressed             This Visit's Progress    VBCI Transitions of Care (TOC) Care Plan       Problems:  Recent Hospitalization for treatment of COPD and pneumonia and small bowel obstruction Knowledge Deficit Related to Pneumonia/COPD and bowel obstruction Home Health Active RN and PT  04/08/24 Patient okay today.  Not so good last night some restlessness reported.  Vascular and Pulmonology appointments yesterday.  Antibiotic and prednisone  started for  COPD exacerbation by pulmonary. Continues to manage pain with tramadol .    Home health remains active with RN for assessment and wound care along with PT.   Reviewed COPD and wound management. She verbalized understanding.     Goal:  Over the next 30 days, the patient will not experience hospital readmission  Interventions:  Transitions of Care: Doctor Visits  - discussed the importance of doctor visits Reviewed Signs and symptoms of infection Oxygen  use- keeping oxygen  levels above 80. Encouraged patient to pace self with activity. Reviewed importance of medication compliance. Reviewed when to call for physician for increased shortness of breath, increased sputum, fatigue that is not normal, cough, fever or sick contacts.   COPD Interventions: Provided education about and advised patient to utilize infection prevention strategies to reduce risk of respiratory infection Provided instruction about proper use of medications used for management of COPD including inhalers Provided written and verbal instructions on pursed lip breathing and utilized returned demonstration as teach back Use of home oxygen   Patient Self Care Activities:  Attend  all scheduled provider appointments Call provider office for new concerns or questions  Notify RN Care Manager of TOC call rescheduling needs Participate in Transition of Care Program/Attend TOC scheduled calls Take medications as prescribed   do breathing exercises every day do breathing exercises at least 2 times each day Uses smart vest as ordered BIDx  Plan:  The patient has been provided with contact information for the care management team and has been advised to call with any health related questions or concerns.         Patient verbalizes understanding of instructions and care plan provided today and agrees to view in MyChart. Active MyChart status and patient understanding of how to access instructions and care plan via MyChart confirmed with patient.     The patient has been provided with contact information for the care management team and has been advised to call with any health related questions or concerns.   Please call the care guide team at [phone] if you need to cancel or reschedule your appointment.   Please call the Suicide and Crisis Lifeline: 988 if you are experiencing a Mental Health or Behavioral Health Crisis or need someone to talk to.  Kellin Fifer J. Yareli Carthen RN, MSN [facility], [facility] RN Care Manager Direct Dial: [phone]  Fax: [phone] Website: [email or web address]

## 2024-04-09 DIAGNOSIS — J439 Emphysema, unspecified: Secondary | ICD-10-CM | POA: Diagnosis not present

## 2024-04-09 DIAGNOSIS — J441 Chronic obstructive pulmonary disease with (acute) exacerbation: Secondary | ICD-10-CM | POA: Diagnosis not present

## 2024-04-09 DIAGNOSIS — S81011D Laceration without foreign body, right knee, subsequent encounter: Secondary | ICD-10-CM | POA: Diagnosis not present

## 2024-04-09 DIAGNOSIS — J189 Pneumonia, unspecified organism: Secondary | ICD-10-CM | POA: Diagnosis not present

## 2024-04-09 DIAGNOSIS — M800AXD Age-related osteoporosis with current pathological fracture, other site, subsequent encounter for fracture with routine healing: Secondary | ICD-10-CM | POA: Diagnosis not present

## 2024-04-09 DIAGNOSIS — J470 Bronchiectasis with acute lower respiratory infection: Secondary | ICD-10-CM | POA: Diagnosis not present

## 2024-04-09 DIAGNOSIS — S51011D Laceration without foreign body of right elbow, subsequent encounter: Secondary | ICD-10-CM | POA: Diagnosis not present

## 2024-04-09 DIAGNOSIS — J9621 Acute and chronic respiratory failure with hypoxia: Secondary | ICD-10-CM | POA: Diagnosis not present

## 2024-04-09 DIAGNOSIS — J440 Chronic obstructive pulmonary disease with acute lower respiratory infection: Secondary | ICD-10-CM | POA: Diagnosis not present

## 2024-04-10 ENCOUNTER — Encounter (INDEPENDENT_AMBULATORY_CARE_PROVIDER_SITE_OTHER): Payer: Medicare HMO

## 2024-04-10 ENCOUNTER — Ambulatory Visit (INDEPENDENT_AMBULATORY_CARE_PROVIDER_SITE_OTHER): Payer: Medicare HMO | Admitting: Vascular Surgery

## 2024-04-10 DIAGNOSIS — J441 Chronic obstructive pulmonary disease with (acute) exacerbation: Secondary | ICD-10-CM | POA: Diagnosis not present

## 2024-04-10 DIAGNOSIS — J440 Chronic obstructive pulmonary disease with acute lower respiratory infection: Secondary | ICD-10-CM | POA: Diagnosis not present

## 2024-04-10 DIAGNOSIS — M800AXD Age-related osteoporosis with current pathological fracture, other site, subsequent encounter for fracture with routine healing: Secondary | ICD-10-CM | POA: Diagnosis not present

## 2024-04-10 DIAGNOSIS — S81011D Laceration without foreign body, right knee, subsequent encounter: Secondary | ICD-10-CM | POA: Diagnosis not present

## 2024-04-10 DIAGNOSIS — J9621 Acute and chronic respiratory failure with hypoxia: Secondary | ICD-10-CM | POA: Diagnosis not present

## 2024-04-10 DIAGNOSIS — J189 Pneumonia, unspecified organism: Secondary | ICD-10-CM | POA: Diagnosis not present

## 2024-04-10 DIAGNOSIS — J439 Emphysema, unspecified: Secondary | ICD-10-CM | POA: Diagnosis not present

## 2024-04-10 DIAGNOSIS — J470 Bronchiectasis with acute lower respiratory infection: Secondary | ICD-10-CM | POA: Diagnosis not present

## 2024-04-10 DIAGNOSIS — S51011D Laceration without foreign body of right elbow, subsequent encounter: Secondary | ICD-10-CM | POA: Diagnosis not present

## 2024-04-14 DIAGNOSIS — J470 Bronchiectasis with acute lower respiratory infection: Secondary | ICD-10-CM | POA: Diagnosis not present

## 2024-04-14 DIAGNOSIS — M800AXD Age-related osteoporosis with current pathological fracture, other site, subsequent encounter for fracture with routine healing: Secondary | ICD-10-CM | POA: Diagnosis not present

## 2024-04-14 DIAGNOSIS — J9621 Acute and chronic respiratory failure with hypoxia: Secondary | ICD-10-CM | POA: Diagnosis not present

## 2024-04-14 DIAGNOSIS — S81011D Laceration without foreign body, right knee, subsequent encounter: Secondary | ICD-10-CM | POA: Diagnosis not present

## 2024-04-14 DIAGNOSIS — J441 Chronic obstructive pulmonary disease with (acute) exacerbation: Secondary | ICD-10-CM | POA: Diagnosis not present

## 2024-04-14 DIAGNOSIS — J189 Pneumonia, unspecified organism: Secondary | ICD-10-CM | POA: Diagnosis not present

## 2024-04-14 DIAGNOSIS — S51011D Laceration without foreign body of right elbow, subsequent encounter: Secondary | ICD-10-CM | POA: Diagnosis not present

## 2024-04-14 DIAGNOSIS — J439 Emphysema, unspecified: Secondary | ICD-10-CM | POA: Diagnosis not present

## 2024-04-14 DIAGNOSIS — J440 Chronic obstructive pulmonary disease with acute lower respiratory infection: Secondary | ICD-10-CM | POA: Diagnosis not present

## 2024-04-15 ENCOUNTER — Other Ambulatory Visit: Payer: Self-pay

## 2024-04-15 NOTE — Patient Instructions (Signed)
 Visit Information  Thank you for taking time to visit with me today. Please don't hesitate to contact me if I can be of assistance to you before our next scheduled telephone appointment.  Our next appointment is by telephone on 04/22/24 at 1200 pm  Following is a copy of your care plan:   Goals Addressed             This Visit's Progress    VBCI Transitions of Care (TOC) Care Plan       Problems:  Recent Hospitalization for treatment of COPD and pneumonia and small bowel obstruction Knowledge Deficit Related to Pneumonia/COPD and bowel obstruction Home Health Active RN and PT  04/15/24 Patient reports doing good. She feels like she has turned the corner. She slept well  last night.  Currently remains on prednisone .   Continues to manage pain with tramadol .    Home health remains active with RN for assessment and wound care along with PT.   Reiterated COPD and wound management. She verbalized understanding.     Goal:  Over the next 30 days, the patient will not experience hospital readmission  Interventions:  Transitions of Care: Doctor Visits  - discussed the importance of doctor visits Reviewed Signs and symptoms of infection Oxygen  use- keeping oxygen  levels above 80. Encouraged patient to pace self with activity. Reviewed importance of medication compliance. Reviewed when to call for physician for increased shortness of breath, increased sputum, fatigue that is not normal, cough, fever or sick contacts.   COPD Interventions: Provided education about and advised patient to utilize infection prevention strategies to reduce risk of respiratory infection Provided instruction about proper use of medications used for management of COPD including inhalers Provided written and verbal instructions on pursed lip breathing and utilized returned demonstration as teach back Use of home oxygen   Patient Self Care Activities:  Attend all scheduled provider appointments Call provider office  for new concerns or questions  Notify RN Care Manager of TOC call rescheduling needs Participate in Transition of Care Program/Attend TOC scheduled calls Take medications as prescribed   do breathing exercises every day do breathing exercises at least 2 times each day Uses smart vest as ordered BIDx  Plan:  The patient has been provided with contact information for the care management team and has been advised to call with any health related questions or concerns.         Patient verbalizes understanding of instructions and care plan provided today and agrees to view in MyChart. Active MyChart status and patient understanding of how to access instructions and care plan via MyChart confirmed with patient.     The patient has been provided with contact information for the care management team and has been advised to call with any health related questions or concerns.   Please call the care guide team at [phone] if you need to cancel or reschedule your appointment.   Please call the Suicide and Crisis Lifeline: 988 if you are experiencing a Mental Health or Behavioral Health Crisis or need someone to talk to.  Mionna Advincula J. Daivd Fredericksen RN, MSN [facility], [facility] RN Care Manager Direct Dial: [phone]  Fax: [phone] Website: [email or web address]

## 2024-04-15 NOTE — Transitions of Care (Post Inpatient/ED Visit) (Signed)
 Transition of Care week 4  Visit Note  04/15/2024  Name: Misty Ferguson MRN: [id number]          DOB: May 24, 1934  Situation: Patient enrolled in [facility] 30-day program. Visit completed with patient by telephone.   Background:   Initial Transition Care Management Follow-up Telephone Call    Past Medical History:  Diagnosis Date   AK (actinic keratosis) 12/08/2020   right pretibia inferior bx proven, LN2 01/10/21   Burping    Chronic airway obstruction, not elsewhere classified    Dyspnea    Dysrhythmia    Macular degeneration (senile) of retina, unspecified    Malignant neoplasm of urinary bladder (HCC) 03/2019   Partial bladder resection and Rad tx's.    Obstructive chronic bronchitis with exacerbation (HCC)    Osteoporosis, unspecified    Oxygen  deficiency    Personal history of peptic ulcer disease    SCC (squamous cell carcinoma) 12/08/2020   right pretibia superior, EDC 01/10/21   SCC (squamous cell carcinoma) 07/03/2022   left medial calf, clear 09/25/22   SCC (squamous cell carcinoma) 11/22/2022   right thigh   treated with ED&C   Squamous cell carcinoma in situ (SCCIS) 07/03/2022   right pretibia sccis needs treatment with fluorouracil    Tobacco use disorder    Unspecified essential hypertension    Unspecified glaucoma(365.9)     Assessment: Patient Reported Symptoms: Cognitive Cognitive Status: Alert and oriented to person, place, and time, Normal speech and language skills      Neurological Neurological Review of Symptoms: No symptoms reported    HEENT HEENT Symptoms Reported: No symptoms reported      Cardiovascular Cardiovascular Symptoms Reported: Swelling in legs or feet Cardiovascular Comment: Patient reports swelling better.  Wearing compression hose and raising legs.  Respiratory Respiratory Symptoms Reported: Productive cough, Shortness of breath Additional Respiratory Details: Oxygen  @ 2 liters continuous. Respiratory Management Strategies:  Breathing exercise, Oxygen  therapy, Medication therapy, Routine screening Respiratory Self-Management Outcome: 4 (good)  Endocrine Endocrine Symptoms Reported: No symptoms reported    Gastrointestinal Gastrointestinal Symptoms Reported: No symptoms reported      Genitourinary Genitourinary Symptoms Reported: No symptoms reported    Integumentary Integumentary Symptoms Reported: Wound Other Integumentary Symptoms: Right knee wound continues to heal.  Wound clinic appointment upcoming.  Daily dressing changes continues. She reports sall bedsore to buttocks  Using zinc oxide with improvement to area.  Home health continues to assess. Skin Management Strategies: Dressing changes, Routine screening Skin Self-Management Outcome: 4 (good)  Musculoskeletal Musculoskelatal Symptoms Reviewed: Weakness Additional Musculoskeletal Details: Home health PT active Musculoskeletal Management Strategies: Exercise Musculoskeletal Self-Management Outcome: 4 (good)      Psychosocial Psychosocial Symptoms Reported: No symptoms reported         There were no vitals filed for this visit.  Medications Reviewed Today     Reviewed by Mounir Skipper, RN (Case Manager) on 04/15/24 at 1210  Med List Status: <None>   Medication Order Taking? Sig Documenting Provider Last Dose Status Informant  Acetylcysteine (NAC 600 PO) [id number] Yes Take by mouth. [provider]  Active Self, Spouse/Significant Other, Pharmacy Records  albuterol  (PROVENTIL ) (2.5 MG/3ML) 0.083% nebulizer solution [id number] Yes INHALE THE CONTENTS OF 1 VIAL VIA NEBULIZER EVERY 4 HOURS AS NEEDED FOR WHEEZING OR SHORTNESS OF BREATH. Kasa, Kurian, MD  Active Self, Spouse/Significant Other, Pharmacy Records  albuterol  (VENTOLIN  HFA) [phone] Base) MCG/ACT inhaler [id number] Yes Inhale 2 puffs into the lungs every 6 (six) hours as needed for wheezing  or shortness of breath. Kasa, Kurian, MD  Active Self, Spouse/Significant Other, Pharmacy  Records  ascorbic acid (VITAMIN C) 1000 MG tablet [id number] Yes Take 1,000 mg by mouth daily.  [provider]  Active Self, Spouse/Significant Other, Pharmacy Records  aspirin  EC 81 MG tablet [id number] Yes Take 1 tablet (81 mg total) by mouth daily. Swallow whole. Marea Selinda RAMAN, MD  Active Self, Spouse/Significant Other, Pharmacy Records  atorvastatin  (LIPITOR) 10 MG tablet [id number] Yes TAKE 1 TABLET EVERY DAY Dew, Jason S, MD  Active Self, Spouse/Significant Other, Pharmacy Records  Azelastine  HCl 137 MCG/SPRAY SOLN [id number] Yes Place 2 sprays into both nostrils 2 (two) times daily. Kasa, Kurian, MD  Active Self, Spouse/Significant Other, Pharmacy Records  budesonide -glycopyrrolate -formoterol  (BREZTRI  AEROSPHERE) 160-9-4.8 MCG/ACT AERO inhaler [id number] Yes Inhale 2 puffs into the lungs in the morning and at bedtime. Kasa, Kurian, MD  Active Self, Spouse/Significant Other, Pharmacy Records  budesonide -glycopyrrolate -formoterol  (BREZTRI  AEROSPHERE) 160-9-4.8 MCG/ACT AERO inhaler [id number]  Inhale 2 puffs into the lungs 2 (two) times daily. Avelina Greig BRAVO, MD  Active Self, Spouse/Significant Other, Pharmacy Records  Cholecalciferol  (VITAMIN D ) 125 MCG (5000 UT) CAPS [id number] Yes Take 5,000 Units by mouth daily. [provider]  Active Self, Spouse/Significant Other, Pharmacy Records  clopidogrel  (PLAVIX ) 75 MG tablet [id number] Yes TAKE 1 TABLET EVERY DAY Dew, Jason S, MD  Active Self, Spouse/Significant Other, Pharmacy Records  Coenzyme Q10 (COQ10) 200 MG CAPS [id number] Yes Take by mouth. [provider]  Active Self, Spouse/Significant Other, Pharmacy Records  collagenase  (SANTYL ) 250 UNIT/GM ointment [id number] Yes Apply 1 Application topically daily. Avelina Greig BRAVO, MD  Active   furosemide  (LASIX ) 20 MG tablet [id number] Yes Take 1 tablet (20 mg total) by mouth daily for 7 days. Marea Selinda RAMAN, MD  Active   guaiFENesin  (MUCINEX ) 600 MG 12 hr tablet [id number] Yes Take 600 mg by  mouth 2 (two) times daily. [provider]  Active Spouse/Significant Other, Self, Pharmacy Records           Med Note RAYNE ARLEY HERO   Sat Mar 14, 2024  3:49 PM) PRN  levofloxacin  (LEVAQUIN ) 750 MG tablet [id number] Yes Take 1 tablet (750 mg total) by mouth daily. 7 days Kasa, Kurian, MD  Active   melatonin 5 MG TABS [id number] Yes Take 1 tablet (5 mg total) by mouth at bedtime. Laurita Pillion, MD  Active   Multiple Vitamins-Minerals (PRESERVISION AREDS) TABS [id number] Yes Take 1 tablet by mouth 2 (two) times daily. [provider]  Active Self, Spouse/Significant Other, Pharmacy Records  OXYGEN  [id number] Yes Inhale 4 L into the lungs daily in the afternoon. [provider]  Active Self, Spouse/Significant Other, Pharmacy Records  pantoprazole  (PROTONIX ) 40 MG tablet [id number]  Take 1 tablet (40 mg total) by mouth daily.  Patient not taking: Reported on 04/15/2024   Laurita Pillion, MD  Active   [facility] Sprint Nebulizer Set MISC [id number] Yes  [provider]  Active Self, Spouse/Significant Other, Pharmacy Records  polyethylene glycol (MIRALAX  / GLYCOLAX ) 17 g packet [id number] Yes Take 17 g by mouth daily. Laurita Pillion, MD  Active   predniSONE  (DELTASONE ) 10 MG tablet [id number] Yes Take 4 tablets (40 mg total) by mouth daily with breakfast. 10 days Isaiah Scrivener, MD  Active   senna-docusate (SENOKOT-S) 8.6-50 MG tablet [id number] Yes Take 1 tablet by mouth 2 (two) times daily as needed for mild constipation. Laurita Pillion, MD  Active   traMADol  (ULTRAM ) 50 MG tablet [id number] Yes Take  50 mg by mouth every 6 (six) hours as needed for moderate pain (pain score 4-6) or severe pain (pain score 7-10). [provider]  Active   traZODone  (DESYREL ) 50 MG tablet [id number] Yes Take 0.5-1 tablets (25-50 mg total) by mouth at bedtime as needed for sleep. [name], Amy E, MD  Active   verapamil  (VERELAN  PM) 360 MG 24 hr capsule [id number] Yes Take 360 mg by mouth daily.  [provider]  Active Self, Spouse/Significant Other, Pharmacy Records  Vitamin E 180 MG CAPS [id number] Yes Take 180 mg by mouth daily. [provider]  Active Self, Spouse/Significant Other, Pharmacy Records  Wound Dressings [facility] 4X4) PADS [id number] Yes Apply to knee ulcer. Avelina Greig BRAVO, MD  Active             Goals Addressed             This Visit's Progress    VBCI Transitions of Care (TOC) Care Plan       Problems:  Recent Hospitalization for treatment of COPD and pneumonia and small bowel obstruction Knowledge Deficit Related to Pneumonia/COPD and bowel obstruction Home Health Active RN and PT  04/15/24 Patient reports doing good. She feels like she has turned the corner. She slept well  last night.  Currently remains on prednisone .   Continues to manage pain with tramadol .    Home health remains active with RN for assessment and wound care along with PT.   Reiterated COPD and wound management. She verbalized understanding.     Goal:  Over the next 30 days, the patient will not experience hospital readmission  Interventions:  Transitions of Care: Doctor Visits  - discussed the importance of doctor visits Reviewed Signs and symptoms of infection Oxygen  use- keeping oxygen  levels above 80. Encouraged patient to pace self with activity. Reviewed importance of medication compliance. Reviewed when to call for physician for increased shortness of breath, increased sputum, fatigue that is not normal, cough, fever or sick contacts.   COPD Interventions: Provided education about and advised patient to utilize infection prevention strategies to reduce risk of respiratory infection Provided instruction about proper use of medications used for management of COPD including inhalers Provided written and verbal instructions on pursed lip breathing and utilized returned demonstration as teach back Use of home oxygen   Patient Self Care Activities:   Attend all scheduled provider appointments Call provider office for new concerns or questions  Notify RN Care Manager of TOC call rescheduling needs Participate in Transition of Care Program/Attend TOC scheduled calls Take medications as prescribed   do breathing exercises every day do breathing exercises at least 2 times each day Uses smart vest as ordered BIDx  Plan:  The patient has been provided with contact information for the care management team and has been advised to call with any health related questions or concerns.         Recommendation:   Continue Current Plan of Care  Follow Up Plan:   Telephone follow-up in 1 week  Malone Vanblarcom J. Chalet Kerwin RN, MSN [facility], [facility] RN Care Manager Direct Dial: [phone]  Fax: [phone] Website: [email or web address]

## 2024-04-17 DIAGNOSIS — J439 Emphysema, unspecified: Secondary | ICD-10-CM | POA: Diagnosis not present

## 2024-04-17 DIAGNOSIS — J189 Pneumonia, unspecified organism: Secondary | ICD-10-CM | POA: Diagnosis not present

## 2024-04-17 DIAGNOSIS — J440 Chronic obstructive pulmonary disease with acute lower respiratory infection: Secondary | ICD-10-CM | POA: Diagnosis not present

## 2024-04-17 DIAGNOSIS — J441 Chronic obstructive pulmonary disease with (acute) exacerbation: Secondary | ICD-10-CM | POA: Diagnosis not present

## 2024-04-17 DIAGNOSIS — M800AXD Age-related osteoporosis with current pathological fracture, other site, subsequent encounter for fracture with routine healing: Secondary | ICD-10-CM | POA: Diagnosis not present

## 2024-04-17 DIAGNOSIS — S81011D Laceration without foreign body, right knee, subsequent encounter: Secondary | ICD-10-CM | POA: Diagnosis not present

## 2024-04-17 DIAGNOSIS — S51011D Laceration without foreign body of right elbow, subsequent encounter: Secondary | ICD-10-CM | POA: Diagnosis not present

## 2024-04-17 DIAGNOSIS — J470 Bronchiectasis with acute lower respiratory infection: Secondary | ICD-10-CM | POA: Diagnosis not present

## 2024-04-17 DIAGNOSIS — J9621 Acute and chronic respiratory failure with hypoxia: Secondary | ICD-10-CM | POA: Diagnosis not present

## 2024-04-19 DIAGNOSIS — J479 Bronchiectasis, uncomplicated: Secondary | ICD-10-CM | POA: Diagnosis not present

## 2024-04-21 ENCOUNTER — Encounter: Payer: Self-pay | Admitting: Dermatology

## 2024-04-21 ENCOUNTER — Ambulatory Visit: Admitting: Dermatology

## 2024-04-21 DIAGNOSIS — D692 Other nonthrombocytopenic purpura: Secondary | ICD-10-CM | POA: Diagnosis not present

## 2024-04-21 DIAGNOSIS — L98491 Non-pressure chronic ulcer of skin of other sites limited to breakdown of skin: Secondary | ICD-10-CM

## 2024-04-21 DIAGNOSIS — L814 Other melanin hyperpigmentation: Secondary | ICD-10-CM | POA: Diagnosis not present

## 2024-04-21 DIAGNOSIS — W908XXA Exposure to other nonionizing radiation, initial encounter: Secondary | ICD-10-CM | POA: Diagnosis not present

## 2024-04-21 DIAGNOSIS — Z85820 Personal history of malignant melanoma of skin: Secondary | ICD-10-CM | POA: Diagnosis not present

## 2024-04-21 DIAGNOSIS — L821 Other seborrheic keratosis: Secondary | ICD-10-CM | POA: Diagnosis not present

## 2024-04-21 DIAGNOSIS — L98411 Non-pressure chronic ulcer of buttock limited to breakdown of skin: Secondary | ICD-10-CM

## 2024-04-21 DIAGNOSIS — D229 Melanocytic nevi, unspecified: Secondary | ICD-10-CM | POA: Diagnosis not present

## 2024-04-21 DIAGNOSIS — I878 Other specified disorders of veins: Secondary | ICD-10-CM

## 2024-04-21 DIAGNOSIS — L578 Other skin changes due to chronic exposure to nonionizing radiation: Secondary | ICD-10-CM

## 2024-04-21 DIAGNOSIS — D1801 Hemangioma of skin and subcutaneous tissue: Secondary | ICD-10-CM

## 2024-04-21 DIAGNOSIS — I872 Venous insufficiency (chronic) (peripheral): Secondary | ICD-10-CM

## 2024-04-21 NOTE — Patient Instructions (Signed)

## 2024-04-21 NOTE — Progress Notes (Signed)
   Follow-Up Visit   Subjective  Misty Ferguson is a 89 y.o. female who presents for the following: 6 month recheck lower legs, hx SCC. Patient had angioplasty at left leg 03/16/24. Advises that her skin is tender to touch all over.   Patient accompanied by husband who contributes to history.   The following portions of the chart were reviewed this encounter and updated as appropriate: medications, allergies, medical history  Review of Systems:  No other skin or systemic complaints except as noted in HPI or Assessment and Plan.  Objective  Well appearing patient in no apparent distress; mood and affect are within normal limits.   A focused examination was performed of the following areas: Legs, arms, back, scalp  Relevant exam findings are noted in the Assessment and Plan.  buttocks Per patient. Not examined today.  Assessment & Plan   HISTORY OF SQUAMOUS CELL CARCINOMA OF THE SKIN - No evidence of recurrence today - No lymphadenopathy - Recommend regular full body skin exams - Recommend daily broad spectrum sunscreen SPF 30+ to sun-exposed areas, reapply every 2 hours as needed.  - Call if any new or changing lesions are noted between office visits  Purpura - Chronic; persistent and recurrent.  Treatable, but not curable. - Violaceous macules and patches at legs - Benign - Related to trauma, age, sun damage and/or use of blood thinners, chronic use of topical and/or oral steroids - Observe - Can use OTC arnica containing moisturizer such as Dermend Bruise Formula if desired - Call for worsening or other concerns  Chronic venous stasis of lower legs Exam: violaceous confluent edematous plaques of bilateral lower legs up to knees  Plan: Cautious use of compression, recommended checking with vascular surgeon to ensure compression will not compromise perfusion (follow up later this month), especially given recent critical limb ischemia of left leg. Use lowest compression  daily that is helpful and only when edema is above baseline   LENTIGINES, SEBORRHEIC KERATOSES, HEMANGIOMAS - Benign normal skin lesions - Benign-appearing - Call for any changes  MELANOCYTIC NEVI - Tan-brown and/or pink-flesh-colored symmetric macules and papules - Benign appearing on exam today - Observation - Call clinic for new or changing moles - Recommend daily use of broad spectrum spf 30+ sunscreen to sun-exposed areas.   ACTINIC DAMAGE - Chronic condition, secondary to cumulative UV/sun exposure - diffuse scaly erythematous macules with underlying dyspigmentation - Recommend daily broad spectrum sunscreen SPF 30+ to sun-exposed areas, reapply every 2 hours as needed.  - Staying in the shade or wearing long sleeves, sun glasses (UVA+UVB protection) and wide brim hats (4-inch brim around the entire circumference of the hat) are also recommended for sun protection.  - Call for new or changing lesions.    NON-PRESSURE CHRONIC ULCER OF SKIN OF OTHER SITES LIMITED TO BREAKDOWN OF SKIN (HCC) buttocks Recommend patient consult with wound care for recommendations. Has appt 05/06/24.  Currently using Manuka Honey, may continue with that or Medihoney daily.    SOLAR PURPURA (HCC)   VENOUS STASIS DERMATITIS OF BOTH LOWER EXTREMITIES   MULTIPLE BENIGN NEVI   SEBORRHEIC KERATOSES   ACTINIC ELASTOSIS   CHERRY ANGIOMA    Return in about 6 months (around 10/19/2024) for with Dr. Claudene recheck legs.  LILLETTE Lonell Drones, RMA, am acting as scribe for Boneta Claudene, MD .   Documentation: I have reviewed the above documentation for accuracy and completeness, and I agree with the above.  Boneta Claudene, MD

## 2024-04-22 ENCOUNTER — Other Ambulatory Visit: Payer: Self-pay

## 2024-04-22 DIAGNOSIS — J189 Pneumonia, unspecified organism: Secondary | ICD-10-CM | POA: Diagnosis not present

## 2024-04-22 DIAGNOSIS — J9621 Acute and chronic respiratory failure with hypoxia: Secondary | ICD-10-CM | POA: Diagnosis not present

## 2024-04-22 DIAGNOSIS — J441 Chronic obstructive pulmonary disease with (acute) exacerbation: Secondary | ICD-10-CM | POA: Diagnosis not present

## 2024-04-22 DIAGNOSIS — M800AXD Age-related osteoporosis with current pathological fracture, other site, subsequent encounter for fracture with routine healing: Secondary | ICD-10-CM | POA: Diagnosis not present

## 2024-04-22 DIAGNOSIS — J470 Bronchiectasis with acute lower respiratory infection: Secondary | ICD-10-CM | POA: Diagnosis not present

## 2024-04-22 DIAGNOSIS — S51011D Laceration without foreign body of right elbow, subsequent encounter: Secondary | ICD-10-CM | POA: Diagnosis not present

## 2024-04-22 DIAGNOSIS — J440 Chronic obstructive pulmonary disease with acute lower respiratory infection: Secondary | ICD-10-CM | POA: Diagnosis not present

## 2024-04-22 DIAGNOSIS — J439 Emphysema, unspecified: Secondary | ICD-10-CM | POA: Diagnosis not present

## 2024-04-22 DIAGNOSIS — S81011D Laceration without foreign body, right knee, subsequent encounter: Secondary | ICD-10-CM | POA: Diagnosis not present

## 2024-04-22 NOTE — Patient Instructions (Signed)
 Visit Information  Thank you for taking time to visit with me today. Please don't hesitate to contact me if I can be of assistance to you.   Following is a copy of your care plan:   Goals Addressed             This Visit's Progress    COMPLETED: VBCI Transitions of Care (TOC) Care Plan       Problems:  Recent Hospitalization for treatment of COPD and pneumonia and small bowel obstruction Knowledge Deficit Related to Pneumonia/COPD and bowel obstruction Home Health Active RN and PT  04/22/24 Patient reports doing good. She reports she feels better everyday.  Continues to manage pain with tramadol .    Home health remains active with RN for assessment and wound care along with PT.   Reviewed COPD and wound management. She verbalized understanding.  Discussed longitudinal nurse calls. Patient is agreeable.  Will complete referral   Goal:  Over the next 30 days, the patient will not experience hospital readmission  Interventions:  Transitions of Care: Doctor Visits  - discussed the importance of doctor visits Reviewed Signs and symptoms of infection Oxygen  use- keeping oxygen  levels above 80. Encouraged patient to pace self with activity. Reviewed importance of medication compliance. Reviewed when to call for physician for increased shortness of breath, increased sputum, fatigue that is not normal, cough, fever or sick contacts.   COPD Interventions: Provided education about and advised patient to utilize infection prevention strategies to reduce risk of respiratory infection Provided instruction about proper use of medications used for management of COPD including inhalers Provided written and verbal instructions on pursed lip breathing and utilized returned demonstration as teach back Use of home oxygen   Patient Self Care Activities:  Attend all scheduled provider appointments Call provider office for new concerns or questions  Notify RN Care Manager of TOC call rescheduling  needs Participate in Transition of Care Program/Attend TOC scheduled calls Take medications as prescribed   do breathing exercises every day do breathing exercises at least 2 times each day Uses smart vest as ordered BIDx  Plan:  The patient has been provided with contact information for the care management team and has been advised to call with any health related questions or concerns.         Patient verbalizes understanding of instructions and care plan provided today and agrees to view in MyChart. Active MyChart status and patient understanding of how to access instructions and care plan via MyChart confirmed with patient.     The patient has been provided with contact information for the care management team and has been advised to call with any health related questions or concerns.   Please call the care guide team at [phone] if you need to cancel or reschedule your appointment.   Please call the Suicide and Crisis Lifeline: 988 if you are experiencing a Mental Health or Behavioral Health Crisis or need someone to talk to.  Jaloni Sorber J. Mekala Winger RN, MSN [facility], [facility] RN Care Manager Direct Dial: [phone]  Fax: [phone] Website: [email or web address]

## 2024-04-22 NOTE — Transitions of Care (Post Inpatient/ED Visit) (Signed)
 Transition of Care Week 5  Visit Note  04/22/2024  Name: Misty Ferguson MRN: [id number]          DOB: Dec 25, 1933  Situation: Patient enrolled in [facility] 30-day program. Visit completed with patient by telephone.   Background:   Initial Transition Care Management Follow-up Telephone Call    Past Medical History:  Diagnosis Date   AK (actinic keratosis) 12/08/2020   right pretibia inferior bx proven, LN2 01/10/21   Burping    Chronic airway obstruction, not elsewhere classified    Dyspnea    Dysrhythmia    Macular degeneration (senile) of retina, unspecified    Malignant neoplasm of urinary bladder (HCC) 03/2019   Partial bladder resection and Rad tx's.    Obstructive chronic bronchitis with exacerbation (HCC)    Osteoporosis, unspecified    Oxygen  deficiency    Personal history of peptic ulcer disease    SCC (squamous cell carcinoma) 12/08/2020   right pretibia superior, EDC 01/10/21   SCC (squamous cell carcinoma) 07/03/2022   left medial calf, clear 09/25/22   SCC (squamous cell carcinoma) 11/22/2022   right thigh   treated with ED&C   Squamous cell carcinoma in situ (SCCIS) 07/03/2022   right pretibia sccis needs treatment with fluorouracil    Tobacco use disorder    Unspecified essential hypertension    Unspecified glaucoma(365.9)     Assessment: Patient Reported Symptoms: Cognitive Cognitive Status: Alert and oriented to person, place, and time, Normal speech and language skills      Neurological Neurological Review of Symptoms: No symptoms reported    HEENT HEENT Symptoms Reported: No symptoms reported      Cardiovascular Cardiovascular Symptoms Reported: Swelling in legs or feet Cardiovascular Comment: Wearing compression hose to manage swelling  Respiratory Respiratory Symptoms Reported: Productive cough, Shortness of breath Other Respiratory Symptoms: Severe COPD with oxygen  @ 2 liters continous.  Patient uses smart vest twice a day routinely. Respiratory  Management Strategies: Breathing exercise, Medication therapy, Oxygen  therapy, Routine screening Respiratory Self-Management Outcome: 4 (good)  Endocrine Endocrine Symptoms Reported: No symptoms reported    Gastrointestinal Gastrointestinal Symptoms Reported: No symptoms reported      Genitourinary Genitourinary Symptoms Reported: No symptoms reported    Integumentary Other Integumentary Symptoms: Right knee wound continues to heal per patient. Patient reports open areas to buttocks.  Patient reports using padding type dressing to area.  Home health continues to visit.  Patient has decided not to go to wound clinic due to wounds healing and home health continued assessment. Skin Management Strategies: Routine screening, Dressing changes Skin Self-Management Outcome: 4 (good)  Musculoskeletal Musculoskelatal Symptoms Reviewed: Weakness Additional Musculoskeletal Details: Remains active with PT Musculoskeletal Management Strategies: Exercise Musculoskeletal Self-Management Outcome: 4 (good)      Psychosocial Psychosocial Symptoms Reported: No symptoms reported         There were no vitals filed for this visit.  Medications Reviewed Today     Reviewed by Maysen Sudol, RN (Case Manager) on 04/22/24 at 1203  Med List Status: <None>   Medication Order Taking? Sig Documenting Provider Last Dose Status Informant  Acetylcysteine (NAC 600 PO) [id number] Yes Take by mouth. [provider]  Active Self, Spouse/Significant Other, Pharmacy Records  albuterol  (PROVENTIL ) (2.5 MG/3ML) 0.083% nebulizer solution [id number] Yes INHALE THE CONTENTS OF 1 VIAL VIA NEBULIZER EVERY 4 HOURS AS NEEDED FOR WHEEZING OR SHORTNESS OF BREATH. Kasa, Kurian, MD  Active Self, Spouse/Significant Other, Pharmacy Records  albuterol  (VENTOLIN  HFA) 108 [id number] Base) MCG/ACT inhaler [id number]  Yes Inhale 2 puffs into the lungs every 6 (six) hours as needed for wheezing or shortness of breath. Kasa, Kurian, MD  Active  Self, Spouse/Significant Other, Pharmacy Records  ascorbic acid (VITAMIN C) 1000 MG tablet [id number] Yes Take 1,000 mg by mouth daily.  [provider]  Active Self, Spouse/Significant Other, Pharmacy Records  aspirin  EC 81 MG tablet [id number] Yes Take 1 tablet (81 mg total) by mouth daily. Swallow whole. Marea Selinda RAMAN, MD  Active Self, Spouse/Significant Other, Pharmacy Records  atorvastatin  (LIPITOR) 10 MG tablet [id number] Yes TAKE 1 TABLET EVERY DAY Dew, Jason S, MD  Active Self, Spouse/Significant Other, Pharmacy Records  Azelastine  HCl 137 MCG/SPRAY SOLN [id number] Yes Place 2 sprays into both nostrils 2 (two) times daily. Kasa, Kurian, MD  Active Self, Spouse/Significant Other, Pharmacy Records  budesonide -glycopyrrolate -formoterol  (BREZTRI  AEROSPHERE) 160-9-4.8 MCG/ACT AERO inhaler [id number] Yes Inhale 2 puffs into the lungs in the morning and at bedtime. Kasa, Kurian, MD  Active Self, Spouse/Significant Other, Pharmacy Records  budesonide -glycopyrrolate -formoterol  (BREZTRI  AEROSPHERE) 160-9-4.8 MCG/ACT AERO inhaler [id number] Yes Inhale 2 puffs into the lungs 2 (two) times daily. Avelina Greig BRAVO, MD  Active Self, Spouse/Significant Other, Pharmacy Records  Cholecalciferol  (VITAMIN D ) 125 MCG (5000 UT) CAPS [id number] Yes Take 5,000 Units by mouth daily. [provider]  Active Self, Spouse/Significant Other, Pharmacy Records  clopidogrel  (PLAVIX ) 75 MG tablet [id number] Yes TAKE 1 TABLET EVERY DAY Dew, Jason S, MD  Active Self, Spouse/Significant Other, Pharmacy Records  Coenzyme Q10 (COQ10) 200 MG CAPS [id number] Yes Take by mouth. [provider]  Active Self, Spouse/Significant Other, Pharmacy Records  collagenase  (SANTYL ) 250 UNIT/GM ointment [id number] Yes Apply 1 Application topically daily. Avelina Greig BRAVO, MD  Active   furosemide  (LASIX ) 20 MG tablet [id number] Yes Take 1 tablet (20 mg total) by mouth daily for 7 days. Marea Selinda RAMAN, MD  Active   guaiFENesin  (MUCINEX ) 600  MG 12 hr tablet [id number] Yes Take 600 mg by mouth 2 (two) times daily. [provider]  Active Spouse/Significant Other, Self, Pharmacy Records           Med Note RAYNE ARLEY HERO   Sat Mar 14, 2024  3:49 PM) PRN  levofloxacin  (LEVAQUIN ) 750 MG tablet [id number] Yes Take 1 tablet (750 mg total) by mouth daily. 7 days Kasa, Kurian, MD  Active   melatonin 5 MG TABS [id number] Yes Take 1 tablet (5 mg total) by mouth at bedtime. Laurita Pillion, MD  Active   Multiple Vitamins-Minerals (PRESERVISION AREDS) TABS [id number] Yes Take 1 tablet by mouth 2 (two) times daily. [provider]  Active Self, Spouse/Significant Other, Pharmacy Records  OXYGEN  [id number] Yes Inhale 4 L into the lungs daily in the afternoon. [provider]  Active Self, Spouse/Significant Other, Pharmacy Records  pantoprazole  (PROTONIX ) 40 MG tablet [id number]  Take 1 tablet (40 mg total) by mouth daily.  Patient not taking: Reported on 04/22/2024   Laurita Pillion, MD  Active   [facility] Sprint Nebulizer Set MISC [id number] Yes  [provider]  Active Self, Spouse/Significant Other, Pharmacy Records  polyethylene glycol (MIRALAX  / GLYCOLAX ) 17 g packet [id number] Yes Take 17 g by mouth daily. Laurita Pillion, MD  Active   predniSONE  (DELTASONE ) 10 MG tablet [id number] Yes Take 4 tablets (40 mg total) by mouth daily with breakfast. 10 days Isaiah Scrivener, MD  Active   senna-docusate (SENOKOT-S) 8.6-50 MG tablet [id number] Yes Take 1 tablet by mouth 2 (two) times daily as needed for mild constipation.  Laurita Pillion, MD  Active   traMADol  (ULTRAM ) 50 MG tablet [id number] Yes Take 50 mg by mouth every 6 (six) hours as needed for moderate pain (pain score 4-6) or severe pain (pain score 7-10). [provider]  Active   traZODone  (DESYREL ) 50 MG tablet [id number] Yes Take 0.5-1 tablets (25-50 mg total) by mouth at bedtime as needed for sleep. [name], Amy E, MD  Active   verapamil  (VERELAN  PM) 360 MG 24 hr capsule  [id number] Yes Take 360 mg by mouth daily. [provider]  Active Self, Spouse/Significant Other, Pharmacy Records  Vitamin E 180 MG CAPS [id number] Yes Take 180 mg by mouth daily. [provider]  Active Self, Spouse/Significant Other, Pharmacy Records  Wound Dressings [facility] 4X4) PADS [id number] Yes Apply to knee ulcer. Avelina Greig BRAVO, MD  Active             Goals Addressed             This Visit's Progress    COMPLETED: [facility] Transitions of Care (TOC) Care Plan       Problems:  Recent Hospitalization for treatment of COPD and pneumonia and small bowel obstruction Knowledge Deficit Related to Pneumonia/COPD and bowel obstruction Home Health Active RN and PT  04/22/24 Patient reports doing good. She reports she feels better everyday.  Continues to manage pain with tramadol .    Home health remains active with RN for assessment and wound care along with PT.   Reviewed COPD and wound management. She verbalized understanding.  Discussed longitudinal nurse calls. Patient is agreeable.  Will complete referral   Goal:  Over the next 30 days, the patient will not experience hospital readmission  Interventions:  Transitions of Care: Doctor Visits  - discussed the importance of doctor visits Reviewed Signs and symptoms of infection Oxygen  use- keeping oxygen  levels above 80. Encouraged patient to pace self with activity. Reviewed importance of medication compliance. Reviewed when to call for physician for increased shortness of breath, increased sputum, fatigue that is not normal, cough, fever or sick contacts.   COPD Interventions: Provided education about and advised patient to utilize infection prevention strategies to reduce risk of respiratory infection Provided instruction about proper use of medications used for management of COPD including inhalers Provided written and verbal instructions on pursed lip breathing and utilized returned demonstration as  teach back Use of home oxygen   Patient Self Care Activities:  Attend all scheduled provider appointments Call provider office for new concerns or questions  Notify RN Care Manager of TOC call rescheduling needs Participate in Transition of Care Program/Attend TOC scheduled calls Take medications as prescribed   do breathing exercises every day do breathing exercises at least 2 times each day Uses smart vest as ordered BIDx  Plan:  The patient has been provided with contact information for the care management team and has been advised to call with any health related questions or concerns.         Recommendation:   Referral to: CCM Continue Current Plan of Care  Follow Up Plan:   Referral to RN Case Manager Closing From:  Transitions of Care Program  Kendahl Bumgardner DOROTHA Seeds RN, MSN [facility], [facility] RN Care Manager Direct Dial: [phone]  Fax: [phone] Website: [email or web address]

## 2024-04-23 DIAGNOSIS — J439 Emphysema, unspecified: Secondary | ICD-10-CM | POA: Diagnosis not present

## 2024-04-23 DIAGNOSIS — J470 Bronchiectasis with acute lower respiratory infection: Secondary | ICD-10-CM | POA: Diagnosis not present

## 2024-04-23 DIAGNOSIS — J9621 Acute and chronic respiratory failure with hypoxia: Secondary | ICD-10-CM | POA: Diagnosis not present

## 2024-04-23 DIAGNOSIS — J189 Pneumonia, unspecified organism: Secondary | ICD-10-CM | POA: Diagnosis not present

## 2024-04-23 DIAGNOSIS — M800AXD Age-related osteoporosis with current pathological fracture, other site, subsequent encounter for fracture with routine healing: Secondary | ICD-10-CM | POA: Diagnosis not present

## 2024-04-23 DIAGNOSIS — S81011D Laceration without foreign body, right knee, subsequent encounter: Secondary | ICD-10-CM | POA: Diagnosis not present

## 2024-04-23 DIAGNOSIS — J441 Chronic obstructive pulmonary disease with (acute) exacerbation: Secondary | ICD-10-CM | POA: Diagnosis not present

## 2024-04-23 DIAGNOSIS — S51011D Laceration without foreign body of right elbow, subsequent encounter: Secondary | ICD-10-CM | POA: Diagnosis not present

## 2024-04-23 DIAGNOSIS — J440 Chronic obstructive pulmonary disease with acute lower respiratory infection: Secondary | ICD-10-CM | POA: Diagnosis not present

## 2024-04-23 NOTE — Patient Outreach (Signed)
 RNCM received urgent referral - brief chart review conducted. Patient completed TOC program this week and urgently referred to [facility]. Spoke with patient, no immediate needs, scheduled for 05/01/24 at 2pm.

## 2024-04-28 ENCOUNTER — Ambulatory Visit (INDEPENDENT_AMBULATORY_CARE_PROVIDER_SITE_OTHER)

## 2024-04-28 ENCOUNTER — Encounter (INDEPENDENT_AMBULATORY_CARE_PROVIDER_SITE_OTHER): Payer: Self-pay | Admitting: Vascular Surgery

## 2024-04-28 ENCOUNTER — Ambulatory Visit (INDEPENDENT_AMBULATORY_CARE_PROVIDER_SITE_OTHER): Admitting: Vascular Surgery

## 2024-04-28 VITALS — BP 144/84 | HR 92 | Resp 18 | Wt 88.0 lb

## 2024-04-28 DIAGNOSIS — J441 Chronic obstructive pulmonary disease with (acute) exacerbation: Secondary | ICD-10-CM | POA: Diagnosis not present

## 2024-04-28 DIAGNOSIS — E7800 Pure hypercholesterolemia, unspecified: Secondary | ICD-10-CM

## 2024-04-28 DIAGNOSIS — I10 Essential (primary) hypertension: Secondary | ICD-10-CM | POA: Diagnosis not present

## 2024-04-28 DIAGNOSIS — I70222 Atherosclerosis of native arteries of extremities with rest pain, left leg: Secondary | ICD-10-CM

## 2024-04-28 NOTE — Assessment & Plan Note (Signed)
 Her ABIs today are 1.05 on the right and 1.11 on the left with triphasic waveforms consistent with successful revascularization of the left lower extremity and maintain perfusion currently.  Significant reperfusion swelling seems to be improving.  Overall doing well after revascularization.  Would continue current medications including aspirin , Plavix , and statin agent.  Follow-up in 3 to 4 months with noninvasive studies.

## 2024-04-28 NOTE — Progress Notes (Signed)
 MRN : [id number]  Misty Ferguson is a 89 y.o. ([birth year]) female who presents with chief complaint of  Chief Complaint  Patient presents with   Follow-up    Neuropathy complaints both feet  .  History of Present Illness: Patient returns today in follow up of patient returns in follow-up of her PAD.  Her swelling has gotten better.  She has been wearing her compression socks.  She denies any open wounds or infection at this time.  Her ischemic pain is resolved after intervention several weeks ago.  Her ABIs today are 1.05 on the right and 1.11 on the left with triphasic waveforms consistent with successful revascularization of the left lower extremity and maintain perfusion currently.  Current Outpatient Medications  Medication Sig Dispense Refill   Acetylcysteine (NAC 600 PO) Take by mouth.     albuterol  (PROVENTIL ) (2.5 MG/3ML) 0.083% nebulizer solution INHALE THE CONTENTS OF 1 VIAL VIA NEBULIZER EVERY 4 HOURS AS NEEDED FOR WHEEZING OR SHORTNESS OF BREATH. 450 mL 10   albuterol  (VENTOLIN  HFA) 108 (90 Base) MCG/ACT inhaler Inhale 2 puffs into the lungs every 6 (six) hours as needed for wheezing or shortness of breath. 8 g 6   ascorbic acid (VITAMIN C) 1000 MG tablet Take 1,000 mg by mouth daily.      aspirin  EC 81 MG tablet Take 1 tablet (81 mg total) by mouth daily. Swallow whole. 150 tablet 2   atorvastatin  (LIPITOR) 10 MG tablet TAKE 1 TABLET EVERY DAY 90 tablet 3   Azelastine  HCl 137 MCG/SPRAY SOLN Place 2 sprays into both nostrils 2 (two) times daily. 100 mL 5   budesonide -glycopyrrolate -formoterol  (BREZTRI  AEROSPHERE) 160-9-4.8 MCG/ACT AERO inhaler Inhale 2 puffs into the lungs in the morning and at bedtime.     budesonide -glycopyrrolate -formoterol  (BREZTRI  AEROSPHERE) 160-9-4.8 MCG/ACT AERO inhaler Inhale 2 puffs into the lungs 2 (two) times daily. 3 each 3   Cholecalciferol  (VITAMIN D ) 125 MCG (5000 UT) CAPS Take 5,000 Units by mouth daily.     clopidogrel  (PLAVIX ) 75 MG tablet  TAKE 1 TABLET EVERY DAY 90 tablet 3   Coenzyme Q10 (COQ10) 200 MG CAPS Take by mouth.     guaiFENesin  (MUCINEX ) 600 MG 12 hr tablet Take 600 mg by mouth 2 (two) times daily.     melatonin 5 MG TABS Take 1 tablet (5 mg total) by mouth at bedtime. 30 tablet 0   Multiple Vitamins-Minerals (PRESERVISION AREDS) TABS Take 1 tablet by mouth 2 (two) times daily.     OXYGEN  Inhale 4 L into the lungs daily in the afternoon.     Pari LC Sprint Nebulizer Set MISC      polyethylene glycol (MIRALAX  / GLYCOLAX ) 17 g packet Take 17 g by mouth daily. 14 each 0   senna-docusate (SENOKOT-S) 8.6-50 MG tablet Take 1 tablet by mouth 2 (two) times daily as needed for mild constipation. 60 tablet 0   traZODone  (DESYREL ) 50 MG tablet Take 0.5-1 tablets (25-50 mg total) by mouth at bedtime as needed for sleep. 30 tablet 3   verapamil  (VERELAN  PM) 360 MG 24 hr capsule Take 360 mg by mouth daily.     Vitamin E 180 MG CAPS Take 180 mg by mouth daily.     collagenase  (SANTYL ) 250 UNIT/GM ointment Apply 1 Application topically daily. (Patient not taking: Reported on 04/28/2024) 15 g 0   furosemide  (LASIX ) 20 MG tablet Take 1 tablet (20 mg total) by mouth daily for 7 days. (Patient not taking: Reported  on 04/28/2024) 7 tablet 0   levofloxacin  (LEVAQUIN ) 750 MG tablet Take 1 tablet (750 mg total) by mouth daily. 7 days (Patient not taking: Reported on 04/28/2024) 1 tablet 7   pantoprazole  (PROTONIX ) 40 MG tablet Take 1 tablet (40 mg total) by mouth daily. (Patient not taking: Reported on 04/28/2024) 14 tablet 0   predniSONE  (DELTASONE ) 10 MG tablet Take 4 tablets (40 mg total) by mouth daily with breakfast. 10 days (Patient not taking: Reported on 04/28/2024) 10 tablet 1   traMADol  (ULTRAM ) 50 MG tablet Take 50 mg by mouth every 6 (six) hours as needed for moderate pain (pain score 4-6) or severe pain (pain score 7-10). (Patient not taking: Reported on 04/28/2024)     Wound Dressings (HYDROFERA BLUE 4X4) PADS Apply to knee ulcer.  (Patient not taking: Reported on 04/28/2024) 5 each 0   No current facility-administered medications for this visit.    Past Medical History:  Diagnosis Date   AK (actinic keratosis) 12/08/2020   right pretibia inferior bx proven, LN2 01/10/21   Burping    Chronic airway obstruction, not elsewhere classified    Dyspnea    Dysrhythmia    Macular degeneration (senile) of retina, unspecified    Malignant neoplasm of urinary bladder (HCC) 03/2019   Partial bladder resection and Rad tx's.    Obstructive chronic bronchitis with exacerbation (HCC)    Osteoporosis, unspecified    Oxygen  deficiency    Personal history of peptic ulcer disease    SCC (squamous cell carcinoma) 12/08/2020   right pretibia superior, EDC 01/10/21   SCC (squamous cell carcinoma) 07/03/2022   left medial calf, clear 09/25/22   SCC (squamous cell carcinoma) 11/22/2022   right thigh   treated with ED&C   Squamous cell carcinoma in situ (SCCIS) 07/03/2022   right pretibia sccis needs treatment with fluorouracil    Tobacco use disorder    Unspecified essential hypertension    Unspecified glaucoma(365.9)     Past Surgical History:  Procedure Laterality Date   CATARACT EXTRACTION W/ INTRAOCULAR LENS  IMPLANT, BILATERAL     CYSTOSCOPY WITH STENT PLACEMENT Left 05/22/2019   Procedure: CYSTOSCOPY WITH STENT PLACEMENT;  Surgeon: Francisca Redell BROCKS, MD;  Location: [facility];  Service: Urology;  Laterality: Left;   ELBOW FRACTURE SURGERY Left    LOWER EXTREMITY ANGIOGRAPHY Right 11/20/2021   Procedure: Lower Extremity Angiography;  Surgeon: Marea Selinda RAMAN, MD;  Location: [facility];  Service: Cardiovascular;  Laterality: Right;   LOWER EXTREMITY ANGIOGRAPHY Left 09/03/2022   Procedure: Lower Extremity Angiography;  Surgeon: Marea Selinda RAMAN, MD;  Location: [facility];  Service: Cardiovascular;  Laterality: Left;   LOWER EXTREMITY ANGIOGRAPHY Left 06/03/2023   Procedure: Lower Extremity Angiography;  Surgeon: Marea Selinda RAMAN, MD;  Location: [facility];  Service: Cardiovascular;  Laterality: Left;   LOWER EXTREMITY ANGIOGRAPHY Left 03/16/2024   Procedure: Lower Extremity Angiography;  Surgeon: Marea Selinda RAMAN, MD;  Location: [facility];  Service: Cardiovascular;  Laterality: Left;   TRANSURETHRAL RESECTION OF BLADDER TUMOR N/A 05/22/2019   Procedure: TRANSURETHRAL RESECTION OF BLADDER TUMOR (TURBT);  Surgeon: Francisca Redell BROCKS, MD;  Location: [facility];  Service: Urology;  Laterality: N/A;     Social History   Tobacco Use   Smoking status: Former    Current packs/day: 0.00    Average packs/day: 1.3 packs/day for 45.0 years (58.5 ttl pk-yrs)    Types: Cigarettes    Start date: 46  Quit date: 41    Years since quitting: 27.7    Passive exposure: Past   Smokeless tobacco: Never  Vaping Use   Vaping status: Never Used  Substance Use Topics   Alcohol use: Yes    Alcohol/week: 14.0 standard drinks of alcohol    Types: 14 Glasses of wine per week    Comment: 1x/day   Drug use: No      Family History  Adopted: Yes     No Known Allergies   REVIEW OF SYSTEMS (Negative unless checked)   Constitutional: [] Weight loss  [] Fever  [] Chills Cardiac: [] Chest pain   [] Chest pressure   [] Palpitations   [] Shortness of breath when laying flat   [x] Shortness of breath at rest   [x] Shortness of breath with exertion. Vascular:  [x] Pain in legs with walking   [] Pain in legs at rest   [] Pain in legs when laying flat   [x] Claudication   [] Pain in feet when walking  [x] Pain in feet at rest  [x] Pain in feet when laying flat   [] History of DVT   [] Phlebitis   [] Swelling in legs   [] Varicose veins   [] Non-healing ulcers Pulmonary:   [] Uses home oxygen    [] Productive cough   [] Hemoptysis   [] Wheeze  [x] COPD   [] Asthma Neurologic:  [] Dizziness  [] Blackouts   [] Seizures   [] History of stroke   [] History of TIA  [] Aphasia   [] Temporary blindness   [] Dysphagia   [] Weakness or numbness in arms    [] Weakness or numbness in legs Musculoskeletal:  [] Arthritis   [] Joint swelling   [] Joint pain   [] Low back pain Hematologic:  [x] Easy bruising  [] Easy bleeding   [] Hypercoagulable state   [] Anemic  [] Hepatitis Gastrointestinal:  [] Blood in stool   [] Vomiting blood  [] Gastroesophageal reflux/heartburn   [] Difficulty swallowing. Genitourinary:  [x] Chronic kidney disease   [] Difficult urination  [] Frequent urination  [] Burning with urination   [] Blood in urine Skin:  [] Rashes   [] Ulcers   [] Wounds Psychological:  [] History of anxiety   []  History of major depression.  Physical Examination  BP (!) 144/84 (BP Location: Right Arm, Patient Position: Sitting, Cuff Size: Small)   Pulse 92   Resp 18   Wt 88 lb (39.9 kg) Comment: per patient did not weigh  BMI 17.77 kg/m  Gen:   NAD, thin and frail appearing Head: [place]/AT, + temporalis wasting. Ear/Nose/Throat: Hearing grossly intact, nares w/o erythema or drainage Eyes: Conjunctiva clear. Sclera non-icteric Neck: Supple.  Trachea midline Pulmonary:  Good air movement, no use of accessory muscles.  Cardiac: RRR, no JVD Vascular:  Vessel Right Left  Radial Palpable Palpable                          PT 1+ Palpable 1+ Palpable  DP 2+ Palpable 2+ Palpable   Gastrointestinal: soft, non-tender/non-distended. No guarding/reflex.  Musculoskeletal: M/S 5/5 throughout.  No deformity or atrophy.  Trace right lower extremity edema, 1+ left lower extremity edema. Neurologic: Sensation grossly intact in extremities.  Symmetrical.  Speech is fluent.  Psychiatric: Judgment intact, Mood & affect appropriate for pt's clinical situation. Dermatologic: No rashes or ulcers noted.  No cellulitis or open wounds.      Labs Recent Results (from the past 2160 hours)  Blood gas, venous     Status: Abnormal   Collection Time: 03/14/24  7:08 AM  Result Value Ref Range   pH, Ven 7.29 7.25 - 7.43   pCO2, Ven  52 44 - 60 mmHg   pO2, Ven 51 (H) 32 - 45 mmHg    Bicarbonate 25.0 20.0 - 28.0 mmol/L   Acid-base deficit 2.3 (H) 0.0 - 2.0 mmol/L   O2 Saturation 77.6 %   Patient temperature 37.0    Collection site VEIN     Comment: Performed at [facility], [address] [place], [place] [address]  Brain natriuretic peptide     Status: None   Collection Time: 03/14/24  7:08 AM  Result Value Ref Range   B Natriuretic Peptide 92.6 0.0 - 100.0 pg/mL    Comment: Performed at [facility], [address] [place], [place] [address]  CBC     Status: Abnormal   Collection Time: 03/14/24  7:09 AM  Result Value Ref Range   WBC 11.7 (H) 4.0 - 10.5 K/uL   RBC 4.18 3.87 - 5.11 MIL/uL   Hemoglobin 12.9 12.0 - 15.0 g/dL   HCT 59.3 63.9 - 53.9 %   MCV 97.1 80.0 - 100.0 fL   MCH 30.9 26.0 - 34.0 pg   MCHC 31.8 30.0 - 36.0 g/dL   RDW 86.2 88.4 - 84.4 %   Platelets 390 150 - 400 K/uL   nRBC 0.0 0.0 - 0.2 %    Comment: Performed at [facility], [address], [place], [place] [address]  Comprehensive metabolic panel     Status: Abnormal   Collection Time: 03/14/24  7:09 AM  Result Value Ref Range   Sodium 140 135 - 145 mmol/L   Potassium 4.7 3.5 - 5.1 mmol/L   Chloride 104 98 - 111 mmol/L   CO2 25 22 - 32 mmol/L   Glucose, Bld 176 (H) 70 - 99 mg/dL    Comment: Glucose reference range applies only to samples taken after fasting for at least 8 hours.   BUN 21 8 - 23 mg/dL   Creatinine, Ser 9.35 0.44 - 1.00 mg/dL   Calcium  9.2 8.9 - 10.3 mg/dL   Total Protein 5.9 (L) 6.5 - 8.1 g/dL   Albumin 3.5 3.5 - 5.0 g/dL   AST 20 15 - 41 U/L   ALT 19 0 - 44 U/L   Alkaline Phosphatase 89 38 - 126 U/L   Total Bilirubin 0.4 0.0 - 1.2 mg/dL   GFR, Estimated >39 >39 mL/min    Comment: (NOTE) Calculated using the CKD-EPI Creatinine Equation (2021)    Anion gap 11 5 - 15    Comment: Performed at [facility], [address], [place], [place] [address]  Troponin I (High Sensitivity)     Status: None   Collection Time:  03/14/24  7:09 AM  Result Value Ref Range   Troponin I (High Sensitivity) 7 <18 ng/L    Comment: (NOTE) Elevated high sensitivity troponin I (hsTnI) values and significant  changes across serial measurements may suggest ACS but many other  chronic and acute conditions are known to elevate hsTnI results.  Refer to the Links section for chest pain algorithms and additional  guidance. Performed at [facility], [address] [place], [place] [address]   APTT     Status: None   Collection Time: 03/14/24  7:09 AM  Result Value Ref Range   aPTT 30 24 - 36 seconds    Comment: Performed at [facility], [address] [place], [place] [address]  Protime-INR     Status: None   Collection Time: 03/14/24  7:09 AM  Result  Value Ref Range   Prothrombin Time 13.5 11.4 - 15.2 seconds   INR 1.0 0.8 - 1.2    Comment: (NOTE) INR goal varies based on device and disease states. Performed at [facility], [address] [place], [place] [address]   Lactic acid, plasma     Status: Abnormal   Collection Time: 03/14/24 10:10 AM  Result Value Ref Range   Lactic Acid, Venous 2.0 (HH) 0.5 - 1.9 mmol/L    Comment: CRITICAL RESULT CALLED TO, READ BACK BY AND VERIFIED WITH JESS SAVAGE AT 1048 03/14/24.PMF Performed at [facility], [address] [place], [place] [address]   Blood culture (routine x 2)     Status: None   Collection Time: 03/14/24 10:10 AM   Specimen: BLOOD LEFT ARM  Result Value Ref Range   Specimen Description BLOOD LEFT ARM    Special Requests      BOTTLES DRAWN AEROBIC AND ANAEROBIC Blood Culture results may not be optimal due to an inadequate volume of blood received in culture bottles   Culture      NO GROWTH 5 DAYS Performed at [facility], [address] [place], [place] [address]    Report Status 03/19/2024 FINAL   Blood culture (routine x 2)     Status: None   Collection Time: 03/14/24 10:10 AM   Specimen: BLOOD  RIGHT ARM  Result Value Ref Range   Specimen Description BLOOD RIGHT ARM    Special Requests      BOTTLES DRAWN AEROBIC AND ANAEROBIC Blood Culture results may not be optimal due to an inadequate volume of blood received in culture bottles   Culture      NO GROWTH 5 DAYS Performed at [facility], [address] [place], [place] [address]    Report Status 03/19/2024 FINAL   Lactic acid, plasma     Status: None   Collection Time: 03/14/24  1:20 PM  Result Value Ref Range   Lactic Acid, Venous 1.7 0.5 - 1.9 mmol/L    Comment: Performed at [facility], [address] [place], [place] [address]  Respiratory (~20 pathogens) panel by PCR     Status: None   Collection Time: 03/14/24  3:39 PM   Specimen: Nasopharyngeal Swab; Respiratory  Result Value Ref Range   Adenovirus NOT DETECTED NOT DETECTED   Coronavirus 229E NOT DETECTED NOT DETECTED    Comment: (NOTE) The Coronavirus on the Respiratory Panel, DOES NOT test for the novel  Coronavirus (2019 nCoV)    Coronavirus HKU1 NOT DETECTED NOT DETECTED   Coronavirus NL63 NOT DETECTED NOT DETECTED   Coronavirus OC43 NOT DETECTED NOT DETECTED   Metapneumovirus NOT DETECTED NOT DETECTED   Rhinovirus / Enterovirus NOT DETECTED NOT DETECTED   Influenza A NOT DETECTED NOT DETECTED   Influenza B NOT DETECTED NOT DETECTED   Parainfluenza Virus 1 NOT DETECTED NOT DETECTED   Parainfluenza Virus 2 NOT DETECTED NOT DETECTED   Parainfluenza Virus 3 NOT DETECTED NOT DETECTED   Parainfluenza Virus 4 NOT DETECTED NOT DETECTED   Respiratory Syncytial Virus NOT DETECTED NOT DETECTED   Bordetella pertussis NOT DETECTED NOT DETECTED   Bordetella Parapertussis NOT DETECTED NOT DETECTED   Chlamydophila pneumoniae NOT DETECTED NOT DETECTED   Mycoplasma pneumoniae NOT DETECTED NOT DETECTED    Comment: Performed at [facility], [address], [place], [place] [address]  Expectorated Sputum Assessment w Gram Stain, Rflx to Resp  Cult  Status: None   Collection Time: 03/14/24  3:40 PM   Specimen: Sputum  Result Value Ref Range   Specimen Description SPUTUM    Special Requests NONE    Sputum evaluation      Sputum specimen not acceptable for testing.  Please recollect.   CALLED TO JES SAVAGE @1750  03/14/24 MJU Performed at [facility], [address] [place], [place] [address]    Report Status 03/14/2024 FINAL   Heparin  level (unfractionated)     Status: None   Collection Time: 03/14/24  7:14 PM  Result Value Ref Range   Heparin  Unfractionated 0.46 0.30 - 0.70 IU/mL    Comment: (NOTE) The clinical reportable range upper limit is being lowered to >1.10 to align with the FDA approved guidance for the current laboratory assay.  If heparin  results are below expected values, and patient dosage has  been confirmed, suggest follow up testing of antithrombin III levels. Performed at [facility], [address] [place], [place] [address]   Heparin  level (unfractionated)     Status: None   Collection Time: 03/15/24  7:14 AM  Result Value Ref Range   Heparin  Unfractionated 0.47 0.30 - 0.70 IU/mL    Comment: (NOTE) The clinical reportable range upper limit is being lowered to >1.10 to align with the FDA approved guidance for the current laboratory assay.  If heparin  results are below expected values, and patient dosage has  been confirmed, suggest follow up testing of antithrombin III levels. Performed at [facility], [address] [place], [place] [address]   CBC     Status: Abnormal   Collection Time: 03/15/24  7:14 AM  Result Value Ref Range   WBC 5.7 4.0 - 10.5 K/uL   RBC 2.78 (L) 3.87 - 5.11 MIL/uL   Hemoglobin 8.7 (L) 12.0 - 15.0 g/dL    Comment: REPEATED TO VERIFY   HCT 26.5 (L) 36.0 - 46.0 %   MCV 95.3 80.0 - 100.0 fL   MCH 31.3 26.0 - 34.0 pg   MCHC 32.8 30.0 - 36.0 g/dL   RDW 86.1 88.4 - 84.4 %   Platelets 255 150 - 400 K/uL   nRBC 0.0 0.0 - 0.2 %     Comment: Performed at [facility], [address] [place], [place] [address]  Basic metabolic panel with GFR     Status: Abnormal   Collection Time: 03/15/24  9:03 AM  Result Value Ref Range   Sodium 140 135 - 145 mmol/L   Potassium 4.4 3.5 - 5.1 mmol/L   Chloride 105 98 - 111 mmol/L   CO2 26 22 - 32 mmol/L   Glucose, Bld 149 (H) 70 - 99 mg/dL    Comment: Glucose reference range applies only to samples taken after fasting for at least 8 hours.   BUN 22 8 - 23 mg/dL   Creatinine, Ser 9.37 0.44 - 1.00 mg/dL   Calcium  8.8 (L) 8.9 - 10.3 mg/dL   GFR, Estimated >39 >39 mL/min    Comment: (NOTE) Calculated using the CKD-EPI Creatinine Equation (2021)    Anion gap 9 5 - 15    Comment: Performed at [facility], [address] [place], [place] [address]  CBC     Status: Abnormal   Collection Time: 03/15/24  9:03 AM  Result Value Ref Range   WBC 6.5 4.0 - 10.5 K/uL   RBC 2.96 (L) 3.87 - 5.11 MIL/uL   Hemoglobin 9.1 (L) 12.0 - 15.0  g/dL   HCT 71.5 (L) 63.9 - 53.9 %   MCV 95.9 80.0 - 100.0 fL   MCH 30.7 26.0 - 34.0 pg   MCHC 32.0 30.0 - 36.0 g/dL   RDW 85.9 88.4 - 84.4 %   Platelets 272 150 - 400 K/uL   nRBC 0.0 0.0 - 0.2 %    Comment: Performed at [facility], [address], [place], [place] [address]  Magnesium      Status: None   Collection Time: 03/15/24  9:03 AM  Result Value Ref Range   Magnesium  1.9 1.7 - 2.4 mg/dL    Comment: Performed at [facility], [address], [place], [place] [address]  Phosphorus     Status: None   Collection Time: 03/15/24  9:03 AM  Result Value Ref Range   Phosphorus 3.2 2.5 - 4.6 mg/dL    Comment: Performed at [facility], [address] [place], [place] [address]  Vitamin B12     Status: None   Collection Time: 03/15/24  9:03 AM  Result Value Ref Range   Vitamin B-12 224 180 - 914 pg/mL    Comment: (NOTE) This assay is not validated for testing neonatal or myeloproliferative syndrome  specimens for Vitamin B12 levels. Performed at [facility], [address], [place], [place] [address]   VITAMIN D  25 Hydroxy (Vit-D Deficiency, Fractures)     Status: None   Collection Time: 03/15/24  9:03 AM  Result Value Ref Range   Vit D, 25-Hydroxy 64.20 30 - 100 ng/mL    Comment: (NOTE) Vitamin D  deficiency has been defined by the Institute of Medicine  and an Endocrine Society practice guideline as a level of serum 25-OH  vitamin D  less than 20 ng/mL (1,2). The Endocrine Society went on to  further define vitamin D  insufficiency as a level between 21 and 29  ng/mL (2).  1. IOM (Institute of Medicine). 2010. Dietary reference intakes for  calcium  and D. [place]: [employer]. 2. Holick MF, Binkley [place], Bischoff-Ferrari HA, et al. Evaluation,  treatment, and prevention of vitamin D  deficiency: an Endocrine  Society clinical practice guideline, JCEM. 2011 Jul; 96(7): 1911-30.  Performed at [facility], [address] [place], [place] [address]   Resp panel by RT-PCR (RSV, Flu A&B, Covid) Anterior Nasal Swab     Status: None   Collection Time: 03/15/24  4:10 PM   Specimen: Anterior Nasal Swab  Result Value Ref Range   SARS Coronavirus 2 by RT PCR NEGATIVE NEGATIVE    Comment: (NOTE) SARS-CoV-2 target nucleic acids are NOT DETECTED.  The SARS-CoV-2 RNA is generally detectable in upper respiratory specimens during the acute phase of infection. The lowest concentration of SARS-CoV-2 viral copies this assay can detect is 138 copies/mL. A negative result does not preclude SARS-Cov-2 infection and should not be used as the sole basis for treatment or other patient management decisions. A negative result may occur with  improper specimen collection/handling, submission of specimen other than nasopharyngeal swab, presence of viral mutation(s) within the areas targeted by this assay, and inadequate number of viral copies(<138 copies/mL). A  negative result must be combined with clinical observations, patient history, and epidemiological information. The expected result is Negative.  Fact Sheet for Patients:  BloggerCourse.com  Fact Sheet for Healthcare Providers:  SeriousBroker.it  This test is no t yet approved or cleared by the [place]  FDA and  has been authorized for detection and/or  diagnosis of SARS-CoV-2 by FDA under an Emergency Use Authorization (EUA). This EUA will remain  in effect (meaning this test can be used) for the duration of the COVID-19 declaration under Section 564(b)(1) of the Act, 21 U.S.C.section 360bbb-3(b)(1), unless the authorization is terminated  or revoked sooner.       Influenza A by PCR NEGATIVE NEGATIVE   Influenza B by PCR NEGATIVE NEGATIVE    Comment: (NOTE) The Xpert Xpress SARS-CoV-2/FLU/RSV plus assay is intended as an aid in the diagnosis of influenza from Nasopharyngeal swab specimens and should not be used as a sole basis for treatment. Nasal washings and aspirates are unacceptable for Xpert Xpress SARS-CoV-2/FLU/RSV testing.  Fact Sheet for Patients: BloggerCourse.com  Fact Sheet for Healthcare Providers: SeriousBroker.it  This test is not yet approved or cleared by the [place]  FDA and has been authorized for detection and/or diagnosis of SARS-CoV-2 by FDA under an Emergency Use Authorization (EUA). This EUA will remain in effect (meaning this test can be used) for the duration of the COVID-19 declaration under Section 564(b)(1) of the Act, 21 U.S.C. section 360bbb-3(b)(1), unless the authorization is terminated or revoked.     Resp Syncytial Virus by PCR NEGATIVE NEGATIVE    Comment: (NOTE) Fact Sheet for Patients: BloggerCourse.com  Fact Sheet for Healthcare Providers: SeriousBroker.it  This test is not  yet approved or cleared by the [place]  FDA and has been authorized for detection and/or diagnosis of SARS-CoV-2 by FDA under an Emergency Use Authorization (EUA). This EUA will remain in effect (meaning this test can be used) for the duration of the COVID-19 declaration under Section 564(b)(1) of the Act, 21 U.S.C. section 360bbb-3(b)(1), unless the authorization is terminated or revoked.  Performed at [facility], [address] [place], [place] [address]   Blood gas, venous     Status: Abnormal   Collection Time: 03/15/24  4:26 PM  Result Value Ref Range   O2 Content 4.0 L/min   Delivery systems NASAL CANNULA    pH, Ven 7.46 (H) 7.25 - 7.43   pCO2, Ven 39 (L) 44 - 60 mmHg   pO2, Ven 42 32 - 45 mmHg   Bicarbonate 27.7 20.0 - 28.0 mmol/L   Acid-Base Excess 3.7 (H) 0.0 - 2.0 mmol/L   O2 Saturation 70.8 %   Patient temperature 37.0    Collection site VEIN     Comment: Performed at [facility], [address] [place], [place] [address]  Heparin  level (unfractionated)     Status: None   Collection Time: 03/16/24  6:13 AM  Result Value Ref Range   Heparin  Unfractionated 0.56 0.30 - 0.70 IU/mL    Comment: (NOTE) The clinical reportable range upper limit is being lowered to >1.10 to align with the FDA approved guidance for the current laboratory assay.  If heparin  results are below expected values, and patient dosage has  been confirmed, suggest follow up testing of antithrombin III levels. Performed at [facility], [address] [place], [place] [address]   Basic metabolic panel with GFR     Status: Abnormal   Collection Time: 03/16/24  6:13 AM  Result Value Ref Range   Sodium 142 135 - 145 mmol/L   Potassium 4.4 3.5 - 5.1 mmol/L   Chloride 106 98 - 111 mmol/L   CO2 24 22 - 32 mmol/L   Glucose, Bld 121 (H) 70 - 99 mg/dL    Comment: Glucose reference range applies only to samples taken after  fasting for at least 8 hours.   BUN 24 (H)  8 - 23 mg/dL   Creatinine, Ser 9.42 0.44 - 1.00 mg/dL   Calcium  8.7 (L) 8.9 - 10.3 mg/dL   GFR, Estimated >39 >39 mL/min    Comment: (NOTE) Calculated using the CKD-EPI Creatinine Equation (2021)    Anion gap 12 5 - 15    Comment: Performed at [facility], [address] [place], [place] [address]  CBC     Status: Abnormal   Collection Time: 03/16/24  6:13 AM  Result Value Ref Range   WBC 9.8 4.0 - 10.5 K/uL   RBC 2.46 (L) 3.87 - 5.11 MIL/uL   Hemoglobin 7.5 (L) 12.0 - 15.0 g/dL   HCT 76.4 (L) 63.9 - 53.9 %   MCV 95.5 80.0 - 100.0 fL   MCH 30.5 26.0 - 34.0 pg   MCHC 31.9 30.0 - 36.0 g/dL   RDW 85.5 88.4 - 84.4 %   Platelets 267 150 - 400 K/uL   nRBC 0.0 0.0 - 0.2 %    Comment: Performed at [facility], [address] [place], [place] [address]  Magnesium      Status: None   Collection Time: 03/16/24  6:13 AM  Result Value Ref Range   Magnesium  2.0 1.7 - 2.4 mg/dL    Comment: Performed at [facility], [address] [place], [place] [address]  Phosphorus     Status: None   Collection Time: 03/16/24  6:13 AM  Result Value Ref Range   Phosphorus 3.5 2.5 - 4.6 mg/dL    Comment: Performed at [facility], [address] [place], [place] [address]  Hemoglobin and hematocrit, blood     Status: Abnormal   Collection Time: 03/16/24  4:03 PM  Result Value Ref Range   Hemoglobin 7.0 (L) 12.0 - 15.0 g/dL   HCT 78.6 (L) 63.9 - 53.9 %    Comment: Performed at [facility], [address] [place], [place] [address]  ABO/Rh     Status: None   Collection Time: 03/16/24  4:03 PM  Result Value Ref Range   ABO/RH(D) AB POS    DAT, IgG NOT NEEDED   Prepare RBC (crossmatch)     Status: None   Collection Time: 03/16/24  4:41 PM  Result Value Ref Range   Order Confirmation      ORDER PROCESSED BY BLOOD BANK Performed at [facility], [address] [place], [place] [address]   Type and screen [facility]     Status: None   Collection Time: 03/16/24  4:51 PM  Result Value Ref Range   ABO/RH(D) AB POS    Antibody Screen NEG    Sample Expiration 03/19/2024,2359    Unit Number [id number]    Blood Component Type RED CELLS,LR    Unit division 00    Status of Unit ISSUED,FINAL    Transfusion Status OK TO TRANSFUSE    Crossmatch Result Compatible    Unit Number [id number]    Blood Component Type RED CELLS,LR    Unit division 00    Status of Unit ISSUED,FINAL    Transfusion Status OK TO TRANSFUSE    Crossmatch Result      Compatible Performed at [facility], [address] [place], [place] [address]   BPAM RBC     Status: None   Collection Time: 03/16/24  4:51 PM  Result Value Ref Range   ISSUE  DATE / TIME [id number]    Blood Product Unit Number [id number]    PRODUCT CODE E0382V00    Unit Type and Rh 0600    Blood Product Expiration Date 797491837640    ISSUE DATE / TIME [id number]    Blood Product Unit Number [id number]    PRODUCT CODE E0382V00    Unit Type and Rh 6200    Blood Product Expiration Date 797490937640   Basic metabolic panel with GFR     Status: Abnormal   Collection Time: 03/17/24  4:43 AM  Result Value Ref Range   Sodium 137 135 - 145 mmol/L   Potassium 4.7 3.5 - 5.1 mmol/L   Chloride 104 98 - 111 mmol/L   CO2 26 22 - 32 mmol/L   Glucose, Bld 134 (H) 70 - 99 mg/dL    Comment: Glucose reference range applies only to samples taken after fasting for at least 8 hours.   BUN 28 (H) 8 - 23 mg/dL   Creatinine, Ser 9.14 0.44 - 1.00 mg/dL   Calcium  8.1 (L) 8.9 - 10.3 mg/dL   GFR, Estimated >39 >39 mL/min    Comment: (NOTE) Calculated using the CKD-EPI Creatinine Equation (2021)    Anion gap 7 5 - 15    Comment: Performed at [facility], [address] [place], [place] [address]  CBC     Status: Abnormal   Collection Time: 03/17/24  4:43 AM  Result Value Ref Range   WBC 9.2 4.0 - 10.5 K/uL   RBC 2.82 (L) 3.87 - 5.11  MIL/uL   Hemoglobin 8.1 (L) 12.0 - 15.0 g/dL   HCT 74.5 (L) 63.9 - 53.9 %   MCV 90.1 80.0 - 100.0 fL   MCH 28.7 26.0 - 34.0 pg   MCHC 31.9 30.0 - 36.0 g/dL   RDW 80.6 (H) 88.4 - 84.4 %   Platelets 218 150 - 400 K/uL   nRBC 0.5 (H) 0.0 - 0.2 %    Comment: Performed at [facility], [address] [place], [place] [address]  Magnesium      Status: None   Collection Time: 03/17/24  4:43 AM  Result Value Ref Range   Magnesium  2.0 1.7 - 2.4 mg/dL    Comment: Performed at [facility], [address], [place], [place] [address]  Phosphorus     Status: None   Collection Time: 03/17/24  4:43 AM  Result Value Ref Range   Phosphorus 3.9 2.5 - 4.6 mg/dL    Comment: Performed at [facility], [address] [place], [place] [address]  Hemoglobin and hematocrit, blood     Status: Abnormal   Collection Time: 03/17/24  4:49 PM  Result Value Ref Range   Hemoglobin 7.6 (L) 12.0 - 15.0 g/dL   HCT 76.9 (L) 63.9 - 53.9 %    Comment: Performed at [facility], [address], [place], [place] [address]  Basic metabolic panel with GFR     Status: Abnormal   Collection Time: 03/18/24  5:29 AM  Result Value Ref Range   Sodium 138 135 - 145 mmol/L   Potassium 5.6 (H) 3.5 - 5.1 mmol/L   Chloride 105 98 - 111 mmol/L   CO2 27 22 - 32 mmol/L   Glucose, Bld 121 (H) 70 - 99 mg/dL    Comment: Glucose reference range applies only to samples taken after fasting for at least 8 hours.   BUN 33 (H) 8 - 23 mg/dL   Creatinine,  Ser 0.95 0.44 - 1.00 mg/dL   Calcium  8.5 (L) 8.9 - 10.3 mg/dL   GFR, Estimated 58 (L) >60 mL/min    Comment: (NOTE) Calculated using the CKD-EPI Creatinine Equation (2021)    Anion gap 6 5 - 15    Comment: Performed at [facility], [address] [place], [place] [address]  Magnesium      Status: None   Collection Time: 03/18/24  5:29 AM  Result Value Ref Range   Magnesium  2.3 1.7 - 2.4 mg/dL    Comment: Performed at [facility], [address] [place], [place] [address]  Phosphorus     Status: None   Collection Time: 03/18/24  5:29 AM  Result Value Ref Range   Phosphorus 3.0 2.5 - 4.6 mg/dL    Comment: Performed at [facility], [address] [place], [place] [address]  CBC     Status: Abnormal   Collection Time: 03/18/24  7:08 AM  Result Value Ref Range   WBC 10.2 4.0 - 10.5 K/uL   RBC 2.75 (L) 3.87 - 5.11 MIL/uL   Hemoglobin 8.0 (L) 12.0 - 15.0 g/dL   HCT 75.3 (L) 63.9 - 53.9 %   MCV 89.5 80.0 - 100.0 fL   MCH 29.1 26.0 - 34.0 pg   MCHC 32.5 30.0 - 36.0 g/dL   RDW 80.9 (H) 88.4 - 84.4 %   Platelets 203 150 - 400 K/uL   nRBC 0.3 (H) 0.0 - 0.2 %    Comment: Performed at [facility], [address] [place], [place] [address]  Basic metabolic panel     Status: Abnormal   Collection Time: 03/18/24 12:52 PM  Result Value Ref Range   Sodium 139 135 - 145 mmol/L   Potassium 5.4 (H) 3.5 - 5.1 mmol/L   Chloride 104 98 - 111 mmol/L   CO2 28 22 - 32 mmol/L   Glucose, Bld 137 (H) 70 - 99 mg/dL    Comment: Glucose reference range applies only to samples taken after fasting for at least 8 hours.   BUN 36 (H) 8 - 23 mg/dL   Creatinine, Ser 8.92 (H) 0.44 - 1.00 mg/dL   Calcium  8.8 (L) 8.9 - 10.3 mg/dL   GFR, Estimated 50 (L) >60 mL/min    Comment: (NOTE) Calculated using the CKD-EPI Creatinine Equation (2021)    Anion gap 7 5 - 15    Comment: Performed at [facility], [address], [place], [place] [address]  Lactate dehydrogenase (pleural or peritoneal fluid)     Status: Abnormal   Collection Time: 03/18/24  3:28 PM  Result Value Ref Range   LD, Fluid 47 (H) 3 - 23 U/L    Comment: (NOTE) Results should be evaluated in conjunction with serum values    Fluid Type-FLDH CYTO PLEU     Comment: Performed at [facility], [address] [place], [place] [address]  Body fluid cell count with differential     Status: Abnormal   Collection Time: 03/18/24   3:28 PM  Result Value Ref Range   Fluid Type-FCT CYTO PLEU    Color, Fluid YELLOW YELLOW   Appearance, Fluid HAZY (A) CLEAR   Total Nucleated Cell Count, Fluid 500 cu mm   Neutrophil Count, Fluid 38 %   Lymphs, Fluid 49 %   Monocyte-Macrophage-Serous Fluid 13 %   Eos, Fluid 0 %    Comment: Performed at [facility], [address] [place],  [place] [address]  Protein, pleural or peritoneal fluid     Status: None   Collection Time: 03/18/24  3:28 PM  Result Value Ref Range   Total protein, fluid <3.0 g/dL    Comment: (NOTE) No normal range established for this test Results should be evaluated in conjunction with serum values    Fluid Type-FTP CYTO PLEU     Comment: Performed at [facility], [address] [place], [place] [address]  Body fluid culture w Gram Stain     Status: None   Collection Time: 03/18/24  3:28 PM   Specimen: PATH Cytology Pleural fluid  Result Value Ref Range   Specimen Description      PLEURAL Performed at [facility], [address], [place], [place] [address]    Special Requests      PLEURAL Performed at [facility], [address] [place], [place] [address]    Gram Stain NO WBC SEEN NO ORGANISMS SEEN     Culture      NO GROWTH 4 DAYS Performed at [facility], [address] [place], [place] [address]    Report Status 03/22/2024 FINAL   Pathologist smear review     Status: None   Collection Time: 03/18/24  3:28 PM  Result Value Ref Range   Path Review Cytospin of pleural fluid is reviewed.     Comment: Negative for malignancy. Histiocytes, reactive mesothelial cells, and neutrophils. Reviewed by Wally Highland, MD Performed at [facility], [address] [place], [place] [address]   Basic metabolic panel with GFR     Status: Abnormal   Collection Time: 03/19/24  3:12 AM  Result Value Ref Range   Sodium 137 135 - 145 mmol/L   Potassium 5.4 (H) 3.5 - 5.1 mmol/L   Chloride 103 98  - 111 mmol/L   CO2 27 22 - 32 mmol/L   Glucose, Bld 158 (H) 70 - 99 mg/dL    Comment: Glucose reference range applies only to samples taken after fasting for at least 8 hours.   BUN 37 (H) 8 - 23 mg/dL   Creatinine, Ser 9.08 0.44 - 1.00 mg/dL   Calcium  8.5 (L) 8.9 - 10.3 mg/dL   GFR, Estimated >39 >39 mL/min    Comment: (NOTE) Calculated using the CKD-EPI Creatinine Equation (2021)    Anion gap 7 5 - 15    Comment: Performed at [facility], [address] [place], [place] [address]  CBC     Status: Abnormal   Collection Time: 03/19/24  3:12 AM  Result Value Ref Range   WBC 7.1 4.0 - 10.5 K/uL   RBC 2.30 (L) 3.87 - 5.11 MIL/uL   Hemoglobin 6.7 (L) 12.0 - 15.0 g/dL   HCT 78.9 (L) 63.9 - 53.9 %   MCV 91.3 80.0 - 100.0 fL   MCH 29.1 26.0 - 34.0 pg   MCHC 31.9 30.0 - 36.0 g/dL   RDW 81.7 (H) 88.4 - 84.4 %   Platelets 200 150 - 400 K/uL   nRBC 0.0 0.0 - 0.2 %    Comment: Performed at [facility], [address] [employer] [place], [place] [address]  Magnesium      Status: None   Collection Time: 03/19/24  3:12 AM  Result Value Ref Range   Magnesium  2.0 1.7 - 2.4 mg/dL    Comment: Performed at [facility], [address], [place], [place] [address]  Phosphorus     Status:  None   Collection Time: 03/19/24  3:12 AM  Result Value Ref Range   Phosphorus 3.0 2.5 - 4.6 mg/dL    Comment: Performed at [facility], [address] [place], [place] [address]  Hepatic function panel     Status: Abnormal   Collection Time: 03/19/24  3:12 AM  Result Value Ref Range   Total Protein 4.6 (L) 6.5 - 8.1 g/dL   Albumin 2.5 (L) 3.5 - 5.0 g/dL   AST 892 (H) 15 - 41 U/L   ALT 226 (H) 0 - 44 U/L   Alkaline Phosphatase 91 38 - 126 U/L   Total Bilirubin 0.7 0.0 - 1.2 mg/dL   Bilirubin, Direct <9.8 0.0 - 0.2 mg/dL   Indirect Bilirubin NOT CALCULATED 0.3 - 0.9 mg/dL    Comment: Performed at [facility], [address], [place], [place] [address]  Prepare  RBC (crossmatch)     Status: None   Collection Time: 03/19/24  7:42 AM  Result Value Ref Range   Order Confirmation      ORDER PROCESSED BY BLOOD BANK Performed at [facility], [address], [place], [place] [address]   Lactate dehydrogenase     Status: None   Collection Time: 03/19/24  9:20 AM  Result Value Ref Range   LDH 191 98 - 192 U/L    Comment: Performed at [facility], [address] [place], [place] [address]  Procalcitonin     Status: None   Collection Time: 03/19/24  9:20 AM  Result Value Ref Range   Procalcitonin <0.10 ng/mL    Comment:        Interpretation: PCT (Procalcitonin) <= 0.5 ng/mL: Systemic infection (sepsis) is not likely. Local bacterial infection is possible. (NOTE)       Sepsis PCT Algorithm           Lower Respiratory Tract                                      Infection PCT Algorithm    ----------------------------     ----------------------------         PCT < 0.25 ng/mL                PCT < 0.10 ng/mL          Strongly encourage             Strongly discourage   discontinuation of antibiotics    initiation of antibiotics    ----------------------------     -----------------------------       PCT 0.25 - 0.50 ng/mL            PCT 0.10 - 0.25 ng/mL               OR       >80% decrease in PCT            Discourage initiation of                                            antibiotics      Encourage discontinuation           of antibiotics    ----------------------------     -----------------------------         PCT >= 0.50 ng/mL  PCT 0.26 - 0.50 ng/mL               AND        <80% decrease in PCT             Encourage initiation of                                             antibiotics       Encourage continuation           of antibiotics    ----------------------------     -----------------------------        PCT >= 0.50 ng/mL                  PCT > 0.50 ng/mL               AND         increase in PCT                   Strongly encourage                                      initiation of antibiotics    Strongly encourage escalation           of antibiotics                                     -----------------------------                                           PCT <= 0.25 ng/mL                                                 OR                                        > 80% decrease in PCT                                      Discontinue / Do not initiate                                             antibiotics  Performed at [facility], [address] [place], [place] [address]   Hemoglobin and hematocrit, blood     Status: Abnormal   Collection Time: 03/19/24  4:55 PM  Result Value Ref Range   Hemoglobin 10.6 (L) 12.0 - 15.0 g/dL    Comment: REPEATED TO VERIFY   HCT 31.2 (L) 36.0 - 46.0 %    Comment: Performed at [facility], [address], [place], [place] [address]  Basic  metabolic panel with GFR     Status: Abnormal   Collection Time: 03/20/24  4:49 AM  Result Value Ref Range   Sodium 138 135 - 145 mmol/L   Potassium 4.8 3.5 - 5.1 mmol/L   Chloride 98 98 - 111 mmol/L   CO2 31 22 - 32 mmol/L   Glucose, Bld 140 (H) 70 - 99 mg/dL    Comment: Glucose reference range applies only to samples taken after fasting for at least 8 hours.   BUN 41 (H) 8 - 23 mg/dL   Creatinine, Ser 9.21 0.44 - 1.00 mg/dL   Calcium  8.8 (L) 8.9 - 10.3 mg/dL   GFR, Estimated >39 >39 mL/min    Comment: (NOTE) Calculated using the CKD-EPI Creatinine Equation (2021)    Anion gap 9 5 - 15    Comment: Performed at [facility], [address] [place], [place] [address]  CBC     Status: Abnormal   Collection Time: 03/20/24  4:49 AM  Result Value Ref Range   WBC 7.3 4.0 - 10.5 K/uL   RBC 3.11 (L) 3.87 - 5.11 MIL/uL   Hemoglobin 9.6 (L) 12.0 - 15.0 g/dL   HCT 71.5 (L) 63.9 - 53.9 %   MCV 91.3 80.0 - 100.0 fL   MCH 30.9 26.0 - 34.0 pg   MCHC 33.8 30.0 - 36.0 g/dL   RDW 83.7  (H) 88.4 - 15.5 %   Platelets 212 150 - 400 K/uL   nRBC 0.3 (H) 0.0 - 0.2 %    Comment: Performed at [facility], [address] [place], [place] [address]  Hepatic function panel     Status: Abnormal   Collection Time: 03/20/24  4:49 AM  Result Value Ref Range   Total Protein 4.8 (L) 6.5 - 8.1 g/dL   Albumin 2.6 (L) 3.5 - 5.0 g/dL   AST 99 (H) 15 - 41 U/L   ALT 248 (H) 0 - 44 U/L   Alkaline Phosphatase 113 38 - 126 U/L   Total Bilirubin 0.6 0.0 - 1.2 mg/dL   Bilirubin, Direct <9.8 0.0 - 0.2 mg/dL   Indirect Bilirubin NOT CALCULATED 0.3 - 0.9 mg/dL    Comment: Performed at [facility], [address] [place], [place] [address]  Basic metabolic panel with GFR     Status: Abnormal   Collection Time: 03/21/24  2:03 AM  Result Value Ref Range   Sodium 137 135 - 145 mmol/L   Potassium 4.3 3.5 - 5.1 mmol/L   Chloride 99 98 - 111 mmol/L   CO2 30 22 - 32 mmol/L   Glucose, Bld 124 (H) 70 - 99 mg/dL    Comment: Glucose reference range applies only to samples taken after fasting for at least 8 hours.   BUN 39 (H) 8 - 23 mg/dL   Creatinine, Ser 9.38 0.44 - 1.00 mg/dL   Calcium  8.9 8.9 - 10.3 mg/dL   GFR, Estimated >39 >39 mL/min    Comment: (NOTE) Calculated using the CKD-EPI Creatinine Equation (2021)    Anion gap 8 5 - 15    Comment: Performed at [facility], [address] [place], [place] [address]  CBC     Status: Abnormal   Collection Time: 03/21/24  2:03 AM  Result Value Ref Range   WBC 11.4 (H) 4.0 - 10.5 K/uL   RBC 3.29 (L) 3.87 - 5.11 MIL/uL   Hemoglobin 10.0 (L) 12.0 - 15.0 g/dL   HCT 69.5 (L)  36.0 - 46.0 %   MCV 92.4 80.0 - 100.0 fL   MCH 30.4 26.0 - 34.0 pg   MCHC 32.9 30.0 - 36.0 g/dL   RDW 83.9 (H) 88.4 - 84.4 %   Platelets 248 150 - 400 K/uL   nRBC 0.0 0.0 - 0.2 %    Comment: Performed at [facility], [address] [place], [place] [address]  Hepatic function panel     Status: Abnormal   Collection Time: 03/21/24   2:03 AM  Result Value Ref Range   Total Protein 4.9 (L) 6.5 - 8.1 g/dL   Albumin 2.7 (L) 3.5 - 5.0 g/dL   AST 73 (H) 15 - 41 U/L   ALT 202 (H) 0 - 44 U/L   Alkaline Phosphatase 101 38 - 126 U/L   Total Bilirubin 0.7 0.0 - 1.2 mg/dL   Bilirubin, Direct 0.1 0.0 - 0.2 mg/dL   Indirect Bilirubin 0.6 0.3 - 0.9 mg/dL    Comment: Performed at [facility], [address] [place], [place] [address]  Basic metabolic panel with GFR     Status: Abnormal   Collection Time: 03/22/24  5:43 AM  Result Value Ref Range   Sodium 138 135 - 145 mmol/L   Potassium 3.9 3.5 - 5.1 mmol/L   Chloride 99 98 - 111 mmol/L   CO2 31 22 - 32 mmol/L   Glucose, Bld 102 (H) 70 - 99 mg/dL    Comment: Glucose reference range applies only to samples taken after fasting for at least 8 hours.   BUN 30 (H) 8 - 23 mg/dL   Creatinine, Ser 9.24 0.44 - 1.00 mg/dL   Calcium  8.3 (L) 8.9 - 10.3 mg/dL   GFR, Estimated >39 >39 mL/min    Comment: (NOTE) Calculated using the CKD-EPI Creatinine Equation (2021)    Anion gap 8 5 - 15    Comment: Performed at [facility], [address] [place], [place] [address]  CBC     Status: Abnormal   Collection Time: 03/22/24  5:43 AM  Result Value Ref Range   WBC 7.1 4.0 - 10.5 K/uL   RBC 3.08 (L) 3.87 - 5.11 MIL/uL   Hemoglobin 9.4 (L) 12.0 - 15.0 g/dL   HCT 70.3 (L) 63.9 - 53.9 %   MCV 96.1 80.0 - 100.0 fL   MCH 30.5 26.0 - 34.0 pg   MCHC 31.8 30.0 - 36.0 g/dL   RDW 84.0 (H) 88.4 - 84.4 %   Platelets 229 150 - 400 K/uL   nRBC 0.0 0.0 - 0.2 %    Comment: Performed at [facility] [address] [place], [place] [address]  Hepatic function panel     Status: Abnormal   Collection Time: 03/22/24  5:43 AM  Result Value Ref Range   Total Protein 4.3 (L) 6.5 - 8.1 g/dL   Albumin 2.3 (L) 3.5 - 5.0 g/dL   AST 48 (H) 15 - 41 U/L   ALT 143 (H) 0 - 44 U/L   Alkaline Phosphatase 87 38 - 126 U/L   Total Bilirubin 0.9 0.0 - 1.2 mg/dL   Bilirubin, Direct 0.1  0.0 - 0.2 mg/dL   Indirect Bilirubin 0.8 0.3 - 0.9 mg/dL    Comment: Performed at [facility], [address], [place], [place] [address]    Radiology No results found.  Assessment/Plan  Critical limb ischemia of left lower extremity (HCC) Her ABIs today are 1.05 on the right and  1.11 on the left with triphasic waveforms consistent with successful revascularization of the left lower extremity and maintain perfusion currently.  Significant reperfusion swelling seems to be improving.  Overall doing well after revascularization.  Would continue current medications including aspirin , Plavix , and statin agent.  Follow-up in 3 to 4 months with noninvasive studies.  Essential hypertension, benign blood pressure control important in reducing the progression of atherosclerotic disease. On appropriate oral medications.     COPD with acute exacerbation (HCC) On oxygen .  Poor candidate for general anesthesia or intubation at any point.   HYPERCHOLESTEROLEMIA lipid control important in reducing the progression of atherosclerotic disease. Continue statin therapy    [name], MD  04/28/2024 1:02 PM    This note was created with Dragon medical transcription system.  Any errors from dictation are purely unintentional

## 2024-04-29 LAB — VAS US ABI WITH/WO TBI
Left ABI: 1.11
Right ABI: 1.05

## 2024-04-30 ENCOUNTER — Ambulatory Visit (INDEPENDENT_AMBULATORY_CARE_PROVIDER_SITE_OTHER)

## 2024-04-30 DIAGNOSIS — J441 Chronic obstructive pulmonary disease with (acute) exacerbation: Secondary | ICD-10-CM | POA: Diagnosis not present

## 2024-04-30 DIAGNOSIS — Z23 Encounter for immunization: Secondary | ICD-10-CM

## 2024-04-30 DIAGNOSIS — J189 Pneumonia, unspecified organism: Secondary | ICD-10-CM | POA: Diagnosis not present

## 2024-04-30 DIAGNOSIS — S51011D Laceration without foreign body of right elbow, subsequent encounter: Secondary | ICD-10-CM | POA: Diagnosis not present

## 2024-04-30 DIAGNOSIS — J9621 Acute and chronic respiratory failure with hypoxia: Secondary | ICD-10-CM | POA: Diagnosis not present

## 2024-04-30 DIAGNOSIS — S81011D Laceration without foreign body, right knee, subsequent encounter: Secondary | ICD-10-CM | POA: Diagnosis not present

## 2024-04-30 DIAGNOSIS — J470 Bronchiectasis with acute lower respiratory infection: Secondary | ICD-10-CM | POA: Diagnosis not present

## 2024-04-30 DIAGNOSIS — J439 Emphysema, unspecified: Secondary | ICD-10-CM | POA: Diagnosis not present

## 2024-04-30 DIAGNOSIS — M800AXD Age-related osteoporosis with current pathological fracture, other site, subsequent encounter for fracture with routine healing: Secondary | ICD-10-CM | POA: Diagnosis not present

## 2024-04-30 DIAGNOSIS — J440 Chronic obstructive pulmonary disease with acute lower respiratory infection: Secondary | ICD-10-CM | POA: Diagnosis not present

## 2024-05-01 ENCOUNTER — Other Ambulatory Visit: Payer: Self-pay

## 2024-05-01 DIAGNOSIS — J441 Chronic obstructive pulmonary disease with (acute) exacerbation: Secondary | ICD-10-CM | POA: Diagnosis not present

## 2024-05-01 DIAGNOSIS — M800AXD Age-related osteoporosis with current pathological fracture, other site, subsequent encounter for fracture with routine healing: Secondary | ICD-10-CM | POA: Diagnosis not present

## 2024-05-01 DIAGNOSIS — S81011D Laceration without foreign body, right knee, subsequent encounter: Secondary | ICD-10-CM | POA: Diagnosis not present

## 2024-05-01 DIAGNOSIS — J439 Emphysema, unspecified: Secondary | ICD-10-CM | POA: Diagnosis not present

## 2024-05-01 DIAGNOSIS — J9621 Acute and chronic respiratory failure with hypoxia: Secondary | ICD-10-CM | POA: Diagnosis not present

## 2024-05-01 DIAGNOSIS — S51011D Laceration without foreign body of right elbow, subsequent encounter: Secondary | ICD-10-CM | POA: Diagnosis not present

## 2024-05-01 DIAGNOSIS — J189 Pneumonia, unspecified organism: Secondary | ICD-10-CM | POA: Diagnosis not present

## 2024-05-01 DIAGNOSIS — J470 Bronchiectasis with acute lower respiratory infection: Secondary | ICD-10-CM | POA: Diagnosis not present

## 2024-05-01 DIAGNOSIS — J440 Chronic obstructive pulmonary disease with acute lower respiratory infection: Secondary | ICD-10-CM | POA: Diagnosis not present

## 2024-05-02 NOTE — Patient Outreach (Signed)
 Complex Care Management   Visit Note  05/02/2024  Name:  Misty Ferguson MRN: [id number] DOB: 05-Oct-1933  Situation: Referral received for Complex Care Management related to COPD and Falls I obtained verbal consent from Patient.  Visit completed with Patient  on the phone  Background:   Past Medical History:  Diagnosis Date   AK (actinic keratosis) 12/08/2020   right pretibia inferior bx proven, LN2 01/10/21   Burping    Chronic airway obstruction, not elsewhere classified    Dyspnea    Dysrhythmia    Macular degeneration (senile) of retina, unspecified    Malignant neoplasm of urinary bladder (HCC) 03/2019   Partial bladder resection and Rad tx's.    Obstructive chronic bronchitis with exacerbation (HCC)    Osteoporosis, unspecified    Oxygen  deficiency    Personal history of peptic ulcer disease    SCC (squamous cell carcinoma) 12/08/2020   right pretibia superior, EDC 01/10/21   SCC (squamous cell carcinoma) 07/03/2022   left medial calf, clear 09/25/22   SCC (squamous cell carcinoma) 11/22/2022   right thigh   treated with ED&C   Squamous cell carcinoma in situ (SCCIS) 07/03/2022   right pretibia sccis needs treatment with fluorouracil    Tobacco use disorder    Unspecified essential hypertension    Unspecified glaucoma(365.9)     Assessment: Patient Reported Symptoms:  Cognitive Cognitive Status: Alert and oriented to person, place, and time, Insightful and able to interpret abstract concepts, Normal speech and language skills   Health Maintenance Behaviors: Annual physical exam, Hobbies, Healthy diet Healing Pattern: Average Health Facilitated by: Healthy diet  Neurological Neurological Review of Symptoms: Numbness Neurological Comment: PVD - neuropathy  HEENT   HEENT Management Strategies: Routine screening HEENT Comment: legally blind    Cardiovascular Cardiovascular Symptoms Reported: Swelling in legs or feet Does patient have uncontrolled Hypertension?:  No Cardiovascular Management Strategies: Medication therapy, Routine screening Weight: 88 lb (39.9 kg) Cardiovascular Comment: PVD Swelling in legs and feet normal but improved since procedure, compression hose  Respiratory Respiratory Symptoms Reported: Productive cough, Shortness of breath, Wheezing Other Respiratory Symptoms: O2 2-3 L, PO2 97-98%, Smart Vest BID as directed, breathing exercises, not able to expectorate, Respiratory Management Strategies: Routine screening, Oxygen  therapy, Breathing exercise  Endocrine Is patient diabetic?: No    Gastrointestinal Additional Gastrointestinal Details: miralax  and senna managing constipation, losing weight but always hungry and eats a lot but exertion to breathe      Genitourinary Genitourinary Symptoms Reported: No symptoms reported Additional Genitourinary Details: UTI sx reviewed    Integumentary Other Integumentary Symptoms: right knee wound almost closed, pressure sores on buttocks almost completely healed Skin Management Strategies: Routine screening, Dressing changes  Musculoskeletal Musculoskelatal Symptoms Reviewed: Weakness Additional Musculoskeletal Details: receiving PT Musculoskeletal Management Strategies: Exercise, Routine screening, Medication therapy Falls in the past year?: Yes Number of falls in past year: 2 or more Was there an injury with Fall?: Yes Fall Risk Category Calculator: 3 Patient Fall Risk Level: High Fall Risk Patient at Risk for Falls Due to: History of fall(s), Impaired balance/gait, Impaired vision, Impaired mobility Fall risk Follow up: Falls evaluation completed, Falls prevention discussed, Education provided  Psychosocial Psychosocial Symptoms Reported: No symptoms reported Additional Psychological Details: bored at times since cannot see to read or watch TV   Techniques to Cope with Loss/Stress/Change: Spiritual practice(s) Quality of Family Relationships: helpful, involved, supportive Do you feel  physically threatened by others?: No    05/02/2024    PHQ2-9 Depression Screening  Little interest or pleasure in doing things Not at all  Feeling down, depressed, or hopeless Not at all  PHQ-2 - Total Score 0  Trouble falling or staying asleep, or sleeping too much    Feeling tired or having little energy    Poor appetite or overeating     Feeling bad about yourself - or that you are a failure or have let yourself or your family down    Trouble concentrating on things, such as reading the newspaper or watching television    Moving or speaking so slowly that other people could have noticed.  Or the opposite - being so fidgety or restless that you have been moving around a lot more than usual    Thoughts that you would be better off dead, or hurting yourself in some way    PHQ2-9 Total Score    If you checked off any problems, how difficult have these problems made it for you to do your work, take care of things at home, or get along with other people    Depression Interventions/Treatment      Vitals:   05/01/24 1438  BP: 110/70  Pulse: 87  SpO2: 92%    Medications Reviewed Today     Reviewed by Devra Lands, RN (Registered Nurse) on 05/01/24 at 1424  Med List Status: <None>   Medication Order Taking? Sig Documenting Provider Last Dose Status Informant  Acetylcysteine (NAC 600 PO) [id number] Yes Take by mouth. [provider]  Active Self, Spouse/Significant Other, Pharmacy Records  albuterol  (PROVENTIL ) (2.5 MG/3ML) 0.083% nebulizer solution [id number] Yes INHALE THE CONTENTS OF 1 VIAL VIA NEBULIZER EVERY 4 HOURS AS NEEDED FOR WHEEZING OR SHORTNESS OF BREATH. Kasa, Kurian, MD  Active Self, Spouse/Significant Other, Pharmacy Records  albuterol  (VENTOLIN  HFA) [phone] Base) MCG/ACT inhaler [id number] Yes Inhale 2 puffs into the lungs every 6 (six) hours as needed for wheezing or shortness of breath. Kasa, Kurian, MD  Active Self, Spouse/Significant Other, Pharmacy Records   ascorbic acid (VITAMIN C) 1000 MG tablet [id number] Yes Take 1,000 mg by mouth daily.  [provider]  Active Self, Spouse/Significant Other, Pharmacy Records  aspirin  EC 81 MG tablet [id number] Yes Take 1 tablet (81 mg total) by mouth daily. Swallow whole. Marea Selinda RAMAN, MD  Active Self, Spouse/Significant Other, Pharmacy Records  atorvastatin  (LIPITOR) 10 MG tablet [id number] Yes TAKE 1 TABLET EVERY DAY Dew, Jason S, MD  Active Self, Spouse/Significant Other, Pharmacy Records  Azelastine  HCl 137 MCG/SPRAY SOLN [id number] Yes Place 2 sprays into both nostrils 2 (two) times daily. Kasa, Kurian, MD  Active Self, Spouse/Significant Other, Pharmacy Records  budesonide -glycopyrrolate -formoterol  (BREZTRI  AEROSPHERE) 160-9-4.8 MCG/ACT AERO inhaler [id number] Yes Inhale 2 puffs into the lungs in the morning and at bedtime. Kasa, Kurian, MD  Active Self, Spouse/Significant Other, Pharmacy Records  budesonide -glycopyrrolate -formoterol  (BREZTRI  AEROSPHERE) 160-9-4.8 MCG/ACT AERO inhaler [id number]  Inhale 2 puffs into the lungs 2 (two) times daily. Avelina Greig BRAVO, MD  Active Self, Spouse/Significant Other, Pharmacy Records  Cholecalciferol  (VITAMIN D ) 125 MCG (5000 UT) CAPS [id number] Yes Take 5,000 Units by mouth daily. [provider]  Active Self, Spouse/Significant Other, Pharmacy Records  clopidogrel  (PLAVIX ) 75 MG tablet [id number] Yes TAKE 1 TABLET EVERY DAY Dew, Jason S, MD  Active Self, Spouse/Significant Other, Pharmacy Records  Coenzyme Q10 (COQ10) 200 MG CAPS [id number] Yes Take by mouth. [provider]  Active Self, Spouse/Significant Other, Pharmacy Records  collagenase  (SANTYL ) 250 UNIT/GM ointment [id number]  Apply 1 Application topically daily.  Patient not taking: Reported on 04/28/2024   Avelina Greig BRAVO, MD  Active   furosemide  (LASIX ) 20 MG tablet [id number]  Take 1 tablet (20 mg total) by mouth daily for 7 days.  Patient not taking: Reported on 04/28/2024   Marea Selinda RAMAN, MD   Expired 04/22/24 2359   guaiFENesin  (MUCINEX ) 600 MG 12 hr tablet [id number] Yes Take 600 mg by mouth 2 (two) times daily. [provider]  Active Spouse/Significant Other, Self, Pharmacy Records           Med Note RAYNE ARLEY HERO   Sat Mar 14, 2024  3:49 PM) PRN  levofloxacin  (LEVAQUIN ) 750 MG tablet [id number]  Take 1 tablet (750 mg total) by mouth daily. 7 days  Patient not taking: Reported on 04/28/2024   Kasa, Kurian, MD  Active   melatonin 5 MG TABS [id number] Yes Take 1 tablet (5 mg total) by mouth at bedtime. Laurita Pillion, MD  Active   Multiple Vitamins-Minerals (PRESERVISION AREDS) TABS [id number]  Take 1 tablet by mouth 2 (two) times daily. [provider]  Active Self, Spouse/Significant Other, Pharmacy Records  OXYGEN  [id number] Yes Inhale 4 L into the lungs daily in the afternoon.  Patient taking differently: Inhale 4 L into the lungs daily in the afternoon. States worsening SOB and HHC nurse told them to increase from 2 to 3L   [provider]  Active Self, Spouse/Significant Other, Pharmacy Records  pantoprazole  (PROTONIX ) 40 MG tablet [id number]  Take 1 tablet (40 mg total) by mouth daily.  Patient not taking: Reported on 04/28/2024   Laurita Pillion, MD  Active   [facility] Sprint Nebulizer Set MISC [id number] Yes  [provider]  Active Self, Spouse/Significant Other, Pharmacy Records  polyethylene glycol (MIRALAX  / GLYCOLAX ) 17 g packet [id number] Yes Take 17 g by mouth daily. Laurita Pillion, MD  Active   predniSONE  (DELTASONE ) 10 MG tablet [id number]  Take 4 tablets (40 mg total) by mouth daily with breakfast. 10 days  Patient not taking: Reported on 04/28/2024   Isaiah Scrivener, MD  Active   senna-docusate (SENOKOT-S) 8.6-50 MG tablet [id number] Yes Take 1 tablet by mouth 2 (two) times daily as needed for mild constipation. Laurita Pillion, MD  Active   traMADol  (ULTRAM ) 50 MG tablet [id number]  Take 50 mg by mouth every 6 (six) hours as needed for moderate pain  (pain score 4-6) or severe pain (pain score 7-10).  Patient not taking: Reported on 04/28/2024   [provider]  Active   traZODone  (DESYREL ) 50 MG tablet [id number] Yes Take 0.5-1 tablets (25-50 mg total) by mouth at bedtime as needed for sleep. [name], Amy E, MD  Active   verapamil  (VERELAN  PM) 360 MG 24 hr capsule [id number] Yes Take 360 mg by mouth daily. [provider]  Active Self, Spouse/Significant Other, Pharmacy Records  Vitamin E 180 MG CAPS [id number] Yes Take 180 mg by mouth daily. [provider]  Active Self, Spouse/Significant Other, Pharmacy Records  Wound Dressings [facility] 4X4) PADS [id number]  Apply to knee ulcer.  Patient not taking: Reported on 04/28/2024   Avelina Greig BRAVO, MD  Active             Recommendation:   PCP Follow-up Continue Current Plan of Care  Follow Up Plan:   Telephone follow-up 2 [place]  Nestora Duos, MSN, RN [facility]  [facility], [facility] RN Care Manager Direct Dial: [phone] Fax: [phone]

## 2024-05-02 NOTE — Patient Instructions (Signed)
 Visit Information  Thank you for taking time to visit with me today. Please don't hesitate to contact me if I can be of assistance to you before our next scheduled appointment.  Our next appointment is by telephone on 05/15/2024 at 2:00 pm Please call the care guide team at [phone] if you need to cancel or reschedule your appointment.   Following is a copy of your care plan:   Goals Addressed             This Visit's Progress    [facility] RN Care Plan - COPD/Falls       Problems:  Chronic Disease Management support and education needs related to COPD and falls  Goal: Over the next 90 days the Patient will attend all scheduled medical appointments: patient will attend all appointments as evidenced by chart review        continue to work with RN Care Manager and/or Social Worker to address care management and care coordination needs related to COPD as evidenced by adherence to care management team scheduled appointments     take all medications exactly as prescribed and will call provider for medication related questions as evidenced by patient reporting compliance with medications    verbalize understanding of plan for management of COPD and Falls as evidenced by taking all medications as ordered, wearing O2 as prescribed, using walker at all times, changing position slowly, monitoring PO2 (goal 88-95%) and reporting reading repeatedly below 88% per provider guidelines, contacting provider for sx of respiratory illness and for any falls.   Interventions:   COPD Interventions: Advised patient to track and manage COPD triggers Advised patient to self assesses COPD action plan zone and make appointment with provider if in the yellow zone for 48 hours without improvement Discussed the importance of adequate rest and management of fatigue with COPD Provided education about and advised patient to utilize infection prevention strategies to reduce risk of respiratory infection Provided  instruction about proper use of medications used for management of COPD including inhalers Provided patient with basic written and verbal COPD education on self care/management/and exacerbation prevention Provided written and verbal instructions on pursed lip breathing and utilized returned demonstration as teach back Screening for signs and symptoms of depression related to chronic disease state  Use of home oxygen    Falls Interventions: Provided written and verbal education re: potential causes of falls and Fall prevention strategies Reviewed medications and discussed potential side effects of medications such as dizziness and frequent urination Advised patient of importance of notifying provider of falls Assessed for signs and symptoms of orthostatic hypotension Assessed for falls since last encounter Assessed patients knowledge of fall risk prevention secondary to previously provided education  Patient Self-Care Activities:  Attend all scheduled provider appointments Call pharmacy for medication refills 3-7 days in advance of running out of medications Call provider office for new concerns or questions  Take medications as prescribed   Work with the social worker to address care coordination needs and will continue to work with the clinical team to address health care and disease management related needs do breathing exercises every day eliminate symptom triggers at home follow rescue plan if symptoms flare-up keep follow-up appointments:   eat healthy/prescribed diet: increase protein, calorie dense foods, add supplements as needed (boost) to balance COPD related calorie consumption with intake get at least 7 to 8 hours of sleep at night do breathing exercises every day Practice energy conservation  Plan:  Telephone follow up appointment with care  management team member scheduled for:  05/15/2024 at 2:00 pm             Please call the Suicide and Crisis Lifeline:  988 call the [place]  National Suicide Prevention Lifeline: 754-327-4932 or TTY: 603-048-9154 TTY 808-719-9597) to talk to a trained counselor call 1-800-273-TALK (toll free, 24 hour hotline) go to [facility] [address], [place] [phone]) call 911 if you are experiencing a Mental Health or Behavioral Health Crisis or need someone to talk to.  Patient verbalizes understanding of instructions and care plan provided today and agrees to view in MyChart. Active MyChart status and patient understanding of how to access instructions and care plan via MyChart confirmed with patient.     Misty Duos, MSN, RN [place]  [facility], [facility] RN Care Manager Direct Dial: [phone] Fax: [phone]  COPD Action Plan A COPD action plan is a description of what to do when you have a flare (exacerbation) of chronic obstructive pulmonary disease (COPD). Your action plan is a color-coded plan that lists the symptoms that indicate whether your condition is under control and what actions to take. If you have symptoms in the green zone, it means you are doing well that day. If you have symptoms in the yellow zone, it means you are having a bad day or an exacerbation. If you have symptoms in the red zone, you need urgent medical care. Follow the plan that you and your health care provider developed. Review your plan with your health care provider at each visit. Red zone Symptoms in this zone mean that you should get medical help right away. They include: Feeling very short of breath, even when you are resting. Not being able to do any activities because of poor breathing. Not being able to sleep because of poor breathing. Fever or shaking chills. Feeling confused or very sleepy. Chest pain. Coughing up blood. If you have any of these symptoms, call emergency services (911 in the [place] or go to the nearest emergency  room. Yellow zone Symptoms in this zone mean that your condition may be getting worse. They include: Feeling more short of breath than usual. Having less energy for daily activities than usual. Phlegm or mucus that is thicker than usual. Needing to use your rescue inhaler or nebulizer more often than usual. More ankle swelling than usual. Coughing more than usual. Feeling like you have a chest cold. Trouble sleeping due to COPD symptoms. Decreased appetite. COPD medicines not helping as much as usual. If you experience any yellow symptoms: Keep taking your daily medicines as directed. Use your quick-relief inhaler as told by your health care provider. If you were prescribed steroid medicine to take by mouth (oral medicine), start taking it as told by your health care provider. If you were prescribed an antibiotic medicine, start taking it as told by your health care provider. Do not stop taking the antibiotic even if you start to feel better. Use oxygen  as told by your health care provider. Get more rest. Do your pursed-lip breathing exercises. Do not smoke. Avoid any irritants in the air. If your signs and symptoms do not improve after taking these steps, call your health care provider right away. Green zone Symptoms in this zone mean that you are doing well. They include: Being able to do your usual activities and exercise. Having the usual amount of coughing, including the same amount of phlegm or mucus. Being able to sleep well. Having  a good appetite. Where to find more information: You can find more information about COPD from: American Lung Association, My COPD Action Plan: www.lung.org COPD Foundation: www.copdfoundation.org National Heart, Lung, & Blood Institute: PopSteam.is Follow these instructions at home: Continue taking your daily medicines as told by your health care provider. Make sure you receive all the immunizations that your health care provider  recommends, especially the pneumococcal and influenza vaccines. Wash your hands often with soap and water. Have family members wash their hands too. Regular hand washing can help prevent infections. Follow your usual exercise and diet plan. Avoid irritants in the air, such as smoke. Do not use any products that contain nicotine or tobacco. These products include cigarettes, chewing tobacco, and vaping devices, such as e-cigarettes. If you need help quitting, ask your health care provider. Summary A COPD action plan tells you what to do when you have a flare (exacerbation) of chronic obstructive pulmonary disease (COPD). Follow each action plan for your symptoms. If you have any symptoms in the red zone, call emergency services (911 in the [place] or go to the nearest emergency room. This information is not intended to replace advice given to you by your health care provider. Make sure you discuss any questions you have with your health care provider. Document Revised: 06/13/2023 Document Reviewed: 06/13/2023 Elsevier Patient Education  2024 [employer].  Fall Prevention in the Home, Adult Falls can cause injuries and affect people of all ages. There are many simple things that you can do to make your home safe and to help prevent falls. If you need it, ask for help making these changes. What actions can I take to prevent falls? General information Use good lighting in all rooms. Make sure to: Replace any light bulbs that burn out. Turn on lights if it is dark and use night-lights. Keep items that you use often in easy-to-reach places. Lower the shelves around your home if needed. Move furniture so that there are clear paths around it. Do not keep throw rugs or other things on the floor that can make you trip. If any of your floors are uneven, fix them. Add color or contrast paint or tape to clearly mark and help you see: Grab bars or handrails. First and last steps of staircases. Where the  edge of each step is. If you use a ladder or stepladder: Make sure that it is fully opened. Do not climb a closed ladder. Make sure the sides of the ladder are locked in place. Have someone hold the ladder while you use it. Know where your pets are as you move through your home. What can I do in the bathroom?     Keep the floor dry. Clean up any water that is on the floor right away. Remove soap buildup in the bathtub or shower. Buildup makes bathtubs and showers slippery. Use non-skid mats or decals on the floor of the bathtub or shower. Attach bath mats securely with double-sided, non-slip rug tape. If you need to sit down while you are in the shower, use a non-slip stool. Install grab bars by the toilet and in the bathtub and shower. Do not use towel bars as grab bars. What can I do in the bedroom? Make sure that you have a light by your bed that is easy to reach. Do not use any sheets or blankets on your bed that hang to the floor. Have a firm bench or chair with side arms that you can use for  support when you get dressed. What can I do in the kitchen? Clean up any spills right away. If you need to reach something above you, use a sturdy step stool that has a grab bar. Keep electrical cables out of the way. Do not use floor polish or wax that makes floors slippery. What can I do with my stairs? Do not leave anything on the stairs. Make sure that you have a light switch at the top and the bottom of the stairs. Have them installed if you do not have them. Make sure that there are handrails on both sides of the stairs. Fix handrails that are broken or loose. Make sure that handrails are as long as the staircases. Install non-slip stair treads on all stairs in your home if they do not have carpet. Avoid having throw rugs at the top or bottom of stairs, or secure the rugs with carpet tape to prevent them from moving. Choose a carpet design that does not hide the edge of steps on the  stairs. Make sure that carpet is firmly attached to the stairs. Fix any carpet that is loose or worn. What can I do on the outside of my home? Use bright outdoor lighting. Repair the edges of walkways and driveways and fix any cracks. Clear paths of anything that can make you trip, such as tools or rocks. Add color or contrast paint or tape to clearly mark and help you see high doorway thresholds. Trim any bushes or trees on the main path into your home. Check that handrails are securely fastened and in good repair. Both sides of all steps should have handrails. Install guardrails along the edges of any raised decks or porches. Have leaves, snow, and ice cleared regularly. Use sand, salt, or ice melt on walkways during winter months if you live where there is ice and snow. In the garage, clean up any spills right away, including grease or oil spills. What other actions can I take? Review your medicines with your health care provider. Some medicines can make you confused or feel dizzy. This can increase your chance of falling. Wear closed-toe shoes that fit well and support your feet. Wear shoes that have rubber soles and low heels. Use a cane, walker, scooter, or crutches that help you move around if needed. Talk with your provider about other ways that you can decrease your risk of falls. This may include seeing a physical therapist to learn to do exercises to improve movement and strength. Where to find more information Centers for Disease Control and Prevention, STEADI: TonerPromos.no [employer] on Aging: BaseRingTones.pl National Institute on Aging: BaseRingTones.pl Contact a health care provider if: You are afraid of falling at home. You feel weak, drowsy, or dizzy at home. You fall at home. Get help right away if you: Lose consciousness or have trouble moving after a fall. Have a fall that causes a head injury. These symptoms may be an emergency. Get help right away. Call 911. Do not wait to  see if the symptoms will go away. Do not drive yourself to the hospital. This information is not intended to replace advice given to you by your health care provider. Make sure you discuss any questions you have with your health care provider. Document Revised: 04/02/2022 Document Reviewed: 04/02/2022 Elsevier Patient Education  2024 [employer].   Energy Conservation Techniques  Sit for as many activities as possible. Use slow, smooth movements.  Rushing increases discomfort. Determine the necessity of performing the  task.  Simplify those tasks that are necessary.  (Get clothes out of the dryer when they are warm instead of ironing, let dishes air dry, etc.) Take frequent rests both during and between activities.  Avoid repetitive tasks. Pre-plan your activities; try a daily and/or weekly schedule.  Spread out the activities that are most fatiguing (break up cleaning tasks over multiple days). Remember to plan a balance of work, rest and recreation. Consider the best time for each activity.  Do the most exertive task when you have the most energy. Don't carry items if you can push them.  Slide, don't lift. Push, don't pull. Utilize two hands when appropriate. Maintain good posture and use proper body mechanics. Avoid remaining in one position for too long. When lifting, bend at the knees, not at the waist.  Exhale when bending down, inhale when straightening up. Carry objects as close to your body and as near to the center of the pelvis.  11. Avoid wasted body movements (position yourself for the task so that you avoid bending, twisting, etc.                when possible). 12. Select the best working environment.  Consider lighting, ventilation, clothing, and equipment. 13. Organize your storage areas, making the items you use daily convenient.  Store heaviest items at waist            height.  Store frequently used items between shoulders and knee height.  Consider leaving frequently  used       items on countertops.  (You can organize in storage baskets based on time used/purpose). 14. Feelings and emotions can be real causes of fatigue.  Try to avoid unnecessary worry, irritation, or                    frustration.  Avoid stress, it can also be a source of fatigue. 15. Get help from other people for difficult tasks. 16. Explore equipment or items that may be able to do the job for you with greater ease.  (Electric can        openers, blenders, lightweight items for cleaning, etc.)

## 2024-05-05 ENCOUNTER — Ambulatory Visit: Admitting: Internal Medicine

## 2024-05-06 ENCOUNTER — Ambulatory Visit: Admitting: Urology

## 2024-05-06 ENCOUNTER — Ambulatory Visit: Admitting: Internal Medicine

## 2024-05-06 ENCOUNTER — Ambulatory Visit: Admitting: Physician Assistant

## 2024-05-06 VITALS — BP 147/74 | HR 73 | Wt 89.0 lb

## 2024-05-06 DIAGNOSIS — C679 Malignant neoplasm of bladder, unspecified: Secondary | ICD-10-CM

## 2024-05-06 NOTE — Progress Notes (Signed)
   05/06/2024 1:46 PM   Misty Ferguson [birth year] [id number]  Reason for visit: Follow up bladder cancer, gross hematuria  History: Frail 89 year old female with COPD on oxygen , originally presented September 2020 with gross hematuria, found to have 3 cm bladder tumor left hydronephrosis with AKI and GFR 33.  Underwent TURBT October 2020 with resection of all visible disease, path high-grade T2 urothelial cell carcinoma with squamous differentiation and stent placed.  Renal function improved.  Stent was ultimately removed.  She underwent radiation but deferred chemotherapy.  No recurrence since that time. Gross hematuria and clot retention January 2025 felt to be related to radiation cystitis, she deferred cystoscopy with her frailty but cytology was negative.  Not a candidate for hyperbaric oxygen  with her severe COPD. She has deferred surveillance cystoscopy with her ongoing health decline  Physical Exam: BP (!) 147/74 (BP Location: Left Arm, Patient Position: Sitting, Cuff Size: Normal)   Pulse 73   Wt 89 lb (40.4 kg)   SpO2 94%   BMI 17.98 kg/m   Imaging/labs: Reviewed extensively in epic, normal renal function I personally viewed and interpreted the recent CT abdomen and pelvis 03/15/2024 showing normal-appearing bladder, no hydronephrosis  Today: Recent hospitalization for vascular procedure and small bowel obstruction, was transfused at that time She denies any gross hematuria, dysuria, or urinary symptoms  Plan:   With her ongoing health issues, she prefers more of a watchful waiting approach which I think is reasonable.  Return precautions discussed including gross hematuria, I have an appointment with her husband in a few months and will check on how things are going   Redell JAYSON Burnet, MD  [facility] [address], [address] [place], [place] [address] [phone]

## 2024-05-07 ENCOUNTER — Ambulatory Visit: Payer: Self-pay | Admitting: Urology

## 2024-05-07 DIAGNOSIS — J440 Chronic obstructive pulmonary disease with acute lower respiratory infection: Secondary | ICD-10-CM | POA: Diagnosis not present

## 2024-05-07 DIAGNOSIS — J9621 Acute and chronic respiratory failure with hypoxia: Secondary | ICD-10-CM | POA: Diagnosis not present

## 2024-05-07 DIAGNOSIS — S51011D Laceration without foreign body of right elbow, subsequent encounter: Secondary | ICD-10-CM | POA: Diagnosis not present

## 2024-05-07 DIAGNOSIS — J470 Bronchiectasis with acute lower respiratory infection: Secondary | ICD-10-CM | POA: Diagnosis not present

## 2024-05-07 DIAGNOSIS — M800AXD Age-related osteoporosis with current pathological fracture, other site, subsequent encounter for fracture with routine healing: Secondary | ICD-10-CM | POA: Diagnosis not present

## 2024-05-07 DIAGNOSIS — J441 Chronic obstructive pulmonary disease with (acute) exacerbation: Secondary | ICD-10-CM | POA: Diagnosis not present

## 2024-05-07 DIAGNOSIS — S81011D Laceration without foreign body, right knee, subsequent encounter: Secondary | ICD-10-CM | POA: Diagnosis not present

## 2024-05-07 DIAGNOSIS — J439 Emphysema, unspecified: Secondary | ICD-10-CM | POA: Diagnosis not present

## 2024-05-07 DIAGNOSIS — J189 Pneumonia, unspecified organism: Secondary | ICD-10-CM | POA: Diagnosis not present

## 2024-05-08 DIAGNOSIS — J440 Chronic obstructive pulmonary disease with acute lower respiratory infection: Secondary | ICD-10-CM | POA: Diagnosis not present

## 2024-05-08 DIAGNOSIS — M800AXD Age-related osteoporosis with current pathological fracture, other site, subsequent encounter for fracture with routine healing: Secondary | ICD-10-CM | POA: Diagnosis not present

## 2024-05-08 DIAGNOSIS — J189 Pneumonia, unspecified organism: Secondary | ICD-10-CM | POA: Diagnosis not present

## 2024-05-08 DIAGNOSIS — J9621 Acute and chronic respiratory failure with hypoxia: Secondary | ICD-10-CM | POA: Diagnosis not present

## 2024-05-08 DIAGNOSIS — J439 Emphysema, unspecified: Secondary | ICD-10-CM | POA: Diagnosis not present

## 2024-05-08 DIAGNOSIS — S81011D Laceration without foreign body, right knee, subsequent encounter: Secondary | ICD-10-CM | POA: Diagnosis not present

## 2024-05-08 DIAGNOSIS — J441 Chronic obstructive pulmonary disease with (acute) exacerbation: Secondary | ICD-10-CM | POA: Diagnosis not present

## 2024-05-08 DIAGNOSIS — S51011D Laceration without foreign body of right elbow, subsequent encounter: Secondary | ICD-10-CM | POA: Diagnosis not present

## 2024-05-08 DIAGNOSIS — J470 Bronchiectasis with acute lower respiratory infection: Secondary | ICD-10-CM | POA: Diagnosis not present

## 2024-05-13 DIAGNOSIS — J470 Bronchiectasis with acute lower respiratory infection: Secondary | ICD-10-CM | POA: Diagnosis not present

## 2024-05-13 DIAGNOSIS — J189 Pneumonia, unspecified organism: Secondary | ICD-10-CM | POA: Diagnosis not present

## 2024-05-13 DIAGNOSIS — J441 Chronic obstructive pulmonary disease with (acute) exacerbation: Secondary | ICD-10-CM | POA: Diagnosis not present

## 2024-05-13 DIAGNOSIS — J439 Emphysema, unspecified: Secondary | ICD-10-CM | POA: Diagnosis not present

## 2024-05-13 DIAGNOSIS — S81011D Laceration without foreign body, right knee, subsequent encounter: Secondary | ICD-10-CM | POA: Diagnosis not present

## 2024-05-13 DIAGNOSIS — J440 Chronic obstructive pulmonary disease with acute lower respiratory infection: Secondary | ICD-10-CM | POA: Diagnosis not present

## 2024-05-13 DIAGNOSIS — J9621 Acute and chronic respiratory failure with hypoxia: Secondary | ICD-10-CM | POA: Diagnosis not present

## 2024-05-13 DIAGNOSIS — S51011D Laceration without foreign body of right elbow, subsequent encounter: Secondary | ICD-10-CM | POA: Diagnosis not present

## 2024-05-13 DIAGNOSIS — M800AXD Age-related osteoporosis with current pathological fracture, other site, subsequent encounter for fracture with routine healing: Secondary | ICD-10-CM | POA: Diagnosis not present

## 2024-05-15 ENCOUNTER — Other Ambulatory Visit: Payer: Self-pay

## 2024-05-15 DIAGNOSIS — J440 Chronic obstructive pulmonary disease with acute lower respiratory infection: Secondary | ICD-10-CM | POA: Diagnosis not present

## 2024-05-15 NOTE — Patient Outreach (Signed)
 Complex Care Management   Visit Note  05/15/2024  Name:  Misty Ferguson MRN: [id number] DOB: Jul 10, 1934  Situation: Referral received for Complex Care Management related to COPD and Falls I obtained verbal consent from Patient.  Visit completed with Patient  on the phone  Background:   Past Medical History:  Diagnosis Date   AK (actinic keratosis) 12/08/2020   right pretibia inferior bx proven, LN2 01/10/21   Burping    Chronic airway obstruction, not elsewhere classified    Dyspnea    Dysrhythmia    Macular degeneration (senile) of retina, unspecified    Malignant neoplasm of urinary bladder (HCC) 03/2019   Partial bladder resection and Rad tx's.    Obstructive chronic bronchitis with exacerbation (HCC)    Osteoporosis, unspecified    Oxygen  deficiency    Personal history of peptic ulcer disease    SCC (squamous cell carcinoma) 12/08/2020   right pretibia superior, EDC 01/10/21   SCC (squamous cell carcinoma) 07/03/2022   left medial calf, clear 09/25/22   SCC (squamous cell carcinoma) 11/22/2022   right thigh   treated with ED&C   Squamous cell carcinoma in situ (SCCIS) 07/03/2022   right pretibia sccis needs treatment with fluorouracil    Tobacco use disorder    Unspecified essential hypertension    Unspecified glaucoma(365.9)     Assessment: Patient Reported Symptoms:  Cognitive Cognitive Status: No symptoms reported      Neurological Neurological Review of Symptoms: Numbness    HEENT   HEENT Comment: LEGALLY BLIND    Cardiovascular Cardiovascular Symptoms Reported: Swelling in legs or feet Cardiovascular Management Strategies: Medication therapy, Routine screening Weight: 91 lb (41.3 kg)  Respiratory Respiratory Symptoms Reported: Shortness of breath, Dry cough, Wheezing Additional Respiratory Details: no changes - current PO2 97% on O22L and SOB, states due to talking and no worse than usual, discussed Palliative Care and sending via mail, encouraged breathing  exercises, sending prior ed info by mail as discussed Respiratory Management Strategies: Breathing exercise, Oxygen  therapy, Routine screening Respiratory Self-Management Outcome: 3 (uncertain)  Endocrine      Gastrointestinal Gastrointestinal Symptoms Reported: No symptoms reported      Genitourinary Genitourinary Symptoms Reported: No symptoms reported    Integumentary Integumentary Symptoms Reported: No symptoms reported Other Integumentary Symptoms: right knee healed    Musculoskeletal Musculoskelatal Symptoms Reviewed: Weakness Additional Musculoskeletal Details: PT   Falls in the past year?: Yes Number of falls in past year: 2 or more Was there an injury with Fall?: Yes Fall Risk Category Calculator: 3 Patient Fall Risk Level: High Fall Risk Patient at Risk for Falls Due to: History of fall(s), Impaired balance/gait, Impaired mobility Fall risk Follow up: Falls evaluation completed, Falls prevention discussed  Psychosocial Psychosocial Symptoms Reported: No symptoms reported          05/15/2024    PHQ2-9 Depression Screening   Little interest or pleasure in doing things Not at all  Feeling down, depressed, or hopeless Not at all  PHQ-2 - Total Score 0  Trouble falling or staying asleep, or sleeping too much    Feeling tired or having little energy    Poor appetite or overeating     Feeling bad about yourself - or that you are a failure or have let yourself or your family down    Trouble concentrating on things, such as reading the newspaper or watching television    Moving or speaking so slowly that other people could have noticed.  Or the opposite -  being so fidgety or restless that you have been moving around a lot more than usual    Thoughts that you would be better off dead, or hurting yourself in some way    PHQ2-9 Total Score    If you checked off any problems, how difficult have these problems made it for you to do your work, take care of things at home, or get  along with other people    Depression Interventions/Treatment      There were no vitals filed for this visit.  Medications Reviewed Today     Reviewed by Devra Lands, RN (Registered Nurse) on 05/15/24 at 1419  Med List Status: <None>   Medication Order Taking? Sig Documenting Provider Last Dose Status Informant  Acetylcysteine (NAC 600 PO) [id number]  Take by mouth. [provider]  Active Self, Spouse/Significant Other, Pharmacy Records  albuterol  (PROVENTIL ) (2.5 MG/3ML) 0.083% nebulizer solution [id number]  INHALE THE CONTENTS OF 1 VIAL VIA NEBULIZER EVERY 4 HOURS AS NEEDED FOR WHEEZING OR SHORTNESS OF BREATH. Kasa, Kurian, MD  Active Self, Spouse/Significant Other, Pharmacy Records  albuterol  (VENTOLIN  HFA) [phone] Base) MCG/ACT inhaler [id number]  Inhale 2 puffs into the lungs every 6 (six) hours as needed for wheezing or shortness of breath. Kasa, Kurian, MD  Active Self, Spouse/Significant Other, Pharmacy Records  ascorbic acid (VITAMIN C) 1000 MG tablet [id number]  Take 1,000 mg by mouth daily.  [provider]  Active Self, Spouse/Significant Other, Pharmacy Records  aspirin  EC 81 MG tablet [id number]  Take 1 tablet (81 mg total) by mouth daily. Swallow whole. Marea Selinda RAMAN, MD  Active Self, Spouse/Significant Other, Pharmacy Records  atorvastatin  (LIPITOR) 10 MG tablet [id number]  TAKE 1 TABLET EVERY DAY Dew, Jason S, MD  Active Self, Spouse/Significant Other, Pharmacy Records  Azelastine  HCl 137 MCG/SPRAY SOLN [id number]  Place 2 sprays into both nostrils 2 (two) times daily. Kasa, Kurian, MD  Active Self, Spouse/Significant Other, Pharmacy Records  budesonide -glycopyrrolate -formoterol  (BREZTRI  AEROSPHERE) 160-9-4.8 MCG/ACT AERO inhaler [id number]  Inhale 2 puffs into the lungs in the morning and at bedtime. Kasa, Kurian, MD  Active Self, Spouse/Significant Other, Pharmacy Records  budesonide -glycopyrrolate -formoterol  (BREZTRI  AEROSPHERE) 160-9-4.8 MCG/ACT AERO inhaler  [id number]  Inhale 2 puffs into the lungs 2 (two) times daily. Avelina Greig BRAVO, MD  Active Self, Spouse/Significant Other, Pharmacy Records  Cholecalciferol  (VITAMIN D ) 125 MCG (5000 UT) CAPS [id number]  Take 5,000 Units by mouth daily. [provider]  Active Self, Spouse/Significant Other, Pharmacy Records  clopidogrel  (PLAVIX ) 75 MG tablet [id number]  TAKE 1 TABLET EVERY DAY Dew, Jason S, MD  Active Self, Spouse/Significant Other, Pharmacy Records  Coenzyme Q10 (COQ10) 200 MG CAPS [id number]  Take by mouth. [provider]  Active Self, Spouse/Significant Other, Pharmacy Records  collagenase  (SANTYL ) 250 UNIT/GM ointment [id number]  Apply 1 Application topically daily.  Patient not taking: Reported on 05/06/2024   Avelina Greig BRAVO, MD  Active   guaiFENesin  (MUCINEX ) 600 MG 12 hr tablet [id number]  Take 600 mg by mouth 2 (two) times daily. [provider]  Active Spouse/Significant Other, Self, Pharmacy Records           Med Note RAYNE ARLEY HERO   Sat Mar 14, 2024  3:49 PM) PRN  losartan  (COZAAR ) 25 MG tablet [id number]  Take 25 mg by mouth daily. [provider]  Active   melatonin 5 MG TABS [id number]  Take 1 tablet (5 mg total) by mouth at bedtime. Laurita Pillion, MD  Active   Multiple  Vitamins-Minerals (PRESERVISION AREDS) TABS [id number]  Take 1 tablet by mouth 2 (two) times daily. [provider]  Active Self, Spouse/Significant Other, Pharmacy Records  OXYGEN  [id number]  Inhale 4 L into the lungs daily in the afternoon.  Patient taking differently: Inhale 4 L into the lungs daily in the afternoon. States worsening SOB and HHC nurse told them to increase from 2 to 3L   [provider]  Active Self, Spouse/Significant Other, Pharmacy Records  pantoprazole  (PROTONIX ) 40 MG tablet [id number]  Take 1 tablet (40 mg total) by mouth daily.  Patient not taking: Reported on 05/06/2024   Laurita Pillion, MD  Active   [facility] Sprint Nebulizer Set MISC [id number]    [provider]  Active Self, Spouse/Significant Other, Pharmacy Records  polyethylene glycol (MIRALAX  / GLYCOLAX ) 17 g packet [id number]  Take 17 g by mouth daily. Laurita Pillion, MD  Active   predniSONE  (DELTASONE ) 10 MG tablet [id number]  Take 4 tablets (40 mg total) by mouth daily with breakfast. 10 days Isaiah Scrivener, MD  Active   senna-docusate (SENOKOT-S) 8.6-50 MG tablet [id number]  Take 1 tablet by mouth 2 (two) times daily as needed for mild constipation. Laurita Pillion, MD  Active   traMADol  (ULTRAM ) 50 MG tablet [id number]  Take 50 mg by mouth every 6 (six) hours as needed for moderate pain (pain score 4-6) or severe pain (pain score 7-10).  Patient not taking: Reported on 05/06/2024   [provider]  Active   traZODone  (DESYREL ) 50 MG tablet [id number]  Take 0.5-1 tablets (25-50 mg total) by mouth at bedtime as needed for sleep. [name], Amy E, MD  Active   verapamil  (VERELAN  PM) 360 MG 24 hr capsule [id number]  Take 360 mg by mouth daily. [provider]  Active Self, Spouse/Significant Other, Pharmacy Records  Vitamin E 180 MG CAPS [id number]  Take 180 mg by mouth daily. [provider]  Active Self, Spouse/Significant Other, Pharmacy Records  Wound Dressings [facility] 4X4) PADS [id number]  Apply to knee ulcer.  Patient not taking: Reported on 05/06/2024   Avelina Greig BRAVO, MD  Active             Recommendation:   PCP Follow-up Continue Current Plan of Care  Follow Up Plan:   Telephone follow-up in 1 month  Nestora Duos, MSN, RN [facility]  [facility], [facility] RN Care Manager Direct Dial: [phone] Fax: [phone]

## 2024-05-15 NOTE — Patient Instructions (Addendum)
 Visit Information  Thank you for taking time to visit with me today. Please don't hesitate to contact me if I can be of assistance to you before our next scheduled appointment.  Your next care management appointment is by telephone on 10/29 25 at 11:00 am  Telephone follow-up in 1 week  Please call the care guide team at [phone] if you need to cancel, schedule, or reschedule an appointment.   Please call the Suicide and Crisis Lifeline: 988 call the [place]  National Suicide Prevention Lifeline: 279-196-3596 or TTY: (646)368-9553 TTY 618 750 9740) to talk to a trained counselor call 1-800-273-TALK (toll free, 24 hour hotline) go to [facility] [address] [place] [phone]) call 911 if you are experiencing a Mental Health or Behavioral Health Crisis or need someone to talk to.  Nestora Duos, MSN, RN [facility], [facility] RN Care Manager Direct Dial: [phone] Fax: [phone]   Energy Conservation Techniques  Sit for as many activities as possible. Use slow, smooth movements.  Rushing increases discomfort. Determine the necessity of performing the task.  Simplify those tasks that are necessary.  (Get clothes out of the dryer when they are warm instead of ironing, let dishes air dry, etc.) Take frequent rests both during and between activities.  Avoid repetitive tasks. Pre-plan your activities; try a daily and/or weekly schedule.  Spread out the activities that are most fatiguing (break up cleaning tasks over multiple days). Remember to plan a balance of work, rest and recreation. Consider the best time for each activity.  Do the most exertive task when you have the most energy. Don't carry items if you can push them.  Slide, don't lift. Push, don't pull. Utilize two hands when appropriate. Maintain good posture and use proper body mechanics. Avoid remaining in one position for too  long. When lifting, bend at the knees, not at the waist.  Exhale when bending down, inhale when straightening up. Carry objects as close to your body and as near to the center of the pelvis.  11. Avoid wasted body movements (position yourself for the task so that you avoid bending, twisting, etc.                when possible). 12. Select the best working environment.  Consider lighting, ventilation, clothing, and equipment. 13. Organize your storage areas, making the items you use daily convenient.  Store heaviest items at waist            height.  Store frequently used items between shoulders and knee height.  Consider leaving frequently used       items on countertops.  (You can organize in storage baskets based on time used/purpose). 14. Feelings and emotions can be real causes of fatigue.  Try to avoid unnecessary worry, irritation, or                    frustration.  Avoid stress, it can also be a source of fatigue. 15. Get help from other people for difficult tasks. 16. Explore equipment or items that may be able to do the job for you with greater ease.  (Electric can        openers, blenders, lightweight items for cleaning, etc.)   Fall Prevention in the Home, Adult Falls can cause injuries and can happen to people of all ages. There are many things you can do to make your home safer and to help prevent falls. What actions can I  take to prevent falls? General information Use good lighting in all rooms. Make sure to: Replace any light bulbs that burn out. Turn on the lights in dark areas and use night-lights. Keep items that you use often in easy-to-reach places. Lower the shelves around your home if needed. Move furniture so that there are clear paths around it. Do not use throw rugs or other things on the floor that can make you trip. If any of your floors are uneven, fix them. Add color or contrast paint or tape to clearly mark and help you see: Grab bars or handrails. First and  last steps of staircases. Where the edge of each step is. If you use a ladder or stepladder: Make sure that it is fully opened. Do not climb a closed ladder. Make sure the sides of the ladder are locked in place. Have someone hold the ladder while you use it. Know where your pets are as you move through your home. What can I do in the bathroom?     Keep the floor dry. Clean up any water on the floor right away. Remove soap buildup in the bathtub or shower. Buildup makes bathtubs and showers slippery. Use non-skid mats or decals on the floor of the bathtub or shower. Attach bath mats securely with double-sided, non-slip rug tape. If you need to sit down in the shower, use a non-slip stool. Install grab bars by the toilet and in the bathtub and shower. Do not use towel bars as grab bars. What can I do in the bedroom? Make sure that you have a light by your bed that is easy to reach. Do not use any sheets or blankets on your bed that hang to the floor. Have a firm chair or bench with side arms that you can use for support when you get dressed. What can I do in the kitchen? Clean up any spills right away. If you need to reach something above you, use a step stool with a grab bar. Keep electrical cords out of the way. Do not use floor polish or wax that makes floors slippery. What can I do with my stairs? Do not leave anything on the stairs. Make sure that you have a light switch at the top and the bottom of the stairs. Make sure that there are handrails on both sides of the stairs. Fix handrails that are broken or loose. Install non-slip stair treads on all your stairs if they do not have carpet. Avoid having throw rugs at the top or bottom of the stairs. Choose a carpet that does not hide the edge of the steps on the stairs. Make sure that the carpet is firmly attached to the stairs. Fix carpet that is loose or worn. What can I do on the outside of my home? Use bright outdoor  lighting. Fix the edges of walkways and driveways and fix any cracks. Clear paths of anything that can make you trip, such as tools or rocks. Add color or contrast paint or tape to clearly mark and help you see anything that might make you trip as you walk through a door, such as a raised step or threshold. Trim any bushes or trees on paths to your home. Check to see if handrails are loose or broken and that both sides of all steps have handrails. Install guardrails along the edges of any raised decks and porches. Have leaves, snow, or ice cleared regularly. Use sand, salt, or ice melter on paths if  you live where there is ice and snow during the winter. Clean up any spills in your garage right away. This includes grease or oil spills. What other actions can I take? Review your medicines with your doctor. Some medicines can cause dizziness or changes in blood pressure, which increase your risk of falling. Wear shoes that: Have a low heel. Do not wear high heels. Have rubber bottoms and are closed at the toe. Feel good on your feet and fit well. Use tools that help you move around if needed. These include: Canes. Walkers. Scooters. Crutches. Ask your doctor what else you can do to help prevent falls. This may include seeing a physical therapist to learn to do exercises to move better and get stronger. Where to find more information Centers for Disease Control and Prevention, STEADI: TonerPromos.no [employer] on Aging: BaseRingTones.pl National Institute on Aging: BaseRingTones.pl Contact a doctor if: You are afraid of falling at home. You feel weak, drowsy, or dizzy at home. You fall at home. Get help right away if you: Lose consciousness or have trouble moving after a fall. Have a fall that causes a head injury. These symptoms may be an emergency. Get help right away. Call 911. Do not wait to see if the symptoms will go away. Do not drive yourself to the hospital. This information is not  intended to replace advice given to you by your health care provider. Make sure you discuss any questions you have with your health care provider. Document Revised: 04/02/2022 Document Reviewed: 04/02/2022 Elsevier Patient Education  2024 Elsevier Inc.   Palliative Care Palliative care can help make your quality of life better if you have a very serious illness. It involves care of your body, mind, and spirit. It's based on what you need and want in this stage of life. In many cases, care may take place in a hospital or long-term care setting. It may include: Help with pain and other symptoms. Family support. Spiritual support. Emotional support. Social support. Palliative care can help bring you comfort and peace of mind. It can be helpful to you and your family during the course of an illness. What is the difference between palliative care and hospice? Palliative care and hospice have similar goals. They both aim to: Help with symptoms. Provide comfort. Make your quality of life better. Maintain your dignity. They're different in that palliative care can be given: At the same time as other treatments. During any phase of a serious illness, from diagnosis to cure. Hospice care is given when: You're thought to have 6 months or less to live. You no longer can, or want to, try to find a cure for your illness. Who can get palliative care services? Palliative care is given to children and adults who are very ill. It may be offered if: You're not responding well to treatment. You need help with pain. You have side effects from treatment that are hard to manage. You have symptoms from the effects of surgery. You have been diagnosed with a disease that: Is advanced. Will shorten your life. Your health care provider may talk with you about palliative care if they think the support would help you. Your family and friends may also get help to manage stress and other concerns. Who makes up  the palliative care team? The care team includes: You and your family. Your providers and specialists. Nurses. A Child psychotherapist, psychologist, or psychiatrist. Based on your needs, the team may also include: A pain specialist.  A hospice specialist. Someone to help with finances or insurance. Religious or spiritual leaders. A case Production designer, theatre/television/film. A grief counselor. How will the palliative care team help me and my family? The team will speak with you and your family about: Your symptoms, such as: Pain. Nausea. Vomiting. Shortness of breath. The need for specialists. Advance directives. These may include: Living wills. Health care proxies. Symptoms that have to do with your mental health, such as: Stress. Depression. This is when you feel sad or hopeless. Anxiety. This is when you feel worried or nervous. Treatment options and how to keep as much as possible of your: Function. Ability to move around. Spiritual wishes, such as: Rituals. Prayer. Things you can do to leave a legacy or make memories. Life and death as a normal process. End-of-life care. The team can also help you talk about: Hard issues. Spiritual concerns. Emotional concerns. Where to find more information [employer] on Aging (NIA): BaseRingTones.pl National Hospice and Palliative Care Organization [facility]): https://rodriguez-phillips.com/ This information is not intended to replace advice given to you by your health care provider. Make sure you discuss any questions you have with your health care provider. Document Revised: 12/17/2022 Document Reviewed: 12/17/2022 Elsevier Patient Education  2024 Elsevier Inc.  COPD Action Plan A COPD action plan is a description of what to do when you have a flare (exacerbation) of chronic obstructive pulmonary disease (COPD). Your action plan is a color-coded plan that lists the symptoms that indicate whether your condition is under control and what actions to take. If you have symptoms in the  green zone, it means you are doing well that day. If you have symptoms in the yellow zone, it means you are having a bad day or an exacerbation. If you have symptoms in the red zone, you need urgent medical care. Follow the plan that you and your health care provider developed. Review your plan with your health care provider at each visit. Red zone Symptoms in this zone mean that you should get medical help right away. They include: Feeling very short of breath, even when you are resting. Not being able to do any activities because of poor breathing. Not being able to sleep because of poor breathing. Fever or shaking chills. Feeling confused or very sleepy. Chest pain. Coughing up blood. If you have any of these symptoms, call emergency services (911 in the [place] or go to the nearest emergency room. Yellow zone Symptoms in this zone mean that your condition may be getting worse. They include: Feeling more short of breath than usual. Having less energy for daily activities than usual. Phlegm or mucus that is thicker than usual. Needing to use your rescue inhaler or nebulizer more often than usual. More ankle swelling than usual. Coughing more than usual. Feeling like you have a chest cold. Trouble sleeping due to COPD symptoms. Decreased appetite. COPD medicines not helping as much as usual. If you experience any yellow symptoms: Keep taking your daily medicines as directed. Use your quick-relief inhaler as told by your health care provider. If you were prescribed steroid medicine to take by mouth (oral medicine), start taking it as told by your health care provider. If you were prescribed an antibiotic medicine, start taking it as told by your health care provider. Do not stop taking the antibiotic even if you start to feel better. Use oxygen  as told by your health care provider. Get more rest. Do your pursed-lip breathing exercises. Do not smoke. Avoid any irritants in  the  air. If your signs and symptoms do not improve after taking these steps, call your health care provider right away. Green zone Symptoms in this zone mean that you are doing well. They include: Being able to do your usual activities and exercise. Having the usual amount of coughing, including the same amount of phlegm or mucus. Being able to sleep well. Having a good appetite. Where to find more information: You can find more information about COPD from: American Lung Association, My COPD Action Plan: www.lung.org COPD Foundation: www.copdfoundation.org National Heart, Lung, & Blood Institute: PopSteam.is Follow these instructions at home: Continue taking your daily medicines as told by your health care provider. Make sure you receive all the immunizations that your health care provider recommends, especially the pneumococcal and influenza vaccines. Wash your hands often with soap and water. Have family members wash their hands too. Regular hand washing can help prevent infections. Follow your usual exercise and diet plan. Avoid irritants in the air, such as smoke. Do not use any products that contain nicotine or tobacco. These products include cigarettes, chewing tobacco, and vaping devices, such as e-cigarettes. If you need help quitting, ask your health care provider. Summary A COPD action plan tells you what to do when you have a flare (exacerbation) of chronic obstructive pulmonary disease (COPD). Follow each action plan for your symptoms. If you have any symptoms in the red zone, call emergency services (911 in the [place] or go to the nearest emergency room. This information is not intended to replace advice given to you by your health care provider. Make sure you discuss any questions you have with your health care provider. Document Revised: 06/13/2023 Document Reviewed: 06/13/2023 Elsevier Patient Education  2024 Elsevier Inc.Chronic Obstructive Pulmonary Disease  Chronic  obstructive pulmonary disease (COPD) is a long-term (chronic) lung problem. When you have COPD, it can feel harder to breathe in or out. The condition may get worse over time. There are things you can do to keep yourself as healthy as possible. What are the causes? Smoking. This is the most common cause. Breathing in fumes, smoke, or chemicals for a long time. Genes that are inherited, which means they are passed down from parent to child. What are the signs or symptoms? Shortness of breath. This may happen all the time. This may get worse when you move your body. This may get worse over time. You may have times when this becomes much worse all of a sudden. These are called flare-ups or exacerbations. A long-term cough, with or without thick mucus. Wheezing. Chest tightness. Feeling tired. Not being able to do activities like you used to do. How is this diagnosed? This condition is diagnosed based on: Your medical history. A physical exam. Lung (pulmonary) function tests. You may have a test that measures the air flow out of the lungs when you breathe out. You may also have tests, including: Chest X-ray. CT scan. Blood tests. How is this treated? This condition may be treated by: Quitting smoking, if you smoke. Using oxygen . Taking medicines. These may include: Inhalers. These have medicines in them that you breathe in. Daily inhalers. These help to prevent symptoms from happening. They are usually taken every day to prevent COPD flare-ups. Quick relief inhalers. These act fast to relieve symptoms. They are used only when needed and provide short-term relief. Other medicines that you breathe in or swallow. These may be used to open the airways, thin mucus, or treat infections. Breathing exercises to help  you control or catch your breath. A mucus clearing device, if you have a lot of thick mucus. Pulmonary rehab. A place where you will learn about your condition and the best ways  for you to manage it. Surgery. Follow these instructions at home: Medicines Take your medicines as told by your health care provider. Talk to your provider before taking any cough or allergy medicines. You may need to avoid medicines that cause your lungs to be dry. Lifestyle Several times a day, wash your hands with soap and water for at least 20 seconds. If you cannot use soap and water, use hand sanitizer. This may help keep you from getting an infection. Avoid being around crowds or people who are sick. Do not smoke or use any products that contain nicotine or tobacco. If you need help quitting, ask your provider. Stay active. Learn how to pace your activity during the day. Learn how to breathe to control your stress and catch your breath. Drink enough fluid to keep your pee (urine) pale yellow, unless you have been told not to. Eat healthy foods. Eat smaller meals more often. Get enough sleep. Most adults need 7 or more hours per night. General instructions Make a COPD action plan with your provider. This helps you to know what to do if you feel worse than usual. Make sure you get all the shots, also called vaccines, that your provider recommends. Ask your provider about a flu shot and a pneumonia shot. If you need home oxygen  therapy, ask your provider how often to check your oxygen  level with a device called an oximeter. Keep all follow-up visits to review your COPD action plan. Your provider will want to check on your condition often to keep you healthy and out of the hospital. Contact a health care provider if: You are coughing up more mucus than usual. There is a change in the color or thickness of the mucus. It is harder to breathe than usual or you are short of breath while you are resting. You need to use your quick relief inhaler more often. You have trouble doing your normal activities such as getting dressed or walking in the house. Your skin color or fingernails turn  blue. You have a fever or chills. Get help right away if: You are short of breath and cannot: Talk in full sentences. Do normal activities. You have chest pain. You feel confused. These symptoms may be an emergency. Call 911 right away. Do not wait to see if the symptoms will go away. Do not drive yourself to the hospital. This information is not intended to replace advice given to you by your health care provider. Make sure you discuss any questions you have with your health care provider. Document Revised: 05/02/2023 Document Reviewed: 10/15/2022 Elsevier Patient Education  2024 [employer].

## 2024-05-18 ENCOUNTER — Telehealth: Payer: Self-pay

## 2024-05-18 NOTE — Telephone Encounter (Signed)
 Gave pt a call pt is coming up for reenrollemnt on [employer] ,spoke with pt and is ware she will be receiving PAP in the mail.faxed provider portion today.

## 2024-05-19 DIAGNOSIS — J479 Bronchiectasis, uncomplicated: Secondary | ICD-10-CM | POA: Diagnosis not present

## 2024-05-20 DIAGNOSIS — J189 Pneumonia, unspecified organism: Secondary | ICD-10-CM | POA: Diagnosis not present

## 2024-05-20 DIAGNOSIS — S81011D Laceration without foreign body, right knee, subsequent encounter: Secondary | ICD-10-CM | POA: Diagnosis not present

## 2024-05-20 DIAGNOSIS — M800AXD Age-related osteoporosis with current pathological fracture, other site, subsequent encounter for fracture with routine healing: Secondary | ICD-10-CM | POA: Diagnosis not present

## 2024-05-20 DIAGNOSIS — J439 Emphysema, unspecified: Secondary | ICD-10-CM | POA: Diagnosis not present

## 2024-05-20 DIAGNOSIS — J440 Chronic obstructive pulmonary disease with acute lower respiratory infection: Secondary | ICD-10-CM | POA: Diagnosis not present

## 2024-05-20 DIAGNOSIS — J441 Chronic obstructive pulmonary disease with (acute) exacerbation: Secondary | ICD-10-CM | POA: Diagnosis not present

## 2024-05-20 DIAGNOSIS — J470 Bronchiectasis with acute lower respiratory infection: Secondary | ICD-10-CM | POA: Diagnosis not present

## 2024-05-20 DIAGNOSIS — J9621 Acute and chronic respiratory failure with hypoxia: Secondary | ICD-10-CM | POA: Diagnosis not present

## 2024-05-20 DIAGNOSIS — S51011D Laceration without foreign body of right elbow, subsequent encounter: Secondary | ICD-10-CM | POA: Diagnosis not present

## 2024-05-21 DIAGNOSIS — J189 Pneumonia, unspecified organism: Secondary | ICD-10-CM | POA: Diagnosis not present

## 2024-05-25 ENCOUNTER — Encounter: Payer: Self-pay | Admitting: Internal Medicine

## 2024-05-26 DIAGNOSIS — M5031 Other cervical disc degeneration,  high cervical region: Secondary | ICD-10-CM | POA: Diagnosis not present

## 2024-05-26 DIAGNOSIS — J9611 Chronic respiratory failure with hypoxia: Secondary | ICD-10-CM | POA: Diagnosis not present

## 2024-05-26 DIAGNOSIS — I10 Essential (primary) hypertension: Secondary | ICD-10-CM | POA: Diagnosis not present

## 2024-05-26 DIAGNOSIS — M47812 Spondylosis without myelopathy or radiculopathy, cervical region: Secondary | ICD-10-CM | POA: Diagnosis not present

## 2024-05-26 DIAGNOSIS — I70222 Atherosclerosis of native arteries of extremities with rest pain, left leg: Secondary | ICD-10-CM | POA: Diagnosis not present

## 2024-05-26 DIAGNOSIS — I700 Atherosclerosis of aorta: Secondary | ICD-10-CM | POA: Diagnosis not present

## 2024-05-26 DIAGNOSIS — M800AXD Age-related osteoporosis with current pathological fracture, other site, subsequent encounter for fracture with routine healing: Secondary | ICD-10-CM | POA: Diagnosis not present

## 2024-05-26 DIAGNOSIS — J479 Bronchiectasis, uncomplicated: Secondary | ICD-10-CM | POA: Diagnosis not present

## 2024-05-26 DIAGNOSIS — J439 Emphysema, unspecified: Secondary | ICD-10-CM | POA: Diagnosis not present

## 2024-05-27 NOTE — Telephone Encounter (Signed)
 Received provider portion [employer] Doreen back from provider office today.

## 2024-05-28 DIAGNOSIS — J479 Bronchiectasis, uncomplicated: Secondary | ICD-10-CM | POA: Diagnosis not present

## 2024-05-28 DIAGNOSIS — M5031 Other cervical disc degeneration,  high cervical region: Secondary | ICD-10-CM | POA: Diagnosis not present

## 2024-05-28 DIAGNOSIS — J439 Emphysema, unspecified: Secondary | ICD-10-CM | POA: Diagnosis not present

## 2024-05-28 DIAGNOSIS — I10 Essential (primary) hypertension: Secondary | ICD-10-CM | POA: Diagnosis not present

## 2024-05-28 DIAGNOSIS — I70222 Atherosclerosis of native arteries of extremities with rest pain, left leg: Secondary | ICD-10-CM | POA: Diagnosis not present

## 2024-05-28 DIAGNOSIS — I700 Atherosclerosis of aorta: Secondary | ICD-10-CM | POA: Diagnosis not present

## 2024-05-28 DIAGNOSIS — M800AXD Age-related osteoporosis with current pathological fracture, other site, subsequent encounter for fracture with routine healing: Secondary | ICD-10-CM | POA: Diagnosis not present

## 2024-05-28 DIAGNOSIS — J9611 Chronic respiratory failure with hypoxia: Secondary | ICD-10-CM | POA: Diagnosis not present

## 2024-06-01 ENCOUNTER — Other Ambulatory Visit (HOSPITAL_COMMUNITY): Payer: Self-pay

## 2024-06-01 NOTE — Telephone Encounter (Signed)
 Received pt portion [employer]  faxed to [employer] along provider portion today.

## 2024-06-03 ENCOUNTER — Encounter: Payer: Self-pay | Admitting: Internal Medicine

## 2024-06-03 DIAGNOSIS — M47812 Spondylosis without myelopathy or radiculopathy, cervical region: Secondary | ICD-10-CM | POA: Diagnosis not present

## 2024-06-03 DIAGNOSIS — J9611 Chronic respiratory failure with hypoxia: Secondary | ICD-10-CM | POA: Diagnosis not present

## 2024-06-03 DIAGNOSIS — I700 Atherosclerosis of aorta: Secondary | ICD-10-CM | POA: Diagnosis not present

## 2024-06-03 DIAGNOSIS — J439 Emphysema, unspecified: Secondary | ICD-10-CM | POA: Diagnosis not present

## 2024-06-03 DIAGNOSIS — J479 Bronchiectasis, uncomplicated: Secondary | ICD-10-CM | POA: Diagnosis not present

## 2024-06-03 DIAGNOSIS — M5031 Other cervical disc degeneration,  high cervical region: Secondary | ICD-10-CM | POA: Diagnosis not present

## 2024-06-03 DIAGNOSIS — I70222 Atherosclerosis of native arteries of extremities with rest pain, left leg: Secondary | ICD-10-CM | POA: Diagnosis not present

## 2024-06-03 DIAGNOSIS — M800AXD Age-related osteoporosis with current pathological fracture, other site, subsequent encounter for fracture with routine healing: Secondary | ICD-10-CM | POA: Diagnosis not present

## 2024-06-03 DIAGNOSIS — I10 Essential (primary) hypertension: Secondary | ICD-10-CM | POA: Diagnosis not present

## 2024-06-04 ENCOUNTER — Other Ambulatory Visit (HOSPITAL_COMMUNITY): Payer: Self-pay

## 2024-06-04 DIAGNOSIS — J9611 Chronic respiratory failure with hypoxia: Secondary | ICD-10-CM | POA: Diagnosis not present

## 2024-06-04 NOTE — Telephone Encounter (Signed)
 Received approval letter [employer] bre[employer]  approval letter index

## 2024-06-05 ENCOUNTER — Encounter: Payer: Self-pay | Admitting: Internal Medicine

## 2024-06-05 ENCOUNTER — Other Ambulatory Visit: Payer: Self-pay | Admitting: Internal Medicine

## 2024-06-05 DIAGNOSIS — J441 Chronic obstructive pulmonary disease with (acute) exacerbation: Secondary | ICD-10-CM

## 2024-06-05 MED ORDER — PREDNISONE 20 MG PO TABS: 20.0000 mg | ORAL_TABLET | Freq: Every day | ORAL | 1 refills | Status: DC

## 2024-06-05 NOTE — Progress Notes (Unsigned)
 For increased SOB, pred 10 mg daily for 7 days

## 2024-06-10 ENCOUNTER — Other Ambulatory Visit: Payer: Self-pay

## 2024-06-10 NOTE — Patient Outreach (Signed)
 Complex Care Management   Visit Note  06/10/2024  Name:  Misty Ferguson MRN: [id number] DOB: 1934-03-12  Situation: Referral received for Complex Care Management related to COPD and Falls I obtained verbal consent from Patient.  Visit completed with Patient  on the phone  Background:   Past Medical History:  Diagnosis Date   AK (actinic keratosis) 12/08/2020   right pretibia inferior bx proven, LN2 01/10/21   Burping    Chronic airway obstruction, not elsewhere classified    Dyspnea    Dysrhythmia    Macular degeneration (senile) of retina, unspecified    Malignant neoplasm of urinary bladder (HCC) 03/2019   Partial bladder resection and Rad tx's.    Obstructive chronic bronchitis with exacerbation (HCC)    Osteoporosis, unspecified    Oxygen  deficiency    Personal history of peptic ulcer disease    SCC (squamous cell carcinoma) 12/08/2020   right pretibia superior, EDC 01/10/21   SCC (squamous cell carcinoma) 07/03/2022   left medial calf, clear 09/25/22   SCC (squamous cell carcinoma) 11/22/2022   right thigh   treated with ED&C   Squamous cell carcinoma in situ (SCCIS) 07/03/2022   right pretibia sccis needs treatment with fluorouracil    Tobacco use disorder    Unspecified essential hypertension    Unspecified glaucoma(365.9)     Assessment: Patient Reported Symptoms:  Cognitive Cognitive Status: No symptoms reported Cognitive/Intellectual Conditions Management [RPT]: None reported or documented in medical history or problem list   Health Maintenance Behaviors: Immunizations, Annual physical exam  Neurological Neurological Review of Symptoms: No symptoms reported    HEENT HEENT Symptoms Reported: Other: HEENT Management Strategies: Routine screening HEENT Comment: Legally Blind    Cardiovascular Cardiovascular Symptoms Reported: No symptoms reported Cardiovascular Management Strategies: Medication therapy, Routine screening Weight: 92 lb (41.7 kg) Cardiovascular  Comment: compression hose for PVD  Respiratory Respiratory Symptoms Reported: Shortness of breath, Dry cough, Wheezing Other Respiratory Symptoms: O2 2-3L PO2 88-95% most of time - breathing exercises, currently on prednisone  20 mg x10 days Respiratory Management Strategies: Routine screening, Oxygen  therapy, Medication therapy, Breathing exercise  Endocrine Is patient diabetic?: No    Gastrointestinal Gastrointestinal Symptoms Reported: No symptoms reported Additional Gastrointestinal Details: miralax  daily - managed, appetite remains good weight stable      Genitourinary Genitourinary Symptoms Reported: No symptoms reported    Integumentary Integumentary Symptoms Reported: No symptoms reported    Musculoskeletal Musculoskelatal Symptoms Reviewed: Weakness, Limited mobility Additional Musculoskeletal Details: PT, walker no recent falls, activity limitd by breathing Musculoskeletal Management Strategies: Routine screening, Medication therapy, Exercise Falls in the past year?: Yes Number of falls in past year: 2 or more Was there an injury with Fall?: Yes Fall Risk Category Calculator: 3 Patient Fall Risk Level: High Fall Risk Patient at Risk for Falls Due to: History of fall(s), Impaired mobility Fall risk Follow up: Falls evaluation completed, Falls prevention discussed  Psychosocial            06/10/2024    PHQ2-9 Depression Screening   Little interest or pleasure in doing things Not at all  Feeling down, depressed, or hopeless Not at all  PHQ-2 - Total Score 0  Trouble falling or staying asleep, or sleeping too much    Feeling tired or having little energy    Poor appetite or overeating     Feeling bad about yourself - or that you are a failure or have let yourself or your family down    Trouble concentrating on things,  such as reading the newspaper or watching television    Moving or speaking so slowly that other people could have noticed.  Or the opposite - being so  fidgety or restless that you have been moving around a lot more than usual    Thoughts that you would be better off dead, or hurting yourself in some way    PHQ2-9 Total Score    If you checked off any problems, Ferguson difficult have these problems made it for you to do your work, take care of things at home, or get along with other people    Depression Interventions/Treatment      There were no vitals filed for this visit.  Medications Reviewed Today     Reviewed by Devra Lands, RN (Registered Nurse) on 06/10/24 at 1111  Med List Status: <None>   Medication Order Taking? Sig Documenting Provider Last Dose Status Informant  Acetylcysteine (NAC 600 PO) [id number]  Take by mouth. [provider]  Active Self, Spouse/Significant Other, Pharmacy Records  albuterol  (PROVENTIL ) (2.5 MG/3ML) 0.083% nebulizer solution [id number] Yes INHALE THE CONTENTS OF 1 VIAL VIA NEBULIZER EVERY 4 HOURS AS NEEDED FOR WHEEZING OR SHORTNESS OF BREATH. Kasa, Kurian, MD  Active Self, Spouse/Significant Other, Pharmacy Records  albuterol  (VENTOLIN  HFA) [phone] Base) MCG/ACT inhaler [id number] Yes Inhale 2 puffs into the lungs every 6 (six) hours as needed for wheezing or shortness of breath. Kasa, Kurian, MD  Active Self, Spouse/Significant Other, Pharmacy Records  ascorbic acid (VITAMIN C) 1000 MG tablet [id number]  Take 1,000 mg by mouth daily.  [provider]  Active Self, Spouse/Significant Other, Pharmacy Records  atorvastatin  (LIPITOR) 10 MG tablet [id number]  TAKE 1 TABLET EVERY DAY Dew, Jason S, MD  Active Self, Spouse/Significant Other, Pharmacy Records  Azelastine  HCl 137 MCG/SPRAY SOLN [id number]  Place 2 sprays into both nostrils 2 (two) times daily. Kasa, Kurian, MD  Active Self, Spouse/Significant Other, Pharmacy Records  budesonide -glycopyrrolate -formoterol  (BREZTRI  AEROSPHERE) 160-9-4.8 MCG/ACT AERO inhaler [id number] Yes Inhale 2 puffs into the lungs in the morning and at bedtime. Kasa,  Kurian, MD  Active Self, Spouse/Significant Other, Pharmacy Records  budesonide -glycopyrrolate -formoterol  (BREZTRI  AEROSPHERE) 160-9-4.8 MCG/ACT AERO inhaler [id number]  Inhale 2 puffs into the lungs 2 (two) times daily. Avelina Greig BRAVO, MD  Active Self, Spouse/Significant Other, Pharmacy Records  Cholecalciferol  (VITAMIN D ) 125 MCG (5000 UT) CAPS [id number]  Take 5,000 Units by mouth daily. [provider]  Active Self, Spouse/Significant Other, Pharmacy Records  clopidogrel  (PLAVIX ) 75 MG tablet [id number]  TAKE 1 TABLET EVERY DAY Dew, Jason S, MD  Active Self, Spouse/Significant Other, Pharmacy Records  Coenzyme Q10 (COQ10) 200 MG CAPS [id number]  Take by mouth. [provider]  Active Self, Spouse/Significant Other, Pharmacy Records  collagenase  (SANTYL ) 250 UNIT/GM ointment [id number]  Apply 1 Application topically daily.  Patient not taking: Reported on 05/06/2024   Avelina Greig BRAVO, MD  Active   guaiFENesin  (MUCINEX ) 600 MG 12 hr tablet [id number]  Take 600 mg by mouth 2 (two) times daily. [provider]  Active Spouse/Significant Other, Self, Pharmacy Records           Med Note RAYNE ARLEY HERO   Sat Mar 14, 2024  3:49 PM) PRN  losartan  (COZAAR ) 25 MG tablet [id number]  Take 25 mg by mouth daily. [provider]  Active   melatonin 5 MG TABS [id number]  Take 1 tablet (5 mg total) by mouth at bedtime. Laurita Pillion, MD  Active   Multiple Vitamins-Minerals (PRESERVISION AREDS)  TABS [id number]  Take 1 tablet by mouth 2 (two) times daily. [provider]  Active Self, Spouse/Significant Other, Pharmacy Records  OXYGEN  [id number] Yes Inhale 4 L into the lungs daily in the afternoon.  Patient taking differently: Inhale 4 L into the lungs daily in the afternoon. States worsening SOB and HHC nurse told them to increase from 2 to 3L  Currently O2 2-3L   [provider]  Active Self, Spouse/Significant Other, Pharmacy Records  pantoprazole  (PROTONIX ) 40 MG  tablet [id number]  Take 1 tablet (40 mg total) by mouth daily.  Patient not taking: Reported on 05/06/2024   Laurita Pillion, MD  Active   [facility] Sprint Nebulizer Set MISC [id number]   [provider]  Active Self, Spouse/Significant Other, Pharmacy Records  polyethylene glycol (MIRALAX  / GLYCOLAX ) 17 g packet [id number]  Take 17 g by mouth daily. Laurita Pillion, MD  Active   predniSONE  (DELTASONE ) 10 MG tablet [id number]  Take 4 tablets (40 mg total) by mouth daily with breakfast. 10 days  Patient not taking: Reported on 06/10/2024   Kasa, Kurian, MD  Active   predniSONE  (DELTASONE ) 20 MG tablet [id number] Yes Take 1 tablet (20 mg total) by mouth daily with breakfast. 10 days Isaiah Scrivener, MD  Active   senna-docusate (SENOKOT-S) 8.6-50 MG tablet [id number]  Take 1 tablet by mouth 2 (two) times daily as needed for mild constipation. Laurita Pillion, MD  Active   traMADol  (ULTRAM ) 50 MG tablet [id number]  Take 50 mg by mouth every 6 (six) hours as needed for moderate pain (pain score 4-6) or severe pain (pain score 7-10).  Patient not taking: Reported on 05/06/2024   [provider]  Active   traZODone  (DESYREL ) 50 MG tablet [id number]  Take 0.5-1 tablets (25-50 mg total) by mouth at bedtime as needed for sleep. [name], Amy E, MD  Active   verapamil  (VERELAN  PM) 360 MG 24 hr capsule [id number]  Take 360 mg by mouth daily. [provider]  Active Self, Spouse/Significant Other, Pharmacy Records  Vitamin E 180 MG CAPS [id number]  Take 180 mg by mouth daily. [provider]  Active Self, Spouse/Significant Other, Pharmacy Records  Wound Dressings [facility] 4X4) PADS [id number]  Apply to knee ulcer.  Patient not taking: Reported on 05/06/2024   Avelina Greig BRAVO, MD  Active             Recommendation:   PCP Follow-up Continue Current Plan of Care  Follow Up Plan:   Closing From:  Complex Care Management  Nestora Duos, MSN, RN [facility]  [facility], [facility] RN Care Manager Direct Dial: [phone] Fax: [phone]

## 2024-06-10 NOTE — Patient Instructions (Signed)
 Visit Information  Thank you for taking time to visit with me today. Please don't hesitate to contact me if I can be of assistance to you before our next scheduled appointment.  Your next care management appointment is no further scheduled appointments.    Closing From: Complex Care Management.  Please call the care guide team at [phone] if you need to cancel, schedule, or reschedule an appointment.   Please call the Suicide and Crisis Lifeline: 988 call the [place]  National Suicide Prevention Lifeline: 508 073 6807 or TTY: (915)023-5940 TTY 4407023144) to talk to a trained counselor call 1-800-273-TALK (toll free, 24 hour hotline) go to [facility] [address] [place] [phone]) call 911 if you are experiencing a Mental Health or Behavioral Health Crisis or need someone to talk to.   Nestora Duos, MSN, RN [facility], [facility] RN Care Manager Direct Dial: [phone] Fax: [phone]

## 2024-06-11 DIAGNOSIS — J9611 Chronic respiratory failure with hypoxia: Secondary | ICD-10-CM | POA: Diagnosis not present

## 2024-06-11 DIAGNOSIS — M5031 Other cervical disc degeneration,  high cervical region: Secondary | ICD-10-CM | POA: Diagnosis not present

## 2024-06-11 DIAGNOSIS — I700 Atherosclerosis of aorta: Secondary | ICD-10-CM | POA: Diagnosis not present

## 2024-06-11 DIAGNOSIS — J439 Emphysema, unspecified: Secondary | ICD-10-CM | POA: Diagnosis not present

## 2024-06-11 DIAGNOSIS — J479 Bronchiectasis, uncomplicated: Secondary | ICD-10-CM | POA: Diagnosis not present

## 2024-06-11 DIAGNOSIS — I70222 Atherosclerosis of native arteries of extremities with rest pain, left leg: Secondary | ICD-10-CM | POA: Diagnosis not present

## 2024-06-11 DIAGNOSIS — I10 Essential (primary) hypertension: Secondary | ICD-10-CM | POA: Diagnosis not present

## 2024-06-11 DIAGNOSIS — M47812 Spondylosis without myelopathy or radiculopathy, cervical region: Secondary | ICD-10-CM | POA: Diagnosis not present

## 2024-06-11 DIAGNOSIS — M800AXD Age-related osteoporosis with current pathological fracture, other site, subsequent encounter for fracture with routine healing: Secondary | ICD-10-CM | POA: Diagnosis not present

## 2024-06-12 DIAGNOSIS — I700 Atherosclerosis of aorta: Secondary | ICD-10-CM | POA: Diagnosis not present

## 2024-06-13 ENCOUNTER — Other Ambulatory Visit (INDEPENDENT_AMBULATORY_CARE_PROVIDER_SITE_OTHER): Payer: Self-pay | Admitting: Vascular Surgery

## 2024-06-15 DIAGNOSIS — I10 Essential (primary) hypertension: Secondary | ICD-10-CM | POA: Diagnosis not present

## 2024-06-15 DIAGNOSIS — J479 Bronchiectasis, uncomplicated: Secondary | ICD-10-CM | POA: Diagnosis not present

## 2024-06-15 DIAGNOSIS — J9611 Chronic respiratory failure with hypoxia: Secondary | ICD-10-CM | POA: Diagnosis not present

## 2024-06-15 DIAGNOSIS — I700 Atherosclerosis of aorta: Secondary | ICD-10-CM | POA: Diagnosis not present

## 2024-06-15 DIAGNOSIS — M800AXD Age-related osteoporosis with current pathological fracture, other site, subsequent encounter for fracture with routine healing: Secondary | ICD-10-CM | POA: Diagnosis not present

## 2024-06-15 DIAGNOSIS — M5031 Other cervical disc degeneration,  high cervical region: Secondary | ICD-10-CM | POA: Diagnosis not present

## 2024-06-15 DIAGNOSIS — J439 Emphysema, unspecified: Secondary | ICD-10-CM | POA: Diagnosis not present

## 2024-06-15 DIAGNOSIS — I70222 Atherosclerosis of native arteries of extremities with rest pain, left leg: Secondary | ICD-10-CM | POA: Diagnosis not present

## 2024-06-15 DIAGNOSIS — M47812 Spondylosis without myelopathy or radiculopathy, cervical region: Secondary | ICD-10-CM | POA: Diagnosis not present

## 2024-06-17 DIAGNOSIS — I700 Atherosclerosis of aorta: Secondary | ICD-10-CM | POA: Diagnosis not present

## 2024-06-17 DIAGNOSIS — I10 Essential (primary) hypertension: Secondary | ICD-10-CM | POA: Diagnosis not present

## 2024-06-17 DIAGNOSIS — J479 Bronchiectasis, uncomplicated: Secondary | ICD-10-CM | POA: Diagnosis not present

## 2024-06-17 DIAGNOSIS — M47812 Spondylosis without myelopathy or radiculopathy, cervical region: Secondary | ICD-10-CM | POA: Diagnosis not present

## 2024-06-17 DIAGNOSIS — J439 Emphysema, unspecified: Secondary | ICD-10-CM | POA: Diagnosis not present

## 2024-06-17 DIAGNOSIS — M5031 Other cervical disc degeneration,  high cervical region: Secondary | ICD-10-CM | POA: Diagnosis not present

## 2024-06-17 DIAGNOSIS — I70222 Atherosclerosis of native arteries of extremities with rest pain, left leg: Secondary | ICD-10-CM | POA: Diagnosis not present

## 2024-06-17 DIAGNOSIS — M800AXD Age-related osteoporosis with current pathological fracture, other site, subsequent encounter for fracture with routine healing: Secondary | ICD-10-CM | POA: Diagnosis not present

## 2024-06-17 DIAGNOSIS — J9611 Chronic respiratory failure with hypoxia: Secondary | ICD-10-CM | POA: Diagnosis not present

## 2024-06-26 DIAGNOSIS — J479 Bronchiectasis, uncomplicated: Secondary | ICD-10-CM | POA: Diagnosis not present

## 2024-06-26 DIAGNOSIS — J439 Emphysema, unspecified: Secondary | ICD-10-CM | POA: Diagnosis not present

## 2024-06-26 DIAGNOSIS — M5031 Other cervical disc degeneration,  high cervical region: Secondary | ICD-10-CM | POA: Diagnosis not present

## 2024-06-26 DIAGNOSIS — M47812 Spondylosis without myelopathy or radiculopathy, cervical region: Secondary | ICD-10-CM | POA: Diagnosis not present

## 2024-06-26 DIAGNOSIS — M800AXD Age-related osteoporosis with current pathological fracture, other site, subsequent encounter for fracture with routine healing: Secondary | ICD-10-CM | POA: Diagnosis not present

## 2024-06-26 DIAGNOSIS — I700 Atherosclerosis of aorta: Secondary | ICD-10-CM | POA: Diagnosis not present

## 2024-06-26 DIAGNOSIS — J9611 Chronic respiratory failure with hypoxia: Secondary | ICD-10-CM | POA: Diagnosis not present

## 2024-06-26 DIAGNOSIS — I10 Essential (primary) hypertension: Secondary | ICD-10-CM | POA: Diagnosis not present

## 2024-06-26 DIAGNOSIS — I70222 Atherosclerosis of native arteries of extremities with rest pain, left leg: Secondary | ICD-10-CM | POA: Diagnosis not present

## 2024-06-29 ENCOUNTER — Encounter: Payer: Self-pay | Admitting: Family Medicine

## 2024-07-03 DIAGNOSIS — M800AXD Age-related osteoporosis with current pathological fracture, other site, subsequent encounter for fracture with routine healing: Secondary | ICD-10-CM | POA: Diagnosis not present

## 2024-07-08 DIAGNOSIS — I700 Atherosclerosis of aorta: Secondary | ICD-10-CM | POA: Diagnosis not present

## 2024-07-10 ENCOUNTER — Encounter: Payer: Self-pay | Admitting: Internal Medicine

## 2024-07-19 DIAGNOSIS — J479 Bronchiectasis, uncomplicated: Secondary | ICD-10-CM | POA: Diagnosis not present

## 2024-07-21 ENCOUNTER — Encounter: Payer: Self-pay | Admitting: Internal Medicine

## 2024-07-21 ENCOUNTER — Ambulatory Visit: Admitting: Internal Medicine

## 2024-07-21 VITALS — BP 120/80 | HR 77 | Temp 97.6°F | Ht 59.0 in | Wt 92.0 lb

## 2024-07-21 DIAGNOSIS — J449 Chronic obstructive pulmonary disease, unspecified: Secondary | ICD-10-CM

## 2024-07-21 DIAGNOSIS — J9611 Chronic respiratory failure with hypoxia: Secondary | ICD-10-CM | POA: Diagnosis not present

## 2024-07-21 MED ORDER — ROFLUMILAST 500 MCG PO TABS: 500.0000 ug | ORAL_TABLET | Freq: Every day | ORAL | 3 refills | Status: AC

## 2024-07-21 NOTE — Progress Notes (Signed)
 [facility] [place] Pulmonary Medicine Consultation     Date: 07/21/2024,   MRN# [id number] AMERIA SANJURJO January 29, 1934      Synopsis: Braden Fireman was first seen by the [facility] pulmonary clinic in the summer of 2013. She has COPD. Simple spirometry performed in clinic showed an FEV1 of 45% predicted with clear obstruction.  She smoked 2 pack/day x 40 years, quit  17 years ago according to patient. She is a frequent exacerbation phenotype. She uses 2 L of oxygen  continuously with exertion and at night   lling, mild erythema CC  Follow-up assessment for severe respiratory insufficiency Follow-up chronic hypoxic respiratory failure Follow-up chronic bronchitis COPD chronic bronchiectasis  HPI Previous admission for acute limb ischemia with small bowel obstruction  No exacerbation at this time No evidence of heart failure at this time No evidence or signs of infection at this time No respiratory distress No fevers, chills, nausea, vomiting, diarrhea No evidence of lower extremity edema No evidence hemoptysis  Chronically ill-appearing Chronic shortness of breath and dyspnea exertion Severe respiratory sufficiency Continues to take Breztri  inhaler which seems to help Patient unable to tolerate phosphodiesterase nebulized therapy We discussed adding Daliresp  to her regimen Side effects reviewed with patient     Chronic Hypoxic resp failure due to COPD -Patient benefits from oxygen  therapy 2L [place]  -recommend using oxygen  as prescribed -patient needs this for survival Oxygen  saturations goal therapy is 88 to 95% Patient was in the room talking to me without oxygen  and her O2 sat was 92% Patient will need oxygen  therapy with exertion and at night      Diagnosed with COVID July 2022 Declining ever since then S/p VASC procedure    Severe respiratory insufficiency Debilitating shortness of breath Chronic shortness of breath and dyspnea exertion Stable over the last  several years Continue chest therapy   Smart vest therapy regimen Her symptoms improved significantly and she is very happy Less short of breath Significant mucus production has decreased with usage No evidence of infection Smart vest regimen includes the following Patient does 20 minutes twice daily Intensity at 20,25 and 35% respectively Patient tolerating very well  Patient diagnosed with bladder cancer sees urology   CT chest confirms bilateral bronchiectasis October 14, 2020 Assessment of chronic bronchitis and chronic bronchiectasis Patient has excessive mucus production despite flutter valve She does flutter valve 10 x 10 times a day, despite flutter valve patient has extensive mucus production Patient has chronic productive cough more than 6 months     o signs of CHF or lower ext swelling at t ssues, has chCurrent Medication:   Current Outpatient Medications:    Acetylcysteine (NAC 600 PO), Take by mouth., Disp: , Rfl:    albuterol  (PROVENTIL ) (2.5 MG/3ML) 0.083% nebulizer solution, INHALE THE CONTENTS OF 1 VIAL VIA NEBULIZER EVERY 4 HOURS AS NEEDED FOR WHEEZING OR SHORTNESS OF BREATH., Disp: 450 mL, Rfl: 10   albuterol  (VENTOLIN  HFA) 108 (90 Base) MCG/ACT inhaler, Inhale 2 puffs into the lungs every 6 (six) hours as needed for wheezing or shortness of breath., Disp: 8 g, Rfl: 6   ascorbic acid (VITAMIN C) 1000 MG tablet, Take 1,000 mg by mouth daily. , Disp: , Rfl:    atorvastatin  (LIPITOR) 10 MG tablet, TAKE 1 TABLET EVERY DAY, Disp: 90 tablet, Rfl: 3   Azelastine  HCl 137 MCG/SPRAY SOLN, Place 2 sprays into both nostrils 2 (two) times daily., Disp: 100 mL, Rfl: 5   budesonide -glycopyrrolate -formoterol  (BREZTRI  AEROSPHERE) 160-9-4.8 MCG/ACT AERO inhaler,  Inhale 2 puffs into the lungs in the morning and at bedtime., Disp: , Rfl:    budesonide -glycopyrrolate -formoterol  (BREZTRI  AEROSPHERE) 160-9-4.8 MCG/ACT AERO inhaler, Inhale 2 puffs into the lungs 2 (two) times daily.,  Disp: 3 each, Rfl: 3   Cholecalciferol  (VITAMIN D ) 125 MCG (5000 UT) CAPS, Take 5,000 Units by mouth daily., Disp: , Rfl:    clopidogrel  (PLAVIX ) 75 MG tablet, TAKE 1 TABLET EVERY DAY, Disp: 90 tablet, Rfl: 3   Coenzyme Q10 (COQ10) 200 MG CAPS, Take by mouth., Disp: , Rfl:    collagenase  (SANTYL ) 250 UNIT/GM ointment, Apply 1 Application topically daily. (Patient not taking: Reported on 05/06/2024), Disp: 15 g, Rfl: 0   guaiFENesin  (MUCINEX ) 600 MG 12 hr tablet, Take 600 mg by mouth 2 (two) times daily., Disp: , Rfl:    losartan  (COZAAR ) 25 MG tablet, Take 25 mg by mouth daily., Disp: , Rfl:    melatonin 5 MG TABS, Take 1 tablet (5 mg total) by mouth at bedtime., Disp: 30 tablet, Rfl: 0   Multiple Vitamins-Minerals (PRESERVISION AREDS) TABS, Take 1 tablet by mouth 2 (two) times daily., Disp: , Rfl:    OXYGEN , Inhale 4 L into the lungs daily in the afternoon. (Patient taking differently: Inhale 4 L into the lungs daily in the afternoon. States worsening SOB and HHC nurse told them to increase from 2 to 3L  Currently O2 2-3L), Disp: , Rfl:    pantoprazole  (PROTONIX ) 40 MG tablet, Take 1 tablet (40 mg total) by mouth daily. (Patient not taking: Reported on 05/06/2024), Disp: 14 tablet, Rfl: 0   Pari LC Sprint Nebulizer Set MISC, , Disp: , Rfl:    polyethylene glycol (MIRALAX  / GLYCOLAX ) 17 g packet, Take 17 g by mouth daily., Disp: 14 each, Rfl: 0   predniSONE  (DELTASONE ) 10 MG tablet, Take 4 tablets (40 mg total) by mouth daily with breakfast. 10 days (Patient not taking: Reported on 06/10/2024), Disp: 10 tablet, Rfl: 1   predniSONE  (DELTASONE ) 20 MG tablet, Take 1 tablet (20 mg total) by mouth daily with breakfast. 10 days, Disp: 10 tablet, Rfl: 1   senna-docusate (SENOKOT-S) 8.6-50 MG tablet, Take 1 tablet by mouth 2 (two) times daily as needed for mild constipation., Disp: 60 tablet, Rfl: 0   traMADol  (ULTRAM ) 50 MG tablet, Take 50 mg by mouth every 6 (six) hours as needed for moderate pain (pain  score 4-6) or severe pain (pain score 7-10). (Patient not taking: Reported on 05/06/2024), Disp: , Rfl:    traZODone  (DESYREL ) 50 MG tablet, Take 0.5-1 tablets (25-50 mg total) by mouth at bedtime as needed for sleep., Disp: 30 tablet, Rfl: 3   verapamil  (VERELAN  PM) 360 MG 24 hr capsule, Take 360 mg by mouth daily., Disp: , Rfl:    Vitamin E 180 MG CAPS, Take 180 mg by mouth daily., Disp: , Rfl:    Wound Dressings (HYDROFERA BLUE 4X4) PADS, Apply to knee ulcer. (Patient not taking: Reported on 05/06/2024), Disp: 5 each, Rfl: 0  Immunization History  Administered Date(s) Administered   Fluad Quad(high Dose 65+) 04/09/2019, 04/27/2020, 04/28/2021, 05/08/2022   Fluad Trivalent(High Dose 65+) 05/01/2023   H1N1 10/04/2008   Influenza Split 04/21/2011, 05/12/2012   Influenza Whole 05/27/2007, 05/08/2008, 05/04/2009, 04/26/2010   Influenza, High Dose Seasonal PF 05/01/2023   Influenza,inj,Quad PF,6+ Mos 05/06/2013, 05/06/2014, 04/21/2015, 05/11/2016, 04/17/2017, 05/01/2018   Pneumococcal Conjugate-13 09/21/2014   Pneumococcal Polysaccharide-23 03/13/2006, 06/28/2011   Td 03/13/2006   Tdap 10/04/2016   Zoster Recombinant(Shingrix) 11/08/2020, 01/19/2021  Zoster, Live 09/05/2010     ALLERGIES   Patient has no known allergies.  BP 120/80   Pulse 77   Temp 97.6 F (36.4 C)   Ht 4' 11 (1.499 m)   Wt 92 lb (41.7 kg)   SpO2 94%   BMI 18.58 kg/m     Physical Examination:  General Appearance: Minimal distress EYES EOM intact.   NECK Supple, No JVD Pulmonary: normal breath sounds, No wheezing.  CardiovascularNormal S1,S2.  No m/r/g.   Ext pulses intact, cap refill intact  ALL OTHER ROS ARE NEGATIVE        ASSESSMENT/PLAN   89 year old pleasant white female seen today for follow-up assessment for COPD Gold stage D end-stage severe COPD with chronic hypoxic respiratory failure CT findings suggestion of severe emphysema and bronchiectasis with right lower lobe pleural-based  nodule with excessive daytime sleepiness findings may sugge get are you st underlying sleep apnea but does not want to get any further sleep apnea testing nor does she want any further imaging of her lung nodule with CT scans Patient with chronic mucus production with chronic bronchiectasis chronic bronchitis   Assessment & Plan Chronic obstructive pulmonary disease, unspecified COPD type (HCC) COPD Gold stage D end-stage severe COPD Continue Breztri  inhaler 2 puffs twice daily Rinse mouth after use Start Daliresp  500 mg     Chronic Hypoxic resp failure due to COPD -Patient benefits from oxygen  therapy 2L [place]  -recommend using oxygen  as prescribed -patient needs this for survival     Bronchiectasis without complication (HCC) Chronic bronchitis chronic bronchiectasis patient is clearly benefiting from chest physiotherapy Mucus production has diminished Patient using oxygen  only with exertion Patient does 20 minutes twice daily Intensity at 20,25 and 35% respectively Patient tolerating very well  Respiratory insufficiency Severe respiratory insufficiency Continue to use oxygen  as prescribed Continue supportive care Avoid infections Keep up-to-date on immunizations    Abnormal CT of the chest CT chest ABNORMAL Right pleural-based nodule based on CT scan findings Patient does not want repeat CT chest to assess interval changes She does not want to pursue any further diagnostic testing She is happy with her life and does not want any type of work-up for this nodule We addressed repeating CT scans at this time however patient does not want any  further intervention or work-up   Acute lower limb ischemia Acute on chronic critical limb ischemia left lower extremity Continue ASA 81 mg and Plavix  75 mg p.o. daily S/p Heparin  IV infusion, on 8/4 started Aggrastat  infusion after angiogram as per vascular surgery till 8/5 Vascular surgery consulted s/p angioplasty and stent  inserted in left SFA, and left profunda femoris artery done on 8/4 8/5 continue DAPT  H/o Pleural effusion 8/6 s/p left thoracentesis 1.1 L fluid was tapped by IR Fluid studies consistent with partially exudative by LDH criteria.  No evidence of infection. CXR postprocedure did not show any complications   MEDICATION ADJUSTMENTS/LABS AND TESTS ORDERED: Continue smart vest therapy Mucinex  as needed Nebulizers and flutter valve as prescribed Breztri  inhaler therapy 2 puffs twice daily Rinse mouth after use Continue oxygen  88 to 95% 2 L titrate as needed Recommend Keep up-to-date with vaccinations Follow-up vascular surgery assessment   CURRENT MEDICATIONS REVIEWED AT LENGTH WITH PATIENT TODAY   Patient  satisfied with Plan of action and management. All questions answered   Follow up 3 months   I spent a total of 41 minutes dedicated to the care of this patient on the date  of this encounter to include pre-visit review of records, face-to-face time with the patient discussing conditions above, post visit ordering of testing, clinical documentation with the electronic health record, making appropriate referrals as documented, and communicating necessary information to the patient's healthcare team.    The Patient requires high complexity decision making for assessment and support, frequent evaluation and titration of therapies, application of advanced monitoring technologies and extensive interpretation of multiple databases.  Patient satisfied with Plan of action and management. All questions answered    Nickolas Alm Cellar, M.D.  [facility]  Medical Director [facility]

## 2024-07-21 NOTE — Patient Instructions (Signed)
 Continue smart vest therapy as prescribed Mucinex  as needed Nebulizers and flutter valve as prescribed Breztri  inhaler therapy 2 puffs twice daily Rinse mouth after use Continue oxygen  88 to 95% 2 L titrate as needed Avoid Allergens and Irritants Avoid secondhand smoke Avoid SICK contacts Recommend  Masking  when appropriate Recommend Keep up-to-date with vaccinations  Continue oxygen  as prescribed Start Daliresp  500 mg daily-side effects or GI symptoms

## 2024-07-22 ENCOUNTER — Other Ambulatory Visit: Payer: Self-pay | Admitting: Family Medicine

## 2024-07-22 MED ORDER — TRAZODONE HCL 50 MG PO TABS: 25.0000 mg | ORAL_TABLET | Freq: Every evening | ORAL | 0 refills | Status: DC | PRN

## 2024-07-26 ENCOUNTER — Other Ambulatory Visit: Payer: Self-pay | Admitting: Family Medicine

## 2024-08-07 ENCOUNTER — Other Ambulatory Visit: Payer: Self-pay

## 2024-08-07 ENCOUNTER — Observation Stay
Admission: EM | Admit: 2024-08-07 | Discharge: 2024-08-08 | Disposition: A | Discharge status: Home / Self Care | Attending: Internal Medicine | Admitting: Internal Medicine

## 2024-08-07 ENCOUNTER — Emergency Department

## 2024-08-07 DIAGNOSIS — J9601 Acute respiratory failure with hypoxia: Secondary | ICD-10-CM | POA: Diagnosis present

## 2024-08-07 DIAGNOSIS — R600 Localized edema: Secondary | ICD-10-CM | POA: Insufficient documentation

## 2024-08-07 DIAGNOSIS — J9621 Acute and chronic respiratory failure with hypoxia: Secondary | ICD-10-CM | POA: Diagnosis not present

## 2024-08-07 DIAGNOSIS — Z87891 Personal history of nicotine dependence: Secondary | ICD-10-CM | POA: Insufficient documentation

## 2024-08-07 DIAGNOSIS — J479 Bronchiectasis, uncomplicated: Secondary | ICD-10-CM | POA: Insufficient documentation

## 2024-08-07 DIAGNOSIS — J449 Chronic obstructive pulmonary disease, unspecified: Secondary | ICD-10-CM

## 2024-08-07 DIAGNOSIS — Z681 Body mass index (BMI) 19 or less, adult: Secondary | ICD-10-CM | POA: Insufficient documentation

## 2024-08-07 DIAGNOSIS — R000 Tachycardia, unspecified: Secondary | ICD-10-CM | POA: Insufficient documentation

## 2024-08-07 DIAGNOSIS — Z8551 Personal history of malignant neoplasm of bladder: Secondary | ICD-10-CM | POA: Insufficient documentation

## 2024-08-07 DIAGNOSIS — E43 Unspecified severe protein-calorie malnutrition: Secondary | ICD-10-CM | POA: Insufficient documentation

## 2024-08-07 DIAGNOSIS — Z7902 Long term (current) use of antithrombotics/antiplatelets: Secondary | ICD-10-CM | POA: Diagnosis not present

## 2024-08-07 DIAGNOSIS — J441 Chronic obstructive pulmonary disease with (acute) exacerbation: Secondary | ICD-10-CM | POA: Diagnosis not present

## 2024-08-07 DIAGNOSIS — Z8679 Personal history of other diseases of the circulatory system: Secondary | ICD-10-CM | POA: Diagnosis not present

## 2024-08-07 DIAGNOSIS — J411 Mucopurulent chronic bronchitis: Secondary | ICD-10-CM

## 2024-08-07 DIAGNOSIS — I739 Peripheral vascular disease, unspecified: Secondary | ICD-10-CM | POA: Diagnosis not present

## 2024-08-07 DIAGNOSIS — I10 Essential (primary) hypertension: Secondary | ICD-10-CM | POA: Diagnosis not present

## 2024-08-07 DIAGNOSIS — Z85828 Personal history of other malignant neoplasm of skin: Secondary | ICD-10-CM | POA: Insufficient documentation

## 2024-08-07 DIAGNOSIS — Z79899 Other long term (current) drug therapy: Secondary | ICD-10-CM | POA: Diagnosis not present

## 2024-08-07 DIAGNOSIS — J90 Pleural effusion, not elsewhere classified: Secondary | ICD-10-CM | POA: Insufficient documentation

## 2024-08-07 DIAGNOSIS — R0602 Shortness of breath: Secondary | ICD-10-CM | POA: Diagnosis present

## 2024-08-07 DIAGNOSIS — J9611 Chronic respiratory failure with hypoxia: Secondary | ICD-10-CM | POA: Diagnosis present

## 2024-08-07 LAB — COMPREHENSIVE METABOLIC PANEL WITH GFR
ALT: 22 U/L (ref 0–44)
AST: 22 U/L (ref 15–41)
Albumin: 4.3 g/dL (ref 3.5–5.0)
Alkaline Phosphatase: 108 U/L (ref 38–126)
Anion gap: 15 (ref 5–15)
BUN: 10 mg/dL (ref 8–23)
CO2: 26 mmol/L (ref 22–32)
Calcium: 9.5 mg/dL (ref 8.9–10.3)
Chloride: 102 mmol/L (ref 98–111)
Creatinine, Ser: 0.62 mg/dL (ref 0.44–1.00)
GFR, Estimated: >60 mL/min
Glucose, Bld: 176 mg/dL — ABNORMAL HIGH (ref 70–99)
Potassium: 3.7 mmol/L (ref 3.5–5.1)
Sodium: 142 mmol/L (ref 135–145)
Total Bilirubin: 0.3 mg/dL (ref 0.0–1.2)
Total Protein: 6.7 g/dL (ref 6.5–8.1)

## 2024-08-07 LAB — CBC WITH DIFFERENTIAL/PLATELET
Abs Immature Granulocytes: 0.04 K/uL (ref 0.00–0.07)
Basophils Absolute: 0 K/uL (ref 0.0–0.1)
Basophils Relative: 0 %
Eosinophils Absolute: 0.2 K/uL (ref 0.0–0.5)
Eosinophils Relative: 2 %
HCT: 35.1 % — ABNORMAL LOW (ref 36.0–46.0)
Hemoglobin: 11.3 g/dL — ABNORMAL LOW (ref 12.0–15.0)
Immature Granulocytes: 0 %
Lymphocytes Relative: 16 %
Lymphs Abs: 1.7 K/uL (ref 0.7–4.0)
MCH: 30.1 pg (ref 26.0–34.0)
MCHC: 32.2 g/dL (ref 30.0–36.0)
MCV: 93.4 fL (ref 80.0–100.0)
Monocytes Absolute: 0.9 K/uL (ref 0.1–1.0)
Monocytes Relative: 8 %
Neutro Abs: 7.6 K/uL (ref 1.7–7.7)
Neutrophils Relative %: 74 %
Platelets: 383 K/uL (ref 150–400)
RBC: 3.76 MIL/uL — ABNORMAL LOW (ref 3.87–5.11)
RDW: 13.2 % (ref 11.5–15.5)
WBC: 10.4 K/uL (ref 4.0–10.5)
nRBC: 0 % (ref 0.0–0.2)

## 2024-08-07 LAB — APTT: aPTT: 32 s (ref 24–36)

## 2024-08-07 LAB — RESP PANEL BY RT-PCR (RSV, FLU A&B, COVID)  RVPGX2
Influenza A by PCR: NEGATIVE
Influenza B by PCR: NEGATIVE
Resp Syncytial Virus by PCR: NEGATIVE
SARS Coronavirus 2 by RT PCR: NEGATIVE

## 2024-08-07 LAB — BLOOD GAS, VENOUS
Acid-Base Excess: 2.2 mmol/L — ABNORMAL HIGH (ref 0.0–2.0)
Bicarbonate: 28.3 mmol/L — ABNORMAL HIGH (ref 20.0–28.0)
O2 Saturation: 88.4 %
Patient temperature: 37
pCO2, Ven: 49 mmHg (ref 44–60)
pH, Ven: 7.37 (ref 7.25–7.43)
pO2, Ven: 60 mmHg — ABNORMAL HIGH (ref 32–45)

## 2024-08-07 LAB — RESPIRATORY PANEL BY PCR

## 2024-08-07 LAB — TROPONIN T, HIGH SENSITIVITY
Troponin T High Sensitivity: 19 ng/L (ref 0–19)
Troponin T High Sensitivity: 21 ng/L — ABNORMAL HIGH (ref 0–19)

## 2024-08-07 LAB — PRO BRAIN NATRIURETIC PEPTIDE: Pro Brain Natriuretic Peptide: 201 pg/mL

## 2024-08-07 LAB — PROTIME-INR
INR: 0.9 (ref 0.8–1.2)
Prothrombin Time: 12.3 s (ref 11.4–15.2)

## 2024-08-07 MED ORDER — METHYLPREDNISOLONE SODIUM SUCC 40 MG IJ SOLR
40.0000 mg | Freq: Two times a day (BID) | INTRAMUSCULAR | Status: AC
  Administered 2024-08-07 – 2024-08-08 (×2): 40 mg via INTRAVENOUS
  Filled 2024-08-07 (×2): qty 1

## 2024-08-07 MED ORDER — PREDNISONE 20 MG PO TABS: 40.0000 mg | ORAL_TABLET | Freq: Every day | ORAL | Status: DC

## 2024-08-07 MED ORDER — VERAPAMIL HCL ER 180 MG PO TBCR
360.0000 mg | EXTENDED_RELEASE_TABLET | Freq: Every day | ORAL | Status: DC
  Administered 2024-08-08: 360 mg via ORAL
  Filled 2024-08-07: qty 2

## 2024-08-07 MED ORDER — BUDESON-GLYCOPYRROL-FORMOTEROL 160-9-4.8 MCG/ACT IN AERO
2.0000 | INHALATION_SPRAY | Freq: Two times a day (BID) | RESPIRATORY_TRACT | Status: DC
  Administered 2024-08-08: 2 via RESPIRATORY_TRACT
  Filled 2024-08-07: qty 5.9

## 2024-08-07 MED ORDER — SENNOSIDES-DOCUSATE SODIUM 8.6-50 MG PO TABS: 1.0000 | ORAL_TABLET | Freq: Two times a day (BID) | ORAL | Status: DC | PRN

## 2024-08-07 MED ORDER — ONDANSETRON HCL 4 MG/2ML IJ SOLN
4.0000 mg | Freq: Four times a day (QID) | INTRAMUSCULAR | Status: DC | PRN
  Filled 2024-08-07: qty 2

## 2024-08-07 MED ORDER — LOSARTAN POTASSIUM 50 MG PO TABS
25.0000 mg | ORAL_TABLET | Freq: Every day | ORAL | Status: DC
  Administered 2024-08-08: 25 mg via ORAL
  Filled 2024-08-07: qty 1

## 2024-08-07 MED ORDER — AZELASTINE HCL 137 MCG/SPRAY NA SOLN: 2.0000 | Freq: Two times a day (BID) | NASAL | Status: DC

## 2024-08-07 MED ORDER — MELATONIN 5 MG PO TABS
5.0000 mg | ORAL_TABLET | Freq: Every day | ORAL | Status: DC
  Administered 2024-08-07: 5 mg via ORAL
  Filled 2024-08-07: qty 1

## 2024-08-07 MED ORDER — CLOPIDOGREL BISULFATE 75 MG PO TABS
75.0000 mg | ORAL_TABLET | Freq: Every day | ORAL | Status: DC
  Administered 2024-08-08: 75 mg via ORAL
  Filled 2024-08-07: qty 1

## 2024-08-07 MED ORDER — DOXYCYCLINE HYCLATE 100 MG PO TABS
100.0000 mg | ORAL_TABLET | Freq: Two times a day (BID) | ORAL | Status: DC
  Administered 2024-08-07 – 2024-08-08 (×3): 100 mg via ORAL
  Filled 2024-08-07 (×3): qty 1

## 2024-08-07 MED ORDER — FUROSEMIDE 10 MG/ML IJ SOLN
20.0000 mg | Freq: Once | INTRAMUSCULAR | Status: AC
  Administered 2024-08-07: 20 mg via INTRAVENOUS
  Filled 2024-08-07: qty 4

## 2024-08-07 MED ORDER — IPRATROPIUM-ALBUTEROL 0.5-2.5 (3) MG/3ML IN SOLN
3.0000 mL | Freq: Two times a day (BID) | RESPIRATORY_TRACT | Status: DC
  Administered 2024-08-08: 3 mL via RESPIRATORY_TRACT
  Filled 2024-08-07: qty 3

## 2024-08-07 MED ORDER — ROFLUMILAST 500 MCG PO TABS
500.0000 ug | ORAL_TABLET | Freq: Every day | ORAL | Status: DC
  Administered 2024-08-08: 500 ug via ORAL
  Filled 2024-08-07: qty 1

## 2024-08-07 MED ORDER — HYDRALAZINE HCL 20 MG/ML IJ SOLN: 5.0000 mg | Freq: Four times a day (QID) | INTRAMUSCULAR | Status: DC | PRN

## 2024-08-07 MED ORDER — ACETAMINOPHEN 650 MG RE SUPP: 650.0000 mg | Freq: Four times a day (QID) | RECTAL | Status: DC | PRN

## 2024-08-07 MED ORDER — TRAZODONE HCL 50 MG PO TABS
50.0000 mg | ORAL_TABLET | Freq: Every evening | ORAL | Status: DC | PRN
  Administered 2024-08-07: 50 mg via ORAL
  Filled 2024-08-07: qty 1

## 2024-08-07 MED ORDER — ENOXAPARIN SODIUM 30 MG/0.3ML IJ SOSY
30.0000 mg | PREFILLED_SYRINGE | INTRAMUSCULAR | Status: DC
  Administered 2024-08-07: 30 mg via SUBCUTANEOUS
  Filled 2024-08-07: qty 0.3

## 2024-08-07 MED ORDER — ONDANSETRON HCL 4 MG PO TABS: 4.0000 mg | ORAL_TABLET | Freq: Four times a day (QID) | ORAL | Status: DC | PRN

## 2024-08-07 MED ORDER — ENOXAPARIN SODIUM 40 MG/0.4ML IJ SOSY: 40.0000 mg | PREFILLED_SYRINGE | INTRAMUSCULAR | Status: DC

## 2024-08-07 MED ORDER — GUAIFENESIN ER 600 MG PO TB12
600.0000 mg | ORAL_TABLET | Freq: Two times a day (BID) | ORAL | Status: DC
  Administered 2024-08-07 – 2024-08-08 (×2): 600 mg via ORAL
  Filled 2024-08-07 (×2): qty 1

## 2024-08-07 MED ORDER — POLYETHYLENE GLYCOL 3350 17 G PO PACK
17.0000 g | PACK | Freq: Every day | ORAL | Status: DC
  Filled 2024-08-07: qty 1

## 2024-08-07 MED ORDER — IPRATROPIUM-ALBUTEROL 0.5-2.5 (3) MG/3ML IN SOLN
3.0000 mL | Freq: Once | RESPIRATORY_TRACT | Status: AC
  Administered 2024-08-07: 3 mL via RESPIRATORY_TRACT
  Filled 2024-08-07: qty 3

## 2024-08-07 MED ORDER — IPRATROPIUM-ALBUTEROL 0.5-2.5 (3) MG/3ML IN SOLN
3.0000 mL | Freq: Four times a day (QID) | RESPIRATORY_TRACT | Status: DC
  Administered 2024-08-07: 3 mL via RESPIRATORY_TRACT
  Filled 2024-08-07: qty 3

## 2024-08-07 MED ORDER — ACETAMINOPHEN 325 MG PO TABS
650.0000 mg | ORAL_TABLET | Freq: Four times a day (QID) | ORAL | Status: DC | PRN
  Administered 2024-08-07: 650 mg via ORAL
  Filled 2024-08-07: qty 2

## 2024-08-07 MED ORDER — ALBUTEROL SULFATE (2.5 MG/3ML) 0.083% IN NEBU
2.5000 mg | INHALATION_SOLUTION | RESPIRATORY_TRACT | Status: DC | PRN
  Administered 2024-08-08: 2.5 mg via RESPIRATORY_TRACT
  Filled 2024-08-07: qty 3

## 2024-08-07 NOTE — Progress Notes (Signed)
 PHARMACIST - PHYSICIAN COMMUNICATION  CONCERNING:  Enoxaparin  (Lovenox ) for DVT Prophylaxis    RECOMMENDATION: Patient was prescribed enoxaprin 40mg  q24 hours for VTE prophylaxis.   Filed Weights   08/07/24 1318  Weight: 41.7 kg (92 lb)    Body mass index is 17.38 kg/m.  Estimated Creatinine Clearance: 32 mL/min (by C-G formula based on SCr of 0.62 mg/dL).  Patient is candidate for enoxaparin  30mg  every 24 hours based on CrCl <49ml/min or Weight <45kg  DESCRIPTION: Pharmacy has adjusted enoxaparin  dose per [facility] policy.  Patient is now receiving enoxaparin  30 mg every 24 hours    Olam KANDICE Fritter, PharmD Clinical Pharmacist  08/07/2024 3:21 PM

## 2024-08-07 NOTE — H&P (Signed)
 " History and Physical    Misty Ferguson FMW:[id number] DOB: [birth year] DOA: 08/07/2024  PCP: Avelina Greig BRAVO, MD (Confirm with patient/family/NH records and if not entered, this has to be entered at [facility] point of entry) Patient coming from: Home  I have personally briefly reviewed patient's old medical records in [facility]  Chief Complaint: Cough, wheezing, shortness of breath  HPI: Misty Ferguson is a 89 y.o. female with medical history significant of advanced COPD, with chronic hypoxic respiratory failure usually on 2 to 3 L continuously, HTN, sinus tachycardia on verapamil , bladder cancer, PVD status post multiple interventions, presented with worsening of cough wheezing shortness of breath.  Patient in severe shortness of breath and provided only patches of information, most history provided by husband at bedside.  Symptoms started 3 to 4 weeks ago when patient started to have increasing cough, with intermittent whitish phlegm production, increasing wheezing and exertional dyspnea.  No chest pain or shortness of breath.  Patient was treated with 2 rounds of p.o. prednisone  in the last 3 weeks with no significant improvement.  Pulmonary also started patient on Roflumilast  last week.  Despite all the above management, patient did not feel significant improvement and decided to call EMS.  EMS gave patient IV Solu-Medrol  and DuoNebs x 2.  ED Course: Afebrile, tachycardia blood pressure 180/60 O2 saturation 90% on 5 L.  Chest x-ray showed no significant infiltrates, new onset of left-sided pleural effusion.  Patient was started on BiPAP however did not tolerated well.  VBG 7.30 7/49/60, WBC 10.4 hemoglobin 11.3 BUN 10 creatinine 0.6 glucose 176.  Review of Systems: As per HPI otherwise 14 point review of systems negative.    Past Medical History:  Diagnosis Date   AK (actinic keratosis) 12/08/2020   right pretibia inferior bx proven, LN2 01/10/21   Burping    Chronic airway obstruction,  not elsewhere classified    Dyspnea    Dysrhythmia    Macular degeneration (senile) of retina, unspecified    Malignant neoplasm of urinary bladder (HCC) 03/2019   Partial bladder resection and Rad tx's.    Obstructive chronic bronchitis with exacerbation (HCC)    Osteoporosis, unspecified    Oxygen  deficiency    Personal history of peptic ulcer disease    SCC (squamous cell carcinoma) 12/08/2020   right pretibia superior, EDC 01/10/21   SCC (squamous cell carcinoma) 07/03/2022   left medial calf, clear 09/25/22   SCC (squamous cell carcinoma) 11/22/2022   right thigh   treated with ED&C   Squamous cell carcinoma in situ (SCCIS) 07/03/2022   right pretibia sccis needs treatment with fluorouracil    Tobacco use disorder    Unspecified essential hypertension    Unspecified glaucoma(365.9)     Past Surgical History:  Procedure Laterality Date   CATARACT EXTRACTION W/ INTRAOCULAR LENS  IMPLANT, BILATERAL     CYSTOSCOPY WITH STENT PLACEMENT Left 05/22/2019   Procedure: CYSTOSCOPY WITH STENT PLACEMENT;  Surgeon: Francisca Redell BROCKS, MD;  Location: [facility] ORS;  Service: Urology;  Laterality: Left;   ELBOW FRACTURE SURGERY Left    LOWER EXTREMITY ANGIOGRAPHY Right 11/20/2021   Procedure: Lower Extremity Angiography;  Surgeon: Marea Selinda RAMAN, MD;  Location: [facility] INVASIVE CV LAB;  Service: Cardiovascular;  Laterality: Right;   LOWER EXTREMITY ANGIOGRAPHY Left 09/03/2022   Procedure: Lower Extremity Angiography;  Surgeon: Marea Selinda RAMAN, MD;  Location: [facility] INVASIVE CV LAB;  Service: Cardiovascular;  Laterality: Left;   LOWER EXTREMITY ANGIOGRAPHY Left 06/03/2023  Procedure: Lower Extremity Angiography;  Surgeon: Marea Selinda RAMAN, MD;  Location: [facility] INVASIVE CV LAB;  Service: Cardiovascular;  Laterality: Left;   LOWER EXTREMITY ANGIOGRAPHY Left 03/16/2024   Procedure: Lower Extremity Angiography;  Surgeon: Marea Selinda RAMAN, MD;  Location: [facility] INVASIVE CV LAB;  Service: Cardiovascular;  Laterality: Left;    TRANSURETHRAL RESECTION OF BLADDER TUMOR N/A 05/22/2019   Procedure: TRANSURETHRAL RESECTION OF BLADDER TUMOR (TURBT);  Surgeon: Francisca Redell BROCKS, MD;  Location: [facility] ORS;  Service: Urology;  Laterality: N/A;     reports that she quit smoking about 28 years ago. Her smoking use included cigarettes. She started smoking about 73 years ago. She has a 58.5 pack-year smoking history. She has been exposed to tobacco smoke. She has never used smokeless tobacco. She reports current alcohol use of about 14.0 standard drinks of alcohol per week. She reports that she does not use drugs.  Allergies[1]  Family History  Adopted: Yes    Prior to Admission medications  Medication Sig Start Date End Date Taking? Authorizing Provider  Acetylcysteine (NAC 600 PO) Take by mouth.    [provider]  albuterol  (PROVENTIL ) (2.5 MG/3ML) 0.083% nebulizer solution INHALE THE CONTENTS OF 1 VIAL VIA NEBULIZER EVERY 4 HOURS AS NEEDED FOR WHEEZING OR SHORTNESS OF BREATH. 02/07/24   Isaiah Scrivener, MD  albuterol  (VENTOLIN  HFA) 108 (90 Base) MCG/ACT inhaler Inhale 2 puffs into the lungs every 6 (six) hours as needed for wheezing or shortness of breath. 12/02/23   Isaiah Scrivener, MD  ascorbic acid (VITAMIN C) 1000 MG tablet Take 1,000 mg by mouth daily.     [provider]  atorvastatin  (LIPITOR) 10 MG tablet TAKE 1 TABLET EVERY DAY 02/19/24   Dew, Jason S, MD  Azelastine  HCl 137 MCG/SPRAY SOLN Place 2 sprays into both nostrils 2 (two) times daily. 01/20/24   Kasa, Kurian, MD  budesonide -glycopyrrolate -formoterol  (BREZTRI  AEROSPHERE) 160-9-4.8 MCG/ACT AERO inhaler Inhale 2 puffs into the lungs in the morning and at bedtime. 02/10/24   Kasa, Kurian, MD  budesonide -glycopyrrolate -formoterol  (BREZTRI  AEROSPHERE) 160-9-4.8 MCG/ACT AERO inhaler Inhale 2 puffs into the lungs 2 (two) times daily. 02/24/24   [name], Amy E, MD  Cholecalciferol  (VITAMIN D ) 125 MCG (5000 UT) CAPS Take 5,000 Units by mouth daily.    [provider]  clopidogrel  (PLAVIX ) 75 MG tablet TAKE 1 TABLET EVERY DAY 06/15/24   Dew, Jason S, MD  Coenzyme Q10 (COQ10) 200 MG CAPS Take by mouth.    [provider]  collagenase  (SANTYL ) 250 UNIT/GM ointment Apply 1 Application topically daily. Patient not taking: Reported on 05/06/2024 03/27/24   Avelina Greig BRAVO, MD  guaiFENesin  (MUCINEX ) 600 MG 12 hr tablet Take 600 mg by mouth 2 (two) times daily.    [provider]  losartan  (COZAAR ) 25 MG tablet Take 25 mg by mouth daily. 04/09/24   [provider]  melatonin 5 MG TABS Take 1 tablet (5 mg total) by mouth at bedtime. 03/22/24   Laurita Pillion, MD  Multiple Vitamins-Minerals (PRESERVISION AREDS) TABS Take 1 tablet by mouth 2 (two) times daily.    [provider]  OXYGEN  Inhale 4 L into the lungs daily in the afternoon. Patient taking differently: Inhale 4 L into the lungs daily in the afternoon. States worsening SOB and HHC nurse told them to increase from 2 to 3L  Currently O2 2-3L    [provider]  pantoprazole  (PROTONIX ) 40 MG tablet Take 1 tablet (40 mg total) by mouth daily. Patient  not taking: Reported on 05/06/2024 03/22/24   Laurita Pillion, MD  [facility] Sprint Nebulizer Set MISC  12/13/23   [provider]  polyethylene glycol (MIRALAX  / GLYCOLAX ) 17 g packet Take 17 g by mouth daily. 03/23/24   Laurita Pillion, MD  predniSONE  (DELTASONE ) 10 MG tablet Take 4 tablets (40 mg total) by mouth daily with breakfast. 10 days Patient not taking: Reported on 06/10/2024 04/07/24   Kasa, Kurian, MD  predniSONE  (DELTASONE ) 20 MG tablet Take 1 tablet (20 mg total) by mouth daily with breakfast. 10 days 06/05/24   Isaiah Scrivener, MD  roflumilast  (DALIRESP ) 500 MCG TABS tablet Take 1 tablet (500 mcg total) by mouth daily. 07/21/24   Kasa, Kurian, MD  senna-docusate (SENOKOT-S) 8.6-50 MG tablet Take 1 tablet by mouth 2 (two) times daily as needed for mild constipation. 03/22/24   Laurita Pillion, MD  traMADol   (ULTRAM ) 50 MG tablet Take 50 mg by mouth every 6 (six) hours as needed for moderate pain (pain score 4-6) or severe pain (pain score 7-10). Patient not taking: Reported on 05/06/2024    [provider]  traZODone  (DESYREL ) 50 MG tablet TAKE 0.5-1 TABLETS BY MOUTH AT BEDTIME AS NEEDED FOR SLEEP. 07/27/24   [name], Amy E, MD  verapamil  (VERELAN  PM) 360 MG 24 hr capsule Take 360 mg by mouth daily. 10/20/19   [provider]  Vitamin E 180 MG CAPS Take 180 mg by mouth daily.    [provider]  Wound Dressings (HYDROFERA BLUE 4X4) PADS Apply to knee ulcer. Patient not taking: Reported on 05/06/2024 03/27/24   Avelina Greig BRAVO, MD    Physical Exam: Vitals:   08/07/24 1312 08/07/24 1317 08/07/24 1318 08/07/24 1330  BP:  (!) 189/65  (!) 161/65  Pulse:  (!) 106  (!) 105  Resp:  (!) 28  (!) 23  Temp:  97.6 F (36.4 C)    TempSrc:  Oral    SpO2: 100% 90%  96%  Weight:   41.7 kg   Height:   5' 1 (1.549 m)     Constitutional: NAD, calm, comfortable Vitals:   08/07/24 1312 08/07/24 1317 08/07/24 1318 08/07/24 1330  BP:  (!) 189/65  (!) 161/65  Pulse:  (!) 106  (!) 105  Resp:  (!) 28  (!) 23  Temp:  97.6 F (36.4 C)    TempSrc:  Oral    SpO2: 100% 90%  96%  Weight:   41.7 kg   Height:   5' 1 (1.549 m)    Eyes: PERRL, lids and conjunctivae normal ENMT: Mucous membranes are moist. Posterior pharynx clear of any exudate or lesions.Normal dentition.  Neck: normal, supple, no masses, no thyromegaly Respiratory: Diminished breath sounds, bilaterally, scattered wheezing, no crackles.  Increasing respiratory effort.  Talking in broken sentences no accessory muscle use.  Cardiovascular: Regular rate and rhythm, no murmurs / rubs / gallops. 1+ extremity edema. 2+ pedal pulses. No carotid bruits.  Abdomen: no tenderness, no masses palpated. No hepatosplenomegaly. Bowel sounds positive.  Musculoskeletal: no clubbing / cyanosis. No joint deformity upper and lower extremities.  Good ROM, no contractures. Normal muscle tone.  Skin: no rashes, lesions, ulcers. No induration Neurologic: CN 2-12 grossly intact. Sensation intact, DTR normal. Strength 5/5 in all 4.  Psychiatric: Normal judgment and insight. Alert and oriented x 3. Normal mood.    Labs on Admission: I have personally reviewed following labs and imaging studies  CBC: Recent Labs  Lab 08/07/24 1328  WBC 10.4  NEUTROABS 7.6  HGB 11.3*  HCT 35.1*  MCV 93.4  PLT 383   Basic Metabolic Panel: Recent Labs  Lab 08/07/24 1328  NA 142  K 3.7  CL 102  CO2 26  GLUCOSE 176*  BUN 10  CREATININE 0.62  CALCIUM  9.5   GFR: Estimated Creatinine Clearance: 32 mL/min (by C-G formula based on SCr of 0.62 mg/dL). Liver Function Tests: Recent Labs  Lab 08/07/24 1328  AST 22  ALT 22  ALKPHOS 108  BILITOT 0.3  PROT 6.7  ALBUMIN 4.3   No results for input(s): LIPASE, AMYLASE in the last 168 hours. No results for input(s): AMMONIA in the last 168 hours. Coagulation Profile: Recent Labs  Lab 08/07/24 1328  INR 0.9   Cardiac Enzymes: No results for input(s): CKTOTAL, CKMB, CKMBINDEX, TROPONINI in the last 168 hours. BNP (last 3 results) Recent Labs    08/07/24 1328  PROBNP 201.0   HbA1C: No results for input(s): HGBA1C in the last 72 hours. CBG: No results for input(s): GLUCAP in the last 168 hours. Lipid Profile: No results for input(s): CHOL, HDL, LDLCALC, TRIG, CHOLHDL, LDLDIRECT in the last 72 hours. Thyroid  Function Tests: No results for input(s): TSH, T4TOTAL, FREET4, T3FREE, THYROIDAB in the last 72 hours. Anemia Panel: No results for input(s): VITAMINB12, FOLATE, FERRITIN, TIBC, IRON, RETICCTPCT in the last 72 hours. Urine analysis:    Component Value Date/Time   COLORURINE YELLOW 08/18/2010 1626   APPEARANCEUR Cloudy (A) 09/04/2023 1341   LABSPEC 1.021 08/18/2010 1626   PHURINE 5.0 08/18/2010 1626   GLUCOSEU Negative  09/04/2023 1341   HGBUR NEGATIVE 08/18/2010 1626   BILIRUBINUR Negative 09/04/2023 1341   KETONESUR NEGATIVE 08/18/2010 1626   PROTEINUR 3+ (A) 09/04/2023 1341   PROTEINUR NEGATIVE 08/18/2010 1626   UROBILINOGEN 0.2 04/24/2019 1037   UROBILINOGEN 0.2 08/18/2010 1626   NITRITE Negative 09/04/2023 1341   NITRITE POSITIVE (A) 08/18/2010 1626   LEUKOCYTESUR Negative 09/04/2023 1341    Radiological Exams on Admission: US  Venous Img Lower Bilateral Result Date: 08/07/2024 CLINICAL DATA:  89 year old female with bilateral lower extremity swelling. EXAM: BILATERAL LOWER EXTREMITY VENOUS DOPPLER ULTRASOUND TECHNIQUE: Gray-scale sonography with graded compression, as well as color Doppler and duplex ultrasound were performed to evaluate the lower extremity deep venous systems from the level of the common femoral vein and including the common femoral, femoral, profunda femoral, popliteal and calf veins including the posterior tibial, peroneal and gastrocnemius veins when visible. The superficial great saphenous vein was also interrogated. Spectral Doppler was utilized to evaluate flow at rest and with distal augmentation maneuvers in the common femoral, femoral and popliteal veins. COMPARISON:  None Available. FINDINGS: RIGHT LOWER EXTREMITY Common Femoral Vein: No evidence of thrombus. Normal compressibility, respiratory phasicity and response to augmentation. Saphenofemoral Junction: No evidence of thrombus. Normal compressibility and flow on color Doppler imaging. Profunda Femoral Vein: No evidence of thrombus. Normal compressibility and flow on color Doppler imaging. Femoral Vein: No evidence of thrombus. Normal compressibility, respiratory phasicity and response to augmentation. Popliteal Vein: No evidence of thrombus. Normal compressibility, respiratory phasicity and response to augmentation. Calf Veins: No evidence of thrombus. Normal compressibility and flow on color Doppler imaging. Other Findings:   None. LEFT LOWER EXTREMITY Common Femoral Vein: No evidence of thrombus. Normal compressibility, respiratory phasicity and response to augmentation. Saphenofemoral Junction: No evidence of thrombus. Normal compressibility and flow on color Doppler imaging. Profunda Femoral Vein: No evidence of thrombus. Normal compressibility and flow on color Doppler imaging.  Femoral Vein: No evidence of thrombus. Normal compressibility, respiratory phasicity and response to augmentation. Popliteal Vein: No evidence of thrombus. Normal compressibility, respiratory phasicity and response to augmentation. Calf Veins: No evidence of thrombus. Normal compressibility and flow on color Doppler imaging. Other Findings:  None. IMPRESSION: No evidence of bilateral lower extremity deep venous thrombosis. Ester Sides, MD [facility] Electronically Signed   By: Ester Sides M.D.   On: 08/07/2024 15:38   DG Chest Portable 1 View Result Date: 08/07/2024 EXAM: 1 VIEW(S) XRAY OF THE CHEST 08/07/2024 01:40:00 PM COMPARISON: 03/18/2024 and previous. CLINICAL HISTORY: sob FINDINGS: LUNGS AND PLEURA: New left pleural effusion with associated left basilar opacity. No pneumothorax. HEART AND MEDIASTINUM: Aortic atherosclerosis. BONES AND SOFT TISSUES: Remote left rib fractures. Chronic sclerotic focus in left humeral head, bone island. IMPRESSION: 1. New left pleural effusion with associated left basilar opacity. Electronically signed by: Katheleen Faes MD 08/07/2024 02:13 PM EST RP Workstation: [id number]    EKG: Independently reviewed.  Sinus tachycardia, chronic RBBB, no acute ST changes.  Assessment/Plan Active Problems:   COPD with acute exacerbation (HCC)   Acute on chronic respiratory failure with hypoxemia (HCC)   COPD exacerbation (HCC)  (please populate well all problems here in Problem List. (For example, if patient is on BP meds at home and you resume or decide to hold  them, it is a problem that needs to be her. Same for CAD, COPD, HLD and so on)  Acute on chronic hypoxic respiratory failure Acute on chronic COPD exacerbation - IV Solu-Medrol  - Doxycycline  twice daily - Continue BiPAP if tolerated.  Patient agreed to try BiPAP again. - Incentive spirometry and flutter valve -Continue Roflumilast  - Respiratory viral panel - Short-term and long-term prognosis poor.  Discussed with husband at bedside.  Confirmed that patient does not want to be intubated no matter what happened to her breathing status, and she does not wish to be resuscitated in the event of pulmonary cardiac arrest.  Question of new onset of CHF - Patient has signs of fluid overload on image study and physical exam showing new onset of ankle edema.  Etiology likely secondary to poorly controlled hypertension - 1 dose of 20 mg IV Lasix  x 1 - Echocardiogram  HTN History of paroxysm sinus tachycardia - Continue verapamil  - As needed hydralazine   History of PVD - Continue aspirin  Plavix  and statin  Severe protein calorie malnutrition - BMI= 17 - Remove all diet restrictions.  DVT prophylaxis: Lovenox  Code Status: DNR/DNI Family Communication: Husband at bedside Disposition Plan: Patient is stable with severe COPD exacerbation failed outpatient management requiring IV steroid and BiPAP support, expect more than 2 midnight hospital stay. Consults called: None Admission status: PCU admit   Cort ONEIDA Mana MD [facility] Pager [phone]  08/07/2024, 3:52 PM       [1] No Known Allergies  "

## 2024-08-07 NOTE — ED Provider Notes (Signed)
 "  [facility] Provider Note    Event Date/Time   First MD Initiated Contact with Patient 08/07/24 1313     (approximate)   History   Shortness of Breath   HPI  Misty Ferguson is a 89 y.o. female with history of COPD who comes in with concerns for shortness of breath.  Patient reports history of COPD.  She reports off-and-on issues with shortness of breath over the past few weeks, coughing.  When EMS got there she was on her baseline 3 L satting in the upper 80s.  She was given 2 DuoNebs and 125 of Solu-Medrol .  When patient came in she had labored breathing with increased accessory muscle use.  She does report taking Plavix  for history of stents.  She denies any falls hitting her head, abdominal pain or any other concerns.  Physical Exam   Triage Vital Signs: ED Triage Vitals  Encounter Vitals Group     BP      Girls Systolic BP Percentile      Girls Diastolic BP Percentile      Boys Systolic BP Percentile      Boys Diastolic BP Percentile      Pulse      Resp      Temp      Temp src      SpO2      Weight      Height      Head Circumference      Peak Flow      Pain Score      Pain Loc      Pain Education      Exclude from Growth Chart     Most recent vital signs: Vitals:   08/07/24 1317 08/07/24 1330  BP: (!) 189/65 (!) 161/65  Pulse: (!) 106 (!) 105  Resp: (!) 28 (!) 23  Temp: 97.6 F (36.4 C)   SpO2: 90% 96%     General: Awake, no distress.  CV:  Good peripheral perfusion.  Resp:  Increased work of breathing with pursed lip breathing tight air exchange slight wheeze Abd:  No distention.  Soft and nontender Other:  1+ edema noted bilaterally   ED Results / Procedures / Treatments   Labs (all labs ordered are listed, but only abnormal results are displayed) Labs Reviewed  BLOOD GAS, VENOUS - Abnormal; Notable for the following components:      Result Value   pO2, Ven 60 (*)    Bicarbonate 28.3 (*)    Acid-Base Excess 2.2  (*)    All other components within normal limits  CBC WITH DIFFERENTIAL/PLATELET - Abnormal; Notable for the following components:   RBC 3.76 (*)    Hemoglobin 11.3 (*)    HCT 35.1 (*)    All other components within normal limits  COMPREHENSIVE METABOLIC PANEL WITH GFR - Abnormal; Notable for the following components:   Glucose, Bld 176 (*)    All other components within normal limits  RESP PANEL BY RT-PCR (RSV, FLU A&B, COVID)  [id number]  PRO BRAIN NATRIURETIC PEPTIDE  PROTIME-INR  APTT  TROPONIN T, HIGH SENSITIVITY  TROPONIN T, HIGH SENSITIVITY     EKG  My interpretation of EKG:  Sinus tachycardia rate 100 without any ST elevation or T wave inversions, normal intervals.  Patient has a 40 bundle branch block.  Similar to priors  RADIOLOGY I have reviewed the xray personally and interpreted small pleural effusion on the left   PROCEDURES:  Critical Care performed: Yes, see critical care procedure note(s)  .1-3 Lead EKG Interpretation  Performed by: Ernest Ronal BRAVO, MD Authorized by: Ernest Ronal BRAVO, MD     Interpretation: abnormal     ECG rate:  100   ECG rate assessment: tachycardic     Rhythm: sinus tachycardia     Ectopy: none     Conduction: normal   .Critical Care  Performed by: Ernest Ronal BRAVO, MD Authorized by: Ernest Ronal BRAVO, MD   Critical care provider statement:    Critical care time (minutes):  30   Critical care was necessary to treat or prevent imminent or life-threatening deterioration of the following conditions:  Respiratory failure   Critical care was time spent personally by me on the following activities:  Development of treatment plan with patient or surrogate, discussions with consultants, evaluation of patient's response to treatment, examination of patient, ordering and review of laboratory studies, ordering and review of radiographic studies, ordering and performing treatments and interventions, pulse oximetry, re-evaluation of patient's condition and  review of old charts    MEDICATIONS ORDERED IN ED: Medications  ipratropium-albuterol  (DUONEB) 0.5-2.5 (3) MG/3ML nebulizer solution 3 mL (3 mLs Nebulization Given 08/07/24 1339)     IMPRESSION / MDM / ASSESSMENT AND PLAN / ED COURSE  I reviewed the triage vital signs and the nursing notes.   Patient's presentation is most consistent with acute presentation with potential threat to life or bodily function.   Patient comes in with significant work of breathing with concerns for COPD tight air exchange patient was placed on BiPAP.  Patient given additional DuoNeb.  Workup was done to evaluate for ACS, CHF, COPD exacerbation  Coags are reassuring VBG reassuring.  CBC reassuring.  CMP reassuring troponin was 19.  Patient was taken off the BiPAP but she reported feeling improved.  She has been taken off the BiPAP and her respiratory status appears overall reassuring but still sounds very tight on examination and she is on increased oxygen  at 4 L satting 90%.  Discussed with patient mission to hospital.  She reports that she feels like her similar self after her COPD so seems more likely related to COPD.  Discussed the possibility of PE but this seems less likely given she now reports significant improvement in symptoms with medications however will get ultrasound just to ensure no DVTs that would put her at higher risk for PE.  Patient was discussed with the hospitalist for admission.  The patient is on the cardiac monitor to evaluate for evidence of arrhythmia and/or significant heart rate changes.      FINAL CLINICAL IMPRESSION(S) / ED DIAGNOSES   Final diagnoses:  COPD exacerbation (HCC)  Acute respiratory failure with hypoxia (HCC)     Rx / DC Orders   ED Discharge Orders     None        Note:  This document was prepared using Dragon voice recognition software and may include unintentional dictation errors.   Ernest Ronal BRAVO, MD 08/07/24 1520  "

## 2024-08-07 NOTE — ED Notes (Signed)
 Per pt request, BiPap taken off and pt placed on 4LNC. Okay per Ernest MD.

## 2024-08-07 NOTE — ED Triage Notes (Signed)
 Pt arrived from home via [facility] d/t SOB x3 weeks but got worse this morning. EMS reports that pt tried albuterol  before calling EMS, but it didn't help. EMS gave pt 2 duonebs and 125mg  Solu-medrol  en route. Pt AO x4 upon arrival. Pt having labored breathing and accessory muscle use upon arrival.

## 2024-08-08 ENCOUNTER — Other Ambulatory Visit: Payer: Self-pay

## 2024-08-08 DIAGNOSIS — J479 Bronchiectasis, uncomplicated: Secondary | ICD-10-CM | POA: Diagnosis not present

## 2024-08-08 DIAGNOSIS — J441 Chronic obstructive pulmonary disease with (acute) exacerbation: Secondary | ICD-10-CM | POA: Diagnosis not present

## 2024-08-08 DIAGNOSIS — J9601 Acute respiratory failure with hypoxia: Secondary | ICD-10-CM

## 2024-08-08 DIAGNOSIS — J9611 Chronic respiratory failure with hypoxia: Secondary | ICD-10-CM | POA: Diagnosis not present

## 2024-08-08 LAB — CBC
HCT: 32.1 % — ABNORMAL LOW (ref 36.0–46.0)
Hemoglobin: 10.6 g/dL — ABNORMAL LOW (ref 12.0–15.0)
MCH: 30.2 pg (ref 26.0–34.0)
MCHC: 33 g/dL (ref 30.0–36.0)
MCV: 91.5 fL (ref 80.0–100.0)
Platelets: 314 K/uL (ref 150–400)
RBC: 3.51 MIL/uL — ABNORMAL LOW (ref 3.87–5.11)
RDW: 13.2 % (ref 11.5–15.5)
WBC: 3.9 K/uL — ABNORMAL LOW (ref 4.0–10.5)
nRBC: 0 % (ref 0.0–0.2)

## 2024-08-08 LAB — MRSA NEXT GEN BY PCR, NASAL: MRSA by PCR Next Gen: NOT DETECTED

## 2024-08-08 LAB — PROCALCITONIN: Procalcitonin: 0.13 ng/mL

## 2024-08-08 MED ORDER — METOPROLOL TARTRATE 25 MG PO TABS: 12.5000 mg | ORAL_TABLET | Freq: Two times a day (BID) | ORAL | Status: DC

## 2024-08-08 MED ORDER — METOPROLOL TARTRATE 25 MG PO TABS
12.5000 mg | ORAL_TABLET | Freq: Two times a day (BID) | ORAL | 0 refills | Status: DC
  Filled 2024-08-08: qty 30, 30d supply, fill #0

## 2024-08-08 MED ORDER — AZELASTINE HCL 0.1 % NA SOLN
2.0000 | Freq: Two times a day (BID) | NASAL | Status: DC
  Administered 2024-08-08: 2 via NASAL
  Filled 2024-08-08: qty 30

## 2024-08-08 MED ORDER — DOXYCYCLINE HYCLATE 100 MG PO TABS
100.0000 mg | ORAL_TABLET | Freq: Two times a day (BID) | ORAL | 0 refills | Status: DC
  Filled 2024-08-08: qty 10, 5d supply, fill #0

## 2024-08-08 MED ORDER — PREDNISONE 10 MG PO TABS
ORAL_TABLET | Freq: Every day | ORAL | 0 refills | Status: DC
  Filled 2024-08-08: qty 40, 19d supply, fill #0

## 2024-08-08 MED ORDER — PREDNISONE 10 MG PO TABS: ORAL_TABLET | Freq: Every day | ORAL | 0 refills | Status: AC

## 2024-08-08 MED ORDER — GUAIFENESIN-DM 100-10 MG/5ML PO SYRP
5.0000 mL | ORAL_SOLUTION | ORAL | Status: DC | PRN
  Administered 2024-08-08: 5 mL via ORAL
  Filled 2024-08-08: qty 10

## 2024-08-08 MED ORDER — DOXYCYCLINE HYCLATE 100 MG PO TABS: 100.0000 mg | ORAL_TABLET | Freq: Two times a day (BID) | ORAL | 0 refills | Status: AC

## 2024-08-08 MED ORDER — METOPROLOL TARTRATE 25 MG PO TABS: 12.5000 mg | ORAL_TABLET | Freq: Two times a day (BID) | ORAL | 0 refills | Status: AC

## 2024-08-08 NOTE — Plan of Care (Signed)
" °  Problem: Education: Goal: Knowledge of General Education information will improve Description: Including pain rating scale, medication(s)/side effects and non-pharmacologic comfort measures 08/08/2024 1206 by Lynnette Cena CROME, RN Outcome: Progressing 08/08/2024 1203 by Lynnette Cena CROME, RN Outcome: Progressing   Problem: Health Behavior/Discharge Planning: Goal: Ability to manage health-related needs will improve 08/08/2024 1206 by Lynnette Cena CROME, RN Outcome: Progressing 08/08/2024 1203 by Lynnette Cena CROME, RN Outcome: Progressing   "

## 2024-08-08 NOTE — Evaluation (Signed)
 Physical Therapy Evaluation Patient Details Name: Misty Ferguson MRN: [id number] DOB: 04/18/1934 Today's Date: 08/08/2024  History of Present Illness  Pt is an 89 y.o. female with medical history significant of advanced COPD, with chronic hypoxic respiratory failure usually on 2 to 3 L continuously, HTN, sinus tachycardia on verapamil , bladder cancer, PVD status post multiple interventions, presented with worsening of cough wheezing shortness of breath.  MD assessment includes: acute on chronic hypoxic respiratory failure, acute on chronic COPD exacerbation, question of new onset of CHF, and severe protein calorie malnutrition.   Clinical Impression  Pt was pleasant and motivated to participate during the session and put forth good effort throughout. Pt found on 3LO2/min with SpO2 in the low to mid 90s and HR in the 110's to low 120's.  Pt required no physical assistance during the session but did require extra time and effort with functional tasks and became moderately SOB with minimal exertion.  Upon coming to sitting at the EOB pt requested O2 be increased from 3L back to 4L due to SOB with cues given for PLB (pt often found breathing through her mouth with exertion).  Pt's SpO2 dropped to a low of 88% during the session after limited ambulation from bed to chair but quickly increased back to the low 90s with O2 returned to 3L at end of session with pt's breathing back to baseline, nursing/MD notified.  Pt will benefit from continued PT services upon discharge to safely address deficits listed in patient problem list for decreased caregiver assistance and eventual return to PLOF.          If plan is discharge home, recommend the following: A lot of help with walking and/or transfers;A little help with bathing/dressing/bathroom;Assistance with cooking/housework;Assist for transportation;Help with stairs or ramp for entrance   Can travel by private vehicle        Equipment Recommendations  None recommended by PT  Recommendations for Other Services       Functional Status Assessment Patient has had a recent decline in their functional status and demonstrates the ability to make significant improvements in function in a reasonable and predictable amount of time.     Precautions / Restrictions Precautions Precautions: Fall Restrictions Weight Bearing Restrictions Per Provider Order: No      Mobility  Bed Mobility Overal bed mobility: Modified Independent             General bed mobility comments: Min extra time and effort only    Transfers Overall transfer level: Needs assistance Equipment used: Rolling walker (2 wheels) Transfers: Sit to/from Stand Sit to Stand: Contact guard assist           General transfer comment: Pt required extra effort to come to standing but no physical assist and was generally steady upon initial stand    Ambulation/Gait Ambulation/Gait assistance: Contact guard assist Gait Distance (Feet): 3 Feet Assistive device: Rolling walker (2 wheels) Gait Pattern/deviations: Step-through pattern, Decreased step length - right, Decreased step length - left, Trunk flexed, Shuffle Gait velocity: decreased     General Gait Details: Pt able to take several steps at the EOB and from bed to chair with effortful, shuffling steps and heavy trunk flexion but no overt LOB noted  Stairs            Wheelchair Mobility     Tilt Bed    Modified Rankin (Stroke Patients Only)       Balance Overall balance assessment: Needs assistance   Sitting balance-Leahy  Scale: Normal     Standing balance support: Bilateral upper extremity supported, During functional activity, Reliant on assistive device for balance Standing balance-Leahy Scale: Fair                               Pertinent Vitals/Pain Pain Assessment Pain Assessment: No/denies pain    Home Living Family/patient expects to be discharged to:: Private  residence Living Arrangements: Spouse/significant other Available Help at Discharge: Available 24 hours/day;Family Type of Home: House Home Access: Stairs to enter Entrance Stairs-Rails: None Entrance Stairs-Number of Steps: 1   Home Layout: One level Home Equipment: Agricultural Consultant (2 wheels);Cane - single point;BSC/3in1;Transport chair      Prior Function Prior Level of Function : Needs assist             Mobility Comments: SBA with household ambulation with a RW, transport chair for community access including up/down the one step to enter the home, 1 fall in the last 6 months ADLs Comments: Spouse assists with ADLs     Extremity/Trunk Assessment   Upper Extremity Assessment Upper Extremity Assessment: Generalized weakness    Lower Extremity Assessment Lower Extremity Assessment: Generalized weakness       Communication   Communication Communication: No apparent difficulties    Cognition Arousal: Alert Behavior During Therapy: WFL for tasks assessed/performed   PT - Cognitive impairments: No apparent impairments                         Following commands: Intact       Cueing Cueing Techniques: Verbal cues, Tactile cues     General Comments      Exercises     Assessment/Plan    PT Assessment Patient needs continued PT services  PT Problem List Decreased strength;Decreased activity tolerance;Decreased balance;Decreased mobility;Decreased knowledge of use of DME       PT Treatment Interventions DME instruction;Gait training;Functional mobility training;Therapeutic activities;Therapeutic exercise;Balance training;Patient/family education    PT Goals (Current goals can be found in the Care Plan section)  Acute Rehab PT Goals Patient Stated Goal: To return home PT Goal Formulation: With patient Time For Goal Achievement: 08/21/24 Potential to Achieve Goals: Good    Frequency Min 2X/week     Co-evaluation               AM-PAC  PT 6 Clicks Mobility  Outcome Measure Help needed turning from your back to your side while in a flat bed without using bedrails?: None Help needed moving from lying on your back to sitting on the side of a flat bed without using bedrails?: A Little Help needed moving to and from a bed to a chair (including a wheelchair)?: A Little Help needed standing up from a chair using your arms (e.g., wheelchair or bedside chair)?: A Little Help needed to walk in hospital room?: A Lot Help needed climbing 3-5 steps with a railing? : A Lot 6 Click Score: 17    End of Session Equipment Utilized During Treatment: Gait belt;Oxygen  Activity Tolerance: Other (comment) (limited by SOB with exertion) Patient left: in chair;with call bell/phone within reach;with chair alarm set;with family/visitor present Nurse Communication: Mobility status;Other (comment) (Nursing/MD notified of pt's SOB with exertion and request for increased O2 from 3 to 4L with exertion) PT Visit Diagnosis: Unsteadiness on feet (R26.81);History of falling (Z91.81);Difficulty in walking, not elsewhere classified (R26.2);Muscle weakness (generalized) (M62.81)    Time: 9063-8983  PT Time Calculation (min) (ACUTE ONLY): 40 min   Charges:   PT Evaluation $PT Eval Moderate Complexity: 1 Mod PT Treatments $Therapeutic Activity: 8-22 mins PT General Charges $$ ACUTE PT VISIT: 1 Visit    D. Scott Jalynne Persico PT, DPT 08/08/2024, 11:23 AM

## 2024-08-08 NOTE — TOC Progression Note (Signed)
 Transition of Care [facility]) - Progression Note    Patient Details  Name: Misty Ferguson MRN: [id number] Date of Birth: 16-Jun-1934  Transition [facility]) CM/SW Contact  Racheal LITTIE Schimke, RN Phone Number: 08/08/2024, 4:26 PM  Clinical Narrative: In attempt to deliver CODE44, patient was already discharged. RN states she forgot patient needed it.                       Expected Discharge Plan and Services         Expected Discharge Date: 08/08/24                                     Social Drivers of Health (SDOH) Interventions SDOH Screenings   Food Insecurity: No Food Insecurity (05/02/2024)  Housing: Low Risk (05/02/2024)  Transportation Needs: No Transportation Needs (05/02/2024)  Utilities: Not At Risk (05/02/2024)  Alcohol Screen: Low Risk (05/01/2024)  Depression (PHQ2-9): Low Risk (06/10/2024)  Financial Resource Strain: Low Risk (05/01/2024)  Physical Activity: Inactive (05/01/2024)  Social Connections: Socially Isolated (05/01/2024)  Stress: No Stress Concern Present (05/01/2024)  Tobacco Use: Medium Risk (07/21/2024)  Health Literacy: Inadequate Health Literacy (05/02/2024)    Readmission Risk Interventions     No data to display

## 2024-08-08 NOTE — Plan of Care (Signed)
   Problem: Education: Goal: Knowledge of General Education information will improve Description Including pain rating scale, medication(s)/side effects and non-pharmacologic comfort measures Outcome: Progressing   Problem: Health Behavior/Discharge Planning: Goal: Ability to manage health-related needs will improve Outcome: Progressing

## 2024-08-08 NOTE — Plan of Care (Signed)
 " Problem: Education: Goal: Knowledge of General Education information will improve Description: Including pain rating scale, medication(s)/side effects and non-pharmacologic comfort measures 08/08/2024 1510 by Lynnette Cena CROME, RN Outcome: Adequate for Discharge 08/08/2024 1206 by Lynnette Cena CROME, RN Outcome: Progressing 08/08/2024 1203 by Lynnette Cena CROME, RN Outcome: Progressing   Problem: Health Behavior/Discharge Planning: Goal: Ability to manage health-related needs will improve 08/08/2024 1510 by Lynnette Cena CROME, RN Outcome: Adequate for Discharge 08/08/2024 1206 by Lynnette Cena CROME, RN Outcome: Progressing 08/08/2024 1203 by Lynnette Cena CROME, RN Outcome: Progressing   Problem: Clinical Measurements: Goal: Ability to maintain clinical measurements within normal limits will improve Outcome: Adequate for Discharge Goal: Will remain free from infection Outcome: Adequate for Discharge Goal: Diagnostic test results will improve Outcome: Adequate for Discharge Goal: Respiratory complications will improve Outcome: Adequate for Discharge Goal: Cardiovascular complication will be avoided Outcome: Adequate for Discharge   Problem: Activity: Goal: Risk for activity intolerance will decrease Outcome: Adequate for Discharge   Problem: Nutrition: Goal: Adequate nutrition will be maintained Outcome: Adequate for Discharge   Problem: Education: Goal: Knowledge of General Education information will improve Description: Including pain rating scale, medication(s)/side effects and non-pharmacologic comfort measures 08/08/2024 1510 by Lynnette Cena CROME, RN Outcome: Adequate for Discharge 08/08/2024 1206 by Lynnette Cena CROME, RN Outcome: Progressing 08/08/2024 1203 by Lynnette Cena CROME, RN Outcome: Progressing   Problem: Health Behavior/Discharge Planning: Goal: Ability to manage health-related needs will improve 08/08/2024 1510 by Lynnette Cena CROME, RN Outcome: Adequate  for Discharge 08/08/2024 1206 by Lynnette Cena CROME, RN Outcome: Progressing 08/08/2024 1203 by Lynnette Cena CROME, RN Outcome: Progressing   Problem: Clinical Measurements: Goal: Ability to maintain clinical measurements within normal limits will improve Outcome: Adequate for Discharge Goal: Will remain free from infection Outcome: Adequate for Discharge Goal: Diagnostic test results will improve Outcome: Adequate for Discharge Goal: Respiratory complications will improve Outcome: Adequate for Discharge Goal: Cardiovascular complication will be avoided Outcome: Adequate for Discharge   Problem: Activity: Goal: Risk for activity intolerance will decrease Outcome: Adequate for Discharge   Problem: Nutrition: Goal: Adequate nutrition will be maintained Outcome: Adequate for Discharge   Problem: Coping: Goal: Level of anxiety will decrease Outcome: Adequate for Discharge   Problem: Elimination: Goal: Will not experience complications related to bowel motility Outcome: Adequate for Discharge Goal: Will not experience complications related to urinary retention Outcome: Adequate for Discharge   Problem: Pain Managment: Goal: General experience of comfort will improve and/or be controlled Outcome: Adequate for Discharge   Problem: Safety: Goal: Ability to remain free from injury will improve Outcome: Adequate for Discharge   Problem: Skin Integrity: Goal: Risk for impaired skin integrity will decrease Outcome: Adequate for Discharge   Problem: Education: Goal: Knowledge of disease or condition will improve Outcome: Adequate for Discharge Goal: Knowledge of the prescribed therapeutic regimen will improve Outcome: Adequate for Discharge Goal: Individualized Educational Video(s) Outcome: Adequate for Discharge   Problem: Activity: Goal: Ability to tolerate increased activity will improve Outcome: Adequate for Discharge Goal: Will verbalize the importance of balancing  activity with adequate rest periods Outcome: Adequate for Discharge   Problem: Respiratory: Goal: Ability to maintain a clear airway will improve Outcome: Adequate for Discharge Goal: Levels of oxygenation will improve Outcome: Adequate for Discharge Goal: Ability to maintain adequate ventilation will improve Outcome: Adequate for Discharge   Problem: Acute Rehab PT Goals(only PT should resolve) Goal: Pt Will Go Sit To Supine/Side Outcome: Adequate for Discharge Goal: Pt Will Transfer Bed To Chair/Chair  To Bed Outcome: Adequate for Discharge Goal: Pt Will Ambulate Outcome: Adequate for Discharge   "

## 2024-08-08 NOTE — Progress Notes (Signed)
 DISCHARGE NOTE HOME [name]  to be discharged Home per MD order. Discussed prescriptions and follow up appointments with the patient. Prescriptions given to patient; medication list explained in detail. Patient verbalized understanding.   Skin clean, dry and intact without evidence of skin break down, no evidence of skin tears noted. IV catheter discontinued intact. Site without signs and symptoms of complications. Dressing and pressure applied. Pt denies pain at the site currently. No complaints noted.   Patient free of lines, drains, and wounds.   An After Visit Summary (AVS) was printed and given to the patient.

## 2024-08-08 NOTE — Discharge Summary (Addendum)
 " Physician Discharge Summary   Patient: Misty Ferguson MRN: [id number] DOB: 05-May-1934  Admit date:     08/07/2024  Discharge date: 08/08/2024  Discharge Physician: Amaryllis Dare   PCP: Avelina Greig BRAVO, MD   Recommendations at discharge:  Please obtain CBC and BMP on follow-up Follow-up with pulmonology within a week Patient can get echocardiogram as outpatient by her cardiologist-advised to follow-up Follow-up with primary care provider  Discharge Diagnoses: Active Problems:   COPD with acute exacerbation (HCC)   Chronic hypoxemic respiratory failure (HCC)   Acute respiratory failure with hypoxia (HCC)   COPD exacerbation (HCC)   Bronchiectasis without complication [facility])   Hospital Course: Partly taken from H&P.  Misty Ferguson is a 89 y.o. female with medical history significant of advanced COPD, with chronic hypoxic respiratory failure usually on 2 to 3 L continuously, HTN, sinus tachycardia on verapamil , bladder cancer, PVD status post multiple interventions, presented with worsening of cough wheezing shortness of breath.  Patient was having symptoms for more than 3 weeks, received 2 courses of p.o. prednisone  with no significant improvement. Pulmonary also started patient on Roflumilast  last week.   On presentation elevated blood pressure at 180/60, saturating 90% on 5 L of oxygen .VBG 7.37/49/60, WBC 10.4 hemoglobin 11.3 BUN 10 creatinine 0.6 glucose 176.  Chest x-ray with no significant infiltrate, did show a new left-sided pleural effusion. Patient was started on BiPAP due to significant increased work of breathing but unable to tolerate it.  12/27: Vital stable except mild sinus tachycardia, saturating well on 3 to 4 L of oxygen  which is her baseline.  Wheezing improved.  Thoracentesis was ordered but patient refused stating that she already had a thoracentesis done in August and does not want it anymore.  She wants to follow-up with her on pulmonologist.  She was requesting to  go home stating that this hospital is giving me anxiety and I am feeling much improved. Echocardiogram was ordered by admitting provider but unable to obtain it as patient does not want to wait.  Patient was advised to follow-up with her cardiologist, pro BNP was normal.  Clinically appears euvolemic.  Patient was given a little longer tapering course of prednisone  as she already received 2 short courses.  She was also given 5-day course of doxycycline  for concern of COPD exacerbation.  We added low-dose metoprolol  for better control of heart rate along with her home verapamil .  She will continue her current medications and need to have a close follow-up with her providers for further assistance.  Patient will remain high risk for readmission and mortality based on advanced age and underlying significant comorbidities with advanced COPD.  Physical therapist evaluated her but patient declined all home health services.   Consultants: None Procedures performed: None Disposition: Home Diet recommendation:  Regular diet DISCHARGE MEDICATION: Allergies as of 08/08/2024   No Known Allergies      Medication List     STOP taking these medications    NAC 600 PO       TAKE these medications    albuterol  108 (90 Base) MCG/ACT inhaler Commonly known as: VENTOLIN  HFA Inhale 2 puffs into the lungs every 6 (six) hours as needed for wheezing or shortness of breath.   albuterol  (2.5 MG/3ML) 0.083% nebulizer solution Commonly known as: PROVENTIL  INHALE THE CONTENTS OF 1 VIAL VIA NEBULIZER EVERY 4 HOURS AS NEEDED FOR WHEEZING OR SHORTNESS OF BREATH.   ascorbic acid 1000 MG tablet Commonly known as: VITAMIN C Take 1,000  mg by mouth 2 (two) times daily.   atorvastatin  10 MG tablet Commonly known as: LIPITOR TAKE 1 TABLET EVERY DAY   Azelastine  HCl 137 MCG/SPRAY Soln Place 2 sprays into both nostrils 2 (two) times daily.   Breztri  Aerosphere 160-9-4.8 MCG/ACT Aero inhaler Generic  drug: budesonide -glycopyrrolate -formoterol  Inhale 2 puffs into the lungs 2 (two) times daily. What changed: Another medication with the same name was removed. Continue taking this medication, and follow the directions you see here.   clopidogrel  75 MG tablet Commonly known as: PLAVIX  TAKE 1 TABLET EVERY DAY   doxycycline  100 MG tablet Commonly known as: VIBRA -TABS Take 1 tablet (100 mg total) by mouth every 12 (twelve) hours for 5 days.   guaiFENesin  600 MG 12 hr tablet Commonly known as: MUCINEX  Take 600 mg by mouth 2 (two) times daily.   Hydrofera Blue 4x4 Pads Apply to knee ulcer.   melatonin 5 MG Tabs Take 1 tablet (5 mg total) by mouth at bedtime.   metoprolol  tartrate 25 MG tablet Commonly known as: LOPRESSOR  Take 1/2 tablets (12.5 mg total) by mouth 2 (two) times daily.   OXYGEN  Inhale 4 L into the lungs daily in the afternoon. What changed: additional instructions   pantoprazole  40 MG tablet Commonly known as: PROTONIX  Take 1 tablet (40 mg total) by mouth daily.   Pari LC Sprint Nebulizer Set Misc   polyethylene glycol 17 g packet Commonly known as: MIRALAX  / GLYCOLAX  Take 17 g by mouth daily.   predniSONE  10 MG tablet Commonly known as: DELTASONE  Take 4 tablets (40 mg total) by mouth daily for next 4 days, decrease by 1 tablet every third day until you finish all the course. What changed:  how much to take when to take this additional instructions Another medication with the same name was removed. Continue taking this medication, and follow the directions you see here.   PreserVision AREDS Tabs Take 1 tablet by mouth 2 (two) times daily.   roflumilast  500 MCG Tabs tablet Commonly known as: DALIRESP  Take 1 tablet (500 mcg total) by mouth daily.   senna-docusate 8.6-50 MG tablet Commonly known as: Senokot-S Take 1 tablet by mouth 2 (two) times daily as needed for mild constipation.   traZODone  50 MG tablet Commonly known as: DESYREL  TAKE 0.5-1  TABLETS BY MOUTH AT BEDTIME AS NEEDED FOR SLEEP.   verapamil  360 MG 24 hr capsule Commonly known as: VERELAN  Take 360 mg by mouth daily.   Vitamin D  125 MCG (5000 UT) Caps Take 5,000 Units by mouth daily.   Vitamin E 180 MG Caps Take 180 mg by mouth daily.        Follow-up Information     Avelina Greig BRAVO, MD. Schedule an appointment as soon as possible for a visit in 1 week(s).   Specialty: Family Medicine Contact information: [address] [place] [address] [phone]         Isaiah Scrivener, MD. Schedule an appointment as soon as possible for a visit in 1 week(s).   Specialties: Pulmonary Disease, Cardiology Contact information: [address] [place] [address] [phone]                Discharge Exam: Fredricka Weights   08/07/24 1318  Weight: 41.7 kg   General.  Frail and severely malnourished elderly lady, in no acute distress. Pulmonary.  Lungs clear bilaterally, normal respiratory effort.  Slightly decreased breath sounds at bases. CV.  Regular rate and rhythm, no  JVD, rub or murmur. Abdomen.  Soft, nontender, nondistended, BS positive. CNS.  Alert and oriented .  No focal neurologic deficit. Extremities.  No edema,  pulses intact and symmetrical. Psychiatry.  Judgment and insight appears normal.   Condition at discharge: stable  The results of significant diagnostics from this hospitalization (including imaging, microbiology, ancillary and laboratory) are listed below for reference.   Imaging Studies: US  Venous Img Lower Bilateral Result Date: 08/07/2024 CLINICAL DATA:  89 year old female with bilateral lower extremity swelling. EXAM: BILATERAL LOWER EXTREMITY VENOUS DOPPLER ULTRASOUND TECHNIQUE: Gray-scale sonography with graded compression, as well as color Doppler and duplex ultrasound were performed to evaluate the lower extremity deep venous systems from the level of the common femoral vein and including the common  femoral, femoral, profunda femoral, popliteal and calf veins including the posterior tibial, peroneal and gastrocnemius veins when visible. The superficial great saphenous vein was also interrogated. Spectral Doppler was utilized to evaluate flow at rest and with distal augmentation maneuvers in the common femoral, femoral and popliteal veins. COMPARISON:  None Available. FINDINGS: RIGHT LOWER EXTREMITY Common Femoral Vein: No evidence of thrombus. Normal compressibility, respiratory phasicity and response to augmentation. Saphenofemoral Junction: No evidence of thrombus. Normal compressibility and flow on color Doppler imaging. Profunda Femoral Vein: No evidence of thrombus. Normal compressibility and flow on color Doppler imaging. Femoral Vein: No evidence of thrombus. Normal compressibility, respiratory phasicity and response to augmentation. Popliteal Vein: No evidence of thrombus. Normal compressibility, respiratory phasicity and response to augmentation. Calf Veins: No evidence of thrombus. Normal compressibility and flow on color Doppler imaging. Other Findings:  None. LEFT LOWER EXTREMITY Common Femoral Vein: No evidence of thrombus. Normal compressibility, respiratory phasicity and response to augmentation. Saphenofemoral Junction: No evidence of thrombus. Normal compressibility and flow on color Doppler imaging. Profunda Femoral Vein: No evidence of thrombus. Normal compressibility and flow on color Doppler imaging. Femoral Vein: No evidence of thrombus. Normal compressibility, respiratory phasicity and response to augmentation. Popliteal Vein: No evidence of thrombus. Normal compressibility, respiratory phasicity and response to augmentation. Calf Veins: No evidence of thrombus. Normal compressibility and flow on color Doppler imaging. Other Findings:  None. IMPRESSION: No evidence of bilateral lower extremity deep venous thrombosis. Ester Sides, MD [facility]  [facility] Electronically Signed   By: Ester Sides M.D.   On: 08/07/2024 15:38   DG Chest Portable 1 View Result Date: 08/07/2024 EXAM: 1 VIEW(S) XRAY OF THE CHEST 08/07/2024 01:40:00 PM COMPARISON: 03/18/2024 and previous. CLINICAL HISTORY: sob FINDINGS: LUNGS AND PLEURA: New left pleural effusion with associated left basilar opacity. No pneumothorax. HEART AND MEDIASTINUM: Aortic atherosclerosis. BONES AND SOFT TISSUES: Remote left rib fractures. Chronic sclerotic focus in left humeral head, bone island. IMPRESSION: 1. New left pleural effusion with associated left basilar opacity. Electronically signed by: Dayne Hassell MD 08/07/2024 02:13 PM EST RP Workstation: [id number]    Microbiology: Results for orders placed or performed during the hospital encounter of 08/07/24  Resp panel by RT-PCR (RSV, Flu A&B, Covid) Anterior Nasal Swab     Status: None   Collection Time: 08/07/24  1:28 PM   Specimen: Anterior Nasal Swab  Result Value Ref Range Status   SARS Coronavirus 2 by RT PCR NEGATIVE NEGATIVE Final    Comment: (NOTE) SARS-CoV-2 target nucleic acids are NOT DETECTED.  The SARS-CoV-2 RNA is generally detectable in upper respiratory specimens during the acute phase of infection. The lowest concentration of SARS-CoV-2 viral copies this assay can detect is 138 copies/mL.  A negative result does not preclude SARS-Cov-2 infection and should not be used as the sole basis for treatment or other patient management decisions. A negative result may occur with  improper specimen collection/handling, submission of specimen other than nasopharyngeal swab, presence of viral mutation(s) within the areas targeted by this assay, and inadequate number of viral copies(<138 copies/mL). A negative result must be combined with clinical observations, patient history, and epidemiological information. The expected result is Negative.  Fact Sheet for Patients:   bloggercourse.com  Fact Sheet for Healthcare Providers:  seriousbroker.it  This test is no t yet approved or cleared by the [place]  FDA and  has been authorized for detection and/or diagnosis of SARS-CoV-2 by FDA under an Emergency Use Authorization (EUA). This EUA will remain  in effect (meaning this test can be used) for the duration of the COVID-19 declaration under Section 564(b)(1) of the Act, 21 U.S.C.section 360bbb-3(b)(1), unless the authorization is terminated  or revoked sooner.       Influenza A by PCR NEGATIVE NEGATIVE Final   Influenza B by PCR NEGATIVE NEGATIVE Final    Comment: (NOTE) The Xpert Xpress SARS-CoV-2/FLU/RSV plus assay is intended as an aid in the diagnosis of influenza from Nasopharyngeal swab specimens and should not be used as a sole basis for treatment. Nasal washings and aspirates are unacceptable for Xpert Xpress SARS-CoV-2/FLU/RSV testing.  Fact Sheet for Patients: bloggercourse.com  Fact Sheet for Healthcare Providers: seriousbroker.it  This test is not yet approved or cleared by the [place]  FDA and has been authorized for detection and/or diagnosis of SARS-CoV-2 by FDA under an Emergency Use Authorization (EUA). This EUA will remain in effect (meaning this test can be used) for the duration of the COVID-19 declaration under Section 564(b)(1) of the Act, 21 U.S.C. section 360bbb-3(b)(1), unless the authorization is terminated or revoked.     Resp Syncytial Virus by PCR NEGATIVE NEGATIVE Final    Comment: (NOTE) Fact Sheet for Patients: bloggercourse.com  Fact Sheet for Healthcare Providers: seriousbroker.it  This test is not yet approved or cleared by the [place]  FDA and has been authorized for detection and/or diagnosis of SARS-CoV-2 by FDA under an Emergency Use  Authorization (EUA). This EUA will remain in effect (meaning this test can be used) for the duration of the COVID-19 declaration under Section 564(b)(1) of the Act, 21 U.S.C. section 360bbb-3(b)(1), unless the authorization is terminated or revoked.  Performed at [facility], [address] [place], [place] [address]   Respiratory (~20 pathogens) panel by PCR     Status: None   Collection Time: 08/07/24  4:05 PM   Specimen: Nasopharyngeal Swab; Respiratory  Result Value Ref Range Status   Adenovirus NOT DETECTED NOT DETECTED Final   Coronavirus 229E NOT DETECTED NOT DETECTED Final    Comment: (NOTE) The Coronavirus on the Respiratory Panel, DOES NOT test for the novel  Coronavirus (2019 nCoV)    Coronavirus HKU1 NOT DETECTED NOT DETECTED Final   Coronavirus NL63 NOT DETECTED NOT DETECTED Final   Coronavirus OC43 NOT DETECTED NOT DETECTED Final   Metapneumovirus NOT DETECTED NOT DETECTED Final   Rhinovirus / Enterovirus NOT DETECTED NOT DETECTED Final   Influenza A NOT DETECTED NOT DETECTED Final   Influenza B NOT DETECTED NOT DETECTED Final   Parainfluenza Virus 1 NOT DETECTED NOT DETECTED Final   Parainfluenza Virus 2 NOT DETECTED NOT DETECTED Final   Parainfluenza Virus 3 NOT DETECTED NOT DETECTED Final   Parainfluenza Virus 4 NOT DETECTED  NOT DETECTED Final   Respiratory Syncytial Virus NOT DETECTED NOT DETECTED Final   Bordetella pertussis NOT DETECTED NOT DETECTED Final   Bordetella Parapertussis NOT DETECTED NOT DETECTED Final   Chlamydophila pneumoniae NOT DETECTED NOT DETECTED Final   Mycoplasma pneumoniae NOT DETECTED NOT DETECTED Final    Comment: Performed at [facility], [address], [place], [place] [address]  MRSA Next Gen by PCR, Nasal     Status: None   Collection Time: 08/08/24 11:38 AM   Specimen: Nasal Mucosa; Nasal Swab  Result Value Ref Range Status   MRSA by PCR Next Gen NOT DETECTED NOT DETECTED Final    Comment: (NOTE) The  GeneXpert MRSA Assay (FDA approved for NASAL specimens only), is one component of a comprehensive MRSA colonization surveillance program. It is not intended to diagnose MRSA infection nor to guide or monitor treatment for MRSA infections. Test performance is not FDA approved in patients less than 20 years old. Performed at [facility], [address] [place], [place] [address]     Labs: CBC: Recent Labs  Lab 08/07/24 1328 08/08/24 0932  WBC 10.4 3.9*  NEUTROABS 7.6  --   HGB 11.3* 10.6*  HCT 35.1* 32.1*  MCV 93.4 91.5  PLT 383 314   Basic Metabolic Panel: Recent Labs  Lab 08/07/24 1328  NA 142  K 3.7  CL 102  CO2 26  GLUCOSE 176*  BUN 10  CREATININE 0.62  CALCIUM  9.5   Liver Function Tests: Recent Labs  Lab 08/07/24 1328  AST 22  ALT 22  ALKPHOS 108  BILITOT 0.3  PROT 6.7  ALBUMIN 4.3   CBG: No results for input(s): GLUCAP in the last 168 hours.  Discharge time spent: greater than 30 minutes.  This record has been created using Conservation officer, historic buildings. Errors have been sought and corrected,but may not always be located. Such creation errors do not reflect on the standard of care.   Signed: Amaryllis Dare, MD [facility] 08/08/2024 "

## 2024-08-08 NOTE — Hospital Course (Addendum)
 Partly taken from H&P.  Misty Ferguson is a 89 y.o. female with medical history significant of advanced COPD, with chronic hypoxic respiratory failure usually on 2 to 3 L continuously, HTN, sinus tachycardia on verapamil , bladder cancer, PVD status post multiple interventions, presented with worsening of cough wheezing shortness of breath.  Patient was having symptoms for more than 3 weeks, received 2 courses of p.o. prednisone  with no significant improvement. Pulmonary also started patient on Roflumilast  last week.   On presentation elevated blood pressure at 180/60, saturating 90% on 5 L of oxygen .VBG 7.37/49/60, WBC 10.4 hemoglobin 11.3 BUN 10 creatinine 0.6 glucose 176.  Chest x-ray with no significant infiltrate, did show a new left-sided pleural effusion. Patient was started on BiPAP due to significant increased work of breathing but unable to tolerate it.  12/27: Vital stable except mild sinus tachycardia, saturating well on 3 to 4 L of oxygen  which is her baseline.  Wheezing improved.  Thoracentesis was ordered but patient refused stating that she already had a thoracentesis done in August and does not want it anymore.  She wants to follow-up with her on pulmonologist.  She was requesting to go home stating that this hospital is giving me anxiety and I am feeling much improved. Echocardiogram was ordered by admitting provider but unable to obtain it as patient does not want to wait.  Patient was advised to follow-up with her cardiologist, pro BNP was normal.  Clinically appears euvolemic.  Patient was given a little longer tapering course of prednisone  as she already received 2 short courses.  She was also given 5-day course of doxycycline  for concern of COPD exacerbation.  We added low-dose metoprolol  for better control of heart rate along with her home verapamil .  She will continue her current medications and need to have a close follow-up with her providers for further  assistance.  Patient will remain high risk for readmission and mortality based on advanced age and underlying significant comorbidities with advanced COPD.  Physical therapist evaluated her but patient declined all home health services.

## 2024-08-10 ENCOUNTER — Telehealth: Payer: Self-pay

## 2024-08-10 ENCOUNTER — Encounter: Payer: Self-pay | Admitting: Internal Medicine

## 2024-08-10 NOTE — Transitions of Care (Post Inpatient/ED Visit) (Signed)
 "  08/10/2024  Name: Misty Ferguson MRN: [id number] DOB: Dec 20, 1933  Today's TOC FU Call Status: Today's TOC FU Call Status:: Successful TOC FU Call Completed TOC FU Call Complete Date: 08/10/24  Patient's Name and Date of Birth confirmed. DOB, Name  Transition Care Management Follow-up Telephone Call Date of Discharge: 08/08/24 Discharge Facility: [facility]) Type of Discharge: Inpatient Admission Primary Inpatient Discharge Diagnosis:: COPD Exacerbation How have you been since you were released from the hospital?: Better Any questions or concerns?: No  Items Reviewed: Did you receive and understand the discharge instructions provided?: Yes Medications obtained,verified, and reconciled?: Yes (Medications Reviewed) Any new allergies since your discharge?: No Dietary orders reviewed?: NA Do you have support at home?: Yes People in Home [RPT]: spouse  Medications Reviewed Today: Medications Reviewed Today     Reviewed by Lavelle Charmaine NOVAK, LPN (Licensed Practical Nurse) on 08/10/24 at 1246  Med List Status: <None>   Medication Order Taking? Sig Documenting Provider Last Dose Status Informant  albuterol  (PROVENTIL ) (2.5 MG/3ML) 0.083% nebulizer solution [id number] No INHALE THE CONTENTS OF 1 VIAL VIA NEBULIZER EVERY 4 HOURS AS NEEDED FOR WHEEZING OR SHORTNESS OF BREATH. Kasa, Kurian, MD 08/07/2024 Morning Active Self, Spouse/Significant Other, Pharmacy Records  albuterol  (VENTOLIN  HFA) 108 (90 Base) MCG/ACT inhaler [id number] No Inhale 2 puffs into the lungs every 6 (six) hours as needed for wheezing or shortness of breath. Kasa, Kurian, MD 08/07/2024 Morning Active Self, Spouse/Significant Other, Pharmacy Records  ascorbic acid (VITAMIN C) 1000 MG tablet [id number] No Take 1,000 mg by mouth 2 (two) times daily. [provider] 08/07/2024 Morning Active Self, Spouse/Significant Other, Pharmacy Records  atorvastatin  (LIPITOR) 10 MG tablet [id number] No  TAKE 1 TABLET EVERY DAY Dew, Selinda RAMAN, MD 08/07/2024 Morning Active Self, Spouse/Significant Other, Pharmacy Records  Azelastine  HCl 137 MCG/SPRAY SOLN [id number] No Place 2 sprays into both nostrils 2 (two) times daily. Kasa, Kurian, MD 08/07/2024 Morning Active Self, Spouse/Significant Other, Pharmacy Records  budesonide -glycopyrrolate -formoterol  (BREZTRI  AEROSPHERE) 160-9-4.8 MCG/ACT AERO inhaler [id number]  Inhale 2 puffs into the lungs 2 (two) times daily. Avelina Greig BRAVO, MD  Active Self, Spouse/Significant Other, Pharmacy Records           Med Note LAVERNA MARDY SAILOR   Dju Aug 08, 2024  8:58 AM) Duplicate active refills  Cholecalciferol  (VITAMIN D ) 125 MCG (5000 UT) CAPS [id number] No Take 5,000 Units by mouth daily. [provider] 07/31/2024 Active Self, Spouse/Significant Other, Pharmacy Records  clopidogrel  (PLAVIX ) 75 MG tablet [id number] No TAKE 1 TABLET EVERY DAY Dew, Selinda RAMAN, MD 08/07/2024 Morning Active Self, Spouse/Significant Other, Pharmacy Records  doxycycline  (VIBRA -TABS) 100 MG tablet [id number]  Take 1 tablet (100 mg total) by mouth every 12 (twelve) hours for 5 days. Caleen Qualia, MD  Active   guaiFENesin  (MUCINEX ) 600 MG 12 hr tablet [id number] No Take 600 mg by mouth 2 (two) times daily. [provider] 08/07/2024 Morning Active Spouse/Significant Other, Self, Pharmacy Records           Med Note LAVERNA MARDY SAILOR   Sat Aug 08, 2024  9:00 AM)    melatonin 5 MG TABS [id number] No Take 1 tablet (5 mg total) by mouth at bedtime. Laurita Pillion, MD 08/06/2024 Active Self, Spouse/Significant Other, Pharmacy Records  metoprolol  tartrate (LOPRESSOR ) 25 MG tablet [id number]  Take 1/2 tablets (12.5 mg total) by mouth 2 (two) times daily. Caleen Qualia, MD  Active   Multiple Vitamins-Minerals (PRESERVISION AREDS) TABS [id number] No Take 1 tablet by  mouth 2 (two) times daily. [provider] 08/07/2024 Morning Active Self, Spouse/Significant Other, Pharmacy Records  OXYGEN   [id number]  Inhale 4 L into the lungs daily in the afternoon.  Patient taking differently: Inhale 4 L into the lungs daily in the afternoon. States worsening SOB and HHC nurse told them to increase from 2 to 3L  Currently O2 2-3L   [provider]  Active Self, Spouse/Significant Other, Pharmacy Records  pantoprazole  (PROTONIX ) 40 MG tablet [id number] No Take 1 tablet (40 mg total) by mouth daily.  Patient not taking: Reported on 04/28/2024   Laurita Pillion, MD Not Taking Active Self, Spouse/Significant Other, Pharmacy Records  [employer] [facility] Sprint Nebulizer Set MISC [id number]   [provider]  Active Self, Spouse/Significant Other, Pharmacy Records  polyethylene glycol (MIRALAX  / GLYCOLAX ) 17 g packet [id number]  Take 17 g by mouth daily. Laurita Pillion, MD  Active Self, Spouse/Significant Other, Pharmacy Records  predniSONE  (DELTASONE ) 10 MG tablet [id number]  Take 4 tablets (40 mg total) by mouth daily for next 4 days, decrease by 1 tablet every third day until you finish all the course. Caleen Qualia, MD  Active   roflumilast  (DALIRESP ) 500 MCG TABS tablet [id number] No Take 1 tablet (500 mcg total) by mouth daily. Kasa, Kurian, MD 08/07/2024 Morning Active Self, Spouse/Significant Other, Pharmacy Records  senna-docusate (SENOKOT-S) 8.6-50 MG tablet [id number] No Take 1 tablet by mouth 2 (two) times daily as needed for mild constipation. Laurita Pillion, MD 08/07/2024 Morning Active Self, Spouse/Significant Other, Pharmacy Records  traZODone  (DESYREL ) 50 MG tablet [id number] No TAKE 0.5-1 TABLETS BY MOUTH AT BEDTIME AS NEEDED FOR SLEEP. Avelina Greig BRAVO, MD Past Month Active Self, Spouse/Significant Other, Pharmacy Records           Med Note LAVERNA MARDY SAILOR   Sat Aug 08, 2024  9:04 AM) prn  verapamil  (VERELAN  PM) 360 MG 24 hr capsule [id number] No Take 360 mg by mouth daily. [provider] 08/07/2024 Morning Active Self, Spouse/Significant Other, Pharmacy Records  Vitamin E 180 MG CAPS  [id number] No Take 180 mg by mouth daily. [provider] 08/07/2024 Morning Active Self, Spouse/Significant Other, Pharmacy Records  Wound Dressings [facility] 4X4) PADS [id number] No Apply to knee ulcer.  Patient not taking: Reported on 05/06/2024   Avelina Greig BRAVO, MD Not Taking Active Self, Spouse/Significant Other, Pharmacy Records            Home Care and Equipment/Supplies: Were Home Health Services Ordered?: NA Any new equipment or medical supplies ordered?: NA  Functional Questionnaire: Do you need assistance with bathing/showering or dressing?: No Do you need assistance with meal preparation?: No Do you need assistance with eating?: No Do you have difficulty maintaining continence: No Do you need assistance with getting out of bed/getting out of a chair/moving?: No Do you have difficulty managing or taking your medications?: No  Follow up appointments reviewed: PCP Follow-up appointment confirmed?: Yes Date of PCP follow-up appointment?: 08/14/24 Follow-up Provider: Dr. Avelina Specialist [facility] Follow-up appointment confirmed?: Yes Follow-Up Specialty Provider:: Pulmonology- Dr. Isaiah; Cardiology- [facility] (09/29/24 @ 10:30) Do you need transportation to your follow-up appointment?: No Do you understand care options if your condition(s) worsen?: Yes-patient verbalized understanding    SIGNATURE Charmaine Bloodgood, LPN [facility] Health Advisor [facility] You Are. We Are. [facility] Direct Dial [phone]  "

## 2024-08-10 NOTE — Telephone Encounter (Signed)
 We can get her in for a posthospital visit with either me or nurse practitioner.

## 2024-08-14 ENCOUNTER — Ambulatory Visit: Admitting: Family Medicine

## 2024-08-14 VITALS — BP 124/60 | HR 65 | Temp 99.1°F | Ht 61.0 in | Wt 92.0 lb

## 2024-08-14 DIAGNOSIS — J9611 Chronic respiratory failure with hypoxia: Secondary | ICD-10-CM

## 2024-08-14 DIAGNOSIS — J441 Chronic obstructive pulmonary disease with (acute) exacerbation: Secondary | ICD-10-CM

## 2024-08-14 DIAGNOSIS — I10 Essential (primary) hypertension: Secondary | ICD-10-CM

## 2024-08-14 NOTE — Progress Notes (Signed)
 "   Patient ID: Misty Ferguson, female    DOB: 1933/11/01, 89 y.o.   MRN: [id number]  This visit was conducted in person.  BP 124/60   Pulse 65   Temp 99.1 F (37.3 C) (Temporal)   Ht 5' 1 (1.549 m)   Wt 92 lb (41.7 kg)   SpO2 99% Comment: 3 L O2 Nasal Canula  BMI 17.38 kg/m    CC:  Chief Complaint  Patient presents with   Hospitalization Follow-up    Subjective:   HPI: Misty Ferguson is a 89 y.o. female presenting on 08/14/2024 for Hospitalization Follow-up   Admission 12/26 to 12/27 presented with worsening of cough wheezing shortness of breath.  Patient was having symptoms for more than 3 weeks, received 2 courses of p.o. prednisone  with no significant improvement.   Presented with worsening of cough wheezing shortness of breath.  Patient was having symptoms for more than 3 weeks, received 2 courses of p.o. prednisone  with no significant improvement. Chest x-ray with no significant infiltrate, did show a new left-sided pleural effusion.   Patient was given a little longer tapering course of prednisone  as she already received 2 short courses.  She was also given 5-day course of doxycycline  for concern of COPD exacerbation.  Added low-dose metoprolol  for better control of heart rate along with her home verapamil .   BP Readings from Last 3 Encounters:  08/18/24 (!) 152/74  08/14/24 124/60  08/08/24 (!) 144/59    Relevant past medical, surgical, family and social history reviewed and updated as indicated. Interim medical history since our last visit reviewed. Allergies and medications reviewed and updated. Outpatient Medications Prior to Visit  Medication Sig Dispense Refill   albuterol  (PROVENTIL ) (2.5 MG/3ML) 0.083% nebulizer solution INHALE THE CONTENTS OF 1 VIAL VIA NEBULIZER EVERY 4 HOURS AS NEEDED FOR WHEEZING OR SHORTNESS OF BREATH. 450 mL 10   albuterol  (VENTOLIN  HFA) 108 (90 Base) MCG/ACT inhaler Inhale 2 puffs into the lungs every 6 (six) hours as needed for  wheezing or shortness of breath. 8 g 6   ascorbic acid (VITAMIN C) 1000 MG tablet Take 1,000 mg by mouth 2 (two) times daily.     aspirin  EC 81 MG tablet Take 81 mg by mouth daily. Swallow whole.     atorvastatin  (LIPITOR) 10 MG tablet TAKE 1 TABLET EVERY DAY 90 tablet 3   Azelastine  HCl 137 MCG/SPRAY SOLN Place 2 sprays into both nostrils 2 (two) times daily. 100 mL 5   budesonide -glycopyrrolate -formoterol  (BREZTRI  AEROSPHERE) 160-9-4.8 MCG/ACT AERO inhaler Inhale 2 puffs into the lungs 2 (two) times daily. 3 each 3   clopidogrel  (PLAVIX ) 75 MG tablet TAKE 1 TABLET EVERY DAY 90 tablet 3   guaiFENesin  (MUCINEX ) 600 MG 12 hr tablet Take 600 mg by mouth 2 (two) times daily.     melatonin 5 MG TABS Take 1 tablet (5 mg total) by mouth at bedtime. 30 tablet 0   metoprolol  tartrate (LOPRESSOR ) 25 MG tablet Take 1/2 tablets (12.5 mg total) by mouth 2 (two) times daily. 30 tablet 0   Multiple Vitamins-Minerals (PRESERVISION AREDS) TABS Take 1 tablet by mouth 2 (two) times daily.     OXYGEN  Inhale 2-3 L into the lungs daily in the afternoon.     [facility] Sprint Nebulizer Set MISC      polyethylene glycol (MIRALAX  / GLYCOLAX ) 17 g packet Take 17 g by mouth daily. 14 each 0   predniSONE  (DELTASONE ) 10 MG tablet Take 4 tablets (40  mg total) by mouth daily for next 4 days, decrease by 1 tablet every third day until you finish all the course. (Patient not taking: Reported on 08/18/2024) 40 tablet 0   roflumilast  (DALIRESP ) 500 MCG TABS tablet Take 1 tablet (500 mcg total) by mouth daily. 90 tablet 3   senna-docusate (SENOKOT-S) 8.6-50 MG tablet Take 1 tablet by mouth 2 (two) times daily as needed for mild constipation. 60 tablet 0   traZODone  (DESYREL ) 50 MG tablet TAKE 0.5-1 TABLETS BY MOUTH AT BEDTIME AS NEEDED FOR SLEEP. 30 tablet 3   verapamil  (VERELAN  PM) 360 MG 24 hr capsule Take 360 mg by mouth daily.     Vitamin D , Ergocalciferol , (DRISDOL ) 1.25 MG (50000 UNIT) CAPS capsule Take 50,000 Units by mouth every  7 (seven) days.     Vitamin E 180 MG CAPS Take 180 mg by mouth daily.     Cholecalciferol  (VITAMIN D ) 125 MCG (5000 UT) CAPS Take 5,000 Units by mouth daily.     pantoprazole  (PROTONIX ) 40 MG tablet Take 1 tablet (40 mg total) by mouth daily. (Patient not taking: Reported on 04/28/2024) 14 tablet 0   Wound Dressings (HYDROFERA BLUE 4X4) PADS Apply to knee ulcer. (Patient not taking: Reported on 05/06/2024) 5 each 0   No facility-administered medications prior to visit.     Per HPI unless specifically indicated in ROS section below Review of Systems  Constitutional:  Negative for fatigue and fever.  HENT:  Negative for congestion.   Eyes:  Negative for pain.  Respiratory:  Positive for cough and shortness of breath.   Cardiovascular:  Negative for chest pain, palpitations and leg swelling.  Gastrointestinal:  Negative for abdominal pain.  Genitourinary:  Negative for dysuria and vaginal bleeding.  Musculoskeletal:  Negative for back pain.  Neurological:  Negative for syncope, light-headedness and headaches.  Psychiatric/Behavioral:  Negative for dysphoric mood.    Objective:  BP 124/60   Pulse 65   Temp 99.1 F (37.3 C) (Temporal)   Ht 5' 1 (1.549 m)   Wt 92 lb (41.7 kg)   SpO2 99% Comment: 3 L O2 Nasal Canula  BMI 17.38 kg/m   Wt Readings from Last 3 Encounters:  08/18/24 92 lb (41.7 kg)  08/14/24 92 lb (41.7 kg)  08/07/24 92 lb (41.7 kg)      Physical Exam Constitutional:      General: She is not in acute distress.    Appearance: Normal appearance. She is well-developed. She is not ill-appearing or toxic-appearing.  HENT:     Head: Normocephalic.     Right Ear: Hearing, tympanic membrane, ear canal and external ear normal. Tympanic membrane is not erythematous, retracted or bulging.     Left Ear: Hearing, tympanic membrane, ear canal and external ear normal. Tympanic membrane is not erythematous, retracted or bulging.     Nose: No mucosal edema or rhinorrhea.      Right Sinus: No maxillary sinus tenderness or frontal sinus tenderness.     Left Sinus: No maxillary sinus tenderness or frontal sinus tenderness.     Mouth/Throat:     Pharynx: Uvula midline.  Eyes:     General: Lids are normal. Lids are everted, no foreign bodies appreciated.     Conjunctiva/sclera: Conjunctivae normal.     Pupils: Pupils are equal, round, and reactive to light.  Neck:     Thyroid : No thyroid  mass or thyromegaly.     Vascular: No carotid bruit.     Trachea: Trachea normal.  Cardiovascular:     Rate and Rhythm: Normal rate and regular rhythm.     Pulses: Normal pulses.     Heart sounds: Normal heart sounds, S1 normal and S2 normal. No murmur heard.    No friction rub. No gallop.  Pulmonary:     Effort: Pulmonary effort is normal. No tachypnea or respiratory distress.     Breath sounds: Decreased breath sounds present. No wheezing, rhonchi or rales.     Comments:  No focal changes, stable at baseline Abdominal:     General: Bowel sounds are normal.     Palpations: Abdomen is soft.     Tenderness: There is no abdominal tenderness.  Musculoskeletal:     Cervical back: Normal range of motion and neck supple.  Skin:    General: Skin is warm and dry.     Findings: No rash.  Neurological:     Mental Status: She is alert.  Psychiatric:        Mood and Affect: Mood is not anxious or depressed.        Speech: Speech normal.        Behavior: Behavior normal. Behavior is cooperative.        Thought Content: Thought content normal.        Judgment: Judgment normal.       Results for orders placed or performed during the hospital encounter of 08/07/24  Resp panel by RT-PCR (RSV, Flu A&B, Covid) Anterior Nasal Swab   Collection Time: 08/07/24  1:28 PM   Specimen: Anterior Nasal Swab  Result Value Ref Range   SARS Coronavirus 2 by RT PCR NEGATIVE NEGATIVE   Influenza A by PCR NEGATIVE NEGATIVE   Influenza B by PCR NEGATIVE NEGATIVE   Resp Syncytial Virus by PCR  NEGATIVE NEGATIVE  Blood gas, venous   Collection Time: 08/07/24  1:28 PM  Result Value Ref Range   pH, Ven 7.37 7.25 - 7.43   pCO2, Ven 49 44 - 60 mmHg   pO2, Ven 60 (H) 32 - 45 mmHg   Bicarbonate 28.3 (H) 20.0 - 28.0 mmol/L   Acid-Base Excess 2.2 (H) 0.0 - 2.0 mmol/L   O2 Saturation 88.4 %   Patient temperature 37.0    Collection site VEIN   CBC with Differential   Collection Time: 08/07/24  1:28 PM  Result Value Ref Range   WBC 10.4 4.0 - 10.5 K/uL   RBC 3.76 (L) 3.87 - 5.11 MIL/uL   Hemoglobin 11.3 (L) 12.0 - 15.0 g/dL   HCT 64.8 (L) 63.9 - 53.9 %   MCV 93.4 80.0 - 100.0 fL   MCH 30.1 26.0 - 34.0 pg   MCHC 32.2 30.0 - 36.0 g/dL   RDW 86.7 88.4 - 84.4 %   Platelets 383 150 - 400 K/uL   nRBC 0.0 0.0 - 0.2 %   Neutrophils Relative % 74 %   Neutro Abs 7.6 1.7 - 7.7 K/uL   Lymphocytes Relative 16 %   Lymphs Abs 1.7 0.7 - 4.0 K/uL   Monocytes Relative 8 %   Monocytes Absolute 0.9 0.1 - 1.0 K/uL   Eosinophils Relative 2 %   Eosinophils Absolute 0.2 0.0 - 0.5 K/uL   Basophils Relative 0 %   Basophils Absolute 0.0 0.0 - 0.1 K/uL   Immature Granulocytes 0 %   Abs Immature Granulocytes 0.04 0.00 - 0.07 K/uL  Comprehensive metabolic panel   Collection Time: 08/07/24  1:28 PM  Result Value Ref Range  Sodium 142 135 - 145 mmol/L   Potassium 3.7 3.5 - 5.1 mmol/L   Chloride 102 98 - 111 mmol/L   CO2 26 22 - 32 mmol/L   Glucose, Bld 176 (H) 70 - 99 mg/dL   BUN 10 8 - 23 mg/dL   Creatinine, Ser 9.37 0.44 - 1.00 mg/dL   Calcium  9.5 8.9 - 10.3 mg/dL   Total Protein 6.7 6.5 - 8.1 g/dL   Albumin 4.3 3.5 - 5.0 g/dL   AST 22 15 - 41 U/L   ALT 22 0 - 44 U/L   Alkaline Phosphatase 108 38 - 126 U/L   Total Bilirubin 0.3 0.0 - 1.2 mg/dL   GFR, Estimated >39 >39 mL/min   Anion gap 15 5 - 15  Pro Brain natriuretic peptide   Collection Time: 08/07/24  1:28 PM  Result Value Ref Range   Pro Brain Natriuretic Peptide 201.0 <300.0 pg/mL  Protime-INR   Collection Time: 08/07/24  1:28 PM   Result Value Ref Range   Prothrombin Time 12.3 11.4 - 15.2 seconds   INR 0.9 0.8 - 1.2  APTT   Collection Time: 08/07/24  1:28 PM  Result Value Ref Range   aPTT 32 24 - 36 seconds  Troponin T, High Sensitivity   Collection Time: 08/07/24  1:28 PM  Result Value Ref Range   Troponin T High Sensitivity 19 0 - 19 ng/L  Respiratory (~20 pathogens) panel by PCR   Collection Time: 08/07/24  4:05 PM   Specimen: Nasopharyngeal Swab; Respiratory  Result Value Ref Range   Adenovirus NOT DETECTED NOT DETECTED   Coronavirus 229E NOT DETECTED NOT DETECTED   Coronavirus HKU1 NOT DETECTED NOT DETECTED   Coronavirus NL63 NOT DETECTED NOT DETECTED   Coronavirus OC43 NOT DETECTED NOT DETECTED   Metapneumovirus NOT DETECTED NOT DETECTED   Rhinovirus / Enterovirus NOT DETECTED NOT DETECTED   Influenza A NOT DETECTED NOT DETECTED   Influenza B NOT DETECTED NOT DETECTED   Parainfluenza Virus 1 NOT DETECTED NOT DETECTED   Parainfluenza Virus 2 NOT DETECTED NOT DETECTED   Parainfluenza Virus 3 NOT DETECTED NOT DETECTED   Parainfluenza Virus 4 NOT DETECTED NOT DETECTED   Respiratory Syncytial Virus NOT DETECTED NOT DETECTED   Bordetella pertussis NOT DETECTED NOT DETECTED   Bordetella Parapertussis NOT DETECTED NOT DETECTED   Chlamydophila pneumoniae NOT DETECTED NOT DETECTED   Mycoplasma pneumoniae NOT DETECTED NOT DETECTED  Troponin T, High Sensitivity   Collection Time: 08/07/24  4:05 PM  Result Value Ref Range   Troponin T High Sensitivity 21 (H) 0 - 19 ng/L  Procalcitonin   Collection Time: 08/08/24  9:32 AM  Result Value Ref Range   Procalcitonin 0.13 ng/mL  CBC   Collection Time: 08/08/24  9:32 AM  Result Value Ref Range   WBC 3.9 (L) 4.0 - 10.5 K/uL   RBC 3.51 (L) 3.87 - 5.11 MIL/uL   Hemoglobin 10.6 (L) 12.0 - 15.0 g/dL   HCT 67.8 (L) 63.9 - 53.9 %   MCV 91.5 80.0 - 100.0 fL   MCH 30.2 26.0 - 34.0 pg   MCHC 33.0 30.0 - 36.0 g/dL   RDW 86.7 88.4 - 84.4 %   Platelets 314 150 - 400  K/uL   nRBC 0.0 0.0 - 0.2 %  MRSA Next Gen by PCR, Nasal   Collection Time: 08/08/24 11:38 AM   Specimen: Nasal Mucosa; Nasal Swab  Result Value Ref Range   MRSA by PCR Next Gen NOT  DETECTED NOT DETECTED    Assessment and Plan  COPD exacerbation (HCC) Assessment & Plan: Acute, with some improvement after hospitalization. Continued underlying severe COPD with chronic hypoxemic respiratory failure.  Has planned follow-up with pulmonary.   Chronic hypoxemic respiratory failure (HCC) Assessment & Plan: Chronic, stable on 4 L oxygen    Essential hypertension, benign Assessment & Plan: Chronic, blood pressure well-controlled. Appears that metoprolol  low-dose was added for likely better control of heart rate given it was elevated in the hospital. She does not want to continue this unless it is necessary. Her heart rate is low normal now and she denies issues with palpitations or heart racing.  She would like to simplify her medication regimen is much as possible.  She does not see the benefit of this medicine. She can try a trial off of metoprolol  but I do recommend continuing verapamil  360 mg daily     No follow-ups on file.   Greig Ring, MD  "

## 2024-08-18 ENCOUNTER — Ambulatory Visit (INDEPENDENT_AMBULATORY_CARE_PROVIDER_SITE_OTHER): Admitting: Vascular Surgery

## 2024-08-18 ENCOUNTER — Encounter (INDEPENDENT_AMBULATORY_CARE_PROVIDER_SITE_OTHER)

## 2024-08-18 ENCOUNTER — Encounter (INDEPENDENT_AMBULATORY_CARE_PROVIDER_SITE_OTHER): Payer: Self-pay | Admitting: Vascular Surgery

## 2024-08-18 VITALS — BP 152/74 | HR 91 | Resp 22 | Ht 61.0 in | Wt 92.0 lb

## 2024-08-18 DIAGNOSIS — E7800 Pure hypercholesterolemia, unspecified: Secondary | ICD-10-CM

## 2024-08-18 DIAGNOSIS — I10 Essential (primary) hypertension: Secondary | ICD-10-CM

## 2024-08-18 DIAGNOSIS — I70222 Atherosclerosis of native arteries of extremities with rest pain, left leg: Secondary | ICD-10-CM

## 2024-08-18 DIAGNOSIS — J441 Chronic obstructive pulmonary disease with (acute) exacerbation: Secondary | ICD-10-CM | POA: Diagnosis not present

## 2024-08-18 NOTE — Progress Notes (Signed)
 "   MRN : [id number]  Misty Ferguson is a 89 y.o. (1934-01-17) female who presents with chief complaint of  Chief Complaint  Patient presents with   Follow-up    3-4 month follow up w/ ABI's  .  History of Present Illness:   Discussed the use of AI scribe software for clinical note transcription with the patient, who gave verbal consent to proceed.  History of Present Illness Misty Ferguson is an 89 year old female with left lower extremity ischemia status post revascularization who presents for follow-up of persistent left leg swelling and pain.  She has persistent swelling of the left lower extremity greater than the right. It is loose in the feet and more pronounced in the instep as the day progresses. Swelling improves in the morning with overnight elevation. She wears daily compression stockings with relief and avoids skin irritation due to fragile skin. She is mostly sedentary with prolonged dependent leg positioning and has difficulty using a wheelchair. The swelling causes mild discomfort from limb heaviness but not severe pain.  She has no recurrence of the severe pre-revascularization pain and no significant current lower extremity pain. She notes occasional right calf pain that she associates with minor trauma and not vascular disease.  She underwent revascularization for profound ischemia several months ago.  She continues low-dose aspirin  as prescribed as well as Plavix  and a statin agent, with a brief interruption during a recent hospitalization for respiratory issues.    Results ABIs today are 1.17 on the right and 1.24 on the left with brisk biphasic waveforms and normal digital pressures and waveforms bilaterally.  Current Outpatient Medications  Medication Sig Dispense Refill   albuterol  (PROVENTIL ) (2.5 MG/3ML) 0.083% nebulizer solution INHALE THE CONTENTS OF 1 VIAL VIA NEBULIZER EVERY 4 HOURS AS NEEDED FOR WHEEZING OR SHORTNESS OF BREATH. 450 mL 10    albuterol  (VENTOLIN  HFA) 108 (90 Base) MCG/ACT inhaler Inhale 2 puffs into the lungs every 6 (six) hours as needed for wheezing or shortness of breath. 8 g 6   ascorbic acid (VITAMIN C) 1000 MG tablet Take 1,000 mg by mouth 2 (two) times daily.     aspirin  EC 81 MG tablet Take 81 mg by mouth daily. Swallow whole.     atorvastatin  (LIPITOR) 10 MG tablet TAKE 1 TABLET EVERY DAY 90 tablet 3   Azelastine  HCl 137 MCG/SPRAY SOLN Place 2 sprays into both nostrils 2 (two) times daily. 100 mL 5   budesonide -glycopyrrolate -formoterol  (BREZTRI  AEROSPHERE) 160-9-4.8 MCG/ACT AERO inhaler Inhale 2 puffs into the lungs 2 (two) times daily. 3 each 3   clopidogrel  (PLAVIX ) 75 MG tablet TAKE 1 TABLET EVERY DAY 90 tablet 3   guaiFENesin  (MUCINEX ) 600 MG 12 hr tablet Take 600 mg by mouth 2 (two) times daily.     melatonin 5 MG TABS Take 1 tablet (5 mg total) by mouth at bedtime. 30 tablet 0   metoprolol  tartrate (LOPRESSOR ) 25 MG tablet Take 1/2 tablets (12.5 mg total) by mouth 2 (two) times daily. 30 tablet 0   Multiple Vitamins-Minerals (PRESERVISION AREDS) TABS Take 1 tablet by mouth 2 (two) times daily.     OXYGEN  Inhale 2-3 L into the lungs daily in the afternoon.     Pari LC Sprint Nebulizer Set MISC      polyethylene glycol (MIRALAX  / GLYCOLAX ) 17 g packet Take 17 g by mouth daily. 14 each 0   roflumilast  (DALIRESP ) 500 MCG TABS tablet Take 1 tablet (500 mcg total)  by mouth daily. 90 tablet 3   senna-docusate (SENOKOT-S) 8.6-50 MG tablet Take 1 tablet by mouth 2 (two) times daily as needed for mild constipation. 60 tablet 0   traZODone  (DESYREL ) 50 MG tablet TAKE 0.5-1 TABLETS BY MOUTH AT BEDTIME AS NEEDED FOR SLEEP. 30 tablet 3   verapamil  (VERELAN  PM) 360 MG 24 hr capsule Take 360 mg by mouth daily.     Vitamin D , Ergocalciferol , (DRISDOL ) 1.25 MG (50000 UNIT) CAPS capsule Take 50,000 Units by mouth every 7 (seven) days.     Vitamin E 180 MG CAPS Take 180 mg by mouth daily.     predniSONE  (DELTASONE ) 10 MG  tablet Take 4 tablets (40 mg total) by mouth daily for next 4 days, decrease by 1 tablet every third day until you finish all the course. (Patient not taking: Reported on 08/18/2024) 40 tablet 0   No current facility-administered medications for this visit.    Past Medical History:  Diagnosis Date   AK (actinic keratosis) 12/08/2020   right pretibia inferior bx proven, LN2 01/10/21   Burping    Chronic airway obstruction, not elsewhere classified    Dyspnea    Dysrhythmia    Macular degeneration (senile) of retina, unspecified    Malignant neoplasm of urinary bladder (HCC) 03/2019   Partial bladder resection and Rad tx's.    Obstructive chronic bronchitis with exacerbation (HCC)    Osteoporosis, unspecified    Oxygen  deficiency    Personal history of peptic ulcer disease    SCC (squamous cell carcinoma) 12/08/2020   right pretibia superior, EDC 01/10/21   SCC (squamous cell carcinoma) 07/03/2022   left medial calf, clear 09/25/22   SCC (squamous cell carcinoma) 11/22/2022   right thigh   treated with ED&C   Squamous cell carcinoma in situ (SCCIS) 07/03/2022   right pretibia sccis needs treatment with fluorouracil    Tobacco use disorder    Unspecified essential hypertension    Unspecified glaucoma(365.9)     Past Surgical History:  Procedure Laterality Date   CATARACT EXTRACTION W/ INTRAOCULAR LENS  IMPLANT, BILATERAL     CYSTOSCOPY WITH STENT PLACEMENT Left 05/22/2019   Procedure: CYSTOSCOPY WITH STENT PLACEMENT;  Surgeon: Francisca Redell BROCKS, MD;  Location: [facility];  Service: Urology;  Laterality: Left;   ELBOW FRACTURE SURGERY Left    LOWER EXTREMITY ANGIOGRAPHY Right 11/20/2021   Procedure: Lower Extremity Angiography;  Surgeon: Marea Selinda RAMAN, MD;  Location: [facility];  Service: Cardiovascular;  Laterality: Right;   LOWER EXTREMITY ANGIOGRAPHY Left 09/03/2022   Procedure: Lower Extremity Angiography;  Surgeon: Marea Selinda RAMAN, MD;  Location: [facility];  Service:  Cardiovascular;  Laterality: Left;   LOWER EXTREMITY ANGIOGRAPHY Left 06/03/2023   Procedure: Lower Extremity Angiography;  Surgeon: Marea Selinda RAMAN, MD;  Location: [facility];  Service: Cardiovascular;  Laterality: Left;   LOWER EXTREMITY ANGIOGRAPHY Left 03/16/2024   Procedure: Lower Extremity Angiography;  Surgeon: Marea Selinda RAMAN, MD;  Location: [facility];  Service: Cardiovascular;  Laterality: Left;   TRANSURETHRAL RESECTION OF BLADDER TUMOR N/A 05/22/2019   Procedure: TRANSURETHRAL RESECTION OF BLADDER TUMOR (TURBT);  Surgeon: Francisca Redell BROCKS, MD;  Location: [facility];  Service: Urology;  Laterality: N/A;     Social History[1]    Family History  Adopted: Yes     Allergies[2]    REVIEW OF SYSTEMS (Negative unless checked)   Constitutional: [] Weight loss  [] Fever  [] Chills Cardiac: [] Chest pain   [] Chest  pressure   [] Palpitations   [] Shortness of breath when laying flat   [x] Shortness of breath at rest   [x] Shortness of breath with exertion. Vascular:  [x] Pain in legs with walking   [] Pain in legs at rest   [] Pain in legs when laying flat   [x] Claudication   [] Pain in feet when walking  [x] Pain in feet at rest  [x] Pain in feet when laying flat   [] History of DVT   [] Phlebitis   [] Swelling in legs   [] Varicose veins   [] Non-healing ulcers Pulmonary:   [] Uses home oxygen    [] Productive cough   [] Hemoptysis   [] Wheeze  [x] COPD   [] Asthma Neurologic:  [] Dizziness  [] Blackouts   [] Seizures   [] History of stroke   [] History of TIA  [] Aphasia   [] Temporary blindness   [] Dysphagia   [] Weakness or numbness in arms   [] Weakness or numbness in legs Musculoskeletal:  [] Arthritis   [] Joint swelling   [] Joint pain   [] Low back pain Hematologic:  [x] Easy bruising  [] Easy bleeding   [] Hypercoagulable state   [] Anemic  [] Hepatitis Gastrointestinal:  [] Blood in stool   [] Vomiting blood  [] Gastroesophageal reflux/heartburn   [] Difficulty swallowing. Genitourinary:  [x] Chronic kidney  disease   [] Difficult urination  [] Frequent urination  [] Burning with urination   [] Blood in urine Skin:  [] Rashes   [] Ulcers   [] Wounds Psychological:  [] History of anxiety   []  History of major depression.  Physical Examination  BP (!) 152/74 (BP Location: Left Arm, Patient Position: Sitting, Cuff Size: Small)   Pulse 91   Resp (!) 22   Ht 5' 1 (1.549 m)   Wt 92 lb (41.7 kg)   BMI 17.38 kg/m  Gen:   NAD, frail and thin Head: [place]/AT, No temporalis wasting. Ear/Nose/Throat: Hearing grossly intact, nares w/o erythema or drainage Eyes: Conjunctiva clear. Sclera non-icteric Neck: Supple.  Trachea midline Pulmonary:  Good air movement, slightly tachypnic on supplemental oxygen  Cardiac: irregular Vascular:  Vessel Right Left  Radial Palpable Palpable                          PT Palpable Palpable  DP Palpable Palpable    Musculoskeletal: M/S 5/5 throughout.  No deformity or atrophy.  Diffuse bruising and stasis dermatitis changes present throughout both lower extremities.  1+ bilateral lower extremity edema. Neurologic: Sensation grossly intact in extremities.  Symmetrical.  Speech is fluent.  Psychiatric: Judgment intact, Mood & affect appropriate for pt's clinical situation. Dermatologic: No rashes or ulcers noted.  No cellulitis or open wounds.  Physical Exam     Labs Recent Results (from the past 2160 hours)  Blood gas, venous     Status: Abnormal   Collection Time: 08/07/24  1:28 PM  Result Value Ref Range   pH, Ven 7.37 7.25 - 7.43   pCO2, Ven 49 44 - 60 mmHg   pO2, Ven 60 (H) 32 - 45 mmHg   Bicarbonate 28.3 (H) 20.0 - 28.0 mmol/L   Acid-Base Excess 2.2 (H) 0.0 - 2.0 mmol/L   O2 Saturation 88.4 %   Patient temperature 37.0    Collection site VEIN     Comment: Performed at [facility], [address] [place], [place] [address]  CBC with Differential     Status: Abnormal   Collection Time: 08/07/24  1:28 PM  Result Value Ref Range   WBC 10.4 4.0 -  10.5 K/uL   RBC 3.76 (L) 3.87 - 5.11 MIL/uL  Hemoglobin 11.3 (L) 12.0 - 15.0 g/dL   HCT 64.8 (L) 63.9 - 53.9 %   MCV 93.4 80.0 - 100.0 fL   MCH 30.1 26.0 - 34.0 pg   MCHC 32.2 30.0 - 36.0 g/dL   RDW 86.7 88.4 - 84.4 %   Platelets 383 150 - 400 K/uL   nRBC 0.0 0.0 - 0.2 %   Neutrophils Relative % 74 %   Neutro Abs 7.6 1.7 - 7.7 K/uL   Lymphocytes Relative 16 %   Lymphs Abs 1.7 0.7 - 4.0 K/uL   Monocytes Relative 8 %   Monocytes Absolute 0.9 0.1 - 1.0 K/uL   Eosinophils Relative 2 %   Eosinophils Absolute 0.2 0.0 - 0.5 K/uL   Basophils Relative 0 %   Basophils Absolute 0.0 0.0 - 0.1 K/uL   Immature Granulocytes 0 %   Abs Immature Granulocytes 0.04 0.00 - 0.07 K/uL    Comment: Performed at [facility], [address] [place], [place] [address]  Comprehensive metabolic panel     Status: Abnormal   Collection Time: 08/07/24  1:28 PM  Result Value Ref Range   Sodium 142 135 - 145 mmol/L   Potassium 3.7 3.5 - 5.1 mmol/L   Chloride 102 98 - 111 mmol/L   CO2 26 22 - 32 mmol/L   Glucose, Bld 176 (H) 70 - 99 mg/dL    Comment: Glucose reference range applies only to samples taken after fasting for at least 8 hours.   BUN 10 8 - 23 mg/dL   Creatinine, Ser 9.37 0.44 - 1.00 mg/dL   Calcium  9.5 8.9 - 10.3 mg/dL   Total Protein 6.7 6.5 - 8.1 g/dL   Albumin 4.3 3.5 - 5.0 g/dL   AST 22 15 - 41 U/L    Comment: HEMOLYSIS AT THIS LEVEL MAY AFFECT RESULT   ALT 22 0 - 44 U/L   Alkaline Phosphatase 108 38 - 126 U/L   Total Bilirubin 0.3 0.0 - 1.2 mg/dL   GFR, Estimated >39 >39 mL/min    Comment: (NOTE) Calculated using the CKD-EPI Creatinine Equation (2021)    Anion gap 15 5 - 15    Comment: Performed at [facility], [address] [place], [place] [address]  Troponin T, High Sensitivity     Status: None   Collection Time: 08/07/24  1:28 PM  Result Value Ref Range   Troponin T High Sensitivity 19 0 - 19 ng/L    Comment: (NOTE) Biotin concentrations > 1000 ng/mL  falsely decrease TnT results.  Serial cardiac troponin measurements are suggested.  Refer to the Links section for chest pain algorithms and additional  guidance. Performed at [facility], [address] [place], [place] [address]   Pro Brain natriuretic peptide     Status: None   Collection Time: 08/07/24  1:28 PM  Result Value Ref Range   Pro Brain Natriuretic Peptide 201.0 <300.0 pg/mL    Comment: (NOTE) Age Group        Cut-Points    Interpretation  < 50 years     450 pg/mL       NT-proBNP > 450 pg/mL indicates                                ADHF is likely              50 to 75 years  900 pg/mL  NT-proBNP > 900 pg/mL indicates          ADHF is likely  > 75 years      1800 pg/mL     NT-proBNP > 1800 pg/mL indicates          ADHF is likely                           All ages    Results between       Indeterminate. Further clinical             300 and the cut-   information is needed to determine            point for age group   if ADHF is present.                                                             Elecsys proBNP II/ Elecsys proBNP II STAT           Cut-Point                       Interpretation  300 pg/mL                    NT-proBNP <300pg/mL indicates                             ADHF is not likely  Performed at [facility], [address] [place], [place] [address]   Resp panel by RT-PCR (RSV, Flu A&B, Covid) Anterior Nasal Swab     Status: None   Collection Time: 08/07/24  1:28 PM   Specimen: Anterior Nasal Swab  Result Value Ref Range   SARS Coronavirus 2 by RT PCR NEGATIVE NEGATIVE    Comment: (NOTE) SARS-CoV-2 target nucleic acids are NOT DETECTED.  The SARS-CoV-2 RNA is generally detectable in upper respiratory specimens during the acute phase of infection. The lowest concentration of SARS-CoV-2 viral copies this assay can detect is 138 copies/mL. A negative result does not preclude SARS-Cov-2 infection and should not be  used as the sole basis for treatment or other patient management decisions. A negative result may occur with  improper specimen collection/handling, submission of specimen other than nasopharyngeal swab, presence of viral mutation(s) within the areas targeted by this assay, and inadequate number of viral copies(<138 copies/mL). A negative result must be combined with clinical observations, patient history, and epidemiological information. The expected result is Negative.  Fact Sheet for Patients:  bloggercourse.com  Fact Sheet for Healthcare Providers:  seriousbroker.it  This test is no t yet approved or cleared by the [place]  FDA and  has been authorized for detection and/or diagnosis of SARS-CoV-2 by FDA under an Emergency Use Authorization (EUA). This EUA will remain  in effect (meaning this test can be used) for the duration of the COVID-19 declaration under Section 564(b)(1) of the Act, 21 U.S.C.section 360bbb-3(b)(1), unless the authorization is terminated  or revoked sooner.       Influenza A by PCR NEGATIVE NEGATIVE   Influenza B by PCR NEGATIVE NEGATIVE    Comment: (NOTE) The Xpert Xpress SARS-CoV-2/FLU/RSV plus assay is intended as an aid in  the diagnosis of influenza from Nasopharyngeal swab specimens and should not be used as a sole basis for treatment. Nasal washings and aspirates are unacceptable for Xpert Xpress SARS-CoV-2/FLU/RSV testing.  Fact Sheet for Patients: bloggercourse.com  Fact Sheet for Healthcare Providers: seriousbroker.it  This test is not yet approved or cleared by the [place]  FDA and has been authorized for detection and/or diagnosis of SARS-CoV-2 by FDA under an Emergency Use Authorization (EUA). This EUA will remain in effect (meaning this test can be used) for the duration of the COVID-19 declaration under Section 564(b)(1) of the  Act, 21 U.S.C. section 360bbb-3(b)(1), unless the authorization is terminated or revoked.     Resp Syncytial Virus by PCR NEGATIVE NEGATIVE    Comment: (NOTE) Fact Sheet for Patients: bloggercourse.com  Fact Sheet for Healthcare Providers: seriousbroker.it  This test is not yet approved or cleared by the [place]  FDA and has been authorized for detection and/or diagnosis of SARS-CoV-2 by FDA under an Emergency Use Authorization (EUA). This EUA will remain in effect (meaning this test can be used) for the duration of the COVID-19 declaration under Section 564(b)(1) of the Act, 21 U.S.C. section 360bbb-3(b)(1), unless the authorization is terminated or revoked.  Performed at [facility], [address] [place], [place] [address]   Protime-INR     Status: None   Collection Time: 08/07/24  1:28 PM  Result Value Ref Range   Prothrombin Time 12.3 11.4 - 15.2 seconds   INR 0.9 0.8 - 1.2    Comment: (NOTE) INR goal varies based on device and disease states. Performed at [facility], [address] [place], [place] [address]   APTT     Status: None   Collection Time: 08/07/24  1:28 PM  Result Value Ref Range   aPTT 32 24 - 36 seconds    Comment: Performed at [facility], [address] [place], [place] [address]  Troponin T, High Sensitivity     Status: Abnormal   Collection Time: 08/07/24  4:05 PM  Result Value Ref Range   Troponin T High Sensitivity 21 (H) 0 - 19 ng/L    Comment: (NOTE) Biotin concentrations > 1000 ng/mL falsely decrease TnT results.  Serial cardiac troponin measurements are suggested.  Refer to the Links section for chest pain algorithms and additional  guidance. Performed at [facility], [address] [place], [place] [address]   Respiratory (~20 pathogens) panel by PCR     Status: None   Collection Time: 08/07/24  4:05 PM   Specimen: Nasopharyngeal  Swab; Respiratory  Result Value Ref Range   Adenovirus NOT DETECTED NOT DETECTED   Coronavirus 229E NOT DETECTED NOT DETECTED    Comment: (NOTE) The Coronavirus on the Respiratory Panel, DOES NOT test for the novel  Coronavirus (2019 nCoV)    Coronavirus HKU1 NOT DETECTED NOT DETECTED   Coronavirus NL63 NOT DETECTED NOT DETECTED   Coronavirus OC43 NOT DETECTED NOT DETECTED   Metapneumovirus NOT DETECTED NOT DETECTED   Rhinovirus / Enterovirus NOT DETECTED NOT DETECTED   Influenza A NOT DETECTED NOT DETECTED   Influenza B NOT DETECTED NOT DETECTED   Parainfluenza Virus 1 NOT DETECTED NOT DETECTED   Parainfluenza Virus 2 NOT DETECTED NOT DETECTED   Parainfluenza Virus 3 NOT DETECTED NOT DETECTED   Parainfluenza Virus 4 NOT DETECTED NOT DETECTED   Respiratory Syncytial Virus NOT DETECTED NOT DETECTED   Bordetella pertussis NOT DETECTED NOT DETECTED   Bordetella Parapertussis NOT DETECTED  NOT DETECTED   Chlamydophila pneumoniae NOT DETECTED NOT DETECTED   Mycoplasma pneumoniae NOT DETECTED NOT DETECTED    Comment: Performed at [facility], [address], [place], [place] [address]  Procalcitonin     Status: None   Collection Time: 08/08/24  9:32 AM  Result Value Ref Range   Procalcitonin 0.13 ng/mL    Comment: (NOTE)   Sepsis PCT Algorithm          Lower Respiratory Tract Infection                                         PCT Algorithm -----------------------------------------------------------------  <0.5 ng/mL                    <0.10 ng/mL  Associated with low           Antibiotic therapy strongly   risk for progression          discouraged. Indicates absence   to severe sepsis              of bacteria infection  and/or septic shock             --------------------------------------------------------------  0.5-2.0 ng/mL                 0.10-0.25 ng/mL  Recommended to retest         Antibiotic therapy discouraged.  PCT within 6-24 hours         Bacterial infection  unlikely  ------------------------------------------------------------  >2 ng/mL                      0.26-0.50 ng/mL  Associated with high risk     Antibiotic therapy encouraged.  for progression to severe     Bacterial infection possible  sepsis/and or septic shock    ------------------------------                                 >0.50 ng/mL                                Antibiotic therapy strongly                                 encouraged.                                Suggestive of presence of                                 bacterial infection.                                 -------------------------------------------------------------------  < or = 0.50 ng/mL OR          < or = 0.25 OR 80% decrease in PCT  80% decrease in PCT           Antibiotic therapy   Antibiotic therapy may        may be discontinued  be discontinued  Performed at [facility], [address] [place], [place] [address]   CBC     Status: Abnormal   Collection Time: 08/08/24  9:32 AM  Result Value Ref Range   WBC 3.9 (L) 4.0 - 10.5 K/uL   RBC 3.51 (L) 3.87 - 5.11 MIL/uL   Hemoglobin 10.6 (L) 12.0 - 15.0 g/dL   HCT 67.8 (L) 63.9 - 53.9 %   MCV 91.5 80.0 - 100.0 fL   MCH 30.2 26.0 - 34.0 pg   MCHC 33.0 30.0 - 36.0 g/dL   RDW 86.7 88.4 - 84.4 %   Platelets 314 150 - 400 K/uL   nRBC 0.0 0.0 - 0.2 %    Comment: Performed at [facility], [address], [place], [place] [address]  MRSA Next Gen by PCR, Nasal     Status: None   Collection Time: 08/08/24 11:38 AM   Specimen: Nasal Mucosa; Nasal Swab  Result Value Ref Range   MRSA by PCR Next Gen NOT DETECTED NOT DETECTED    Comment: (NOTE) The GeneXpert MRSA Assay (FDA approved for NASAL specimens only), is one component of a comprehensive MRSA colonization surveillance program. It is not intended to diagnose MRSA infection nor to guide or monitor treatment for MRSA infections. Test performance is  not FDA approved in patients less than 72 years old. Performed at [facility], [address] [place], [place] [address]     Radiology US  Venous Img Lower Bilateral Result Date: 08/07/2024 CLINICAL DATA:  89 year old female with bilateral lower extremity swelling. EXAM: BILATERAL LOWER EXTREMITY VENOUS DOPPLER ULTRASOUND TECHNIQUE: Gray-scale sonography with graded compression, as well as color Doppler and duplex ultrasound were performed to evaluate the lower extremity deep venous systems from the level of the common femoral vein and including the common femoral, femoral, profunda femoral, popliteal and calf veins including the posterior tibial, peroneal and gastrocnemius veins when visible. The superficial great saphenous vein was also interrogated. Spectral Doppler was utilized to evaluate flow at rest and with distal augmentation maneuvers in the common femoral, femoral and popliteal veins. COMPARISON:  None Available. FINDINGS: RIGHT LOWER EXTREMITY Common Femoral Vein: No evidence of thrombus. Normal compressibility, respiratory phasicity and response to augmentation. Saphenofemoral Junction: No evidence of thrombus. Normal compressibility and flow on color Doppler imaging. Profunda Femoral Vein: No evidence of thrombus. Normal compressibility and flow on color Doppler imaging. Femoral Vein: No evidence of thrombus. Normal compressibility, respiratory phasicity and response to augmentation. Popliteal Vein: No evidence of thrombus. Normal compressibility, respiratory phasicity and response to augmentation. Calf Veins: No evidence of thrombus. Normal compressibility and flow on color Doppler imaging. Other Findings:  None. LEFT LOWER EXTREMITY Common Femoral Vein: No evidence of thrombus. Normal compressibility, respiratory phasicity and response to augmentation. Saphenofemoral Junction: No evidence of thrombus. Normal compressibility and flow on color Doppler imaging. Profunda Femoral Vein:  No evidence of thrombus. Normal compressibility and flow on color Doppler imaging. Femoral Vein: No evidence of thrombus. Normal compressibility, respiratory phasicity and response to augmentation. Popliteal Vein: No evidence of thrombus. Normal compressibility, respiratory phasicity and response to augmentation. Calf Veins: No evidence of thrombus. Normal compressibility and flow on color Doppler imaging. Other Findings:  None. IMPRESSION: No evidence of bilateral lower extremity deep venous thrombosis. Ester Sides, MD [facility] Radiology Electronically Signed   By: Ester Sides M.D.   On: 08/07/2024 15:38   DG Chest Portable 1 View Result Date: 08/07/2024 EXAM: 1 VIEW(S) XRAY OF  THE CHEST 08/07/2024 01:40:00 PM COMPARISON: 03/18/2024 and previous. CLINICAL HISTORY: sob FINDINGS: LUNGS AND PLEURA: New left pleural effusion with associated left basilar opacity. No pneumothorax. HEART AND MEDIASTINUM: Aortic atherosclerosis. BONES AND SOFT TISSUES: Remote left rib fractures. Chronic sclerotic focus in left humeral head, bone island. IMPRESSION: 1. New left pleural effusion with associated left basilar opacity. Electronically signed by: Dayne Hassell MD 08/07/2024 02:13 PM EST RP Workstation: [id number]    Assessment/Plan  No problem-specific Assessment & Plan notes found for this encounter.  Assessment & Plan Critical limb ischemia of left lower extremity Profound ischemia post-revascularization with stable perfusion and no acute limb threat. Continued antiplatelet therapy necessary.  Would continue aspirin  and Plavix . - ABIs today are 1.17 on the right and 1.24 on the left with brisk biphasic waveforms and normal digital pressures and waveforms bilaterally. - Scheduled vascular surgery follow-up in six months.  Lymphedema of left lower extremity Persistent lymphedema secondary to ischemic event and decreased mobility. Managed with compression and  elevation. No diuretic therapy due to renal and electrolyte risks. - Continued daily use of compression stockings. - Encouraged leg elevation. - Advised to monitor swelling and skin integrity. - Deferred diuretic therapy to primary care if needed.  Essential hypertension, benign blood pressure control important in reducing the progression of atherosclerotic disease. On appropriate oral medications.     COPD with acute exacerbation (HCC) On oxygen .  Poor candidate for general anesthesia or intubation at any point.   HYPERCHOLESTEROLEMIA lipid control important in reducing the progression of atherosclerotic disease. Continue statin therapy    [name], MD  08/18/2024 10:53 AM    This note was created with Dragon medical transcription system.  Any errors from dictation are purely unintentional     [1]  Social History Tobacco Use   Smoking status: Former    Current packs/day: 0.00    Average packs/day: 1.3 packs/day for 45.0 years (58.5 ttl pk-yrs)    Types: Cigarettes    Start date: 23    Quit date: 58    Years since quitting: 28.0    Passive exposure: Past   Smokeless tobacco: Never  Vaping Use   Vaping status: Never Used  Substance Use Topics   Alcohol use: Yes    Alcohol/week: 14.0 standard drinks of alcohol    Types: 14 Glasses of wine per week    Comment: 1x/day   Drug use: No  [2] No Known Allergies  "

## 2024-08-19 LAB — VAS US ABI WITH/WO TBI
Left ABI: 1.24
Right ABI: 1.17

## 2024-08-27 NOTE — Assessment & Plan Note (Signed)
Chronic, stable on 4 L oxygen ?

## 2024-08-27 NOTE — Assessment & Plan Note (Signed)
 Acute, with some improvement after hospitalization. Continued underlying severe COPD with chronic hypoxemic respiratory failure.  Has planned follow-up with pulmonary.

## 2024-08-27 NOTE — Assessment & Plan Note (Signed)
 Chronic, blood pressure well-controlled. Appears that metoprolol  low-dose was added for likely better control of heart rate given it was elevated in the hospital. She does not want to continue this unless it is necessary. Her heart rate is low normal now and she denies issues with palpitations or heart racing.  She would like to simplify her medication regimen is much as possible.  She does not see the benefit of this medicine. She can try a trial off of metoprolol  but I do recommend continuing verapamil  360 mg daily

## 2024-09-01 ENCOUNTER — Encounter: Payer: Self-pay | Admitting: Family Medicine

## 2024-09-01 NOTE — Telephone Encounter (Signed)
Does not need to continue 

## 2024-09-01 NOTE — Telephone Encounter (Signed)
 Per last office note:  Essential hypertension, benign Assessment & Plan: Chronic, blood pressure well-controlled. Appears that metoprolol  low-dose was added for likely better control of heart rate given it was elevated in the hospital. She does not want to continue this unless it is necessary. Her heart rate is low normal now and she denies issues with palpitations or heart racing.  She would like to simplify her medication regimen is much as possible.  She does not see the benefit of this medicine. She can try a trial off of metoprolol  but I do recommend continuing verapamil  360 mg daily

## 2024-09-03 ENCOUNTER — Encounter: Payer: Self-pay | Admitting: Podiatry

## 2024-09-03 ENCOUNTER — Ambulatory Visit: Admitting: Podiatry

## 2024-09-03 DIAGNOSIS — G6289 Other specified polyneuropathies: Secondary | ICD-10-CM | POA: Diagnosis not present

## 2024-09-03 DIAGNOSIS — I739 Peripheral vascular disease, unspecified: Secondary | ICD-10-CM

## 2024-09-03 DIAGNOSIS — M79676 Pain in unspecified toe(s): Secondary | ICD-10-CM

## 2024-09-03 DIAGNOSIS — B351 Tinea unguium: Secondary | ICD-10-CM

## 2024-09-03 NOTE — Progress Notes (Signed)
 Patient had atypical PNA like symptoms, and respiratory panel helped to decided whether to continue antibiotic treatment vs no antibiotics.

## 2024-09-07 NOTE — Progress Notes (Unsigned)
"  °  Subjective:  Patient ID: Misty Ferguson, female    DOB: 1933/10/02,  MRN: [id number]  Misty Ferguson presents to clinic today for at risk foot care. Patient has h/o PAD and neuropathy and painful mycotic toenails of both feet that are difficult to trim. Pain interferes with daily activities and wearing enclosed shoe gear comfortably.  Chief Complaint  Patient presents with   Nail Problem    Thick painful toenails, 6 month follow up    Diabetes    A1C  5.6   New problem(s): None.   PCP is [name], Amy E, MD.  Allergies[1]  Review of Systems: Negative except as noted in the HPI.  Objective: No changes noted in today's physical examination. There were no vitals filed for this visit. Misty Ferguson is a pleasant 89 y.o. female thin build in NAD. AAO x 3.  Vascular Examination: CFT <3 seconds b/l. DP/PT pulses faintly palpable b/l. Skin temperature gradient warm to warm b/l. No pain with calf compression. No ischemia or gangrene. No cyanosis or clubbing noted b/l. Evidence of skin changes consistent with long term venous stasis LLE.   Neurological Examination: Protective sensation diminished with 10g monofilament b/l.  Dermatological Examination: Pedal skin warm and supple b/l.   No open wounds. No interdigital macerations.  Toenails 1-5 b/l thick, discolored, elongated with subungual debris and pain on dorsal palpation.    No corns, calluses nor porokeratotic lesions noted.  Musculoskeletal Examination: Muscle strength 5/5 to all lower extremity muscle groups bilaterally. Hammertoe(s) bilateral 5th toes. Utilizes transport chair for ambulation assistance.  Radiographs: None  Assessment/Plan: 1. Pain due to onychomycosis of toenail   2. Other polyneuropathy   3. PVD (peripheral vascular disease)    Consent given for treatment. Patient examined. All patient's and/or POA's questions/concerns addressed on today's visit. Mycotic toenails 1-5 b/l debrided in length and girth  without incident. Continue soft, supportive shoe gear daily. Report any pedal injuries to medical professional. Call office if there are any quesitons/concerns. -Patient/POA to call should there be question/concern in the interim.   Return in about 4 months (around 01/01/2025).  Delon LITTIE Merlin, DPM      [place] LOCATION: [address] [place] [address]                   Office [phone]   [facility] LOCATION: [address] [place], [place] [address] Office [phone]     [1] No Known Allergies  "

## 2024-09-11 ENCOUNTER — Encounter: Payer: Self-pay | Admitting: Internal Medicine

## 2024-09-11 NOTE — Telephone Encounter (Signed)
 She should take her prednisone  for situations like this as directed by Dr. Isaiah.

## 2024-10-20 ENCOUNTER — Ambulatory Visit: Admitting: Dermatology

## 2024-10-29 ENCOUNTER — Ambulatory Visit: Admitting: Internal Medicine

## 2024-11-18 ENCOUNTER — Other Ambulatory Visit

## 2024-11-25 ENCOUNTER — Encounter: Admitting: Family Medicine

## 2025-01-01 ENCOUNTER — Ambulatory Visit: Admitting: Podiatry

## 2025-01-26 ENCOUNTER — Ambulatory Visit

## 2025-02-16 ENCOUNTER — Encounter (INDEPENDENT_AMBULATORY_CARE_PROVIDER_SITE_OTHER)

## 2025-02-16 ENCOUNTER — Ambulatory Visit (INDEPENDENT_AMBULATORY_CARE_PROVIDER_SITE_OTHER): Admitting: Vascular Surgery
